# Patient Record
Sex: Female | Born: 1965 | Race: Black or African American | Hispanic: No | Marital: Single | Smoking: Former smoker
Health system: Southern US, Community
[De-identification: ages and names within clinical notes are randomized; demographics above are authoritative.]

## PROBLEM LIST (undated history)

## (undated) DIAGNOSIS — C719 Malignant neoplasm of brain, unspecified: Secondary | ICD-10-CM

## (undated) DIAGNOSIS — Z9221 Personal history of antineoplastic chemotherapy: Secondary | ICD-10-CM

## (undated) DIAGNOSIS — N390 Urinary tract infection, site not specified: Secondary | ICD-10-CM

## (undated) DIAGNOSIS — C50919 Malignant neoplasm of unspecified site of unspecified female breast: Principal | ICD-10-CM

## (undated) DIAGNOSIS — Z515 Encounter for palliative care: Secondary | ICD-10-CM

## (undated) DIAGNOSIS — C7951 Secondary malignant neoplasm of bone: Secondary | ICD-10-CM

## (undated) DIAGNOSIS — Z923 Personal history of irradiation: Secondary | ICD-10-CM

## (undated) DIAGNOSIS — N12 Tubulo-interstitial nephritis, not specified as acute or chronic: Secondary | ICD-10-CM

## (undated) DIAGNOSIS — K219 Gastro-esophageal reflux disease without esophagitis: Secondary | ICD-10-CM

## (undated) DIAGNOSIS — I10 Essential (primary) hypertension: Secondary | ICD-10-CM

## (undated) DIAGNOSIS — C787 Secondary malignant neoplasm of liver and intrahepatic bile duct: Principal | ICD-10-CM

## (undated) HISTORY — DX: Personal history of antineoplastic chemotherapy: Z92.21

## (undated) HISTORY — DX: Hypercalcemia: E83.52

## (undated) HISTORY — DX: Malignant neoplasm of unspecified site of unspecified female breast: C50.919

## (undated) HISTORY — DX: Malignant neoplasm of brain, unspecified: C71.9

## (undated) HISTORY — DX: Secondary malignant neoplasm of liver and intrahepatic bile duct: C78.7

## (undated) HISTORY — DX: Personal history of irradiation: Z92.3

## (undated) HISTORY — DX: Essential (primary) hypertension: I10

---

## 2001-05-29 ENCOUNTER — Emergency Department (HOSPITAL_COMMUNITY): Admission: EM | Admit: 2001-05-29 | Discharge: 2001-05-29 | Payer: Self-pay | Admitting: Emergency Medicine

## 2002-06-02 ENCOUNTER — Emergency Department (HOSPITAL_COMMUNITY): Admission: EM | Admit: 2002-06-02 | Discharge: 2002-06-02 | Payer: Self-pay | Admitting: Emergency Medicine

## 2002-06-02 ENCOUNTER — Encounter: Payer: Self-pay | Admitting: Emergency Medicine

## 2002-11-15 ENCOUNTER — Emergency Department (HOSPITAL_COMMUNITY): Admission: EM | Admit: 2002-11-15 | Discharge: 2002-11-16 | Payer: Self-pay | Admitting: Emergency Medicine

## 2003-08-09 ENCOUNTER — Emergency Department (HOSPITAL_COMMUNITY): Admission: EM | Admit: 2003-08-09 | Discharge: 2003-08-09 | Payer: Self-pay | Admitting: Family Medicine

## 2005-08-25 ENCOUNTER — Emergency Department (HOSPITAL_COMMUNITY): Admission: EM | Admit: 2005-08-25 | Discharge: 2005-08-25 | Payer: Self-pay | Admitting: Emergency Medicine

## 2007-11-05 ENCOUNTER — Inpatient Hospital Stay (HOSPITAL_COMMUNITY): Admission: AD | Admit: 2007-11-05 | Discharge: 2007-11-06 | Payer: Self-pay | Admitting: Obstetrics and Gynecology

## 2008-05-17 ENCOUNTER — Encounter (INDEPENDENT_AMBULATORY_CARE_PROVIDER_SITE_OTHER): Payer: Self-pay | Admitting: Obstetrics

## 2008-05-17 ENCOUNTER — Inpatient Hospital Stay (HOSPITAL_COMMUNITY): Admission: AD | Admit: 2008-05-17 | Discharge: 2008-05-20 | Payer: Self-pay | Admitting: Obstetrics

## 2008-06-01 HISTORY — PX: TUBAL LIGATION: SHX77

## 2008-08-28 ENCOUNTER — Emergency Department (HOSPITAL_COMMUNITY): Admission: EM | Admit: 2008-08-28 | Discharge: 2008-08-28 | Payer: Self-pay | Admitting: Family Medicine

## 2008-12-21 ENCOUNTER — Emergency Department (HOSPITAL_COMMUNITY): Admission: EM | Admit: 2008-12-21 | Discharge: 2008-12-21 | Payer: Self-pay | Admitting: Emergency Medicine

## 2008-12-24 ENCOUNTER — Emergency Department (HOSPITAL_COMMUNITY): Admission: EM | Admit: 2008-12-24 | Discharge: 2008-12-24 | Payer: Self-pay | Admitting: Emergency Medicine

## 2009-02-03 ENCOUNTER — Emergency Department (HOSPITAL_COMMUNITY): Admission: EM | Admit: 2009-02-03 | Discharge: 2009-02-03 | Payer: Self-pay | Admitting: Emergency Medicine

## 2009-02-27 ENCOUNTER — Emergency Department (HOSPITAL_COMMUNITY): Admission: EM | Admit: 2009-02-27 | Discharge: 2009-02-27 | Payer: Self-pay | Admitting: Family Medicine

## 2009-03-08 ENCOUNTER — Encounter: Admission: RE | Admit: 2009-03-08 | Discharge: 2009-03-08 | Payer: Self-pay | Admitting: Infectious Diseases

## 2009-03-14 ENCOUNTER — Encounter: Admission: RE | Admit: 2009-03-14 | Discharge: 2009-03-14 | Payer: Self-pay | Admitting: Infectious Diseases

## 2009-03-14 ENCOUNTER — Encounter (INDEPENDENT_AMBULATORY_CARE_PROVIDER_SITE_OTHER): Payer: Self-pay | Admitting: Diagnostic Radiology

## 2009-03-22 ENCOUNTER — Encounter: Admission: RE | Admit: 2009-03-22 | Discharge: 2009-03-22 | Payer: Self-pay | Admitting: Infectious Diseases

## 2009-03-28 ENCOUNTER — Ambulatory Visit: Payer: Self-pay | Admitting: Genetic Counselor

## 2009-04-29 ENCOUNTER — Encounter (INDEPENDENT_AMBULATORY_CARE_PROVIDER_SITE_OTHER): Payer: Self-pay | Admitting: General Surgery

## 2009-04-29 ENCOUNTER — Ambulatory Visit (HOSPITAL_COMMUNITY): Admission: RE | Admit: 2009-04-29 | Discharge: 2009-04-30 | Payer: Self-pay | Admitting: General Surgery

## 2009-05-13 ENCOUNTER — Ambulatory Visit: Payer: Self-pay | Admitting: Genetic Counselor

## 2009-06-01 HISTORY — PX: MASTECTOMY: SHX3

## 2009-06-01 HISTORY — PX: PORTACATH PLACEMENT: SHX2246

## 2009-06-01 HISTORY — PX: BREAST BIOPSY: SHX20

## 2009-06-12 ENCOUNTER — Ambulatory Visit: Payer: Self-pay | Admitting: Oncology

## 2009-06-12 LAB — COMPREHENSIVE METABOLIC PANEL
AST: 18 U/L (ref 0–37)
Albumin: 3.9 g/dL (ref 3.5–5.2)
Alkaline Phosphatase: 92 U/L (ref 39–117)
Chloride: 106 mEq/L (ref 96–112)
Glucose, Bld: 82 mg/dL (ref 70–99)
Potassium: 3.3 mEq/L — ABNORMAL LOW (ref 3.5–5.3)
Total Bilirubin: 0.7 mg/dL (ref 0.3–1.2)
Total Protein: 6.9 g/dL (ref 6.0–8.3)

## 2009-06-12 LAB — CBC WITH DIFFERENTIAL/PLATELET
BASO%: 0.4 % (ref 0.0–2.0)
Eosinophils Absolute: 0.1 10*3/uL (ref 0.0–0.5)
LYMPH%: 44.3 % (ref 14.0–49.7)
MCH: 27.6 pg (ref 25.1–34.0)
MCHC: 33.3 g/dL (ref 31.5–36.0)
MONO%: 7.4 % (ref 0.0–14.0)
RBC: 4.38 10*6/uL (ref 3.70–5.45)

## 2009-06-24 ENCOUNTER — Ambulatory Visit (HOSPITAL_COMMUNITY): Admission: RE | Admit: 2009-06-24 | Discharge: 2009-06-24 | Payer: Self-pay | Admitting: Oncology

## 2009-06-25 ENCOUNTER — Encounter: Payer: Self-pay | Admitting: Oncology

## 2009-06-25 ENCOUNTER — Ambulatory Visit: Admission: RE | Admit: 2009-06-25 | Discharge: 2009-06-25 | Payer: Self-pay | Admitting: Oncology

## 2009-07-17 ENCOUNTER — Encounter (INDEPENDENT_AMBULATORY_CARE_PROVIDER_SITE_OTHER): Payer: Self-pay | Admitting: General Surgery

## 2009-07-17 ENCOUNTER — Ambulatory Visit (HOSPITAL_COMMUNITY): Admission: RE | Admit: 2009-07-17 | Discharge: 2009-07-18 | Payer: Self-pay | Admitting: General Surgery

## 2009-07-26 ENCOUNTER — Ambulatory Visit: Payer: Self-pay | Admitting: Oncology

## 2009-10-15 ENCOUNTER — Ambulatory Visit: Payer: Self-pay | Admitting: Oncology

## 2009-11-29 ENCOUNTER — Ambulatory Visit: Payer: Self-pay | Admitting: Oncology

## 2009-11-29 LAB — CBC WITH DIFFERENTIAL/PLATELET
BASO%: 0.5 % (ref 0.0–2.0)
EOS%: 1.1 % (ref 0.0–7.0)
MCH: 27.7 pg (ref 25.1–34.0)
MCHC: 33.6 g/dL (ref 31.5–36.0)
MCV: 82.4 fL (ref 79.5–101.0)
NEUT#: 4.1 10*3/uL (ref 1.5–6.5)
RBC: 4.08 10*6/uL (ref 3.70–5.45)
RDW: 16 % — ABNORMAL HIGH (ref 11.2–14.5)

## 2009-11-29 LAB — CANCER ANTIGEN 27.29: CA 27.29: 9 U/mL (ref 0–39)

## 2009-11-29 LAB — COMPREHENSIVE METABOLIC PANEL
ALT: 17 U/L (ref 0–35)
Albumin: 4.2 g/dL (ref 3.5–5.2)
CO2: 19 mEq/L (ref 19–32)
Glucose, Bld: 103 mg/dL — ABNORMAL HIGH (ref 70–99)
Potassium: 3.5 mEq/L (ref 3.5–5.3)
Sodium: 138 mEq/L (ref 135–145)
Total Protein: 6.9 g/dL (ref 6.0–8.3)

## 2009-12-09 ENCOUNTER — Encounter: Admission: RE | Admit: 2009-12-09 | Discharge: 2010-02-13 | Payer: Self-pay | Admitting: Oncology

## 2009-12-10 LAB — CBC WITH DIFFERENTIAL/PLATELET
BASO%: 1.1 % (ref 0.0–2.0)
EOS%: 0.6 % (ref 0.0–7.0)
HCT: 36.2 % (ref 34.8–46.6)
LYMPH%: 21.1 % (ref 14.0–49.7)
MCH: 27.2 pg (ref 25.1–34.0)
MCHC: 32.5 g/dL (ref 31.5–36.0)
MONO#: 0.2 10*3/uL (ref 0.1–0.9)
NEUT%: 73.5 % (ref 38.4–76.8)
Platelets: 362 10*3/uL (ref 145–400)
RBC: 4.33 10*6/uL (ref 3.70–5.45)
WBC: 6.3 10*3/uL (ref 3.9–10.3)
lymph#: 1.3 10*3/uL (ref 0.9–3.3)

## 2009-12-10 LAB — COMPREHENSIVE METABOLIC PANEL
ALT: 8 U/L (ref 0–35)
AST: 14 U/L (ref 0–37)
CO2: 24 mEq/L (ref 19–32)
Creatinine, Ser: 0.79 mg/dL (ref 0.40–1.20)
Sodium: 141 mEq/L (ref 135–145)
Total Bilirubin: 0.4 mg/dL (ref 0.3–1.2)
Total Protein: 7.4 g/dL (ref 6.0–8.3)

## 2009-12-17 LAB — CBC WITH DIFFERENTIAL/PLATELET
BASO%: 0.1 % (ref 0.0–2.0)
Basophils Absolute: 0 10*3/uL (ref 0.0–0.1)
EOS%: 0.1 % (ref 0.0–7.0)
HCT: 32 % — ABNORMAL LOW (ref 34.8–46.6)
HGB: 10.9 g/dL — ABNORMAL LOW (ref 11.6–15.9)
LYMPH%: 7.7 % — ABNORMAL LOW (ref 14.0–49.7)
MCH: 28.2 pg (ref 25.1–34.0)
MCHC: 34.1 g/dL (ref 31.5–36.0)
MCV: 82.5 fL (ref 79.5–101.0)
MONO%: 1 % (ref 0.0–14.0)
NEUT%: 91.1 % — ABNORMAL HIGH (ref 38.4–76.8)
Platelets: 215 10*3/uL (ref 145–400)

## 2009-12-23 LAB — CBC WITH DIFFERENTIAL/PLATELET
BASO%: 0.4 % (ref 0.0–2.0)
Basophils Absolute: 0 10*3/uL (ref 0.0–0.1)
EOS%: 0.3 % (ref 0.0–7.0)
HGB: 11.5 g/dL — ABNORMAL LOW (ref 11.6–15.9)
MCH: 27.9 pg (ref 25.1–34.0)
MCHC: 33.7 g/dL (ref 31.5–36.0)
MCV: 82.7 fL (ref 79.5–101.0)
MONO%: 3.4 % (ref 0.0–14.0)
RBC: 4.14 10*6/uL (ref 3.70–5.45)
RDW: 16.1 % — ABNORMAL HIGH (ref 11.2–14.5)
lymph#: 1.4 10*3/uL (ref 0.9–3.3)

## 2009-12-30 ENCOUNTER — Ambulatory Visit: Payer: Self-pay | Admitting: Oncology

## 2010-01-01 LAB — CBC WITH DIFFERENTIAL/PLATELET
Basophils Absolute: 0 10*3/uL (ref 0.0–0.1)
EOS%: 0.3 % (ref 0.0–7.0)
MONO#: 0.3 10*3/uL (ref 0.1–0.9)
NEUT#: 1.9 10*3/uL (ref 1.5–6.5)
NEUT%: 56.9 % (ref 38.4–76.8)
RBC: 3.76 10*6/uL (ref 3.70–5.45)
RDW: 16.9 % — ABNORMAL HIGH (ref 11.2–14.5)
lymph#: 1.2 10*3/uL (ref 0.9–3.3)

## 2010-01-06 ENCOUNTER — Ambulatory Visit (HOSPITAL_COMMUNITY): Admission: RE | Admit: 2010-01-06 | Discharge: 2010-01-06 | Payer: Self-pay | Admitting: Oncology

## 2010-01-07 LAB — CBC WITH DIFFERENTIAL/PLATELET
Eosinophils Absolute: 0 10*3/uL (ref 0.0–0.5)
HCT: 32.3 % — ABNORMAL LOW (ref 34.8–46.6)
MCH: 27 pg (ref 25.1–34.0)
MCV: 82.2 fL (ref 79.5–101.0)
NEUT#: 13.7 10*3/uL — ABNORMAL HIGH (ref 1.5–6.5)
Platelets: 202 10*3/uL (ref 145–400)
WBC: 16.4 10*3/uL — ABNORMAL HIGH (ref 3.9–10.3)

## 2010-01-15 LAB — URINALYSIS, MICROSCOPIC - CHCC
Bilirubin (Urine): NEGATIVE
Glucose: NEGATIVE g/dL
Specific Gravity, Urine: 1.01 (ref 1.003–1.035)

## 2010-01-15 LAB — CBC WITH DIFFERENTIAL/PLATELET
BASO%: 0.4 % (ref 0.0–2.0)
EOS%: 0.4 % (ref 0.0–7.0)
HGB: 10.9 g/dL — ABNORMAL LOW (ref 11.6–15.9)
LYMPH%: 19.4 % (ref 14.0–49.7)
MCH: 28.1 pg (ref 25.1–34.0)
NEUT#: 5.2 10*3/uL (ref 1.5–6.5)
Platelets: 417 10*3/uL — ABNORMAL HIGH (ref 145–400)
RDW: 20.1 % — ABNORMAL HIGH (ref 11.2–14.5)
WBC: 7.4 10*3/uL (ref 3.9–10.3)
lymph#: 1.4 10*3/uL (ref 0.9–3.3)

## 2010-01-17 LAB — URINE CULTURE

## 2010-01-28 LAB — CBC WITH DIFFERENTIAL/PLATELET
Basophils Absolute: 0 10*3/uL (ref 0.0–0.1)
Eosinophils Absolute: 0.1 10*3/uL (ref 0.0–0.5)
HGB: 11.3 g/dL — ABNORMAL LOW (ref 11.6–15.9)
LYMPH%: 22.5 % (ref 14.0–49.7)
MCH: 27.5 pg (ref 25.1–34.0)
MCV: 83.5 fL (ref 79.5–101.0)
MONO#: 0.6 10*3/uL (ref 0.1–0.9)
NEUT#: 3.7 10*3/uL (ref 1.5–6.5)
NEUT%: 65.7 % (ref 38.4–76.8)
WBC: 5.6 10*3/uL (ref 3.9–10.3)

## 2010-02-04 ENCOUNTER — Ambulatory Visit: Payer: Self-pay | Admitting: Oncology

## 2010-02-11 LAB — COMPREHENSIVE METABOLIC PANEL
Alkaline Phosphatase: 74 U/L (ref 39–117)
Calcium: 8.8 mg/dL (ref 8.4–10.5)
Creatinine, Ser: 0.77 mg/dL (ref 0.40–1.20)
Potassium: 3.2 mEq/L — ABNORMAL LOW (ref 3.5–5.3)
Sodium: 142 mEq/L (ref 135–145)
Total Bilirubin: 0.3 mg/dL (ref 0.3–1.2)
Total Protein: 6.3 g/dL (ref 6.0–8.3)

## 2010-02-11 LAB — CBC WITH DIFFERENTIAL/PLATELET
EOS%: 0.9 % (ref 0.0–7.0)
Eosinophils Absolute: 0 10*3/uL (ref 0.0–0.5)
MONO%: 6.7 % (ref 0.0–14.0)
NEUT#: 3.1 10*3/uL (ref 1.5–6.5)

## 2010-02-18 LAB — CBC WITH DIFFERENTIAL/PLATELET
BASO%: 1.9 % (ref 0.0–2.0)
EOS%: 1.3 % (ref 0.0–7.0)
Eosinophils Absolute: 0 10*3/uL (ref 0.0–0.5)
HGB: 10.6 g/dL — ABNORMAL LOW (ref 11.6–15.9)
MCH: 28 pg (ref 25.1–34.0)
MCHC: 33 g/dL (ref 31.5–36.0)
MCV: 84.9 fL (ref 79.5–101.0)
MONO#: 0.2 10*3/uL (ref 0.1–0.9)
NEUT#: 2 10*3/uL (ref 1.5–6.5)
NEUT%: 62.3 % (ref 38.4–76.8)
RDW: 19.3 % — ABNORMAL HIGH (ref 11.2–14.5)
nRBC: 0 % (ref 0–0)

## 2010-02-25 LAB — CBC WITH DIFFERENTIAL/PLATELET
BASO%: 0.5 % (ref 0.0–2.0)
HCT: 33.4 % — ABNORMAL LOW (ref 34.8–46.6)
HGB: 11.2 g/dL — ABNORMAL LOW (ref 11.6–15.9)
LYMPH%: 13.8 % — ABNORMAL LOW (ref 14.0–49.7)
MCH: 28.4 pg (ref 25.1–34.0)
MCHC: 33.5 g/dL (ref 31.5–36.0)
NEUT#: 5.9 10*3/uL (ref 1.5–6.5)
Platelets: 222 10*3/uL (ref 145–400)
RBC: 3.95 10*6/uL (ref 3.70–5.45)
nRBC: 1 % — ABNORMAL HIGH (ref 0–0)

## 2010-03-07 ENCOUNTER — Ambulatory Visit: Payer: Self-pay | Admitting: Oncology

## 2010-03-18 LAB — CBC WITH DIFFERENTIAL/PLATELET
BASO%: 0.6 % (ref 0.0–2.0)
HGB: 11.5 g/dL — ABNORMAL LOW (ref 11.6–15.9)
MONO#: 0.4 10*3/uL (ref 0.1–0.9)
NEUT#: 2.8 10*3/uL (ref 1.5–6.5)
NEUT%: 58.2 % (ref 38.4–76.8)
Platelets: 343 10*3/uL (ref 145–400)
RBC: 4.07 10*6/uL (ref 3.70–5.45)
RDW: 19.1 % — ABNORMAL HIGH (ref 11.2–14.5)
lymph#: 1.5 10*3/uL (ref 0.9–3.3)

## 2010-03-25 LAB — CBC WITH DIFFERENTIAL/PLATELET
HGB: 11.1 g/dL — ABNORMAL LOW (ref 11.6–15.9)
LYMPH%: 26.1 % (ref 14.0–49.7)
MCH: 28.6 pg (ref 25.1–34.0)
MCHC: 33 g/dL (ref 31.5–36.0)
MCV: 86.6 fL (ref 79.5–101.0)
MONO#: 0.3 10*3/uL (ref 0.1–0.9)
MONO%: 6.7 % (ref 0.0–14.0)
Platelets: 269 10*3/uL (ref 145–400)
RDW: 18.2 % — ABNORMAL HIGH (ref 11.2–14.5)
nRBC: 0 % (ref 0–0)

## 2010-04-01 LAB — CBC WITH DIFFERENTIAL/PLATELET
EOS%: 1.8 % (ref 0.0–7.0)
Eosinophils Absolute: 0.1 10*3/uL (ref 0.0–0.5)
MCH: 28.8 pg (ref 25.1–34.0)
MCV: 86.7 fL (ref 79.5–101.0)
MONO%: 5.2 % (ref 0.0–14.0)
NEUT#: 2.2 10*3/uL (ref 1.5–6.5)
RBC: 3.92 10*6/uL (ref 3.70–5.45)
RDW: 18.2 % — ABNORMAL HIGH (ref 11.2–14.5)
lymph#: 1.4 10*3/uL (ref 0.9–3.3)
nRBC: 0 % (ref 0–0)

## 2010-04-08 ENCOUNTER — Ambulatory Visit: Payer: Self-pay | Admitting: Oncology

## 2010-04-08 LAB — CBC WITH DIFFERENTIAL/PLATELET
BASO%: 0.9 % (ref 0.0–2.0)
MCHC: 32.9 g/dL (ref 31.5–36.0)
MONO#: 0.2 10*3/uL (ref 0.1–0.9)
NEUT#: 2.1 10*3/uL (ref 1.5–6.5)
RBC: 4 10*6/uL (ref 3.70–5.45)
WBC: 3.4 10*3/uL — ABNORMAL LOW (ref 3.9–10.3)
lymph#: 1 10*3/uL (ref 0.9–3.3)
nRBC: 0 % (ref 0–0)

## 2010-04-15 LAB — CBC WITH DIFFERENTIAL/PLATELET
Basophils Absolute: 0 10*3/uL (ref 0.0–0.1)
EOS%: 0.2 % (ref 0.0–7.0)
Eosinophils Absolute: 0 10*3/uL (ref 0.0–0.5)
HGB: 11.9 g/dL (ref 11.6–15.9)
MONO#: 0.5 10*3/uL (ref 0.1–0.9)
NEUT#: 3.4 10*3/uL (ref 1.5–6.5)
RDW: 16.4 % — ABNORMAL HIGH (ref 11.2–14.5)
WBC: 5.1 10*3/uL (ref 3.9–10.3)
lymph#: 1.2 10*3/uL (ref 0.9–3.3)

## 2010-04-15 LAB — URINALYSIS, MICROSCOPIC - CHCC
Nitrite: NEGATIVE
Protein: 30 mg/dL
Specific Gravity, Urine: 1.01 (ref 1.003–1.035)
pH: 6.5 (ref 4.6–8.0)

## 2010-05-06 ENCOUNTER — Ambulatory Visit
Admission: RE | Admit: 2010-05-06 | Discharge: 2010-07-01 | Payer: Self-pay | Source: Home / Self Care | Attending: Radiation Oncology | Admitting: Radiation Oncology

## 2010-06-01 DIAGNOSIS — C7951 Secondary malignant neoplasm of bone: Secondary | ICD-10-CM

## 2010-06-01 HISTORY — DX: Secondary malignant neoplasm of bone: C79.51

## 2010-06-01 HISTORY — PX: PORT-A-CATH REMOVAL: SHX5289

## 2010-06-01 HISTORY — PX: BACK SURGERY: SHX140

## 2010-06-13 ENCOUNTER — Ambulatory Visit (HOSPITAL_BASED_OUTPATIENT_CLINIC_OR_DEPARTMENT_OTHER): Payer: Self-pay | Admitting: Oncology

## 2010-06-22 ENCOUNTER — Encounter: Payer: Self-pay | Admitting: Oncology

## 2010-06-22 ENCOUNTER — Encounter: Payer: Self-pay | Admitting: General Surgery

## 2010-06-23 ENCOUNTER — Encounter: Payer: Self-pay | Admitting: Infectious Diseases

## 2010-07-01 DIAGNOSIS — Z170 Estrogen receptor positive status [ER+]: Secondary | ICD-10-CM

## 2010-07-01 DIAGNOSIS — C50519 Malignant neoplasm of lower-outer quadrant of unspecified female breast: Secondary | ICD-10-CM

## 2010-07-03 ENCOUNTER — Ambulatory Visit: Payer: Self-pay | Attending: Radiation Oncology | Admitting: Radiation Oncology

## 2010-07-03 DIAGNOSIS — C50519 Malignant neoplasm of lower-outer quadrant of unspecified female breast: Secondary | ICD-10-CM | POA: Insufficient documentation

## 2010-07-03 DIAGNOSIS — Z510 Encounter for antineoplastic radiation therapy: Secondary | ICD-10-CM | POA: Insufficient documentation

## 2010-07-17 ENCOUNTER — Inpatient Hospital Stay (INDEPENDENT_AMBULATORY_CARE_PROVIDER_SITE_OTHER)
Admission: RE | Admit: 2010-07-17 | Discharge: 2010-07-17 | Disposition: A | Discharge status: Home / Self Care | Payer: Self-pay | Source: Ambulatory Visit | Attending: Family Medicine | Admitting: Family Medicine

## 2010-07-17 DIAGNOSIS — IMO0002 Reserved for concepts with insufficient information to code with codable children: Secondary | ICD-10-CM

## 2010-07-20 LAB — CULTURE, ROUTINE-ABSCESS: Gram Stain: NONE SEEN

## 2010-07-27 ENCOUNTER — Inpatient Hospital Stay (INDEPENDENT_AMBULATORY_CARE_PROVIDER_SITE_OTHER)
Admission: RE | Admit: 2010-07-27 | Discharge: 2010-07-27 | Disposition: A | Discharge status: Home / Self Care | Payer: Self-pay | Source: Ambulatory Visit | Attending: Emergency Medicine | Admitting: Emergency Medicine

## 2010-07-27 DIAGNOSIS — N390 Urinary tract infection, site not specified: Secondary | ICD-10-CM

## 2010-07-28 LAB — POCT URINALYSIS DIPSTICK
Ketones, ur: NEGATIVE mg/dL
Specific Gravity, Urine: 1.01 (ref 1.005–1.030)
pH: 5.5 (ref 5.0–8.0)

## 2010-08-21 LAB — URINE CULTURE
Colony Count: NO GROWTH
Special Requests: NEGATIVE

## 2010-08-21 LAB — CBC
MCV: 83.5 fL (ref 78.0–100.0)
RBC: 4.36 MIL/uL (ref 3.87–5.11)
WBC: 5.1 10*3/uL (ref 4.0–10.5)

## 2010-08-21 LAB — PREGNANCY, URINE: Preg Test, Ur: NEGATIVE

## 2010-08-21 LAB — URINALYSIS, ROUTINE W REFLEX MICROSCOPIC
Bilirubin Urine: NEGATIVE
Nitrite: NEGATIVE
Specific Gravity, Urine: 1.014 (ref 1.005–1.030)
Urobilinogen, UA: 0.2 mg/dL (ref 0.0–1.0)
pH: 6 (ref 5.0–8.0)

## 2010-08-24 ENCOUNTER — Emergency Department (HOSPITAL_COMMUNITY)
Admission: EM | Admit: 2010-08-24 | Discharge: 2010-08-24 | Disposition: A | Discharge status: Home / Self Care | Payer: Self-pay | Attending: Emergency Medicine | Admitting: Emergency Medicine

## 2010-08-24 DIAGNOSIS — I10 Essential (primary) hypertension: Secondary | ICD-10-CM | POA: Insufficient documentation

## 2010-08-24 DIAGNOSIS — K089 Disorder of teeth and supporting structures, unspecified: Secondary | ICD-10-CM | POA: Insufficient documentation

## 2010-08-24 DIAGNOSIS — Z853 Personal history of malignant neoplasm of breast: Secondary | ICD-10-CM | POA: Insufficient documentation

## 2010-08-24 DIAGNOSIS — K029 Dental caries, unspecified: Secondary | ICD-10-CM | POA: Insufficient documentation

## 2010-09-03 LAB — COMPREHENSIVE METABOLIC PANEL
ALT: 12 U/L (ref 0–35)
AST: 21 U/L (ref 0–37)
CO2: 29 mEq/L (ref 19–32)
Calcium: 8.8 mg/dL (ref 8.4–10.5)
GFR calc Af Amer: >60 mL/min (ref >60)
Potassium: 3.2 mEq/L — ABNORMAL LOW (ref 3.5–5.1)
Sodium: 139 mEq/L (ref 135–145)
Total Protein: 6.6 g/dL (ref 6.0–8.3)

## 2010-09-03 LAB — URINALYSIS, ROUTINE W REFLEX MICROSCOPIC
Glucose, UA: NEGATIVE mg/dL
Ketones, ur: NEGATIVE mg/dL
pH: 7 (ref 5.0–8.0)

## 2010-09-03 LAB — DIFFERENTIAL
Eosinophils Absolute: 0 10*3/uL (ref 0.0–0.7)
Eosinophils Relative: 1 % (ref 0–5)
Lymphs Abs: 1.9 10*3/uL (ref 0.7–4.0)
Monocytes Relative: 7 % (ref 3–12)

## 2010-09-03 LAB — CBC
MCHC: 34.3 g/dL (ref 30.0–36.0)
RBC: 4.07 MIL/uL (ref 3.87–5.11)
RDW: 15.7 % — ABNORMAL HIGH (ref 11.5–15.5)

## 2010-09-03 LAB — URINE MICROSCOPIC-ADD ON

## 2010-09-03 LAB — CANCER ANTIGEN 27.29: CA 27.29: 10 U/mL (ref 0–39)

## 2010-09-05 LAB — DIFFERENTIAL
Lymphocytes Relative: 28 % (ref 12–46)
Lymphs Abs: 2 10*3/uL (ref 0.7–4.0)
Neutro Abs: 4.5 10*3/uL (ref 1.7–7.7)
Neutrophils Relative %: 64 % (ref 43–77)

## 2010-09-05 LAB — COMPREHENSIVE METABOLIC PANEL
AST: 24 U/L (ref 0–37)
BUN: 11 mg/dL (ref 6–23)
CO2: 27 mEq/L (ref 19–32)
Calcium: 9.1 mg/dL (ref 8.4–10.5)
Creatinine, Ser: 0.67 mg/dL (ref 0.4–1.2)
GFR calc Af Amer: >60 mL/min (ref >60)
GFR calc non Af Amer: >60 mL/min (ref >60)
Glucose, Bld: 95 mg/dL (ref 70–99)

## 2010-09-05 LAB — URINALYSIS, ROUTINE W REFLEX MICROSCOPIC
Bilirubin Urine: NEGATIVE
Nitrite: NEGATIVE
Specific Gravity, Urine: 1.012 (ref 1.005–1.030)
Urobilinogen, UA: 0.2 mg/dL (ref 0.0–1.0)

## 2010-09-05 LAB — CBC
MCHC: 33.8 g/dL (ref 30.0–36.0)
MCV: 86.3 fL (ref 78.0–100.0)
RBC: 4.16 MIL/uL (ref 3.87–5.11)

## 2010-09-05 LAB — WET PREP, GENITAL
WBC, Wet Prep HPF POC: NONE SEEN
Yeast Wet Prep HPF POC: NONE SEEN

## 2010-09-05 LAB — POCT I-STAT, CHEM 8
BUN: 13 mg/dL (ref 6–23)
Chloride: 105 mEq/L (ref 96–112)
Creatinine, Ser: 0.6 mg/dL (ref 0.4–1.2)
Potassium: 3.6 mEq/L (ref 3.5–5.1)
Sodium: 139 mEq/L (ref 135–145)

## 2010-09-05 LAB — URINE MICROSCOPIC-ADD ON

## 2010-09-05 LAB — POCT URINALYSIS DIP (DEVICE)
Protein, ur: 30 mg/dL — AB
Specific Gravity, Urine: 1.015 (ref 1.005–1.030)
Urobilinogen, UA: 0.2 mg/dL (ref 0.0–1.0)
pH: 6 (ref 5.0–8.0)

## 2010-09-05 LAB — GC/CHLAMYDIA PROBE AMP, GENITAL
Chlamydia, DNA Probe: NEGATIVE
GC Probe Amp, Genital: NEGATIVE

## 2010-10-13 ENCOUNTER — Encounter (HOSPITAL_BASED_OUTPATIENT_CLINIC_OR_DEPARTMENT_OTHER): Payer: Self-pay | Admitting: Oncology

## 2010-10-13 ENCOUNTER — Other Ambulatory Visit: Payer: Self-pay | Admitting: Oncology

## 2010-10-13 DIAGNOSIS — Z170 Estrogen receptor positive status [ER+]: Secondary | ICD-10-CM

## 2010-10-13 DIAGNOSIS — C50519 Malignant neoplasm of lower-outer quadrant of unspecified female breast: Secondary | ICD-10-CM

## 2010-10-13 LAB — CBC WITH DIFFERENTIAL/PLATELET
BASO%: 0.5 % (ref 0.0–2.0)
Basophils Absolute: 0 10e3/uL (ref 0.0–0.1)
EOS%: 0.7 % (ref 0.0–7.0)
Eosinophils Absolute: 0 10e3/uL (ref 0.0–0.5)
HCT: 38.2 % (ref 34.8–46.6)
HGB: 12.9 g/dL (ref 11.6–15.9)
LYMPH%: 26.5 % (ref 14.0–49.7)
MCH: 30.4 pg (ref 25.1–34.0)
MCHC: 33.9 g/dL (ref 31.5–36.0)
MCV: 89.8 fL (ref 79.5–101.0)
MONO#: 0.3 10e3/uL (ref 0.1–0.9)
MONO%: 8.1 % (ref 0.0–14.0)
NEUT#: 2.1 10e3/uL (ref 1.5–6.5)
NEUT%: 64.2 % (ref 38.4–76.8)
Platelets: 241 10e3/uL (ref 145–400)
RBC: 4.25 10e6/uL (ref 3.70–5.45)
RDW: 15 % — ABNORMAL HIGH (ref 11.2–14.5)
WBC: 3.3 10e3/uL — ABNORMAL LOW (ref 3.9–10.3)
lymph#: 0.9 10e3/uL (ref 0.9–3.3)

## 2010-10-13 LAB — COMPREHENSIVE METABOLIC PANEL
ALT: <8 U/L (ref 0–35)
AST: 16 U/L (ref 0–37)
Alkaline Phosphatase: 100 U/L (ref 39–117)
BUN: 8 mg/dL (ref 6–23)
Calcium: 9.2 mg/dL (ref 8.4–10.5)
Chloride: 109 mEq/L (ref 96–112)
Creatinine, Ser: 0.86 mg/dL (ref 0.40–1.20)
Potassium: 3.7 mEq/L (ref 3.5–5.3)

## 2010-10-14 NOTE — Op Note (Signed)
NAMECHRISTNE, [name] NO.:  [id number]   MEDICAL RECORD NO.:  [id number]          PATIENT TYPE:  INP   LOCATION:  9373                          FACILITY:  [facility]   PHYSICIAN:  Kathreen Cosier, M.D.DATE OF BIRTH:  1966-01-17   DATE OF PROCEDURE:  05/17/2008  DATE OF DISCHARGE:                               OPERATIVE REPORT   PREOPERATIVE DIAGNOSES:  Intrauterine pregnancy 37 weeks with  preeclampsia, previous cesarean section, multiparity, desiring  sterilization.   POSTOPERATIVE DIAGNOSES:  Intrauterine pregnancy 37 weeks with  preeclampsia, previous cesarean section, multiparity, desiring  sterilization.   SURGEON:  Kathreen Cosier, MD   FIRST ASSISTANT:  Charles A. Clearance Coots, MD   PROCEDURE:  The patient was placed on the operating table in supine  position after the spinal administered.  Abdomen prepped and draped.  Bladder emptied with Foley catheter.  Transverse suprapubic incision  made through the old scar, carried down to rectus fascia.  Fascia  cleaned and incised to the length of incision.  Recti muscles retracted  laterally.  Transverse incision made in the visceral peritoneum above  the bladder.  Bladder mobilized inferiorly.  Transverse lower uterine  incision was made.  The patient delivered from the LOA position of a  female Apgar 9 and 9, weighing 4 pounds 12 ounces.  The fluid was clear.  The team was in attendance.  The placenta was posterior fundal and  removed manually and sent to pathology.  Uterine cavity cleaned with dry  laps.  The uterine incision was closed in two layers with continuous  suture of #1 chromic.  Hemostasis satisfactory.  Bladder flap reattached  with 2-0 chromic.  The right tube grasped in midportion with a Babcock  clamp.  Zero plain suture placed in the mesosalpinx below the portion of  tube within the clamp and this was then tied approximately 1 inch of  tube transected.  Procedure done in a similar fashion in the  other side.  The uterus was enlarged with multiple myomas.  Uterus was 24 weeks' size  postop.  She had fundal myomas and intramural myomas.  Lap and sponge  counts correct.  Abdomen closed in layers, peritoneum continuous suture  of 0 chromic, fascia continuous suture of 0 Dexon and the skin closed  with subcuticular stitch of 4-0 Monocryl.  Blood loss 800 mL.  The  patient tolerated the procedure well, taken to recovery room in good  condition.           ______________________________  Kathreen Cosier, M.D.     BAM/MEDQ  D:  05/17/2008  T:  05/18/2008  Job:  [id number]

## 2010-10-14 NOTE — H&P (Signed)
NAMEYIDES, SAIDI NO.:  [id number]   MEDICAL RECORD NO.:  [id number]          PATIENT TYPE:  INP   LOCATION:  9373                          FACILITY:  [facility]   PHYSICIAN:  Kathreen Cosier, M.D.DATE OF BIRTH:  09/03/1965   DATE OF ADMISSION:  05/17/2008  DATE OF DISCHARGE:                              HISTORY & PHYSICAL   The patient is a 45 year old gravida 5, para 1-0-3-1, EDC is June 03, 2008.  She had limited prenatal care.  She was first seen on January 17, 2008, then on May 10, 2008.  She was seen today with a blood  pressure 140/93 plus proteinuria, and then contracting mildly every 2  minutes.  She was sent to the hospital and [facility] labs were normal.  She  has had cough for 2 weeks and a chest x-ray was performed, which was  negative.  At the hospital, her blood pressures ranged between 160/101,  188/94, 166/91.  She was started on magnesium sulfate and she desired  repeat C-section and tubal ligation.   PHYSICAL EXAMINATION:  GENERAL:  A well-developed female in early labor.  HEENT:  Negative.  LUNGS:  Clear.  HEART:  Regular rhythm.  No murmurs, no gallops.  BREASTS:  No masses.  ABDOMEN:  Term size uterus, cervix closed.  EXTREMITIES:  Negative.           ______________________________  Kathreen Cosier, M.D.     BAM/MEDQ  D:  05/17/2008  T:  05/18/2008  Job:  [id number]

## 2010-10-16 ENCOUNTER — Ambulatory Visit: Payer: Self-pay | Admitting: Radiation Oncology

## 2010-10-17 NOTE — Discharge Summary (Signed)
NAMEMARLO, [name] NO.:  [id number]   MEDICAL RECORD NO.:  [id number]          PATIENT TYPE:  INP   LOCATION:  9113                          [facility]ILITY:  [facility]   PHYSICIAN:  Kathreen Cosier, M.D.DATE OF BIRTH:  07-17-1965   DATE OF ADMISSION:  05/17/2008  DATE OF DISCHARGE:  05/20/2008                               DISCHARGE SUMMARY   The patient is a 45 year old gravida 5, para 1-0-3-1, EDC June 03, 2008, had one prenatal visit.  She was admitted with a blood pressure  140/93 plus protein, contracting every 2 minutes.  PIH labs were  negative.  She had a persistent cough.  A chest x-ray was performed  which was negative.  She also had previous C-section and desired  multiparity.  She had a repeat low transverse cesarean section, had a  female, Apgar 9/9, weighing 4 pounds 12 ounces.  Uterus had multiple  myomas.  A tubal ligation was also performed at that time.  Postop day  #1, diastolics were greater than 100.  She was started on labetalol 200  p.o. b.i.d. every 8 hours.  She also had an ECG which was normal.  By  day 3, her blood pressures were normal.  She was discharged home on  Tylox for pain and labetalol every 8 hours, to see me in 1 week.   DISCHARGE DIAGNOSES:  Status post repeat low transverse cesarean section  and tubal ligation at term, limited prenatal care.           ______________________________  Kathreen Cosier, M.D.     BAM/MEDQ  D:  06/13/2008  T:  06/13/2008  Job:  [id number]

## 2010-12-08 ENCOUNTER — Other Ambulatory Visit: Payer: Self-pay | Admitting: Oncology

## 2010-12-08 ENCOUNTER — Encounter (HOSPITAL_BASED_OUTPATIENT_CLINIC_OR_DEPARTMENT_OTHER): Payer: Self-pay | Admitting: Oncology

## 2010-12-08 DIAGNOSIS — R110 Nausea: Secondary | ICD-10-CM

## 2010-12-08 DIAGNOSIS — Z9011 Acquired absence of right breast and nipple: Secondary | ICD-10-CM

## 2010-12-08 DIAGNOSIS — Z170 Estrogen receptor positive status [ER+]: Secondary | ICD-10-CM

## 2010-12-08 DIAGNOSIS — C50519 Malignant neoplasm of lower-outer quadrant of unspecified female breast: Secondary | ICD-10-CM

## 2010-12-08 DIAGNOSIS — M549 Dorsalgia, unspecified: Secondary | ICD-10-CM

## 2010-12-08 DIAGNOSIS — Z853 Personal history of malignant neoplasm of breast: Secondary | ICD-10-CM

## 2010-12-08 LAB — CBC WITH DIFFERENTIAL/PLATELET
BASO%: 0.2 % (ref 0.0–2.0)
LYMPH%: 24.5 % (ref 14.0–49.7)
MCHC: 34 g/dL (ref 31.5–36.0)
MCV: 86.9 fL (ref 79.5–101.0)
MONO#: 0.3 10*3/uL (ref 0.1–0.9)
MONO%: 6.6 % (ref 0.0–14.0)
Platelets: 252 10*3/uL (ref 145–400)
RBC: 4.29 10*6/uL (ref 3.70–5.45)
WBC: 5 10*3/uL (ref 3.9–10.3)
nRBC: 0 % (ref 0–0)

## 2010-12-30 ENCOUNTER — Ambulatory Visit (INDEPENDENT_AMBULATORY_CARE_PROVIDER_SITE_OTHER): Payer: Self-pay | Admitting: General Surgery

## 2010-12-31 ENCOUNTER — Encounter (INDEPENDENT_AMBULATORY_CARE_PROVIDER_SITE_OTHER): Payer: Self-pay | Admitting: General Surgery

## 2011-01-08 ENCOUNTER — Ambulatory Visit (INDEPENDENT_AMBULATORY_CARE_PROVIDER_SITE_OTHER): Payer: Medicaid Other | Admitting: General Surgery

## 2011-01-08 ENCOUNTER — Encounter (INDEPENDENT_AMBULATORY_CARE_PROVIDER_SITE_OTHER): Payer: Self-pay | Admitting: General Surgery

## 2011-01-08 VITALS — Ht 62.0 in | Wt 117.0 lb

## 2011-01-08 DIAGNOSIS — Z853 Personal history of malignant neoplasm of breast: Secondary | ICD-10-CM

## 2011-01-08 NOTE — Progress Notes (Signed)
Subjective:     Patient ID: [name], female   DOB: 1966-03-11, 45 y.o.   MRN: [id number]  HPI This is a 45 year old African American female, status post right mastectomy for breast cancer, who has been lost to followup with me for 18 months. She she is back to see me because she wants her Port-A-Cath removed.  Significant history is that she underwent a right mastectomy and SLN on April 29, 2009 for a pathologic stage TIII, N1, receptor positive, Ki-67 36%, HER-2-negative cancer. She saw Dr. Darnelle Mullins and Dr. Michell Mullins. She subsequently underwent insertion of a Port-A-Cath on Feb. 16 2011 and completion right axillary lymph node dissection at that time.  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 She has been on tamoxifen but that was discontinued recently because of nausea and mild anorexia.  She is overdue for mammograms and and [name] ordered that but that has not been scheduled according to the patient. She states she is willing to have the mammograms.  Past Medical History  Diagnosis Date  . Hypertension   . Breast cancer    No current outpatient prescriptions on file.   Allergies no known allergies  No family history on file. History  Substance Use Topics  . Smoking status: Current Everyday Smoker  . Smokeless tobacco: Not on file  . Alcohol Use:      Review of Systems 10 system review of systems is performed and is negative except as described above.    Objective:   Physical Exam Thin African American female in no distress. A family member is with her throughout the encounter. Neck no adenopathy no mass. I can feel the Port-A-Cath catheter coming up over the right clavicle into the internal jugular vein. No adenopathy in the neck.  Lungs clear to auscultation her chest wall  tenderness.port is palpable.  Heart regular rate and rhythm no murmur.  Right mastectomy scar well healed. It is soft without nodules ulceration or signs of recurrence. There is no axillary adenopathy the left breast is small and atrophic. Ultimately mass or axillary adenopathy.    Assessment:     1. Invasive carcinoma right breast, pathologic stage TIII, N1, receptor positive, HER-2-negative, Ki-67 36%.  2. Status post right modified radical mastectomy and Port-A-Cath insertion. No evidence of recurrence    . 3.   overdue for mammograms    4. request Port-A-Cath removal.    Plan:     We'll schedule for removal of Port-A-Cath. She requested this be done under local anesthesia.  We will schedule for her left breast mammogram since she is overdue for that.  I discussed the indications and details of Port-A-Cath removal. Risks and complications are discussed, including but not limited to bleeding, infection, nerve death chronic pain or numbness, and other unforeseen problems. She understands all of these issues well. At this time all of her questions are answered. She is improving with this plan.

## 2011-01-08 NOTE — Patient Instructions (Signed)
We will schedule you for removal of her Port-A-Cath under local anesthesia. Since you have not had mammograms in over one year, we will also schedule you for a left breast mammogram. Please call Dr. Darnelle Catalan to arrange for your next office visit with him.

## 2011-01-23 ENCOUNTER — Ambulatory Visit (HOSPITAL_BASED_OUTPATIENT_CLINIC_OR_DEPARTMENT_OTHER)
Admission: RE | Admit: 2011-01-23 | Discharge: 2011-01-23 | Disposition: A | Discharge status: Home / Self Care | Payer: Self-pay | Source: Ambulatory Visit | Attending: General Surgery | Admitting: General Surgery

## 2011-01-23 DIAGNOSIS — Z452 Encounter for adjustment and management of vascular access device: Secondary | ICD-10-CM | POA: Insufficient documentation

## 2011-01-23 DIAGNOSIS — Z79899 Other long term (current) drug therapy: Secondary | ICD-10-CM | POA: Insufficient documentation

## 2011-01-23 DIAGNOSIS — Z9010 Acquired absence of unspecified breast and nipple: Secondary | ICD-10-CM | POA: Insufficient documentation

## 2011-01-23 DIAGNOSIS — Z4651 Encounter for fitting and adjustment of gastric lap band: Secondary | ICD-10-CM

## 2011-01-23 DIAGNOSIS — C50919 Malignant neoplasm of unspecified site of unspecified female breast: Secondary | ICD-10-CM | POA: Insufficient documentation

## 2011-01-26 NOTE — Op Note (Signed)
  NAMEBRYNLEIGH, SEQUEIRA NO.:  [id number]  MEDICAL RECORD NO.:  [id number]  LOCATION:                                 FACILITY:  PHYSICIAN:  Angelia Mould. Derrell Lolling, M.D.DATE OF BIRTH:  Aug 06, 1965  DATE OF PROCEDURE:  01/23/2011 DATE OF DISCHARGE:                              OPERATIVE REPORT   PREOPERATIVE DIAGNOSIS:  Right breast cancer.  POSTOPERATIVE DIAGNOSIS:  Right breast cancer.  OPERATION PERFORMED:  Removal of Port-A-Cath.  SURGEON:  Angelia Mould. Derrell Lolling, MD  OPERATIVE INDICATIONS:  This is a 45 year old African American female who underwent right mastectomy for breast cancer on April 29, 2009. She had pathologic stage T3, N1, receptor positive, HER2 negative cancer.  She had Port-A-Cath insertion on July 17, 2009, and complete right axillary lymph node dissection at the same time.  She has had adjuvant chemotherapy.  She was referred back to me by Dr. Darnelle Catalan for removal of the Port-A-Cath.  OPERATIVE TECHNIQUE:  The patient was brought to room 8 at [facility].  The surgery was done under local anesthesia.  The right chest and right neck were prepped and draped in a sterile fashion. Surgical time-out was held identifying correct patient, correct procedure, and correct site.  Xylocaine 1% with epinephrine was used as a local infiltration anesthetic.  Transverse incision was made in the right infraclavicular area, through the previous scar, overlying the port.  Dissection was carried down through subcutaneous tissue.  We entered the capsule surrounding the port and dissected the capsule away. We then lifted the port up and identified all three of the Prolene sutures, which were cut and removed.  We dissected the port and the catheter away from the capsule, and then the port and the catheter could be easily removed intact.  There was no bleeding.  There was no sign of infection.  The subcutaneous tissue was closed with interrupted  3-0 Vicryl sutures, and the skin closed with subcuticular sutures of 4-0 Monocryl and Dermabond.  The patient tolerated the procedure well and was taken to the recovery area in stable condition.  Estimated blood loss was about 5 mL or less.  Complications none.  Sponge, needle, and instrument counts were correct.     Angelia Mould. Derrell Lolling, M.D.    HMI/MEDQ  D:  01/23/2011  T:  01/23/2011  Job:  [id number]  cc:   Lowella Dell, M.D.  Electronically Signed by Claud Kelp M.D. on 01/26/2011 07:00:17 PM

## 2011-02-05 ENCOUNTER — Inpatient Hospital Stay (HOSPITAL_COMMUNITY)
Admission: RE | Admit: 2011-02-05 | Discharge: 2011-02-05 | Disposition: A | Discharge status: Home / Self Care | Payer: Self-pay | Source: Ambulatory Visit | Attending: Family Medicine | Admitting: Family Medicine

## 2011-02-09 ENCOUNTER — Emergency Department (HOSPITAL_COMMUNITY)
Admission: EM | Admit: 2011-02-09 | Discharge: 2011-02-09 | Disposition: A | Discharge status: Home / Self Care | Payer: Medicaid Other | Attending: Emergency Medicine | Admitting: Emergency Medicine

## 2011-02-09 ENCOUNTER — Inpatient Hospital Stay (INDEPENDENT_AMBULATORY_CARE_PROVIDER_SITE_OTHER)
Admission: RE | Admit: 2011-02-09 | Discharge: 2011-02-09 | Disposition: A | Discharge status: Home / Self Care | Payer: Self-pay | Source: Ambulatory Visit | Attending: Family Medicine | Admitting: Family Medicine

## 2011-02-09 DIAGNOSIS — N739 Female pelvic inflammatory disease, unspecified: Secondary | ICD-10-CM | POA: Insufficient documentation

## 2011-02-09 DIAGNOSIS — R109 Unspecified abdominal pain: Secondary | ICD-10-CM

## 2011-02-09 DIAGNOSIS — N760 Acute vaginitis: Secondary | ICD-10-CM | POA: Insufficient documentation

## 2011-02-09 DIAGNOSIS — A499 Bacterial infection, unspecified: Secondary | ICD-10-CM | POA: Insufficient documentation

## 2011-02-09 DIAGNOSIS — R110 Nausea: Secondary | ICD-10-CM | POA: Insufficient documentation

## 2011-02-09 DIAGNOSIS — N949 Unspecified condition associated with female genital organs and menstrual cycle: Secondary | ICD-10-CM | POA: Insufficient documentation

## 2011-02-09 DIAGNOSIS — Z853 Personal history of malignant neoplasm of breast: Secondary | ICD-10-CM | POA: Insufficient documentation

## 2011-02-09 DIAGNOSIS — B9689 Other specified bacterial agents as the cause of diseases classified elsewhere: Secondary | ICD-10-CM | POA: Insufficient documentation

## 2011-02-09 LAB — WET PREP, GENITAL: Trich, Wet Prep: NONE SEEN

## 2011-02-09 LAB — POCT URINALYSIS DIP (DEVICE)
Protein, ur: 30 mg/dL — AB
Urobilinogen, UA: 0.2 mg/dL (ref 0.0–1.0)

## 2011-02-09 LAB — POCT PREGNANCY, URINE: Preg Test, Ur: NEGATIVE

## 2011-02-10 LAB — URINE CULTURE
Colony Count: 60000
Culture  Setup Time: 201209101132

## 2011-02-10 LAB — GC/CHLAMYDIA PROBE AMP, GENITAL: Chlamydia, DNA Probe: NEGATIVE

## 2011-02-20 ENCOUNTER — Encounter (INDEPENDENT_AMBULATORY_CARE_PROVIDER_SITE_OTHER): Payer: Self-pay | Admitting: General Surgery

## 2011-02-20 ENCOUNTER — Emergency Department (HOSPITAL_COMMUNITY)
Admission: EM | Admit: 2011-02-20 | Discharge: 2011-02-20 | Disposition: A | Discharge status: Home / Self Care | Payer: Medicaid Other | Attending: Emergency Medicine | Admitting: Emergency Medicine

## 2011-02-20 ENCOUNTER — Inpatient Hospital Stay (INDEPENDENT_AMBULATORY_CARE_PROVIDER_SITE_OTHER)
Admission: RE | Admit: 2011-02-20 | Discharge: 2011-02-20 | Disposition: A | Discharge status: Home / Self Care | Payer: Self-pay | Source: Ambulatory Visit | Attending: Family Medicine | Admitting: Family Medicine

## 2011-02-20 DIAGNOSIS — B9689 Other specified bacterial agents as the cause of diseases classified elsewhere: Secondary | ICD-10-CM | POA: Insufficient documentation

## 2011-02-20 DIAGNOSIS — R1011 Right upper quadrant pain: Secondary | ICD-10-CM | POA: Insufficient documentation

## 2011-02-20 DIAGNOSIS — Z853 Personal history of malignant neoplasm of breast: Secondary | ICD-10-CM | POA: Insufficient documentation

## 2011-02-20 DIAGNOSIS — N760 Acute vaginitis: Secondary | ICD-10-CM | POA: Insufficient documentation

## 2011-02-20 DIAGNOSIS — R1110 Vomiting, unspecified: Secondary | ICD-10-CM | POA: Insufficient documentation

## 2011-02-20 DIAGNOSIS — A499 Bacterial infection, unspecified: Secondary | ICD-10-CM | POA: Insufficient documentation

## 2011-02-20 DIAGNOSIS — R10811 Right upper quadrant abdominal tenderness: Secondary | ICD-10-CM

## 2011-02-20 DIAGNOSIS — R10819 Abdominal tenderness, unspecified site: Secondary | ICD-10-CM | POA: Insufficient documentation

## 2011-02-20 LAB — URINALYSIS, ROUTINE W REFLEX MICROSCOPIC
Bilirubin Urine: NEGATIVE
Leukocytes, UA: NEGATIVE
Nitrite: NEGATIVE
Specific Gravity, Urine: 1.012 (ref 1.005–1.030)
Urobilinogen, UA: 1 mg/dL (ref 0.0–1.0)

## 2011-02-20 LAB — WET PREP, GENITAL

## 2011-02-20 LAB — CBC
MCH: 29.5 pg (ref 26.0–34.0)
MCHC: 35.7 g/dL (ref 30.0–36.0)
Platelets: 278 10*3/uL (ref 150–400)

## 2011-02-20 LAB — URINE MICROSCOPIC-ADD ON

## 2011-02-20 LAB — DIFFERENTIAL
Basophils Absolute: 0 10*3/uL (ref 0.0–0.1)
Basophils Relative: 0 % (ref 0–1)
Eosinophils Absolute: 0 10*3/uL (ref 0.0–0.7)
Monocytes Absolute: 0.3 10*3/uL (ref 0.1–1.0)
Monocytes Relative: 5 % (ref 3–12)
Neutrophils Relative %: 77 % (ref 43–77)

## 2011-02-20 LAB — COMPREHENSIVE METABOLIC PANEL
ALT: 13 U/L (ref 0–35)
AST: 22 U/L (ref 0–37)
Albumin: 4 g/dL (ref 3.5–5.2)
Calcium: 9.8 mg/dL (ref 8.4–10.5)
GFR calc Af Amer: >60 mL/min (ref >60)
Potassium: 3.6 mEq/L (ref 3.5–5.1)
Sodium: 138 mEq/L (ref 135–145)
Total Protein: 7.5 g/dL (ref 6.0–8.3)

## 2011-02-21 LAB — GC/CHLAMYDIA PROBE AMP, GENITAL: Chlamydia, DNA Probe: NEGATIVE

## 2011-02-23 ENCOUNTER — Encounter (INDEPENDENT_AMBULATORY_CARE_PROVIDER_SITE_OTHER): Payer: Medicaid Other | Admitting: General Surgery

## 2011-02-24 ENCOUNTER — Emergency Department (HOSPITAL_COMMUNITY)
Admission: EM | Admit: 2011-02-24 | Discharge: 2011-02-24 | Disposition: A | Discharge status: Home / Self Care | Payer: Medicaid Other | Attending: Emergency Medicine | Admitting: Emergency Medicine

## 2011-02-24 ENCOUNTER — Emergency Department (HOSPITAL_COMMUNITY): Payer: Medicaid Other

## 2011-02-24 ENCOUNTER — Encounter (HOSPITAL_COMMUNITY): Payer: Self-pay | Admitting: Radiology

## 2011-02-24 DIAGNOSIS — C801 Malignant (primary) neoplasm, unspecified: Secondary | ICD-10-CM | POA: Insufficient documentation

## 2011-02-24 DIAGNOSIS — R112 Nausea with vomiting, unspecified: Secondary | ICD-10-CM | POA: Insufficient documentation

## 2011-02-24 DIAGNOSIS — N200 Calculus of kidney: Secondary | ICD-10-CM | POA: Insufficient documentation

## 2011-02-24 DIAGNOSIS — R1011 Right upper quadrant pain: Secondary | ICD-10-CM | POA: Insufficient documentation

## 2011-02-24 LAB — URINALYSIS, ROUTINE W REFLEX MICROSCOPIC
Bilirubin Urine: NEGATIVE
Ketones, ur: 15 mg/dL — AB
Nitrite: NEGATIVE
Urobilinogen, UA: 1 mg/dL (ref 0.0–1.0)

## 2011-02-24 LAB — DIFFERENTIAL
Eosinophils Relative: 0 % (ref 0–5)
Lymphocytes Relative: 18 % (ref 12–46)
Lymphs Abs: 1.1 10*3/uL (ref 0.7–4.0)

## 2011-02-24 LAB — LIPASE, BLOOD: Lipase: 13 U/L (ref 11–59)

## 2011-02-24 LAB — COMPREHENSIVE METABOLIC PANEL
BUN: 10 mg/dL (ref 6–23)
CO2: 25 mEq/L (ref 19–32)
Chloride: 102 mEq/L (ref 96–112)
Creatinine, Ser: 0.9 mg/dL (ref 0.50–1.10)
GFR calc Af Amer: >60 mL/min (ref >60)
GFR calc non Af Amer: >60 mL/min (ref >60)
Total Bilirubin: 0.5 mg/dL (ref 0.3–1.2)

## 2011-02-24 LAB — CBC
HCT: 37 % (ref 36.0–46.0)
MCV: 83.3 fL (ref 78.0–100.0)
RBC: 4.44 MIL/uL (ref 3.87–5.11)
RDW: 13.6 % (ref 11.5–15.5)
WBC: 6.2 10*3/uL (ref 4.0–10.5)

## 2011-02-24 LAB — POCT PREGNANCY, URINE: Preg Test, Ur: NEGATIVE

## 2011-02-24 LAB — URINE MICROSCOPIC-ADD ON

## 2011-02-25 ENCOUNTER — Other Ambulatory Visit: Payer: Self-pay | Admitting: Oncology

## 2011-02-25 ENCOUNTER — Encounter (HOSPITAL_BASED_OUTPATIENT_CLINIC_OR_DEPARTMENT_OTHER): Payer: Self-pay | Admitting: Oncology

## 2011-02-25 DIAGNOSIS — C50519 Malignant neoplasm of lower-outer quadrant of unspecified female breast: Secondary | ICD-10-CM

## 2011-02-25 DIAGNOSIS — R935 Abnormal findings on diagnostic imaging of other abdominal regions, including retroperitoneum: Secondary | ICD-10-CM

## 2011-02-25 DIAGNOSIS — Z170 Estrogen receptor positive status [ER+]: Secondary | ICD-10-CM

## 2011-02-25 DIAGNOSIS — M545 Low back pain: Secondary | ICD-10-CM

## 2011-02-25 LAB — COMPREHENSIVE METABOLIC PANEL
Albumin: 3.9 g/dL (ref 3.5–5.2)
Alkaline Phosphatase: 84 U/L (ref 39–117)
BUN: 12 mg/dL (ref 6–23)
CO2: 23 mEq/L (ref 19–32)
Calcium: 8.8 mg/dL (ref 8.4–10.5)
Glucose, Bld: 120 mg/dL — ABNORMAL HIGH (ref 70–99)
Potassium: 3.8 mEq/L (ref 3.5–5.3)

## 2011-02-25 LAB — CBC WITH DIFFERENTIAL/PLATELET
Basophils Absolute: 0 10*3/uL (ref 0.0–0.1)
Eosinophils Absolute: 0 10*3/uL (ref 0.0–0.5)
HGB: 12 g/dL (ref 11.6–15.9)
MCV: 87.8 fL (ref 79.5–101.0)
MONO#: 0.2 10*3/uL (ref 0.1–0.9)
MONO%: 7.7 % (ref 0.0–14.0)
NEUT#: 1.8 10*3/uL (ref 1.5–6.5)
Platelets: 264 10*3/uL (ref 145–400)
RDW: 14.3 % (ref 11.2–14.5)

## 2011-02-25 LAB — CANCER ANTIGEN 27.29: CA 27.29: 25 U/mL (ref 0–39)

## 2011-02-26 ENCOUNTER — Other Ambulatory Visit: Payer: Self-pay | Admitting: Oncology

## 2011-02-26 DIAGNOSIS — K769 Liver disease, unspecified: Secondary | ICD-10-CM

## 2011-02-26 LAB — URINE CULTURE

## 2011-02-26 LAB — URINALYSIS, ROUTINE W REFLEX MICROSCOPIC
Glucose, UA: 100 — AB
Leukocytes, UA: NEGATIVE
Nitrite: NEGATIVE
Protein, ur: NEGATIVE
Urobilinogen, UA: 0.2

## 2011-02-26 LAB — CBC
Hemoglobin: 11.3 — ABNORMAL LOW
RBC: 3.95
WBC: 7.8

## 2011-02-26 LAB — POCT PREGNANCY, URINE: Operator id: 208801

## 2011-02-26 LAB — URINE MICROSCOPIC-ADD ON

## 2011-02-26 LAB — ABO/RH: ABO/RH(D): A POS

## 2011-02-26 LAB — WET PREP, GENITAL

## 2011-03-03 ENCOUNTER — Inpatient Hospital Stay (HOSPITAL_COMMUNITY): Admission: RE | Admit: 2011-03-03 | Payer: Self-pay | Source: Ambulatory Visit

## 2011-03-03 ENCOUNTER — Emergency Department (HOSPITAL_COMMUNITY)
Admission: EM | Admit: 2011-03-03 | Discharge: 2011-03-03 | Disposition: A | Discharge status: Home / Self Care | Payer: Medicaid Other | Attending: Emergency Medicine | Admitting: Emergency Medicine

## 2011-03-03 ENCOUNTER — Ambulatory Visit (HOSPITAL_COMMUNITY): Payer: Self-pay

## 2011-03-03 DIAGNOSIS — R10819 Abdominal tenderness, unspecified site: Secondary | ICD-10-CM | POA: Insufficient documentation

## 2011-03-03 DIAGNOSIS — C801 Malignant (primary) neoplasm, unspecified: Secondary | ICD-10-CM | POA: Insufficient documentation

## 2011-03-03 DIAGNOSIS — C50919 Malignant neoplasm of unspecified site of unspecified female breast: Secondary | ICD-10-CM | POA: Insufficient documentation

## 2011-03-03 DIAGNOSIS — M545 Low back pain, unspecified: Secondary | ICD-10-CM | POA: Insufficient documentation

## 2011-03-03 LAB — URINALYSIS, ROUTINE W REFLEX MICROSCOPIC
Glucose, UA: NEGATIVE mg/dL
Leukocytes, UA: NEGATIVE
Protein, ur: 100 mg/dL — AB
pH: 6.5 (ref 5.0–8.0)

## 2011-03-03 LAB — URINE MICROSCOPIC-ADD ON

## 2011-03-05 ENCOUNTER — Other Ambulatory Visit (HOSPITAL_COMMUNITY): Payer: Self-pay

## 2011-03-06 LAB — URINALYSIS, ROUTINE W REFLEX MICROSCOPIC
Bilirubin Urine: NEGATIVE
Glucose, UA: NEGATIVE mg/dL
Specific Gravity, Urine: 1.01 (ref 1.005–1.030)
pH: 6 (ref 5.0–8.0)

## 2011-03-06 LAB — COMPREHENSIVE METABOLIC PANEL
AST: 19 U/L (ref 0–37)
Albumin: 2.1 g/dL — ABNORMAL LOW (ref 3.5–5.2)
Calcium: 8.9 mg/dL (ref 8.4–10.5)
Chloride: 103 mEq/L (ref 96–112)
Creatinine, Ser: 1.02 mg/dL (ref 0.4–1.2)
GFR calc Af Amer: >60 mL/min (ref >60)
Total Bilirubin: 0.3 mg/dL (ref 0.3–1.2)
Total Protein: 6.1 g/dL (ref 6.0–8.3)

## 2011-03-06 LAB — URINE MICROSCOPIC-ADD ON

## 2011-03-06 LAB — CBC
HCT: 26.6 % — ABNORMAL LOW (ref 36.0–46.0)
Hemoglobin: 9.1 g/dL — ABNORMAL LOW (ref 12.0–15.0)
MCV: 87.4 fL (ref 78.0–100.0)
Platelets: 368 10*3/uL (ref 150–400)
RBC: 3.01 MIL/uL — ABNORMAL LOW (ref 3.87–5.11)
WBC: 7.2 10*3/uL (ref 4.0–10.5)
WBC: 8.9 10*3/uL (ref 4.0–10.5)

## 2011-03-06 LAB — RPR: RPR Ser Ql: NONREACTIVE

## 2011-03-09 ENCOUNTER — Encounter (INDEPENDENT_AMBULATORY_CARE_PROVIDER_SITE_OTHER): Payer: Self-pay | Admitting: General Surgery

## 2011-03-10 ENCOUNTER — Other Ambulatory Visit: Payer: Self-pay | Admitting: Oncology

## 2011-03-10 ENCOUNTER — Ambulatory Visit (HOSPITAL_COMMUNITY)
Admission: RE | Admit: 2011-03-10 | Discharge: 2011-03-10 | Disposition: A | Discharge status: Home / Self Care | Payer: Medicaid Other | Source: Ambulatory Visit | Attending: Oncology | Admitting: Oncology

## 2011-03-10 ENCOUNTER — Ambulatory Visit (HOSPITAL_COMMUNITY): Payer: Medicaid Other

## 2011-03-10 ENCOUNTER — Other Ambulatory Visit (HOSPITAL_COMMUNITY): Payer: Self-pay

## 2011-03-10 ENCOUNTER — Other Ambulatory Visit: Payer: Self-pay | Admitting: Interventional Radiology

## 2011-03-10 DIAGNOSIS — C50919 Malignant neoplasm of unspecified site of unspecified female breast: Secondary | ICD-10-CM | POA: Insufficient documentation

## 2011-03-10 DIAGNOSIS — C787 Secondary malignant neoplasm of liver and intrahepatic bile duct: Secondary | ICD-10-CM | POA: Insufficient documentation

## 2011-03-10 DIAGNOSIS — K769 Liver disease, unspecified: Secondary | ICD-10-CM

## 2011-03-10 LAB — CBC
Hemoglobin: 12.6 g/dL (ref 12.0–15.0)
Platelets: 299 10*3/uL (ref 150–400)
RBC: 4.39 MIL/uL (ref 3.87–5.11)

## 2011-03-10 LAB — PROTIME-INR
INR: 1.07 (ref 0.00–1.49)
Prothrombin Time: 14.1 seconds (ref 11.6–15.2)

## 2011-03-14 ENCOUNTER — Emergency Department (HOSPITAL_COMMUNITY)
Admission: EM | Admit: 2011-03-14 | Discharge: 2011-03-14 | Disposition: A | Discharge status: Home / Self Care | Payer: Medicaid Other | Attending: Emergency Medicine | Admitting: Emergency Medicine

## 2011-03-14 ENCOUNTER — Emergency Department (HOSPITAL_COMMUNITY): Payer: Medicaid Other

## 2011-03-14 DIAGNOSIS — M549 Dorsalgia, unspecified: Secondary | ICD-10-CM | POA: Insufficient documentation

## 2011-03-14 DIAGNOSIS — C50919 Malignant neoplasm of unspecified site of unspecified female breast: Secondary | ICD-10-CM | POA: Insufficient documentation

## 2011-03-14 DIAGNOSIS — Z87442 Personal history of urinary calculi: Secondary | ICD-10-CM | POA: Insufficient documentation

## 2011-03-14 DIAGNOSIS — K5900 Constipation, unspecified: Secondary | ICD-10-CM | POA: Insufficient documentation

## 2011-03-14 LAB — DIFFERENTIAL
Basophils Absolute: 0 10*3/uL (ref 0.0–0.1)
Lymphocytes Relative: 21 % (ref 12–46)
Monocytes Absolute: 0.3 10*3/uL (ref 0.1–1.0)
Neutro Abs: 3.5 10*3/uL (ref 1.7–7.7)
Neutrophils Relative %: 71 % (ref 43–77)

## 2011-03-14 LAB — COMPREHENSIVE METABOLIC PANEL
ALT: 10 U/L (ref 0–35)
Alkaline Phosphatase: 118 U/L — ABNORMAL HIGH (ref 39–117)
BUN: 8 mg/dL (ref 6–23)
CO2: 24 mEq/L (ref 19–32)
Chloride: 100 mEq/L (ref 96–112)
GFR calc Af Amer: >90 mL/min (ref >90)
GFR calc non Af Amer: >90 mL/min (ref >90)
Glucose, Bld: 89 mg/dL (ref 70–99)
Potassium: 3.7 mEq/L (ref 3.5–5.1)
Total Bilirubin: 0.2 mg/dL — ABNORMAL LOW (ref 0.3–1.2)

## 2011-03-14 LAB — URINALYSIS, ROUTINE W REFLEX MICROSCOPIC
Bilirubin Urine: NEGATIVE
Ketones, ur: NEGATIVE mg/dL
Nitrite: NEGATIVE
Urobilinogen, UA: 0.2 mg/dL (ref 0.0–1.0)
pH: 6.5 (ref 5.0–8.0)

## 2011-03-14 LAB — CBC
HCT: 37.4 % (ref 36.0–46.0)
Hemoglobin: 13.2 g/dL (ref 12.0–15.0)
MCHC: 35.3 g/dL (ref 30.0–36.0)
RBC: 4.45 MIL/uL (ref 3.87–5.11)

## 2011-03-14 LAB — URINE MICROSCOPIC-ADD ON

## 2011-03-16 ENCOUNTER — Other Ambulatory Visit: Payer: Self-pay | Admitting: Oncology

## 2011-03-16 ENCOUNTER — Encounter (HOSPITAL_BASED_OUTPATIENT_CLINIC_OR_DEPARTMENT_OTHER): Payer: Medicaid Other | Admitting: Oncology

## 2011-03-16 DIAGNOSIS — Z170 Estrogen receptor positive status [ER+]: Secondary | ICD-10-CM

## 2011-03-16 DIAGNOSIS — R110 Nausea: Secondary | ICD-10-CM

## 2011-03-16 DIAGNOSIS — M549 Dorsalgia, unspecified: Secondary | ICD-10-CM

## 2011-03-16 DIAGNOSIS — Z5111 Encounter for antineoplastic chemotherapy: Secondary | ICD-10-CM

## 2011-03-16 DIAGNOSIS — C50519 Malignant neoplasm of lower-outer quadrant of unspecified female breast: Secondary | ICD-10-CM

## 2011-03-16 LAB — URINALYSIS, MICROSCOPIC - CHCC
Glucose: NEGATIVE g/dL
Leukocyte Esterase: NEGATIVE
Nitrite: NEGATIVE
Protein: 30 mg/dL
Specific Gravity, Urine: 1.02 (ref 1.003–1.035)

## 2011-03-16 LAB — COMPREHENSIVE METABOLIC PANEL
ALT: 11 U/L (ref 0–35)
AST: 19 U/L (ref 0–37)
CO2: 27 mEq/L (ref 19–32)
Calcium: 9.7 mg/dL (ref 8.4–10.5)
Chloride: 102 mEq/L (ref 96–112)
Sodium: 138 mEq/L (ref 135–145)
Total Bilirubin: 0.2 mg/dL — ABNORMAL LOW (ref 0.3–1.2)
Total Protein: 6.7 g/dL (ref 6.0–8.3)

## 2011-03-16 LAB — CBC WITH DIFFERENTIAL/PLATELET
BASO%: 0.3 % (ref 0.0–2.0)
EOS%: 0.3 % (ref 0.0–7.0)
MCH: 29.8 pg (ref 25.1–34.0)
MCHC: 34 g/dL (ref 31.5–36.0)
MONO#: 0.2 10*3/uL (ref 0.1–0.9)
RBC: 4.01 10*6/uL (ref 3.70–5.45)
WBC: 4.2 10*3/uL (ref 3.9–10.3)
lymph#: 1.2 10*3/uL (ref 0.9–3.3)

## 2011-03-20 ENCOUNTER — Emergency Department (HOSPITAL_COMMUNITY)
Admission: EM | Admit: 2011-03-20 | Discharge: 2011-03-21 | Disposition: A | Discharge status: Other Acute Inpatient Hospital | Payer: Medicaid Other | Source: Home / Self Care | Attending: Emergency Medicine | Admitting: Emergency Medicine

## 2011-03-20 ENCOUNTER — Emergency Department (HOSPITAL_COMMUNITY): Payer: Medicaid Other

## 2011-03-20 DIAGNOSIS — Z79899 Other long term (current) drug therapy: Secondary | ICD-10-CM

## 2011-03-20 DIAGNOSIS — G894 Chronic pain syndrome: Secondary | ICD-10-CM | POA: Diagnosis present

## 2011-03-20 DIAGNOSIS — Z853 Personal history of malignant neoplasm of breast: Secondary | ICD-10-CM

## 2011-03-20 DIAGNOSIS — C7951 Secondary malignant neoplasm of bone: Principal | ICD-10-CM | POA: Diagnosis present

## 2011-03-20 DIAGNOSIS — Z87442 Personal history of urinary calculi: Secondary | ICD-10-CM

## 2011-03-20 DIAGNOSIS — C7952 Secondary malignant neoplasm of bone marrow: Principal | ICD-10-CM | POA: Diagnosis present

## 2011-03-20 DIAGNOSIS — D259 Leiomyoma of uterus, unspecified: Secondary | ICD-10-CM | POA: Diagnosis present

## 2011-03-20 DIAGNOSIS — K5900 Constipation, unspecified: Secondary | ICD-10-CM | POA: Diagnosis present

## 2011-03-20 DIAGNOSIS — G992 Myelopathy in diseases classified elsewhere: Secondary | ICD-10-CM | POA: Diagnosis present

## 2011-03-20 DIAGNOSIS — N390 Urinary tract infection, site not specified: Secondary | ICD-10-CM | POA: Diagnosis present

## 2011-03-20 DIAGNOSIS — R5381 Other malaise: Secondary | ICD-10-CM | POA: Diagnosis present

## 2011-03-20 DIAGNOSIS — Z9010 Acquired absence of unspecified breast and nipple: Secondary | ICD-10-CM

## 2011-03-20 DIAGNOSIS — C787 Secondary malignant neoplasm of liver and intrahepatic bile duct: Secondary | ICD-10-CM | POA: Diagnosis present

## 2011-03-20 DIAGNOSIS — Z9221 Personal history of antineoplastic chemotherapy: Secondary | ICD-10-CM

## 2011-03-21 ENCOUNTER — Other Ambulatory Visit: Payer: Self-pay | Admitting: Neurosurgery

## 2011-03-21 ENCOUNTER — Inpatient Hospital Stay (HOSPITAL_COMMUNITY): Payer: Medicaid Other

## 2011-03-21 ENCOUNTER — Encounter: Payer: Self-pay | Admitting: Physician Assistant

## 2011-03-21 ENCOUNTER — Other Ambulatory Visit: Payer: Self-pay | Admitting: Physician Assistant

## 2011-03-21 ENCOUNTER — Emergency Department (HOSPITAL_COMMUNITY): Payer: Medicaid Other

## 2011-03-21 ENCOUNTER — Inpatient Hospital Stay (HOSPITAL_COMMUNITY)
Admission: AD | Admit: 2011-03-21 | Discharge: 2011-03-26 | DRG: 490 | Disposition: A | Discharge status: Home Health | Payer: Medicaid Other | Source: Other Acute Inpatient Hospital | Attending: Internal Medicine | Admitting: Internal Medicine

## 2011-03-21 ENCOUNTER — Emergency Department (HOSPITAL_COMMUNITY): Payer: Self-pay

## 2011-03-21 DIAGNOSIS — C50919 Malignant neoplasm of unspecified site of unspecified female breast: Secondary | ICD-10-CM | POA: Insufficient documentation

## 2011-03-21 DIAGNOSIS — C787 Secondary malignant neoplasm of liver and intrahepatic bile duct: Secondary | ICD-10-CM

## 2011-03-21 HISTORY — DX: Secondary malignant neoplasm of liver and intrahepatic bile duct: C78.7

## 2011-03-21 HISTORY — DX: Malignant neoplasm of unspecified site of unspecified female breast: C50.919

## 2011-03-21 LAB — COMPREHENSIVE METABOLIC PANEL
Albumin: 3.5 g/dL (ref 3.5–5.2)
Alkaline Phosphatase: 126 U/L — ABNORMAL HIGH (ref 39–117)
BUN: 9 mg/dL (ref 6–23)
CO2: 32 mEq/L (ref 19–32)
Chloride: 99 mEq/L (ref 96–112)
Creatinine, Ser: 0.63 mg/dL (ref 0.50–1.10)
GFR calc non Af Amer: >90 mL/min (ref >90)
Glucose, Bld: 99 mg/dL (ref 70–99)
Potassium: 3.5 mEq/L (ref 3.5–5.1)
Total Bilirubin: 0.3 mg/dL (ref 0.3–1.2)

## 2011-03-21 LAB — CROSSMATCH
ABO/RH(D): A POS
Antibody Screen: NEGATIVE
Unit division: 0
Unit division: 0

## 2011-03-21 LAB — CBC
HCT: 35.1 % — ABNORMAL LOW (ref 36.0–46.0)
Hemoglobin: 11.8 g/dL — ABNORMAL LOW (ref 12.0–15.0)
MCH: 28.6 pg (ref 26.0–34.0)
MCHC: 33.6 g/dL (ref 30.0–36.0)
MCV: 85.2 fL (ref 78.0–100.0)
RBC: 4.12 MIL/uL (ref 3.87–5.11)

## 2011-03-21 LAB — AMMONIA: Ammonia: 33 umol/L (ref 11–60)

## 2011-03-21 LAB — DIFFERENTIAL
Basophils Relative: 0 % (ref 0–1)
Lymphs Abs: 1.1 10*3/uL (ref 0.7–4.0)
Monocytes Absolute: 0.4 10*3/uL (ref 0.1–1.0)
Monocytes Relative: 7 % (ref 3–12)
Neutro Abs: 4.8 10*3/uL (ref 1.7–7.7)
Neutrophils Relative %: 76 % (ref 43–77)

## 2011-03-21 LAB — POCT I-STAT 4, (NA,K, GLUC, HGB,HCT)
Glucose, Bld: 157 mg/dL — ABNORMAL HIGH (ref 70–99)
HCT: 28 % — ABNORMAL LOW (ref 36.0–46.0)
Hemoglobin: 9.5 g/dL — ABNORMAL LOW (ref 12.0–15.0)
Potassium: 4.3 mEq/L (ref 3.5–5.1)
Sodium: 137 mEq/L (ref 135–145)
Sodium: 137 mEq/L (ref 135–145)

## 2011-03-21 LAB — MRSA PCR SCREENING: MRSA by PCR: NEGATIVE

## 2011-03-21 LAB — ABO/RH: ABO/RH(D): A POS

## 2011-03-21 MED ORDER — IOHEXOL 300 MG/ML  SOLN
100.0000 mL | Freq: Once | INTRAMUSCULAR | Status: AC | PRN
  Administered 2011-03-21: 100 mL via INTRAVENOUS

## 2011-03-21 MED ORDER — GADOBENATE DIMEGLUMINE 529 MG/ML IV SOLN
10.0000 mL | Freq: Once | INTRAVENOUS | Status: AC | PRN
  Administered 2011-03-21: 10 mL via INTRAVENOUS

## 2011-03-21 NOTE — H&P (Signed)
NAME:  [name], [name] NO.:  [id number]  MEDICAL RECORD NO.:  [id number]  LOCATION:  WLED                         FACILITY:  [facility]  PHYSICIAN:  Della Goo, M.D. DATE OF BIRTH:  06/21/1965  DATE OF ADMISSION:  03/21/2011 DATE OF DISCHARGE:                             HISTORY & PHYSICAL   ONCOLOGIST:  Lowella Dell, M.D.  CONSULTANTS: 1. Maryln Gottron, M.D., Radiation Oncology. 2. Neurosurgery, Hewitt Shorts, M.D. 3. Pierce Crane, M.D., [facility] on-call oncologist.  CHIEF COMPLAINT:  Abdominal pain.  HISTORY OF PRESENT ILLNESS:  This is a 45 year old female with a history of breast cancer which was diagnosed in November 2010, who is status post right modified radical mastectomy with axillary lymph node dissection on February 2011, followed by adjuvant chemotherapy, who presents to the emergency department with complaints of lower abdominal discomfort, constipation, and a feeling of having to evacuate her bowels.  The patient reports unable to have bowel movements even with her  medication.  The patient is on chronic pain medication with methadone which was started recently, and has been prescribed MiraLax and stool  softeners.  Her last regular BM was 1 week ago per her report.  The  patient does report having nausea.  The patient was evaluated in the emergency department, and underwent a CT scan of the abdomen and pelvis to evaluate for possible bowel obstruction, after an acute abdominal series had been performed.  The CT scan of the abdomen and pelvis revealed metastatic disease to the liver with large lesions in the right and left hepatic lobes along with an enhancing soft tissue mass, which was eroding into the right side and posterior elements of the vertebral bodies of T10 and T11, completely surrounding the spinal cord with compression of the spinal cord at the level of T10 and T11.  This had been characterized in a previous  study; however, has worsened in comparison.  The previous study had been performed on February 24, 2011.  The patient had been found on February 24, 2011, to have the lesion that is mentioned above and to have metastatic findings.  Options were discussed and the patient with her oncologist, Dr. Darnelle Catalan started on chemotherapeutic regimen which began 1 week ago, to help reduce the tumors.  As the time followed the documentation, Dr. Darnelle Catalan did not feel that the patient was hospice ready.  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    In the emergency department, the patient was referred to the [facility]ist service for medical admission, and the patient was seen  and evaluated, and administered IV Decadron therapy with a loading dose of 10 mg IV, followed by 4 mg IV q.6 hours to continue.  The oncologist on-call, Dr. Donnie Coffin was contacted and the case was discussed, and Dr.  Donnie Coffin was consulted for further evaluation and planning for surgical  versus combined medical options for this patient.  Radiation Oncologist, Dr. Dayton Scrape was also consulted to see this patient for evaluation of  radiation therapy options as well and Neurosurgery on-call, Dr.  Newell Coral was consulted as well to evaluate for surgical options in regard to spinal cord decompression surgery, if possible.  All 3 of these consultants have been consulted to see the patient.    The Past medical history is as mentioned above.  Please also note that the patient also had the original treatment regimen with Radiation Oncology in the past; however, she did not continue and complete her radiation treatment regimen, and the radiation treatment regimens were discontinued at that time.  The patient also has had poor compliance with medical treatments and did not tolerate oral tamoxifen  therapy and  had poor follow up  or compliance with her chemotherapy as well.  PAST MEDICAL HISTORY:  Also significant for nephrolithiasis, uterine fibroids, and history of preeclampsia associated with her pregnancies.  PAST SURGICAL HISTORY:  History of the modified radical mastectomy with axillary node dissection of the right breast, Port-A-Cath placement and removal, C-section x2, and bilateral tubal ligation.  MEDICATIONS:  At this time include methadone 5 mg 1 p.o. t.i.d., Zofran p.r.n. nausea, MiraLax, and stool softeners.  ALLERGIES:  No known drug allergies.  SOCIAL HISTORY:  The patient is married, with 2 children.  She is a nonsmoker, nondrinker.  No history of illicit drug usage.  FAMILY HISTORY:  Positive for a brother who had colon cancer at age 8.  REVIEW OF SYSTEMS:  Pertinent as mentioned above in the HPI.  PHYSICAL EXAMINATION FINDINGS:  GENERAL:  This is a 45 year old thin but well-developed [place] American female, who is in no visible discomfort or acute distress currently.  She is tearful, however. VITAL SIGNS:  Temperature 98.2, blood pressure 168/88, heart rate 82, respirations 18, O2 sats 97%.HEENT:  Normocephalic, atraumatic.  Pupils equally round, reactive to light.  Extraocular movements are intact.  Funduscopic benign.  There is no scleral icterus.  Nares are patent bilaterally.  Oropharynx is clear. NECK:  Supple.  Full range of motion.  No thyromegaly, adenopathy, jugular venous distention. CHEST:  Chest wall nontender.  The patient has an evidence of a surgical right mastectomy. CARDIOVASCULAR:  Regular rate and rhythm.  No murmurs, gallops, rubs appreciated. LUNGS:  Clear to auscultation bilaterally.  No rales, rhonchi, or wheezes. ABDOMEN:  Positive bowel sounds.  Soft, nontender, nondistended. EXTREMITIES:  Without cyanosis, clubbing, or edema. NEUROLOGIC EXAMINATION:  There are no focal deficits on this examination at this time, and her gait  is steady.  She is able to walk without difficulty.  She denies any pain at this time in her back.  There is no deficit of sensation or motor function at this time.  Please also note, the patient denies having any loss of control of her bowels or bladder.  LABORATORY STUDIES:  White blood cell count 6.3, hemoglobin 11.8, hematocrit 35.1, platelets 302, neutrophils 76%, and lymphocytes 17%. Protime 13.4, INR 1.0.  Sodium 138, potassium 3.5, chloride 99, carbon dioxide 32, BUN 9, creatinine 0.63, glucose 99, albumin 3.5, AST 24, ALT 13, ammonia level 33.  Acute abdominal series reveals a calcified uterine fibroid and suspected small left renal calculi.  CT of the abdomen and pelvis with contrast as mentioned above in the HPI.  ASSESSMENT:  A 45 year old female being admitted with: 1. 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 2. Metastatic lesions to the liver. 3. Metastatic breast cancer stage IV. 4. Chronic pain syndrome. 5. Nephrolithiasis. 6. Elevated blood pressure with no previous history of hypertension. 7. Uterine fibroids.  PLAN:  The patient will be [facility] to the [facility]. At this particular time, a stat MRI has been ordered to evaluate the T10 and T11 lesion further for possible consideration for surgical intervention.  This was recommended by Dr. Newell Coral, the consultant who will be seeing this patient.  Based on these results, he will consult as well with Oncology and Radiation Oncology for consideration of surgical options.  If this patient can proceed to have spinal cord decompression surgery, she will be transferred to [facility].  The patient  will continue on the IV Decadron therapy as mentioned above, and pain  control therapy has also been ordered as well.  She is n.p.o. at this time to undergo her studies and in the event that she will need to have the surgical intervention.  IV  fluids have been ordered for maintenance therapy at this time.  Antiemetic therapy has also been ordered as needed.  The patient's electrolytes will be monitored as well and corrected as needed.  Currently, the patient is a full code.  The Prognosis and Options has been explained to the patient and her  family members, her husband, her sister, and her daughter who were at  the bedside.  Of note,Dr. Maryln Gottron, the Radiation Oncology is  currently seeing the patient.      Della Goo, M.D.     HJ/MEDQ  D:  03/21/2011  T:  03/21/2011  Job:  [id number]  Electronically Signed by Della Goo M.D. on 03/21/2011 07:56:09 PM

## 2011-03-22 ENCOUNTER — Other Ambulatory Visit: Payer: Self-pay | Admitting: Physician Assistant

## 2011-03-22 DIAGNOSIS — C50919 Malignant neoplasm of unspecified site of unspecified female breast: Secondary | ICD-10-CM

## 2011-03-22 LAB — CBC
HCT: 28.9 % — ABNORMAL LOW (ref 36.0–46.0)
Hemoglobin: 9.7 g/dL — ABNORMAL LOW (ref 12.0–15.0)
MCH: 28.5 pg (ref 26.0–34.0)
MCHC: 33.6 g/dL (ref 30.0–36.0)
MCV: 85 fL (ref 78.0–100.0)
Platelets: 263 10*3/uL (ref 150–400)
RBC: 3.4 MIL/uL — ABNORMAL LOW (ref 3.87–5.11)
RDW: 13.3 % (ref 11.5–15.5)
WBC: 12 10*3/uL — ABNORMAL HIGH (ref 4.0–10.5)

## 2011-03-22 LAB — PREPARE FRESH FROZEN PLASMA: Unit division: 0

## 2011-03-22 LAB — BASIC METABOLIC PANEL
BUN: 10 mg/dL (ref 6–23)
CO2: 29 mEq/L (ref 19–32)
Calcium: 10.5 mg/dL (ref 8.4–10.5)
Chloride: 101 mEq/L (ref 96–112)
Creatinine, Ser: 0.69 mg/dL (ref 0.50–1.10)
GFR calc Af Amer: >90 mL/min (ref >90)
GFR calc non Af Amer: >90 mL/min (ref >90)
Glucose, Bld: 133 mg/dL — ABNORMAL HIGH (ref 70–99)
Potassium: 4.8 mEq/L (ref 3.5–5.1)
Sodium: 138 mEq/L (ref 135–145)

## 2011-03-23 DIAGNOSIS — C50919 Malignant neoplasm of unspecified site of unspecified female breast: Secondary | ICD-10-CM

## 2011-03-23 LAB — CBC
HCT: 28.1 % — ABNORMAL LOW (ref 36.0–46.0)
Hemoglobin: 9.4 g/dL — ABNORMAL LOW (ref 12.0–15.0)
MCH: 28.2 pg (ref 26.0–34.0)
RBC: 3.33 MIL/uL — ABNORMAL LOW (ref 3.87–5.11)

## 2011-03-23 LAB — BASIC METABOLIC PANEL
BUN: 15 mg/dL (ref 6–23)
CO2: 29 mEq/L (ref 19–32)
Glucose, Bld: 131 mg/dL — ABNORMAL HIGH (ref 70–99)
Potassium: 4.3 mEq/L (ref 3.5–5.1)
Sodium: 139 mEq/L (ref 135–145)

## 2011-03-24 NOTE — Consult Note (Signed)
NAMEKORAH, HUFSTEDLER NO.:  [id number]  MEDICAL RECORD NO.:  [id number]  LOCATION:  3105                         FACILITY:  [facility]  PHYSICIAN:  Hewitt Shorts, M.D.DATE OF BIRTH:  1966-05-31  DATE OF CONSULTATION:  03/21/2011 DATE OF DISCHARGE:                                CONSULTATION   HISTORY OF PRESENT ILLNESS:  The patient is a 45 year old right handed black female with history of metastatic breast cancer for whom neurosurgical consultation was requested regarding metastasis at T9, T10, and T11 vertebrae.  Her history is notable for having undergone a right mastectomy with lymph node dissection in November 2010 by Dr. Claud Kelp.  She apparently had negative lymph nodes and positive hormone receptors.  She apparently was started on tamoxifen, but she reports that she did not tolerate it due to nausea and weight loss.  More recently, she has had low back pain.  For the past 2 months, she has been treated with oral morphine.  She did have a CT scan of the abdomen and pelvis last month that revealed metastasis to the liver and also to the T10 and 11 vertebrae.  She presented to the [facility] earlier today because of constipation.  Another CT scan of the abdomen and pelvis was performed. This revealed progression of her tumors.  The tumor in the liver measures approximately 7 x 10 cm.  The metastasis in the lower thoracic spine appears to have enlarged.  In the interval, it appeared to be causing spinal cord compression.  Further workup was done today with MRI scans of the thoracic and lumbar spine.  It shows in fact tumor involvement of the T9, T10, and T11 vertebrae, most involving the posterior elements, but extending into the right neural foramen at T9-10 and T10-11 with significant spinal cord compression and possibly some altered signal within the spinal cord.  The patient's radiation oncologist is Dr. Ruthann Cancer.   The patient's radiation oncologist is Dr. [name].  The [facility] hospitalist, Dr. Lovell Sheehan who admitted her earlier today consulted Dr. Pierce Crane who was on-call for Dr. Darnelle Catalan and Dr. Chipper Herb who is on-call for Radiation Oncology.  Dr. Dayton Scrape did see the patient.  He recommended surgical decompression with postoperative radiation therapy.  The patient was therefore transferred to [facility] on the [facility] for further care and she is seen now in neurosurgical consultation at their request.  Symptomatically, the patient denies any numbness, paresthesias, or weakness.  She denies any fall or bladder incontinence, but she does describe constipation.  PAST MEDICAL HISTORY:  Notable for history of metastatic breast cancer, but she denies any history of hypertension, myocardial infarction, diabetes, peptic ulcer disease, or lung disease.  Previous surgery includes right mastectomy and lymph node dissection, as well as Port-A-Cath access surgeries as well as 2 cesarean sections.  She denies allergies to medications. Her only medication at home was oral morphine.  FAMILY HISTORY:  Mother is in her 46s with hypertension.  Father is in his 70s in good health.  SOCIAL HISTORY:  The patient is unmarried, she stopped smoking about a week and a half ago, but had smoked  since age 89, that is for the past 31 years.  She does not drink alcoholic beverages.  She has 2 children, daughter age 402 and a son age 40.  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  REVIEW OF SYSTEMS:  Notable for those described in the history of present illness, past medical history, but is otherwise unremarkable.  PHYSICAL EXAMINATION:  GENERAL:  The patient is a thin black female, in no distress. VITAL SIGNS:  Temperature is 98.2, pulse 63, blood pressure 160/78. LUNGS:  Clear to auscultation.  She has symmetric respiratory excursion. HEART:  Regular rate and rhythm.  Normal S1  and S2.  There is no murmur. ABDOMEN:  Soft.  There is mild tenderness in the right upper quadrant. There is enlargement of the liver, which is palpable below the costal margin and bowel sounds are present. NEUROLOGIC:  Motor exam 5/5 strength to the lower extremities including the iliopsoas, quadriceps, dorsiflexors, and plantar flexors.  Sensation is intact to pinprick through the distal lower extremities to the legs and feet bilaterally.  Reflexes are absent at biceps, brachialis, triceps, quadriceps, gastrocnemius, symmetrical.  Toes are downgoing bilaterally.  She is able to stand and walk.  IMPRESSION:  Metastatic breast cancer with metastasis to the thoracic spine involving the T9, T10, and T11 vertebrae with spinal cord compression as well as metastasis to the liver.  RECOMMENDATIONS:  Discussed my assessment and impression with the patient as well as with her daughter, sister, and brother-in-law.  We discussed the overall extent of her disease, the recommendation for thoracic laminectomy for resection of tumor and decompression of spinal cord followed by postoperative radiation therapy and we discussed the risks including risks of infection, bleeding, possible need for transfusion, risk of nerve dysfunction, pain, weakness, numbness, paresthesia, risk of spinal cord dysfunction with paralysis of her lower extremities, and bowel and bladder dysfunction, anesthetic risk, myocardial infarction, stroke, pneumonia, and death.  I did explain to them the importance of postoperative radiation therapy, and the much greater likelihood of recurrence of tumor without postoperative radiation therapy.  Understanding all of this, she wished to proceed with surgery.  Both her questions as well as the family's questions were answered for them.     Hewitt Shorts, M.D.     RWN/MEDQ  D:  03/21/2011  T:  03/21/2011  Job:  [id number]  Electronically Signed by Shirlean Kelly M.D. on  03/24/2011 10:09:26 AM

## 2011-03-24 NOTE — Op Note (Signed)
NAMEMELLIE, BUCCELLATO NO.:  [id number]  MEDICAL RECORD NO.:  [id number]  LOCATION:                                 FACILITY:  PHYSICIAN:  Hewitt Shorts, M.D.DATE OF BIRTH:  18-May-1966  DATE OF PROCEDURE:  03/21/2011 DATE OF DISCHARGE:                              OPERATIVE REPORT   PREOPERATIVE DIAGNOSIS:  Metastatic breast cancer to T9, T10, and T11 vertebrae with spinal cord compression.  POSTOPERATIVE DIAGNOSIS:  Metastatic breast cancer to T9, T10, and T11 vertebrae with spinal cord compression.  PROCEDURE:  T9-11 thoracic laminectomy and resection of paravertebral, vertebral, and epidural tumor with decompression of the spinal cord.  SURGEON:  Hewitt Shorts, MD  ASSISTANT:  Kathaleen Maser. Pool, MD  ANESTHESIA:  General endotracheal.  INDICATION:  The patient is a 45 year old woman who has a history of metastatic breast cancer with known metastasis to the thoracic spine and to the liver.  She presented to the emergency room complaining of constipation and rectal discomfort.  She was studied with CT that revealed that the tumors have enlarged, and MRI scan of the thoracic and lumbar spine showed tumor involvement of the T9, T10, and T11 vertebrae with extension of tumor into the dorsal soft tissues and with significant dorsal epidural tumor with spinal cord compression, both involvements extending into the vertebral bodies as well at each level. The decision was made to proceed with decompression of the spinal cord via thoracic laminectomy.  PROCEDURE:  The patient was brought to the operating room and placed under general endotracheal anesthesia.  The patient was turned to prone position.  The thoracolumbar region was prepped with Betadine soap and solution and draped in a sterile fashion.  An x-ray was taken and the T9- 11 region identified and the midline incision was infiltrated with local anesthetic with epinephrine.  Midline incision was  made and carried down through subcutaneous tissue.  Bipolar cautery and electrocautery were used to maintain hemostasis.  Dissection was carried down to the dorsolumbar fascia, which was incised bilaterally in the paraspinal muscles, which were dissected from the spinous process and lamina. There was tumor involvement in the dorsal soft tissues, and there was bleeding as we divided through those tumor infiltrates exposing the lamina of T9, 10, and 11.  Another x-ray was taken to confirm the localization and then with loupe magnification, we proceeded with decompression removing the spinous process of T9, 10, and 11 that were all infiltrated with tumor.  We also removed the lamina of T9, 10, and 11 partially with a curette, partially with Kerrison punch, and partially with high-speed drill.  Specimens of tumor infiltrated. Dorsal soft tissues, posterior elements were sent to pathology for specimen.  We then carefully removed the ligamentum flavum as well and immediately visualized the epidural tumor.  This was carefully lifted off of the dura and removed in a piecemeal fashion and specimens of the epidural tumor were sent as a separate specimen for pathology.  We removed the dorsal epidural tumor up to the superior extent as well down as its inferior extent.  The tumor did extend laterally into the neural foramen.  There was some bleeding from the  tumor extending into the neural foramen to the right-sided T9 and 10.  This was controlled with Gelfoam with thrombin and in the end good decompression of the thecal sac and spinal canal was achieved.  Hemostasis was established with the use of Gelfoam with thrombin and Surgifoam and then once decompression was completed, hemostasis was established and we proceeded with closure. A medium Hemovac drain was placed in the laminectomy defect and brought out through a separate stab incision and connected to a closed collection system.  The  paracervical musculature was approximated with interrupted undyed 0 Vicryl sutures, deep fascia closed with interrupted undyed 0 Vicryl sutures, subcutaneous and subcuticular closed with interrupted and inverted 2-0 undyed Vicryl sutures, skin was approximated with Dermabond.  The drain was sutured in place with 3-0 nylon suture and the wound was dressed with sterile gauze and hyperflexed.  Procedure was tolerated well.  Estimated blood loss was 600 mL.  Sponge and needle count correct.  We did monitor the patient's hemoglobin and hematocrit through the procedure and it remained relatively stable.  Therefore, no blood transfusion was done.  However, we did transfuse 2 units of fresh frozen plasma due to the fact that the patient had a mildly elevated PTT and we had moderate bleeding during the tumor resection.  Following surgery, the patient was turned back to supine position, reversed from the anesthetic, extubated and transferred to the Neurosurgical Intensive Care Unit for further care where she was noted to be awakening, following commands, and moving all 4 extremities to command.     Hewitt Shorts, M.D.     RWN/MEDQ  D:  03/21/2011  T:  03/21/2011  Job:  [id number]  Electronically Signed by Shirlean Kelly M.D. on 03/24/2011 10:09:36 AM

## 2011-03-25 LAB — DIFFERENTIAL
Lymphocytes Relative: 9 % — ABNORMAL LOW (ref 12–46)
Monocytes Absolute: 0.7 10*3/uL (ref 0.1–1.0)
Monocytes Relative: 7 % (ref 3–12)
Neutro Abs: 8.7 10*3/uL — ABNORMAL HIGH (ref 1.7–7.7)

## 2011-03-25 LAB — URINALYSIS, ROUTINE W REFLEX MICROSCOPIC
Bilirubin Urine: NEGATIVE
Glucose, UA: NEGATIVE mg/dL
Hgb urine dipstick: NEGATIVE
Ketones, ur: NEGATIVE mg/dL
Protein, ur: NEGATIVE mg/dL

## 2011-03-25 LAB — CBC
HCT: 25.5 % — ABNORMAL LOW (ref 36.0–46.0)
Hemoglobin: 8.8 g/dL — ABNORMAL LOW (ref 12.0–15.0)
MCH: 28.6 pg (ref 26.0–34.0)
MCHC: 34.5 g/dL (ref 30.0–36.0)
MCV: 82.8 fL (ref 78.0–100.0)

## 2011-03-25 LAB — URINE MICROSCOPIC-ADD ON

## 2011-03-26 LAB — BASIC METABOLIC PANEL
BUN: 11 mg/dL (ref 6–23)
CO2: 28 mEq/L (ref 19–32)
Calcium: 9.1 mg/dL (ref 8.4–10.5)
Chloride: 101 mEq/L (ref 96–112)
Creatinine, Ser: 0.69 mg/dL (ref 0.50–1.10)

## 2011-03-26 LAB — CBC
HCT: 24.6 % — ABNORMAL LOW (ref 36.0–46.0)
MCH: 28.1 pg (ref 26.0–34.0)
MCV: 83.4 fL (ref 78.0–100.0)
Platelets: 348 10*3/uL (ref 150–400)
RDW: 13.4 % (ref 11.5–15.5)
WBC: 7.2 10*3/uL (ref 4.0–10.5)

## 2011-03-26 LAB — URINE CULTURE: Colony Count: 65000

## 2011-03-27 NOTE — Discharge Summary (Signed)
[name], [name] NO.:  [id number]  MEDICAL RECORD NO.:  [id number]  LOCATION:  3016                         FACILITY:  [facility]  PHYSICIAN:  Isidor Holts, M.D.  DATE OF BIRTH:  1965-10-05  DATE OF ADMISSION:  03/21/2011 DATE OF DISCHARGE:                              DISCHARGE SUMMARY   PRIMARY HEMATOLOGIST/ONCOLOGIST:  Lowella Dell, MD  DISCHARGE DIAGNOSES: 1. History of invasive ductal carcinoma right breast, pathologic stage     T3, N1, MI status post mastectomy/lymph node dissection and     chemotherapy in February 2011. 2. Metastases to T9, T10, T11, with cord compression, status post T9-     T11 thoracic laminectomy and resection of paravertebral and     epidural tumor, with decompression of spinal cord March 21, 2011. 3. Constipation. 4. History of urolithiasis. 5. Urinary tract infection.  DISCHARGE MEDICATIONS: 1. Ciprofloxacin 500 mg p.o. b.i.d. for 5 days from March 27, 2011. 2. Cyclobenzaprine 10 mg p.o. t.i.d. p.r.n. for muscle spasms. 3. Oxycodone/APAP 5/325 (1 tablet p.o. p.r.n. q.4 hourly for pain), a     total of 120 pills have been dispensed. 4. Simethicone 80 mg p.o. p.r.n. q.i.d. for bloating and flatulence. 5. Ibuprofen 400 mg p.o. p.r.n. q.8 hourly for breakthrough pain. 6. Methadone 5 mg p.o. t.i.d. 7. MiraLax 17 g p.o. p.r.n. daily for constipation. 8. Zofran 4 mg p.o. p.r.n. b.i.d. for nausea.  PROCEDURES: 1. Abdominal/chest x-ray, March 21, 2011.  This showed calcified     uterine fibroids, suspected small left renal calculi. 2. Abdominal/pelvic CT scan March 21, 2011.  This showed interval     increase in size of multiple large hepatic masses demonstrating     heterogeneous enhancement compatible with metastatic disease.     Also, enhancing soft tissue mass noted eroding into the right side     and posterior elements of the vertebral bodies of T10 and T11.     This is better characterized due to contrast.   Enhancing mass seen     completely surrounding the spinal cord and compression of the     spinal cord at the level of T10 to T11.  Trace nonspecific fluid     noted about the splenic flexure of the colon.  No definite evidence     for omental metastases.  Enlarged fibroid uterus noted.  Mild left     renal scarring and nonobstructing small left renal stone seen. 3. MRI T-spine March 21, 2011.  This showed thoracic metastatic     disease with epidural extension and foraminal extension causing     cord compression in T9-T11.  Question small metastatic foci T4, T5,     and L1 cervical spondylitic changes in the lower cervical spine. 4. MRI of the lumbar spine March 21, 2011.  This showed bony     metastatic involvement suspected involving portions of L1, L2, and     L3, and possibly upper sacrum.  No epidural extension of tumor in     the lumbar region.  No loss of vertebral body height. 5. X-ray of thoracic spine on March 21, 2011.  This showed T10     marked intraoperatively.  CONSULTATIONS: 1. Hewitt Shorts, MD neuro surgeon. 2. Lowella Dell, MD oncologist.  ADMISSION HISTORY:  As in H and P notes of March 21, 2011, dictated by Dr. Della Mullins.  However in brief, this is a 45 year old female, with known history of invasive ductal carcinoma of the right breast pathologic stage T3 N1 MRI diagnosed November, 2011, status post mastectomy with lymph node dissection and chemotherapy in February 2011, under the care of Dr. Ruthann Mullins, presenting with lower abdominal discomfort, constipation, rest of dissatisfaction.  The patient reportedly had been evaluated on February 24, 2011, found to have progressive malignancy with widespread metastatic disease, and was started on a chemotherapy regimen approximately 1 week ago.  She was admitted for further evaluation, investigation, and management.  CLINICAL COURSE: 1. Advanced metastatic disease.  As described above,  the patient has a     known history of right breast carcinoma, previously     treated with mastectomy and lymph node dissection.  For findings on     imaging studies during this hospitalization, refer to procedure     list above.  However, these demonstrated interval increase in size     of multiple large hepatic masses, compatible with metastatic     disease and also soft tissue mass eroding into the right side and     posterior elements of vertebral body of T10 and T11.  There were     also known bony metastatic involvement suspected involving portions     of L1, L2, and L3, and possibly upper sacrum.  The patient was seen     by Dr. Ruthann Mullins, on consultation during this     hospitalization.  He has already scheduled an appointment to see     the patient on March 30, 2011.  She will keep this appointment     following discharge, but as described above, chemotherapy had     already been commenced 1 week prior to this presentation.  Options     will be reconsidered at the time of the patient's next evaluation.  2. Metastatic disease to T9, T10, and T11/cord compression.  Imaging     studies demonstrated metastatic disease involving the T9, T10, and     T11 vertebral spine with cord compression.  Neurosurgical     consultation was kindly provided by Dr. Shirlean Kelly, who     performed decompressive surgery on March 21, 2011, in an     uncomplicated procedure. As of March 26, 2011, the patient was on     postoperative day number 5, was ambulating and appeared to be     recovering nicely from her surgical procedure.  3. Constipation.  The patient preadmission, was complaining of     abdominal discomfort and constipation.  This is possibly due to the     patient's malignant disease, as well as opiate medication.  She was     managed with laxatives during the course of this hospitalization     and stool softeners, with satisfactory clinical response.  On     March 26, 2011, she complained of flatulence and simethicone was     added to her medications.  4. History of urolithiasis.  This was again confirmed on imaging     studies, which showed nonobstructive small left renal stone.  The     patient had no symptoms referable to this.  5. Urinary tract infection.  The patient did have a positive urinary  sediment, consistent with urinary tract infection.  She was     commenced on a 7-day course of ciprofloxacin, to be completed on     March 31, 2011.  DISPOSITION:  The patient was on March 26, 2011, considered clinically stable for discharge.  She was ambulating and managed to move her bowels, was evaluated by PT/OT and recommended home health PT/OT.  The patient was therefore discharged accordingly.  ACTIVITY:  As tolerated.  Recommended to increase activity slowly; otherwise, per PT/OT.  DIET:  No restrictions.  FOLLOW UP INSTRUCTIONS:  The patient will follow up with Dr. Shirlean Kelly, neurosurgeon, date to be determined.  She is instructed to call for an appointment, telephone #[phone].  She will follow up with Dr. Ruthann Mullins, an appointment has already been scheduled for March 30, 2011, at 9 a.m., telephone [phone].  In addition, the patient has been scheduled an appointment with Dr. Michell Heinrich, radiation oncologist, telephone [phone], scheduled for April 08, 2011, at 1 p.m.  An appointment has been scheduled with Dr. Andrey Campanile at [place], telephone [phone] for May 20, 2011, at 2:30 p.m.  SPECIAL INSTRUCTIONS:  Home health and PT/OT has been arranged.  In addition, [facility] eligibility appointment is scheduled for May 15, 2011, at 2:15 p.m.  All this has been communicated to the patient. She is verbalized understanding.     Isidor Holts, M.D.     CO/MEDQ  D:  03/26/2011  T:  03/26/2011  Job:  [id number]  cc:   Dr. Virgina Evener, M.D. Hewitt Shorts, M.D. [name], M.D.  Electronically Signed by Isidor Holts M.D. on 03/27/2011 06:50:23 PM

## 2011-03-30 ENCOUNTER — Other Ambulatory Visit: Payer: Self-pay | Admitting: Oncology

## 2011-03-30 ENCOUNTER — Encounter (HOSPITAL_BASED_OUTPATIENT_CLINIC_OR_DEPARTMENT_OTHER): Payer: Self-pay | Admitting: Oncology

## 2011-03-30 DIAGNOSIS — M549 Dorsalgia, unspecified: Secondary | ICD-10-CM

## 2011-03-30 DIAGNOSIS — C50519 Malignant neoplasm of lower-outer quadrant of unspecified female breast: Secondary | ICD-10-CM

## 2011-03-30 DIAGNOSIS — C7952 Secondary malignant neoplasm of bone marrow: Secondary | ICD-10-CM

## 2011-03-30 DIAGNOSIS — R110 Nausea: Secondary | ICD-10-CM

## 2011-03-30 DIAGNOSIS — Z5111 Encounter for antineoplastic chemotherapy: Secondary | ICD-10-CM

## 2011-03-30 DIAGNOSIS — Z170 Estrogen receptor positive status [ER+]: Secondary | ICD-10-CM

## 2011-03-30 DIAGNOSIS — C787 Secondary malignant neoplasm of liver and intrahepatic bile duct: Secondary | ICD-10-CM

## 2011-03-30 LAB — COMPREHENSIVE METABOLIC PANEL
ALT: 39 U/L — ABNORMAL HIGH (ref 0–35)
AST: 28 U/L (ref 0–37)
CO2: 25 mEq/L (ref 19–32)
Calcium: 9.3 mg/dL (ref 8.4–10.5)
Chloride: 100 mEq/L (ref 96–112)
Creatinine, Ser: 0.84 mg/dL (ref 0.50–1.10)
Potassium: 3.9 mEq/L (ref 3.5–5.3)
Sodium: 135 mEq/L (ref 135–145)
Total Protein: 6.3 g/dL (ref 6.0–8.3)

## 2011-03-30 LAB — URINALYSIS, MICROSCOPIC - CHCC
Bilirubin (Urine): NEGATIVE
Glucose: NEGATIVE g/dL
Ketones: NEGATIVE mg/dL
Specific Gravity, Urine: 1.02 (ref 1.003–1.035)
pH: 6 (ref 4.6–8.0)

## 2011-03-30 LAB — CBC WITH DIFFERENTIAL/PLATELET
Basophils Absolute: 0 10*3/uL (ref 0.0–0.1)
Eosinophils Absolute: 0 10*3/uL (ref 0.0–0.5)
HGB: 8.6 g/dL — ABNORMAL LOW (ref 11.6–15.9)
MCV: 85.8 fL (ref 79.5–101.0)
MONO#: 0.3 10*3/uL (ref 0.1–0.9)
NEUT#: 5.1 10*3/uL (ref 1.5–6.5)
RBC: 2.95 10*6/uL — ABNORMAL LOW (ref 3.70–5.45)
RDW: 13.9 % (ref 11.2–14.5)
WBC: 6.8 10*3/uL (ref 3.9–10.3)
lymph#: 1.3 10*3/uL (ref 0.9–3.3)

## 2011-04-07 ENCOUNTER — Encounter: Payer: Self-pay | Admitting: *Deleted

## 2011-04-07 DIAGNOSIS — C7951 Secondary malignant neoplasm of bone: Secondary | ICD-10-CM | POA: Insufficient documentation

## 2011-04-08 ENCOUNTER — Ambulatory Visit
Admit: 2011-04-08 | Discharge: 2011-04-08 | Disposition: A | Discharge status: Home / Self Care | Payer: Medicaid Other | Attending: Radiation Oncology | Admitting: Radiation Oncology

## 2011-04-08 ENCOUNTER — Ambulatory Visit: Payer: Self-pay

## 2011-04-08 ENCOUNTER — Encounter: Payer: Self-pay | Admitting: Radiation Oncology

## 2011-04-08 DIAGNOSIS — C7951 Secondary malignant neoplasm of bone: Secondary | ICD-10-CM

## 2011-04-08 DIAGNOSIS — Z510 Encounter for antineoplastic radiation therapy: Secondary | ICD-10-CM | POA: Insufficient documentation

## 2011-04-08 DIAGNOSIS — C7952 Secondary malignant neoplasm of bone marrow: Secondary | ICD-10-CM | POA: Insufficient documentation

## 2011-04-08 DIAGNOSIS — C50919 Malignant neoplasm of unspecified site of unspecified female breast: Secondary | ICD-10-CM | POA: Insufficient documentation

## 2011-04-08 DIAGNOSIS — C787 Secondary malignant neoplasm of liver and intrahepatic bile duct: Secondary | ICD-10-CM | POA: Insufficient documentation

## 2011-04-08 DIAGNOSIS — M549 Dorsalgia, unspecified: Secondary | ICD-10-CM | POA: Insufficient documentation

## 2011-04-08 DIAGNOSIS — K5900 Constipation, unspecified: Secondary | ICD-10-CM | POA: Insufficient documentation

## 2011-04-08 NOTE — Progress Notes (Signed)
Please see the Nurse Progress Note in the MD Initial Consult Encounter for this patient. 

## 2011-04-08 NOTE — Progress Notes (Signed)
CSW met with pt and pt's dtr in office today regarding her disability application. CSW referred pt to [facility]'s disability assistance program and arranged appt with Gwenevere Abbot for 04/13/11 after [facility] appts. Pt also completed [employer] form to receive assistance this Christmas. Kathrin Penner, LCSWA

## 2011-04-09 NOTE — Progress Notes (Signed)
CC:   Hewitt Shorts, M.D. Lowella Dell, M.D. Angelia Mould. Derrell Lolling, M.D.  DIAGNOSIS:  Metastatic breast cancer.  PREVIOUS INTERVENTIONS: 1. Radiation to her right chest wall and regional lymph nodes to a     total dose of 32.4 Gy completed 09/16/2010. 2. Resection of an epidural tumor and T10 vertebral body on 03/21/2011     revealing metastatic breast cancer.  INTERVAL HISTORY:  Lalania reports for followup today.  She was seen by my partner, Dr. Dayton Scrape, when she was an inpatient and presented with spinal cord compression.  She reports that she was really just having tremendous back pain.  She denies any problems with movement or urinary difficulty.  She was having some constipation but she thought that was secondary to the pain medications.  She unfortunately developed recently metastatic disease and was seen by Dr. Darnelle Catalan.  She had started on Faslodex.  A CT scan of the chest on 03/21/2011 showed increase in size and multiple liver metastases along with a soft tissue mass eroding the right side posterior elements of the vertebral bodies at T10 and T11. An enhancing mass was noted to surround the entire spinal cord with compression at the level of T10 and T11.  Dr. Newell Coral was consulted and resected this mass.  The pathology was consistent with metastatic breast cancer.  Isabel has recovered well and presents today with her daughter and grandson.  She reports that her pain is well controlled.  She is taking Flexeril as well as 10 mg of OxyContin b.i.d. and Percocet 10/325 as she needs it.  She reports her constipation has resolved and she is not having any difficulties with urination or lower extremity weakness. on.  PHYSICAL EXAMINATION:  General:  She is a pleasant female in no distress sitting comfortably on the exam room table.  HEENT:  Pupils are equal are equal.  She is alert and oriented x3.  Vital Signs:  Weight 121 pounds.  Height 5 feet 2 inches.  Blood  pressure 113/67, pulse 84, temperature 99.2.  Lymphatic:  She has no palpable cervical or supraclavicular adenopathy.  Neurologic:  Her strength is 5/5 in her bilateral upper and lower extremities.  She has intact sensation to light touch throughout her bilateral upper and lower extremities as well.  She has a normal gait.  Her incision is well-healed on her back.  IMPRESSION:  45 year old female with metastatic breast cancer and cord compression status post surgical decompression.  RECOMMENDATIONS:  Tanazia has healed up well from her surgery.  She has significantly decreased her pain.  We discussed that despite the surgical treatment there were still cells left behind and we would treat those with adjuvant radiation.  I have scheduled her for simulation later today.  We discussed the process of simulation and the use of posterior oblique fields to avoid her previous chest radiation.  We discussed 10 treatments as an outpatient to begin next week.  She has followup with Dr. Newell Coral scheduled and will continue her Fosamax under the care of Dr. Darnelle Catalan.    ______________________________ [name], M.D. SW/MEDQ  D:  04/09/2011  T:  04/09/2011  Job:  [id number]

## 2011-04-09 NOTE — Progress Notes (Signed)
[facility] Radiation Oncology Simulation and Treatment Planning Note   Name: [name] MRN: [id number]  Date: 04/09/2011  DOB: 13-Apr-1966  Status:outpatient    DIAGNOSIS: Metatstatic breast cancer to spine.    SIDE: right   CONSENT VERIFIED:yes   SET UP: Patient is setup supine   IMMOBILIZATION: Following immobilization was used wing board   NARRATIVE:The patient was brought to the CT Simulation planning suite.  Identity was confirmed.  All relevant records and images related to the planned course of therapy were reviewed.  Then, the patient was positioned in a stable reproducible clinical set-up for radiation therapy.  CT images were obtained.  Skin markings were placed.  The CT images were loaded into the planning software where the target and avoidance structures were contoured.  The radiation prescription was entered and confirmed.   TREATMENT PLANNING NOTE:  Treatment planning then occurred. MLCs, isodose plan and basic dose calculation was requested.  I have requested : This treatment constitutes a Special Treatment Procedure for the following reason: [ Retreatment in a previously radiated area requiring careful monitoring of increased risk of toxicity due to overlap of previous treatment.

## 2011-04-10 ENCOUNTER — Encounter: Payer: Self-pay | Admitting: *Deleted

## 2011-04-10 NOTE — Progress Notes (Signed)
Encounter addended by: Tessa Lerner, RN on: 04/10/2011  4:56 PM<BR>     Documentation filed: Charges VN

## 2011-04-10 NOTE — Progress Notes (Signed)
Clinical Social Work was referred by distress screening protocol. Patient scored a 10 on the Psychosocial Distress Thermometer which indicates severe distress. Clinical Social Worker met with pt in office prior to radiation oncology visit to assess for distress and other psychosocial needs. CSW will send Distress thermometer to medical records to be scanned in.  Patient's knowledge about cancer and its treatment including level of understanding, reactions, goals for care, and expectations: Pt expressed understanding regarding current treatment plan.  Pt has completed radiation treatment in the past. Pt did not express emotional distress at visit.  Characteristics of the patient's support system: Pt was accompanied by her daughter and grandson to CSW visit today. Pt identifies her daughter as her largest support at this time.  Pt and dtr completed giving tree application to help receive assistance over the holidays.  Patient and family psychosocial functioning including strengths, limitations, and coping skills: CSW and pt briefly discussed pt's current treatment plan and outcomes. Pt relies heavily on a positive attitude and her faith. Pt states, "I am going to be okay".  Identifications of barriers to care: Historically, one of pt's largest barriers is transportation. Pt typically receives transportation to [facility] by bus or by family and friends. Pt received 31 day bus pass during last radiation treatment period.  Availability of community resources: CSW has referred pt to [facility] for disability assistance. Due to pt's current diagnosis, pt able to apply for SSDI.   Clinical Social Worker follow up needed: Yes  If yes, follow up plan: CSW attempted to contact pt to follow up regarding her distress thermometer and to encourage pt to reapply for Medicaid. CSW awaiting call from pt.

## 2011-04-10 NOTE — Progress Notes (Signed)
Encounter addended by: Chabeli Barsamian Marie Cleotha Tsang, RN on: 04/10/2011  4:56 PM<BR>     Documentation filed: Charges VN

## 2011-04-13 ENCOUNTER — Encounter: Payer: Self-pay | Admitting: Physician Assistant

## 2011-04-13 ENCOUNTER — Other Ambulatory Visit (HOSPITAL_BASED_OUTPATIENT_CLINIC_OR_DEPARTMENT_OTHER): Payer: Medicaid Other

## 2011-04-13 ENCOUNTER — Ambulatory Visit (HOSPITAL_BASED_OUTPATIENT_CLINIC_OR_DEPARTMENT_OTHER): Payer: Medicaid Other

## 2011-04-13 ENCOUNTER — Encounter: Payer: Self-pay | Admitting: *Deleted

## 2011-04-13 ENCOUNTER — Other Ambulatory Visit: Payer: Self-pay | Admitting: Oncology

## 2011-04-13 ENCOUNTER — Ambulatory Visit (HOSPITAL_BASED_OUTPATIENT_CLINIC_OR_DEPARTMENT_OTHER): Payer: Medicaid Other | Admitting: Physician Assistant

## 2011-04-13 DIAGNOSIS — C7952 Secondary malignant neoplasm of bone marrow: Secondary | ICD-10-CM

## 2011-04-13 DIAGNOSIS — Z5111 Encounter for antineoplastic chemotherapy: Secondary | ICD-10-CM

## 2011-04-13 DIAGNOSIS — C7951 Secondary malignant neoplasm of bone: Secondary | ICD-10-CM

## 2011-04-13 DIAGNOSIS — C50919 Malignant neoplasm of unspecified site of unspecified female breast: Secondary | ICD-10-CM

## 2011-04-13 DIAGNOSIS — N898 Other specified noninflammatory disorders of vagina: Secondary | ICD-10-CM

## 2011-04-13 DIAGNOSIS — C50519 Malignant neoplasm of lower-outer quadrant of unspecified female breast: Secondary | ICD-10-CM

## 2011-04-13 DIAGNOSIS — C787 Secondary malignant neoplasm of liver and intrahepatic bile duct: Secondary | ICD-10-CM

## 2011-04-13 LAB — COMPREHENSIVE METABOLIC PANEL
AST: 22 U/L (ref 0–37)
Albumin: 3.5 g/dL (ref 3.5–5.2)
BUN: 9 mg/dL (ref 6–23)
CO2: 27 mEq/L (ref 19–32)
Calcium: 9.7 mg/dL (ref 8.4–10.5)
Chloride: 102 mEq/L (ref 96–112)
Creatinine, Ser: 0.75 mg/dL (ref 0.50–1.10)
Glucose, Bld: 71 mg/dL (ref 70–99)
Potassium: 3.6 mEq/L (ref 3.5–5.3)

## 2011-04-13 LAB — CBC WITH DIFFERENTIAL/PLATELET
Basophils Absolute: 0 10*3/uL (ref 0.0–0.1)
EOS%: 1.3 % (ref 0.0–7.0)
Eosinophils Absolute: 0 10*3/uL (ref 0.0–0.5)
HCT: 29.3 % — ABNORMAL LOW (ref 34.8–46.6)
HGB: 9.9 g/dL — ABNORMAL LOW (ref 11.6–15.9)
MCH: 28.1 pg (ref 25.1–34.0)
MONO#: 0.3 10*3/uL (ref 0.1–0.9)
NEUT#: 1.9 10*3/uL (ref 1.5–6.5)
NEUT%: 56.5 % (ref 38.4–76.8)
RDW: 14.4 % (ref 11.2–14.5)
lymph#: 1.1 10*3/uL (ref 0.9–3.3)

## 2011-04-13 MED ORDER — ZOLEDRONIC ACID 4 MG/100ML IV SOLN
4.0000 mg | Freq: Once | INTRAVENOUS | Status: AC
  Administered 2011-04-13: 4 mg via INTRAVENOUS
  Filled 2011-04-13: qty 100

## 2011-04-13 MED ORDER — SODIUM CHLORIDE 0.9 % IV SOLN
Freq: Once | INTRAVENOUS | Status: AC
  Administered 2011-04-13: 13:00:00 via INTRAVENOUS

## 2011-04-13 MED ORDER — FULVESTRANT 250 MG/5ML IM SOLN
500.0000 mg | Freq: Once | INTRAMUSCULAR | Status: AC
  Administered 2011-04-13: 500 mg via INTRAMUSCULAR
  Filled 2011-04-13: qty 10

## 2011-04-13 MED ORDER — OXYCODONE-ACETAMINOPHEN 5-325 MG PO TABS: ORAL_TABLET | ORAL | Status: DC

## 2011-04-13 NOTE — Progress Notes (Signed)
Hematology and Oncology Follow Up Visit  [name] [id number] December 10, 1965 45 y.o. 04/13/2011 3:56 PM   Interim History:   The patient returns today accompanied by her daughter and 2 of her grandchildren, for followup of her metastatic breast carcinoma. She is receiving Faslodex injections monthly, due for her injections today. She receives Zoladex every 3 months, last given one month ago on October 15. Also due to initiate zoledronic acid, to be given on a monthly basis, first dose is scheduled for today.  The patient continues to have quite a bit of back pain. She is status post laminectomy and resection of paravertebral, vertebral, and epidural tumor was spinal cord decompression in October. She tolerated the procedure well, and while it has helped her pain, she continues to have chronic pain for which she is utilizing Percocet. She rates her pain as 6 on a scale of 1-10. She requests a refill on her oxycodone and APAP today.  This is still the patient's primary complaint today. Otherwise, she is having no hot flashes. She does have some vaginal discharge which she tells me is malodorous and yellow. [facility] and would like a referral there for a pelvic exam, for possible vaginal infection. She denies any fevers chills or night sweats. She has had no jaw pain, no recent dental procedures. In fact a detailed review of systems is otherwise noncontributory as noted below.   Review of Systems: Constitutional:  no weight loss, fever, night sweats Eyes: negative  BJY:[id number] Cardiovascular: no chest pain or dyspnea on exertion Respiratory: no cough, shortness of breath, or wheezing Neurological: negative Dermatological: negative Gastrointestinal: no abdominal pain, change in bowel habits, or black or bloody stools Genito-Urinary: no dysuria, trouble voiding, or hematuria Hematological and Lymphatic: negative Breast: negative Musculoskeletal: positive  for - pain in back Remaining ROS negative.  Medications: I have reviewed the patient's current medications.  Allergies: No Known Allergies   Physical Exam:  Blood pressure 109/75, pulse 93, temperature 98.2 F (36.8 C), temperature source Oral, height 5\' 2"  (1.575 m), weight 120 lb 1.6 oz (54.477 kg), last menstrual period 03/02/2011. HEENT:  Sclerae anicteric, conjunctivae pale.  Oropharynx clear.  No mucositis or candidiasis.  Nodes:  No cervical, supraclavicular, or axillary lymphadenopathy palpated.  Breast Exam:Deferred.  Lungs:  Clear to auscultation bilaterally.  No crackles, rhonchi, or wheezes.  Heart:  Regular rate and rhythm.  Abdomen:  Soft, nontender.  Positive bowel sounds.  No frank organomegaly or masses palpated.  Musculoskeletal:  No focal spinal tenderness to gentle palpation.  Extremities:  Benign.  No peripheral edema or cyanosis.  Skin:  Benign.  Neuro:  Nonfocal.   Lab Results: Lab Results  Component Value Date   WBC 7.2 03/26/2011   HGB 9.9* 04/13/2011   HCT 29.3* 04/13/2011   MCV 83.5 04/13/2011   PLT 446* 04/13/2011   NEUTROABS 8.7* 03/25/2011     Chemistry      Component Value Date/Time   NA 138 04/13/2011 1030   K 3.6 04/13/2011 1030   CL 102 04/13/2011 1030   CO2 27 04/13/2011 1030   BUN 9 04/13/2011 1030   CREATININE 0.75 04/13/2011 1030      Component Value Date/Time   CALCIUM 9.7 04/13/2011 1030   ALKPHOS 185* 04/13/2011 1030   AST 22 04/13/2011 1030   ALT 16 04/13/2011 1030   BILITOT 0.2* 04/13/2011 1030          Impression and Plan:  45 year old [place] woman who is status post right mastectomy and axillary lymph node dissection in November of 2010 for a T3 N1 MIC, grade 3, 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 Status post 4 cycles of dose dense doxorubicin and cyclophosphamide, followed by 4 weekly doses of paclitaxel given adjuvantly, been discontinued secondary to peripheral  neuropathy. Status post radiation to the right chest wall, truncated due to problems with the patient missing appointments. Tamoxifen was started in June of 2012, but discontinued due to nausea.  Now with pathologically documented metastatic disease to liver and bone, being treated with Faslodex since 03/16/2011, Zoladex every 3 months having also been started on 03/16/2011, and monthly zoledronic acid to be started today. Status post thoracic laminectomy, T9-T11, for impending cord compression on 03/21/2011. Also due to initiate radiation therapy.  The patient will proceed to treatment today as scheduled for her first dose of monthly zoledronic acid as well as her next monthly injection of Faslodex. She is not due for her next Zoladex until January. I will note that her last serum creatinine was normal on 03/30/2011 at 0.84. The patient is scheduled to initiate radiation therapy later this week. I will see her in 4 weeks for a brief followup prior to her next dose of zoledronic acid and next injection of Faslodex. I did refill her pain medication today as well. Patient voices understanding and agreement and will call with any problems.  Spent more than half the time coordinating care.    Annalisia Ingber, PA-C 11/12/20123:56 PM

## 2011-04-13 NOTE — Progress Notes (Signed)
Encounter addended by: Breanda Greenlaw Marie Haris Baack, RN on: 04/13/2011 10:17 AM<BR>     Documentation filed: Charges VN

## 2011-04-13 NOTE — Patient Instructions (Signed)
[facility]   EDUCATIONAL MATERIALS GIVEN AND REVIEWED: zometa      I have been informed and understand all of the instructions given to me and have received a copy. I have been instructed to call the clinic [phone]  or my family physician as soon as possible for continued medical care, if indicated. I do not have any more questions at this time but understand that I may call the Cancer Center at [phone] during office hours should I have questions or need assistance in obtaining follow-up care.      _________________________________________      _______________     __________ Signature of Patient or Authorized Representative        Date                            Time      _________________________________________ Nurse's Signature

## 2011-04-14 ENCOUNTER — Other Ambulatory Visit: Payer: Self-pay | Admitting: Oncology

## 2011-04-14 ENCOUNTER — Encounter: Payer: Self-pay | Admitting: Certified Registered Nurse Anesthetist

## 2011-04-14 ENCOUNTER — Encounter: Payer: Self-pay | Admitting: Radiation Oncology

## 2011-04-14 ENCOUNTER — Ambulatory Visit
Admission: RE | Admit: 2011-04-14 | Discharge: 2011-04-14 | Disposition: A | Discharge status: Home / Self Care | Payer: Medicaid Other | Source: Ambulatory Visit | Attending: Radiation Oncology | Admitting: Radiation Oncology

## 2011-04-14 DIAGNOSIS — C787 Secondary malignant neoplasm of liver and intrahepatic bile duct: Secondary | ICD-10-CM

## 2011-04-14 DIAGNOSIS — C7951 Secondary malignant neoplasm of bone: Secondary | ICD-10-CM

## 2011-04-14 NOTE — Progress Notes (Signed)
[facility] Radiation Oncology Simulation Verification Note   Name: [name] MRN: [id number]   Date: 04/14/2011  DOB: Jun 22, 1965  Status:outpatient   DIAGNOSIS:  1. Bone metastases     POSITION: Patient was placed in the supine position on the treatment machine.  Isocenter and MLCS were reviewed and treatment was approved.  NARRATIVE: Patient tolerated simulation well.

## 2011-04-15 ENCOUNTER — Other Ambulatory Visit: Payer: Self-pay | Admitting: Oncology

## 2011-04-15 ENCOUNTER — Ambulatory Visit
Admission: RE | Admit: 2011-04-15 | Discharge: 2011-04-15 | Disposition: A | Discharge status: Home / Self Care | Payer: Medicaid Other | Source: Ambulatory Visit | Attending: Radiation Oncology | Admitting: Radiation Oncology

## 2011-04-16 ENCOUNTER — Ambulatory Visit
Admission: RE | Admit: 2011-04-16 | Discharge: 2011-04-16 | Disposition: A | Discharge status: Home / Self Care | Payer: Medicaid Other | Source: Ambulatory Visit | Attending: Radiation Oncology | Admitting: Radiation Oncology

## 2011-04-17 ENCOUNTER — Ambulatory Visit
Admission: RE | Admit: 2011-04-17 | Discharge: 2011-04-17 | Disposition: A | Discharge status: Home / Self Care | Payer: Medicaid Other | Source: Ambulatory Visit | Attending: Radiation Oncology | Admitting: Radiation Oncology

## 2011-04-17 ENCOUNTER — Telehealth: Payer: Self-pay | Admitting: *Deleted

## 2011-04-17 DIAGNOSIS — C50919 Malignant neoplasm of unspecified site of unspecified female breast: Secondary | ICD-10-CM

## 2011-04-17 MED ORDER — OXYCODONE-ACETAMINOPHEN 5-325 MG PO TABS: 2.0000 | ORAL_TABLET | Freq: Four times a day (QID) | ORAL | Status: DC | PRN

## 2011-04-17 NOTE — Telephone Encounter (Signed)
Pt came in for radiation treatment and requested refill on pain medication. Per discussion [name] states

## 2011-04-18 ENCOUNTER — Ambulatory Visit: Payer: Medicaid Other

## 2011-04-20 ENCOUNTER — Ambulatory Visit
Admission: RE | Admit: 2011-04-20 | Discharge: 2011-04-20 | Disposition: A | Discharge status: Home / Self Care | Payer: Medicaid Other | Source: Ambulatory Visit | Attending: Radiation Oncology | Admitting: Radiation Oncology

## 2011-04-21 ENCOUNTER — Telehealth: Payer: Self-pay | Admitting: *Deleted

## 2011-04-21 ENCOUNTER — Ambulatory Visit
Admission: RE | Admit: 2011-04-21 | Discharge: 2011-04-21 | Disposition: A | Discharge status: Home / Self Care | Payer: Medicaid Other | Source: Ambulatory Visit | Attending: Radiation Oncology | Admitting: Radiation Oncology

## 2011-04-21 VITALS — Wt 115.5 lb

## 2011-04-21 DIAGNOSIS — C7951 Secondary malignant neoplasm of bone: Secondary | ICD-10-CM

## 2011-04-21 MED ORDER — MEGESTROL ACETATE 625 MG/5ML PO SUSP: 625.0000 mg | Freq: Every day | ORAL | Status: AC

## 2011-04-21 MED ORDER — OMEPRAZOLE 20 MG PO CPDR: 20.0000 mg | DELAYED_RELEASE_CAPSULE | Freq: Every day | ORAL | Status: DC

## 2011-04-21 MED ORDER — PROMETHAZINE HCL 25 MG PO TABS: ORAL_TABLET | ORAL | Status: DC

## 2011-04-21 NOTE — Telephone Encounter (Signed)
Per visit today pt requested per Dr Darnelle Catalan medication for nausea.  Per MD phenergan called in to [facility] pharmacy.

## 2011-04-21 NOTE — Progress Notes (Signed)
Pt here no c/o pain but loss appetite, 5 lb loss, took ortho vitals, sitting=98.8, 129/89,p=80,rr=20 Standing=138/88,p=85,rr=20, just nibbling on foods, encouraged supplements for high calories, no dificulty swallowing, encouraged 5-6 smaller meals also instead of 3 big meals 2:48 PM

## 2011-04-21 NOTE — Progress Notes (Signed)
Weekly Management Note Current Dose:15 Gy  Projected Dose: 30 Gy   Narrative:  The patient presents for routine under treatment assessment.  CBCT/MVCT images/Port film x-rays were reviewed.  The chart was checked. No appetite, heartburn relieved by tums. Nausea. No pain.   Physical Findings: Weight: 115 lb 8 oz (52.39 kg). Unchanged. No skin changes  Impression:  The patient is tolerating radiation.  Plan:  Continue treatment as planned. Script written for phenergan, megace and prilosec. Asked her to start with prilosec and go from there.

## 2011-04-22 ENCOUNTER — Telehealth: Payer: Self-pay | Admitting: Oncology

## 2011-04-22 ENCOUNTER — Ambulatory Visit
Admission: RE | Admit: 2011-04-22 | Discharge: 2011-04-22 | Disposition: A | Discharge status: Home / Self Care | Payer: Medicaid Other | Source: Ambulatory Visit | Attending: Radiation Oncology | Admitting: Radiation Oncology

## 2011-04-27 ENCOUNTER — Ambulatory Visit
Admission: RE | Admit: 2011-04-27 | Discharge: 2011-04-27 | Disposition: A | Discharge status: Home / Self Care | Payer: Medicaid Other | Source: Ambulatory Visit | Attending: Radiation Oncology | Admitting: Radiation Oncology

## 2011-04-27 ENCOUNTER — Telehealth: Payer: Self-pay

## 2011-04-27 NOTE — Telephone Encounter (Signed)
attempted to notify pt. twice but phone number incorrect.no other contact # available. Will inform to try prilosec  And ant-nausea med  On weekly md visit 04/28/11.

## 2011-04-28 ENCOUNTER — Telehealth: Payer: Self-pay | Admitting: *Deleted

## 2011-04-28 ENCOUNTER — Encounter: Payer: Self-pay | Admitting: Radiation Oncology

## 2011-04-28 ENCOUNTER — Other Ambulatory Visit: Payer: Self-pay | Admitting: Radiation Oncology

## 2011-04-28 ENCOUNTER — Ambulatory Visit: Payer: Medicaid Other

## 2011-04-28 ENCOUNTER — Ambulatory Visit
Admission: RE | Admit: 2011-04-28 | Discharge: 2011-04-28 | Disposition: A | Discharge status: Home / Self Care | Payer: Medicaid Other | Source: Ambulatory Visit | Attending: Radiation Oncology | Admitting: Radiation Oncology

## 2011-04-28 VITALS — BP 158/101 | HR 103 | Temp 97.0°F | Wt 109.9 lb

## 2011-04-28 DIAGNOSIS — C7951 Secondary malignant neoplasm of bone: Secondary | ICD-10-CM

## 2011-04-28 MED ORDER — SUCRALFATE 1 G PO TABS: ORAL_TABLET | ORAL | Status: DC

## 2011-04-28 MED ORDER — HYDROCODONE-ACETAMINOPHEN 7.5-500 MG/15ML PO SOLN: 15.0000 mL | Freq: Four times a day (QID) | ORAL | Status: AC | PRN

## 2011-04-28 MED ORDER — MAGIC MOUTHWASH W/LIDOCAINE: 5.0000 mL | Freq: Three times a day (TID) | ORAL | Status: DC | PRN

## 2011-04-28 NOTE — Progress Notes (Addendum)
Pt in prior to treatment with c/o pain in throat and upper esophageal region with difficulty swallowing her pain medication.  Oxycodone stuck in throat last pm and only after emesis was she able to expectorate the pill.  Presently grades pain as a 7-8 on a scale.  Burping frequently.  Not eating well.  Ate minimal last pm - Salad.  Unable to eat meat.  Has lost 6 lbs since 04/21/11.   VSS elevated.    Seen by Dr Michell Heinrich.  Pt will be given a script Lortab Elixir, Magic Mouthwash and Carafate today.  Will receive tx. Today.

## 2011-04-28 NOTE — Progress Notes (Signed)
Weekly Management Note Current Dose:21 Gy  Projected Dose:30 Gy   Narrative:  The patient presents for routine under treatment assessment.  CBCT/MVCT images/Port film x-rays were reviewed.  The chart was checked. More nausea, dysphagia, odynophagia. Just picked up Prilosec today. Not taking oxycodone because it comes right back up when she swallows.  Physical Findings:  Lying on exam room table.   Vitals:  Filed Vitals:   04/28/11 0950  BP: 158/101  Pulse: 103  Temp:    Weight:  Wt Readings from Last 3 Encounters:  04/28/11 109 lb 14.4 oz (49.85 kg)  04/21/11 115 lb 8 oz (52.39 kg)  04/13/11 120 lb 1.6 oz (54.477 kg)   Lab Results  Component Value Date   WBC 3.4* 04/13/2011   HGB 9.9* 04/13/2011   HCT 29.3* 04/13/2011   MCV 83.5 04/13/2011   PLT 446* 04/13/2011   Lab Results  Component Value Date   CREATININE 0.75 04/13/2011   BUN 9 04/13/2011   NA 138 04/13/2011   K 3.6 04/13/2011   CL 102 04/13/2011   CO2 27 04/13/2011     Impression:  The patient is tolerating radiation with dysphagia.  Plan:  Continue treatment as planned. Wrote scripts for carafate, mmw with lidocaine and lortab. Encouraged her to pick up today and increase po fluids.

## 2011-04-28 NOTE — Telephone Encounter (Signed)
xxx

## 2011-04-28 NOTE — Progress Notes (Signed)
Encounter addended by: Delynn Flavin, RN on: 04/28/2011 12:12 PM<BR>     Documentation filed: Notes Section, Visit Diagnoses

## 2011-04-29 ENCOUNTER — Ambulatory Visit: Payer: Medicaid Other

## 2011-04-29 ENCOUNTER — Telehealth: Payer: Self-pay | Admitting: Radiation Oncology

## 2011-04-29 NOTE — Telephone Encounter (Signed)
Made a note °

## 2011-04-30 ENCOUNTER — Ambulatory Visit
Admission: RE | Admit: 2011-04-30 | Discharge: 2011-04-30 | Disposition: A | Discharge status: Home / Self Care | Payer: Medicaid Other | Source: Ambulatory Visit | Attending: Radiation Oncology | Admitting: Radiation Oncology

## 2011-04-30 NOTE — Progress Notes (Signed)
Encounter addended by: Delynn Flavin, RN on: 04/30/2011 10:28 AM<BR>     Documentation filed: Charges VN

## 2011-04-30 NOTE — Progress Notes (Signed)
Encounter addended by: Delynn Flavin, RN on: 04/30/2011 10:26 AM<BR>     Documentation filed: Charges VN

## 2011-05-01 ENCOUNTER — Ambulatory Visit
Admission: RE | Admit: 2011-05-01 | Discharge: 2011-05-01 | Disposition: A | Discharge status: Home / Self Care | Payer: Medicaid Other | Source: Ambulatory Visit | Attending: Radiation Oncology | Admitting: Radiation Oncology

## 2011-05-10 NOTE — Progress Notes (Signed)
Complex simulation and treatment planning took place on 04/08/2011

## 2011-05-11 ENCOUNTER — Telehealth: Payer: Self-pay | Admitting: *Deleted

## 2011-05-11 ENCOUNTER — Other Ambulatory Visit (HOSPITAL_BASED_OUTPATIENT_CLINIC_OR_DEPARTMENT_OTHER): Payer: Medicaid Other | Admitting: Lab

## 2011-05-11 ENCOUNTER — Ambulatory Visit (HOSPITAL_BASED_OUTPATIENT_CLINIC_OR_DEPARTMENT_OTHER): Payer: Medicaid Other | Admitting: Physician Assistant

## 2011-05-11 ENCOUNTER — Other Ambulatory Visit: Payer: Self-pay | Admitting: Oncology

## 2011-05-11 ENCOUNTER — Encounter: Payer: Self-pay | Admitting: *Deleted

## 2011-05-11 ENCOUNTER — Ambulatory Visit: Payer: Medicaid Other

## 2011-05-11 ENCOUNTER — Encounter: Payer: Self-pay | Admitting: Physician Assistant

## 2011-05-11 ENCOUNTER — Ambulatory Visit: Payer: Self-pay

## 2011-05-11 DIAGNOSIS — C50519 Malignant neoplasm of lower-outer quadrant of unspecified female breast: Secondary | ICD-10-CM

## 2011-05-11 DIAGNOSIS — C787 Secondary malignant neoplasm of liver and intrahepatic bile duct: Secondary | ICD-10-CM

## 2011-05-11 DIAGNOSIS — C7951 Secondary malignant neoplasm of bone: Secondary | ICD-10-CM

## 2011-05-11 DIAGNOSIS — C7952 Secondary malignant neoplasm of bone marrow: Secondary | ICD-10-CM

## 2011-05-11 DIAGNOSIS — C50919 Malignant neoplasm of unspecified site of unspecified female breast: Secondary | ICD-10-CM

## 2011-05-11 DIAGNOSIS — I10 Essential (primary) hypertension: Secondary | ICD-10-CM

## 2011-05-11 LAB — CBC WITH DIFFERENTIAL/PLATELET
Basophils Absolute: 0 10*3/uL (ref 0.0–0.1)
Eosinophils Absolute: 0 10*3/uL (ref 0.0–0.5)
HCT: 34.4 % — ABNORMAL LOW (ref 34.8–46.6)
HGB: 11.6 g/dL (ref 11.6–15.9)
LYMPH%: 22.6 % (ref 14.0–49.7)
MCV: 79.6 fL (ref 79.5–101.0)
MONO#: 0.3 10*3/uL (ref 0.1–0.9)
MONO%: 9.2 % (ref 0.0–14.0)
NEUT#: 2 10*3/uL (ref 1.5–6.5)
Platelets: 296 10*3/uL (ref 145–400)
WBC: 3 10*3/uL — ABNORMAL LOW (ref 3.9–10.3)

## 2011-05-11 LAB — COMPREHENSIVE METABOLIC PANEL
Albumin: 3.6 g/dL (ref 3.5–5.2)
BUN: 12 mg/dL (ref 6–23)
CO2: 29 mEq/L (ref 19–32)
Calcium: 9.9 mg/dL (ref 8.4–10.5)
Glucose, Bld: 70 mg/dL (ref 70–99)
Potassium: 3.7 mEq/L (ref 3.5–5.3)
Sodium: 142 mEq/L (ref 135–145)
Total Protein: 7.4 g/dL (ref 6.0–8.3)

## 2011-05-11 LAB — CANCER ANTIGEN 27.29: CA 27.29: 36 U/mL (ref 0–39)

## 2011-05-11 MED ORDER — FULVESTRANT 250 MG/5ML IM SOLN
500.0000 mg | Freq: Once | INTRAMUSCULAR | Status: AC
  Administered 2011-05-11: 500 mg via INTRAMUSCULAR
  Filled 2011-05-11: qty 10

## 2011-05-11 MED ORDER — AMLODIPINE BESYLATE 5 MG PO TABS: 5.0000 mg | ORAL_TABLET | Freq: Every day | ORAL | Status: DC

## 2011-05-11 NOTE — Telephone Encounter (Signed)
placed patient on the walk-in list for labs printed out calendar and gave to the patient

## 2011-05-11 NOTE — Progress Notes (Signed)
Clinical Social Worker met with pt in office today. Pt requesting further assistance with transportation. CSW registered pt for [facility] transportation and gave pt number to set up future appointments.  Kathrin Penner, MSW, LCSWA Clinical Social Worker [facility] [phone]       [email or web address]

## 2011-05-11 NOTE — Progress Notes (Signed)
Hematology and Oncology Follow Up Visit  JAMEY DEMCHAK [id number] Mar 01, 1966 45 y.o. 05/11/2011    HPI: [name] is a [place] woman who is referred in January 2011 at the age of 70 for treatment of right breast carcinoma.  The patient delivered a child on 05/17/2009, after which she felt fullness in the right breast. She eventually brought this to her doctors attention and was scheduled for mammography and ultrasound on 03/08/2009. There were no prior exams for comparison. Mammography showed the breast to be dense, with a 5 cm obscured mass in the outer midportion of the right breast. Mass was easily palpable, and there was also a mass in the outer right periareolar region. They were suspicious microcalcifications in the outer midportion of the right breast, as well as within the mass itself. Ultrasound showed an extensive heterogeneous area, corresponding to the palpable abnormality with multiple solid and cystic foci. Overall, the mass measured in excess of 5 cm, in addition to cysts in the breast.  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 The tumors were ER +100%, PR +80%.  Bilateral breast MRI was obtained 03/22/2009. There was a large area of abnormal enhancement in the lower outer quadrant of the right breast measuring up to 9.7 cm. No abnormal appearing lymph nodes and no other masses in either breast.  Patient underwent definitive right mastectomy and sentinel lymph node sampling on 04/29/2009 under the care of Dr. Derrell Lolling.  Final pathology [id number]) confirmed high-grade invasive ductal carcinoma with some mucinous features, 9.5 cm with a micrometastatic deposit in one of 8 sampled lymph nodes. Repeat prognostic panel showed tumor to be ER +93% and PR +51%. HER-2/neu was negative with a ratio of 0.71, with proliferation marker of 36%.  Patient was evaluated by Dr. Darnelle Catalan in January 2011 but failed to  keep appointments until she was seen here again in July 2011. In July, she began adjuvant chemotherapy. She received 4 cycles of dose dense doxorubicin and cyclophosphamide followed by 4 weekly doses of paclitaxel which was discontinued in November 2011 secondary to peripheral neuropathy. The patient then received radiation therapy to the right chest wall which was truncated due to problems with missing appointments.  Patient was started on tamoxifen briefly in June of 2012 which was discontinued due to nausea. The nausea continued, and an abdominal CT in October 2012 confirmed a growth in liver lesions. These were biopsy on 03/10/2011 and showed metastatic adenocarcinoma, still ER positive at 100%, PR +5%. Patient was started on Faslodex on 03/16/2011, and began Zoladex the same date. Faslodex is given monthly, Zoladex on a every 3 month basis. Zoledronic acid was then started in November 2012.  The patient is also status post T9-T11 laminectomy and resection of paravertebral, vertebral, and epidural tumor was spinal cord decompression. Pathology from that procedure ([id number]) confirmed metastatic carcinoma. Patient is status post radiation, continuing on Faslodex, Zoladex, and Zometa.  Interim History:   Patient returns today for followup of her metastatic breast carcinoma, due for her next monthly dose of Faslodex (last given 04/13/2011), and her next monthly dose of zoledronic acid (also given last on 04/13/2011). She also receives Zoladex every 3 months, last given 2 months ago on 03/16/2011.  The patient's biggest complaint today is increased acid reflux. This worsened with radiation therapy, and Dr. Michell Heinrich started the patient on Carafate. She has been using the Carafate, although not regularly. She has a hard time swallowing the omeprazole, and  is taking no other acid reducers at this time. The acid reflux is bothersome, whether the patient eats or not, but is definitely worse following a meal.  She denies any actual nausea and has had no emesis. No change in bowel habits. No hemoptysis.  Patient has had a tooth in the left upper jaw that broke. This is causing her some mild discomfort, and she is looking into whether or not her Medicaid covers dentistry. She's had no additional jaw pain, and no recent dental procedures.  The good news is that the patient's pain has decreased significantly, and other than some tightness in the back, she denies any current pain. She has not taken pain medication at this time.  I will note that over her last several appointments here, the patient's blood pressure has been rising, 16 1/100 today. She denies any chest pain, pressure, or shortness of breath.  A detailed review of systems is otherwise noncontributory as noted below.  Review of Systems: Constitutional:  Decreased appetite with fatigue, no fevers or chills Eyes: negative WJX:BJYNWG upper left molar Cardiovascular: no chest pain or dyspnea on exertion Respiratory: no cough, shortness of breath, or wheezing Neurological: negative Dermatological: negative Gastrointestinal: positive for - appetite loss and heartburn negative for - blood in stools, change in bowel habits or nausea/vomiting Genito-Urinary: no dysuria, trouble voiding, or hematuria Hematological and Lymphatic: negative Breast: negative Musculoskeletal: negative Remaining ROS negative.  Medications: I have reviewed the patient's current medications. Current Outpatient Prescriptions  Medication Sig Dispense Refill  . sucralfate (CARAFATE) 1 G tablet Dissolve 1 tablet in 8 oz water and take po qid.  60 tablet  1  . Alum & Mag Hydroxide-Simeth (MAGIC MOUTHWASH W/LIDOCAINE) SOLN Take 5 mLs by mouth 3 (three) times daily as needed (take 15 minutes prior to meals.).  480 mL  0  . amLODipine (NORVASC) 5 MG tablet Take 1 tablet (5 mg total) by mouth daily.  30 tablet  1  . cyclobenzaprine (FLEXERIL) 10 MG tablet Take 10 mg by mouth  3 (three) times daily as needed.        . megestrol (MEGACE ES) 625 MG/5ML suspension Take 5 mLs (625 mg total) by mouth daily.  150 mL  0  . omeprazole (PRILOSEC) 20 MG capsule Take 1 capsule (20 mg total) by mouth daily.  30 capsule  1  . oxyCODONE-acetaminophen (PERCOCET) 5-325 MG per tablet Take 2 tablets by mouth every 6 (six) hours as needed for pain (max of 8 tabs a day ). 5/500mg  tabs, 1-2 tabs po q 6 hr prn pain.  Maximum of 8 tabs in 24 hrs.  120 tablet  0  . promethazine (PHENERGAN) 25 MG tablet 1/2 - 1 tab po q 6hrs prn nausea  30 tablet  1  . simethicone (MYLICON) 125 MG chewable tablet Chew 125 mg by mouth every 6 (six) hours as needed.         Current Facility-Administered Medications  Medication Dose Route Frequency Provider Last Rate Last Dose  . fulvestrant (FASLODEX) injection 500 mg  500 mg Intramuscular Once Zollie Scale, PA   500 mg at 05/11/11 1143    Allergies: No Known Allergies  Physical Exam: Filed Vitals:   05/11/11 1006  BP: 161/100  Pulse: 81  Temp: 98.2 F (36.8 C)   HEENT:  Sclerae anicteric, conjunctivae pink.  Oropharynx clear.  No mucositis or candidiasis.   Nodes:  No cervical, supraclavicular, or axillary lymphadenopathy palpated.  Breast Exam:  Deferred.  Lungs:  Clear to auscultation bilaterally.  No crackles, rhonchi, or wheezes.   Heart:  Regular rate and rhythm.   Abdomen:  Soft, mild tenderness to palpation in the upper quadrants.  Positive bowel sounds.  No organomegaly or masses palpated.   Musculoskeletal:  No focal spinal tenderness to gentle palpation.  Extremities:  Benign.  No peripheral edema or cyanosis.   Skin:  Benign.   Neuro:  Nonfocal.   Lab Results: Lab Results  Component Value Date   WBC 3.0* 05/11/2011   HGB 11.6 05/11/2011   HCT 34.4* 05/11/2011   MCV 79.6 05/11/2011   PLT 296 05/11/2011   NEUTROABS 2.0 05/11/2011     Chemistry      Component Value Date/Time   NA 138 04/13/2011 1030   K 3.6 04/13/2011 1030    CL 102 04/13/2011 1030   CO2 27 04/13/2011 1030   BUN 9 04/13/2011 1030   CREATININE 0.75 04/13/2011 1030      Component Value Date/Time   CALCIUM 9.7 04/13/2011 1030   ALKPHOS 185* 04/13/2011 1030   AST 22 04/13/2011 1030   ALT 16 04/13/2011 1030   BILITOT 0.2* 04/13/2011 1030        Radiological Studies:  No results found.   Assessment:  45 year old [place] woman who is   1. Status post right mastectomy and axillary lymph node dissection in November of 2010 for a T3 N1 MIC, grade 3, 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 Status post 4 cycles of dose dense doxorubicin and cyclophosphamide, followed by 4 weekly doses of paclitaxel given adjuvantly, been discontinued secondary to peripheral neuropathy.   2. Status post radiation to the right chest wall, truncated due to problems with the patient missing appointments.  3. Tamoxifen was started in June of 2012, but discontinued due to nausea.   4.  Now with pathologically documented metastatic disease to liver and bone, being treated with Faslodex since 03/16/2011, Zoladex every 3 months having also been started on 03/16/2011, and monthly zoledronic acid to be started today. Status post thoracic laminectomy, T9-T11, for impending cord compression on 03/21/2011.   5. Status post additional radiation to spine.     Plan:  The patient will receive her next injection of Faslodex today as planned. She is now due for Zoladex again until next month. We will however, hold her zoledronic acid today due to the recently broken tooth in the left upper jaw. The patient will obtain a dental appointment, and has been instructed to contact our office if she is not able to do so within the next couple of weeks so that we do not have to hold the zoledronic acid any longer than necessary.  With regards to the reflux, the patient will continue on the Carafate 4 times daily, using this consistently.  She will also start back on the omeprazole consistently, 20 mg by mouth twice a day. Per her report, she has plenty of this medication on hand at home.  Finally, I am starting a low dose of amlodipine 5 mg daily for increasing blood pressure, and we'll continue to follow this very closely. The patient is scheduled to return in 4 weeks, and we'll see Dr. Darnelle Catalan for brief followup at that time as well.  This plan was reviewed with the patient, who voices understanding and agreement.  She knows to call with any changes or problems.    Bayard More, PA-C 05/11/2011

## 2011-05-13 ENCOUNTER — Telehealth: Payer: Self-pay | Admitting: Oncology

## 2011-05-13 LAB — CELL SEARCH FOR BREAST CANCER

## 2011-05-13 NOTE — Progress Notes (Signed)
CC:   [name], M.D.  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  TREATMENT DATES:  04/15/2011 to 05/01/2011.  DOSE:  30 Gy at 3 Gy per fraction x10 fractions.  TREATMENT TOLERANCE:  [name] did fairly well.  It was difficult to tell, with her history, whether she was having dysphagia or odynophagia. Toward the end of her treatment, I think we finally got her Carafate, Magic Mouthwash and Percocet.  FOLLOW UP:  I plan on seeing her back in one month's time.  She also has regular scheduled follow up with Dr. Darnelle Catalan.    ______________________________ [name], M.D. SW/MEDQ  D:  05/11/2011  T:  05/11/2011  Job:  [id number]

## 2011-05-13 NOTE — Telephone Encounter (Signed)
Patient received one prescription from [facility] on 05/11/11 $3.00,her remaning balance [facility] $164.25 and ALIGHT $524.97

## 2011-05-29 ENCOUNTER — Other Ambulatory Visit: Payer: Self-pay | Admitting: *Deleted

## 2011-05-29 DIAGNOSIS — C50919 Malignant neoplasm of unspecified site of unspecified female breast: Secondary | ICD-10-CM

## 2011-05-29 MED ORDER — OXYCODONE-ACETAMINOPHEN 5-325 MG PO TABS: 2.0000 | ORAL_TABLET | Freq: Four times a day (QID) | ORAL | Status: DC | PRN

## 2011-05-30 ENCOUNTER — Telehealth: Payer: Self-pay | Admitting: Oncology

## 2011-05-30 NOTE — Telephone Encounter (Signed)
Added appts for feb/march/april ([facility]ai[facility]). Pt to be given new schedule @ 1/7 f/u.

## 2011-06-04 ENCOUNTER — Encounter: Payer: Self-pay | Admitting: Oncology

## 2011-06-04 NOTE — Progress Notes (Signed)
Patient received one prescription from [facility] on 06/03/11 $3.00,her remaning balance [facility] $161.25and ALIGHT 524.97.

## 2011-06-08 ENCOUNTER — Ambulatory Visit (HOSPITAL_BASED_OUTPATIENT_CLINIC_OR_DEPARTMENT_OTHER): Payer: Medicaid Other | Admitting: Oncology

## 2011-06-08 ENCOUNTER — Ambulatory Visit: Payer: Medicaid Other

## 2011-06-08 ENCOUNTER — Other Ambulatory Visit (HOSPITAL_BASED_OUTPATIENT_CLINIC_OR_DEPARTMENT_OTHER): Payer: Medicaid Other

## 2011-06-08 VITALS — BP 152/90 | HR 79 | Temp 98.3°F | Ht 62.0 in | Wt 115.0 lb

## 2011-06-08 DIAGNOSIS — C50519 Malignant neoplasm of lower-outer quadrant of unspecified female breast: Secondary | ICD-10-CM

## 2011-06-08 DIAGNOSIS — C50919 Malignant neoplasm of unspecified site of unspecified female breast: Secondary | ICD-10-CM

## 2011-06-08 DIAGNOSIS — C7952 Secondary malignant neoplasm of bone marrow: Secondary | ICD-10-CM

## 2011-06-08 DIAGNOSIS — Z5111 Encounter for antineoplastic chemotherapy: Secondary | ICD-10-CM

## 2011-06-08 DIAGNOSIS — C787 Secondary malignant neoplasm of liver and intrahepatic bile duct: Secondary | ICD-10-CM

## 2011-06-08 DIAGNOSIS — Z170 Estrogen receptor positive status [ER+]: Secondary | ICD-10-CM

## 2011-06-08 DIAGNOSIS — C7951 Secondary malignant neoplasm of bone: Secondary | ICD-10-CM

## 2011-06-08 LAB — COMPREHENSIVE METABOLIC PANEL
Alkaline Phosphatase: 112 U/L (ref 39–117)
BUN: 10 mg/dL (ref 6–23)
Glucose, Bld: 104 mg/dL — ABNORMAL HIGH (ref 70–99)
Total Bilirubin: 0.2 mg/dL — ABNORMAL LOW (ref 0.3–1.2)

## 2011-06-08 LAB — CBC WITH DIFFERENTIAL/PLATELET
BASO%: 0.1 % (ref 0.0–2.0)
Basophils Absolute: 0 10*3/uL (ref 0.0–0.1)
EOS%: 0.4 % (ref 0.0–7.0)
HGB: 11 g/dL — ABNORMAL LOW (ref 11.6–15.9)
MCH: 26.2 pg (ref 25.1–34.0)
MCHC: 33.9 g/dL (ref 31.5–36.0)
MCV: 77.4 fL — ABNORMAL LOW (ref 79.5–101.0)
MONO%: 8.1 % (ref 0.0–14.0)
RBC: 4.19 10*6/uL (ref 3.70–5.45)
RDW: 16.6 % — ABNORMAL HIGH (ref 11.2–14.5)

## 2011-06-08 MED ORDER — FULVESTRANT 250 MG/5ML IM SOLN
500.0000 mg | Freq: Once | INTRAMUSCULAR | Status: AC
Start: 2011-06-08 — End: 2011-06-08
  Administered 2011-06-08: 500 mg via INTRAMUSCULAR
  Filled 2011-06-08: qty 10

## 2011-06-08 MED ORDER — GOSERELIN ACETATE 10.8 MG ~~LOC~~ IMPL
10.8000 mg | DRUG_IMPLANT | Freq: Once | SUBCUTANEOUS | Status: AC
Start: 2011-06-08 — End: 2011-06-08
  Administered 2011-06-08: 10.8 mg via SUBCUTANEOUS
  Filled 2011-06-08: qty 10.8

## 2011-06-08 NOTE — Progress Notes (Signed)
Hematology and Oncology Follow Up Visit  [name] [id number] January 23, 1966 46 y.o. 06/08/2011    HPI: [name] is a [place] woman who is referred in January 2011 at the age of 15 for treatment of right breast carcinoma.  The patient delivered a child on 05/17/2009, after which she felt fullness in the right breast. She eventually brought this to her doctors attention and was scheduled for mammography and ultrasound on 03/08/2009. There were no prior exams for comparison. Mammography showed the breast to be dense, with a 5 cm obscured mass in the outer midportion of the right breast. Mass was easily palpable, and there was also a mass in the outer right periareolar region. They were suspicious microcalcifications in the outer midportion of the right breast, as well as within the mass itself. Ultrasound showed an extensive heterogeneous area, corresponding to the palpable abnormality with multiple solid and cystic foci. Overall, the mass measured in excess of 5 cm, in addition to cysts in the breast.  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 The tumors were ER +100%, PR +80%.  Bilateral breast MRI was obtained 03/22/2009. There was a large area of abnormal enhancement in the lower outer quadrant of the right breast measuring up to 9.7 cm. No abnormal appearing lymph nodes and no other masses in either breast.  Patient underwent definitive right mastectomy and sentinel lymph node sampling on 04/29/2009 under the care of Dr. Derrell Lolling.  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 Repeat prognostic panel showed tumor to be ER +93% and PR +51%. HER-2/neu was negative with a ratio of 0.71, with proliferation marker of 36%.  Patient was evaluated by Dr. Darnelle Catalan in January 2011 but failed to keep  appointments until she was seen here again in July 2011. In July, she began adjuvant chemotherapy. She received 4 cycles of dose dense doxorubicin and cyclophosphamide followed by 4 weekly doses of paclitaxel which was discontinued in November 2011 secondary to peripheral neuropathy. The patient then received radiation therapy to the right chest wall which was truncated due to problems with missing appointments.  Patient was started on tamoxifen briefly in June of 2012 which was discontinued due to nausea. The nausea continued, and an abdominal CT in October 2012 confirmed a growth in liver lesions. These were biopsy on 03/10/2011 and showed metastatic adenocarcinoma, still ER positive at 100%, PR +5%. Patient was started on Faslodex on 03/16/2011, and began Zoladex the same date. Faslodex is given monthly, Zoladex on a every 3 month basis. Zoledronic acid was then started in November 2012.  The patient is also status post T9-T11 laminectomy and resection of paravertebral, vertebral, and epidural tumor was spinal cord decompression. Pathology from that procedure ([id number]) confirmed metastatic carcinoma. Patient is status post radiation, continuing on Faslodex, Zoladex, and Zometa.  Interim History:    The patient is doing remarkably well with her treatments. She tells me the holidays were quite, she mostly stayed home with family. Her pain "comes and goes" but mostly it is a lot better than it was her her report. She has a little bit of numbness in her fingertips in both hands, not at all and toe tips. She is not having any swelling however. She feels a little tightness in the right chest wall area where she had her surgery and radiation she hasn't had a  period since she started the goserelin. She is having some hot flashes which she describes as mild. She wonders when the discoloration where she received radiation to the spine will resolve, and she understands this can take quite some time. Otherwise a  detailed review of systems was noncontributory as noted below  Review of Systems: Constitutional:  Mild fatigue, no fevers or chills Eyes: negative ZOX:WRUEAV upper left molar--will see dentist later this month Cardiovascular: no chest pain or dyspnea on exertion Respiratory: no cough, shortness of breath, or wheezing Neurological: negative Dermatological: Skin changes secondary to radiation, resulting Gastrointestinal: Heartburn is much improved negative for diarrhea, constipation, melena, or bright red blood per rectum. Genito-Urinary: no dysuria, trouble voiding, or hematuria Hematological and Lymphatic: negative Breast: negative Musculoskeletal: Back pain is stable Remaining ROS negative.   PAST MEDICAL HISTORY:  Generally unremarkable.  She has had two C-sections. History of tobacco abuse. She has poor dentition.  There are no chronic problems and she has had no prior surgeries except as noted.    FAMILY HISTORY:  The patient's father is alive at age 77.  The patient's mother is alive at age 46.  She has four sisters.  She had one brother who died from colon cancer at the age of 66.  There is no breast or ovarian cancer history in the family to her knowledge.  GYN HISTORY:  She is GX P2.  First pregnancy to term at age 18.  Last menstrual period Nov 2011.  She never took oral contraceptives.  SOCIAL HISTORY:  The patient is not employed.  She lives by herself with her baby whose name is Engineer, technical sales.  Her first child, [name], is 67 and is studying business in this area.  The patient has one grandchild.  She attends the [employer].    Medications: I have reviewed the patient's current medications. Current Outpatient Prescriptions  Medication Sig Dispense Refill  . Alum & Mag Hydroxide-Simeth (MAGIC MOUTHWASH W/LIDOCAINE) SOLN Take 5 mLs by mouth 3 (three) times daily as needed (take 15 minutes prior to meals.).  480 mL  0  . amLODipine (NORVASC) 5 MG tablet Take 1 tablet (5  mg total) by mouth daily.  30 tablet  1  . cyclobenzaprine (FLEXERIL) 10 MG tablet Take 10 mg by mouth 3 (three) times daily as needed.        [name] omeprazole (PRILOSEC) 20 MG capsule Take 1 capsule (20 mg total) by mouth daily.  30 capsule  1  . oxyCODONE-acetaminophen (PERCOCET) 5-325 MG per tablet Take 2 tablets by mouth every 6 (six) hours as needed for pain (max of 8 tabs a day ). 5/500mg  tabs, 1-2 tabs po q 6 hr prn pain.  Maximum of 8 tabs in 24 hrs.  120 tablet  0  . promethazine (PHENERGAN) 25 MG tablet 1/2 - 1 tab po q 6hrs prn nausea  30 tablet  1  . simethicone (MYLICON) 125 MG chewable tablet Chew 125 mg by mouth every 6 (six) hours as needed.        . sucralfate (CARAFATE) 1 G tablet Dissolve 1 tablet in 8 oz water and take po qid.  60 tablet  1    Allergies: No Known Allergies  Physical Exam: Filed Vitals:   06/08/11 1030  BP: 152/90  Pulse: 79  Temp: 98.3 F (36.8 C)   HEENT:  Sclerae anicteric, conjunctivae pink.  Oropharynx clear.  No mucositis or candidiasis.   Nodes:  No cervical, supraclavicular, or axillary  lymphadenopathy palpated.  Breast Exam:  Right breast status post mastectomy, no evidence of local recurrence; left breast no suspicious findings.   Lungs:  Clear to auscultation bilaterally.  No crackles, rhonchi, or wheezes.   Heart:  Regular rate and rhythm.   Abdomen:  Soft, nontender.[name]  Positive bowel sounds.  No organomegaly or masses palpated.   Musculoskeletal:  No focal spinal tenderness, no peripheral edema. She has good range of motion of with abduction of the right arm, although the skin is a little tight.  Extremities:  Benign.  No peripheral edema or cyanosis.   Skin:  Radiation changes, resolved  Neuro:  Nonfocal.   Lab Results: Lab Results  Component Value Date   WBC 2.5* 06/08/2011   HGB 11.0* 06/08/2011   HCT 32.4* 06/08/2011   MCV 77.4* 06/08/2011   PLT 288 06/08/2011   NEUTROABS 1.6 06/08/2011     Chemistry      Component Value Date/Time   NA  142 05/11/2011 0936   K 3.7 05/11/2011 0936   CL 105 05/11/2011 0936   CO2 29 05/11/2011 0936   BUN 12 05/11/2011 0936   CREATININE 0.84 05/11/2011 0936      Component Value Date/Time   CALCIUM 9.9 05/11/2011 0936   ALKPHOS 136* 05/11/2011 0936   AST 24 05/11/2011 0936   ALT 11 05/11/2011 0936   BILITOT 0.3 05/11/2011 0936        Radiological Studies:  No new results found. Most recent scans OCT 2011. Next Left mammogram due OCT at the breast center   Assessment:  46 year old [place] woman    1. Status post right mastectomy and axillary lymph node dissection November 2010 for a T3 N1(mic) Stage III invasive ductal carcinoma, grade 3, estrogen and progesterone receptor positive, HER-2/neu negative, with MIB-1 of 36%.   2.Status post adjuvant chemotherapy July to November 2011, consisting of 4 cycles of dose dense doxorubicin and cyclophosphamide, followed by 4 of 12 planned weekly doses of paclitaxel given adjuvantly, discontinued secondary to peripheral neuropathy.   3. Status post radiation to the right chest wall, truncated due to problems with the patient missing appointments.  4. Tamoxifen started June of 2012, but discontinued due to nausea.   5. metastatic disease to liver and bone pathologically documented October of 2012, estrogen receptor 100% positive, progesterone receptor 7% positive, with no HER-2 amplification;  treatment in the metastatic setting has consisted of:  6.Faslodex since 03/16/2011, Zoladex every 3 months also started on 03/16/2011, and monthly zoledronic acid started November 2011  7. 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 We're going to continue the monthly Faslodex and 4 Amada Kingfisher goes several line indefinitely, and will resume the monthly zoledronic acid possibly in November if she has been cleared by her dentist. She will see [place] on a monthly basis. She'll see me specifically in April and before that visit she will have restaging CT scans.    Akeia Perot C, PA-C 06/08/2011

## 2011-06-09 ENCOUNTER — Encounter: Payer: Self-pay | Admitting: Oncology

## 2011-06-09 NOTE — Progress Notes (Signed)
Patient received three prescription from [facility] on 06/05/11 $9.00,her remaning balance [facility] $152.25and [facility] $524.97

## 2011-06-10 ENCOUNTER — Telehealth: Payer: Self-pay | Admitting: Oncology

## 2011-06-10 NOTE — Telephone Encounter (Signed)
[facility] number is not in service will mail her feb-april2013 as well as hr ct scan appt for 08/25/2011 to her home

## 2011-06-10 NOTE — Telephone Encounter (Signed)
called [facility] s/w Britta Mccreedy and she stated that they are not accepting anything for this month

## 2011-06-30 ENCOUNTER — Encounter (HOSPITAL_COMMUNITY): Payer: Self-pay

## 2011-06-30 ENCOUNTER — Emergency Department (INDEPENDENT_AMBULATORY_CARE_PROVIDER_SITE_OTHER)
Admission: EM | Admit: 2011-06-30 | Discharge: 2011-06-30 | Disposition: A | Discharge status: Home / Self Care | Payer: Medicaid Other | Source: Home / Self Care | Attending: Emergency Medicine | Admitting: Emergency Medicine

## 2011-06-30 DIAGNOSIS — N390 Urinary tract infection, site not specified: Secondary | ICD-10-CM

## 2011-06-30 HISTORY — DX: Gastro-esophageal reflux disease without esophagitis: K21.9

## 2011-06-30 LAB — POCT URINALYSIS DIP (DEVICE)
Glucose, UA: NEGATIVE mg/dL
Nitrite: NEGATIVE
Protein, ur: 100 mg/dL — AB
Urobilinogen, UA: 1 mg/dL (ref 0.0–1.0)
pH: 6.5 (ref 5.0–8.0)

## 2011-06-30 MED ORDER — CEPHALEXIN 500 MG PO CAPS: 500.0000 mg | ORAL_CAPSULE | Freq: Three times a day (TID) | ORAL | Status: AC

## 2011-06-30 MED ORDER — PHENAZOPYRIDINE HCL 200 MG PO TABS: 200.0000 mg | ORAL_TABLET | Freq: Three times a day (TID) | ORAL | Status: AC | PRN

## 2011-06-30 NOTE — ED Provider Notes (Signed)
Chief Complaint  Patient presents with  . Dysuria    History of Present Illness:  The patient has had a one-week history of burning with urination, frequency, urgency, blood in the urine, and lower back pain. She denies fever, chills, nausea, vomiting, or lower abdominal pain. She has had no GYN complaints. Her last menstrual period was in September. She is being treated with chemotherapy for breast cancer. Her last UTI was about 2 months ago.  Review of Systems:  Other than noted above, the patient denies any of the following symptoms: General:  No fevers, chills, sweats, aches, or fatigue. GI:  No abdominal pain, back pain, nausea, vomiting, diarrhea, or constipation. GU:  No dysuria, frequency, urgency, hematuria, incontinence. GYN:  No itching, discharge, pelvic pain, or abnormal menses.  PMFSH:  Past medical history, family history, social history, meds, and allergies were reviewed.  Physical Exam:   Vital signs:  BP 149/92  Pulse 76  Temp(Src) 97.5 F (36.4 C) (Oral)  Resp 18  SpO2 99%  LMP 03/02/2011 Gen:  Alert, oriented, in no distress. Lungs:  Clear to auscultation, no wheezes, rales or rhonchi. Heart:  Regular rhythm, no gallop or murmer. Abdomen:  Flat and soft.  There was slight suprapubic tenderness to palpation without guarding or rebound.  No hepato-splenomegaly or mass.  Bowel sounds were normally active. Back:  No CVA tenderness to percussion. Skin:  Clear, warm and dry.  Labs:   Results for orders placed during the hospital encounter of 06/30/11  POCT URINALYSIS DIP (DEVICE)      Component Value Range   Glucose, UA NEGATIVE  NEGATIVE (mg/dL)   Bilirubin Urine NEGATIVE  NEGATIVE    Ketones, ur NEGATIVE  NEGATIVE (mg/dL)   Specific Gravity, Urine 1.015  1.005 - 1.030    Hgb urine dipstick SMALL (*) NEGATIVE    pH 6.5  5.0 - 8.0    Protein, ur 100 (*) NEGATIVE (mg/dL)   Urobilinogen, UA 1.0  0.0 - 1.0 (mg/dL)   Nitrite NEGATIVE  NEGATIVE    Leukocytes, UA  TRACE (*) NEGATIVE      Medications given in [facility]:  None  Assessment:  Diagnoses that have been ruled out:  None  Diagnoses that are still under consideration:  None  Final diagnoses:  UTI (lower urinary tract infection)     Plan:   1.  The following meds were prescribed:   New Prescriptions   CEPHALEXIN (KEFLEX) 500 MG CAPSULE    Take 1 capsule (500 mg total) by mouth 3 (three) times daily.   PHENAZOPYRIDINE (PYRIDIUM) 200 MG TABLET    Take 1 tablet (200 mg total) by mouth 3 (three) times daily as needed for pain.   2.  The patient was instructed in symptomatic care and handouts were given. 3.  The patient was told to return if becoming worse in any way, if no better in 3 or 4 days, and given some red flag symptoms that would indicate earlier return.    Roque Lias, MD 06/30/11 [phone]

## 2011-06-30 NOTE — ED Notes (Signed)
C/o burning with urination, urinary frequency and states she saw blood on tissue when she wiped, lower back pain.  Sx for 1 week.

## 2011-07-02 LAB — URINE CULTURE: Culture  Setup Time: 201301291621

## 2011-07-02 NOTE — ED Notes (Signed)
Urine culture: >100,000 colonies E. Coli.  Pt. adequately treated with Keflex. [name] 07/02/2011

## 2011-07-03 ENCOUNTER — Other Ambulatory Visit: Payer: Self-pay | Admitting: *Deleted

## 2011-07-03 DIAGNOSIS — C50919 Malignant neoplasm of unspecified site of unspecified female breast: Secondary | ICD-10-CM

## 2011-07-03 MED ORDER — OMEPRAZOLE 20 MG PO CPDR: 20.0000 mg | DELAYED_RELEASE_CAPSULE | Freq: Every day | ORAL | Status: DC

## 2011-07-03 NOTE — Telephone Encounter (Signed)
Pt. Called requesting refill on her heartburn med.  Prilosec refilled.

## 2011-07-04 ENCOUNTER — Other Ambulatory Visit: Payer: Self-pay | Admitting: Oncology

## 2011-07-06 ENCOUNTER — Ambulatory Visit: Payer: Medicaid Other | Admitting: Oncology

## 2011-07-06 ENCOUNTER — Other Ambulatory Visit: Payer: Medicaid Other | Admitting: Lab

## 2011-07-06 ENCOUNTER — Ambulatory Visit: Payer: Medicaid Other

## 2011-07-06 ENCOUNTER — Telehealth: Payer: Self-pay | Admitting: *Deleted

## 2011-07-06 ENCOUNTER — Encounter: Payer: Self-pay | Admitting: Oncology

## 2011-07-06 NOTE — Progress Notes (Signed)
Patient received one prescription from [facility] on 07/03/11 $3.00,her remaning balance [facility] $140.25and ALIGHT $524.97.

## 2011-07-06 NOTE — Telephone Encounter (Signed)
patient phone number has been disconnected will send patient an schedule in the mail to inform the patient of the new date and time

## 2011-07-15 ENCOUNTER — Ambulatory Visit (HOSPITAL_BASED_OUTPATIENT_CLINIC_OR_DEPARTMENT_OTHER): Payer: Medicaid Other | Admitting: Physician Assistant

## 2011-07-15 ENCOUNTER — Encounter: Payer: Self-pay | Admitting: Physician Assistant

## 2011-07-15 ENCOUNTER — Ambulatory Visit: Payer: Medicaid Other

## 2011-07-15 ENCOUNTER — Ambulatory Visit: Payer: Medicaid Other | Admitting: Lab

## 2011-07-15 ENCOUNTER — Telehealth: Payer: Self-pay | Admitting: Oncology

## 2011-07-15 ENCOUNTER — Other Ambulatory Visit (HOSPITAL_BASED_OUTPATIENT_CLINIC_OR_DEPARTMENT_OTHER): Payer: Medicaid Other | Admitting: Lab

## 2011-07-15 VITALS — BP 146/85 | HR 76 | Temp 98.4°F | Ht 62.0 in | Wt 111.2 lb

## 2011-07-15 DIAGNOSIS — C787 Secondary malignant neoplasm of liver and intrahepatic bile duct: Secondary | ICD-10-CM

## 2011-07-15 DIAGNOSIS — Z170 Estrogen receptor positive status [ER+]: Secondary | ICD-10-CM

## 2011-07-15 DIAGNOSIS — Z5111 Encounter for antineoplastic chemotherapy: Secondary | ICD-10-CM

## 2011-07-15 DIAGNOSIS — C50919 Malignant neoplasm of unspecified site of unspecified female breast: Secondary | ICD-10-CM

## 2011-07-15 DIAGNOSIS — I10 Essential (primary) hypertension: Secondary | ICD-10-CM

## 2011-07-15 DIAGNOSIS — C50519 Malignant neoplasm of lower-outer quadrant of unspecified female breast: Secondary | ICD-10-CM

## 2011-07-15 DIAGNOSIS — K219 Gastro-esophageal reflux disease without esophagitis: Secondary | ICD-10-CM

## 2011-07-15 DIAGNOSIS — C7951 Secondary malignant neoplasm of bone: Secondary | ICD-10-CM

## 2011-07-15 LAB — CBC WITH DIFFERENTIAL/PLATELET
BASO%: 0.1 % (ref 0.0–2.0)
EOS%: 0.4 % (ref 0.0–7.0)
LYMPH%: 12.9 % — ABNORMAL LOW (ref 14.0–49.7)
MCH: 24.8 pg — ABNORMAL LOW (ref 25.1–34.0)
MCHC: 32.7 g/dL (ref 31.5–36.0)
MCV: 76.1 fL — ABNORMAL LOW (ref 79.5–101.0)
MONO%: 3.4 % (ref 0.0–14.0)
NEUT#: 6.5 10*3/uL (ref 1.5–6.5)
Platelets: 282 10*3/uL (ref 145–400)
RBC: 4.51 10*6/uL (ref 3.70–5.45)
RDW: 17.8 % — ABNORMAL HIGH (ref 11.2–14.5)
nRBC: 0 % (ref 0–0)

## 2011-07-15 LAB — COMPREHENSIVE METABOLIC PANEL
ALT: 30 U/L (ref 0–35)
Alkaline Phosphatase: 117 U/L (ref 39–117)
Creatinine, Ser: 0.88 mg/dL (ref 0.50–1.10)
Sodium: 140 mEq/L (ref 135–145)
Total Bilirubin: 0.5 mg/dL (ref 0.3–1.2)
Total Protein: 7 g/dL (ref 6.0–8.3)

## 2011-07-15 MED ORDER — FULVESTRANT 250 MG/5ML IM SOLN
500.0000 mg | Freq: Once | INTRAMUSCULAR | Status: AC
  Administered 2011-07-15: 500 mg via INTRAMUSCULAR
  Filled 2011-07-15: qty 10

## 2011-07-15 MED ORDER — CYCLOBENZAPRINE HCL 10 MG PO TABS: 10.0000 mg | ORAL_TABLET | Freq: Three times a day (TID) | ORAL | Status: DC | PRN

## 2011-07-15 MED ORDER — OXYCODONE-ACETAMINOPHEN 5-325 MG PO TABS: 2.0000 | ORAL_TABLET | Freq: Three times a day (TID) | ORAL | Status: DC | PRN

## 2011-07-15 MED ORDER — OMEPRAZOLE 20 MG PO CPDR: 20.0000 mg | DELAYED_RELEASE_CAPSULE | Freq: Every day | ORAL | Status: DC

## 2011-07-15 MED ORDER — AMLODIPINE BESYLATE 5 MG PO TABS: 5.0000 mg | ORAL_TABLET | Freq: Every day | ORAL | Status: DC

## 2011-07-15 NOTE — Progress Notes (Signed)
Hematology and Oncology Follow Up Visit  [name] [id number] Dec 24, 1965 46 y.o. 07/15/2011    HPI: [name] is a [place] woman who is referred in January 2011 at the age of 34 for treatment of right breast carcinoma.  The patient delivered a child on 05/17/2009, after which she felt fullness in the right breast. She eventually brought this to her doctors attention and was scheduled for mammography and ultrasound on 03/08/2009. There were no prior exams for comparison. Mammography showed the breast to be dense, with a 5 cm obscured mass in the outer midportion of the right breast. Mass was easily palpable, and there was also a mass in the outer right periareolar region. They were suspicious microcalcifications in the outer midportion of the right breast, as well as within the mass itself. Ultrasound showed an extensive heterogeneous area, corresponding to the palpable abnormality with multiple solid and cystic foci. Overall, the mass measured in excess of 5 cm, in addition to cysts in the breast.  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 The tumors were ER +100%, PR +80%.  Bilateral breast MRI was obtained 03/22/2009. There was a large area of abnormal enhancement in the lower outer quadrant of the right breast measuring up to 9.7 cm. No abnormal appearing lymph nodes and no other masses in either breast.  Patient underwent definitive right mastectomy and sentinel lymph node sampling on 04/29/2009 under the care of Dr. Derrell Lolling.  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 Repeat prognostic panel showed tumor to be ER +93% and PR +51%. HER-2/neu was negative with a ratio of 0.71, with proliferation marker of 36%.  Patient was evaluated by Dr. Darnelle Catalan in January 2011 but failed to  keep appointments until she was seen here again in July 2011. In July, she began adjuvant chemotherapy. She received 4 cycles of dose dense doxorubicin and cyclophosphamide followed by 4 weekly doses of paclitaxel which was discontinued in November 2011 secondary to peripheral neuropathy. The patient then received radiation therapy to the right chest wall which was truncated due to problems with missing appointments.  Patient was started on tamoxifen briefly in June of 2012 which was discontinued due to nausea. The nausea continued, and an abdominal CT in October 2012 confirmed a growth in liver lesions. These were biopsy on 03/10/2011 and showed metastatic adenocarcinoma, still ER positive at 100%, PR +5%. Patient was started on Faslodex on 03/16/2011, and began Zoladex the same date. Faslodex is given monthly, Zoladex on a every 3 month basis. Zoledronic acid was then started in November 2012.  The patient is also status post T9-T11 laminectomy and resection of paravertebral, vertebral, and epidural tumor was spinal cord decompression. Pathology from that procedure ([id number]) confirmed metastatic carcinoma. Patient is status post radiation, continuing on Faslodex monthly and Zoladex every 3 months. Zometa held since November 2012 due to pending dental issues and possible extraction.  Interim History:   [name] returns today accompanied by her friend, Willie< and her daughter, [name].  While Shelva tells me she is "doing okay" she admits that her back pain is a little worse. It is intermittent,sometimes worse than others.  In the office today she would rate this a 3 on a scale of 1-10, but tells me that it is often worse. She is out of pain medication and has been taking ibuprofen appropriately.  Her pain is very well-controlled with the use of Flexeril and/or oxycodone/APAP.  She needs both of those medications refilled today.   [name] tolerates the Faslodex well. She has occasional hot flashes. No  increased joint pain. She is due for her next Faslodex today. Her most recent Zoladex was given in January, not due again until April. She's had no vaginal bleeding.  We continue to hold zoledronic acid. [name] has not yet been able to have a dental evaluation. She has a broken molar on the left, and this continues to cause her discomfort. The tooth is itself is broken down to the gum line. She denies any jaw pain.   Otherwise a detailed review of systems was noncontributory as noted below  Review of Systems: Constitutional:  Mild fatigue, hot flashes,  no fevers or chills Eyes: negative AOZ:HYQMVH left molar Cardiovascular: no chest pain or dyspnea on exertion Respiratory: no cough, shortness of breath, or wheezing Neurological: negative Dermatological: Skin changes secondary to radiation Gastrointestinal: negative for diarrhea, nausea, constipation, melena, or bright red blood per rectum. Genito-Urinary: no dysuria, trouble voiding, or hematuria Hematological and Lymphatic: negative Breast: negative Musculoskeletal: Back pain  Remaining ROS negative.   PAST MEDICAL HISTORY:  Generally unremarkable.  She has had two C-sections. History of tobacco abuse. She has poor dentition.  There are no chronic problems and she has had no prior surgeries except as noted.    FAMILY HISTORY:  The patient's father is alive at age 35.  The patient's mother is alive at age 43.  She has four sisters.  She had one brother who died from colon cancer at the age of 75.  There is no breast or ovarian cancer history in the family to her knowledge.  GYN HISTORY:  She is GX P2.  First pregnancy to term at age 35.  Last menstrual period Nov 2011.  She never took oral contraceptives.  SOCIAL HISTORY:  The patient is not employed.  She lives by herself with her baby whose name is [name], [name].  Her first child, [name], is 56 and is studying business in this area.  The patient has one grandchild.  She attends the [employer].    Medications: I have reviewed the patient's current medications. Current Outpatient Prescriptions  Medication Sig Dispense Refill  . amLODipine (NORVASC) 5 MG tablet Take 1 tablet (5 mg total) by mouth daily.  30 tablet  1  . cyclobenzaprine (FLEXERIL) 10 MG tablet Take 1 tablet (10 mg total) by mouth 3 (three) times daily as needed.  30 tablet  0  . omeprazole (PRILOSEC) 20 MG capsule Take 1 capsule (20 mg total) by mouth daily.  30 capsule  1  . oxyCODONE-acetaminophen (PERCOCET) 5-325 MG per tablet Take 2 tablets by mouth every 8 (eight) hours as needed for pain (max of 8 tabs a day ). 5/500mg  tabs, 1-2 tabs po q 6 hr prn pain.  Maximum of 8 tabs in 24 hrs.  120 tablet  0  . simethicone (MYLICON) 125 MG chewable tablet Chew 125 mg by mouth every 6 (six) hours as needed.        . Alum & Mag Hydroxide-Simeth (MAGIC MOUTHWASH W/LIDOCAINE) SOLN Take 5 mLs by mouth 3 (three) times daily as needed (take 15 minutes prior to meals.).  480 mL  0  . promethazine (PHENERGAN) 25 MG tablet 1/2 - 1 tab po q 6hrs prn nausea  30 tablet  1  . sucralfate (CARAFATE) 1 G tablet Dissolve  1 tablet in 8 oz water and take po qid.  60 tablet  1   Current Facility-Administered Medications  Medication Dose Route Frequency Provider Last Rate Last Dose  . fulvestrant (FASLODEX) injection 500 mg  500 mg Intramuscular Once Lowella Dell, MD   500 mg at 07/15/11 1113    Allergies: No Known Allergies  Physical Exam: Filed Vitals:   07/15/11 1028  BP: 146/85  Pulse: 76  Temp: 98.4 F (36.9 C)   HEENT:  Sclerae anicteric, conjunctivae pink.  Oropharynx clear.  No mucositis or candidiasis. Broken molar on the left jaw.  Nodes:  No cervical, supraclavicular, or axillary lymphadenopathy palpated.  Breast Exam:  Right breast status post mastectomy, no evidence of local recurrence; left breast benign, no masses, skin changes, or nipple inversion.  Lungs:  Clear to auscultation bilaterally.  No  crackles, rhonchi, or wheezes.   Heart:  Regular rate and rhythm.   Abdomen:  Soft, nontender.[name]  Positive bowel sounds.  No organomegaly or masses palpated.   Musculoskeletal: Focal spinal tenderness in the thoracic spine with gentle palpation. Otherwise no tenderness noted. She has good range of motion of with abduction of the right arm, although the skin is a little tight.  Extremities:  Benign.  No peripheral edema or cyanosis.   Skin:  Benign Neuro:  Nonfocal.   Lab Results: Lab Results  Component Value Date   WBC 7.8 07/15/2011   HGB 11.2* 07/15/2011   HCT 34.3* 07/15/2011   MCV 76.1* 07/15/2011   PLT 282 07/15/2011   NEUTROABS 6.5 07/15/2011     Chemistry      Component Value Date/Time   NA 140 06/08/2011 1013   K 3.3* 06/08/2011 1013   CL 104 06/08/2011 1013   CO2 25 06/08/2011 1013   BUN 10 06/08/2011 1013   CREATININE 0.79 06/08/2011 1013      Component Value Date/Time   CALCIUM 9.6 06/08/2011 1013   ALKPHOS 112 06/08/2011 1013   AST 22 06/08/2011 1013   ALT 12 06/08/2011 1013   BILITOT 0.2* 06/08/2011 1013        Radiological Studies:  Most recent scans OCT 2011.  Restaging CT scans are scheduled for March 26.  Next Left mammogram due OCT at the breast center   Assessment:  46 year old [place] woman    1. Status post right mastectomy and axillary lymph node dissection November 2010 for a T3 N1(mic) Stage III invasive ductal carcinoma, grade 3, estrogen and progesterone receptor positive, HER-2/neu negative, with MIB-1 of 36%.   2.Status post adjuvant chemotherapy July to November 2011, consisting of 4 cycles of dose dense doxorubicin and cyclophosphamide, followed by 4 of 12 planned weekly doses of paclitaxel given adjuvantly, discontinued secondary to peripheral neuropathy.   3. Status post radiation to the right chest wall, truncated due to problems with the patient missing appointments.  4. Tamoxifen started June of 2012, but discontinued due to nausea.   5. metastatic  disease to liver and bone pathologically documented October of 2012, estrogen receptor 100% positive, progesterone receptor 7% positive, with no HER-2 amplification;  treatment in the metastatic setting has consisted of:  6.Faslodex since 03/16/2011, Zoladex every 3 months also started on 03/16/2011, and monthly zoledronic acid started November 2011.  Zoledronic acid currently being held due to pending dental extraction.  7. Status post thoracic laminectomy, T9-T11, for impending cord compression on 03/21/2011, followed by irradiation to the spine completed November 2011   Plan:  This case was reviewed  with Dr. Darnelle Catalan. We will again try to schedule Diara to see Dr. Kristin Bruins for evaluation of his broken left molar. We really not comfortable with proceeding with zoledronic acid until this is taken care of, as it will likely need extracting.  In the meanwhile, Kaleyah will receive her Faslodex as scheduled today. I will see her again in 4 weeks for Faslodex, and we will see her in 8 weeks for both Faslodex and Zoladex.  I am refilling Akyah is Norvasc, omeprazole, Flexeril, and oxycodone/APAP today. She will continue on her current medication regimen. Overall, she appears to be doing quite well.   The patient voices understanding and agreement with our plan. She knows to call with any changes or problems.    Devlin Brink, PA-C 07/15/2011

## 2011-07-15 NOTE — Telephone Encounter (Signed)
ve the pt her march,april 2013 appt calendars

## 2011-07-17 ENCOUNTER — Encounter: Payer: Self-pay | Admitting: Oncology

## 2011-07-17 NOTE — Progress Notes (Signed)
Patient received three prescription from [facility] on 07/15/11 $9.00,her remaning balance [facility] $131.25and ALIGHT $533.58.

## 2011-07-22 ENCOUNTER — Telehealth: Payer: Self-pay | Admitting: *Deleted

## 2011-07-22 LAB — ESTRADIOL, ULTRA SENS: Estradiol, Ultra Sensitive: 11 pg/mL

## 2011-07-22 NOTE — Telephone Encounter (Signed)
This RN was called by Tyler Aas with Dr Kristin Bruins stating they have been unable to reach pt for dental consult x 1 week.  This RN called pt at given number and received automated VM. Message left for [name] to return call to this RN or to call Doris at the dental clinic/phone number given.

## 2011-07-27 NOTE — Progress Notes (Signed)
Pt did not show--rescheduled

## 2011-08-03 ENCOUNTER — Ambulatory Visit: Payer: Medicaid Other

## 2011-08-03 ENCOUNTER — Other Ambulatory Visit: Payer: Medicaid Other | Admitting: Lab

## 2011-08-03 ENCOUNTER — Ambulatory Visit: Payer: Medicaid Other | Admitting: Physician Assistant

## 2011-08-12 ENCOUNTER — Other Ambulatory Visit (HOSPITAL_BASED_OUTPATIENT_CLINIC_OR_DEPARTMENT_OTHER): Payer: Medicaid Other | Admitting: Lab

## 2011-08-12 ENCOUNTER — Ambulatory Visit: Payer: Medicaid Other

## 2011-08-12 ENCOUNTER — Ambulatory Visit (HOSPITAL_BASED_OUTPATIENT_CLINIC_OR_DEPARTMENT_OTHER): Payer: Medicaid Other | Admitting: Physician Assistant

## 2011-08-12 ENCOUNTER — Telehealth: Payer: Self-pay | Admitting: Oncology

## 2011-08-12 ENCOUNTER — Encounter: Payer: Self-pay | Admitting: Physician Assistant

## 2011-08-12 VITALS — BP 124/81 | HR 103 | Temp 98.8°F | Ht 62.0 in | Wt 111.3 lb

## 2011-08-12 DIAGNOSIS — C50519 Malignant neoplasm of lower-outer quadrant of unspecified female breast: Secondary | ICD-10-CM

## 2011-08-12 DIAGNOSIS — Z170 Estrogen receptor positive status [ER+]: Secondary | ICD-10-CM

## 2011-08-12 DIAGNOSIS — C7951 Secondary malignant neoplasm of bone: Secondary | ICD-10-CM

## 2011-08-12 DIAGNOSIS — C50919 Malignant neoplasm of unspecified site of unspecified female breast: Secondary | ICD-10-CM

## 2011-08-12 DIAGNOSIS — C787 Secondary malignant neoplasm of liver and intrahepatic bile duct: Secondary | ICD-10-CM

## 2011-08-12 DIAGNOSIS — Z5111 Encounter for antineoplastic chemotherapy: Secondary | ICD-10-CM

## 2011-08-12 DIAGNOSIS — C7952 Secondary malignant neoplasm of bone marrow: Secondary | ICD-10-CM

## 2011-08-12 LAB — COMPREHENSIVE METABOLIC PANEL
Albumin: 3.6 g/dL (ref 3.5–5.2)
BUN: 11 mg/dL (ref 6–23)
CO2: 28 mEq/L (ref 19–32)
Calcium: 9.6 mg/dL (ref 8.4–10.5)
Glucose, Bld: 131 mg/dL — ABNORMAL HIGH (ref 70–99)
Potassium: 3.4 mEq/L — ABNORMAL LOW (ref 3.5–5.3)
Sodium: 140 mEq/L (ref 135–145)
Total Protein: 7.4 g/dL (ref 6.0–8.3)

## 2011-08-12 LAB — CBC WITH DIFFERENTIAL/PLATELET
Basophils Absolute: 0 10*3/uL (ref 0.0–0.1)
EOS%: 0.7 % (ref 0.0–7.0)
Eosinophils Absolute: 0 10*3/uL (ref 0.0–0.5)
HCT: 37.6 % (ref 34.8–46.6)
HGB: 12.5 g/dL (ref 11.6–15.9)
MCH: 25.4 pg (ref 25.1–34.0)
MCV: 76.3 fL — ABNORMAL LOW (ref 79.5–101.0)
NEUT#: 1.6 10*3/uL (ref 1.5–6.5)
NEUT%: 54 % (ref 38.4–76.8)
RDW: 18.6 % — ABNORMAL HIGH (ref 11.2–14.5)
lymph#: 1 10*3/uL (ref 0.9–3.3)

## 2011-08-12 LAB — CANCER ANTIGEN 27.29: CA 27.29: 24 U/mL (ref 0–39)

## 2011-08-12 MED ORDER — SODIUM CHLORIDE 0.9 % IV SOLN: Freq: Once | INTRAVENOUS | Status: DC

## 2011-08-12 MED ORDER — CYCLOBENZAPRINE HCL 10 MG PO TABS: 10.0000 mg | ORAL_TABLET | Freq: Three times a day (TID) | ORAL | Status: DC | PRN

## 2011-08-12 MED ORDER — SODIUM CHLORIDE 0.9 % IJ SOLN
3.0000 mL | Freq: Once | INTRAMUSCULAR | Status: DC | PRN
  Filled 2011-08-12: qty 10

## 2011-08-12 MED ORDER — OXYCODONE-ACETAMINOPHEN 5-325 MG PO TABS: 2.0000 | ORAL_TABLET | Freq: Four times a day (QID) | ORAL | Status: DC | PRN

## 2011-08-12 MED ORDER — FULVESTRANT 250 MG/5ML IM SOLN
500.0000 mg | Freq: Once | INTRAMUSCULAR | Status: AC
  Administered 2011-08-12: 500 mg via INTRAMUSCULAR
  Filled 2011-08-12: qty 10

## 2011-08-12 NOTE — Progress Notes (Signed)
Hematology and Oncology Follow Up Visit  [name] [id number] March 19, 1966 46 y.o. 08/12/2011    HPI: [name] is a [place] woman who is referred in January 2011 at the age of 70 for treatment of right breast carcinoma.  The patient delivered a child on 05/17/2009, after which she felt fullness in the right breast. She eventually brought this to her doctors attention and was scheduled for mammography and ultrasound on 03/08/2009. There were no prior exams for comparison. Mammography showed the breast to be dense, with a 5 cm obscured mass in the outer midportion of the right breast. Mass was easily palpable, and there was also a mass in the outer right periareolar region. They were suspicious microcalcifications in the outer midportion of the right breast, as well as within the mass itself. Ultrasound showed an extensive heterogeneous area, corresponding to the palpable abnormality with multiple solid and cystic foci. Overall, the mass measured in excess of 5 cm, in addition to cysts in the breast.  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 The tumors were ER +100%, PR +80%.  Bilateral breast MRI was obtained 03/22/2009. There was a large area of abnormal enhancement in the lower outer quadrant of the right breast measuring up to 9.7 cm. No abnormal appearing lymph nodes and no other masses in either breast.  Patient underwent definitive right mastectomy and sentinel lymph node sampling on 04/29/2009 under the care of Dr. Derrell Lolling.  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 Repeat prognostic panel showed tumor to be ER +93% and PR +51%. HER-2/neu was negative with a ratio of 0.71, with proliferation marker of 36%.  Patient was evaluated by Dr. Darnelle Catalan in January 2011 but failed to  keep appointments until she was seen here again in July 2011. In July, she began adjuvant chemotherapy. She received 4 cycles of dose dense doxorubicin and cyclophosphamide followed by 4 weekly doses of paclitaxel which was discontinued in November 2011 secondary to peripheral neuropathy. The patient then received radiation therapy to the right chest wall which was truncated due to problems with missing appointments.  Patient was started on tamoxifen briefly in June of 2012 which was discontinued due to nausea. The nausea continued, and an abdominal CT in October 2012 confirmed a growth in liver lesions. These were biopsy on 03/10/2011 and showed metastatic adenocarcinoma, still ER positive at 100%, PR +5%. Patient was started on Faslodex on 03/16/2011, and began Zoladex the same date. Faslodex is given monthly, Zoladex on a every 3 month basis. Zoledronic acid was then started in November 2012.  The patient is also status post T9-T11 laminectomy and resection of paravertebral, vertebral, and epidural tumor was spinal cord decompression. Pathology from that procedure ([id number]) confirmed metastatic carcinoma. Patient is status post radiation, continuing on Faslodex, Zoladex, and Zometa.  Interim History:    [name] returns today in followup for her metastatic breast carcinoma. She is due for her next fulvestrant injections today, last given 213. She also receives Zoladex every 3 months, last given in January. Her zoledronic acid is currently being held secondary to dental issues.  Apparently Dr. Luretha Murphy office had tried to call to schedule an appointment for [facility] but she does not have a phone right now, and they were unable to get in touch with her. She is planning to contact our office later this afternoon  to schedule an appointment as soon as possible. For evaluation of a bottom right molar which is broken, and we'll likely need to be extracted.  REVIEW OF SYSTEMS: Adalyne has had a runny nose  this week with a cough, productive of clear phlegm. She's had some mild sinus congestion. She's had no fevers, chills, or night sweats, and denies any pleurisy or shortness of breath.  She continues to have chronic pain in the back. She takes Flexeril occasionally and also takes oxycodone/APAP 5/325 mg effectively. She tells me her pain varies, sometimes as high as an 8 on a scale of 1-10, but again is well controlled with her current pain regimen. She requests refills on both the Percocet and the Flexeril today.  [name] tolerates the injections well. She does have hot flashes. She's had no vaginal dryness, no vaginal bleeding. No increased joint pain.  A detailed review of systems is otherwise unremarkable.   PAST MEDICAL HISTORY:  Generally unremarkable.  She has had two C-sections. History of tobacco abuse. She has poor dentition.  There are no chronic problems and she has had no prior surgeries except as noted.    FAMILY HISTORY:  The patient's father is alive at age 69.  The patient's mother is alive at age 31.  She has four sisters.  She had one brother who died from colon cancer at the age of 44.  There is no breast or ovarian cancer history in the family to her knowledge.  GYN HISTORY:  She is GX P2.  First pregnancy to term at age 67.  Last menstrual period Nov 2011.  She never took oral contraceptives.  SOCIAL HISTORY:  The patient is not employed.  She has a 82-year-old son and an odor daughter. Her first child, [name] lives in this area.  The patient has one grandchild.  She attends the [employer].    Medications: I have reviewed the patient's current medications. Current Outpatient Prescriptions  Medication Sig Dispense Refill  . amLODipine (NORVASC) 5 MG tablet Take 1 tablet (5 mg total) by mouth daily.  30 tablet  1  . cyclobenzaprine (FLEXERIL) 10 MG tablet Take 1 tablet (10 mg total) by mouth 3 (three) times daily as needed for muscle spasms.  30 tablet  0  .  omeprazole (PRILOSEC) 20 MG capsule Take 1 capsule (20 mg total) by mouth daily.  30 capsule  1  . oxyCODONE-acetaminophen (PERCOCET) 5-325 MG per tablet Take 2 tablets by mouth every 6 (six) hours as needed for pain (max of 8 tabs a day ). 5/500mg  tabs, 1-2 tabs po q 6 hr prn pain.  Maximum of 8 tabs in 24 hrs.  120 tablet  0  . promethazine (PHENERGAN) 25 MG tablet 1/2 - 1 tab po q 6hrs prn nausea  30 tablet  1  . simethicone (MYLICON) 125 MG chewable tablet Chew 125 mg by mouth every 6 (six) hours as needed.        . sucralfate (CARAFATE) 1 G tablet Dissolve 1 tablet in 8 oz water and take po qid.  60 tablet  1   Current Facility-Administered Medications  Medication Dose Route Frequency Provider Last Rate Last Dose  . 0.9 %  sodium chloride infusion   Intravenous Once Catalina Gravel, PA      . fulvestrant (FASLODEX) injection 500 mg  500 mg Intramuscular Once Lowella Dell, MD   500 mg at 08/12/11 1349  . sodium chloride 0.9 % injection 3 mL  3 mL Intravenous Once PRN Catalina Gravel, PA        Allergies: No Known Allergies  Physical Exam: Filed Vitals:   08/12/11 1254  BP: 124/81  Pulse: 103  Temp: 98.8 F (37.1 C)   HEENT:  Sclerae anicteric, conjunctivae pink.  Oropharynx clear.  No mucositis or candidiasis.  There is a broken molar in the right lower jaw, broken down into the gum line. Obvious signs of erythema surrounding the tooth. Nodes:  No cervical, supraclavicular, or axillary lymphadenopathy palpated.  Breast Exam:  Right breast status post mastectomy, no evidence of local recurrence; left breast no suspicious findings.   Lungs:  Clear to auscultation bilaterally.  No crackles, rhonchi, or wheezes.   Heart:  Regular rate and rhythm.   Abdomen:  Soft, nontender.  Positive bowel sounds.  No organomegaly or masses palpated.   Musculoskeletal:  No focal spinal tenderness, no peripheral edema.  Extremities:  Benign.  No peripheral edema or cyanosis.   Skin: Benign Neuro:   Nonfocal.   Lab Results: Lab Results  Component Value Date   WBC 2.9* 08/12/2011   HGB 12.5 08/12/2011   HCT 37.6 08/12/2011   MCV 76.3* 08/12/2011   PLT 285 08/12/2011   NEUTROABS 1.6 08/12/2011     Chemistry      Component Value Date/Time   NA 140 08/12/2011 1241   K 3.4* 08/12/2011 1241   CL 104 08/12/2011 1241   CO2 28 08/12/2011 1241   BUN 11 08/12/2011 1241   CREATININE 0.81 08/12/2011 1241      Component Value Date/Time   CALCIUM 9.6 08/12/2011 1241   ALKPHOS 130* 08/12/2011 1241   AST 25 08/12/2011 1241   ALT 17 08/12/2011 1241   BILITOT 0.2* 08/12/2011 1241        Radiological Studies:  No new results found.   Assessment:  46 year old [place] woman    1. Status post right mastectomy and axillary lymph node dissection November 2010 for a T3 N1(mic) Stage III invasive ductal carcinoma, grade 3, estrogen and progesterone receptor positive, HER-2/neu negative, with MIB-1 of 36%.   2.Status post adjuvant chemotherapy July to November 2011, consisting of 4 cycles of dose dense doxorubicin and cyclophosphamide, followed by 4 of 12 planned weekly doses of paclitaxel given adjuvantly, discontinued secondary to peripheral neuropathy.   3. Status post radiation to the right chest wall, truncated due to problems with the patient missing appointments.  4. Tamoxifen started June of 2012, but discontinued due to nausea.   5. metastatic disease to liver and bone pathologically documented October of 2012, estrogen receptor 100% positive, progesterone receptor 7% positive, with no HER-2 amplification;  treatment in the metastatic setting has consisted of:  6.Faslodex since 03/16/2011, Zoladex every 3 months also started on 03/16/2011, and monthly zoledronic acid started November 2011  7. Status post thoracic laminectomy, T9-T11, for impending cord compression on 03/21/2011, followed by irradiation to the spine completed November 2011   Plan:  Dakisha seems to be doing well, and will  receive her next fulvestrant injections today as scheduled. As noted above, she'll schedule her appointment with Dr. Kristin Bruins as soon as possible, so that we might proceed with zoledronic acid.  Patient will return in one month to see Dr. Darnelle Catalan, and at that time will be due for both Zoladex and fulvestrant. Prior to that appointment, she scheduled for restaging CT scans on March 26. She will call with any problems prior to that appointment.    Shelene Krage, PA-C  08/12/2011  

## 2011-08-12 NOTE — Telephone Encounter (Signed)
gve the pt her April 2013 appt calendar along with the ct scan appt with instructions. 

## 2011-08-14 ENCOUNTER — Encounter: Payer: Self-pay | Admitting: Oncology

## 2011-08-14 NOTE — Progress Notes (Signed)
Patient received three prescriptions from [facility] on 08/12/11 $9.00,her remaninig balance [facility] $122.25and ALIGHT $533.58.

## 2011-08-25 ENCOUNTER — Inpatient Hospital Stay (HOSPITAL_COMMUNITY)
Admission: RE | Admit: 2011-08-25 | Discharge: 2011-08-25 | Payer: Medicaid Other | Source: Ambulatory Visit | Attending: Oncology | Admitting: Oncology

## 2011-08-28 ENCOUNTER — Other Ambulatory Visit: Payer: Self-pay | Admitting: *Deleted

## 2011-08-28 NOTE — Progress Notes (Signed)
Fax received from radiology noting pt was a no show for ct of chest abd and pelvis scheduled for 3/26.  Request forwarded to scheduling to reschedule scans prior to next MD appt in April as well as to document communication with pt.

## 2011-08-31 ENCOUNTER — Telehealth: Payer: Self-pay | Admitting: *Deleted

## 2011-08-31 ENCOUNTER — Ambulatory Visit: Payer: Medicaid Other

## 2011-08-31 ENCOUNTER — Encounter: Payer: Self-pay | Admitting: Oncology

## 2011-08-31 ENCOUNTER — Other Ambulatory Visit: Payer: Medicaid Other | Admitting: Lab

## 2011-08-31 NOTE — Progress Notes (Signed)
Patient received one prescription from [facility] pharmacy on 08/27/11 $3.00,her remaninig balance [facility] $119.25 and ALIGHT $533.58.

## 2011-08-31 NOTE — Telephone Encounter (Signed)
rescheduled patient ct cap for 09-03-2011 arrival time is 10:15am

## 2011-09-01 ENCOUNTER — Ambulatory Visit: Payer: Medicaid Other | Admitting: Physician Assistant

## 2011-09-01 ENCOUNTER — Ambulatory Visit: Payer: Medicaid Other

## 2011-09-01 ENCOUNTER — Other Ambulatory Visit: Payer: Medicaid Other | Admitting: Lab

## 2011-09-03 ENCOUNTER — Other Ambulatory Visit (HOSPITAL_COMMUNITY): Payer: Medicaid Other

## 2011-09-09 ENCOUNTER — Ambulatory Visit (HOSPITAL_BASED_OUTPATIENT_CLINIC_OR_DEPARTMENT_OTHER): Payer: Medicaid Other | Admitting: Oncology

## 2011-09-09 ENCOUNTER — Other Ambulatory Visit: Payer: Self-pay | Admitting: *Deleted

## 2011-09-09 ENCOUNTER — Ambulatory Visit: Payer: Medicaid Other

## 2011-09-09 ENCOUNTER — Other Ambulatory Visit (HOSPITAL_BASED_OUTPATIENT_CLINIC_OR_DEPARTMENT_OTHER): Payer: Medicaid Other | Admitting: Lab

## 2011-09-09 ENCOUNTER — Other Ambulatory Visit (HOSPITAL_BASED_OUTPATIENT_CLINIC_OR_DEPARTMENT_OTHER): Payer: Medicaid Other | Admitting: *Deleted

## 2011-09-09 VITALS — BP 153/88 | HR 84 | Temp 98.2°F | Ht 62.0 in | Wt 112.0 lb

## 2011-09-09 DIAGNOSIS — Z5111 Encounter for antineoplastic chemotherapy: Secondary | ICD-10-CM

## 2011-09-09 DIAGNOSIS — C7951 Secondary malignant neoplasm of bone: Secondary | ICD-10-CM

## 2011-09-09 DIAGNOSIS — C50919 Malignant neoplasm of unspecified site of unspecified female breast: Secondary | ICD-10-CM

## 2011-09-09 DIAGNOSIS — C787 Secondary malignant neoplasm of liver and intrahepatic bile duct: Secondary | ICD-10-CM

## 2011-09-09 DIAGNOSIS — C50419 Malignant neoplasm of upper-outer quadrant of unspecified female breast: Secondary | ICD-10-CM

## 2011-09-09 DIAGNOSIS — M79609 Pain in unspecified limb: Secondary | ICD-10-CM

## 2011-09-09 LAB — CBC WITH DIFFERENTIAL/PLATELET
BASO%: 0.6 % (ref 0.0–2.0)
Eosinophils Absolute: 0 10*3/uL (ref 0.0–0.5)
LYMPH%: 21.6 % (ref 14.0–49.7)
MCHC: 33.9 g/dL (ref 31.5–36.0)
MONO#: 0.4 10*3/uL (ref 0.1–0.9)
NEUT#: 3.6 10*3/uL (ref 1.5–6.5)
RBC: 4.34 10*6/uL (ref 3.70–5.45)
RDW: 20.7 % — ABNORMAL HIGH (ref 11.2–14.5)
WBC: 5.1 10*3/uL (ref 3.9–10.3)
lymph#: 1.1 10*3/uL (ref 0.9–3.3)
nRBC: 0 % (ref 0–0)

## 2011-09-09 LAB — COMPREHENSIVE METABOLIC PANEL
ALT: 14 U/L (ref 0–35)
AST: 23 U/L (ref 0–37)
Calcium: 9.4 mg/dL (ref 8.4–10.5)
Chloride: 103 mEq/L (ref 96–112)
Creatinine, Ser: 0.82 mg/dL (ref 0.50–1.10)
Potassium: 3.2 mEq/L — ABNORMAL LOW (ref 3.5–5.3)
Sodium: 139 mEq/L (ref 135–145)
Total Protein: 7.1 g/dL (ref 6.0–8.3)

## 2011-09-09 MED ORDER — OXYCODONE-ACETAMINOPHEN 5-325 MG PO TABS: 2.0000 | ORAL_TABLET | Freq: Four times a day (QID) | ORAL | Status: DC | PRN

## 2011-09-09 MED ORDER — DESLORATADINE-PSEUDOEPHED ER 5-240 MG PO TB24: 1.0000 | ORAL_TABLET | Freq: Every day | ORAL | Status: DC

## 2011-09-09 MED ORDER — FULVESTRANT 250 MG/5ML IM SOLN
500.0000 mg | Freq: Once | INTRAMUSCULAR | Status: AC
  Administered 2011-09-09: 500 mg via INTRAMUSCULAR
  Filled 2011-09-09: qty 10

## 2011-09-09 MED ORDER — GOSERELIN ACETATE 10.8 MG ~~LOC~~ IMPL
10.8000 mg | DRUG_IMPLANT | Freq: Once | SUBCUTANEOUS | Status: AC
  Administered 2011-09-09: 10.8 mg via SUBCUTANEOUS
  Filled 2011-09-09: qty 10.8

## 2011-09-09 NOTE — Progress Notes (Signed)
ID: [name]   DOB: [birth year]  MR#: [id number]  CSN#:[id number]  HISTORY OF PRESENT ILLNESS: The patient delivered a child on 05/17/2009, after which she felt fullness in the right breast. She eventually brought this to her doctors attention and was scheduled for mammography and ultrasound on 03/08/2009. There were no prior exams for comparison. Mammography showed the breast to be dense, with a 5 cm obscured mass in the outer midportion of the right breast. Mass was easily palpable, and there was also a mass in the outer right periareolar region. They were suspicious microcalcifications in the outer midportion of the right breast, as well as within the mass itself. Ultrasound showed an extensive heterogeneous area, corresponding to the palpable abnormality with multiple solid and cystic foci. Overall, the mass measured in excess of 5 cm, in addition to cysts in the breast.  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 The tumors were ER +100%, PR +80%.  Bilateral breast MRI was obtained 03/22/2009. There was a large area of abnormal enhancement in the lower outer quadrant of the right breast measuring up to 9.7 cm. No abnormal appearing lymph nodes and no other masses in either breast.  Patient underwent definitive right mastectomy and sentinel lymph node sampling on 04/29/2009 under the care of Dr. Derrell Lolling. 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 Repeat prognostic panel showed tumor to be ER +93% and PR +51%. HER-2/neu was negative with a ratio of 0.71, with proliferation marker of 36%.  Patient was evaluated by Dr. Darnelle Catalan in January 2011 but failed to keep appointments until she was seen here again in July 2011. In July, she began adjuvant chemotherapy. She received 4 cycles of dose dense  doxorubicin and cyclophosphamide followed by 4 weekly doses of paclitaxel which was discontinued in November 2011 secondary to peripheral neuropathy. The patient then received radiation therapy to the right chest wall which was truncated due to problems with missing appointments.  Patient was started on tamoxifen briefly in June of 2012 which was discontinued due to nausea. The nausea continued, and an abdominal CT in October 2012 confirmed a growth in liver lesions. These were biopsied on 03/10/2011 and showed metastatic adenocarcinoma, still ER positive at 100%, PR +5%. Her subsequent treatments are as detailed below  INTERVAL HISTORY: Damaya returns today with her daughter and her grandson for followup of Lekeya's breast cancer. She was supposed to have had some restaging studies but missed this. Overall she feels the interval history is unremarkable.  REVIEW OF SYSTEMS: She complains of pain in both her hands. Sometimes the pain goes up the forearm. She treats this by rubbing ever putting a little bit of hot water on them. She has had no unusual headaches visual changes nausea vomiting cough phlegm production pleurisy shortness of breath or any change in bowel or bladder habits, with good control of both. She does not have any lower extremity weakness or any sensory level of symptoms. A detailed review of systems was otherwise noncontributory  PAST MEDICAL HISTORY: Past Medical History  Diagnosis Date  . Hypertension   . Breast cancer   . 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 03/21/2011  . GERD (gastroesophageal reflux disease)     PAST SURGICAL HISTORY: Past Surgical History  Procedure Date  . Cesarean section   . Breast surgery     mastectomy  .  Tubal ligation   . Back surgery     FAMILY HISTORY The patient's father is alive at age 9.  The patient's mother is alive at age 52.  She has four sisters.  She had one brother who died from colon cancer at the age of 75.  There is no  breast or ovarian cancer history in the family to her knowledge.  GYNECOLOGIC HISTORY: She is GX P2.  First pregnancy to term at age 46.  Last menstrual period began January 9th.  Her periods usually are regular, once a month, last four days, of which two are heavy days. She never took oral contraceptives.  SOCIAL HISTORY: The patient is not employed.  She lives by herself with her baby whose name is Engineer, technical sales.  Her first child, [name], is 35 and is studying business in this area.  The patient has one grandchild.  She attends the [employer].    ADVANCED DIRECTIVES:  HEALTH MAINTENANCE: History  Substance Use Topics  . Smoking status: Current Everyday Smoker -- 0.2 packs/day  . Smokeless tobacco: Never Used  . Alcohol Use: No     Colonoscopy:  PAP:  Bone density:  Lipid panel:  No Known Allergies  Current Outpatient Prescriptions  Medication Sig Dispense Refill  . amLODipine (NORVASC) 5 MG tablet Take 1 tablet (5 mg total) by mouth daily.  30 tablet  1  . cyclobenzaprine (FLEXERIL) 10 MG tablet Take 1 tablet (10 mg total) by mouth 3 (three) times daily as needed for muscle spasms.  30 tablet  0  . omeprazole (PRILOSEC) 20 MG capsule Take 1 capsule (20 mg total) by mouth daily.  30 capsule  1  . oxyCODONE-acetaminophen (PERCOCET) 5-325 MG per tablet Take 2 tablets by mouth every 6 (six) hours as needed for pain (max of 8 tabs a day ). 5/500mg  tabs, 1-2 tabs po q 6 hr prn pain.  Maximum of 8 tabs in 24 hrs.  120 tablet  0  . promethazine (PHENERGAN) 25 MG tablet 1/2 - 1 tab po q 6hrs prn nausea  30 tablet  1  . simethicone (MYLICON) 125 MG chewable tablet Chew 125 mg by mouth every 6 (six) hours as needed.        . sucralfate (CARAFATE) 1 G tablet Dissolve 1 tablet in 8 oz water and take po qid.  60 tablet  1  . DISCONTD: Alum & Mag Hydroxide-Simeth (MAGIC MOUTHWASH W/LIDOCAINE) SOLN Take 5 mLs by mouth 3 (three) times daily as needed (take 15 minutes prior to meals.).  480 mL   0    OBJECTIVE: Middle-aged [place] American woman who appears comfortable Filed Vitals:   09/09/11 1019  BP: 153/88  Pulse: 84  Temp: 98.2 F (36.8 C)     Body mass index is 20.49 kg/(m^2).    ECOG FS:1 Sclerae unicteric Oropharynx clear No peripheral adenopathy Lungs no rales or rhonchi Heart regular rate and rhythm Abd benign MSK no focal spinal tenderness, no peripheral edema; no obvious changes over the hands to suggest the reason for tenderness there, specifically no joint swelling or erythema Neuro: nonfocal Breasts: The right breast is status post mastectomy with no evidence of local recurrence. Left breast is unremarkable  LAB RESULTS: Lab Results  Component Value Date   WBC 5.1 09/09/2011   NEUTROABS 3.6 09/09/2011   HGB 11.9 09/09/2011   HCT 35.2 09/09/2011   MCV 81.1 09/09/2011   PLT 283 09/09/2011      Chemistry  Component Value Date/Time   NA 139 09/09/2011 0959   K 3.2* 09/09/2011 0959   CL 103 09/09/2011 0959   CO2 26 09/09/2011 0959   BUN 13 09/09/2011 0959   CREATININE 0.82 09/09/2011 0959      Component Value Date/Time   CALCIUM 9.4 09/09/2011 0959   ALKPHOS 127* 09/09/2011 0959   AST 23 09/09/2011 0959   ALT 14 09/09/2011 0959   BILITOT 0.2* 09/09/2011 0959       Lab Results  Component Value Date   LABCA2 24 08/12/2011    No components found with this basename: [id number]    No results found for this basename: INR:1;PROTIME:1 in the last 168 hours  Urinalysis    Component Value Date/Time   COLORURINE YELLOW 03/25/2011 0807   APPEARANCEUR CLOUDY* 03/25/2011 0807   LABSPEC 1.015 06/30/2011 1040   LABSPEC 1.020 03/30/2011 0858   PHURINE 6.5 06/30/2011 1040   GLUCOSEU NEGATIVE 06/30/2011 1040   HGBUR SMALL* 06/30/2011 1040   BILIRUBINUR NEGATIVE 06/30/2011 1040   KETONESUR NEGATIVE 06/30/2011 1040   PROTEINUR 100* 06/30/2011 1040   UROBILINOGEN 1.0 06/30/2011 1040   NITRITE NEGATIVE 06/30/2011 1040   LEUKOCYTESUR TRACE* 06/30/2011 1040     STUDIES: No results found.  ASSESSMENT:   46 year old [place] woman     1. Status post right mastectomy and axillary lymph node dissection November 2010 for a T3 N1(mic) Stage III invasive ductal carcinoma, grade 3, estrogen and progesterone receptor positive, HER-2/neu negative, with MIB-1 of 36%.    2.Status post adjuvant chemotherapy July to November 2011, consisting of 4 cycles of dose dense doxorubicin and cyclophosphamide, followed by 4 of 12 planned weekly doses of paclitaxel given adjuvantly, discontinued secondary to peripheral neuropathy.    3. Status post radiation to the right chest wall, truncated due to problems with the patient missing appointments.   4. Tamoxifen started June of 2012, but discontinued due to nausea.    5. metastatic disease to liver and bone pathologically documented October of 2012, estrogen receptor 100% positive, progesterone receptor 7% positive, with no HER-2 amplification;  treatment in the metastatic setting has consisted of:   6.Faslodex since 03/16/2011, Zoladex every 3 months also started on 03/16/2011, and monthly zoledronic acid started November 2011   7. Status post thoracic laminectomy, T9-T11, for impending cord compression on 03/21/2011, followed by irradiation to the spine completed November 2011    PLAN: I am going to go ahead and rescheduled her restaging studies, specifically a liver MRI; and I will see if Dr. Nelly Rout is willing to see her even though she has missed appointments with him as well. On the plus side, Rosey Bath is coming here monthly to get her shots and clinically they seem to be working. She will see [place] again in a month. She knows to call for any problems that may develop before that visit.   Shanavia Makela C    09/09/2011

## 2011-09-09 NOTE — Telephone Encounter (Signed)
Attempted to call the pt regarding her faslodex injection per month unable to get through. Phone number is disconnected. lmonvm of doris from dr [employer] office regarding the dental referral.

## 2011-09-10 ENCOUNTER — Encounter: Payer: Self-pay | Admitting: Oncology

## 2011-09-10 LAB — CANCER ANTIGEN 27.29: CA 27.29: 21 U/mL (ref 0–39)

## 2011-09-10 NOTE — Progress Notes (Signed)
Patient received two prescriptions from [facility] on 09/09/11 $6.00,her remaninig balance [facility] $113.25 and [facility] $533.58.

## 2011-09-18 ENCOUNTER — Telehealth: Payer: Self-pay | Admitting: Oncology

## 2011-09-18 NOTE — Telephone Encounter (Signed)
gve the pt her mri appt for may along with the number for her to call dr Luretha Murphy office for an appt. Pt's phone number has been disconnected

## 2011-09-21 ENCOUNTER — Telehealth (HOSPITAL_COMMUNITY): Payer: Self-pay | Admitting: Dental General Practice

## 2011-09-24 ENCOUNTER — Inpatient Hospital Stay (HOSPITAL_COMMUNITY): Admission: RE | Admit: 2011-09-24 | Payer: Medicaid Other | Source: Ambulatory Visit

## 2011-09-28 ENCOUNTER — Other Ambulatory Visit (HOSPITAL_COMMUNITY): Payer: Medicaid Other | Admitting: Dentistry

## 2011-10-01 ENCOUNTER — Encounter (HOSPITAL_COMMUNITY): Payer: Self-pay | Admitting: *Deleted

## 2011-10-01 ENCOUNTER — Inpatient Hospital Stay (HOSPITAL_COMMUNITY)
Admission: EM | Admit: 2011-10-01 | Discharge: 2011-10-03 | DRG: 690 | Disposition: A | Discharge status: Home / Self Care | Payer: Medicaid Other | Attending: Internal Medicine | Admitting: Internal Medicine

## 2011-10-01 ENCOUNTER — Inpatient Hospital Stay (HOSPITAL_COMMUNITY): Payer: Medicaid Other

## 2011-10-01 ENCOUNTER — Emergency Department (HOSPITAL_COMMUNITY): Payer: Medicaid Other

## 2011-10-01 DIAGNOSIS — C50919 Malignant neoplasm of unspecified site of unspecified female breast: Secondary | ICD-10-CM

## 2011-10-01 DIAGNOSIS — E876 Hypokalemia: Secondary | ICD-10-CM

## 2011-10-01 DIAGNOSIS — N12 Tubulo-interstitial nephritis, not specified as acute or chronic: Principal | ICD-10-CM

## 2011-10-01 DIAGNOSIS — C787 Secondary malignant neoplasm of liver and intrahepatic bile duct: Secondary | ICD-10-CM | POA: Diagnosis present

## 2011-10-01 DIAGNOSIS — R5381 Other malaise: Secondary | ICD-10-CM

## 2011-10-01 DIAGNOSIS — I10 Essential (primary) hypertension: Secondary | ICD-10-CM | POA: Diagnosis present

## 2011-10-01 DIAGNOSIS — N390 Urinary tract infection, site not specified: Secondary | ICD-10-CM

## 2011-10-01 HISTORY — DX: Urinary tract infection, site not specified: N39.0

## 2011-10-01 HISTORY — DX: Secondary malignant neoplasm of bone: C79.51

## 2011-10-01 HISTORY — DX: Tubulo-interstitial nephritis, not specified as acute or chronic: N12

## 2011-10-01 LAB — DIFFERENTIAL
Basophils Relative: 0 % (ref 0–1)
Eosinophils Absolute: 0 10*3/uL (ref 0.0–0.7)
Eosinophils Relative: 0 % (ref 0–5)
Lymphocytes Relative: 5 % — ABNORMAL LOW (ref 12–46)
Neutro Abs: 11.9 10*3/uL — ABNORMAL HIGH (ref 1.7–7.7)

## 2011-10-01 LAB — COMPREHENSIVE METABOLIC PANEL
ALT: 35 U/L (ref 0–35)
Alkaline Phosphatase: 142 U/L — ABNORMAL HIGH (ref 39–117)
BUN: 13 mg/dL (ref 6–23)
CO2: 22 mEq/L (ref 19–32)
Chloride: 100 mEq/L (ref 96–112)
GFR calc Af Amer: 81 mL/min — ABNORMAL LOW (ref >90)
GFR calc non Af Amer: 70 mL/min — ABNORMAL LOW (ref >90)
Glucose, Bld: 155 mg/dL — ABNORMAL HIGH (ref 70–99)
Potassium: 2.9 mEq/L — ABNORMAL LOW (ref 3.5–5.1)
Total Bilirubin: 0.4 mg/dL (ref 0.3–1.2)
Total Protein: 6.8 g/dL (ref 6.0–8.3)

## 2011-10-01 LAB — CBC
HCT: 36.4 % (ref 36.0–46.0)
Hemoglobin: 12.3 g/dL (ref 12.0–15.0)
MCHC: 33.8 g/dL (ref 30.0–36.0)
RBC: 4.5 MIL/uL (ref 3.87–5.11)

## 2011-10-01 LAB — URINALYSIS, ROUTINE W REFLEX MICROSCOPIC
Bilirubin Urine: NEGATIVE
Ketones, ur: NEGATIVE mg/dL
Nitrite: NEGATIVE
Protein, ur: 30 mg/dL — AB
pH: 6.5 (ref 5.0–8.0)

## 2011-10-01 LAB — URINE MICROSCOPIC-ADD ON

## 2011-10-01 LAB — MAGNESIUM: Magnesium: 1.9 mg/dL (ref 1.5–2.5)

## 2011-10-01 MED ORDER — ONDANSETRON HCL 4 MG/2ML IJ SOLN: 4.0000 mg | Freq: Four times a day (QID) | INTRAMUSCULAR | Status: DC | PRN

## 2011-10-01 MED ORDER — DEXTROSE 5 % IV SOLN
1.0000 g | Freq: Once | INTRAVENOUS | Status: AC
  Administered 2011-10-01: 1 g via INTRAVENOUS
  Filled 2011-10-01: qty 10

## 2011-10-01 MED ORDER — ACETAMINOPHEN 325 MG PO TABS
650.0000 mg | ORAL_TABLET | Freq: Four times a day (QID) | ORAL | Status: DC | PRN
  Administered 2011-10-02: 650 mg via ORAL
  Filled 2011-10-01: qty 2

## 2011-10-01 MED ORDER — ONDANSETRON HCL 4 MG PO TABS: 4.0000 mg | ORAL_TABLET | Freq: Four times a day (QID) | ORAL | Status: DC | PRN

## 2011-10-01 MED ORDER — ONDANSETRON HCL 4 MG/2ML IJ SOLN
4.0000 mg | Freq: Once | INTRAMUSCULAR | Status: AC
  Administered 2011-10-01: 4 mg via INTRAVENOUS
  Filled 2011-10-01: qty 2

## 2011-10-01 MED ORDER — POTASSIUM CHLORIDE IN NACL 20-0.9 MEQ/L-% IV SOLN
INTRAVENOUS | Status: DC
  Administered 2011-10-01 – 2011-10-03 (×4): via INTRAVENOUS
  Filled 2011-10-01 (×8): qty 1000

## 2011-10-01 MED ORDER — SUCRALFATE 1 G PO TABS
1.0000 g | ORAL_TABLET | Freq: Three times a day (TID) | ORAL | Status: DC
  Administered 2011-10-02 – 2011-10-03 (×5): 1 g via ORAL
  Filled 2011-10-01 (×11): qty 1

## 2011-10-01 MED ORDER — POTASSIUM CHLORIDE CRYS ER 20 MEQ PO TBCR
40.0000 meq | EXTENDED_RELEASE_TABLET | Freq: Once | ORAL | Status: AC
  Administered 2011-10-01: 40 meq via ORAL
  Filled 2011-10-01: qty 2

## 2011-10-01 MED ORDER — SODIUM CHLORIDE 0.9 % IV BOLUS (SEPSIS)
1000.0000 mL | Freq: Once | INTRAVENOUS | Status: AC
  Administered 2011-10-01: 1000 mL via INTRAVENOUS

## 2011-10-01 MED ORDER — OXYCODONE-ACETAMINOPHEN 5-325 MG PO TABS: 1.0000 | ORAL_TABLET | ORAL | Status: DC | PRN

## 2011-10-01 MED ORDER — HYDROMORPHONE HCL PF 1 MG/ML IJ SOLN
1.0000 mg | INTRAMUSCULAR | Status: DC | PRN
  Administered 2011-10-02 – 2011-10-03 (×6): 1 mg via INTRAVENOUS
  Filled 2011-10-01 (×7): qty 1

## 2011-10-01 MED ORDER — ACETAMINOPHEN 650 MG RE SUPP: 650.0000 mg | Freq: Four times a day (QID) | RECTAL | Status: DC | PRN

## 2011-10-01 MED ORDER — POTASSIUM CHLORIDE 10 MEQ/100ML IV SOLN
10.0000 meq | INTRAVENOUS | Status: AC
  Administered 2011-10-01: 10 meq via INTRAVENOUS
  Filled 2011-10-01: qty 100

## 2011-10-01 MED ORDER — PANTOPRAZOLE SODIUM 40 MG PO TBEC
40.0000 mg | DELAYED_RELEASE_TABLET | Freq: Every day | ORAL | Status: DC
  Administered 2011-10-02: 40 mg via ORAL
  Filled 2011-10-01: qty 1

## 2011-10-01 MED ORDER — POTASSIUM CHLORIDE 10 MEQ/100ML IV SOLN
10.0000 meq | Freq: Once | INTRAVENOUS | Status: AC
  Administered 2011-10-01: 10 meq via INTRAVENOUS
  Filled 2011-10-01: qty 100

## 2011-10-01 MED ORDER — SODIUM CHLORIDE 0.9 % IJ SOLN
3.0000 mL | Freq: Two times a day (BID) | INTRAMUSCULAR | Status: DC
  Administered 2011-10-02 (×2): 3 mL via INTRAVENOUS

## 2011-10-01 MED ORDER — PIPERACILLIN-TAZOBACTAM 3.375 G IVPB
3.3750 g | Freq: Three times a day (TID) | INTRAVENOUS | Status: DC
  Administered 2011-10-01 – 2011-10-03 (×5): 3.375 g via INTRAVENOUS
  Filled 2011-10-01 (×7): qty 50

## 2011-10-01 MED ORDER — AMLODIPINE BESYLATE 5 MG PO TABS
5.0000 mg | ORAL_TABLET | Freq: Every day | ORAL | Status: DC
  Administered 2011-10-02 – 2011-10-03 (×2): 5 mg via ORAL
  Filled 2011-10-01 (×2): qty 1

## 2011-10-01 MED ORDER — PROMETHAZINE HCL 25 MG PO TABS: 12.5000 mg | ORAL_TABLET | Freq: Four times a day (QID) | ORAL | Status: DC | PRN

## 2011-10-01 MED ORDER — CYCLOBENZAPRINE HCL 10 MG PO TABS: 10.0000 mg | ORAL_TABLET | Freq: Three times a day (TID) | ORAL | Status: DC | PRN

## 2011-10-01 MED ORDER — ACETAMINOPHEN 325 MG PO TABS
650.0000 mg | ORAL_TABLET | Freq: Once | ORAL | Status: AC
  Administered 2011-10-01: 650 mg via ORAL
  Filled 2011-10-01: qty 2

## 2011-10-01 NOTE — ED Notes (Signed)
Pt is here with 3 day hx of lower abdominal pain as well as pain and burning with urination.  Pt also describes malodorous urine.  No other GYN symptoms.  Pt has had UTI in the past and states that this feels just like it

## 2011-10-01 NOTE — H&P (Signed)
[name] is an 46 y.o. female.   Oncologist - Dr.Magrinath. Chief Complaint: Left flank pain. HPI: 46 year-old female with history of metastatic breast cancer stage III on chemotherapy, history of hypertension and history of impending cord compression status post thoracic laminectomy and radiation in October 2012 presented with complaints of worsening left flank pain over the last 3 days. In addition patient also had fever chills and dysuria and denies any vaginal discharge. In the ER patient was found to have leukocytosis with left flank tenderness and has been admitted for IV antibiotics. Patient does not have any vomiting though she does have nausea. Denies any diarrhea. Patient specifically denies any lower extremity numbness, weakness or any incontinence of urine or bowels. Denies any chest pain or shortness of breath.  Past Medical History  Diagnosis Date  . Hypertension   . Breast cancer   . 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 03/21/2011  . GERD (gastroesophageal reflux disease)     Past Surgical History  Procedure Date  . Cesarean section   . Breast surgery     mastectomy  . Tubal ligation   . Back surgery   . Laminectomy     History reviewed. No pertinent family history. Social History:  reports that she has been smoking.  She has never used smokeless tobacco. She reports that she does not drink alcohol or use illicit drugs.  Allergies: No Known Allergies   (Not in a hospital admission)  Results for orders placed during the hospital encounter of 10/01/11 (from the past 48 hour(s))  URINALYSIS, ROUTINE W REFLEX MICROSCOPIC     Status: Abnormal   Collection Time   10/01/11  3:47 PM      Component Value Range Comment   Color, Urine YELLOW  YELLOW     APPearance HAZY (*) CLEAR     Specific Gravity, Urine 1.007  1.005 - 1.030     pH 6.5  5.0 - 8.0     Glucose, UA NEGATIVE  NEGATIVE (mg/dL)    Hgb urine dipstick LARGE (*) NEGATIVE     Bilirubin Urine NEGATIVE   NEGATIVE     Ketones, ur NEGATIVE  NEGATIVE (mg/dL)    Protein, ur 30 (*) NEGATIVE (mg/dL)    Urobilinogen, UA 1.0  0.0 - 1.0 (mg/dL)    Nitrite NEGATIVE  NEGATIVE     Leukocytes, UA LARGE (*) NEGATIVE    URINE MICROSCOPIC-ADD ON     Status: Abnormal   Collection Time   10/01/11  3:47 PM      Component Value Range Comment   Squamous Epithelial / LPF FEW (*) RARE     WBC, UA 11-20  <3 (WBC/hpf)    RBC / HPF 7-10  <3 (RBC/hpf)    Bacteria, UA FEW (*) RARE    POCT PREGNANCY, URINE     Status: Normal   Collection Time   10/01/11  3:52 PM      Component Value Range Comment   Preg Test, Ur NEGATIVE  NEGATIVE    CBC     Status: Abnormal   Collection Time   10/01/11  4:38 PM      Component Value Range Comment   WBC 13.1 (*) 4.0 - 10.5 (K/uL)    RBC 4.50  3.87 - 5.11 (MIL/uL)    Hemoglobin 12.3  12.0 - 15.0 (g/dL)    HCT 81.1  91.4 - 78.2 (%)    MCV 80.9  78.0 - 100.0 (fL)    MCH 27.3  26.0 - 34.0 (pg)    MCHC 33.8  30.0 - 36.0 (g/dL)    RDW 16.1 (*) 09.6 - 15.5 (%)    Platelets 219  150 - 400 (K/uL)   DIFFERENTIAL     Status: Abnormal   Collection Time   10/01/11  4:38 PM      Component Value Range Comment   Neutrophils Relative 91 (*) 43 - 77 (%)    Lymphocytes Relative 5 (*) 12 - 46 (%)    Monocytes Relative 4  3 - 12 (%)    Eosinophils Relative 0  0 - 5 (%)    Basophils Relative 0  0 - 1 (%)    Neutro Abs 11.9 (*) 1.7 - 7.7 (K/uL)    Lymphs Abs 0.7  0.7 - 4.0 (K/uL)    Monocytes Absolute 0.5  0.1 - 1.0 (K/uL)    Eosinophils Absolute 0.0  0.0 - 0.7 (K/uL)    Basophils Absolute 0.0  0.0 - 0.1 (K/uL)    WBC Morphology INCREASED BANDS (>20% BANDS)     COMPREHENSIVE METABOLIC PANEL     Status: Abnormal   Collection Time   10/01/11  4:38 PM      Component Value Range Comment   Sodium 135  135 - 145 (mEq/L)    Potassium 2.9 (*) 3.5 - 5.1 (mEq/L)    Chloride 100  96 - 112 (mEq/L)    CO2 22  19 - 32 (mEq/L)    Glucose, Bld 155 (*) 70 - 99 (mg/dL)    BUN 13  6 - 23 (mg/dL)     Creatinine, Ser 0.45  0.50 - 1.10 (mg/dL)    Calcium 9.9  8.4 - 10.5 (mg/dL)    Total Protein 6.8  6.0 - 8.3 (g/dL)    Albumin 3.0 (*) 3.5 - 5.2 (g/dL)    AST 45 (*) 0 - 37 (U/L)    ALT 35  0 - 35 (U/L)    Alkaline Phosphatase 142 (*) 39 - 117 (U/L)    Total Bilirubin 0.4  0.3 - 1.2 (mg/dL)    GFR calc non Af Amer 70 (*) >90 (mL/min)    GFR calc Af Amer 81 (*) >90 (mL/min)    Dg Chest 2 View  10/01/2011  *RADIOLOGY REPORT*  Clinical Data: History breast cancer, now with shortness of breath, fever and cough  CHEST - 2 VIEW  Comparison: 04/29/2009; 05/17/2008; PET CT - 01/06/2010  Findings:  Unchanged cardiac silhouette and mediastinal contours.  Unchanged nodular opacity overlying the left lower lung favored to represent a nipple shadow. Minimal bilateral linear infrahilar heterogeneous opacities favored to represent atelectasis.  No discrete focal airspace opacities.  No pleural effusion or pneumothorax.  Grossly unchanged bones.  Right axillary surgical clips.  IMPRESSION: No acute cardiopulmonary disease.  Specifically, no evidence of pneumonia.  Original Report Authenticated By: Waynard Reeds, M.D.    Review of Systems  Constitutional: Positive for fever and chills.  HENT: Negative.   Eyes: Negative.   Respiratory: Negative.   Cardiovascular: Negative.   Genitourinary: Positive for dysuria and flank pain.  Skin: Negative.   Neurological: Negative.   Endo/Heme/Allergies: Negative.   Psychiatric/Behavioral: Negative.     Blood pressure 100/66, pulse 79, temperature 98.9 F (37.2 C), temperature source Oral, resp. rate 16, last menstrual period 03/02/2011, SpO2 98.00%. Physical Exam  Constitutional: She is oriented to person, place, and time. She appears well-developed and well-nourished. No distress.  HENT:  Head: Normocephalic and atraumatic.  Right Ear: External ear normal.  Left Ear: External ear normal.  Nose: Nose normal.  Mouth/Throat: Oropharynx is clear and moist. No  oropharyngeal exudate.  Eyes: Conjunctivae are normal. Pupils are equal, round, and reactive to light. Right eye exhibits no discharge. Left eye exhibits no discharge. No scleral icterus.  Neck: Normal range of motion. Neck supple.  Cardiovascular: Normal rate and regular rhythm.   Respiratory: Effort normal and breath sounds normal.  GI: Soft. Bowel sounds are normal. She exhibits no distension. There is tenderness (Mild tenderness in the left flank.). There is no rebound.  Musculoskeletal: Normal range of motion. She exhibits no edema and no tenderness.  Neurological: She is alert and oriented to person, place, and time.       Moves all extremities.  Skin: Skin is warm and dry. She is not diaphoretic.  Psychiatric: Her behavior is normal.     Assessment/Plan #1. Left flank tenderness with dysuria and fever and chills most likely from pyelonephritis - patient has been started on IV ceftriaxone and urine cultures have been obtained. Have ordered a CT abdomen and pelvis to make sure there is no renal obstruction. I will keep patient on Zosyn and treated as complicated UTI. Pain relief medications as needed. #2. History of impending cord compression at9-11 area status post laminectomy and radiation - patient at this time specifically denies any lower extremity weakness, numbness, incontinence of bowel or incontinence of urine. On exam patient does not have any hyperreflexia or spine tenderness. We'll closely observe. #3. History of metastatic breast cancer on chemotherapy next chemotherapy is due next week - per oncologist. #4. History of hypertension - not on any antihypertensives. Closely observe. #5. Hypokalemia - replace and recheck. Check magnesium levels.  CODE STATUS - full code.  Eduard Clos 10/01/2011, 8:44 PM

## 2011-10-01 NOTE — ED Provider Notes (Signed)
History  Scribed for Hilario Quarry, MD, the patient was seen in room STRE5/STRE5. This chart was scribed by Candelaria Stagers. The patient's care started at 4:25 PM    CSN: [id number]  Arrival date & time 10/01/11  1531   None     Chief Complaint  Patient presents with  . Urinary Tract Infection    HPI [name] is a 46 y.o. female who presents to the Emergency Department complaining of dysuria, increased frequency, and fever that started three days ago.  She is also experiencing back pain, nausea, and a nonproductive cough.  She denies vomiting or vaginal discharge.  She has a h/o UTIs with her last one two months ago.  She states that these sx are similar.  Pt has h/o breast cancer that was diagnosed in 2010.  She has had a masectomy of the right breast and is currently on chemo once a month with her next treatment scheduled for the 8th of May.  She has had tumors removed from her back and reports that she is now tumor free.   Past Medical History  Diagnosis Date  . Hypertension   . Breast cancer   . 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 03/21/2011  . GERD (gastroesophageal reflux disease)     Past Surgical History  Procedure Date  . Cesarean section   . Breast surgery     mastectomy  . Tubal ligation   . Back surgery     No family history on file.  History  Substance Use Topics  . Smoking status: Current Everyday Smoker -- 0.2 packs/day  . Smokeless tobacco: Never Used  . Alcohol Use: No    OB History    Grav Para Term Preterm Abortions TAB SAB Ect Mult Living                  Review of Systems  Constitutional: Positive for fever.  Respiratory: Positive for cough. Negative for shortness of breath.   Gastrointestinal: Positive for nausea. Negative for vomiting.  Genitourinary: Positive for dysuria and frequency. Negative for vaginal discharge.  All other systems reviewed and are negative.    Allergies  Review of patient's allergies indicates no known  allergies.  Home Medications   Current Outpatient Rx  Name Route Sig Dispense Refill  . AMLODIPINE BESYLATE 5 MG PO TABS Oral Take 5 mg by mouth daily.    . CYCLOBENZAPRINE HCL 10 MG PO TABS Oral Take 10 mg by mouth 3 (three) times daily as needed. For muscle spasm    . DESLORATADINE-PSEUDOEPHED ER 5-240 MG PO TB24 Oral Take 1 tablet by mouth daily.    [name] OMEPRAZOLE 20 MG PO CPDR Oral Take 20 mg by mouth daily.    . OXYCODONE-ACETAMINOPHEN 5-325 MG PO TABS Oral Take 1 tablet by mouth every 4 (four) hours as needed. For pain    . PROMETHAZINE HCL 25 MG PO TABS Oral Take 12.5-25 mg by mouth every 6 (six) hours as needed.    . SUCRALFATE 1 G PO TABS Oral Take 1 g by mouth 4 (four) times daily.      BP 126/81  Pulse 113  Temp(Src) 101.5 F (38.6 C) (Oral)  Resp 17  SpO2 100%  LMP 03/02/2011  Physical Exam  Nursing note and vitals reviewed. Constitutional: She appears well-developed and well-nourished.  HENT:  Head: Normocephalic and atraumatic.  Neck: Normal range of motion. Neck supple.  Cardiovascular: Regular rhythm.        tachycardic  Pulmonary/Chest: Effort normal and breath sounds normal.  Musculoskeletal: She exhibits tenderness. She exhibits no edema (left leg).       Patient complains of some tenderness with palpation of the medial upper aspect of the left calf  Skin: Skin is warm and dry.  Psychiatric: She has a normal mood and affect. Her behavior is normal.    ED Course  Procedures   DIAGNOSTIC STUDIES: Oxygen Saturation is 100% on room air, normal by my interpretation.    COORDINATION OF CARE: 4:45PM Ordered: sodium chloride 0.9% bolus 1,040mL; cefTRIAXone (ROCEPHIN); CBC; Differential: Comprehensive metabolic panel; blood culture; urine culture; DG Chest 2 View; acetaminophen tablet 650 mg; Pregnancy, urine POC; Urine microscopic-add on; POCT pregnancy, Urine (NOT at [facility]); Urinalysis, Routine w reflex microscopic      Labs Reviewed  URINALYSIS, ROUTINE W  REFLEX MICROSCOPIC - Abnormal; Notable for the following:    APPearance HAZY (*)    Hgb urine dipstick LARGE (*)    Protein, ur 30 (*)    Leukocytes, UA LARGE (*)    All other components within normal limits  URINE MICROSCOPIC-ADD ON - Abnormal; Notable for the following:    Squamous Epithelial / LPF FEW (*)    Bacteria, UA FEW (*)    All other components within normal limits  POCT PREGNANCY, URINE  CBC  DIFFERENTIAL  COMPREHENSIVE METABOLIC PANEL  CULTURE, BLOOD (ROUTINE X 2)  CULTURE, BLOOD (ROUTINE X 2)  URINE CULTURE   No results found.   No diagnosis found.  I personally performed the services described in this documentation, which was scribed in my presence. The recorded information has been reviewed and considered.   MDM  Patient's care discussed with Rhea Bleacher PAC. Patient will be transferred to CDU and placed on Tylenol arthritis protocol. She's had blood cultures drawn. She is given Rocephin here. Her urine is to be cultured. Given her history of metastatic cancer, we will have a low threshold for admitting the patient     Hilario Quarry, MD 10/01/11 [phone]

## 2011-10-01 NOTE — ED Provider Notes (Signed)
History     CSN: [id number]  Arrival date & time 10/01/11  1531   First MD Initiated Contact with Patient 10/01/11 1612      Chief Complaint  Patient presents with  . Urinary Tract Infection    (Consider location/radiation/quality/duration/timing/severity/associated sxs/prior treatment) HPI  Past Medical History  Diagnosis Date  . Hypertension   . Breast cancer   . 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 03/21/2011  . GERD (gastroesophageal reflux disease)     Past Surgical History  Procedure Date  . Cesarean section   . Breast surgery     mastectomy  . Tubal ligation   . Back surgery     No family history on file.  History  Substance Use Topics  . Smoking status: Current Everyday Smoker -- 0.2 packs/day  . Smokeless tobacco: Never Used  . Alcohol Use: No    OB History    Grav Para Term Preterm Abortions TAB SAB Ect Mult Living                  Review of Systems  Allergies  Review of patient's allergies indicates no known allergies.  Home Medications   Current Outpatient Rx  Name Route Sig Dispense Refill  . AMLODIPINE BESYLATE 5 MG PO TABS Oral Take 5 mg by mouth daily.    . CYCLOBENZAPRINE HCL 10 MG PO TABS Oral Take 10 mg by mouth 3 (three) times daily as needed. For muscle spasm    . DESLORATADINE-PSEUDOEPHED ER 5-240 MG PO TB24 Oral Take 1 tablet by mouth daily.    [name] OMEPRAZOLE 20 MG PO CPDR Oral Take 20 mg by mouth daily.    . OXYCODONE-ACETAMINOPHEN 5-325 MG PO TABS Oral Take 1 tablet by mouth every 4 (four) hours as needed. For pain    . PROMETHAZINE HCL 25 MG PO TABS Oral Take 12.5-25 mg by mouth every 6 (six) hours as needed.    . SUCRALFATE 1 G PO TABS Oral Take 1 g by mouth 4 (four) times daily.      BP 126/81  Pulse 113  Temp(Src) 101.5 F (38.6 C) (Oral)  Resp 17  SpO2 100%  LMP 03/02/2011  Physical Exam  ED Course  Procedures (including critical care time)  Labs Reviewed  URINALYSIS, ROUTINE W REFLEX MICROSCOPIC - Abnormal;  Notable for the following:    APPearance HAZY (*)    Hgb urine dipstick LARGE (*)    Protein, ur 30 (*)    Leukocytes, UA LARGE (*)    All other components within normal limits  URINE MICROSCOPIC-ADD ON - Abnormal; Notable for the following:    Squamous Epithelial / LPF FEW (*)    Bacteria, UA FEW (*)    All other components within normal limits  CBC - Abnormal; Notable for the following:    WBC 13.1 (*)    RDW 17.6 (*)    All other components within normal limits  DIFFERENTIAL - Abnormal; Notable for the following:    Neutrophils Relative 91 (*)    Lymphocytes Relative 5 (*)    Neutro Abs 11.9 (*)    All other components within normal limits  COMPREHENSIVE METABOLIC PANEL - Abnormal; Notable for the following:    Potassium 2.9 (*)    Glucose, Bld 155 (*)    Albumin 3.0 (*)    AST 45 (*)    Alkaline Phosphatase 142 (*)    GFR calc non Af Amer 70 (*)    GFR calc Af Denyse Dago  81 (*)    All other components within normal limits  POCT PREGNANCY, URINE  CULTURE, BLOOD (ROUTINE X 2)  CULTURE, BLOOD (ROUTINE X 2)  URINE CULTURE   Dg Chest 2 View  10/01/2011  *RADIOLOGY REPORT*  Clinical Data: History breast cancer, now with shortness of breath, fever and cough  CHEST - 2 VIEW  Comparison: 04/29/2009; 05/17/2008; PET CT - 01/06/2010  Findings:  Unchanged cardiac silhouette and mediastinal contours.  Unchanged nodular opacity overlying the left lower lung favored to represent a nipple shadow. Minimal bilateral linear infrahilar heterogeneous opacities favored to represent atelectasis.  No discrete focal airspace opacities.  No pleural effusion or pneumothorax.  Grossly unchanged bones.  Right axillary surgical clips.  IMPRESSION: No acute cardiopulmonary disease.  Specifically, no evidence of pneumonia.  Original Report Authenticated By: Waynard Reeds, M.D.     1. Pyelonephritis     5:21 PM Patient to CDU from stretcher triage. Handoff from Dr. Rosalia Hammers. Likely pyelo in setting of monthly  chemotherapy for breast CA. Will attempt to stabilize in CDU. Low threshold for admission if not improving. IV rocephin ordered. Blood counts pending.   Vital signs reviewed and are as follows: Filed Vitals:   10/01/11 1535  BP: 126/81  Pulse: 113  Temp: 101.5 F (38.6 C)  Resp: 17   Dysuria and L flank pain x 3 days. Currently nauseous. She is requesting food.   6:01 PM Exam:  Gen NAD; Heart RRR, nml S1,S2, no m/r/g; Lungs CTAB; Abd soft, NT, no rebound or guarding, L CVA tenderness; Ext 2+ pedal pulses bilaterally, no edema.  Potassium 2.9, will replete.   7:26 PM Admit due to immunocompromise, bandemia. [facility] Team 4.    MDM  Admit for pyelo.         Renne Crigler, [place] 10/01/11 1927

## 2011-10-01 NOTE — ED Notes (Addendum)
First meeting with patient. Patient states she was having burning on urination and odar like when she has a UTI x 3-4 days ago and started to have right side pain x 2 days ago.  Patient being actively treated for breast cancer with her last treatment last month and next treatment scheduled for May 8th.  Patient c/o of nausea.

## 2011-10-01 NOTE — Progress Notes (Signed)
ANTIBIOTIC CONSULT NOTE - INITIAL  Pharmacy Consult for Zosyn Indication: Pyelonephritis  No Known Allergies  Patient Measurements:  Weight: 50.8kg  Vital Signs: Temp: 98.9 F (37.2 C) (05/02 1850) Temp src: Oral (05/02 1850) BP: 100/66 mmHg (05/02 1850) Pulse Rate: 79  (05/02 1850) Intake/Output from previous day:   Intake/Output from this shift:    Labs:  Basename 10/01/11 1638  WBC 13.1*  HGB 12.3  PLT 219  LABCREA --  CREATININE 0.96   The CrCl is unknown because both a height and weight (above a minimum accepted value) are required for this calculation. No results found for this basename: VANCOTROUGH:2,VANCOPEAK:2,VANCORANDOM:2,GENTTROUGH:2,GENTPEAK:2,GENTRANDOM:2,TOBRATROUGH:2,TOBRAPEAK:2,TOBRARND:2,AMIKACINPEAK:2,AMIKACINTROU:2,AMIKACIN:2, in the last 72 hours   Microbiology: No results found for this or any previous visit (from the past 720 hour(s)).  Medical History: Past Medical History  Diagnosis Date  . Hypertension   . Breast cancer   . 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 03/21/2011  . GERD (gastroesophageal reflux disease)     Medications:  See electronic med rec  Assessment: 45yof to start Zosyn for suspected pyelonephritis. Patient received Ceftriaxone 1g in ED (~1730).  - CrCl ~58 ml/min  - Tmax 101.5, WBC 13.1  Goal of Therapy:  Clinical improvement  Plan:  1. Zosyn 3.375g IV q8h - infuse over 4hrs 2. Monitor renal function, cultures and adjust as indicated  Cleon Dew [phone] 10/01/2011,9:25 PM

## 2011-10-02 DIAGNOSIS — E876 Hypokalemia: Secondary | ICD-10-CM

## 2011-10-02 DIAGNOSIS — R5381 Other malaise: Secondary | ICD-10-CM

## 2011-10-02 DIAGNOSIS — C50919 Malignant neoplasm of unspecified site of unspecified female breast: Secondary | ICD-10-CM

## 2011-10-02 LAB — COMPREHENSIVE METABOLIC PANEL
ALT: 52 U/L — ABNORMAL HIGH (ref 0–35)
Alkaline Phosphatase: 141 U/L — ABNORMAL HIGH (ref 39–117)
BUN: 8 mg/dL (ref 6–23)
CO2: 21 mEq/L (ref 19–32)
GFR calc Af Amer: >90 mL/min (ref >90)
GFR calc non Af Amer: 83 mL/min — ABNORMAL LOW (ref >90)
Glucose, Bld: 163 mg/dL — ABNORMAL HIGH (ref 70–99)
Potassium: 4.4 mEq/L (ref 3.5–5.1)
Sodium: 137 mEq/L (ref 135–145)

## 2011-10-02 LAB — CBC
MCHC: 33.8 g/dL (ref 30.0–36.0)
Platelets: 191 10*3/uL (ref 150–400)
RDW: 18 % — ABNORMAL HIGH (ref 11.5–15.5)
WBC: 10.4 10*3/uL (ref 4.0–10.5)

## 2011-10-02 LAB — DIFFERENTIAL
Basophils Absolute: 0 10*3/uL (ref 0.0–0.1)
Eosinophils Absolute: 0.1 10*3/uL (ref 0.0–0.7)
Lymphocytes Relative: 7 % — ABNORMAL LOW (ref 12–46)
Monocytes Relative: 4 % (ref 3–12)
Neutrophils Relative %: 88 % — ABNORMAL HIGH (ref 43–77)
WBC Morphology: INCREASED

## 2011-10-02 LAB — URINE CULTURE

## 2011-10-02 NOTE — Progress Notes (Signed)
ANTIBIOTIC CONSULT NOTE - FOLLOW UP  Pharmacy Consult for Zosyn Indication: Pyelonephritis  No Known Allergies  Patient Measurements: Height: 5\' 2"  (157.5 cm) Weight: 117 lb 8.1 oz (53.3 kg) IBW/kg (Calculated) : 50.1   Vital Signs: Temp: 98 F (36.7 C) (05/03 0500) Temp src: Oral (05/03 0500) BP: 113/72 mmHg (05/03 0500) Pulse Rate: 100  (05/03 0500) Intake/Output from previous day: 05/02 0701 - 05/03 0700 In: 1764.2 [P.O.:960; I.V.:704.2; IV Piggyback:100] Out: -  Intake/Output from this shift:    Labs:  [facility] 10/02/11 0619 10/01/11 1638  WBC 10.4 13.1*  HGB 11.0* 12.3  PLT 191 219  LABCREA -- --  CREATININE 0.83 0.96   Estimated Creatinine Clearance: 67 ml/min (by C-G formula based on Cr of 0.83). No results found for this basename: VANCOTROUGH:2,VANCOPEAK:2,VANCORANDOM:2,GENTTROUGH:2,GENTPEAK:2,GENTRANDOM:2,TOBRATROUGH:2,TOBRAPEAK:2,TOBRARND:2,AMIKACINPEAK:2,AMIKACINTROU:2,AMIKACIN:2, in the last 72 hours   Microbiology: Recent Results (from the past 720 hour(s))  CULTURE, BLOOD (ROUTINE X 2)     Status: Normal (Preliminary result)   Collection Time   10/01/11  4:42 PM      Component Value Range Status Comment   Specimen Description BLOOD ARM LEFT   Final    Special Requests BOTTLES DRAWN AEROBIC ONLY 10CC   Final    Culture  Setup Time [id number]   Final    Culture     Final    Value:        BLOOD CULTURE RECEIVED NO GROWTH TO DATE CULTURE WILL BE HELD FOR 5 DAYS BEFORE ISSUING A FINAL NEGATIVE REPORT   Report Status PENDING   Incomplete     Anti-infectives     Start     Dose/Rate Route Frequency Ordered Stop   10/01/11 2200  piperacillin-tazobactam (ZOSYN) IVPB 3.375 g       3.375 g 12.5 mL/hr over 240 Minutes Intravenous 3 times per day 10/01/11 2130     10/01/11 1645   cefTRIAXone (ROCEPHIN) 1 g in dextrose 5 % 50 mL IVPB        1 g 100 mL/hr over 30 Minutes Intravenous  Once 10/01/11 1636 10/01/11 1744          Assessment: Pyelonephritis  - Continues on empiric therapy with Zosyn. Renal function is stable  Goal of Therapy:  Eradication of Infection  Plan:  Continue Zosyn 3.375gm IV q8h extended infusion As no dosage adjustments are anticipated Pharmacy will sign off.  Estella Husk, Pharm.D., BCPS Clinical Pharmacist  Pager [phone] 10/02/2011, 10:17 AM

## 2011-10-02 NOTE — Progress Notes (Signed)
Patient ID: [name], female   DOB: 02/13/1966, 46 y.o.   MRN: [id number]  PATIENT DETAILS Name: [name] Age: 46 y.o. Sex: female Date of Birth: 03-Aug-1965 Admit Date: 10/01/2011 PCP:No primary provider on file.  Interim History: Patient still with significant left flank tenderness.   Subjective: Complain of continuous left sided back pain.  Objective: Weight change:   Intake/Output Summary (Last 24 hours) at 10/02/11 1614 Last data filed at 10/02/11 1300  Gross per 24 hour  Intake 1986.17 ml  Output      0 ml  Net 1986.17 ml   Blood pressure 135/87, pulse 100, temperature 99 F (37.2 C), temperature source Oral, resp. rate 20, height 5\' 2"  (1.575 m), weight 53.3 kg (117 lb 8.1 oz), last menstrual period 03/02/2011, SpO2 100.00%. Filed Vitals:   10/01/11 2100 10/02/11 0500 10/02/11 1034 10/02/11 1325  BP: 137/88 113/72 142/88 135/87  Pulse: 98 100  100  Temp: 99.9 F (37.7 C) 98 F (36.7 C)  99 F (37.2 C)  TempSrc: Oral Oral  Oral  Resp: 18 18  20   Height: 5\' 2"  (1.575 m)     Weight: 53.3 kg (117 lb 8.1 oz)     SpO2: 99% 96%  100%    Physical Exam: General: No acute distress Lungs: Clear to auscultation bilaterally without wheezes or crackles Cardiovascular: Regular rate and rhythm without murmur gallop or rub normal S1 and S2 Abdomen: exquisitely tender on left, nondistended, soft, bowel sounds positive, no rebound, no ascites, no appreciable mass Extremities: No significant cyanosis, clubbing, or edema bilateral lower extremities  Results for Andrea, Mullins (MRN [id number]) as of 10/02/2011 07:34  Ref. Range 10/01/2011 15:47  Color, Urine Latest Range: YELLOW  YELLOW  APPearance Latest Range: CLEAR  HAZY (A)  Specific Gravity, Urine Latest Range: 1.005-1.030  1.007  pH Latest Range: 4.6-8.0  6.5  Glucose, UA Latest Range: NEGATIVE mg/dL NEGATIVE  Bilirubin Urine Latest Range: NEGATIVE  NEGATIVE  Ketones, ur Latest Range: NEGATIVE mg/dL NEGATIVE    Protein Latest Range: NEGATIVE mg/dL 30 (A)  Urobilinogen, UA Latest Range: 0.0-1.0 mg/dL 1.0  Nitrite Latest Range: NEGATIVE  NEGATIVE  Leukocytes, UA Latest Range: NEGATIVE  LARGE (A)  Hgb urine dipstick Latest Range: NEGATIVE  LARGE (A)  WBC, UA Latest Range: <3 WBC/hpf 11-20  RBC / HPF Latest Range: <3 RBC/hpf 7-10  Squamous Epithelial / LPF Latest Range: RARE  FEW (A)  Bacteria, UA Latest Range: RARE  FEW (A)     Basic Metabolic Panel:  Lab 10/02/11 8657 10/01/11 2154 10/01/11 1638  NA 137 -- 135  K 4.4 -- 2.9*  CL 108 -- 100  CO2 21 -- 22  GLUCOSE 163* -- 155*  BUN 8 -- 13  CREATININE 0.83 -- 0.96  CALCIUM 9.1 -- 9.9  MG -- 1.9 --  PHOS -- -- --   Liver Function Tests:  Lab 10/02/11 0619 10/01/11 1638  AST 55* 45*  ALT 52* 35  ALKPHOS 141* 142*  BILITOT 0.7 0.4  PROT 6.1 6.8  ALBUMIN 2.5* 3.0*   CBC:  Lab 10/02/11 0619 10/01/11 1638  WBC 10.4 13.1*  NEUTROABS 9.2* 11.9*  HGB 11.0* 12.3  HCT 32.5* 36.4  MCV 81.7 80.9  PLT 191 219    Micro Results: Recent Results (from the past 240 hour(s))  CULTURE, BLOOD (ROUTINE X 2)     Status: Normal (Preliminary result)   Collection Time   10/01/11  4:42 PM  Component Value Range Status Comment   Specimen Description BLOOD ARM LEFT   Final    Special Requests BOTTLES DRAWN AEROBIC ONLY 10CC   Final    Culture  Setup Time [id number]   Final    Culture     Final    Value:        BLOOD CULTURE RECEIVED NO GROWTH TO DATE CULTURE WILL BE HELD FOR 5 DAYS BEFORE ISSUING A FINAL NEGATIVE REPORT   Report Status PENDING   Incomplete     Studies/Results:  CT Abd/Pelvis IMPRESSION:  1. Lower pole left renal calculi but no definite obstructing ureteral calculi.  2. Both kidneys appear somewhat enlarged and edematous. Recommend correlation with renal function tests.  3. Stable to smaller metastatic hepatic lesions.  4. Wide decompressive thoracic laminectomy.   Scheduled Meds:    . amLODipine  5 mg Oral Daily   . cefTRIAXone (ROCEPHIN)  IV  1 g Intravenous Once  . ondansetron  4 mg Intravenous Once  . pantoprazole  40 mg Oral Q1200  . piperacillin-tazobactam (ZOSYN)  IV  3.375 g Intravenous Q8H  . potassium chloride  10 mEq Intravenous Q1 Hr x 2  . potassium chloride  10 mEq Intravenous Once  . potassium chloride  40 mEq Oral Once  . sodium chloride  1,000 mL Intravenous Once  . sodium chloride  3 mL Intravenous Q12H  . sucralfate  1 g Oral TID AC & HS   Continuous Infusions:    . 0.9 % NaCl with KCl 20 mEq / L 125 mL/hr at 10/02/11 0832   PRN Meds:.acetaminophen, acetaminophen, cyclobenzaprine, HYDROmorphone (DILAUDID) injection, ondansetron (ZOFRAN) IV, ondansetron, oxyCODONE-acetaminophen, promethazine  Anti-infectives:  Anti-infectives     Start     Dose/Rate Route Frequency Ordered Stop   10/01/11 2200   piperacillin-tazobactam (ZOSYN) IVPB 3.375 g        3.375 g 12.5 mL/hr over 240 Minutes Intravenous 3 times per day 10/01/11 2130     10/01/11 1645   cefTRIAXone (ROCEPHIN) 1 g in dextrose 5 % 50 mL IVPB        1 g 100 mL/hr over 30 Minutes Intravenous  Once 10/01/11 1636 10/01/11 1744          Assessment/Plan: Principal Problem:  *Pyelonephritis Active Problems:  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  Hypertension   1.  Pyelnonephritis - patient on zosyn.  Awaiting urine cultures.  2.  Hypertension - controlled on amlodipine.  3.  History of breast cancer.  4.  DVT Prophylaxis.  SCDs.    LOS: 1 day   Stephani Police 10/02/2011, 4:14 PM [phone]

## 2011-10-02 NOTE — Progress Notes (Signed)
Addendum  Patient seen and examined, chart and data base reviewed.  I agree with the above assessment and plan  For full details please see Mrs. Algis Downs PA. Note.  Clint Lipps Pager: [phone] 10/02/2011, 4:35 PM

## 2011-10-02 NOTE — Progress Notes (Signed)
Utilization review complete 

## 2011-10-03 DIAGNOSIS — C50919 Malignant neoplasm of unspecified site of unspecified female breast: Secondary | ICD-10-CM

## 2011-10-03 DIAGNOSIS — E876 Hypokalemia: Secondary | ICD-10-CM

## 2011-10-03 DIAGNOSIS — R5381 Other malaise: Secondary | ICD-10-CM

## 2011-10-03 LAB — CBC
HCT: 33.2 % — ABNORMAL LOW (ref 36.0–46.0)
Hemoglobin: 11.2 g/dL — ABNORMAL LOW (ref 12.0–15.0)
MCV: 80.6 fL (ref 78.0–100.0)
Platelets: 212 10*3/uL (ref 150–400)
RBC: 4.12 MIL/uL (ref 3.87–5.11)
WBC: 7 10*3/uL (ref 4.0–10.5)

## 2011-10-03 LAB — HEMOGLOBIN A1C: Hgb A1c MFr Bld: 6.3 % — ABNORMAL HIGH (ref <5.7)

## 2011-10-03 LAB — BASIC METABOLIC PANEL
CO2: 23 mEq/L (ref 19–32)
Chloride: 108 mEq/L (ref 96–112)
Creatinine, Ser: 0.74 mg/dL (ref 0.50–1.10)
Glucose, Bld: 148 mg/dL — ABNORMAL HIGH (ref 70–99)

## 2011-10-03 LAB — URINE CULTURE: Colony Count: NO GROWTH

## 2011-10-03 MED ORDER — CEFUROXIME AXETIL 500 MG PO TABS: 500.0000 mg | ORAL_TABLET | Freq: Two times a day (BID) | ORAL | Status: AC

## 2011-10-03 MED ORDER — OXYCODONE-ACETAMINOPHEN 5-325 MG PO TABS: 1.0000 | ORAL_TABLET | ORAL | Status: DC | PRN

## 2011-10-03 MED ORDER — POTASSIUM CHLORIDE CRYS ER 20 MEQ PO TBCR
60.0000 meq | EXTENDED_RELEASE_TABLET | Freq: Once | ORAL | Status: AC
  Administered 2011-10-03: 60 meq via ORAL
  Filled 2011-10-03: qty 3

## 2011-10-03 NOTE — Discharge Summary (Signed)
HOSPITAL DISCHARGE SUMMARY  [name]  MRN: [id number]  DOB:Dec 04, 1965  Date of Admission: 10/01/2011 Date of Discharge: 10/03/2011         LOS: 2 days   Attending Physician:  Clydia Llano A  Patient's PCP:  No primary provider on file.  Consults: None  Discharge Diagnoses: Present on Admission:  .Pyelonephritis .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 .Hypertension   Medication List  As of 10/03/2011 11:32 AM   TAKE these medications         amLODipine 5 MG tablet   Commonly known as: NORVASC   Take 5 mg by mouth daily.      cefUROXime 500 MG tablet   Commonly known as: CEFTIN   Take 1 tablet (500 mg total) by mouth 2 (two) times daily.      cyclobenzaprine 10 MG tablet   Commonly known as: FLEXERIL   Take 10 mg by mouth 3 (three) times daily as needed. For muscle spasm      desloratadine-pseudoephedrine 5-240 MG per 24 hr tablet   Commonly known as: CLARINEX-D 24-hour   Take 1 tablet by mouth daily.      omeprazole 20 MG capsule   Commonly known as: PRILOSEC   Take 20 mg by mouth daily.      oxyCODONE-acetaminophen 5-325 MG per tablet   Commonly known as: PERCOCET   Take 1 tablet by mouth every 4 (four) hours as needed. For pain      promethazine 25 MG tablet   Commonly known as: PHENERGAN   Take 12.5-25 mg by mouth every 6 (six) hours as needed.      sucralfate 1 G tablet   Commonly known as: CARAFATE   Take 1 g by mouth 4 (four) times daily.             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 In addition patient also had fever chills and dysuria and denies any vaginal discharge. In the ER patient was found to have leukocytosis with left flank tenderness and has been admitted for  IV antibiotics. Patient does not have any vomiting though she does have nausea. Denies any diarrhea. Patient specifically denies any lower extremity numbness, weakness or any incontinence of urine or bowels. Denies any chest pain or shortness of breath.  Hospital Course: Present on Admission:  .Pyelonephritis .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 .Hypertension  1. Left-sided pyelonephritis: Patient came in with worsening left flank pain over the 3 days prior to admission, in addition to experiencing fever, chills and dysuria. Patient's UA was strongly positive in the emergency department. Surprisingly the culture did not show growth. Likely patient started on antibiotics before the urine culture was obtained. CT scan of abdomen pelvis showed bilateral renal swelling. The patient was started on Zosyn in the emergency department, she is afebrile. Her nausea and vomiting resolved. She was tolerating food well and she wanted to go home. Ceftin was prescribed. To complete total 14 days of antibiotics. Please note that she asked to be discharged.  2. Metastatic breast cancer: Patient follows with Dr. Mervin Hack, patient has previous impending cord compression status post thoracic laminectomy and radiation in October of 2012.  3. Hypertension: She is on amlodipine 5 mg this was continued throughout the hospital stay blood pressure  stable.   Day of Discharge BP 122/80  Pulse 82  Temp(Src) 98.3 F (36.8 C) (Oral)  Resp 16  Ht 5\' 2"  (1.575 m)  Wt 53.3 kg (117 lb 8.1 oz)  BMI 21.49 kg/m2  SpO2 100%  LMP 03/02/2011 Physical Exam: GEN: No acute distress, cooperative with exam PSYCH: alert and oriented x4; does not appear anxious does not appear depressed; affect is normal  HEENT: Mucous membranes pink and anicteric;  Mouth: without oral thrush or lesions Eyes: PERRLA; EOM intact;  Neck: no cervical lymphadenopathy nor thyromegaly or carotid bruit; no JVD;  CHEST WALL: No tenderness, symmetrical to  breathing bilaterally CHEST: Normal respiration, clear to auscultation bilaterally  HEART: Regular rate and rhythm; no murmurs, rubs or gallops, S1 and S2 heard  BACK: No kyphosis or scoliosis; no CVA tenderness  ABDOMEN:  soft non-tender; no masses, no organomegaly, normal abdominal bowel sounds; no pannus; no intertriginous candida.  EXTREMITIES: No bone or joint deformity; no edema; no ulcerations.  PULSES: 2+ and symmetric, neurovascularity is intact SKIN: Normal hydration no rash or ulceration, no flushing or suspicious lesions  CNS: Cranial nerves 2-12 grossly intact no focal neurologic deficit, coordination is intact gait not tested    Results for orders placed during the hospital encounter of 10/01/11 (from the past 24 hour(s))  CBC     Status: Abnormal   Collection Time   10/03/11  6:00 AM      Component Value Range   WBC 7.0  4.0 - 10.5 (K/uL)   RBC 4.12  3.87 - 5.11 (MIL/uL)   Hemoglobin 11.2 (*) 12.0 - 15.0 (g/dL)   HCT 16.1 (*) 09.6 - 46.0 (%)   MCV 80.6  78.0 - 100.0 (fL)   MCH 27.2  26.0 - 34.0 (pg)   MCHC 33.7  30.0 - 36.0 (g/dL)   RDW 04.5 (*) 40.9 - 15.5 (%)   Platelets 212  150 - 400 (K/uL)  BASIC METABOLIC PANEL     Status: Abnormal   Collection Time   10/03/11  6:00 AM      Component Value Range   Sodium 141  135 - 145 (mEq/L)   Potassium 3.4 (*) 3.5 - 5.1 (mEq/L)   Chloride 108  96 - 112 (mEq/L)   CO2 23  19 - 32 (mEq/L)   Glucose, Bld 148 (*) 70 - 99 (mg/dL)   BUN 6  6 - 23 (mg/dL)   Creatinine, Ser 8.11  0.50 - 1.10 (mg/dL)   Calcium 9.4  8.4 - 91.4 (mg/dL)   GFR calc non Af Amer >90  >90 (mL/min)   GFR calc Af Amer >90  >90 (mL/min)    Disposition: Home   Follow-up Appts: Discharge Orders    Future Appointments: Provider: Department: Dept Phone: Center:   10/07/2011 12:45 PM Dava Najjar Idelle Jo [facility] [phone] None   10/07/2011 1:15 PM Amy Allegra Grana, PA [facility] [phone] None   11/04/2011 12:30 PM Mauri Brooklyn [facility]  [phone] None   11/04/2011 1:00 PM [facility] [phone] None   12/02/2011 12:15 PM Windell Hummingbird [facility] [phone] None   12/02/2011 12:45 PM [facility] [phone] None   12/30/2011 12:30 PM Mauri Brooklyn [facility] [phone] None   12/30/2011 1:00 PM [facility] [phone] None   01/27/2012 12:30 PM Sherrie Mustache [facility] [phone] None   01/27/2012 1:00 PM [facility] [phone] None  Follow-up Information    Follow up with Lowella Dell, MD on 10/07/2011.   Contact information:   [address] [place] [address] [phone]          I spent 40 minutes completing paperwork and coordinating discharge efforts.  SignedClydia Llano A 10/03/2011, 11:32 AM

## 2011-10-03 NOTE — Progress Notes (Signed)
Nsg Discharge Note  Admit Date:  10/01/2011 Discharge date: 10/03/2011   [name] to be D/C'd Home per MD order.  AVS completed.  Copy for chart, and copy for patient signed, and dated. Patient/caregiver able to verbalize understanding.  Discharge Medication:  [name], [name]  Home Medication Instructions ONG:[id number]   Printed on:10/03/11 1346  Medication Information                    amLODipine (NORVASC) 5 MG tablet Take 5 mg by mouth daily.           cyclobenzaprine (FLEXERIL) 10 MG tablet Take 10 mg by mouth 3 (three) times daily as needed. For muscle spasm           desloratadine-pseudoephedrine (CLARINEX-D 24-HOUR) 5-240 MG per 24 hr tablet Take 1 tablet by mouth daily.           omeprazole (PRILOSEC) 20 MG capsule Take 20 mg by mouth daily.           promethazine (PHENERGAN) 25 MG tablet Take 12.5-25 mg by mouth every 6 (six) hours as needed.           sucralfate (CARAFATE) 1 G tablet Take 1 g by mouth 4 (four) times daily.           cefUROXime (CEFTIN) 500 MG tablet Take 1 tablet (500 mg total) by mouth 2 (two) times daily.           oxyCODONE-acetaminophen (PERCOCET) 5-325 MG per tablet Take 1 tablet by mouth every 4 (four) hours as needed. For pain             Discharge Assessment: Filed Vitals:   10/03/11 0500  BP: 122/80  Pulse: 82  Temp: 98.3 F (36.8 C)  Resp: 16   Skin clean, dry and intact without evidence of skin break down, no evidence of skin tears noted. IV catheter discontinued intact. Site without signs and symptoms of complications - no redness or edema noted at insertion site, patient denies c/o pain - only slight tenderness at site.  Dressing with slight pressure applied.  D/c Instructions-Education: Discharge instructions given to patient/family with verbalized understanding. D/c education completed with patient/family including follow up instructions, medication list, d/c activities limitations if indicated, with other d/c  instructions as indicated by MD - patient able to verbalize understanding, all questions fully answered. Patient instructed to return to ED, call 911, or call MD for any changes in condition.  Patient escorted via WC, and D/C home via private auto.  Arpan Eskelson Consuella Lose, RN 10/03/2011 1:46 PM

## 2011-10-05 NOTE — Discharge Instructions (Signed)
I understand that if any problems occur once I am at home I am to contact my physician.  I understand and acknowledge receipt of the instructions indicated above.    _____________________________________________                                                       Physician's or R.N.'s Signature                Date/Time                        _____________________________________________                                                       Patient or Representative Signature         Date/Time        Activity Instructions   You must avoid lifting more than *** pounds until your physician instructs you differently. You should avoid {d/c avoid/resume:120111}. You may resume {d/c avoid/resume:120111}.   Personal Items   Please collect all clothing which belongs to you from your nurse. Please collect any valuables you stored during your stay from the front desk, and please remember all of your personal items, such as dentures, canes, and eyeglasses.   Scheduled Meds:   Continuous Infusions:   PRN Meds:    Patient Discharge   [name] / [id number] DOB: December 26, 1965   Admitted 10/01/2011 Discharged: 10/05/2011   Scheduled Meds:   Continuous Infusions:   PRN Meds:    Personal Items   Please collect all clothing which belongs to you from your nurse. Please collect any valuables you stored during your stay from the front desk, and please remember all of your personal items, such as dentures, canes, and eyeglasses.   Activity Instructions   You must avoid lifting more than *** pounds until your physician instructs you differently. You should avoid {d/c avoid/resume:120111}. You may resume {d/c avoid/resume:120111}.   I understand that if any problems occur once I am at home I am to contact my physician.  I understand and acknowledge receipt of the instructions indicated above.    _____________________________________________                                                         Physician's or R.N.'s Signature                Date/Time                        _____________________________________________                                                       Patient or Representative Signature         Date/Time

## 2011-10-05 NOTE — ED Provider Notes (Signed)
Please see my h and p. History/physical exam/procedure(s) were performed by non-physician practitioner and as supervising physician I was immediately available for consultation/collaboration. I have reviewed all notes and am in agreement with care and plan.   Hilario Quarry, MD 10/05/11 [phone]

## 2011-10-06 LAB — CULTURE, BLOOD (ROUTINE X 2): Culture  Setup Time: 201305030846

## 2011-10-06 NOTE — Discharge Summary (Signed)
   This is an addendum to the discharge summary dictated by me on 10/03/2011.  Patient was discharged on Ceftin for a total 14 days of antibiotics for left-sided pyelonephritis.  Blood culture came back positive for Escherichia coli after patient left the hospital.  Escherichia coli susceptible for Ceftin, duration of antibiotics will not be change.  I'll notify her oncologist as she goes for for her medical needs, and she has appointment tomorrow.  Clint Lipps Pager: [phone] 10/06/2011, 2:44 PM

## 2011-10-07 ENCOUNTER — Other Ambulatory Visit: Payer: Self-pay | Admitting: *Deleted

## 2011-10-07 ENCOUNTER — Other Ambulatory Visit (HOSPITAL_BASED_OUTPATIENT_CLINIC_OR_DEPARTMENT_OTHER): Payer: Medicaid Other | Admitting: Lab

## 2011-10-07 ENCOUNTER — Encounter: Payer: Self-pay | Admitting: Physician Assistant

## 2011-10-07 ENCOUNTER — Ambulatory Visit (HOSPITAL_BASED_OUTPATIENT_CLINIC_OR_DEPARTMENT_OTHER): Payer: Medicaid Other | Admitting: Physician Assistant

## 2011-10-07 VITALS — BP 126/78 | HR 94 | Temp 98.1°F | Ht 62.0 in | Wt 111.4 lb

## 2011-10-07 DIAGNOSIS — Z5189 Encounter for other specified aftercare: Secondary | ICD-10-CM

## 2011-10-07 DIAGNOSIS — C787 Secondary malignant neoplasm of liver and intrahepatic bile duct: Secondary | ICD-10-CM

## 2011-10-07 DIAGNOSIS — I10 Essential (primary) hypertension: Secondary | ICD-10-CM

## 2011-10-07 DIAGNOSIS — K219 Gastro-esophageal reflux disease without esophagitis: Secondary | ICD-10-CM

## 2011-10-07 DIAGNOSIS — C7951 Secondary malignant neoplasm of bone: Secondary | ICD-10-CM

## 2011-10-07 DIAGNOSIS — Z170 Estrogen receptor positive status [ER+]: Secondary | ICD-10-CM

## 2011-10-07 DIAGNOSIS — C801 Malignant (primary) neoplasm, unspecified: Secondary | ICD-10-CM

## 2011-10-07 DIAGNOSIS — C50519 Malignant neoplasm of lower-outer quadrant of unspecified female breast: Secondary | ICD-10-CM

## 2011-10-07 DIAGNOSIS — Z5111 Encounter for antineoplastic chemotherapy: Secondary | ICD-10-CM

## 2011-10-07 LAB — BASIC METABOLIC PANEL
BUN: 19 mg/dL (ref 6–23)
Chloride: 103 mEq/L (ref 96–112)
Potassium: 4.1 mEq/L (ref 3.5–5.3)

## 2011-10-07 LAB — CULTURE, BLOOD (ROUTINE X 2)

## 2011-10-07 MED ORDER — FULVESTRANT 250 MG/5ML IM SOLN
500.0000 mg | Freq: Once | INTRAMUSCULAR | Status: AC
  Administered 2011-10-07: 500 mg via INTRAMUSCULAR
  Filled 2011-10-07: qty 10

## 2011-10-07 MED ORDER — OMEPRAZOLE 20 MG PO CPDR: 20.0000 mg | DELAYED_RELEASE_CAPSULE | Freq: Every day | ORAL | Status: DC

## 2011-10-07 MED ORDER — OXYCODONE-ACETAMINOPHEN 5-325 MG PO TABS: 1.0000 | ORAL_TABLET | Freq: Four times a day (QID) | ORAL | Status: DC | PRN

## 2011-10-07 MED ORDER — AMLODIPINE BESYLATE 5 MG PO TABS: 5.0000 mg | ORAL_TABLET | Freq: Every day | ORAL | Status: DC

## 2011-10-07 NOTE — Progress Notes (Signed)
ID: [name]   DOB: 1966-02-01  MR#: [id number]  WUJ#:[id number]  HISTORY OF PRESENT ILLNESS: The patient delivered a child on 05/17/2009, after which she felt fullness in the right breast. She eventually brought this to her doctors attention and was scheduled for mammography and ultrasound on 03/08/2009. There were no prior exams for comparison. Mammography showed the breast to be dense, with a 5 cm obscured mass in the outer midportion of the right breast. Mass was easily palpable, and there was also a mass in the outer right periareolar region. They were suspicious microcalcifications in the outer midportion of the right breast, as well as within the mass itself. Ultrasound showed an extensive heterogeneous area, corresponding to the palpable abnormality with multiple solid and cystic foci. Overall, the mass measured in excess of 5 cm, in addition to cysts in the breast.  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 The tumors were ER +100%, PR +80%.  Bilateral breast MRI was obtained 03/22/2009. There was a large area of abnormal enhancement in the lower outer quadrant of the right breast measuring up to 9.7 cm. No abnormal appearing lymph nodes and no other masses in either breast.  Patient underwent definitive right mastectomy and sentinel lymph node sampling on 04/29/2009 under the care of Dr. Derrell Lolling. 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 Repeat prognostic panel showed tumor to be ER +93% and PR +51%. HER-2/neu was negative with a ratio of 0.71, with proliferation marker of 36%.  Patient was evaluated by Dr. Darnelle Catalan in January 2011 but failed to keep appointments until she was seen here again in July 2011. In July, she began adjuvant chemotherapy. She received 4 cycles of dose dense  doxorubicin and cyclophosphamide followed by 4 weekly doses of paclitaxel which was discontinued in November 2011 secondary to peripheral neuropathy. The patient then received radiation therapy to the right chest wall which was truncated due to problems with missing appointments.  Patient was started on tamoxifen briefly in June of 2012 which was discontinued due to nausea. The nausea continued, and an abdominal CT in October 2012 confirmed a growth in liver lesions. These were biopsied on 03/10/2011 and showed metastatic adenocarcinoma, still ER positive at 100%, PR +5%. Her subsequent treatments are as detailed below  INTERVAL HISTORY: [name] returns today with her daughter and her grandson for followup of [name]'s metastatic breast cancer. She continues to receive Faslodex-year-old monthly basis, due for next injection today. She receives Zoladex every 3 months, not due again until July. Zoledronic acid is on hold secondary to some dental issues.  Interval history is remarkable for her [name] having recently been hospitalized, discharged on 10/03/2011, for pyelonephritis on the left side. She continues on antibiotics, but is feeling much better. She still has a little back pain in the left. No additional dysuria and no hematuria. No fevers, chills, or night sweats.  [name] had CTs of the abdomen and pelvis, as well as a chest x-ray, during her recent hospitalization. These confirmed stable to slightly improved metastatic lesions of the liver. Chest x-ray was unremarkable.   Interval history is also remarkable for [name] having had 2 teeth extracted since her last visit here in April. She still has another dental visit scheduled in the next few weeks for some additional work.  REVIEW OF SYSTEMS: She continues to complain of pain,  primarily in the upper extremities and the back. The pain is well-controlled with oxycodone/APAP, 5/325 mg. (She averages 4 tablets daily. She was given a prescription for  20 tablets in the hospital on 10/03/2011 requests a refill today which is appropriate.)  [name] denies any fevers, chills, or night sweats. She's had no nausea and no change in bowel habits. No abnormal bleeding. Specifically, no vaginal bleeding. No increased hot flashes.  No chest pain or increased shortness of breath. No abnormal headaches or dizziness. No peripheral swelling.  A detailed review of systems is otherwise noncontributory.   PAST MEDICAL HISTORY: Past Medical History  Diagnosis Date  . Hypertension   . 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 03/21/2011  . GERD (gastroesophageal reflux disease)   . Breast cancer   . 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 Pyelonephritis 10/01/11  . Pneumonia     "once"  . Recurrent UTI (urinary tract infection) 10/01/11    PAST SURGICAL HISTORY: Past Surgical History  Procedure Date  . Cesarean section 1989; 2010  . Mastectomy 2011    right  . Back surgery 2012    "removed tumor,cancer, from my spine  . Port-a-cath removal 2012    right chest  . Portacath placement 2011    right  . Tubal ligation 2010  . Breast biopsy 2011    right    FAMILY HISTORY The patient's father is alive at age 25.  The patient's mother is alive at age 18.  She has four sisters.  She had one brother who died from colon cancer at the age of 41.  There is no breast or ovarian cancer history in the family to her knowledge.  GYNECOLOGIC HISTORY: She is GX P2.  First pregnancy to term at age 7.  Last menstrual period began January 9th.  Her periods usually are regular, once a month, last four days, of which two are heavy days. She never took oral contraceptives.  SOCIAL HISTORY: The patient is not employed.  She lives by herself with her baby whose name is [name].  Her first child, [name], is 49 and is studying business in this area.  The patient has one grandchild.  She attends the [employer].    ADVANCED DIRECTIVES:  HEALTH  MAINTENANCE: History  Substance Use Topics  . Smoking status: Current Everyday Smoker -- 0.2 packs/day for 31 years    Types: Cigarettes  . Smokeless tobacco: Never Used  . Alcohol Use: No     Colonoscopy:  PAP:  Bone density:  Lipid panel:  No Known Allergies  Current Outpatient Prescriptions  Medication Sig Dispense Refill  . amLODipine (NORVASC) 5 MG tablet Take 1 tablet (5 mg total) by mouth daily.  30 tablet  3  . cefUROXime (CEFTIN) 500 MG tablet Take 1 tablet (500 mg total) by mouth 2 (two) times daily.  24 tablet  0  . cyclobenzaprine (FLEXERIL) 10 MG tablet Take 10 mg by mouth 3 (three) times daily as needed. For muscle spasm      . desloratadine-pseudoephedrine (CLARINEX-D 24-HOUR) 5-240 MG per 24 hr tablet Take 1 tablet by mouth daily.      . fulvestrant (FASLODEX) 250 MG/5ML injection Inject 500 mg into the muscle every 30 (thirty) days. One injection each buttock over 1-2 minutes. Warm prior to use.       [name] omeprazole (PRILOSEC) 20 MG capsule Take 1 capsule (20 mg total) by mouth daily.  30 capsule  5  .  oxyCODONE-acetaminophen (PERCOCET) 5-325 MG per tablet Take 1 tablet by mouth every 6 (six) hours as needed. For pain  120 tablet  0  . promethazine (PHENERGAN) 25 MG tablet Take 12.5-25 mg by mouth every 6 (six) hours as needed.      . sucralfate (CARAFATE) 1 G tablet Take 1 g by mouth 4 (four) times daily.      . Zoledronic Acid (ZOMETA IV) Inject 4 mg into the vein. Last given in Nov 2012      . DISCONTD: Alum & Mag Hydroxide-Simeth (MAGIC MOUTHWASH W/LIDOCAINE) SOLN Take 5 mLs by mouth 3 (three) times daily as needed (take 15 minutes prior to meals.).  480 mL  0   Current Facility-Administered Medications  Medication Dose Route Frequency Provider Last Rate Last Dose  . fulvestrant (FASLODEX) injection 500 mg  500 mg Intramuscular Once Lowella Dell, MD   500 mg at 10/07/11 1406    OBJECTIVE: Middle-aged [place] American woman who appears comfortable Filed  Vitals:   10/07/11 1328  BP: 126/78  Pulse: 94  Temp: 98.1 F (36.7 C)     Body mass index is 20.38 kg/(m^2).    ECOG FS:1 Physical Exam: HEENT: Sclerae anicteric, conjunctivae pink.  Oropharynx clear.  Poor dentition.  No mucositis or candidiasis.   Nodes:  No cervical, supraclavicular, or axillary lymphadenopathy palpated.  Breast Exam:  Deferred Lungs:  Clear to auscultation bilaterally.  No crackles, rhonchi, or wheezes.   Heart:  Regular rate and rhythm.   Abdomen:  Soft, thin, nontender.  Positive bowel sounds.  No organomegaly or masses palpated.   Musculoskeletal:  No focal spinal tenderness to gentle palpation. CVA tenderness on left, nontender on right. Extremities:  Benign.  No peripheral edema or cyanosis.   Skin:  Benign.   Neuro:  Nonfocal. Alert and oriented x3.    LAB RESULTS: Lab Results  Component Value Date   WBC 7.0 10/03/2011   NEUTROABS 9.2* 10/02/2011   HGB 11.2* 10/03/2011   HCT 33.2* 10/03/2011   MCV 80.6 10/03/2011   PLT 212 10/03/2011      Chemistry      Component Value Date/Time   NA 141 10/03/2011 0600   K 3.4* 10/03/2011 0600   CL 108 10/03/2011 0600   CO2 23 10/03/2011 0600   BUN 6 10/03/2011 0600   CREATININE 0.74 10/03/2011 0600      Component Value Date/Time   CALCIUM 9.4 10/03/2011 0600   ALKPHOS 141* 10/02/2011 0619   AST 55* 10/02/2011 0619   ALT 52* 10/02/2011 0619   BILITOT 0.7 10/02/2011 0619       Lab Results  Component Value Date   LABCA2 21 09/09/2011    STUDIES: No results found.  ASSESSMENT:   46 year old [place] woman     1. Status post right mastectomy and axillary lymph node dissection November 2010 for a T3 N1(mic) Stage III invasive ductal carcinoma, grade 3, estrogen and progesterone receptor positive, HER-2/neu negative, with MIB-1 of 36%.    2.Status post adjuvant chemotherapy July to November 2011, consisting of 4 cycles of dose dense doxorubicin and cyclophosphamide, followed by 4 of 12 planned weekly doses of paclitaxel given  adjuvantly, discontinued secondary to peripheral neuropathy.    3. Status post radiation to the right chest wall, truncated due to problems with the patient missing appointments.   4. Tamoxifen started June of 2012, but discontinued due to nausea.    5. metastatic disease to liver and bone pathologically documented October of  2012, estrogen receptor 100% positive, progesterone receptor 7% positive, with no HER-2 amplification;  treatment in the metastatic setting has consisted of:   6.Faslodex since 03/16/2011, Zoladex every 3 months also started on 03/16/2011, and monthly zoledronic acid started November 2011   7. Status post thoracic laminectomy, T9-T11, for impending cord compression on 03/21/2011, followed by irradiation to the spine completed November 2011    PLAN:  Kenedee will receive her Faslodex injections today as scheduled.  She will continue to receive Zoladex every 3 months, due for her next injection in July. We will continue to hold her zoledronic acid for now in light of her recent extractions. We will reassess in a couple of months when she returns to see Dr. Darnelle Catalan in early July, and decide whether to resume zoledronic acid at that time.  Otherwise, I have refilled Gali is oxycodone/APAP, amlodipine, and omeprazole and she will continue on all of her current medications. I will see her again in early June when she returns for Faslodex. She voices her understanding and agreement with our plan, and will call with any changes or problems.   Briget Shaheed    10/07/2011

## 2011-10-08 ENCOUNTER — Telehealth: Payer: Self-pay | Admitting: *Deleted

## 2011-10-08 ENCOUNTER — Encounter: Payer: Self-pay | Admitting: Oncology

## 2011-10-08 NOTE — Telephone Encounter (Signed)
add on lab and ab and injection for 11-04-2011 lab md and 1 hour zometa on 12-02-2011 sent michelle email to set up treatment on 12-02-2011

## 2011-10-08 NOTE — Progress Notes (Signed)
Patient received three prescriptions from [facility] pharmacy on 10/07/11 $9.00,her remaninig balance [facility] $104.25 and [facility] $533.58.

## 2011-11-04 ENCOUNTER — Other Ambulatory Visit (HOSPITAL_BASED_OUTPATIENT_CLINIC_OR_DEPARTMENT_OTHER): Payer: Medicaid Other | Admitting: Lab

## 2011-11-04 ENCOUNTER — Encounter: Payer: Self-pay | Admitting: Physician Assistant

## 2011-11-04 ENCOUNTER — Ambulatory Visit (HOSPITAL_BASED_OUTPATIENT_CLINIC_OR_DEPARTMENT_OTHER): Payer: Medicaid Other | Admitting: Physician Assistant

## 2011-11-04 ENCOUNTER — Ambulatory Visit (HOSPITAL_BASED_OUTPATIENT_CLINIC_OR_DEPARTMENT_OTHER): Payer: Medicaid Other

## 2011-11-04 VITALS — BP 144/85 | HR 74 | Temp 97.8°F

## 2011-11-04 VITALS — BP 148/90 | HR 72 | Temp 98.5°F | Ht 62.0 in | Wt 109.4 lb

## 2011-11-04 DIAGNOSIS — C50919 Malignant neoplasm of unspecified site of unspecified female breast: Secondary | ICD-10-CM

## 2011-11-04 DIAGNOSIS — C50519 Malignant neoplasm of lower-outer quadrant of unspecified female breast: Secondary | ICD-10-CM

## 2011-11-04 DIAGNOSIS — C7951 Secondary malignant neoplasm of bone: Secondary | ICD-10-CM

## 2011-11-04 DIAGNOSIS — C787 Secondary malignant neoplasm of liver and intrahepatic bile duct: Secondary | ICD-10-CM

## 2011-11-04 DIAGNOSIS — G620 Drug-induced polyneuropathy: Secondary | ICD-10-CM

## 2011-11-04 DIAGNOSIS — R52 Pain, unspecified: Secondary | ICD-10-CM

## 2011-11-04 DIAGNOSIS — Z5111 Encounter for antineoplastic chemotherapy: Secondary | ICD-10-CM

## 2011-11-04 LAB — CBC WITH DIFFERENTIAL/PLATELET
BASO%: 0.7 % (ref 0.0–2.0)
LYMPH%: 30.5 % (ref 14.0–49.7)
MCHC: 33.3 g/dL (ref 31.5–36.0)
MONO#: 0.4 10*3/uL (ref 0.1–0.9)
RBC: 4.72 10*6/uL (ref 3.70–5.45)
WBC: 5 10*3/uL (ref 3.9–10.3)
lymph#: 1.5 10*3/uL (ref 0.9–3.3)
nRBC: 0 % (ref 0–0)

## 2011-11-04 LAB — COMPREHENSIVE METABOLIC PANEL
ALT: 13 U/L (ref 0–35)
AST: 21 U/L (ref 0–37)
Calcium: 10.1 mg/dL (ref 8.4–10.5)
Chloride: 104 mEq/L (ref 96–112)
Creatinine, Ser: 0.93 mg/dL (ref 0.50–1.10)
Potassium: 3.2 mEq/L — ABNORMAL LOW (ref 3.5–5.3)

## 2011-11-04 MED ORDER — OXYCODONE-ACETAMINOPHEN 5-325 MG PO TABS: 1.0000 | ORAL_TABLET | Freq: Four times a day (QID) | ORAL | Status: DC | PRN

## 2011-11-04 MED ORDER — FULVESTRANT 250 MG/5ML IM SOLN
500.0000 mg | Freq: Once | INTRAMUSCULAR | Status: AC
  Administered 2011-11-04: 500 mg via INTRAMUSCULAR
  Filled 2011-11-04: qty 10

## 2011-11-04 NOTE — Progress Notes (Signed)
ID: [name]   DOB: 11/17/1965  MR#: [id number]  NWG#:[id number]  HISTORY OF PRESENT ILLNESS: The patient delivered a child on 05/17/2009, after which she felt fullness in the right breast. She eventually brought this to her doctors attention and was scheduled for mammography and ultrasound on 03/08/2009. There were no prior exams for comparison. Mammography showed the breast to be dense, with a 5 cm obscured mass in the outer midportion of the right breast. Mass was easily palpable, and there was also a mass in the outer right periareolar region. They were suspicious microcalcifications in the outer midportion of the right breast, as well as within the mass itself. Ultrasound showed an extensive heterogeneous area, corresponding to the palpable abnormality with multiple solid and cystic foci. Overall, the mass measured in excess of 5 cm, in addition to cysts in the breast.  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 The tumors were ER +100%, PR +80%.  Bilateral breast MRI was obtained 03/22/2009. There was a large area of abnormal enhancement in the lower outer quadrant of the right breast measuring up to 9.7 cm. No abnormal appearing lymph nodes and no other masses in either breast.  Patient underwent definitive right mastectomy and sentinel lymph node sampling on 04/29/2009 under the care of Dr. Derrell Lolling. 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 Repeat prognostic panel showed tumor to be ER +93% and PR +51%. HER-2/neu was negative with a ratio of 0.71, with proliferation marker of 36%.  Patient was evaluated by Dr. Darnelle Catalan in January 2011 but failed to keep appointments until she was seen here again in July 2011. In July, she began adjuvant chemotherapy. She received 4 cycles of dose dense  doxorubicin and cyclophosphamide followed by 4 weekly doses of paclitaxel which was discontinued in November 2011 secondary to peripheral neuropathy. The patient then received radiation therapy to the right chest wall which was truncated due to problems with missing appointments.  Patient was started on tamoxifen briefly in June of 2012 which was discontinued due to nausea. The nausea continued, and an abdominal CT in October 2012 confirmed a growth in liver lesions. These were biopsied on 03/10/2011 and showed metastatic adenocarcinoma, still ER positive at 100%, PR +5%. Her subsequent treatments are as detailed below  INTERVAL HISTORY: [name] returns today with her daughter and her grandson for followup of [name]'s metastatic breast cancer. She continues to receive Faslodex on a monthly basis, due for next injection today. She receives Zoladex every 3 months, not due again until July. Zoledronic acid is on hold secondary to some dental issues and extractions.  [name] has one more dental appointment coming up this month.  [name] had some bilateral leg pain for approximately one week this past month. It has now resolved, and she denies any additional new or unusual pain elsewhere. Otherwise  [name] tells me "everything is okay" and interval history is unremarkable.  REVIEW OF SYSTEMS: She continues to complain of pain, primarily in the upper extremities and the back. The pain is well-controlled with oxycodone/APAP, 5/325 mg. (She averages 4 tablets daily and requests a refill today which is appropriate.)  [name] denies any fevers, chills, or night sweats. She's had no nausea.  She had several episodes of diarrhea for approximately 12 hours one day last week. She actually had one episode of the bowel incontinence during  that period of time. This resolved, and she's had no additional change in bowel habits sense. No blood or mucus in the stool. No abdominal cramping. No abnormal bleeding.  Specifically, no vaginal bleeding. No increased hot flashes.  No chest pain or increased shortness of breath. No abnormal headaches or dizziness. No peripheral swelling.  A detailed review of systems is otherwise noncontributory.   PAST MEDICAL HISTORY: Past Medical History  Diagnosis Date  . Hypertension   . 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 03/21/2011  . GERD (gastroesophageal reflux disease)   . Breast cancer   . 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 Pyelonephritis 10/01/11  . Pneumonia     "once"  . Recurrent UTI (urinary tract infection) 10/01/11    PAST SURGICAL HISTORY: Past Surgical History  Procedure Date  . Cesarean section 1989; 2010  . Mastectomy 2011    right  . Back surgery 2012    "removed tumor,cancer, from my spine  . Port-a-cath removal 2012    right chest  . Portacath placement 2011    right  . Tubal ligation 2010  . Breast biopsy 2011    right    FAMILY HISTORY The patient's father is alive at age 68.  The patient's mother is alive at age 79.  She has four sisters.  She had one brother who died from colon cancer at the age of 21.  There is no breast or ovarian cancer history in the family to her knowledge.  GYNECOLOGIC HISTORY: She is GX P2.  First pregnancy to term at age 39.  Last menstrual period began January 9th.  Her periods usually are regular, once a month, last four days, of which two are heavy days. She never took oral contraceptives.  SOCIAL HISTORY: The patient is not employed.  She lives by herself with her baby whose name is [name], [name].  Her first child, [name], is 57 and is studying business in this area.  The patient has one grandchild.  She attends the [employer].    ADVANCED DIRECTIVES:  HEALTH MAINTENANCE: History  Substance Use Topics  . Smoking status: Current Everyday Smoker -- 0.2 packs/day for 31 years    Types: Cigarettes  . Smokeless tobacco: Never Used  . Alcohol Use: No      Colonoscopy:  PAP:  Bone density:  Lipid panel:  No Known Allergies  Current Outpatient Prescriptions  Medication Sig Dispense Refill  . amLODipine (NORVASC) 5 MG tablet Take 1 tablet (5 mg total) by mouth daily.  30 tablet  3  . cyclobenzaprine (FLEXERIL) 10 MG tablet Take 10 mg by mouth 3 (three) times daily as needed. For muscle spasm      . desloratadine-pseudoephedrine (CLARINEX-D 24-HOUR) 5-240 MG per 24 hr tablet Take 1 tablet by mouth daily.      . fulvestrant (FASLODEX) 250 MG/5ML injection Inject 500 mg into the muscle every 30 (thirty) days. One injection each buttock over 1-2 minutes. Warm prior to use.       [name] omeprazole (PRILOSEC) 20 MG capsule Take 1 capsule (20 mg total) by mouth daily.  30 capsule  5  . oxyCODONE-acetaminophen (PERCOCET) 5-325 MG per tablet Take 1 tablet by mouth every 6 (six) hours as needed. For pain  120 tablet  0  . promethazine (PHENERGAN) 25 MG tablet Take 12.5-25 mg by mouth every 6 (six) hours as needed.      . sucralfate (CARAFATE) 1 G tablet Take 1 g  by mouth 4 (four) times daily.      . Zoledronic Acid (ZOMETA IV) Inject 4 mg into the vein. Last given in Nov 2012      . DISCONTD: Alum & Mag Hydroxide-Simeth (MAGIC MOUTHWASH W/LIDOCAINE) SOLN Take 5 mLs by mouth 3 (three) times daily as needed (take 15 minutes prior to meals.).  480 mL  0   No current facility-administered medications for this visit.   Facility-Administered Medications Ordered in Other Visits  Medication Dose Route Frequency Provider Last Rate Last Dose  . fulvestrant (FASLODEX) injection 500 mg  500 mg Intramuscular Once Lowella Dell, MD   500 mg at 11/04/11 1324    OBJECTIVE: Middle-aged [place] American woman who appears comfortable Filed Vitals:   11/04/11 1336  BP: 148/90  Pulse: 72  Temp: 98.5 F (36.9 C)     Body mass index is 20.01 kg/(m^2).    ECOG FS:1 Physical Exam: HEENT: Sclerae anicteric, conjunctivae pink.  Oropharynx clear.  Poor dentition.   No mucositis or candidiasis.   Nodes:  No cervical, supraclavicular, or axillary lymphadenopathy palpated.  Breast Exam:  Patient is status post right mastectomy with well-healed incision. Some hyperpigmentation is present, but no suspicious skin changes. No nodularity or evidence of local recurrence the chest wall. Left breast is benign no masses, skin changes, or nipple inversion. Lungs:  Clear to auscultation bilaterally.  No crackles, rhonchi, or wheezes.   Heart:  Regular rate and rhythm.   Abdomen:  Soft, thin, nontender.  Positive bowel sounds.  No organomegaly or masses palpated.   Musculoskeletal:  No focal spinal tenderness to gentle palpation. No CVA tenderness. Extremities:  Benign.  No peripheral edema or cyanosis.   Skin:  Benign.   Neuro:  Nonfocal. Alert and oriented x3.    LAB RESULTS: Lab Results  Component Value Date   WBC 5.0 11/04/2011   NEUTROABS 3.0 11/04/2011   HGB 13.5 11/04/2011   HCT 40.5 11/04/2011   MCV 85.8 11/04/2011   PLT 282 11/04/2011      Chemistry      Component Value Date/Time   NA 141 10/07/2011 1304   K 4.1 10/07/2011 1304   CL 103 10/07/2011 1304   CO2 30 10/07/2011 1304   BUN 19 10/07/2011 1304   CREATININE 0.78 10/07/2011 1304      Component Value Date/Time   CALCIUM 9.4 10/07/2011 1304   ALKPHOS 141* 10/02/2011 0619   AST 55* 10/02/2011 0619   ALT 52* 10/02/2011 0619   BILITOT 0.7 10/02/2011 0619       Lab Results  Component Value Date   LABCA2 21 09/09/2011    STUDIES: No results found.  ASSESSMENT:   46 year old [place] woman     1. Status post right mastectomy and axillary lymph node dissection November 2010 for a T3 N1(mic) Stage III invasive ductal carcinoma, grade 3, estrogen and progesterone receptor positive, HER-2/neu negative, with MIB-1 of 36%.    2.Status post adjuvant chemotherapy July to November 2011, consisting of 4 cycles of dose dense doxorubicin and cyclophosphamide, followed by 4 of 12 planned weekly doses of paclitaxel given  adjuvantly, discontinued secondary to peripheral neuropathy.    3. Status post radiation to the right chest wall, truncated due to problems with the patient missing appointments.   4. Tamoxifen started June of 2012, but discontinued due to nausea.    5. metastatic disease to liver and bone pathologically documented October of 2012, estrogen receptor 100% positive, progesterone receptor 7% positive, with  no HER-2 amplification;  treatment in the metastatic setting has consisted of:   6.Faslodex since 03/16/2011, Zoladex every 3 months also started on 03/16/2011, and monthly zoledronic acid started November 2011   7. Status post thoracic laminectomy, T9-T11, for impending cord compression on 03/21/2011, followed by irradiation to the spine completed November 2011    PLAN:  [name] will receive her Faslodex injections today as scheduled.  She will continue to receive Zoladex every 3 months, due for her next injection in July. We will continue to hold her zoledronic acid for now in light of her recent extractions. We will reassess when she returns to see Dr. Darnelle Catalan in early July, and decide whether to resume zoledronic acid at that time.  Otherwise, I have refilled Latasia is oxycodone/APAP and she will continue on all of her current medications.  She voices her understanding and agreement with our plan, and will call with any changes or problems.   Crescent Gotham    11/04/2011

## 2011-11-06 ENCOUNTER — Other Ambulatory Visit: Payer: Self-pay | Admitting: *Deleted

## 2011-11-06 DIAGNOSIS — E871 Hypo-osmolality and hyponatremia: Secondary | ICD-10-CM

## 2011-11-06 MED ORDER — POTASSIUM CHLORIDE CRYS ER 20 MEQ PO TBCR: 20.0000 meq | EXTENDED_RELEASE_TABLET | Freq: Every day | ORAL | Status: DC

## 2011-11-09 ENCOUNTER — Encounter: Payer: Self-pay | Admitting: Oncology

## 2011-11-09 NOTE — Progress Notes (Signed)
Patient received one prescription from [facility] on 11/04/11 $3.00,her remaninig balance [facility] $101.25 and [facility] $533.58.

## 2011-11-12 ENCOUNTER — Encounter: Payer: Self-pay | Admitting: *Deleted

## 2011-11-12 NOTE — Progress Notes (Signed)
HAVE ATTEMPTED TO NOTIFY PT, PER MD, TO TAKE K+ DAILY. PT # IS NOT IN SERVICE

## 2011-11-25 ENCOUNTER — Telehealth: Payer: Self-pay | Admitting: *Deleted

## 2011-11-25 NOTE — Telephone Encounter (Signed)
Message left by pt stating " I need a note from the doctor so I can attend rehab classes Monday thru Friday ".  Pt did not leave a return call number. Number per caller ID noted as

## 2011-12-02 ENCOUNTER — Ambulatory Visit (HOSPITAL_BASED_OUTPATIENT_CLINIC_OR_DEPARTMENT_OTHER): Payer: Medicaid Other | Admitting: Oncology

## 2011-12-02 ENCOUNTER — Ambulatory Visit: Payer: Medicaid Other

## 2011-12-02 ENCOUNTER — Other Ambulatory Visit: Payer: Medicaid Other | Admitting: Lab

## 2011-12-02 VITALS — BP 174/99 | HR 74 | Temp 98.3°F | Ht 62.0 in | Wt 111.4 lb

## 2011-12-02 DIAGNOSIS — C7951 Secondary malignant neoplasm of bone: Secondary | ICD-10-CM

## 2011-12-02 DIAGNOSIS — C50519 Malignant neoplasm of lower-outer quadrant of unspecified female breast: Secondary | ICD-10-CM

## 2011-12-02 DIAGNOSIS — C787 Secondary malignant neoplasm of liver and intrahepatic bile duct: Secondary | ICD-10-CM

## 2011-12-02 DIAGNOSIS — I10 Essential (primary) hypertension: Secondary | ICD-10-CM

## 2011-12-02 DIAGNOSIS — K219 Gastro-esophageal reflux disease without esophagitis: Secondary | ICD-10-CM

## 2011-12-02 DIAGNOSIS — Z170 Estrogen receptor positive status [ER+]: Secondary | ICD-10-CM

## 2011-12-02 MED ORDER — FULVESTRANT 250 MG/5ML IM SOLN
500.0000 mg | Freq: Once | INTRAMUSCULAR | Status: DC
  Filled 2011-12-02: qty 10

## 2011-12-02 MED ORDER — OMEPRAZOLE 20 MG PO CPDR: 20.0000 mg | DELAYED_RELEASE_CAPSULE | Freq: Every day | ORAL | Status: DC

## 2011-12-02 MED ORDER — OXYCODONE-ACETAMINOPHEN 5-325 MG PO TABS: 1.0000 | ORAL_TABLET | Freq: Four times a day (QID) | ORAL | Status: DC | PRN

## 2011-12-02 MED ORDER — GOSERELIN ACETATE 10.8 MG ~~LOC~~ IMPL
10.8000 mg | DRUG_IMPLANT | Freq: Once | SUBCUTANEOUS | Status: DC
  Filled 2011-12-02: qty 10.8

## 2011-12-02 MED ORDER — AMLODIPINE BESYLATE 5 MG PO TABS: 5.0000 mg | ORAL_TABLET | Freq: Every day | ORAL | Status: DC

## 2011-12-02 NOTE — Progress Notes (Signed)
ID: [name]   DOB: Apr 11, 1966  MR#: [id number]  CSN#:[id number]  HISTORY OF PRESENT ILLNESS: The patient delivered a child on 05/17/2009, after which she felt fullness in the right breast. She eventually brought this to her doctors attention and was scheduled for mammography and ultrasound on 03/08/2009. There were no prior exams for comparison. Mammography showed the breast to be dense, with a 5 cm obscured mass in the outer midportion of the right breast. Mass was easily palpable, and there was also a mass in the outer right periareolar region. They were suspicious microcalcifications in the outer midportion of the right breast, as well as within the mass itself. Ultrasound showed an extensive heterogeneous area, corresponding to the palpable abnormality with multiple solid and cystic foci. Overall, the mass measured in excess of 5 cm, in addition to cysts in the breast.  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 The tumors were ER +100%, PR +80%.  Bilateral breast MRI was obtained 03/22/2009. There was a large area of abnormal enhancement in the lower outer quadrant of the right breast measuring up to 9.7 cm. No abnormal appearing lymph nodes and no other masses in either breast.  Patient underwent definitive right mastectomy and sentinel lymph node sampling on 04/29/2009 under the care of Dr. Derrell Lolling. 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 Repeat prognostic panel showed tumor to be ER +93% and PR +51%. HER-2/neu was negative with a ratio of 0.71, with proliferation marker of 36%.  Patient was evaluated by Dr. Darnelle Catalan in January 2011 but failed to keep appointments until she was seen here again in July 2011. In July, she began adjuvant chemotherapy. She received 4 cycles of dose dense  doxorubicin and cyclophosphamide followed by 4 weekly doses of paclitaxel which was discontinued in November 2011 secondary to peripheral neuropathy. The patient then received radiation therapy to the right chest wall which was truncated due to problems with missing appointments.  Patient was started on tamoxifen briefly in June of 2012 which was discontinued due to nausea. The nausea continued, and an abdominal CT in October 2012 confirmed a growth in liver lesions. These were biopsied on 03/10/2011 and showed metastatic adenocarcinoma, still ER positive at 100%, PR +5%. Her subsequent treatments are as detailed below  INTERVAL HISTORY: Zacari returns today with her daughter, small son, and grandson. The interval history is unremarkable. She tells me she's run out of all her medications and wanted them refilled today, which we were glad to do. Most of the time she is at home by herself. She is not exercising regularly "but I'm active, out and about all the time".  REVIEW OF SYSTEMS: She complains of back pain, which is not new, not more intense or frequent than previously. Percocet helps with this, and she takes it about 4 times daily. She takes ibuprofen as well. She found tramadol was not helpful. She has some weakness in the right leg at times, and it tends to give way, but there have been no falls, and currently she tells me her leg is "fine". There have been no unusual headaches, visual changes, nausea, vomiting, cough, phlegm production, pleurisy, chest pain or pressure, or change in bowel or bladder habits. A detailed review of systems was otherwise noncontributory.  PAST MEDICAL HISTORY: Past Medical History  Diagnosis Date  . Hypertension   .  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 03/21/2011  . GERD (gastroesophageal reflux disease)   . Breast cancer   . 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 Pyelonephritis 10/01/11  . Pneumonia     "once"  . Recurrent UTI (urinary tract  infection) 10/01/11    PAST SURGICAL HISTORY: Past Surgical History  Procedure Date  . Cesarean section 1989; 2010  . Mastectomy 2011    right  . Back surgery 2012    "removed tumor,cancer, from my spine  . Port-a-cath removal 2012    right chest  . Portacath placement 2011    right  . Tubal ligation 2010  . Breast biopsy 2011    right    FAMILY HISTORY The patient's father is alive at age 61.  The patient's mother is alive at age 43.  She has four sisters.  She had one brother who died from colon cancer at the age of 44.  There is no breast or ovarian cancer history in the family to her knowledge.  GYNECOLOGIC HISTORY: She is GX P2.  First pregnancy to term at age 89.  Last menstrual period began January 9th.  Her periods usually are regular, once a month, last four days, of which two are heavy days. She never took oral contraceptives.  SOCIAL HISTORY: The patient is not employed.  She lives by herself with her small son whose name is Engineer, technical sales.  Her first child, [name], is studying business in this area.  The patient has one grandchild.  She attends the [employer].    ADVANCED DIRECTIVES:  HEALTH MAINTENANCE: History  Substance Use Topics  . Smoking status: Current Everyday Smoker -- 0.2 packs/day for 31 years    Types: Cigarettes  . Smokeless tobacco: Never Used  . Alcohol Use: No     Colonoscopy:  PAP:  Bone density:  Lipid panel:  No Known Allergies  Current Outpatient Prescriptions  Medication Sig Dispense Refill  . amLODipine (NORVASC) 5 MG tablet Take 1 tablet (5 mg total) by mouth daily.  30 tablet  3  . cyclobenzaprine (FLEXERIL) 10 MG tablet Take 10 mg by mouth 3 (three) times daily as needed. For muscle spasm      . desloratadine-pseudoephedrine (CLARINEX-D 24-HOUR) 5-240 MG per 24 hr tablet Take 1 tablet by mouth daily.      . fulvestrant (FASLODEX) 250 MG/5ML injection Inject 500 mg into the muscle every 30 (thirty) days. One injection each  buttock over 1-2 minutes. Warm prior to use.       [name] omeprazole (PRILOSEC) 20 MG capsule Take 1 capsule (20 mg total) by mouth daily.  30 capsule  5  . oxyCODONE-acetaminophen (PERCOCET) 5-325 MG per tablet Take 1 tablet by mouth every 6 (six) hours as needed. For pain  120 tablet  0  . potassium chloride SA (K-DUR,KLOR-CON) 20 MEQ tablet Take 1 tablet (20 mEq total) by mouth daily.  30 tablet  1  . promethazine (PHENERGAN) 25 MG tablet Take 12.5-25 mg by mouth every 6 (six) hours as needed.      . sucralfate (CARAFATE) 1 G tablet Take 1 g by mouth 4 (four) times daily.      . Zoledronic Acid (ZOMETA IV) Inject 4 mg into the vein. Last given in Nov 2012      . DISCONTD: Alum & Mag Hydroxide-Simeth (MAGIC MOUTHWASH W/LIDOCAINE) SOLN Take 5 mLs by mouth 3 (three) times daily as needed (take 15 minutes prior to meals.).  480 mL  0    OBJECTIVE: Middle-aged [place] American woman who appears nervous Filed Vitals:   12/02/11 1235  BP: 174/99  Pulse: 74  Temp: 98.3 F (36.8 C)     Body mass index is 20.38 kg/(m^2).    ECOG FS:1   Sclerae unicteric Oropharynx clear No cervical or supraclavicular adenopathy Lungs no rales or rhonchi Heart regular rate and rhythm Abd benign MSK no focal spinal tenderness, no peripheral edema Neuro: nonfocal Breasts: The right breast is status post mastectomy. There is no evidence of local recurrence. The right axilla shows no masses. The left breast is unremarkable.  LAB RESULTS: Lab Results  Component Value Date   WBC 5.0 11/04/2011   NEUTROABS 3.0 11/04/2011   HGB 13.5 11/04/2011   HCT 40.5 11/04/2011   MCV 85.8 11/04/2011   PLT 282 11/04/2011      Chemistry      Component Value Date/Time   NA 143 11/04/2011 1308   K 3.2* 11/04/2011 1308   CL 104 11/04/2011 1308   CO2 31 11/04/2011 1308   BUN 14 11/04/2011 1308   CREATININE 0.93 11/04/2011 1308      Component Value Date/Time   CALCIUM 10.1 11/04/2011 1308   ALKPHOS 143* 11/04/2011 1308   AST 21 11/04/2011 1308   ALT  13 11/04/2011 1308   BILITOT 0.3 11/04/2011 1308       Lab Results  Component Value Date   LABCA2 21 09/09/2011    STUDIES: No results found.  ASSESSMENT:   46 y.o.  [place] woman    1. 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    2.Status post adjuvant chemotherapy July to November 2011, consisting of 4 cycles of dose dense doxorubicin and cyclophosphamide, followed by 4 of 12 planned weekly doses of paclitaxel given adjuvantly, discontinued secondary to peripheral neuropathy.    3. Status post radiation to the right chest wall, truncated due to problems with the patient missing appointments.   4. Tamoxifen started June of 2012, but discontinued due to nausea.    5. metastatic disease to liver and bone pathologically documented October of 2012, estrogen receptor 100% positive, progesterone receptor 7% positive, with no HER-2 amplification;  treatment in the metastatic setting has consisted of:   6.Faslodex since 03/16/2011, Zoladex every 3 months also started on 03/16/2011, and monthly zoledronic acid started November 2011   7. Status post thoracic laminectomy, T9-T11, for impending cord compression on 03/21/2011, followed by irradiation to the spine completed November 2011    PLAN:  We are continuing the monthly Faslodex injections, and the every 3 months goserelin. Before she returns to see [place] late Sept/early Oct she will have a repeat Chest Ct scan to follow measurable disease in the spine and liver. Clinically she is doing well. Today I refilled her regular medications and also wrote her a script for 120 oxycodone/APAP. She knows to call for any problems that may develop before the next visit.  MAGRINAT,GUSTAV C    12/02/2011

## 2011-12-30 ENCOUNTER — Other Ambulatory Visit: Payer: Self-pay | Admitting: *Deleted

## 2011-12-30 ENCOUNTER — Other Ambulatory Visit (HOSPITAL_BASED_OUTPATIENT_CLINIC_OR_DEPARTMENT_OTHER): Payer: Medicaid Other | Admitting: Lab

## 2011-12-30 ENCOUNTER — Ambulatory Visit (HOSPITAL_BASED_OUTPATIENT_CLINIC_OR_DEPARTMENT_OTHER): Payer: Medicaid Other

## 2011-12-30 ENCOUNTER — Ambulatory Visit: Payer: Medicaid Other

## 2011-12-30 VITALS — BP 148/90 | HR 76 | Temp 98.4°F

## 2011-12-30 DIAGNOSIS — C50519 Malignant neoplasm of lower-outer quadrant of unspecified female breast: Secondary | ICD-10-CM

## 2011-12-30 DIAGNOSIS — C7951 Secondary malignant neoplasm of bone: Secondary | ICD-10-CM

## 2011-12-30 DIAGNOSIS — Z5111 Encounter for antineoplastic chemotherapy: Secondary | ICD-10-CM

## 2011-12-30 DIAGNOSIS — I10 Essential (primary) hypertension: Secondary | ICD-10-CM

## 2011-12-30 DIAGNOSIS — C787 Secondary malignant neoplasm of liver and intrahepatic bile duct: Secondary | ICD-10-CM

## 2011-12-30 DIAGNOSIS — C50919 Malignant neoplasm of unspecified site of unspecified female breast: Secondary | ICD-10-CM

## 2011-12-30 DIAGNOSIS — K219 Gastro-esophageal reflux disease without esophagitis: Secondary | ICD-10-CM

## 2011-12-30 LAB — CBC WITH DIFFERENTIAL/PLATELET
BASO%: 0.2 % (ref 0.0–2.0)
EOS%: 1.4 % (ref 0.0–7.0)
LYMPH%: 33.1 % (ref 14.0–49.7)
MCH: 29.2 pg (ref 25.1–34.0)
MCHC: 35.1 g/dL (ref 31.5–36.0)
MCV: 83.1 fL (ref 79.5–101.0)
MONO%: 6.6 % (ref 0.0–14.0)
Platelets: 248 10*3/uL (ref 145–400)
RBC: 4.21 10*6/uL (ref 3.70–5.45)
RDW: 14.4 % (ref 11.2–14.5)
nRBC: 0 % (ref 0–0)

## 2011-12-30 LAB — COMPREHENSIVE METABOLIC PANEL
ALT: 14 U/L (ref 0–35)
AST: 24 U/L (ref 0–37)
Alkaline Phosphatase: 152 U/L — ABNORMAL HIGH (ref 39–117)
CO2: 27 mEq/L (ref 19–32)
Creatinine, Ser: 1.09 mg/dL (ref 0.50–1.10)
Sodium: 139 mEq/L (ref 135–145)
Total Bilirubin: 0.4 mg/dL (ref 0.3–1.2)

## 2011-12-30 MED ORDER — FULVESTRANT 250 MG/5ML IM SOLN
500.0000 mg | Freq: Once | INTRAMUSCULAR | Status: AC
  Administered 2011-12-30: 500 mg via INTRAMUSCULAR
  Filled 2011-12-30: qty 10

## 2011-12-30 MED ORDER — OXYCODONE-ACETAMINOPHEN 5-325 MG PO TABS: 1.0000 | ORAL_TABLET | Freq: Four times a day (QID) | ORAL | Status: DC | PRN

## 2011-12-30 MED ORDER — CYCLOBENZAPRINE HCL 10 MG PO TABS: 10.0000 mg | ORAL_TABLET | Freq: Three times a day (TID) | ORAL | Status: DC | PRN

## 2011-12-30 MED ORDER — AMLODIPINE BESYLATE 5 MG PO TABS: 5.0000 mg | ORAL_TABLET | Freq: Every day | ORAL | Status: DC

## 2012-01-26 ENCOUNTER — Other Ambulatory Visit: Payer: Self-pay | Admitting: *Deleted

## 2012-01-27 ENCOUNTER — Ambulatory Visit (HOSPITAL_BASED_OUTPATIENT_CLINIC_OR_DEPARTMENT_OTHER): Payer: Medicaid Other

## 2012-01-27 ENCOUNTER — Other Ambulatory Visit: Payer: Self-pay | Admitting: *Deleted

## 2012-01-27 ENCOUNTER — Other Ambulatory Visit: Payer: Medicaid Other

## 2012-01-27 VITALS — BP 140/89 | HR 80 | Temp 98.1°F

## 2012-01-27 DIAGNOSIS — K219 Gastro-esophageal reflux disease without esophagitis: Secondary | ICD-10-CM

## 2012-01-27 DIAGNOSIS — Z5111 Encounter for antineoplastic chemotherapy: Secondary | ICD-10-CM

## 2012-01-27 DIAGNOSIS — C50919 Malignant neoplasm of unspecified site of unspecified female breast: Secondary | ICD-10-CM

## 2012-01-27 DIAGNOSIS — C787 Secondary malignant neoplasm of liver and intrahepatic bile duct: Secondary | ICD-10-CM

## 2012-01-27 DIAGNOSIS — C50519 Malignant neoplasm of lower-outer quadrant of unspecified female breast: Secondary | ICD-10-CM

## 2012-01-27 DIAGNOSIS — C7951 Secondary malignant neoplasm of bone: Secondary | ICD-10-CM

## 2012-01-27 DIAGNOSIS — I10 Essential (primary) hypertension: Secondary | ICD-10-CM

## 2012-01-27 MED ORDER — AMLODIPINE BESYLATE 5 MG PO TABS: 5.0000 mg | ORAL_TABLET | Freq: Every day | ORAL | Status: DC

## 2012-01-27 MED ORDER — OXYCODONE-ACETAMINOPHEN 5-325 MG PO TABS: 1.0000 | ORAL_TABLET | Freq: Four times a day (QID) | ORAL | Status: DC | PRN

## 2012-01-27 MED ORDER — FULVESTRANT 250 MG/5ML IM SOLN
500.0000 mg | Freq: Once | INTRAMUSCULAR | Status: AC
  Administered 2012-01-27: 500 mg via INTRAMUSCULAR
  Filled 2012-01-27: qty 10

## 2012-01-27 MED ORDER — CYCLOBENZAPRINE HCL 10 MG PO TABS: 10.0000 mg | ORAL_TABLET | Freq: Three times a day (TID) | ORAL | Status: DC | PRN

## 2012-02-02 ENCOUNTER — Emergency Department (HOSPITAL_COMMUNITY): Payer: Medicaid Other

## 2012-02-02 ENCOUNTER — Encounter (HOSPITAL_COMMUNITY): Payer: Self-pay | Admitting: Family Medicine

## 2012-02-02 ENCOUNTER — Emergency Department (HOSPITAL_COMMUNITY)
Admission: EM | Admit: 2012-02-02 | Discharge: 2012-02-02 | Disposition: A | Discharge status: Home / Self Care | Payer: Medicaid Other | Attending: Emergency Medicine | Admitting: Emergency Medicine

## 2012-02-02 DIAGNOSIS — M898X5 Other specified disorders of bone, thigh: Secondary | ICD-10-CM

## 2012-02-02 DIAGNOSIS — C7951 Secondary malignant neoplasm of bone: Secondary | ICD-10-CM | POA: Insufficient documentation

## 2012-02-02 DIAGNOSIS — C50919 Malignant neoplasm of unspecified site of unspecified female breast: Secondary | ICD-10-CM | POA: Insufficient documentation

## 2012-02-02 DIAGNOSIS — K219 Gastro-esophageal reflux disease without esophagitis: Secondary | ICD-10-CM | POA: Insufficient documentation

## 2012-02-02 DIAGNOSIS — M79609 Pain in unspecified limb: Secondary | ICD-10-CM | POA: Insufficient documentation

## 2012-02-02 DIAGNOSIS — I10 Essential (primary) hypertension: Secondary | ICD-10-CM | POA: Insufficient documentation

## 2012-02-02 DIAGNOSIS — F17200 Nicotine dependence, unspecified, uncomplicated: Secondary | ICD-10-CM | POA: Insufficient documentation

## 2012-02-02 DIAGNOSIS — M25559 Pain in unspecified hip: Secondary | ICD-10-CM | POA: Insufficient documentation

## 2012-02-02 DIAGNOSIS — C787 Secondary malignant neoplasm of liver and intrahepatic bile duct: Secondary | ICD-10-CM | POA: Insufficient documentation

## 2012-02-02 MED ORDER — IBUPROFEN 800 MG PO TABS
800.0000 mg | ORAL_TABLET | Freq: Once | ORAL | Status: AC
  Administered 2012-02-02: 800 mg via ORAL
  Filled 2012-02-02: qty 1

## 2012-02-02 MED ORDER — OXYCODONE-ACETAMINOPHEN 5-325 MG PO TABS: 2.0000 | ORAL_TABLET | ORAL | Status: DC | PRN

## 2012-02-02 MED ORDER — OXYCODONE-ACETAMINOPHEN 5-325 MG PO TABS
1.0000 | ORAL_TABLET | Freq: Once | ORAL | Status: AC
  Administered 2012-02-02: 1 via ORAL
  Filled 2012-02-02: qty 1

## 2012-02-02 NOTE — ED Notes (Signed)
Pt reports right leg pain x 1 1/2 weeks. Reports taking advil and it helps some but pain comes back.  Reports pain worse when walking or if it gets stiff when she is sitting too long.

## 2012-02-03 ENCOUNTER — Telehealth: Payer: Self-pay | Admitting: *Deleted

## 2012-02-03 NOTE — Telephone Encounter (Signed)
Message left by pt requesting a return call due to " I had to go the ER last night "- " this is an emergency " Return call number given as [phone].  This RN called pt and obtained VM- message left requesting pt to return call including if she felt situation warranted need to speak directly to RN and this RN not available she should tell the operator and request to speak to triage RN.  Note pt went to ER for leg pain -  xrays obtained and pt was informed results stating "areas of abnormality maybe cancer"  Of note areas of abnormality was present on prior films in May of 2013-  Pt has known met disease of which liver involvement is followed for measurement of response to therapy.  Will await return call to inquire of pt's " emergent " concern.

## 2012-02-03 NOTE — Telephone Encounter (Signed)
Alaynah left second message stating " I saw that I missed your call- I was sleeping " ( no mention of VM left by this RN).  This RN made 2nd attempt to reach pt without success and left message again on VM requesting return call and if need is urgent to request a triage nurse- not to go into RN's vm.

## 2012-02-04 ENCOUNTER — Other Ambulatory Visit: Payer: Self-pay | Admitting: *Deleted

## 2012-02-04 DIAGNOSIS — K219 Gastro-esophageal reflux disease without esophagitis: Secondary | ICD-10-CM

## 2012-02-04 DIAGNOSIS — I10 Essential (primary) hypertension: Secondary | ICD-10-CM

## 2012-02-04 DIAGNOSIS — C787 Secondary malignant neoplasm of liver and intrahepatic bile duct: Secondary | ICD-10-CM

## 2012-02-04 MED ORDER — OXYCODONE-ACETAMINOPHEN 5-325 MG PO TABS: 2.0000 | ORAL_TABLET | ORAL | Status: DC | PRN

## 2012-02-04 MED ORDER — OMEPRAZOLE 20 MG PO CPDR: 20.0000 mg | DELAYED_RELEASE_CAPSULE | Freq: Every day | ORAL | Status: DC

## 2012-02-06 NOTE — ED Provider Notes (Signed)
Medical screening examination/treatment/procedure(s) were performed by non-physician practitioner and as supervising physician I was immediately available for consultation/collaboration.  Jesika Men, MD 02/06/12 1743 

## 2012-02-06 NOTE — ED Provider Notes (Signed)
History     CSN: [id number]  Arrival date & time 02/02/12  1638   First MD Initiated Contact with Patient 02/02/12 1903      Chief Complaint  Patient presents with  . Leg Pain    (Consider location/radiation/quality/duration/timing/severity/associated sxs/prior treatment) Patient is a 46 y.o. female presenting with leg pain. The history is provided by the patient and a relative. No language interpreter was used.  Leg Pain  The incident occurred more than 1 week ago. The incident occurred at home. There was no injury mechanism. The pain is present in the right hip and right leg. The quality of the pain is described as throbbing. The pain is at a severity of 8/10. The pain has been constant since onset. Pertinent negatives include no numbness, no inability to bear weight, no loss of motion, no loss of sensation and no tingling. She reports no foreign bodies present. The symptoms are aggravated by bearing weight. She has tried NSAIDs for the symptoms. The treatment provided mild relief.  46 yo female with R hip/leg pain x > 1 week. pmh breast cancer with mets.  Prior CT with suspicious area to the head of femur.  Goes to Dr. Tami Lin and out of percocet at home for pain.  Taking advil with no relief.  Pain is worse when she is ambulating after sitting for a long time especially.  No fever, n/v.  pmh hypertension, gerd, breast ca with bone mets pyelonephritis, uti's.  Past Medical History  Diagnosis Date  . Hypertension   . 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 03/21/2011  . GERD (gastroesophageal reflux disease)   . Breast cancer   . 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 Pyelonephritis 10/01/11  . Pneumonia     "once"  . Recurrent UTI (urinary tract infection) 10/01/11    Past Surgical History  Procedure Date  . Cesarean section 1989; 2010  . Mastectomy 2011    right  . Back surgery 2012    "removed tumor,cancer, from my spine  . Port-a-cath removal 2012    right chest    . Portacath placement 2011    right  . Tubal ligation 2010  . Breast biopsy 2011    right    History reviewed. No pertinent family history.  History  Substance Use Topics  . Smoking status: Current Everyday Smoker -- 0.2 packs/day for 31 years    Types: Cigarettes  . Smokeless tobacco: Never Used  . Alcohol Use: No    OB History    Grav Para Term Preterm Abortions TAB SAB Ect Mult Living                  Review of Systems  Constitutional: Negative.   HENT: Negative.   Eyes: Negative.   Respiratory: Negative.   Cardiovascular: Negative.   Gastrointestinal: Negative.   Musculoskeletal:       R hip pain  Neurological: Negative.  Negative for tingling and numbness.  Psychiatric/Behavioral: Negative.   All other systems reviewed and are negative.    Allergies  Review of patient's allergies indicates no known allergies.  Home Medications   Current Outpatient Rx  Name Route Sig Dispense Refill  . AMLODIPINE BESYLATE 5 MG PO TABS Oral Take 5 mg by mouth daily.    . CYCLOBENZAPRINE HCL 10 MG PO TABS Oral Take 10 mg by mouth 3 (three) times daily as needed. For muscle spasms    . DESLORATADINE-PSEUDOEPHED ER 5-240 MG PO  TB24 Oral Take 1 tablet by mouth daily.    [name] OMEPRAZOLE 20 MG PO CPDR Oral Take 1 capsule (20 mg total) by mouth daily. 30 capsule 5  . OXYCODONE-ACETAMINOPHEN 5-325 MG PO TABS Oral Take 2 tablets by mouth every 4 (four) hours as needed for pain. 60 tablet 0    BP 149/66  Pulse 76  Temp 98.4 F (36.9 C) (Oral)  Resp 18  SpO2 99%  LMP 03/02/2011  Physical Exam  Nursing note and vitals reviewed. Constitutional: She is oriented to person, place, and time. She appears well-developed and well-nourished.  HENT:  Head: Normocephalic and atraumatic.  Eyes: Conjunctivae and EOM are normal. Pupils are equal, round, and reactive to light.  Neck: Normal range of motion. Neck supple.  Cardiovascular: Normal rate.   Pulmonary/Chest: Effort normal.   Abdominal: Soft.  Musculoskeletal: Normal range of motion. She exhibits tenderness. She exhibits no edema.       R hip pain with ambulation  Neurological: She is alert and oriented to person, place, and time. She has normal reflexes.  Skin: Skin is warm and dry.  Psychiatric: She has a normal mood and affect.    ED Course  Procedures (including critical care time)  Labs Reviewed - No data to display No results found.   1. Hip pain   2. Pain in femur       MDM  R hip pain with pmh breast ca /mets to bone.  Suspicious R hip films to head of femur reviewed by myself. Percocet for pain with relief.  Will followup with Dr. Arlice Colt tomorrow.Rx for percocet for pain.  Return if fever, n/v.          Remi Haggard, NP 02/06/12 [phone]

## 2012-02-15 ENCOUNTER — Encounter (HOSPITAL_COMMUNITY): Payer: Self-pay | Admitting: Emergency Medicine

## 2012-02-15 ENCOUNTER — Emergency Department (HOSPITAL_COMMUNITY)
Admission: EM | Admit: 2012-02-15 | Discharge: 2012-02-15 | Disposition: A | Discharge status: Home / Self Care | Payer: Medicaid Other | Attending: Emergency Medicine | Admitting: Emergency Medicine

## 2012-02-15 ENCOUNTER — Emergency Department (HOSPITAL_COMMUNITY): Payer: Medicaid Other

## 2012-02-15 ENCOUNTER — Telehealth: Payer: Self-pay | Admitting: *Deleted

## 2012-02-15 DIAGNOSIS — Z79899 Other long term (current) drug therapy: Secondary | ICD-10-CM | POA: Insufficient documentation

## 2012-02-15 DIAGNOSIS — R51 Headache: Secondary | ICD-10-CM

## 2012-02-15 DIAGNOSIS — Z853 Personal history of malignant neoplasm of breast: Secondary | ICD-10-CM | POA: Insufficient documentation

## 2012-02-15 DIAGNOSIS — R079 Chest pain, unspecified: Secondary | ICD-10-CM | POA: Insufficient documentation

## 2012-02-15 DIAGNOSIS — N390 Urinary tract infection, site not specified: Secondary | ICD-10-CM | POA: Insufficient documentation

## 2012-02-15 DIAGNOSIS — I10 Essential (primary) hypertension: Secondary | ICD-10-CM | POA: Insufficient documentation

## 2012-02-15 DIAGNOSIS — H532 Diplopia: Secondary | ICD-10-CM

## 2012-02-15 LAB — URINALYSIS, ROUTINE W REFLEX MICROSCOPIC
Glucose, UA: NEGATIVE mg/dL
Hgb urine dipstick: NEGATIVE
Ketones, ur: NEGATIVE mg/dL
pH: 7 (ref 5.0–8.0)

## 2012-02-15 LAB — POCT I-STAT, CHEM 8
Calcium, Ion: 1.27 mmol/L — ABNORMAL HIGH (ref 1.12–1.23)
HCT: 42 % (ref 36.0–46.0)
Hemoglobin: 14.3 g/dL (ref 12.0–15.0)
TCO2: 25 mmol/L (ref 0–100)

## 2012-02-15 LAB — URINE MICROSCOPIC-ADD ON

## 2012-02-15 LAB — CBC WITH DIFFERENTIAL/PLATELET
Basophils Absolute: 0 10*3/uL (ref 0.0–0.1)
Basophils Relative: 0 % (ref 0–1)
Eosinophils Absolute: 0 10*3/uL (ref 0.0–0.7)
Eosinophils Relative: 1 % (ref 0–5)
HCT: 39.3 % (ref 36.0–46.0)
MCHC: 33.8 g/dL (ref 30.0–36.0)
MCV: 84.7 fL (ref 78.0–100.0)
Monocytes Absolute: 0.3 10*3/uL (ref 0.1–1.0)
RDW: 14.1 % (ref 11.5–15.5)

## 2012-02-15 MED ORDER — SODIUM CHLORIDE 0.9 % IV BOLUS (SEPSIS)
1000.0000 mL | Freq: Once | INTRAVENOUS | Status: AC
  Administered 2012-02-15: 1000 mL via INTRAVENOUS

## 2012-02-15 MED ORDER — ONDANSETRON HCL 4 MG/2ML IJ SOLN
4.0000 mg | Freq: Once | INTRAMUSCULAR | Status: AC
  Administered 2012-02-15: 4 mg via INTRAVENOUS
  Filled 2012-02-15: qty 2

## 2012-02-15 MED ORDER — SODIUM CHLORIDE 0.9 % IV SOLN
INTRAVENOUS | Status: DC
  Administered 2012-02-15: 16:00:00 via INTRAVENOUS

## 2012-02-15 MED ORDER — IOHEXOL 300 MG/ML  SOLN
100.0000 mL | Freq: Once | INTRAMUSCULAR | Status: AC | PRN
  Administered 2012-02-15: 100 mL via INTRAVENOUS

## 2012-02-15 MED ORDER — OXYCODONE-ACETAMINOPHEN 5-325 MG PO TABS: 2.0000 | ORAL_TABLET | ORAL | Status: DC | PRN

## 2012-02-15 MED ORDER — CEPHALEXIN 500 MG PO CAPS: 500.0000 mg | ORAL_CAPSULE | Freq: Four times a day (QID) | ORAL | Status: DC

## 2012-02-15 MED ORDER — MORPHINE SULFATE 4 MG/ML IJ SOLN
4.0000 mg | Freq: Once | INTRAMUSCULAR | Status: AC
  Administered 2012-02-15: 4 mg via INTRAVENOUS
  Filled 2012-02-15: qty 1

## 2012-02-15 MED ORDER — OXYCODONE-ACETAMINOPHEN 5-325 MG PO TABS: 1.0000 | ORAL_TABLET | ORAL | Status: DC | PRN

## 2012-02-15 NOTE — Telephone Encounter (Signed)
Call into triage today from patient.  Patient complaining she has had bad headaches for several weeks and now with blurred/double vision. States she is currently in the ER waiting to be seen, but wants to know if she needs to stay in ER, or "just come over there to your office". Informed patient that we have no means of Radiology diagnostic testing here, and she should wait to be evaluated by ER physician for possible CT/MRI of head. Informed patient that the doctors there will be in touch with [place] if needed, and that I would notify Dr Darnelle Catalan and his staff of her call.

## 2012-02-15 NOTE — ED Provider Notes (Signed)
History     CSN: [id number]  Arrival date & time 02/15/12  1016   First MD Initiated Contact with Patient 02/15/12 1224      Chief Complaint  Patient presents with  . Migraine    (Consider location/radiation/quality/duration/timing/severity/associated sxs/prior treatment) HPI Comments: [name] is a 46 y.o. Female who has had persistent headache for 10 days, with intermittent double vision, "in my left eye". She also has dysuria, and urinary frequency. She denies nausea, or vomiting. She is using her usual medications including Percocet, with some relief. When taking the Percocet. She is currently out of her Percocet, which she takes for chronic pain secondary to her metastatic breast cancer. She's not had a fever, neck pain, back pain, weakness, or dizziness.   Patient is a 46 y.o. female presenting with migraines. The history is provided by the patient.  Migraine    Past Medical History  Diagnosis Date  . Hypertension   . 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 03/21/2011  . GERD (gastroesophageal reflux disease)   . Breast cancer   . 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 Pyelonephritis 10/01/11  . Pneumonia     "once"  . Recurrent UTI (urinary tract infection) 10/01/11    Past Surgical History  Procedure Date  . Cesarean section 1989; 2010  . Mastectomy 2011    right  . Back surgery 2012    "removed tumor,cancer, from my spine  . Port-a-cath removal 2012    right chest  . Portacath placement 2011    right  . Tubal ligation 2010  . Breast biopsy 2011    right    History reviewed. No pertinent family history.  History  Substance Use Topics  . Smoking status: Current Every Day Smoker -- 0.2 packs/day for 31 years    Types: Cigarettes  . Smokeless tobacco: Never Used  . Alcohol Use: No    OB History    Grav Para Term Preterm Abortions TAB SAB Ect Mult Living                  Review of Systems  All other systems reviewed and are  negative.    Allergies  Review of patient's allergies indicates no known allergies.  Home Medications   Current Outpatient Rx  Name Route Sig Dispense Refill  . AMLODIPINE BESYLATE 5 MG PO TABS Oral Take 5 mg by mouth daily.    . CYCLOBENZAPRINE HCL 10 MG PO TABS Oral Take 10 mg by mouth 3 (three) times daily as needed. For muscle spasms    . DESLORATADINE-PSEUDOEPHED ER 5-240 MG PO TB24 Oral Take 1 tablet by mouth daily.    . ADULT MULTIVITAMIN W/MINERALS CH Oral Take 1 tablet by mouth daily.    [name] OMEPRAZOLE 20 MG PO CPDR Oral Take 20 mg by mouth daily.    . CEPHALEXIN 500 MG PO CAPS Oral Take 1 capsule (500 mg total) by mouth 4 (four) times daily. 28 capsule 0  . OXYCODONE-ACETAMINOPHEN 5-325 MG PO TABS Oral Take 1 tablet by mouth every 4 (four) hours as needed. Pain    . OXYCODONE-ACETAMINOPHEN 5-325 MG PO TABS Oral Take 2 tablets by mouth every 4 (four) hours as needed for pain. 15 tablet 0    BP 130/75  Pulse 78  Temp 98.5 F (36.9 C) (Oral)  Resp 18  Ht 5\' 2"  (1.575 m)  Wt 117 lb (53.071 kg)  BMI 21.40 kg/m2  SpO2 100%  LMP 03/02/2011  Physical Exam  Nursing note and vitals reviewed. Constitutional: She is oriented to person, place, and time. She appears well-developed and well-nourished.  HENT:  Head: Normocephalic and atraumatic.  Eyes: Conjunctivae normal and EOM are normal. Pupils are equal, round, and reactive to light.  Neck: Normal range of motion and phonation normal. Neck supple.  Cardiovascular: Normal rate, regular rhythm and intact distal pulses.   Pulmonary/Chest: Effort normal and breath sounds normal. She exhibits no tenderness.  Abdominal: Soft. She exhibits no distension. There is no tenderness. There is no guarding.  Musculoskeletal: Normal range of motion.  Neurological: She is alert and oriented to person, place, and time. She has normal strength. She exhibits normal muscle tone.  Skin: Skin is warm and dry.  Psychiatric: She has a normal mood  and affect. Her behavior is normal. Judgment and thought content normal.    ED Course  Procedures (including critical care time)  Emergency department treatment: IV fluids, IV, morphine, and Zofran   Reevaluation: 16:00- she is calm, and comfortable. She has no further complaints. She continues to start, that her diplopia is not persistent.      Labs Reviewed  URINALYSIS, ROUTINE W REFLEX MICROSCOPIC - Abnormal; Notable for the following:    Leukocytes, UA TRACE (*)     All other components within normal limits  POCT I-STAT, CHEM 8 - Abnormal; Notable for the following:    Calcium, Ion 1.27 (*)     All other components within normal limits  URINE MICROSCOPIC-ADD ON - Abnormal; Notable for the following:    Squamous Epithelial / LPF FEW (*)     Bacteria, UA FEW (*)     All other components within normal limits  CBC WITH DIFFERENTIAL  URINE CULTURE   Dg Chest 2 View  02/15/2012  *RADIOLOGY REPORT*  Clinical Data: Chest pain headache  CHEST - 2 VIEW  Comparison: 10/01/2011  Findings: Right mastectomy changes.  Pulmonary hyperinflation is present compatible with chronic lung disease.  Negative for infiltrate or effusion.  No mass lesion or heart failure.  Nipple shadow on the left again noted. Sclerotic lesions in the thoracic spine compatible with metastatic disease.  IMPRESSION: No acute abnormality.   Original Report Authenticated By: Camelia Phenes, M.D.    Ct Head W Wo Contrast  02/15/2012  *RADIOLOGY REPORT*  Clinical Data: Headaches.  History of breast cancer.  CT HEAD WITHOUT AND WITH CONTRAST  Technique:  Contiguous axial images were obtained from the base of the skull through the vertex without and with intravenous contrast.  Contrast: OMNIPAQUE IOHEXOL 300 MG/ML  SOLN  Comparison: None.  Findings: The ventricles are normal.  No extra-axial fluid collections are seen.  The brainstem and cerebellum are unremarkable.  No acute intracranial findings such as infarction or  hemorrhage.  No mass lesions.  The bony calvarium is intact.  The visualized paranasal sinuses and mastoid air cells are clear.  IMPRESSION: No CT findings for acute intracranial abnormality or metastatic disease.   Original Report Authenticated By: P. Loralie Champagne, M.D.      1. Headache   2. UTI (lower urinary tract infection)   3. Diplopia       MDM  Nonspecific headache, with intermittent hypokalemia. No evidence for intracranial space occupying lesion. Doubt meningitis, metabolic instability, or occult infection.   Plan: Home Medications- Percocet; Home Treatments- rest; Recommended follow up- PCP 1 week     Flint Melter, MD 02/15/12 1704

## 2012-02-15 NOTE — ED Notes (Signed)
Patient states she has been having headaches for a week and a half.  Patient denies fevers, N/V.  Patient states she started seeing double in her left eye (intermittently).

## 2012-02-16 ENCOUNTER — Other Ambulatory Visit: Payer: Self-pay | Admitting: *Deleted

## 2012-02-17 LAB — URINE CULTURE

## 2012-02-18 ENCOUNTER — Other Ambulatory Visit: Payer: Self-pay | Admitting: *Deleted

## 2012-02-18 ENCOUNTER — Ambulatory Visit (HOSPITAL_COMMUNITY): Payer: Medicaid Other

## 2012-02-18 DIAGNOSIS — C787 Secondary malignant neoplasm of liver and intrahepatic bile duct: Secondary | ICD-10-CM

## 2012-02-18 MED ORDER — SUMATRIPTAN SUCCINATE 50 MG PO TABS: 50.0000 mg | ORAL_TABLET | ORAL | Status: DC | PRN

## 2012-02-18 MED ORDER — SUMATRIPTAN SUCCINATE 50 MG PO TABS: 50.0000 mg | ORAL_TABLET | ORAL | Status: DC

## 2012-02-18 NOTE — Telephone Encounter (Signed)
Pt called to this RN stating ongoing headache not relieved with use of percocet. She also states " I can't see out of my left eye ". Note pt very tearful during the discussion stating " I just need to know what to do "  This RN discussed with pt above symptoms relating to either a migraine or her known met ca. Plan is to obtain MRI ( noted negative CT on Monday ) for evaluation but also migraine med will be called in now to pharmacy.  Mri scheduled for 8pm tonight ( next available due to machines down at [facility] ).  Discussed all the above with Erryn including reinterating with her that our goal is to take care of her.  Ellarose verbalized understanding.  Attempted to put pt on the schedule for appt tomorrow but system would not process due to need for additional referrals per [place] access. This RN left message on Darlena's VM per above.

## 2012-02-19 ENCOUNTER — Ambulatory Visit: Payer: Medicaid Other | Admitting: Physician Assistant

## 2012-02-19 ENCOUNTER — Other Ambulatory Visit: Payer: Medicaid Other | Admitting: Lab

## 2012-02-19 ENCOUNTER — Telehealth: Payer: Self-pay | Admitting: Medical Oncology

## 2012-02-19 NOTE — Progress Notes (Signed)
FTKA today.  See phone note from desk nurse dated 02/19/2012.  Zollie Scale, PA-C 02/19/2012

## 2012-02-19 NOTE — Telephone Encounter (Signed)
Patient LMOVM stating that she missed her appointment for her MRI on 9/19 and wanted to reschedule.  Spoke with patient regarding appointment and also inquired as to her missed appointment with Zollie Scale on 9/20.  "Nobody told me I had this appointment."  Informed patient that I would speak with radiology to reschedule appointment.  Asked patient if headaches better, patient states they are.  Patient asked that we call her at [phone] as her phone does not get good reception, advised patient that I would make note of it.  MRI rescheduled to Saturday 9/21 @ 2p @ [facility] radiology.  Patient to arrive 15 minutes prior to appointment time.  Attempted to contact patient at above number given, this is a neighbors phone and informed that patient was down the street.  Neighbor requested to take message for patient, notified neighbor of above appointment and instructions, patient to call if another appointment needed.  No questions at this time.

## 2012-02-20 ENCOUNTER — Ambulatory Visit (HOSPITAL_COMMUNITY)
Admission: RE | Admit: 2012-02-20 | Discharge: 2012-02-20 | Disposition: A | Discharge status: Home / Self Care | Payer: Medicaid Other | Source: Ambulatory Visit | Attending: Oncology | Admitting: Oncology

## 2012-02-20 DIAGNOSIS — H532 Diplopia: Secondary | ICD-10-CM | POA: Insufficient documentation

## 2012-02-20 DIAGNOSIS — R030 Elevated blood-pressure reading, without diagnosis of hypertension: Secondary | ICD-10-CM | POA: Insufficient documentation

## 2012-02-20 DIAGNOSIS — M899 Disorder of bone, unspecified: Secondary | ICD-10-CM | POA: Insufficient documentation

## 2012-02-20 DIAGNOSIS — M949 Disorder of cartilage, unspecified: Secondary | ICD-10-CM | POA: Insufficient documentation

## 2012-02-20 DIAGNOSIS — C787 Secondary malignant neoplasm of liver and intrahepatic bile duct: Secondary | ICD-10-CM | POA: Insufficient documentation

## 2012-02-20 DIAGNOSIS — R51 Headache: Secondary | ICD-10-CM | POA: Insufficient documentation

## 2012-02-20 DIAGNOSIS — C50919 Malignant neoplasm of unspecified site of unspecified female breast: Secondary | ICD-10-CM | POA: Insufficient documentation

## 2012-02-20 MED ORDER — GADOBENATE DIMEGLUMINE 529 MG/ML IV SOLN
10.0000 mL | Freq: Once | INTRAVENOUS | Status: AC | PRN
  Administered 2012-02-20: 10 mL via INTRAVENOUS

## 2012-02-21 ENCOUNTER — Other Ambulatory Visit: Payer: Self-pay | Admitting: Hematology & Oncology

## 2012-02-21 ENCOUNTER — Telehealth: Payer: Self-pay | Admitting: Hematology & Oncology

## 2012-02-21 NOTE — Telephone Encounter (Signed)
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  I tried several times to get hold of her, but no luck.  I told her that she will need XRT to the brain and that Dr. Darrall Dears PA will set this up for her.  I told her to call us if she has any problems, but that we would call her on Monday to get the radiation going.  Andrea E.

## 2012-02-22 ENCOUNTER — Telehealth: Payer: Self-pay | Admitting: *Deleted

## 2012-02-22 MED ORDER — DEXAMETHASONE 4 MG PO TABS: 4.0000 mg | ORAL_TABLET | Freq: Three times a day (TID) | ORAL | Status: DC

## 2012-02-22 NOTE — Telephone Encounter (Signed)
This RN spoke with pt per result of MRI of brain- discussed results and needed therapy including starting steroids today. Emer requested prescription to be called to [facility] pharmacy.  Also reviewed with pt appt for tomorrow and Wednesday with Dr Michell Heinrich for initiation of appropriate therapy.  Pt verbalized understanding of the above- including repeating appt date and time as well as need to start the steroids ASAP and stop use of the imitrex.  AB/PA aware of the above - this RN will forward information to Dr Michell Heinrich as well.

## 2012-02-23 ENCOUNTER — Telehealth: Payer: Self-pay | Admitting: *Deleted

## 2012-02-23 ENCOUNTER — Encounter: Payer: Self-pay | Admitting: Physician Assistant

## 2012-02-23 ENCOUNTER — Other Ambulatory Visit (HOSPITAL_BASED_OUTPATIENT_CLINIC_OR_DEPARTMENT_OTHER): Payer: Medicaid Other | Admitting: Lab

## 2012-02-23 ENCOUNTER — Encounter: Payer: Self-pay | Admitting: Radiation Oncology

## 2012-02-23 ENCOUNTER — Ambulatory Visit (HOSPITAL_BASED_OUTPATIENT_CLINIC_OR_DEPARTMENT_OTHER): Payer: Medicaid Other | Admitting: Physician Assistant

## 2012-02-23 VITALS — BP 161/96 | HR 97 | Temp 99.0°F | Resp 20 | Ht 62.0 in | Wt 116.6 lb

## 2012-02-23 DIAGNOSIS — C787 Secondary malignant neoplasm of liver and intrahepatic bile duct: Secondary | ICD-10-CM

## 2012-02-23 DIAGNOSIS — C7952 Secondary malignant neoplasm of bone marrow: Secondary | ICD-10-CM

## 2012-02-23 DIAGNOSIS — C50919 Malignant neoplasm of unspecified site of unspecified female breast: Secondary | ICD-10-CM

## 2012-02-23 DIAGNOSIS — R52 Pain, unspecified: Secondary | ICD-10-CM

## 2012-02-23 DIAGNOSIS — C7951 Secondary malignant neoplasm of bone: Secondary | ICD-10-CM

## 2012-02-23 DIAGNOSIS — C801 Malignant (primary) neoplasm, unspecified: Secondary | ICD-10-CM

## 2012-02-23 DIAGNOSIS — Z5111 Encounter for antineoplastic chemotherapy: Secondary | ICD-10-CM

## 2012-02-23 DIAGNOSIS — C50519 Malignant neoplasm of lower-outer quadrant of unspecified female breast: Secondary | ICD-10-CM

## 2012-02-23 LAB — BASIC METABOLIC PANEL (CC13)
CO2: 23 mEq/L (ref 22–29)
Calcium: 10.3 mg/dL (ref 8.4–10.4)
Chloride: 103 mEq/L (ref 98–107)
Potassium: 3.8 mEq/L (ref 3.5–5.1)
Sodium: 139 mEq/L (ref 136–145)

## 2012-02-23 MED ORDER — PROMETHAZINE HCL 25 MG PO TABS: 12.5000 mg | ORAL_TABLET | Freq: Four times a day (QID) | ORAL | Status: DC | PRN

## 2012-02-23 MED ORDER — GOSERELIN ACETATE 10.8 MG ~~LOC~~ IMPL
10.8000 mg | DRUG_IMPLANT | Freq: Once | SUBCUTANEOUS | Status: AC
  Administered 2012-02-23: 10.8 mg via SUBCUTANEOUS
  Filled 2012-02-23: qty 10.8

## 2012-02-23 MED ORDER — FULVESTRANT 250 MG/5ML IM SOLN
500.0000 mg | Freq: Once | INTRAMUSCULAR | Status: AC
  Administered 2012-02-23: 500 mg via INTRAMUSCULAR
  Filled 2012-02-23: qty 10

## 2012-02-23 MED ORDER — OXYCODONE-ACETAMINOPHEN 7.5-325 MG PO TABS: ORAL_TABLET | ORAL | Status: DC

## 2012-02-23 NOTE — Progress Notes (Signed)
46 year old female. Unemployed. Lives with small son, [name]. Has daughter, [name] and one grandson.   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 ER/PR positive and HER 2 negative. Received 4 cycles of dose dense doxorubicin and cyclophosphamide followed by 4 weekly doses of paclitaxel discontinued November 2011 due to peripheral neuropathy. Patient started tamoxifen briefly in June of 2012 which was discontinued due to nausea. October 2012 CT confirmed liver lesions. [place] contacted Dr. Michell Heinrich 02/03/2012 reference right femur pain. 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   NKDA No indication of a pacemaker 08/06/2010-09/16/2010 radiated right supraclavicular fossa, right chest wall and right drain site. Also, Dr. Darrall Dears noted indicates T spine radiation completed November 2011.

## 2012-02-23 NOTE — Progress Notes (Signed)
ID: [name]   DOB: July 04, 1965  MR#: [id number]  CSN#:[id number]  HISTORY OF PRESENT ILLNESS: The patient delivered a child on 05/17/2009, after which she felt fullness in the right breast. She eventually brought this to her doctors attention and was scheduled for mammography and ultrasound on 03/08/2009. There were no prior exams for comparison. Mammography showed the breast to be dense, with a 5 cm obscured mass in the outer midportion of the right breast. Mass was easily palpable, and there was also a mass in the outer right periareolar region. They were suspicious microcalcifications in the outer midportion of the right breast, as well as within the mass itself. Ultrasound showed an extensive heterogeneous area, corresponding to the palpable abnormality with multiple solid and cystic foci. Overall, the mass measured in excess of 5 cm, in addition to cysts in the breast.  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 The tumors were ER +100%, PR +80%.  Bilateral breast MRI was obtained 03/22/2009. There was a large area of abnormal enhancement in the lower outer quadrant of the right breast measuring up to 9.7 cm. No abnormal appearing lymph nodes and no other masses in either breast.  Patient underwent definitive right mastectomy and sentinel lymph node sampling on 04/29/2009 under the care of Dr. Derrell Lolling. 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 Repeat prognostic panel showed tumor to be ER +93% and PR +51%. HER-2/neu was negative with a ratio of 0.71, with proliferation marker of 36%.  Patient was evaluated by Dr. Darnelle Catalan in January 2011 but failed to keep appointments until she was seen here again in July 2011. In July, she began adjuvant chemotherapy. She received 4 cycles of dose dense  doxorubicin and cyclophosphamide followed by 4 weekly doses of paclitaxel which was discontinued in November 2011 secondary to peripheral neuropathy. The patient then received radiation therapy to the right chest wall which was truncated due to problems with missing appointments.  Patient was started on tamoxifen briefly in June of 2012 which was discontinued due to nausea. The nausea continued, and an abdominal CT in October 2012 confirmed a growth in liver lesions. These were biopsied on 03/10/2011 and showed metastatic adenocarcinoma, still ER positive at 100%, PR +5%. Her subsequent treatments are as detailed below  INTERVAL HISTORY: [name] returns today with her daughter and grandson for followup of her metastatic breast carcinoma. She's currently receiving Faslodex and Zoladex, due for both injections today.  Interval history is remarkable for the patient having developed severe headaches, blurred vision, and diplopia, with intermittent nausea over the past week. A brain MRI on 02/20/2012 showed a destructive lesion of the clivus with extension into the cavernous sinus region and slight compression on the undersurface of the optic chiasm/proximal optic nerves by the superior displacement pituitary gland. These results were relayed to Dr. Myna Hidalgo, our physician on call over the weekend, and he left a voicemail message for the patient. We then spoke with the patient on Monday morning, and also instructed her to begin on Decadron, 4 mg 3 times daily, beginning 02/22/2012.   The headaches continue although they have improved slightly since beginning the dexamethasone. She continues to have blurred vision and double vision and has noticed that she is "cross eyed". She continues to have occasional nausea, but no emesis. Phenergan is helpful.   REVIEW OF SYSTEMS: [name]  continues to complain of back pain, hip pain, and pain in the right leg. In fact she was already scheduled to meet with Dr. Michell Heinrich  this week to discuss radiation to the femur. Although she gets some relief from oxycodone/APAP, the pain seems to be worsening.  She does not feel that her current pain medication is working as well as it previously was.  [name] denies any fevers or chills. She's noticed no skin changes or abnormal bleeding. She tends to be slightly on the constipated side, but manages to have bowel movements at least every other day. She's had no new cough or increased shortness of breath. No chest pain or palpitations.  A detailed review of systems is otherwise stable and noncontributory.  PAST MEDICAL HISTORY: Past Medical History  Diagnosis Date  . Hypertension   . GERD (gastroesophageal reflux disease)   . 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 Pyelonephritis 10/01/11  . Pneumonia     "once"  . Recurrent UTI (urinary tract infection) 10/01/11  . S/P chemotherapy, time since 4-12 weeks   . S/P radiation > 12 weeks   . 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 Brain cancer     spread from breast ca    PAST SURGICAL HISTORY: Past Surgical History  Procedure Date  . Cesarean section 1989; 2010  . Mastectomy 2011    right  . Back surgery 2012    "removed tumor,cancer, from my spine  . Port-a-cath removal 2012    right chest  . Portacath placement 2011    right  . Tubal ligation 2010  . Breast biopsy 2011    right    FAMILY HISTORY The patient's father is alive at age 20.  The patient's mother is alive at age 62.  She has four sisters.  She had one brother who died from colon cancer at the age of 3.  There is no breast or ovarian cancer history in the family to her knowledge.  GYNECOLOGIC HISTORY: She is GX P2.  First pregnancy to term at age 59.  Last menstrual period began January 9th.  Her periods usually are regular, once a month, last four days, of which two are heavy days. She never took oral contraceptives.  SOCIAL HISTORY: The patient is not  employed.  She lives by herself with her small son whose name is Engineer, technical sales.  Her first child, [name], is studying business in this area.  The patient has one grandchild.  She attends the [employer].    ADVANCED DIRECTIVES:  HEALTH MAINTENANCE: History  Substance Use Topics  . Smoking status: Current Every Day Smoker -- 0.2 packs/day for 31 years    Types: Cigarettes  . Smokeless tobacco: Never Used  . Alcohol Use: No     Colonoscopy:  PAP:  Bone density:  Lipid panel:  No Known Allergies  Current Outpatient Prescriptions  Medication Sig Dispense Refill  . amLODipine (NORVASC) 5 MG tablet Take 5 mg by mouth daily.      . cephALEXin (KEFLEX) 500 MG capsule Take 1 capsule (500 mg total) by mouth 4 (four) times daily.  28 capsule  0  . cyclobenzaprine (FLEXERIL) 10 MG tablet Take 10 mg by mouth 3 (three) times daily as needed. For muscle spasms      . dexamethasone (DECADRON) 4 MG tablet Take 1 tablet (4 mg total) by mouth 3 (three) times daily with meals.  90  tablet  1  . Multiple Vitamin (MULTIVITAMIN WITH MINERALS) TABS Take 1 tablet by mouth daily.      [name] oxyCODONE-acetaminophen (PERCOCET) 7.5-325 MG per tablet 1-2 tabs PO q 6 hrs PRN pain  60 tablet  0  . promethazine (PHENERGAN) 25 MG tablet Take 0.5-1 tablets (12.5-25 mg total) by mouth every 6 (six) hours as needed.  30 tablet  1  . desloratadine-pseudoephedrine (CLARINEX-D 24-HOUR) 5-240 MG per 24 hr tablet Take 1 tablet by mouth daily.      [name] omeprazole (PRILOSEC) 20 MG capsule Take 20 mg by mouth daily.      . SUMAtriptan (IMITREX) 50 MG tablet Take 1 tablet (50 mg total) by mouth as directed.  10 tablet  0  . DISCONTD: Alum & Mag Hydroxide-Simeth (MAGIC MOUTHWASH W/LIDOCAINE) SOLN Take 5 mLs by mouth 3 (three) times daily as needed (take 15 minutes prior to meals.).  480 mL  0   Current Facility-Administered Medications  Medication Dose Route Frequency Provider Last Rate Last Dose  . fulvestrant (FASLODEX)  injection 500 mg  500 mg Intramuscular Once Lowella Dell, MD      . goserelin (ZOLADEX) injection 10.8 mg  10.8 mg Subcutaneous Once Lowella Dell, MD        OBJECTIVE: Middle-aged [place] American woman who appears tired and anxious Filed Vitals:   02/23/12 1411  BP: 161/96  Pulse: 97  Temp: 99 F (37.2 C)  Resp: 20     Body mass index is 21.33 kg/(m^2).    ECOG FS:2   Sclerae unicteric. Strabismus affecting the left eye.  Oropharynx clear No cervical or supraclavicular adenopathy Lungs no rales or rhonchi Heart regular rate and rhythm Abd soft, nontender; positive bowel sounds MSK no focal spinal tenderness, no peripheral edema Neuro: nonfocal, lack of abduction of left eye Breasts: Deferred   LAB RESULTS: Lab Results  Component Value Date   WBC 5.9 02/15/2012   NEUTROABS 3.8 02/15/2012   HGB 14.3 02/15/2012   HCT 42.0 02/15/2012   MCV 84.7 02/15/2012   PLT 351 02/15/2012      Chemistry      Component Value Date/Time   NA 142 02/15/2012 1432   K 3.8 02/15/2012 1432   CL 106 02/15/2012 1432   CO2 27 12/30/2011 1304   BUN 12 02/15/2012 1432   CREATININE 0.90 02/15/2012 1432      Component Value Date/Time   CALCIUM 9.6 12/30/2011 1304   ALKPHOS 152* 12/30/2011 1304   AST 24 12/30/2011 1304   ALT 14 12/30/2011 1304   BILITOT 0.4 12/30/2011 1304       Lab Results  Component Value Date   LABCA2 21 09/09/2011    STUDIES: Dg Chest 2 View  02/15/2012  *RADIOLOGY REPORT*  Clinical Data: Chest pain headache  CHEST - 2 VIEW  Comparison: 10/01/2011  Findings: Right mastectomy changes.  Pulmonary hyperinflation is present compatible with chronic lung disease.  Negative for infiltrate or effusion.  No mass lesion or heart failure.  Nipple shadow on the left again noted. Sclerotic lesions in the thoracic spine compatible with metastatic disease.  IMPRESSION: No acute abnormality.   Original Report Authenticated By: Camelia Phenes, M.D.    Dg Scapula Left  02/02/2012   *RADIOLOGY REPORT*  Clinical Data: History of breast cancer, now with left posterior shoulder pain, no known injury  LEFT SCAPULA - 2+ VIEWS  Comparison: PET CT - 01/06/2010  Findings: No definite fracture or dislocation.  No definite  aggressive osseous lesions.  Limited visualization of the adjacent thorax is normal.  Regional soft tissues are normal.  IMPRESSION: No explanation for patient's right posterior shoulder pain.   Original Report Authenticated By: Waynard Reeds, M.D.    Dg Hip Complete Right  02/02/2012  *RADIOLOGY REPORT*  Clinical Data: Mid thigh pain.  No known injury.  History of breast cancer per EMR.  RIGHT HIP - COMPLETE 2+ VIEW  Comparison: Pelvic CT 10/01/2011.  Findings: There is ill-defined lucency within the intertrochanteric region which is similar to the prior CT examination. There is questionable minimal cortical irregularity medially near the lesser trochanter.  No displaced fracture is identified.  There are no blastic lesions.  There is no evidence of dislocation.  The hip joint spaces are preserved.  IMPRESSION: Ill-defined lucency within the right femoral neck appears similar to prior CT, and no definite pathologic fracture is identified. However, given the patient's history, metastatic disease cannot be completely excluded.  If the patient has persistent hip pain or inability to bear weight, this would be best further assessed with non emergent MRI.   Original Report Authenticated By: Gerrianne Scale, M.D.    Dg Femur Right  02/02/2012  *RADIOLOGY REPORT*  Clinical Data: Mid right thigh pain.  No known injury.  History of breast cancer according to EMR.  RIGHT FEMUR - 2 VIEW  Comparison: Abdominal pelvic CT 10/01/2011.  Findings: There is ill-defined lucency within the intertrochanteric region which appears similar to the prior CT.  No displaced fracture or blastic lesion is seen within the femur.  The CT did show blastic metastases throughout the spine.  IMPRESSION: No  evidence of acute fracture or dislocation.  Ill-defined lucency within the intertrochanteric region could reflect underlying metastasis or incidental benign lesion.   Original Report Authenticated By: Gerrianne Scale, M.D.    Ct Head W Wo Contrast  02/15/2012  *RADIOLOGY REPORT*  Clinical Data: Headaches.  History of breast cancer.  CT HEAD WITHOUT AND WITH CONTRAST  Technique:  Contiguous axial images were obtained from the base of the skull through the vertex without and with intravenous contrast.  Contrast: OMNIPAQUE IOHEXOL 300 MG/ML  SOLN  Comparison: None.  Findings: The ventricles are normal.  No extra-axial fluid collections are seen.  The brainstem and cerebellum are unremarkable.  No acute intracranial findings such as infarction or hemorrhage.  No mass lesions.  The bony calvarium is intact.  The visualized paranasal sinuses and mastoid air cells are clear.  IMPRESSION: No CT findings for acute intracranial abnormality or metastatic disease.   Original Report Authenticated By: P. Loralie Champagne, M.D.    Mr [name] ZO Contrast  02/20/2012  *RADIOLOGY REPORT*  Clinical Data: Breast cancer.  High blood pressure.  Severe headaches increased double vision.  MRI HEAD WITHOUT AND WITH CONTRAST  Technique:  Multiplanar, multiecho pulse sequences of the brain and surrounding structures were obtained according to standard protocol without and with intravenous contrast  Contrast: 10mL MULTIHANCE GADOBENATE DIMEGLUMINE 529 MG/ML IV SOLN  Comparison: None.  Findings: Destructive lesion of the clivus with extension towards / into the cavernous sinus region with pituitary gland displaced superiorly with buckling of the infundibulum consistent with metastatic disease. Slight impression upon the undersurface of the optic chiasm/proximal optic nerves by the superior displaced pituitary gland.  Metastatic involvement of the cervical spine suspected although incompletely assessed.  No brain parenchymal  enhancing lesion.  No acute infarct.  No intracranial hemorrhage.  Major intracranial  vascular structures are patent.  Very mild nonspecific white matter type changes.  No hydrocephalus.  Minimal mucosal thickening ethmoid sinus air cells and mastoid air cells.  IMPRESSION:  Destructive lesion of the clivus with extension towards / into the cavernous sinus region with pituitary gland displaced superiorly with buckling of the infundibulum consistent with metastatic disease. Slight impression upon the undersurface of the optic chiasm/proximal optic nerves by the superior displaced pituitary gland.  Metastatic involvement of the cervical spine suspected although incompletely assessed.  No brain parenchymal enhancing lesion.  Critical Value/emergent results were called by telephone at the time of interpretation on 02/20/2012 at 6:45 p.m. to Dr. Myna Hidalgo, who verbally acknowledged these results.   Original Report Authenticated By: Fuller Canada, M.D.     ASSESSMENT:   46 y.o.  [place] woman    1. 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    2.Status post adjuvant chemotherapy July to November 2011, consisting of 4 cycles of dose dense doxorubicin and cyclophosphamide, followed by 4 of 12 planned weekly doses of paclitaxel given adjuvantly, discontinued secondary to peripheral neuropathy.    3. Status post radiation to the right chest wall, truncated due to problems with the patient missing appointments.   4. Tamoxifen started June of 2012, but discontinued due to nausea.    5. metastatic disease to liver and bone pathologically documented October of 2012, estrogen receptor 100% positive, progesterone receptor 7% positive, with no HER-2 amplification;  treatment in the metastatic setting has consisted  of:   6.Faslodex since 03/16/2011, Zoladex every 3 months also started on 03/16/2011, and monthly zoledronic acid started November 2011, recently held due to pending dental procedures and possible extractions.             7. Status post thoracic laminectomy, T9-T11, for impending cord compression on 03/21/2011, followed by irradiation to the spine completed November 2011    PLAN:  Gioia will receive both Faslodex and Zoladex injections today. She has not been receiving zoledronic acid due to pending dental procedures, and she has one more tooth that needs extracting.  Accordingly, we will hold zoledronic acid at this time, but will review this plan with Dr. Darnelle Catalan.  In the meanwhile, she scheduled to see Dr. Michell Heinrich tomorrow to discuss radiation therapy. She will continue on the oral dexamethasone at 4 mg 3 times daily.   I prescribed a slightly higher dose of pain medication since Roma is no longer taking Imitrex for the headaches. We increased her oxycodone/APAP from 5/325 mg tablets to 7.5/325 mg tablets. I also refilled her Phenergan for mild nausea. She is scheduled for repeat labs here in one week, and we'll see Dr. Darnelle Catalan in 2 weeks. All this was reviewed with Sarh and her daughter today, both of whom voice understanding and agreement with this plan.   Jamya Starry    02/23/2012

## 2012-02-23 NOTE — Telephone Encounter (Signed)
CANCEL lab and injection appt on 9/25; lab 03/01/12; GM 03/08/12  Patient aware of the October appointments

## 2012-02-24 ENCOUNTER — Ambulatory Visit: Payer: Medicaid Other | Admitting: Radiation Oncology

## 2012-02-24 ENCOUNTER — Ambulatory Visit: Payer: Medicaid Other

## 2012-02-24 ENCOUNTER — Telehealth: Payer: Self-pay | Admitting: Radiation Oncology

## 2012-02-24 ENCOUNTER — Other Ambulatory Visit: Payer: Medicaid Other | Admitting: Lab

## 2012-02-24 DIAGNOSIS — Z510 Encounter for antineoplastic radiation therapy: Secondary | ICD-10-CM | POA: Insufficient documentation

## 2012-02-24 DIAGNOSIS — C50919 Malignant neoplasm of unspecified site of unspecified female breast: Secondary | ICD-10-CM | POA: Insufficient documentation

## 2012-02-24 DIAGNOSIS — C7951 Secondary malignant neoplasm of bone: Secondary | ICD-10-CM | POA: Insufficient documentation

## 2012-02-24 NOTE — Telephone Encounter (Signed)
Patient yet to show for 1330 appointment with Dr. Michell Heinrich. Left message for patient encouraging her to contact our staff and reschedule.

## 2012-02-25 ENCOUNTER — Other Ambulatory Visit: Payer: Self-pay | Admitting: *Deleted

## 2012-02-25 DIAGNOSIS — C50919 Malignant neoplasm of unspecified site of unspecified female breast: Secondary | ICD-10-CM

## 2012-02-25 DIAGNOSIS — C7951 Secondary malignant neoplasm of bone: Secondary | ICD-10-CM

## 2012-02-25 MED ORDER — OXYCODONE-ACETAMINOPHEN 7.5-325 MG PO TABS: ORAL_TABLET | ORAL | Status: DC

## 2012-02-29 ENCOUNTER — Ambulatory Visit: Payer: Medicaid Other | Admitting: Physician Assistant

## 2012-03-01 ENCOUNTER — Ambulatory Visit: Payer: Medicaid Other | Admitting: Radiation Oncology

## 2012-03-01 ENCOUNTER — Ambulatory Visit: Payer: Medicaid Other | Admitting: Oncology

## 2012-03-01 ENCOUNTER — Ambulatory Visit: Admission: RE | Admit: 2012-03-01 | Payer: Medicaid Other | Source: Ambulatory Visit

## 2012-03-01 ENCOUNTER — Other Ambulatory Visit: Payer: Medicaid Other | Admitting: Lab

## 2012-03-01 ENCOUNTER — Telehealth: Payer: Self-pay | Admitting: Radiation Oncology

## 2012-03-01 NOTE — Telephone Encounter (Signed)
Patient no shown for consult with Dr. Michell Heinrich today. Phoned patient at number listed in demographics. Patient confused appointment time. Patient has requested to reschedule. Informed Dr. Michell Heinrich of this findings. Will contact [facility] to have the patient rescheduled.

## 2012-03-08 ENCOUNTER — Ambulatory Visit (HOSPITAL_BASED_OUTPATIENT_CLINIC_OR_DEPARTMENT_OTHER): Payer: Medicaid Other | Admitting: Oncology

## 2012-03-08 VITALS — BP 168/91 | HR 85 | Temp 99.0°F | Resp 20 | Ht 62.0 in | Wt 127.0 lb

## 2012-03-08 DIAGNOSIS — C50519 Malignant neoplasm of lower-outer quadrant of unspecified female breast: Secondary | ICD-10-CM

## 2012-03-08 DIAGNOSIS — C7951 Secondary malignant neoplasm of bone: Secondary | ICD-10-CM

## 2012-03-08 DIAGNOSIS — C787 Secondary malignant neoplasm of liver and intrahepatic bile duct: Secondary | ICD-10-CM

## 2012-03-08 DIAGNOSIS — G9389 Other specified disorders of brain: Secondary | ICD-10-CM

## 2012-03-08 MED ORDER — SUMATRIPTAN SUCCINATE 50 MG PO TABS: 50.0000 mg | ORAL_TABLET | ORAL | Status: DC

## 2012-03-08 MED ORDER — OXYCODONE-ACETAMINOPHEN 7.5-325 MG PO TABS: 1.0000 | ORAL_TABLET | Freq: Two times a day (BID) | ORAL | Status: DC | PRN

## 2012-03-08 NOTE — Progress Notes (Signed)
ID: [name]   DOB: [birth year]  MR#: [id number]  CSN#:[id number]  HISTORY OF PRESENT ILLNESS: The patient delivered a child on 05/17/2009, after which she felt fullness in the right breast. She eventually brought this to her doctors attention and was scheduled for mammography and ultrasound on 03/08/2009. There were no prior exams for comparison. Mammography showed the breast to be dense, with a 5 cm obscured mass in the outer midportion of the right breast. Mass was easily palpable, and there was also a mass in the outer right periareolar region. They were suspicious microcalcifications in the outer midportion of the right breast, as well as within the mass itself. Ultrasound showed an extensive heterogeneous area, corresponding to the palpable abnormality with multiple solid and cystic foci. Overall, the mass measured in excess of 5 cm, in addition to cysts in the breast.  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 The tumors were ER +100%, PR +80%.  Bilateral breast MRI was obtained 03/22/2009. There was a large area of abnormal enhancement in the lower outer quadrant of the right breast measuring up to 9.7 cm. No abnormal appearing lymph nodes and no other masses in either breast.  Patient underwent definitive right mastectomy and sentinel lymph node sampling on 04/29/2009 under the care of Dr. Derrell Lolling. 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 Repeat prognostic panel showed tumor to be ER +93% and PR +51%. HER-2/neu was negative with a ratio of 0.71, with proliferation marker of 36%.  Patient was evaluated by Dr. Darnelle Catalan in January 2011 but failed to keep appointments until she was seen here again in July 2011. In July, she began adjuvant chemotherapy. She received 4 cycles of dose dense  doxorubicin and cyclophosphamide followed by 4 weekly doses of paclitaxel which was discontinued in November 2011 secondary to peripheral neuropathy. The patient then received radiation therapy to the right chest wall which was truncated due to problems with missing appointments.  Patient was started on tamoxifen briefly in June of 2012 which was discontinued due to nausea. The nausea continued, and an abdominal CT in October 2012 confirmed a growth in liver lesions. These were biopsied on 03/10/2011 and showed metastatic adenocarcinoma, still ER positive at 100%, PR +5%. Her subsequent treatments are as detailed below  INTERVAL HISTORY: [name] returns today with her daughter and grandson for followup of her metastatic breast carcinoma. She was started on dexamethasone at the last visit, and is going to be seeing Dr. Michell Heinrich 03/10/2012 to discuss radiation to her clivus lesion  REVIEW OF SYSTEMS: She is having significant headaches. Of course she has a history of migraines. For some reason she stopped taking Imitrex. In addition she has pain in her right leg. This is accompanied by cramping of. She has double vision in the left eye. The right eye is okay, although "it getting jumpy". She has alternating diarrhea and constipation. She is just getting over a urinary tract infection. She is mostly not smoking. Hot flashes remain a concern. She has nausea and vomiting sometimes but is gaining quite a bit of weight because of the steroids. A detailed review of systems was otherwise noncontributory.  PAST MEDICAL HISTORY: Past Medical History  Diagnosis Date  . Hypertension   . GERD (gastroesophageal reflux disease)   . 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  Pyelonephritis 10/01/11  . Pneumonia     "once"  . Recurrent UTI (urinary tract infection) 10/01/11  . S/P chemotherapy, time since 4-12 weeks   . S/P radiation > 12 weeks   . 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 03/21/2011    bone and  brain  . Brain cancer     spread from breast ca    PAST SURGICAL HISTORY: Past Surgical History  Procedure Date  . Cesarean section 1989; 2010  . Mastectomy 2011    right  . Back surgery 2012    "removed tumor,cancer, from my spine  . Port-a-cath removal 2012    right chest  . Portacath placement 2011    right  . Tubal ligation 2010  . Breast biopsy 2011    right    FAMILY HISTORY The patient's father is alive at age 37.  The patient's mother is alive at age 32.  She has four sisters.  She had one brother who died from colon cancer at the age of 63.  There is no breast or ovarian cancer history in the family to her knowledge.  GYNECOLOGIC HISTORY: She is GX P2.  First pregnancy to term at age 75.    SOCIAL HISTORY: The patient is not employed.  She lives by herself with her small son whose name is Engineer, technical sales.  Her first child, [name], is studying business in this area.  The patient has one grandchild.  She attends the [employer].    ADVANCED DIRECTIVES: not in place  HEALTH MAINTENANCE: History  Substance Use Topics  . Smoking status: Current Every Day Smoker -- 0.2 packs/day for 31 years    Types: Cigarettes  . Smokeless tobacco: Never Used  . Alcohol Use: No     Colonoscopy:  PAP:  Bone density:  Lipid panel:  No Known Allergies  Current Outpatient Prescriptions  Medication Sig Dispense Refill  . amLODipine (NORVASC) 5 MG tablet Take 5 mg by mouth daily.      . cephALEXin (KEFLEX) 500 MG capsule Take 1 capsule (500 mg total) by mouth 4 (four) times daily.  28 capsule  0  . cyclobenzaprine (FLEXERIL) 10 MG tablet Take 10 mg by mouth 3 (three) times daily as needed. For muscle spasms      . desloratadine-pseudoephedrine (CLARINEX-D 24-HOUR) 5-240 MG per 24 hr tablet Take 1 tablet by mouth daily.      [name] dexamethasone (DECADRON) 4 MG tablet Take 1 tablet (4 mg total) by mouth 3 (three) times daily with meals.  90 tablet  1  . Multiple Vitamin (MULTIVITAMIN  WITH MINERALS) TABS Take 1 tablet by mouth daily.      [name] omeprazole (PRILOSEC) 20 MG capsule Take 20 mg by mouth daily.      [name] oxyCODONE-acetaminophen (PERCOCET) 7.5-325 MG per tablet 1-2 tabs PO q 6 hrs PRN pain  60 tablet  0  . promethazine (PHENERGAN) 25 MG tablet Take 0.5-1 tablets (12.5-25 mg total) by mouth every 6 (six) hours as needed.  30 tablet  1  . SUMAtriptan (IMITREX) 50 MG tablet Take 1 tablet (50 mg total) by mouth as directed.  10 tablet  0  . DISCONTD: Alum & Mag Hydroxide-Simeth (MAGIC MOUTHWASH W/LIDOCAINE) SOLN Take 5 mLs by mouth 3 (three) times daily as needed (take 15 minutes prior to meals.).  480 mL  0    OBJECTIVE: Middle-aged African American woman  Filed Vitals:   03/08/12 1010  BP: 168/91  Pulse: 85  Temp: 99  F (37.2 C)  Resp: 20     Body mass index is 23.23 kg/(m^2).    ECOG FS:2   Sclerae unicteric. Strabismus left eye.  Oropharynx clear No cervical or supraclavicular adenopathy Lungs no rales or rhonchi Heart regular rate and rhythm Abd soft, moderately obese, nontender; positive bowel sounds MSK no focal spinal tenderness, no peripheral edema Neuro: nonfocal, with no defined motor or sensory deficits; lack of abduction of left eye Breasts: Deferred   LAB RESULTS: Lab Results  Component Value Date   WBC 5.9 02/15/2012   NEUTROABS 3.8 02/15/2012   HGB 14.3 02/15/2012   HCT 42.0 02/15/2012   MCV 84.7 02/15/2012   PLT 351 02/15/2012      Chemistry      Component Value Date/Time   NA 139 02/23/2012 1339   NA 142 02/15/2012 1432   K 3.8 02/23/2012 1339   K 3.8 02/15/2012 1432   CL 103 02/23/2012 1339   CL 106 02/15/2012 1432   CO2 23 02/23/2012 1339   CO2 27 12/30/2011 1304   BUN 19.0 02/23/2012 1339   BUN 12 02/15/2012 1432   CREATININE 1.1 02/23/2012 1339   CREATININE 0.90 02/15/2012 1432      Component Value Date/Time   CALCIUM 10.3 02/23/2012 1339   CALCIUM 9.6 12/30/2011 1304   ALKPHOS 152* 12/30/2011 1304   AST 24 12/30/2011 1304   ALT 14  12/30/2011 1304   BILITOT 0.4 12/30/2011 1304       Lab Results  Component Value Date   LABCA2 21 09/09/2011    STUDIES: Dg Chest 2 View  02/15/2012  *RADIOLOGY REPORT*  Clinical Data: Chest pain headache  CHEST - 2 VIEW  Comparison: 10/01/2011  Findings: Right mastectomy changes.  Pulmonary hyperinflation is present compatible with chronic lung disease.  Negative for infiltrate or effusion.  No mass lesion or heart failure.  Nipple shadow on the left again noted. Sclerotic lesions in the thoracic spine compatible with metastatic disease.  IMPRESSION: No acute abnormality.   Original Report Authenticated By: Camelia Phenes, M.D.    Dg Scapula Left  02/02/2012  *RADIOLOGY REPORT*  Clinical Data: History of breast cancer, now with left posterior shoulder pain, no known injury  LEFT SCAPULA - 2+ VIEWS  Comparison: PET CT - 01/06/2010  Findings: No definite fracture or dislocation.  No definite aggressive osseous lesions.  Limited visualization of the adjacent thorax is normal.  Regional soft tissues are normal.  IMPRESSION: No explanation for patient's right posterior shoulder pain.   Original Report Authenticated By: Waynard Reeds, M.D.    Dg Hip Complete Right  02/02/2012  *RADIOLOGY REPORT*  Clinical Data: Mid thigh pain.  No known injury.  History of breast cancer per EMR.  RIGHT HIP - COMPLETE 2+ VIEW  Comparison: Pelvic CT 10/01/2011.  Findings: There is ill-defined lucency within the intertrochanteric region which is similar to the prior CT examination. There is questionable minimal cortical irregularity medially near the lesser trochanter.  No displaced fracture is identified.  There are no blastic lesions.  There is no evidence of dislocation.  The hip joint spaces are preserved.  IMPRESSION: Ill-defined lucency within the right femoral neck appears similar to prior CT, and no definite pathologic fracture is identified. However, given the patient's history, metastatic disease cannot be  completely excluded.  If the patient has persistent hip pain or inability to bear weight, this would be best further assessed with non emergent MRI.   Original Report Authenticated  By: Gerrianne Scale, M.D.    Dg Femur Right  02/02/2012  *RADIOLOGY REPORT*  Clinical Data: Mid right thigh pain.  No known injury.  History of breast cancer according to EMR.  RIGHT FEMUR - 2 VIEW  Comparison: Abdominal pelvic CT 10/01/2011.  Findings: There is ill-defined lucency within the intertrochanteric region which appears similar to the prior CT.  No displaced fracture or blastic lesion is seen within the femur.  The CT did show blastic metastases throughout the spine.  IMPRESSION: No evidence of acute fracture or dislocation.  Ill-defined lucency within the intertrochanteric region could reflect underlying metastasis or incidental benign lesion.   Original Report Authenticated By: Gerrianne Scale, M.D.    Ct Head W Wo Contrast  02/15/2012  *RADIOLOGY REPORT*  Clinical Data: Headaches.  History of breast cancer.  CT HEAD WITHOUT AND WITH CONTRAST  Technique:  Contiguous axial images were obtained from the base of the skull through the vertex without and with intravenous contrast.  Contrast: OMNIPAQUE IOHEXOL 300 MG/ML  SOLN  Comparison: None.  Findings: The ventricles are normal.  No extra-axial fluid collections are seen.  The brainstem and cerebellum are unremarkable.  No acute intracranial findings such as infarction or hemorrhage.  No mass lesions.  The bony calvarium is intact.  The visualized paranasal sinuses and mastoid air cells are clear.  IMPRESSION: No CT findings for acute intracranial abnormality or metastatic disease.   Original Report Authenticated By: P. Loralie Champagne, M.D.    Mr [name] NW Contrast  02/20/2012  *RADIOLOGY REPORT*  Clinical Data: Breast cancer.  High blood pressure.  Severe headaches increased double vision.  MRI HEAD WITHOUT AND WITH CONTRAST  Technique:  Multiplanar,  multiecho pulse sequences of the brain and surrounding structures were obtained according to standard protocol without and with intravenous contrast  Contrast: 10mL MULTIHANCE GADOBENATE DIMEGLUMINE 529 MG/ML IV SOLN  Comparison: None.  Findings: Destructive lesion of the clivus with extension towards / into the cavernous sinus region with pituitary gland displaced superiorly with buckling of the infundibulum consistent with metastatic disease. Slight impression upon the undersurface of the optic chiasm/proximal optic nerves by the superior displaced pituitary gland.  Metastatic involvement of the cervical spine suspected although incompletely assessed.  No brain parenchymal enhancing lesion.  No acute infarct.  No intracranial hemorrhage.  Major intracranial vascular structures are patent.  Very mild nonspecific white matter type changes.  No hydrocephalus.  Minimal mucosal thickening ethmoid sinus air cells and mastoid air cells.  IMPRESSION:  Destructive lesion of the clivus with extension towards / into the cavernous sinus region with pituitary gland displaced superiorly with buckling of the infundibulum consistent with metastatic disease. Slight impression upon the undersurface of the optic chiasm/proximal optic nerves by the superior displaced pituitary gland.  Metastatic involvement of the cervical spine suspected although incompletely assessed.  No brain parenchymal enhancing lesion.  Critical Value/emergent results were called by telephone at the time of interpretation on 02/20/2012 at 6:45 p.m. to Dr. Myna Hidalgo, who verbally acknowledged these results.   Original Report Authenticated By: Fuller Canada, M.D.     ASSESSMENT:   46 y.o.   woman    1. 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    2.Status post  adjuvant chemotherapy July to November 2011, consisting of 4 cycles of dose  dense doxorubicin and cyclophosphamide, followed by 4 of 12 planned weekly doses of paclitaxel given adjuvantly, discontinued secondary to peripheral neuropathy.    3. Status post radiation to the right chest wall, truncated due to problems with the patient missing appointments.   4. Tamoxifen started June of 2012, but discontinued due to nausea.    5. metastatic disease to liver and bone pathologically documented October of 2012, estrogen receptor 100% positive, progesterone receptor 7% positive, with no HER-2 amplification;  treatment in the metastatic setting has consisted of:   6.Faslodex since 03/16/2011, Zoladex every 3 months also started on 03/16/2011, and monthly zoledronic acid started November 2011, recently held due to pending dental procedures and possible extractions.             7. Status post thoracic laminectomy, T9-T11, for impending cord compression on 03/21/2011, followed by irradiation to the spine completed November 2011  8. Brain MRI 02/20/2012 showed a destructive clivus lesion with extension into the car no sinus, compressing the optic chiasm resulting in left diet diplopia    PLAN:  We have many treatments available for Ms. Richter from a systemic point of view, but compliance has been an issue in the past, and a grade advantage of Faslodex and Zoladex is that compliance is on a medically documented. Right now she needs the central nervous system lesion addressed and she will be seeing Dr. Michell Heinrich later this week. Hopefully her Decadron can be tapered quickly as she is having significant weight gain from that medication.   Today I refilled her Imitrex and wrote her for an additional 60 Percocet to take twice a day as needed for pain. She is going to see [place] again on October 22, and shortly before that visit she will be restaged with a CT scan of the chest abdomen and pelvis. If we document  progression in the liver we will consider switching to exemestane/ everolimus   Manika Hast C    03/08/2012

## 2012-03-09 ENCOUNTER — Telehealth: Payer: Self-pay | Admitting: Oncology

## 2012-03-09 NOTE — Telephone Encounter (Signed)
S/w the pt and she is aware to stop by the scheduling desk to pick up her appt calendar along with the appt and instructions for the ct scan

## 2012-03-10 ENCOUNTER — Ambulatory Visit
Admission: RE | Admit: 2012-03-10 | Discharge: 2012-03-10 | Disposition: A | Discharge status: Home / Self Care | Payer: Medicaid Other | Source: Ambulatory Visit | Attending: Radiation Oncology | Admitting: Radiation Oncology

## 2012-03-10 VITALS — BP 164/78 | HR 106 | Temp 98.1°F | Resp 18 | Wt 134.0 lb

## 2012-03-10 DIAGNOSIS — C787 Secondary malignant neoplasm of liver and intrahepatic bile duct: Secondary | ICD-10-CM

## 2012-03-10 DIAGNOSIS — C50919 Malignant neoplasm of unspecified site of unspecified female breast: Secondary | ICD-10-CM

## 2012-03-10 DIAGNOSIS — C7951 Secondary malignant neoplasm of bone: Secondary | ICD-10-CM

## 2012-03-10 NOTE — Progress Notes (Signed)
Department of Radiation Oncology  Phone:  [phone] Fax:        [phone]   Name: BARRY CULVERHOUSE   DOB: 13-Jan-1966  MRN: [id number]    Date: 03/10/2012  Follow Up Visit Note  Diagnosis: Metastatic breast cancer  Interval History: Ona presents today for routine followup.  [name] is about a year and a half out from radiation to her right chest wall and regional lymph nodes to a total dose of 32.4 gray. She is also one year out from radiation to her thoracic spine. She was seen by Dr. Daneil Dolin not in late September with complaints of diplopia. She was also complaining of right hip pain. Imaging including a CT of the head on 02/05/2012 was negative. An MRI of the brain with contrast on September 21 showed a destructive lesion at the clivus with extension into the cavernous sinus. The optic chiasm was felt to be compressed. Metastatic involvement of the cervical spine with suspected. X-ray of the right hip showed a lucency in the femur consistent with metastatic disease. She was placed on Decadron 4 mg 3 times a day by Dr. Daneil Dolin not and has been on that for the past 2 weeks. She said the combination along with Percocet twice a day controls her headaches in her hip pain. She says she feels as though her hip pain is better from her right hip to her left hip. She denies any pain in her back. She denies any problems with bowel or bladder continence. She said she did have some diarrhea on Monday but has not had a bowel movement since. She complains that she constantly feels like eating. She is scheduled for followup scans with Dr. Darnelle Catalan in late October.  She had missed several appointments with me and we were finally able to coordinate an appointment today with a simulation to follow. She denies any difficulties with memory. She said her vision is fine as long as she holds her hand over her left eye. She denies any focal numbness or weakness.   Allergies: No Known Allergies  Medications:    Current Outpatient Prescriptions  Medication Sig Dispense Refill  . dexamethasone (DECADRON) 4 MG tablet Take 1 tablet (4 mg total) by mouth 3 (three) times daily with meals.  90 tablet  1  . oxyCODONE-acetaminophen (PERCOCET) 7.5-325 MG per tablet Take 1 tablet by mouth 2 (two) times daily as needed for pain. 1-2 tabs PO q 6 hrs PRN pain  60 tablet  0  . amLODipine (NORVASC) 5 MG tablet Take 5 mg by mouth daily.      . cephALEXin (KEFLEX) 500 MG capsule Take 1 capsule (500 mg total) by mouth 4 (four) times daily.  28 capsule  0  . cyclobenzaprine (FLEXERIL) 10 MG tablet Take 10 mg by mouth 3 (three) times daily as needed. For muscle spasms      . desloratadine-pseudoephedrine (CLARINEX-D 24-HOUR) 5-240 MG per 24 hr tablet Take 1 tablet by mouth daily.      . Multiple Vitamin (MULTIVITAMIN WITH MINERALS) TABS Take 1 tablet by mouth daily.      [name] omeprazole (PRILOSEC) 20 MG capsule Take 20 mg by mouth daily.      . promethazine (PHENERGAN) 25 MG tablet Take 0.5-1 tablets (12.5-25 mg total) by mouth every 6 (six) hours as needed.  30 tablet  1  . SUMAtriptan (IMITREX) 50 MG tablet Take 1 tablet (50 mg total) by mouth as directed.  10 tablet  0  .  DISCONTD: Alum & Mag Hydroxide-Simeth (MAGIC MOUTHWASH W/LIDOCAINE) SOLN Take 5 mLs by mouth 3 (three) times daily as needed (take 15 minutes prior to meals.).  480 mL  0    Physical Exam:   weight is 134 lb (60.782 kg). Her oral temperature is 98.1 F (36.7 C). Her blood pressure is 164/78 and her pulse is 106. Her respiration is 18.  She has gained a significant amount of weight next is actually the heaviest I've seen her. She has a left lateral rectus palsy. All other structural or muscles are intact. She is alert and oriented x3. Cranial nurse 3 through 12 are otherwise tested and intact. She is 5 out of 5 strength in her bilateral upper and lower extremities. She ambulates normally.  IMPRESSION: Joselle is a 46 y.o. female with metastatic breast  cancer and poor compliance with new right hip pain and diplopia  PLAN:  I discussed palliative treatment of the clivus and cavernous sinus with Dorothea. We discussed about her need for compliance. We discussed the possible side effects of treatment including damage to critical normal structures including the ear and the brain. We discussed that she could lose her hair on the side of her head and this can be temporary or permanent. We discussed treatment of her femur may result in some diarrhea. I am hopeful we can treat these in 10 fractions. I emphasized her again the need to show up for daily treatment. We discussed the making a mask for mobilization for her brain treatment. We will have to be careful in that we may ultimately have come back and treat brain metastases so we'll take that into consideration with this treatment. She has signed informed consent and agree to proceed forward.      [name], MD

## 2012-03-10 NOTE — Progress Notes (Signed)
See progress note under physician encounter. 

## 2012-03-10 NOTE — Progress Notes (Signed)
Patient presented to the clinic today accompanied by her family for a reconsult with Dr. Michell Heinrich. Patient requested her family wait in the waiting room. Patient is alert and oriented to person, place, and time. No distress noted. Steady gait noted. Pleasant affect noted. Patient denies pain at this time. Patient reports that "as long as she take her steroids and pain med she doesn't have any headaches but, she misses a dose her headache is a 7" on a scale of 0-10. Patient reports that she is taking Decadron 4 mg tid. Reported all findings to Dr. Michell Heinrich.

## 2012-03-10 NOTE — Progress Notes (Signed)
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  Identity was confirmed.  All relevant records and images related to the planned course of therapy were reviewed.  Then, the patient was positioned in a stable reproducible clinical set-up for radiation therapy.  CT images were obtained.  An isocenter was placed. Skin markings were placed.  The CT images were loaded into the planning software where the target and avoidance structures were contoured.  The radiation prescription was entered and confirmed. The patient was discharged in stable condition and tolerated simulation well.    TREATMENT PLANNING NOTE:  Treatment planning then occurred. I have requested : MLC's, isodose plan, basic dose calculation  I have requested 3 dimensional simulation with DVH of orbits, gtv and brain.

## 2012-03-13 ENCOUNTER — Encounter (HOSPITAL_COMMUNITY): Payer: Self-pay | Admitting: Emergency Medicine

## 2012-03-13 ENCOUNTER — Emergency Department (HOSPITAL_COMMUNITY)
Admission: EM | Admit: 2012-03-13 | Discharge: 2012-03-13 | Disposition: A | Discharge status: Home / Self Care | Payer: Medicaid Other | Attending: Emergency Medicine | Admitting: Emergency Medicine

## 2012-03-13 DIAGNOSIS — I10 Essential (primary) hypertension: Secondary | ICD-10-CM | POA: Insufficient documentation

## 2012-03-13 DIAGNOSIS — Z79899 Other long term (current) drug therapy: Secondary | ICD-10-CM | POA: Insufficient documentation

## 2012-03-13 DIAGNOSIS — C50919 Malignant neoplasm of unspecified site of unspecified female breast: Secondary | ICD-10-CM | POA: Insufficient documentation

## 2012-03-13 DIAGNOSIS — C7951 Secondary malignant neoplasm of bone: Secondary | ICD-10-CM

## 2012-03-13 DIAGNOSIS — C7931 Secondary malignant neoplasm of brain: Secondary | ICD-10-CM | POA: Insufficient documentation

## 2012-03-13 DIAGNOSIS — M25559 Pain in unspecified hip: Secondary | ICD-10-CM | POA: Insufficient documentation

## 2012-03-13 DIAGNOSIS — C7949 Secondary malignant neoplasm of other parts of nervous system: Secondary | ICD-10-CM | POA: Insufficient documentation

## 2012-03-13 LAB — BASIC METABOLIC PANEL
BUN: 16 mg/dL (ref 6–23)
CO2: 25 mEq/L (ref 19–32)
Chloride: 101 mEq/L (ref 96–112)
Creatinine, Ser: 0.91 mg/dL (ref 0.50–1.10)
GFR calc Af Amer: 86 mL/min — ABNORMAL LOW (ref >90)

## 2012-03-13 LAB — CBC WITH DIFFERENTIAL/PLATELET
Basophils Relative: 0 % (ref 0–1)
HCT: 34.2 % — ABNORMAL LOW (ref 36.0–46.0)
Hemoglobin: 11.5 g/dL — ABNORMAL LOW (ref 12.0–15.0)
MCHC: 33.6 g/dL (ref 30.0–36.0)
MCV: 87.7 fL (ref 78.0–100.0)
Monocytes Absolute: 0.4 10*3/uL (ref 0.1–1.0)
Monocytes Relative: 4 % (ref 3–12)
Neutro Abs: 10.1 10*3/uL — ABNORMAL HIGH (ref 1.7–7.7)

## 2012-03-13 MED ORDER — ONDANSETRON HCL 4 MG/2ML IJ SOLN
4.0000 mg | Freq: Once | INTRAMUSCULAR | Status: AC
  Administered 2012-03-13: 4 mg via INTRAVENOUS
  Filled 2012-03-13: qty 2

## 2012-03-13 MED ORDER — FENTANYL CITRATE 0.05 MG/ML IJ SOLN
50.0000 ug | Freq: Once | INTRAMUSCULAR | Status: DC
  Filled 2012-03-13: qty 2

## 2012-03-13 MED ORDER — OXYCODONE-ACETAMINOPHEN 5-325 MG PO TABS: 1.0000 | ORAL_TABLET | Freq: Four times a day (QID) | ORAL | Status: DC | PRN

## 2012-03-13 MED ORDER — HYDROMORPHONE HCL PF 1 MG/ML IJ SOLN
1.0000 mg | Freq: Once | INTRAMUSCULAR | Status: AC
  Administered 2012-03-13: 1 mg via INTRAVENOUS
  Filled 2012-03-13: qty 1

## 2012-03-13 MED ORDER — SODIUM CHLORIDE 0.9 % IV SOLN
Freq: Once | INTRAVENOUS | Status: AC
  Administered 2012-03-13: 12:00:00 via INTRAVENOUS

## 2012-03-13 NOTE — ED Notes (Signed)
Pt given fluids per md. Pt alert and oriented x4. Respirations even and unlabored, bilateral symmetrical rise and fall of chest. Skin warm and dry. In no acute distress. Denies needs.

## 2012-03-13 NOTE — ED Notes (Addendum)
Pt presenting to ed with c/o right leg pain pt states history of bone cancer pt states pain is 10/10. Pt states she does not start treatment for bone cancer until next week. Pt also c/o constipation. Pt states small bowel movement x 2 days ago.

## 2012-03-13 NOTE — ED Notes (Signed)
Pt alert and oriented x4. Respirations even and unlabored, bilateral symmetrical rise and fall of chest. Skin warm and dry. In no acute distress. Denies needs.   

## 2012-03-13 NOTE — ED Notes (Signed)
md at bedside  Pt alert and oriented x4. Respirations even and unlabored, bilateral symmetrical rise and fall of chest. Skin warm and dry. In no acute distress. Denies needs.   

## 2012-03-13 NOTE — ED Provider Notes (Signed)
History     CSN: [id number]  Arrival date & time 03/13/12  1141   First MD Initiated Contact with Patient 03/13/12 1153      Chief Complaint  Patient presents with  . Leg Pain  . Cancer    (Consider location/radiation/quality/duration/timing/severity/associated sxs/prior treatment) HPI Pt with a hx of breast cancer and bony mets presenting with pain in right hip.  She states she has been having pain in this hip for some time, worse over the past 2-3 days.  She has had no trauma or injury recently.  Is set up to start radiation to that area of her hip and has been tattooed in preparation for this.  No fever/vomiting.  No swelling of leg.  Pain is worse with movement and palpation.  There are no other associated systemic symptoms, there are no other alleviating or modifying factors.   Past Medical History  Diagnosis Date  . Hypertension   . GERD (gastroesophageal reflux disease)   . 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 Pyelonephritis 10/01/11  . Pneumonia     "once"  . Recurrent UTI (urinary tract infection) 10/01/11  . S/P chemotherapy, time since 4-12 weeks   . S/P radiation > 12 weeks   . 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 Brain cancer     spread from breast ca    Past Surgical History  Procedure Date  . Cesarean section 1989; 2010  . Mastectomy 2011    right  . Back surgery 2012    "removed tumor,cancer, from my spine  . Port-a-cath removal 2012    right chest  . Portacath placement 2011    right  . Tubal ligation 2010  . Breast biopsy 2011    right    Family History  Problem Relation Age of Onset  . Cancer Brother 41    died of colon cancer at age of 67    History  Substance Use Topics  . Smoking status: Current Every Day Smoker -- 0.2 packs/day for 31 years    Types: Cigarettes  . Smokeless tobacco: Never Used  . Alcohol Use: No    OB History    Grav Para Term Preterm Abortions TAB SAB Ect Mult Living                    Review of Systems ROS reviewed and all otherwise negative except for mentioned in HPI  Allergies  Review of patient's allergies indicates no known allergies.  Home Medications   Current Outpatient Rx  Name Route Sig Dispense Refill  . AMLODIPINE BESYLATE 5 MG PO TABS Oral Take 5 mg by mouth daily.    . CYCLOBENZAPRINE HCL 10 MG PO TABS Oral Take 10 mg by mouth 3 (three) times daily as needed. For muscle spasms    . DESLORATADINE-PSEUDOEPHED ER 5-240 MG PO TB24 Oral Take 1 tablet by mouth daily.    [name] DEXAMETHASONE 4 MG PO TABS Oral Take 4 mg by mouth 3 (three) times daily with meals.    . ADULT MULTIVITAMIN W/MINERALS CH Oral Take 1 tablet by mouth daily.    [name] OMEPRAZOLE 20 MG PO CPDR Oral Take 20 mg by mouth daily.    [name] PROMETHAZINE HCL 25 MG PO TABS Oral Take 12.5-25 mg by mouth every 6 (six) hours as needed. Nausea    . SUMATRIPTAN SUCCINATE 50 MG PO TABS Oral Take 50 mg by  mouth as directed.    . CEPHALEXIN 500 MG PO CAPS Oral Take 500 mg by mouth 4 (four) times daily.    . OXYCODONE-ACETAMINOPHEN 5-325 MG PO TABS Oral Take 1-2 tablets by mouth every 6 (six) hours as needed for pain. 15 tablet 0    BP 150/80  Pulse 93  Temp 97.9 F (36.6 C) (Oral)  Resp 16  SpO2 99%  LMP 03/02/2011 Vitals reviewed Physical Exam Physical Examination: General appearance - alert, well appearing, and in no distress Mental status - alert, oriented to person, place, and time Eyes - no conjunctival injection, no scleral icterus Mouth - mucous membranes moist, pharynx normal without lesions Chest - clear to auscultation, no wheezes, rales or rhonchi, symmetric air entry Heart - normal rate, regular rhythm, normal S1, S2, no murmurs, rubs, clicks or gallops Abdomen - soft, nontender, nondistended, no masses or organomegaly Extremities - peripheral pulses normal, no pedal edema, no clubbing or cyanosis Skin - normal coloration and turgor, no rashes  ED Course  Procedures  (including critical care time)  Labs Reviewed  CBC WITH DIFFERENTIAL - Abnormal; Notable for the following:    WBC 11.6 (*)     Hemoglobin 11.5 (*)     HCT 34.2 (*)     RDW 17.2 (*)     Neutrophils Relative 87 (*)     Neutro Abs 10.1 (*)     Lymphocytes Relative 9 (*)     All other components within normal limits  BASIC METABOLIC PANEL - Abnormal; Notable for the following:    Glucose, Bld 167 (*)     GFR calc non Af Amer 74 (*)     GFR calc Af Amer 86 (*)     All other components within normal limits  LAB REPORT - SCANNED   No results found.   1. Bone metastases   2. Hip pain       MDM  Pt presents with c/o pain in right hip from known bony metastasis from primary breast cancer.  Pt feels improved after pain meds in ED.  She is already scheduled for followup to start radiation next week.          Ethelda Chick, MD 03/14/12 1259

## 2012-03-15 ENCOUNTER — Ambulatory Visit
Admission: RE | Admit: 2012-03-15 | Discharge: 2012-03-15 | Disposition: A | Discharge status: Home / Self Care | Payer: Medicaid Other | Source: Ambulatory Visit | Attending: Radiation Oncology | Admitting: Radiation Oncology

## 2012-03-15 ENCOUNTER — Encounter: Payer: Self-pay | Admitting: Radiation Oncology

## 2012-03-15 ENCOUNTER — Ambulatory Visit (HOSPITAL_COMMUNITY)
Admission: RE | Admit: 2012-03-15 | Discharge: 2012-03-15 | Disposition: A | Discharge status: Home / Self Care | Payer: Medicaid Other | Source: Ambulatory Visit | Attending: Oncology | Admitting: Oncology

## 2012-03-15 VITALS — BP 121/77 | HR 92 | Resp 18 | Wt 135.1 lb

## 2012-03-15 DIAGNOSIS — C50919 Malignant neoplasm of unspecified site of unspecified female breast: Secondary | ICD-10-CM

## 2012-03-15 NOTE — Progress Notes (Signed)
Patient presents to the clinic today for PUT with Dr. Michell Heinrich. Patient alert and oriented to person, place, and time. No distress noted. Steady gait noted. Pleasant affect noted. Patient reports constant aching right hip pain 8 on a scale of 0-10 which increases with ambulation. Patient reports taking percocet for pain. Patient reports her "appetite is through the roof with these steroids" as she eats a honey bun. Patient reports taking decadron 4 mg bid. Patient denies difficulty sleeping. Reported all findings to Dr. Michell Heinrich.

## 2012-03-15 NOTE — Progress Notes (Signed)
Weekly Management Note Current Dose:  3 Gy  Projected Dose: 30 Gy   Narrative:  The patient presents for routine under treatment assessment.  CBCT/MVCT images/Port film x-rays were reviewed.  The chart was checked. Vision the same on decadron bid. Appetite continues. Hip is stiff after she sits for a period of time. Pain is not worse. Controlled with percocet.   Physical Findings: Weight: 135 lb 1.6 oz (61.281 kg). Walks with limp. Alert and oriented x 3.   Impression:  The patient is tolerating radiation.  Plan:  Continue treatment as planned. If hip continues to worsen consider films/ortho referral. Has had difficulties with compliance in past.

## 2012-03-16 ENCOUNTER — Ambulatory Visit
Admission: RE | Admit: 2012-03-16 | Discharge: 2012-03-16 | Disposition: A | Discharge status: Home / Self Care | Payer: Medicaid Other | Source: Ambulatory Visit | Attending: Radiation Oncology | Admitting: Radiation Oncology

## 2012-03-16 ENCOUNTER — Encounter: Payer: Self-pay | Admitting: *Deleted

## 2012-03-16 NOTE — Progress Notes (Signed)
[facility]  Clinical Social Work  Clinical Social Work received call from patient on 03/14/12 stating she did not have transportation to appointment today and was unable to ride bus due to her current physical condition.  CSW provided taxi voucher to and from the appointment and patient plans to use [facility] transportation for remainder of appointments.  CSW notified Merla Riches, financial advocate, to deduct costs from her [facility] fund.  Kathrin Penner, MSW, LCSW Clinical Social Worker [facility] [phone]

## 2012-03-17 ENCOUNTER — Ambulatory Visit (HOSPITAL_COMMUNITY): Payer: Medicaid Other

## 2012-03-18 ENCOUNTER — Telehealth: Payer: Self-pay | Admitting: *Deleted

## 2012-03-18 ENCOUNTER — Emergency Department (HOSPITAL_COMMUNITY)
Admission: EM | Admit: 2012-03-18 | Discharge: 2012-03-18 | Disposition: A | Discharge status: Home / Self Care | Payer: Medicaid Other | Attending: Emergency Medicine | Admitting: Emergency Medicine

## 2012-03-18 ENCOUNTER — Emergency Department (HOSPITAL_COMMUNITY): Payer: Medicaid Other

## 2012-03-18 ENCOUNTER — Ambulatory Visit: Payer: Medicaid Other

## 2012-03-18 ENCOUNTER — Encounter (HOSPITAL_COMMUNITY): Payer: Self-pay | Admitting: *Deleted

## 2012-03-18 ENCOUNTER — Other Ambulatory Visit: Payer: Self-pay | Admitting: Oncology

## 2012-03-18 DIAGNOSIS — C7951 Secondary malignant neoplasm of bone: Secondary | ICD-10-CM | POA: Insufficient documentation

## 2012-03-18 DIAGNOSIS — F17200 Nicotine dependence, unspecified, uncomplicated: Secondary | ICD-10-CM | POA: Insufficient documentation

## 2012-03-18 DIAGNOSIS — K219 Gastro-esophageal reflux disease without esophagitis: Secondary | ICD-10-CM | POA: Insufficient documentation

## 2012-03-18 DIAGNOSIS — C7952 Secondary malignant neoplasm of bone marrow: Secondary | ICD-10-CM | POA: Insufficient documentation

## 2012-03-18 DIAGNOSIS — C7931 Secondary malignant neoplasm of brain: Secondary | ICD-10-CM | POA: Insufficient documentation

## 2012-03-18 DIAGNOSIS — I10 Essential (primary) hypertension: Secondary | ICD-10-CM | POA: Insufficient documentation

## 2012-03-18 DIAGNOSIS — C50319 Malignant neoplasm of lower-inner quadrant of unspecified female breast: Secondary | ICD-10-CM | POA: Insufficient documentation

## 2012-03-18 DIAGNOSIS — M25559 Pain in unspecified hip: Secondary | ICD-10-CM | POA: Insufficient documentation

## 2012-03-18 MED ORDER — ONDANSETRON 4 MG PO TBDP
4.0000 mg | ORAL_TABLET | Freq: Once | ORAL | Status: AC
  Administered 2012-03-18: 4 mg via ORAL
  Filled 2012-03-18: qty 1

## 2012-03-18 MED ORDER — HYDROMORPHONE HCL 2 MG PO TABS
2.0000 mg | ORAL_TABLET | Freq: Once | ORAL | Status: AC
  Administered 2012-03-18: 2 mg via ORAL
  Filled 2012-03-18: qty 1

## 2012-03-18 MED ORDER — HYDROMORPHONE HCL 2 MG PO TABS: 2.0000 mg | ORAL_TABLET | ORAL | Status: DC | PRN

## 2012-03-18 MED ORDER — OXYCODONE-ACETAMINOPHEN 5-325 MG PO TABS
2.0000 | ORAL_TABLET | Freq: Once | ORAL | Status: AC
  Administered 2012-03-18: 2 via ORAL
  Filled 2012-03-18: qty 2

## 2012-03-18 MED ORDER — PROMETHAZINE HCL 25 MG PO TABS
25.0000 mg | ORAL_TABLET | Freq: Once | ORAL | Status: AC
  Administered 2012-03-18: 25 mg via ORAL
  Filled 2012-03-18: qty 1

## 2012-03-18 NOTE — Progress Notes (Signed)
Home care choice offered   HOME HEALTH AGENCIES SERVING [place]   Agencies that are Medicare-Certified and are affiliated with The [facility] Home Health Agency  Telephone Number Address  [facility]   The [facility] has ownership interest in this company; however, you are under no obligation to use this agency. [phone] or  [phone] [address] [place], [place] [address] [email or web address]   Agencies that are Medicare-Certified and are not affiliated with [facility] Agency Telephone Number Address  [facility] [phone] Fax [phone] [address] [place], [place]  [address] [email or web address]  [facility] [phone] or [phone] Fax [phone] [address] [place], [place] [address] [email or web address]  [facility] [phone] Fax [phone] [address] [place], [place] [address] [email or web address]  [facility] [phone] Fax [phone] [address] [place], [place]  [address] [email or web address]  [facility] [phone] Fax [phone] [address] [place], [place] [address] [email or web address]  [facility] [phone] [address] [place], [place] [address] [email or web address]  [facility] [phone]  [address] [place], [place] [address] [email or web address]  [facility] [phone] or [phone] Fax [phone] [address] [name], [place]  [address] [email or web address]  [facility] [phone] Fax [phone] [address] [place], [place]   [address]  [facility]  [phone] Fax [phone] [address] [place], [place] [address] [email or web address]

## 2012-03-18 NOTE — Progress Notes (Signed)
Cm spoke with Betsy at [facility] [phone] prompt 2) about referral for RW. Betsy provided fax number as [phone] [name] reports RW not delivered to hospital, choices are pt can pick up from [facility] office by 8 pm or delivery in mail to be received on Monday.

## 2012-03-18 NOTE — ED Notes (Signed)
Kim CM contacted per Dondra Spry EDNP's request. Pt needing walker to use at home.

## 2012-03-18 NOTE — Progress Notes (Signed)
Late entry from 03/15/2012 1145. Oriented patient to staff and flow of clinic. Provided patient with RADIATION THERAPY AND YOU handbook then, reviewed pertinent information. Educated patient on potential side effects and management such as fatigue, skin changes, etc. Provided patient with appointment calendar and this writers business card. Encouraged patient to call with needs.

## 2012-03-18 NOTE — Progress Notes (Signed)
Cm  Checked with other dme companies ([facility], [facility] and [facility]) but none unable to deliver RW to hospital

## 2012-03-18 NOTE — ED Provider Notes (Signed)
History     CSN: [id number]  Arrival date & time 03/18/12  1320   First MD Initiated Contact with Patient 03/18/12 1334      Chief Complaint  Patient presents with  . Leg Pain    (Consider location/radiation/quality/duration/timing/severity/associated sxs/prior treatment) HPI Comments: Patient with metastatic breast cancer to liver and bone 1 week ago.  Started receiving radiation therapy to brain, and right hip states that for the last 3, day she's had progressive weakness and difficulty walking, particularly on the right leg.  Today.  She states she is having trouble moving her right leg forward.  She took her last Percocet tablets.  Last night, and has had no pain control today.  She denies any incontinence of urine or bowel, decreased sensation in her right leg.  She has not contacted her primary care doctor about her increased pain in the fact that she is out of her pain control. She denies any recent trauma, falls.  Patient is a 46 y.o. female presenting with leg pain. The history is provided by the patient.  Leg Pain  The incident occurred more than 2 days ago. The incident occurred at home. There was no injury mechanism. The pain is present in the right hip. Pertinent negatives include no numbness.    Past Medical History  Diagnosis Date  . Hypertension   . GERD (gastroesophageal reflux disease)   . 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 Pyelonephritis 10/01/11  . Recurrent UTI (urinary tract infection) 10/01/11  . S/P chemotherapy, time since 4-12 weeks   . S/P radiation > 12 weeks   . 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 Brain cancer     spread from breast ca    Past Surgical History  Procedure Date  . Cesarean section 1989; 2010  . Mastectomy 2011    right  . Back surgery 2012    "removed tumor,cancer, from my spine  . Port-a-cath removal 2012    right chest  . Portacath placement 2011    right  . Tubal ligation 2010   . Breast biopsy 2011    right    Family History  Problem Relation Age of Onset  . Cancer Brother 45    died of colon cancer at age of 19    History  Substance Use Topics  . Smoking status: Current Every Day Smoker -- 0.2 packs/day for 31 years    Types: Cigarettes  . Smokeless tobacco: Never Used  . Alcohol Use: No    OB History    Grav Para Term Preterm Abortions TAB SAB Ect Mult Living                  Review of Systems  Constitutional: Negative for fever, chills, activity change and appetite change.  Respiratory: Negative for shortness of breath.   Genitourinary: Negative for dysuria, urgency and frequency.  Musculoskeletal: Positive for gait problem. Negative for back pain and joint swelling.  Neurological: Positive for weakness. Negative for numbness.    Allergies  Review of patient's allergies indicates no known allergies.  Home Medications   Current Outpatient Rx  Name Route Sig Dispense Refill  . AMLODIPINE BESYLATE 5 MG PO TABS Oral Take 5 mg by mouth daily.    . CEPHALEXIN 500 MG PO CAPS Oral Take 500 mg by mouth 4 (four) times daily.    . CYCLOBENZAPRINE HCL 10 MG PO TABS Oral Take  10 mg by mouth 3 (three) times daily as needed. For muscle spasms    . DEXAMETHASONE 4 MG PO TABS Oral Take 4 mg by mouth 2 (two) times daily with a meal.     . ADULT MULTIVITAMIN W/MINERALS CH Oral Take 1 tablet by mouth daily.    [name] OMEPRAZOLE 20 MG PO CPDR Oral Take 20 mg by mouth daily.    [name] PROMETHAZINE HCL 25 MG PO TABS Oral Take 12.5-25 mg by mouth every 6 (six) hours as needed. Nausea    . HYDROMORPHONE HCL 2 MG PO TABS Oral Take 1 tablet (2 mg total) by mouth every 4 (four) hours as needed for pain. 30 tablet 0    BP 109/70  Pulse 78  Temp 98.1 F (36.7 C) (Oral)  Resp 18  SpO2 99%  LMP 03/02/2011  Physical Exam  Constitutional: She appears well-developed and well-nourished.  Eyes: Pupils are equal, round, and reactive to light.  Neck: Normal range of motion.   Cardiovascular: Normal rate.   Pulmonary/Chest: Effort normal.  Abdominal: Soft. She exhibits no distension.  Genitourinary:       Denise incontinence of bowel or bladder   Musculoskeletal: She exhibits no edema and no tenderness.       Pateint is able to flex at hip, knee, and  ankle, wiggly toes no decrease in sensation, no change in color or temperature of R leg,  Neurological: She is alert.  Skin: Skin is warm.    ED Course  Procedures (including critical care time)  Labs Reviewed - No data to display Dg Lumbar Spine Complete  03/18/2012  *RADIOLOGY REPORT*  Clinical Data: Leg pain  LUMBAR SPINE - COMPLETE 4+ VIEW  Comparison: 10/01/2011 CT  Findings: Multilevel vertebral body and sacrum sclerotic foci are in keeping with the known history of metastatic disease. Vertebral body height and alignment maintained.  Overlying soft tissues unremarkable.  Calcified fibroid noted.  Mild lower lumbar degenerative change.  IMPRESSION: Multilevel osseous metastases.  No acute osseous finding.   Original Report Authenticated By: Waneta Martins, M.D.    Dg Hip Complete Right  03/18/2012  *RADIOLOGY REPORT*  Clinical Data: Right hip pain,  RIGHT HIP - COMPLETE 2+ VIEW  Comparison: 02/02/2012  Findings: The intertrochanteric femur demonstrates a permeative appearance with adjacent areas of sclerosis.  Given the known metastatic osseous disease elsewhere, metastatic disease is of concern. No displaced fracture is evident.  No dislocation. Overlying soft tissues unremarkable. There is a calcified fibroid.  IMPRESSION: Right intertrochanteric lesion may represent a metastasis. No displaced fracture identified. MRI again recommended.   Original Report Authenticated By: Waneta Martins, M.D.      1. Metastatic cancer to bone   2. Hip pain       MDM  Will xray Pateint will need home walker to assist in ambulation  After patient received by mouth Tylenol at 2, milligrams she's feeling much  better.  She is waiting for her sister to pick up the walker, and she will be discharged home.  Followup with Dr. Arlice Colt on Monday for continuation of her radiation treatments Sisters arrived with a walker.  Patient will follow through with plan for increased pain control, and continue radiation therapy on Monday       Arman Filter, NP 03/18/12 1813  Arman Filter, NP 03/18/12 2013  Arman Filter, NP 03/18/12 2014

## 2012-03-18 NOTE — Addendum Note (Signed)
Encounter addended by: Agnes Lawrence, RN on: 03/18/2012  6:39 PM<BR>     Documentation filed: Inpatient Patient Education, Inpatient Document Flowsheet, Notes Section

## 2012-03-18 NOTE — Progress Notes (Signed)
[facility] ED CM consulted to assist patient with Rolling walker (rw) for d/c CM spoke with pt who states she is medicaid pt who has not obtained a rw at all previously Pt choice in home health [facility] 5'2" wt 134 pounds

## 2012-03-18 NOTE — Telephone Encounter (Signed)
Patient called stating she was to have a 530pm rad tx today but right now is in the ED "i can't hardly walk and in pain",  Wanted to ask if she wasn't admitted could she come for treatment, transferred call to [place] #1,no answer, informed patient I would walk back there and let the therapist know her status and notify Dr.Wentworth and Percell Boston, patient stated she would call back later 3:25 PM

## 2012-03-18 NOTE — ED Notes (Addendum)
Pt comes into the ED with c/o pain states that she was diagnosed with cancer October 2010. Started as breast cancer and it metastasized to bone cancer. Pt is seen by Dr. Arlice Colt. Comes in today stating that she was unable to walk since yesterday and increased weakness.

## 2012-03-18 NOTE — Progress Notes (Signed)
Text to sister ([facility] address, number, contact person) completed to sister, [name] Pt previously seen by [facility] per medicaid card.  The facility closed 01/29/12  Cm provided pt with a list of other available medicaid providers and self pay providers CM encouraged pt to have DSS case worker re assign her a new pcp to place on medicaid card or in DSS system to allow her to be seen by another pcp

## 2012-03-18 NOTE — Progress Notes (Signed)
CM spoke with Tamela Oddi to confirm [facility] not [facility] on saturdays.  Pt requested cm call and speak with sister asia at [phone] to inquire if she will go to [facility] [address] to pick up rolling walker Cm will fax the address, contact number and contact person name to [place] via pt cell phone (that only allows texting) CM received fax confirmation at 1818 that [facility] received face sheet, NP progress notes Order for Rolling walker, Dx and ht/wt for pt

## 2012-03-19 NOTE — ED Provider Notes (Signed)
Medical screening examination/treatment/procedure(s) were performed by non-physician practitioner and as supervising physician I was immediately available for consultation/collaboration.  Dorthey Depace, MD 03/19/12 0712 

## 2012-03-21 ENCOUNTER — Other Ambulatory Visit: Payer: Self-pay | Admitting: *Deleted

## 2012-03-21 ENCOUNTER — Ambulatory Visit
Admission: RE | Admit: 2012-03-21 | Discharge: 2012-03-21 | Disposition: A | Discharge status: Home / Self Care | Payer: Medicaid Other | Source: Ambulatory Visit | Attending: Radiation Oncology | Admitting: Radiation Oncology

## 2012-03-21 MED ORDER — OXYCODONE-ACETAMINOPHEN 10-325 MG PO TABS: 1.0000 | ORAL_TABLET | Freq: Four times a day (QID) | ORAL | Status: DC | PRN

## 2012-03-22 ENCOUNTER — Telehealth: Payer: Self-pay | Admitting: *Deleted

## 2012-03-22 ENCOUNTER — Other Ambulatory Visit (HOSPITAL_BASED_OUTPATIENT_CLINIC_OR_DEPARTMENT_OTHER): Payer: Medicaid Other

## 2012-03-22 ENCOUNTER — Ambulatory Visit
Admission: RE | Admit: 2012-03-22 | Discharge: 2012-03-22 | Disposition: A | Discharge status: Home / Self Care | Payer: Medicaid Other | Source: Ambulatory Visit | Attending: Radiation Oncology | Admitting: Radiation Oncology

## 2012-03-22 ENCOUNTER — Other Ambulatory Visit: Payer: Self-pay | Admitting: *Deleted

## 2012-03-22 ENCOUNTER — Ambulatory Visit (HOSPITAL_BASED_OUTPATIENT_CLINIC_OR_DEPARTMENT_OTHER): Payer: Medicaid Other | Admitting: Physician Assistant

## 2012-03-22 ENCOUNTER — Encounter: Payer: Self-pay | Admitting: Physician Assistant

## 2012-03-22 VITALS — BP 164/82 | HR 97 | Temp 98.4°F | Resp 20 | Ht 62.0 in | Wt 138.9 lb

## 2012-03-22 DIAGNOSIS — C787 Secondary malignant neoplasm of liver and intrahepatic bile duct: Secondary | ICD-10-CM

## 2012-03-22 DIAGNOSIS — C7951 Secondary malignant neoplasm of bone: Secondary | ICD-10-CM

## 2012-03-22 DIAGNOSIS — C50919 Malignant neoplasm of unspecified site of unspecified female breast: Secondary | ICD-10-CM

## 2012-03-22 DIAGNOSIS — C7931 Secondary malignant neoplasm of brain: Secondary | ICD-10-CM

## 2012-03-22 DIAGNOSIS — C7949 Secondary malignant neoplasm of other parts of nervous system: Secondary | ICD-10-CM

## 2012-03-22 DIAGNOSIS — K219 Gastro-esophageal reflux disease without esophagitis: Secondary | ICD-10-CM

## 2012-03-22 DIAGNOSIS — C7952 Secondary malignant neoplasm of bone marrow: Secondary | ICD-10-CM

## 2012-03-22 LAB — CBC WITH DIFFERENTIAL/PLATELET
BASO%: 0.2 % (ref 0.0–2.0)
EOS%: 0.1 % (ref 0.0–7.0)
HCT: 35.4 % (ref 34.8–46.6)
LYMPH%: 11.2 % — ABNORMAL LOW (ref 14.0–49.7)
MCH: 30.3 pg (ref 25.1–34.0)
MCHC: 33.8 g/dL (ref 31.5–36.0)
MONO%: 4.9 % (ref 0.0–14.0)
NEUT%: 83.6 % — ABNORMAL HIGH (ref 38.4–76.8)
Platelets: 234 10*3/uL (ref 145–400)

## 2012-03-22 LAB — COMPREHENSIVE METABOLIC PANEL (CC13)
ALT: 65 U/L — ABNORMAL HIGH (ref 0–55)
AST: 29 U/L (ref 5–34)
Creatinine: 0.9 mg/dL (ref 0.6–1.1)
Total Bilirubin: 0.4 mg/dL (ref 0.20–1.20)

## 2012-03-22 NOTE — Progress Notes (Signed)
ID: [name]   DOB: [birth year]  MR#: [id number]  CSN#:[id number]  HISTORY OF PRESENT ILLNESS: The patient delivered a child on 05/17/2009, after which she felt fullness in the right breast. She eventually brought this to her doctors attention and was scheduled for mammography and ultrasound on 03/08/2009. There were no prior exams for comparison. Mammography showed the breast to be dense, with a 5 cm obscured mass in the outer midportion of the right breast. Mass was easily palpable, and there was also a mass in the outer right periareolar region. They were suspicious microcalcifications in the outer midportion of the right breast, as well as within the mass itself. Ultrasound showed an extensive heterogeneous area, corresponding to the palpable abnormality with multiple solid and cystic foci. Overall, the mass measured in excess of 5 cm, in addition to cysts in the breast.  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 The tumors were ER +100%, PR +80%.  Bilateral breast MRI was obtained 03/22/2009. There was a large area of abnormal enhancement in the lower outer quadrant of the right breast measuring up to 9.7 cm. No abnormal appearing lymph nodes and no other masses in either breast.  Patient underwent definitive right mastectomy and sentinel lymph node sampling on 04/29/2009 under the care of [name]. 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 Repeat prognostic panel showed tumor to be ER +93% and PR +51%. HER-2/neu was negative with a ratio of 0.71, with proliferation marker of 36%.  Patient was evaluated by [name] in January 2011 but failed to keep appointments until she was seen here again in July 2011. In July, she began adjuvant chemotherapy. She received 4 cycles of dose dense  doxorubicin and cyclophosphamide followed by 4 weekly doses of paclitaxel which was discontinued in November 2011 secondary to peripheral neuropathy. The patient then received radiation therapy to the right chest wall which was truncated due to problems with missing appointments.  Patient was started on tamoxifen briefly in June of 2012 which was discontinued due to nausea. The nausea continued, and an abdominal CT in October 2012 confirmed a growth in liver lesions. These were biopsied on 03/10/2011 and showed metastatic adenocarcinoma, still ER positive at 100%, PR +5%. Her subsequent treatments are as detailed below  INTERVAL HISTORY: [name] returns today  for followup of her metastatic breast carcinoma. She continues to see [name] and is receiving palliative radiation, scheduled to be completed next week on October 30. She continues on dexamethasone, currently 4 mg by mouth twice a day per her report. Her vision has improved slightly, although she begins to notice some mild diplopia "when it is time to take the dexamethasone". She is using a walker to assist with ambulation, and continues to have significant pain in the right hip and right leg. She continues on oxycodone/APAP with some relief.   REVIEW OF SYSTEMS: [name] has had no fevers or chills. She has occasional nausea but no emesis. She continues to gain weight due to the steroids. She's having regular bowel movements. She does tend alternating somewhat between mild constipation and mild diarrhea which is not new. She tends to have some reflux for which she takes omeprazole. She has had no increased cough or increased shortness of breath. No chest pain.  Detailed review of systems is otherwise stable and noncontributory.   PAST  MEDICAL HISTORY: Past Medical History  Diagnosis Date  . Hypertension   . GERD (gastroesophageal reflux disease)   . 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 Pyelonephritis 10/01/11  .  Recurrent UTI (urinary tract infection) 10/01/11  . S/P chemotherapy, time since 4-12 weeks   . S/P radiation > 12 weeks   . 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 Brain cancer     spread from breast ca    PAST SURGICAL HISTORY: Past Surgical History  Procedure Date  . Cesarean section 1989; 2010  . Mastectomy 2011    right  . Back surgery 2012    "removed tumor,cancer, from my spine  . Port-a-cath removal 2012    right chest  . Portacath placement 2011    right  . Tubal ligation 2010  . Breast biopsy 2011    right    FAMILY HISTORY The patient's father is alive at age 77.  The patient's mother is alive at age 65.  She has four sisters.  She had one brother who died from colon cancer at the age of 36.  There is no breast or ovarian cancer history in the family to her knowledge.  GYNECOLOGIC HISTORY: She is GX P2.  First pregnancy to term at age 34.    SOCIAL HISTORY: The patient is not employed.  She lives by herself with her small son whose name is [name], [name].  Her first child, [name], is studying business in this area.  The patient has one grandchild.  She attends the [employer].    ADVANCED DIRECTIVES: not in place  HEALTH MAINTENANCE: History  Substance Use Topics  . Smoking status: Current Every Day Smoker -- 0.2 packs/day for 31 years    Types: Cigarettes  . Smokeless tobacco: Never Used  . Alcohol Use: No     Colonoscopy:  PAP:  Bone density:  Lipid panel:  No Known Allergies  Current Outpatient Prescriptions  Medication Sig Dispense Refill  . amLODipine (NORVASC) 5 MG tablet Take 5 mg by mouth daily.      . cephALEXin (KEFLEX) 500 MG capsule Take 500 mg by mouth 4 (four) times daily.      . cyclobenzaprine (FLEXERIL) 10 MG tablet Take 10 mg by mouth 3 (three) times daily as needed. For muscle spasms      . dexamethasone (DECADRON) 4 MG tablet Take 4 mg by mouth 2 (two) times daily with a meal.       . HYDROmorphone  (DILAUDID) 2 MG tablet Take 1 tablet (2 mg total) by mouth every 4 (four) hours as needed for pain.  30 tablet  0  . Multiple Vitamin (MULTIVITAMIN WITH MINERALS) TABS Take 1 tablet by mouth daily.      [name] omeprazole (PRILOSEC) 20 MG capsule Take 20 mg by mouth daily.      [name] oxyCODONE-acetaminophen (PERCOCET) 10-325 MG per tablet Take 1-2 tablets by mouth every 6 (six) hours as needed.  120 tablet  0  . promethazine (PHENERGAN) 25 MG tablet Take 12.5-25 mg by mouth every 6 (six) hours as needed. Nausea      . DISCONTD: Alum & Mag Hydroxide-Simeth (MAGIC MOUTHWASH W/LIDOCAINE) SOLN Take 5 mLs by mouth 3 (three) times daily as needed (take 15 minutes prior to meals.).  480 mL  0    OBJECTIVE: Middle-aged [place] American woman who appears uncomfortable and slightly anxious Filed Vitals:   03/22/12 1540  BP: 164/82  Pulse: 97  Temp: 98.4 F (36.9 C)  Resp: 20     Body mass index is 25.41 kg/(m^2).    ECOG FS:2   Filed Weights   03/22/12 1540  Weight: 138 lb 14.4 oz (63.005 kg)   Remainder of physical exam was deferred today.   LAB RESULTS: Lab Results  Component Value Date   WBC 7.6 03/22/2012   NEUTROABS 6.4 03/22/2012   HGB 12.0 03/22/2012   HCT 35.4 03/22/2012   MCV 89.7 03/22/2012   PLT 234 03/22/2012      Chemistry      Component Value Date/Time   NA 139 03/22/2012 1456   NA 139 03/13/2012 1215   K 3.7 03/22/2012 1456   K 3.9 03/13/2012 1215   CL 103 03/22/2012 1456   CL 101 03/13/2012 1215   CO2 25 03/22/2012 1456   CO2 25 03/13/2012 1215   BUN 25.0 03/22/2012 1456   BUN 16 03/13/2012 1215   CREATININE 0.9 03/22/2012 1456   CREATININE 0.91 03/13/2012 1215      Component Value Date/Time   CALCIUM 9.2 03/22/2012 1456   CALCIUM 9.2 03/13/2012 1215   ALKPHOS 176* 03/22/2012 1456   ALKPHOS 152* 12/30/2011 1304   AST 29 03/22/2012 1456   AST 24 12/30/2011 1304   ALT 65* 03/22/2012 1456   ALT 14 12/30/2011 1304   BILITOT 0.40 03/22/2012 1456   BILITOT 0.4  12/30/2011 1304       Lab Results  Component Value Date   LABCA2 21 09/09/2011    STUDIES: Dg Chest 2 View  02/15/2012  *RADIOLOGY REPORT*  Clinical Data: Chest pain headache  CHEST - 2 VIEW  Comparison: 10/01/2011  Findings: Right mastectomy changes.  Pulmonary hyperinflation is present compatible with chronic lung disease.  Negative for infiltrate or effusion.  No mass lesion or heart failure.  Nipple shadow on the left again noted. Sclerotic lesions in the thoracic spine compatible with metastatic disease.  IMPRESSION: No acute abnormality.   Original Report Authenticated By: Camelia Phenes, M.D.    Dg Scapula Left  02/02/2012  *RADIOLOGY REPORT*  Clinical Data: History of breast cancer, now with left posterior shoulder pain, no known injury  LEFT SCAPULA - 2+ VIEWS  Comparison: PET CT - 01/06/2010  Findings: No definite fracture or dislocation.  No definite aggressive osseous lesions.  Limited visualization of the adjacent thorax is normal.  Regional soft tissues are normal.  IMPRESSION: No explanation for patient's right posterior shoulder pain.   Original Report Authenticated By: Waynard Reeds, M.D.    Dg Hip Complete Right  02/02/2012  *RADIOLOGY REPORT*  Clinical Data: Mid thigh pain.  No known injury.  History of breast cancer per EMR.  RIGHT HIP - COMPLETE 2+ VIEW  Comparison: Pelvic CT 10/01/2011.  Findings: There is ill-defined lucency within the intertrochanteric region which is similar to the prior CT examination. There is questionable minimal cortical irregularity medially near the lesser trochanter.  No displaced fracture is identified.  There are no blastic lesions.  There is no evidence of dislocation.  The hip joint spaces are preserved.  IMPRESSION: Ill-defined lucency within the right femoral neck appears similar to prior CT, and no definite pathologic fracture is identified. However, given the patient's history, metastatic disease cannot be completely excluded.  If the patient  has persistent hip pain or inability to bear weight, this would be best further assessed with non emergent MRI.   Original Report Authenticated By:  Gerrianne Scale, M.D.    Dg Femur Right  02/02/2012  *RADIOLOGY REPORT*  Clinical Data: Mid right thigh pain.  No known injury.  History of breast cancer according to EMR.  RIGHT FEMUR - 2 VIEW  Comparison: Abdominal pelvic CT 10/01/2011.  Findings: There is ill-defined lucency within the intertrochanteric region which appears similar to the prior CT.  No displaced fracture or blastic lesion is seen within the femur.  The CT did show blastic metastases throughout the spine.  IMPRESSION: No evidence of acute fracture or dislocation.  Ill-defined lucency within the intertrochanteric region could reflect underlying metastasis or incidental benign lesion.   Original Report Authenticated By: Gerrianne Scale, M.D.    Ct Head W Wo Contrast  02/15/2012  *RADIOLOGY REPORT*  Clinical Data: Headaches.  History of breast cancer.  CT HEAD WITHOUT AND WITH CONTRAST  Technique:  Contiguous axial images were obtained from the base of the skull through the vertex without and with intravenous contrast.  Contrast: OMNIPAQUE IOHEXOL 300 MG/ML  SOLN  Comparison: None.  Findings: The ventricles are normal.  No extra-axial fluid collections are seen.  The brainstem and cerebellum are unremarkable.  No acute intracranial findings such as infarction or hemorrhage.  No mass lesions.  The bony calvarium is intact.  The visualized paranasal sinuses and mastoid air cells are clear.  IMPRESSION: No CT findings for acute intracranial abnormality or metastatic disease.   Original Report Authenticated By: P. Loralie Champagne, M.D.    Mr [name] ZO Contrast  02/20/2012  *RADIOLOGY REPORT*  Clinical Data: Breast cancer.  High blood pressure.  Severe headaches increased double vision.  MRI HEAD WITHOUT AND WITH CONTRAST  Technique:  Multiplanar, multiecho pulse sequences of the brain and  surrounding structures were obtained according to standard protocol without and with intravenous contrast  Contrast: 10mL MULTIHANCE GADOBENATE DIMEGLUMINE 529 MG/ML IV SOLN  Comparison: None.  Findings: Destructive lesion of the clivus with extension towards / into the cavernous sinus region with pituitary gland displaced superiorly with buckling of the infundibulum consistent with metastatic disease. Slight impression upon the undersurface of the optic chiasm/proximal optic nerves by the superior displaced pituitary gland.  Metastatic involvement of the cervical spine suspected although incompletely assessed.  No brain parenchymal enhancing lesion.  No acute infarct.  No intracranial hemorrhage.  Major intracranial vascular structures are patent.  Very mild nonspecific white matter type changes.  No hydrocephalus.  Minimal mucosal thickening ethmoid sinus air cells and mastoid air cells.  IMPRESSION:  Destructive lesion of the clivus with extension towards / into the cavernous sinus region with pituitary gland displaced superiorly with buckling of the infundibulum consistent with metastatic disease. Slight impression upon the undersurface of the optic chiasm/proximal optic nerves by the superior displaced pituitary gland.  Metastatic involvement of the cervical spine suspected although incompletely assessed.  No brain parenchymal enhancing lesion.  Critical Value/emergent results were called by telephone at the time of interpretation on 02/20/2012 at 6:45 p.m. to Dr. Myna Hidalgo, who verbally acknowledged these results.   Original Report Authenticated By: Fuller Canada, M.D.     ASSESSMENT:   46 y.o.  [name] woman    1. 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    2.Status post adjuvant chemotherapy July to November  2011, consisting of 4 cycles of dose dense  doxorubicin and cyclophosphamide, followed by 4 of 12 planned weekly doses of paclitaxel given adjuvantly, discontinued secondary to peripheral neuropathy.    3. Status post radiation to the right chest wall, truncated due to problems with the patient missing appointments.   4. Tamoxifen started June of 2012, but discontinued due to nausea.    5. metastatic disease to liver and bone pathologically documented October of 2012, estrogen receptor 100% positive, progesterone receptor 7% positive, with no HER-2 amplification;  treatment in the metastatic setting has consisted of:   6.Faslodex since 03/16/2011, Zoladex every 3 months also started on 03/16/2011, and monthly zoledronic acid started November 2011, recently held due to pending dental procedures and possible extractions.             7. Status post thoracic laminectomy, T9-T11, for impending cord compression on 03/21/2011, followed by irradiation to the spine completed November 2011  8. Brain MRI 02/20/2012 showed a destructive clivus lesion with extension into the car no sinus, compressing the optic chiasm resulting in left diet diplopia  9. currently receiving palliative radiation to the clivus as well as the right hip     PLAN:  Our entire 20 minute appointment today was spent reviewing [name]'s side effects, reviewing her medications, discussing our plan of care, and coordinating care.    [name] will continue with radiation therapy to the clivus and the right hip through [name], and will complete therapy as scheduled next week on October 30. Since she was unable to keep her previous CT appointment, we will reschedule that and hope to obtain CTs of the chest, abdomen, and pelvis later this week.   She'll see [place] again next week to review those results and discuss her treatment plan. Per [name] plan, if we document progression in the liver we will consider switching to  exemestane/ everolimus.  [name] voices understanding and agreement with our plan and will call with any changes or problems.   [name]    03/22/2012

## 2012-03-22 NOTE — Telephone Encounter (Signed)
Gave patient appointment for ct cap on 03-24-2012 arrival time 2:15pm before the patient goes to her radiation treatment  Per the orders placed patient on md calendar for 03-29-2012 even though the orders state 03-30-2012 the md called and said the orders should have said 03-29-2012 patient should be having labs at 12;00pm but no open slots to put the patient on the lab schedule had to place the patient on for 11:45am for the lab appointment on 03-29-2012

## 2012-03-23 ENCOUNTER — Other Ambulatory Visit: Payer: Medicaid Other | Admitting: Lab

## 2012-03-23 ENCOUNTER — Telehealth: Payer: Self-pay | Admitting: *Deleted

## 2012-03-23 ENCOUNTER — Ambulatory Visit: Payer: Medicaid Other

## 2012-03-23 ENCOUNTER — Ambulatory Visit
Admission: RE | Admit: 2012-03-23 | Discharge: 2012-03-23 | Disposition: A | Discharge status: Home / Self Care | Payer: Medicaid Other | Source: Ambulatory Visit | Attending: Radiation Oncology | Admitting: Radiation Oncology

## 2012-03-23 NOTE — Telephone Encounter (Signed)
Therapist reported that [name] did not show up for treatment and that no one is answering her home phone.  No ED visit evident in Epic nor are there any notes to review Called and got voicemail and requested she or her family call.  [name] is receiving Radiation to the clivus and her right hip.  Will continue to try to reach her and will notify Dr. Michell Heinrich.

## 2012-03-24 ENCOUNTER — Ambulatory Visit (HOSPITAL_COMMUNITY)
Admission: RE | Admit: 2012-03-24 | Discharge: 2012-03-24 | Disposition: A | Discharge status: Home / Self Care | Payer: Medicaid Other | Source: Ambulatory Visit | Attending: Physician Assistant | Admitting: Physician Assistant

## 2012-03-24 ENCOUNTER — Ambulatory Visit
Admission: RE | Admit: 2012-03-24 | Discharge: 2012-03-24 | Disposition: A | Discharge status: Home / Self Care | Payer: Medicaid Other | Source: Ambulatory Visit | Attending: Radiation Oncology | Admitting: Radiation Oncology

## 2012-03-24 ENCOUNTER — Ambulatory Visit: Payer: Medicaid Other

## 2012-03-24 ENCOUNTER — Encounter: Payer: Self-pay | Admitting: Radiation Oncology

## 2012-03-24 VITALS — BP 134/77 | HR 108 | Temp 98.6°F | Resp 20 | Wt 142.0 lb

## 2012-03-24 DIAGNOSIS — C7951 Secondary malignant neoplasm of bone: Secondary | ICD-10-CM

## 2012-03-24 DIAGNOSIS — R600 Localized edema: Secondary | ICD-10-CM

## 2012-03-24 DIAGNOSIS — C50919 Malignant neoplasm of unspecified site of unspecified female breast: Secondary | ICD-10-CM

## 2012-03-24 NOTE — Progress Notes (Signed)
After finishing, noticed patient right leg swelling and ankle, patient states startedswelling past few days, and feels that's why her hip is so stiff today, thinks she is getting a cold 3:38 PM

## 2012-03-24 NOTE — Progress Notes (Signed)
Patient here via w/c after rad tx 5/10 right hip/clovus, alert,oriented x3, stillon decadron 4mg  po bid, dilaudid 2mg  po prn for pain alternating with percocet po,  prn, requesting refill on dilaudid, no nausea, has had some blurry vision today, blows nose creamy greenish yellow, sniffling, no head aches, eating and drinking plenty water, right hip still stiff  3:36 PM

## 2012-03-24 NOTE — Progress Notes (Signed)
Weekly Management Note Current Dose:  15 Gy  Projected Dose: 30 Gy   Narrative:  The patient presents for routine under treatment assessment.  CBCT/MVCT images/Port film x-rays were reviewed.  The chart was checked.Doing well. Pain is somewhat better. Swelling in bilateral LE at the end of the day right > left.  Leg continues to be stiff.  Diplopia better. Would like to try to decrease steroid dose.   Physical Findings: Weight: 142 lb (64.411 kg). Unchanged. Decrease lateral rectus muscle on right eye. 2+ NP edema on right leg. minmal edema on left.   Impression:  The patient is tolerating radiation.  Plan:  Continue treatment as planned. Right LE doppler tomorrow before treatment (patient's ride is here and she can't stay for one today). Decrease to 1/2 tab of decadron at night and full tab in am.

## 2012-03-25 ENCOUNTER — Telehealth: Payer: Self-pay | Admitting: *Deleted

## 2012-03-25 ENCOUNTER — Ambulatory Visit (HOSPITAL_COMMUNITY): Payer: Medicaid Other | Attending: Radiation Oncology

## 2012-03-25 ENCOUNTER — Ambulatory Visit: Payer: Medicaid Other

## 2012-03-25 ENCOUNTER — Ambulatory Visit
Admission: RE | Admit: 2012-03-25 | Discharge: 2012-03-25 | Disposition: A | Discharge status: Home / Self Care | Payer: Medicaid Other | Source: Ambulatory Visit | Attending: Radiation Oncology | Admitting: Radiation Oncology

## 2012-03-25 NOTE — Telephone Encounter (Signed)
Called patient to inform of test, spoke with patient and she is aware of this test. 

## 2012-03-28 ENCOUNTER — Ambulatory Visit: Payer: Medicaid Other

## 2012-03-28 ENCOUNTER — Ambulatory Visit (HOSPITAL_COMMUNITY)
Admission: RE | Admit: 2012-03-28 | Discharge: 2012-03-28 | Disposition: A | Discharge status: Home / Self Care | Payer: Medicaid Other | Source: Ambulatory Visit | Attending: Radiation Oncology | Admitting: Radiation Oncology

## 2012-03-28 ENCOUNTER — Ambulatory Visit
Admission: RE | Admit: 2012-03-28 | Discharge: 2012-03-28 | Disposition: A | Discharge status: Home / Self Care | Payer: Medicaid Other | Source: Ambulatory Visit | Attending: Radiation Oncology | Admitting: Radiation Oncology

## 2012-03-28 DIAGNOSIS — M7989 Other specified soft tissue disorders: Secondary | ICD-10-CM | POA: Insufficient documentation

## 2012-03-28 DIAGNOSIS — R609 Edema, unspecified: Secondary | ICD-10-CM

## 2012-03-28 DIAGNOSIS — M79609 Pain in unspecified limb: Secondary | ICD-10-CM

## 2012-03-28 NOTE — Progress Notes (Signed)
*  PRELIMINARY RESULTS* Vascular Ultrasound Right lower extremity venous duplex has been completed.  Preliminary findings: Right:  No evidence of DVT, superficial thrombosis, or Baker's cyst.  Called report to Dr. Basilio Cairo who will relay results to Dr. Michell Heinrich.  Farrel Demark, RDMS, RVT 03/28/2012, 5:08 PM

## 2012-03-29 ENCOUNTER — Ambulatory Visit: Payer: Medicaid Other

## 2012-03-29 ENCOUNTER — Ambulatory Visit: Payer: Medicaid Other | Admitting: Oncology

## 2012-03-29 ENCOUNTER — Encounter: Payer: Self-pay | Admitting: Radiation Oncology

## 2012-03-29 ENCOUNTER — Other Ambulatory Visit (HOSPITAL_BASED_OUTPATIENT_CLINIC_OR_DEPARTMENT_OTHER): Payer: Medicaid Other | Admitting: Lab

## 2012-03-29 ENCOUNTER — Other Ambulatory Visit: Payer: Self-pay | Admitting: *Deleted

## 2012-03-29 ENCOUNTER — Encounter: Payer: Self-pay | Admitting: *Deleted

## 2012-03-29 ENCOUNTER — Telehealth: Payer: Self-pay | Admitting: Oncology

## 2012-03-29 ENCOUNTER — Ambulatory Visit
Admission: RE | Admit: 2012-03-29 | Discharge: 2012-03-29 | Disposition: A | Discharge status: Home / Self Care | Payer: Medicaid Other | Source: Ambulatory Visit | Attending: Radiation Oncology | Admitting: Radiation Oncology

## 2012-03-29 VITALS — BP 126/63 | HR 91 | Temp 98.3°F | Resp 20 | Wt 143.0 lb

## 2012-03-29 DIAGNOSIS — C50919 Malignant neoplasm of unspecified site of unspecified female breast: Secondary | ICD-10-CM

## 2012-03-29 DIAGNOSIS — C787 Secondary malignant neoplasm of liver and intrahepatic bile duct: Secondary | ICD-10-CM

## 2012-03-29 DIAGNOSIS — C7951 Secondary malignant neoplasm of bone: Secondary | ICD-10-CM

## 2012-03-29 DIAGNOSIS — C801 Malignant (primary) neoplasm, unspecified: Secondary | ICD-10-CM

## 2012-03-29 LAB — CBC WITH DIFFERENTIAL/PLATELET
BASO%: 0.2 % (ref 0.0–2.0)
Basophils Absolute: 0 10*3/uL (ref 0.0–0.1)
HCT: 32.7 % — ABNORMAL LOW (ref 34.8–46.6)
LYMPH%: 11.3 % — ABNORMAL LOW (ref 14.0–49.7)
MCH: 30.4 pg (ref 25.1–34.0)
MCHC: 34.1 g/dL (ref 31.5–36.0)
MONO#: 0.3 10*3/uL (ref 0.1–0.9)
NEUT%: 84.2 % — ABNORMAL HIGH (ref 38.4–76.8)
Platelets: 225 10*3/uL (ref 145–400)

## 2012-03-29 LAB — COMPREHENSIVE METABOLIC PANEL (CC13)
ALT: 46 U/L (ref 0–55)
BUN: 14 mg/dL (ref 7.0–26.0)
CO2: 28 mEq/L (ref 22–29)
Creatinine: 0.7 mg/dL (ref 0.6–1.1)
Total Bilirubin: 0.34 mg/dL (ref 0.20–1.20)

## 2012-03-29 MED ORDER — OMEPRAZOLE 20 MG PO CPDR: 20.0000 mg | DELAYED_RELEASE_CAPSULE | Freq: Every day | ORAL | Status: DC

## 2012-03-29 MED ORDER — PROMETHAZINE HCL 25 MG PO TABS: 12.5000 mg | ORAL_TABLET | Freq: Four times a day (QID) | ORAL | Status: DC | PRN

## 2012-03-29 MED ORDER — AMLODIPINE BESYLATE 5 MG PO TABS: 5.0000 mg | ORAL_TABLET | Freq: Every day | ORAL | Status: DC

## 2012-03-29 MED ORDER — DEXAMETHASONE 4 MG PO TABS: ORAL_TABLET | ORAL | Status: DC

## 2012-03-29 MED ORDER — CYCLOBENZAPRINE HCL 10 MG PO TABS: 10.0000 mg | ORAL_TABLET | Freq: Three times a day (TID) | ORAL | Status: DC | PRN

## 2012-03-29 MED ORDER — OXYCODONE-ACETAMINOPHEN 10-325 MG PO TABS: 1.0000 | ORAL_TABLET | Freq: Four times a day (QID) | ORAL | Status: DC | PRN

## 2012-03-29 NOTE — Telephone Encounter (Signed)
S/w val and she stated that the pt will be coming in tomorrow to get her contrast and the scan appts.

## 2012-03-29 NOTE — Progress Notes (Signed)
Weekly Management Note Current Dose: 24  Gy  Projected Dose:30  Gy   Narrative:  The patient presents for routine under treatment assessment.  CBCT/MVCT images/Port film x-rays were reviewed.  The chart was checked. Pain in hip better after large BM.  States it is getting worse. Taking 1 full decadron in am and 1/2 in pm. Vision stable but seems to worsen before next dose is due. Sometimes she only takes 1/2 tablet per day. Afraid she missed scans. Has appt with magrinat following this. Also has labs.   Physical Findings: Weight: 143 lb (64.864 kg). Extraocular muscles on left seem to be better. More lateral gaze in left eye. In a wheelchair.   Impression:  The patient is tolerating radiation.  Plan:  Continue treatment as planned. Discussed possible MRI to eval hip for fracture but again compliance is an issue. If he is getting MRI for liver disease, perhaps we can add on a pelvis. F/u in 1 month. Taper decadron to 1/2 tab in am and 1/2 in pm. Call if worsening visual symptoms. Schedule eye doctor appt in 1 month to maximize visual improvement from RT.

## 2012-03-29 NOTE — Progress Notes (Addendum)
Pt denies pain today, states she's "feeling a lot better". She had 2 large bm's yesterday. Denies loss of appetite. Pt's doppler negative per her report. Pt states she needs refills on all her meds. She was "over her sink with her bag of meds trying to open her BP med. She dropped her meds into dish water in sink." Pt has appt for lab and w/Dr Magrinat after seeing Dr Michell Heinrich today,. She states she will ask Dr Darnelle Catalan for refills.

## 2012-03-29 NOTE — Progress Notes (Signed)
This RN spoke with pt in lobby per her visit to radiation oncology due to no show for appt with Dr Darnelle Catalan earlier in day. [name] states " I thought appointment was tomorrow. " Pt was in route to radiation dept for daily therapy. Informed patient to stop back by to speak with this RN to review needed appointments.  Situation reviewed with MD.  Pt was to have restaging scans post visit last week with AB/PA and see MD for treatment plan post completing radiation. Pt is also due to have faslodex injection.  Per MD plan is to proceed with faslodex. Reschedule restaging scans to include an MRI of pelvis to evaluate for fracture per concern by Dr Michell Heinrich. Schedule follow up with MD in approximately 2 weeks.  This RN spoke with pt per her return post radiation treatment. Discussed above plan with [name] able to reverbalize plan. Per faslodex injection she would prefer to obtain tomorrow post radiation.  [name] did state she needed refills on medications due to " I dropped them in water ".  Pt does not have a history of medication abuse and this will be monitored for surveillance and compliance. Refills given per this episode.  Appointment for injection made for tomorrow post radiation. This RN also informed scheduling to reschedule missed restaging scans.

## 2012-03-30 ENCOUNTER — Other Ambulatory Visit: Payer: Medicaid Other | Admitting: Lab

## 2012-03-30 ENCOUNTER — Ambulatory Visit: Payer: Medicaid Other

## 2012-03-30 ENCOUNTER — Ambulatory Visit: Payer: Medicaid Other | Admitting: Oncology

## 2012-03-30 ENCOUNTER — Telehealth: Payer: Self-pay | Admitting: Radiation Oncology

## 2012-03-30 NOTE — Telephone Encounter (Signed)
Patient did not show for treatment appointment today. Called number listed in demographics. No answer. No option to leave message. Routed message to Dr. Michell Heinrich.

## 2012-03-31 ENCOUNTER — Ambulatory Visit: Payer: Medicaid Other

## 2012-03-31 ENCOUNTER — Telehealth: Payer: Self-pay | Admitting: Radiation Oncology

## 2012-03-31 NOTE — Telephone Encounter (Signed)
Phoned to check patient status since she missed treatment last night. Patient reports she got "confused about her schedule with all the time changes." Patient reports that she is "ok and will be here for treatment tonight." Reported all findings to Dr. Michell Heinrich.

## 2012-04-01 ENCOUNTER — Ambulatory Visit: Payer: Medicaid Other

## 2012-04-04 ENCOUNTER — Ambulatory Visit: Payer: Medicaid Other

## 2012-04-04 ENCOUNTER — Ambulatory Visit (HOSPITAL_COMMUNITY): Admission: RE | Admit: 2012-04-04 | Payer: Medicaid Other | Source: Ambulatory Visit

## 2012-04-04 ENCOUNTER — Ambulatory Visit (HOSPITAL_COMMUNITY): Payer: Medicaid Other

## 2012-04-05 ENCOUNTER — Ambulatory Visit: Payer: Medicaid Other

## 2012-04-05 ENCOUNTER — Telehealth: Payer: Self-pay | Admitting: Radiation Oncology

## 2012-04-05 NOTE — Telephone Encounter (Signed)
Informed by radiation therapist patient did not show again today for treatment. Patient is not listed as inpatient in the [facility] network. Left message requesting patient phone this writer to inform her of her status.

## 2012-04-06 ENCOUNTER — Ambulatory Visit
Admission: RE | Admit: 2012-04-06 | Discharge: 2012-04-06 | Disposition: A | Discharge status: Home / Self Care | Payer: Medicaid Other | Source: Ambulatory Visit | Attending: Radiation Oncology | Admitting: Radiation Oncology

## 2012-04-06 ENCOUNTER — Ambulatory Visit: Payer: Medicaid Other

## 2012-04-06 ENCOUNTER — Encounter: Payer: Self-pay | Admitting: Radiation Oncology

## 2012-04-06 VITALS — BP 127/79 | HR 96 | Resp 18 | Wt 143.0 lb

## 2012-04-06 DIAGNOSIS — C50919 Malignant neoplasm of unspecified site of unspecified female breast: Secondary | ICD-10-CM

## 2012-04-06 DIAGNOSIS — C7951 Secondary malignant neoplasm of bone: Secondary | ICD-10-CM

## 2012-04-06 MED ORDER — OXYCODONE-ACETAMINOPHEN 10-325 MG PO TABS: 1.0000 | ORAL_TABLET | Freq: Four times a day (QID) | ORAL | Status: DC | PRN

## 2012-04-06 NOTE — Progress Notes (Signed)
[facility]    Radiation Oncology [address] [place]     Maryln Gottron, M.D. [place], [place] [address]               Billie Lade, M.D., Ph.D. Phone: [phone]      Molli Hazard A. Kathrynn Running, M.D. Fax: [phone]      Radene Gunning, M.D., Ph.D.         [name], M.D.         Grayland Jack, M.D Weekly Treatment Management Note  Name: [name]     MRN: [id number]        CSN: [id number] Date: 04/06/2012      DOB: 1966-01-15  CC: No primary provider on file.         [place]    Status: Outpatient  Diagnosis: The primary encounter diagnosis was Malignant neoplasm of breast (female), unspecified site. 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  Current Dose: 2700 cGy  Current Fraction: 9  Planned Dose: 3000 cGy  Narrative: [name] was seen today for weekly treatment management. The chart was checked and were reviewed. She is tolerating her treatments well at this time. she denies any headaches. She denies any more problems with double vision. She continues to have significant pain in the right pelvis region. Radiation has not helped this issue as of yet.  I did refill the patient's Percocet prescription today.  Review of patient's allergies indicates no known allergies. Current Outpatient Prescriptions  Medication Sig Dispense Refill  . amLODipine (NORVASC) 5 MG tablet Take 1 tablet (5 mg total) by mouth daily.  30 tablet  6  . cyclobenzaprine (FLEXERIL) 10 MG tablet Take 1 tablet (10 mg total) by mouth 3 (three) times daily as needed. For muscle spasms  30 tablet  1  . dexamethasone (DECADRON) 4 MG tablet As directed by md  30 tablet  0  . HYDROmorphone (DILAUDID) 2 MG tablet Take 1 tablet (2 mg total) by mouth every 4 (four) hours as needed for pain.  30 tablet  0  . Multiple Vitamin (MULTIVITAMIN WITH MINERALS) TABS Take 1 tablet by mouth daily.      [name] omeprazole (PRILOSEC) 20 MG  capsule Take 1 capsule (20 mg total) by mouth daily.  30 capsule  6  . oxyCODONE-acetaminophen (PERCOCET) 10-325 MG per tablet Take 1-2 tablets by mouth every 6 (six) hours as needed.  120 tablet  0  . promethazine (PHENERGAN) 25 MG tablet Take 0.5-1 tablets (12.5-25 mg total) by mouth every 6 (six) hours as needed. Nausea  30 tablet  1  . [DISCONTINUED] oxyCODONE-acetaminophen (PERCOCET) 10-325 MG per tablet Take 1-2 tablets by mouth every 6 (six) hours as needed.  120 tablet  0  . oxyCODONE-acetaminophen (PERCOCET/ROXICET) 5-325 MG per tablet Take 1 tablet by mouth every 4 (four) hours as needed. Pain      . [DISCONTINUED] Alum & Mag Hydroxide-Simeth (MAGIC MOUTHWASH W/LIDOCAINE) SOLN Take 5 mLs by mouth 3 (three) times daily as needed (take 15 minutes prior to meals.).  480 mL  0   Labs:  Lab Results  Component Value Date   WBC 6.9 03/29/2012   HGB 11.2* 03/29/2012   HCT 32.7* 03/29/2012   MCV 89.1 03/29/2012   PLT 225 03/29/2012   Lab Results  Component Value Date   CREATININE 0.7 03/29/2012   BUN 14.0 03/29/2012   NA 137 03/29/2012  K 3.9 03/29/2012   CL 102 03/29/2012   CO2 28 03/29/2012   Lab Results  Component Value Date   ALT 46 03/29/2012   AST 24 03/29/2012   BILITOT 0.34 03/29/2012    Physical Examination:  weight is 143 lb (64.864 kg). Her blood pressure is 127/79 and her pulse is 96. Her respiration is 18.    Wt Readings from Last 3 Encounters:  04/06/12 143 lb (64.864 kg)  03/29/12 143 lb (64.864 kg)  03/24/12 142 lb (64.411 kg)     Lungs - Normal respiratory effort, chest expands symmetrically. Lungs are clear to auscultation, no crackles or wheezes.  Heart has regular rhythm and rate  Abdomen is soft and non tender with normal bowel sounds The extraocular eye movements are intact,  the oral cavity is free of secondary infection. The patient is in a wheelchair today for transportation issues.  Assessment:  Patient tolerating treatments well  Plan:  Continue treatment per original radiation prescription

## 2012-04-06 NOTE — Progress Notes (Signed)
[name] presents to the clinic today for PUT with Dr. Roselind Messier. [name] did not present for treatment on Monday or Tuesday. [name] alert and oriented to person, place, and time. No distress noted. [name] being pushed in wheelchair but, able to walk short distances with assistance of cane. Pleasant affect noted. [name] reports constant right groin pain 8 on a scale of 0-10 for which she take percocet 10/325. [name] requesting refill on percocet. [name] continues to take decadron 2 mg bid. [name] denies numbness or tingling in right but, edema noted. [name] reports a huge appetite. [name] denies nausea, vomiting or dizziness. [name] reports she had a headache this morning that resolved after she ate. Reported all findings to Dr. Roselind Messier.

## 2012-04-07 ENCOUNTER — Encounter: Payer: Self-pay | Admitting: Radiation Oncology

## 2012-04-07 ENCOUNTER — Ambulatory Visit
Admission: RE | Admit: 2012-04-07 | Discharge: 2012-04-07 | Disposition: A | Discharge status: Home / Self Care | Payer: Medicaid Other | Source: Ambulatory Visit | Attending: Radiation Oncology | Admitting: Radiation Oncology

## 2012-04-07 NOTE — Progress Notes (Signed)
  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   She had improvement in her visual symptoms and was able to wean down on her steroids.  She unfortunately continued to have hip pain.  She struggled with compliance throughout her treatment.   Plan: The patient has completed radiation treatment. The patient will return to radiation oncology clinic for routine followup in one month. I advised them to call or return sooner if they have any questions or concerns related to their recovery or treatment.  ------------------------------------------------  [name], MD

## 2012-04-08 ENCOUNTER — Other Ambulatory Visit: Payer: Self-pay | Admitting: *Deleted

## 2012-04-08 ENCOUNTER — Telehealth: Payer: Self-pay | Admitting: Oncology

## 2012-04-08 NOTE — Telephone Encounter (Signed)
S/w the pt and she is aware of her appts on 04/15/2012@11 :30am

## 2012-04-10 ENCOUNTER — Encounter (HOSPITAL_COMMUNITY): Payer: Self-pay | Admitting: Emergency Medicine

## 2012-04-10 ENCOUNTER — Emergency Department (HOSPITAL_COMMUNITY)
Admission: EM | Admit: 2012-04-10 | Discharge: 2012-04-10 | Disposition: A | Discharge status: Home / Self Care | Payer: Medicaid Other | Attending: Emergency Medicine | Admitting: Emergency Medicine

## 2012-04-10 DIAGNOSIS — Z87448 Personal history of other diseases of urinary system: Secondary | ICD-10-CM | POA: Insufficient documentation

## 2012-04-10 DIAGNOSIS — C7951 Secondary malignant neoplasm of bone: Secondary | ICD-10-CM

## 2012-04-10 DIAGNOSIS — Z79899 Other long term (current) drug therapy: Secondary | ICD-10-CM | POA: Insufficient documentation

## 2012-04-10 DIAGNOSIS — Z87440 Personal history of urinary (tract) infections: Secondary | ICD-10-CM | POA: Insufficient documentation

## 2012-04-10 DIAGNOSIS — D492 Neoplasm of unspecified behavior of bone, soft tissue, and skin: Secondary | ICD-10-CM | POA: Insufficient documentation

## 2012-04-10 DIAGNOSIS — I10 Essential (primary) hypertension: Secondary | ICD-10-CM | POA: Insufficient documentation

## 2012-04-10 DIAGNOSIS — F17200 Nicotine dependence, unspecified, uncomplicated: Secondary | ICD-10-CM | POA: Insufficient documentation

## 2012-04-10 DIAGNOSIS — K219 Gastro-esophageal reflux disease without esophagitis: Secondary | ICD-10-CM | POA: Insufficient documentation

## 2012-04-10 DIAGNOSIS — C787 Secondary malignant neoplasm of liver and intrahepatic bile duct: Secondary | ICD-10-CM | POA: Insufficient documentation

## 2012-04-10 DIAGNOSIS — C50919 Malignant neoplasm of unspecified site of unspecified female breast: Secondary | ICD-10-CM

## 2012-04-10 MED ORDER — OXYCODONE-ACETAMINOPHEN 10-325 MG PO TABS: 1.0000 | ORAL_TABLET | Freq: Four times a day (QID) | ORAL | Status: DC | PRN

## 2012-04-10 MED ORDER — MORPHINE SULFATE 2 MG/ML IJ SOLN
2.0000 mg | Freq: Once | INTRAMUSCULAR | Status: DC
  Filled 2012-04-10: qty 1

## 2012-04-10 MED ORDER — MORPHINE SULFATE 2 MG/ML IJ SOLN
2.0000 mg | Freq: Once | INTRAMUSCULAR | Status: AC
  Administered 2012-04-10: 2 mg via INTRAMUSCULAR

## 2012-04-10 NOTE — ED Provider Notes (Signed)
History     CSN: [id number]  Arrival date & time 04/10/12  1416   First MD Initiated Contact with Patient 04/10/12 1531      Chief Complaint  Patient presents with  . Leg Pain    (Consider location/radiation/quality/duration/timing/severity/associated sxs/prior treatment) HPI Pt with metastatic breast cancer to brain and bone complains of R hip pain that has been progressively worsening for approximately 2 weeks. She describes the pain as dull, constant, and limiting ambulation. She recently finished radiation therapy for a bony metastasis to R femoral head. She states she has been taking percocet for pain but ran out of her prescription two days prior to presentation. She denies any new or asymmetric leg swelling. Denies chest pain or SOB. States she had identical complaints approximately 2 weeks ago, for which she was evaluated for DVT and workup was negative. Pt also complains of swelling underneath her jaw which has been present for 2 days. She states swelling is actually improving since onset. Denies any fevers chills. Denies nausea, vomiting, diarrhea. Denies difficulty swallowing or breathing. Denies any other complaints.   Past Medical History  Diagnosis Date  . Hypertension   . GERD (gastroesophageal reflux disease)   . 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 Pyelonephritis 10/01/11  . Recurrent UTI (urinary tract infection) 10/01/11  . S/P chemotherapy, time since 4-12 weeks   . S/P radiation > 12 weeks   . 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 Brain cancer     spread from breast ca    Past Surgical History  Procedure Date  . Cesarean section 1989; 2010  . Mastectomy 2011    right  . Back surgery 2012    "removed tumor,cancer, from my spine  . Port-a-cath removal 2012    right chest  . Portacath placement 2011    right  . Tubal ligation 2010  . Breast biopsy 2011    right    Family History  Problem Relation Age of  Onset  . Cancer Brother 21    died of colon cancer at age of 20    History  Substance Use Topics  . Smoking status: Current Every Day Smoker -- 0.2 packs/day for 31 years    Types: Cigarettes  . Smokeless tobacco: Never Used  . Alcohol Use: No    OB History    Grav Para Term Preterm Abortions TAB SAB Ect Mult Living                  Review of Systems  All other systems reviewed and are negative.    Allergies  Review of patient's allergies indicates no known allergies.  Home Medications   Current Outpatient Rx  Name  Route  Sig  Dispense  Refill  . AMLODIPINE BESYLATE 5 MG PO TABS   Oral   Take 1 tablet (5 mg total) by mouth daily.   30 tablet   6   . CYCLOBENZAPRINE HCL 10 MG PO TABS   Oral   Take 1 tablet (10 mg total) by mouth 3 (three) times daily as needed. For muscle spasms   30 tablet   1   . ADULT MULTIVITAMIN W/MINERALS CH   Oral   Take 1 tablet by mouth daily.         [name] OMEPRAZOLE 20 MG PO CPDR   Oral   Take 1 capsule (20 mg total) by mouth daily.  30 capsule   6   . PROMETHAZINE HCL 25 MG PO TABS   Oral   Take 0.5-1 tablets (12.5-25 mg total) by mouth every 6 (six) hours as needed. Nausea   30 tablet   1   . OXYCODONE-ACETAMINOPHEN 10-325 MG PO TABS   Oral   Take 1-2 tablets by mouth every 6 (six) hours as needed.   60 tablet   0     BP 144/67  Pulse 103  Temp 98.6 F (37 C) (Oral)  Resp 22  SpO2 100%  LMP 03/02/2011  Physical Exam  Constitutional: She appears well-developed and well-nourished. No distress.  HENT:  Head: Normocephalic and atraumatic.       Caries present in R mandibular molars. No oropharyngeal edema. No other abnormalities.   Eyes: Pupils are equal, round, and reactive to light.  Neck: Normal range of motion.       Mild R submandibular edema. No fluctuance,  erythema, or increased warmth. Minimal TTP of this area.   Cardiovascular: Normal rate and regular rhythm.   No murmur heard. Pulmonary/Chest:  Effort normal. She has no wheezes. She has no rales.  Abdominal: Soft. She exhibits no distension. There is no tenderness.  Musculoskeletal:       Mild erythema in R inguinal area consistent with area of radiation exposure. TTP in this region. 5/5 strength and full ROM of bilateral lower extremities. 1+ pitting edema in BLE.      ED Course  Procedures (including critical care time)  Labs Reviewed - No data to display No results found.   1.  Bone metastases            MDM  Patient in worsening pain after running out of percocet prescription. Given morphine IM in ED. Patient d/c'd with 2wk supply of percocet. Instructed to fu with oncologist for her complaints.         Elfredia Nevins, MD 04/10/12 [phone]

## 2012-04-10 NOTE — ED Provider Notes (Signed)
I saw and evaluated the patient, reviewed the resident's note and I agree with the findings and plan.   .Face to face Exam:  General:  Awake HEENT:  Atraumatic Resp:  Normal effort Abd:  Nondistended Neuro:No focal weakness Lymph: No adenopathy   Nelia Shi, MD 04/10/12 1755

## 2012-04-10 NOTE — ED Notes (Signed)
Pt c/o right leg pain.  Pt reports h/o bone cancer and states she just finished radiation to her right leg several days ago.  Pt also reports swelling to right side of neck just below jaw.

## 2012-04-15 ENCOUNTER — Ambulatory Visit (HOSPITAL_BASED_OUTPATIENT_CLINIC_OR_DEPARTMENT_OTHER): Payer: Medicaid Other | Admitting: Oncology

## 2012-04-15 ENCOUNTER — Telehealth: Payer: Self-pay | Admitting: Oncology

## 2012-04-15 ENCOUNTER — Other Ambulatory Visit (HOSPITAL_BASED_OUTPATIENT_CLINIC_OR_DEPARTMENT_OTHER): Payer: Medicaid Other | Admitting: Lab

## 2012-04-15 VITALS — BP 118/70 | HR 102 | Temp 98.2°F | Resp 20 | Ht 62.0 in | Wt 138.5 lb

## 2012-04-15 DIAGNOSIS — C50919 Malignant neoplasm of unspecified site of unspecified female breast: Secondary | ICD-10-CM

## 2012-04-15 DIAGNOSIS — C7951 Secondary malignant neoplasm of bone: Secondary | ICD-10-CM

## 2012-04-15 DIAGNOSIS — C801 Malignant (primary) neoplasm, unspecified: Secondary | ICD-10-CM

## 2012-04-15 DIAGNOSIS — C7952 Secondary malignant neoplasm of bone marrow: Secondary | ICD-10-CM

## 2012-04-15 DIAGNOSIS — C7931 Secondary malignant neoplasm of brain: Secondary | ICD-10-CM

## 2012-04-15 DIAGNOSIS — C50519 Malignant neoplasm of lower-outer quadrant of unspecified female breast: Secondary | ICD-10-CM

## 2012-04-15 DIAGNOSIS — C787 Secondary malignant neoplasm of liver and intrahepatic bile duct: Secondary | ICD-10-CM

## 2012-04-15 LAB — BASIC METABOLIC PANEL (CC13)
BUN: 21 mg/dL (ref 7.0–26.0)
CO2: 28 mEq/L (ref 22–29)
Chloride: 105 mEq/L (ref 98–107)
Creatinine: 0.9 mg/dL (ref 0.6–1.1)
Glucose: 134 mg/dl — ABNORMAL HIGH (ref 70–99)

## 2012-04-15 MED ORDER — OXYCODONE-ACETAMINOPHEN 10-325 MG PO TABS: 1.0000 | ORAL_TABLET | Freq: Four times a day (QID) | ORAL | Status: DC | PRN

## 2012-04-15 NOTE — Progress Notes (Signed)
ID: [name]   DOB: Mar 07, 1966  MR#: [id number]  ION#:[id number]  HISTORY OF PRESENT ILLNESS: The patient delivered a child on 05/17/2009, after which she felt fullness in the right breast. She eventually brought this to her doctors attention and was scheduled for mammography and ultrasound on 03/08/2009. There were no prior exams for comparison. Mammography showed the breast to be dense, with a 5 cm obscured mass in the outer midportion of the right breast. Mass was easily palpable, and there was also a mass in the outer right periareolar region. They were suspicious microcalcifications in the outer midportion of the right breast, as well as within the mass itself. Ultrasound showed an extensive heterogeneous area, corresponding to the palpable abnormality with multiple solid and cystic foci. Overall, the mass measured in excess of 5 cm, in addition to cysts in the breast.  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 The tumors were ER +100%, PR +80%.  Bilateral breast MRI was obtained 03/22/2009. There was a large area of abnormal enhancement in the lower outer quadrant of the right breast measuring up to 9.7 cm. No abnormal appearing lymph nodes and no other masses in either breast.  Patient underwent definitive right mastectomy and sentinel lymph node sampling on 04/29/2009 under the care of Dr. Derrell Lolling. 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 Repeat prognostic panel showed tumor to be ER +93% and PR +51%. HER-2/neu was negative with a ratio of 0.71, with proliferation marker of 36%.  Patient was evaluated by Dr. Darnelle Catalan in January 2011 but failed to keep appointments until she was seen here again in July 2011. In July, she began adjuvant chemotherapy. She received 4 cycles of dose dense  doxorubicin and cyclophosphamide followed by 4 weekly doses of paclitaxel which was discontinued in November 2011 secondary to peripheral neuropathy. The patient then received radiation therapy to the right chest wall which was truncated due to problems with missing appointments.  Patient was started on tamoxifen briefly in June of 2012 which was discontinued due to nausea. The nausea continued, and an abdominal CT in October 2012 confirmed a growth in liver lesions. These were biopsied on 03/10/2011 and showed metastatic adenocarcinoma, still ER positive at 100%, PR +5%. Her subsequent treatments are as detailed below  INTERVAL HISTORY: [name] returns today  for followup of her metastatic breast carcinoma. She continues to see Dr. Michell Heinrich and is receiving palliative radiation, scheduled to be completed next week on October 30. She continues on dexamethasone, currently 4 mg by mouth twice a day per her report. Her vision has improved slightly, although she begins to notice some mild diplopia "when it is time to take the dexamethasone". She is using a walker to assist with ambulation, and continues to have significant pain in the right hip and right leg. She continues on oxycodone/APAP with some relief.   REVIEW OF SYSTEMS: [name] has had no fevers or chills. She has occasional nausea but no emesis. She continues to gain weight due to the steroids. She's having regular bowel movements. She does tend alternating somewhat between mild constipation and mild diarrhea which is not new. She tends to have some reflux for which she takes omeprazole. She has had no increased cough or increased shortness of breath. No chest pain.  Detailed review of systems is otherwise stable and noncontributory.   PAST  MEDICAL HISTORY: Past Medical History  Diagnosis Date  . Hypertension   . GERD (gastroesophageal reflux disease)   . 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 Pyelonephritis 10/01/11  .  Recurrent UTI (urinary tract infection) 10/01/11  . S/P chemotherapy, time since 4-12 weeks   . S/P radiation > 12 weeks   . 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 Brain cancer     spread from breast ca    PAST SURGICAL HISTORY: Past Surgical History  Procedure Date  . Cesarean section 1989; 2010  . Mastectomy 2011    right  . Back surgery 2012    "removed tumor,cancer, from my spine  . Port-a-cath removal 2012    right chest  . Portacath placement 2011    right  . Tubal ligation 2010  . Breast biopsy 2011    right    FAMILY HISTORY The patient's father is alive at age 73.  The patient's mother is alive at age 47  She has four sisters.  She had one brother who died from colon cancer at the age of 48.  There is no breast or ovarian cancer history in the family to her knowledge.  GYNECOLOGIC HISTORY: She is GX P2.  First pregnancy to term at age 46    SOCIAL HISTORY: The patient is not employed.  She lives by herself with her small son whose name is Engineer, technical sales.  Her first child, [name], is studying business in this area.  The patient has one grandchild.  She attends the [employer].    ADVANCED DIRECTIVES: not in place  HEALTH MAINTENANCE: History  Substance Use Topics  . Smoking status: Current Every Day Smoker -- 0.2 packs/day for 31 years    Types: Cigarettes  . Smokeless tobacco: Never Used  . Alcohol Use: No     Colonoscopy:  PAP:  Bone density:  Lipid panel:  No Known Allergies  Current Outpatient Prescriptions  Medication Sig Dispense Refill  . amLODipine (NORVASC) 5 MG tablet Take 1 tablet (5 mg total) by mouth daily.  30 tablet  6  . cyclobenzaprine (FLEXERIL) 10 MG tablet Take 1 tablet (10 mg total) by mouth 3 (three) times daily as needed. For muscle spasms  30 tablet  1  . Multiple Vitamin (MULTIVITAMIN WITH MINERALS) TABS Take 1 tablet by mouth daily.      [name] omeprazole (PRILOSEC) 20 MG capsule Take 1 capsule (20  mg total) by mouth daily.  30 capsule  6  . oxyCODONE-acetaminophen (PERCOCET) 10-325 MG per tablet Take 1-2 tablets by mouth every 6 (six) hours as needed.  60 tablet  0  . promethazine (PHENERGAN) 25 MG tablet Take 0.5-1 tablets (12.5-25 mg total) by mouth every 6 (six) hours as needed. Nausea  30 tablet  1  . [DISCONTINUED] Alum & Mag Hydroxide-Simeth (MAGIC MOUTHWASH W/LIDOCAINE) SOLN Take 5 mLs by mouth 3 (three) times daily as needed (take 15 minutes prior to meals.).  480 mL  0    OBJECTIVE: Middle-aged [place] American woman who appears uncomfortable and slightly anxious Filed Vitals:   04/15/12 1227  BP: 118/70  Pulse: 102  Temp: 98.2 F (36.8 C)  Resp: 20     Body mass index is 25.33 kg/(m^2).    ECOG FS:2   Filed Weights   04/15/12 1227  Weight: 138 lb 8 oz (62.823 kg)   Remainder of physical exam was deferred today.  LAB RESULTS: Lab Results  Component Value Date   WBC 6.9 03/29/2012   NEUTROABS 5.8 03/29/2012   HGB 11.2* 03/29/2012   HCT 32.7* 03/29/2012   MCV 89.1 03/29/2012   PLT 225 03/29/2012      Chemistry      Component Value Date/Time   NA 137 03/29/2012 1521   NA 139 03/13/2012 1215   K 3.9 03/29/2012 1521   K 3.9 03/13/2012 1215   CL 102 03/29/2012 1521   CL 101 03/13/2012 1215   CO2 28 03/29/2012 1521   CO2 25 03/13/2012 1215   BUN 14.0 03/29/2012 1521   BUN 16 03/13/2012 1215   CREATININE 0.7 03/29/2012 1521   CREATININE 0.91 03/13/2012 1215      Component Value Date/Time   CALCIUM 10.3 03/29/2012 1521   CALCIUM 9.2 03/13/2012 1215   ALKPHOS 173* 03/29/2012 1521   ALKPHOS 152* 12/30/2011 1304   AST 24 03/29/2012 1521   AST 24 12/30/2011 1304   ALT 46 03/29/2012 1521   ALT 14 12/30/2011 1304   BILITOT 0.34 03/29/2012 1521   BILITOT 0.4 12/30/2011 1304       Lab Results  Component Value Date   LABCA2 21 09/09/2011    STUDIES: Dg Chest 2 View  02/15/2012  *RADIOLOGY REPORT*  Clinical Data: Chest pain headache  CHEST - 2 VIEW   Comparison: 10/01/2011  Findings: Right mastectomy changes.  Pulmonary hyperinflation is present compatible with chronic lung disease.  Negative for infiltrate or effusion.  No mass lesion or heart failure.  Nipple shadow on the left again noted. Sclerotic lesions in the thoracic spine compatible with metastatic disease.  IMPRESSION: No acute abnormality.   Original Report Authenticated By: Camelia Phenes, M.D.    Dg Scapula Left  02/02/2012  *RADIOLOGY REPORT*  Clinical Data: History of breast cancer, now with left posterior shoulder pain, no known injury  LEFT SCAPULA - 2+ VIEWS  Comparison: PET CT - 01/06/2010  Findings: No definite fracture or dislocation.  No definite aggressive osseous lesions.  Limited visualization of the adjacent thorax is normal.  Regional soft tissues are normal.  IMPRESSION: No explanation for patient's right posterior shoulder pain.   Original Report Authenticated By: Waynard Reeds, M.D.    Dg Hip Complete Right  02/02/2012  *RADIOLOGY REPORT*  Clinical Data: Mid thigh pain.  No known injury.  History of breast cancer per EMR.  RIGHT HIP - COMPLETE 2+ VIEW  Comparison: Pelvic CT 10/01/2011.  Findings: There is ill-defined lucency within the intertrochanteric region which is similar to the prior CT examination. There is questionable minimal cortical irregularity medially near the lesser trochanter.  No displaced fracture is identified.  There are no blastic lesions.  There is no evidence of dislocation.  The hip joint spaces are preserved.  IMPRESSION: Ill-defined lucency within the right femoral neck appears similar to prior CT, and no definite pathologic fracture is identified. However, given the patient's history, metastatic disease cannot be completely excluded.  If the patient has persistent hip pain or inability to bear weight, this would be best further assessed with non emergent MRI.   Original Report Authenticated By: Gerrianne Scale, M.D.    Dg Femur Right  02/02/2012   *RADIOLOGY REPORT*  Clinical Data: Mid right thigh pain.  No known injury.  History of breast cancer according to EMR.  RIGHT FEMUR - 2 VIEW  Comparison: Abdominal pelvic CT 10/01/2011.  Findings: There is ill-defined lucency within the intertrochanteric region  which appears similar to the prior CT.  No displaced fracture or blastic lesion is seen within the femur.  The CT did show blastic metastases throughout the spine.  IMPRESSION: No evidence of acute fracture or dislocation.  Ill-defined lucency within the intertrochanteric region could reflect underlying metastasis or incidental benign lesion.   Original Report Authenticated By: Gerrianne Scale, M.D.    Ct Head W Wo Contrast  02/15/2012  *RADIOLOGY REPORT*  Clinical Data: Headaches.  History of breast cancer.  CT HEAD WITHOUT AND WITH CONTRAST  Technique:  Contiguous axial images were obtained from the base of the skull through the vertex without and with intravenous contrast.  Contrast: OMNIPAQUE IOHEXOL 300 MG/ML  SOLN  Comparison: None.  Findings: The ventricles are normal.  No extra-axial fluid collections are seen.  The brainstem and cerebellum are unremarkable.  No acute intracranial findings such as infarction or hemorrhage.  No mass lesions.  The bony calvarium is intact.  The visualized paranasal sinuses and mastoid air cells are clear.  IMPRESSION: No CT findings for acute intracranial abnormality or metastatic disease.   Original Report Authenticated By: P. Loralie Champagne, M.D.    Mr [name] ZO Contrast  02/20/2012  *RADIOLOGY REPORT*  Clinical Data: Breast cancer.  High blood pressure.  Severe headaches increased double vision.  MRI HEAD WITHOUT AND WITH CONTRAST  Technique:  Multiplanar, multiecho pulse sequences of the brain and surrounding structures were obtained according to standard protocol without and with intravenous contrast  Contrast: 10mL MULTIHANCE GADOBENATE DIMEGLUMINE 529 MG/ML IV SOLN  Comparison: None.  Findings:  Destructive lesion of the clivus with extension towards / into the cavernous sinus region with pituitary gland displaced superiorly with buckling of the infundibulum consistent with metastatic disease. Slight impression upon the undersurface of the optic chiasm/proximal optic nerves by the superior displaced pituitary gland.  Metastatic involvement of the cervical spine suspected although incompletely assessed.  No brain parenchymal enhancing lesion.  No acute infarct.  No intracranial hemorrhage.  Major intracranial vascular structures are patent.  Very mild nonspecific white matter type changes.  No hydrocephalus.  Minimal mucosal thickening ethmoid sinus air cells and mastoid air cells.  IMPRESSION:  Destructive lesion of the clivus with extension towards / into the cavernous sinus region with pituitary gland displaced superiorly with buckling of the infundibulum consistent with metastatic disease. Slight impression upon the undersurface of the optic chiasm/proximal optic nerves by the superior displaced pituitary gland.  Metastatic involvement of the cervical spine suspected although incompletely assessed.  No brain parenchymal enhancing lesion.  Critical Value/emergent results were called by telephone at the time of interpretation on 02/20/2012 at 6:45 p.m. to Dr. Myna Hidalgo, who verbally acknowledged these results.   Original Report Authenticated By: Fuller Canada, M.D.     ASSESSMENT:   46 y.o.  [place] woman    1. 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    2.Status post adjuvant chemotherapy July to November 2011, consisting of 4 cycles of dose dense doxorubicin and cyclophosphamide, followed by 4 of 12 planned weekly doses of paclitaxel given adjuvantly, discontinued secondary to peripheral neuropathy.    3.  Status post radiation to the right chest wall, truncated due to problems with the patient missing appointments.   4. Tamoxifen started June of 2012, but discontinued due to nausea.  5. metastatic disease to liver and bone pathologically documented October of 2012, estrogen receptor 100% positive, progesterone receptor 7% positive, with no HER-2 amplification;  treatment in the metastatic setting has consisted of:   6.Faslodex since 03/16/2011, Zoladex every 3 months also started on 03/16/2011, and monthly zoledronic acid started November 2011, recently held due to pending dental procedures and possible extractions.             7. Status post thoracic laminectomy, T9-T11, for impending cord compression on 03/21/2011, followed by irradiation to the spine completed November 2011  8. Brain MRI 02/20/2012 showed a destructive clivus lesion with extension into the car no sinus, compressing the optic chiasm resulting in left diet diplopia  9. currently receiving palliative radiation to the clivus as well as the right hip     PLAN:  Our entire 20 minute appointment today was spent reviewing Yaeko's side effects, reviewing her medications, discussing our plan of care, and coordinating care.    Nolan will continue with radiation therapy to the clivus and the right hip through Dr. Michell Heinrich, and will complete therapy as scheduled next week on October 30. Since she was unable to keep her previous CT appointment, we will reschedule that and hope to obtain CTs of the chest, abdomen, and pelvis later this week.   She'll see [place] again next week to review those results and discuss her treatment plan. Per Dr. Darnelle Catalan plan, if we document progression in the liver we will consider switching to exemestane/ everolimus.  Westlynn voices understanding and agreement with our plan and will call with any changes or problems.   MAGRINAT,GUSTAV C    04/15/2012

## 2012-04-15 NOTE — Telephone Encounter (Signed)
gve the pt her nov,dec 2013 appt calendar along with the instrructions for the ct scan appt.

## 2012-04-15 NOTE — Progress Notes (Signed)
ID: [name]   DOB: May 25, 1966  MR#: [id number]  WUJ#:[id number]  HISTORY OF PRESENT ILLNESS: The patient delivered a child on 05/17/2009, after which she felt fullness in the right breast. She eventually brought this to her doctors attention and was scheduled for mammography and ultrasound on 03/08/2009. There were no prior exams for comparison. Mammography showed the breast to be dense, with a 5 cm obscured mass in the outer midportion of the right breast. Mass was easily palpable, and there was also a mass in the outer right periareolar region. They were suspicious microcalcifications in the outer midportion of the right breast, as well as within the mass itself. Ultrasound showed an extensive heterogeneous area, corresponding to the palpable abnormality with multiple solid and cystic foci. Overall, the mass measured in excess of 5 cm, in addition to cysts in the breast.  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 The tumors were ER +100%, PR +80%.  Bilateral breast MRI was obtained 03/22/2009. There was a large area of abnormal enhancement in the lower outer quadrant of the right breast measuring up to 9.7 cm. No abnormal appearing lymph nodes and no other masses in either breast.  Patient underwent definitive right mastectomy and sentinel lymph node sampling on 04/29/2009 under the care of Dr. Derrell Lolling. 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 Repeat prognostic panel showed tumor to be ER +93% and PR +51%. HER-2/neu was negative with a ratio of 0.71, with proliferation marker of 36%.  Patient was evaluated by Dr. Darnelle Catalan in January 2011 but failed to keep appointments until she was seen here again in July 2011. In July, she began adjuvant chemotherapy. She received 4 cycles of dose dense  doxorubicin and cyclophosphamide followed by 4 weekly doses of paclitaxel which was discontinued in November 2011 secondary to peripheral neuropathy. The patient then received radiation therapy to the right chest wall which was truncated due to problems with missing appointments.  Patient was started on tamoxifen briefly in June of 2012 which was discontinued due to nausea. The nausea continued, and an abdominal CT in October 2012 confirmed a growth in liver lesions. These were biopsied on 03/10/2011 and showed metastatic adenocarcinoma, still ER positive at 100%, PR +5%. Her subsequent treatments are as detailed below  INTERVAL HISTORY: [name] returns today  for followup of her metastatic breast carcinoma. She completed her radiation treatments without significant problem, but has not had much pain relief. She takes Percocet 2 tablets twice daily. She was scheduled for restaging CT scans but "couldn't make it" because of transportation problems.  REVIEW OF SYSTEMS: She has cramps and pain in the right leg particularly, which does not improve with walking. In fact she can only walk short distances. Sometimes her left eye vision is worse sometimes better. She has her right jaw problem but hasn't been able to get to the dentist yet. Her hot flashes are better. She is not constipated from the Percocet. She denies unusual headaches, nausea, vomiting, cough, phlegm production, pleurisy, or problems with bladder habits. A detailed review of systems was otherwise stable.  PAST MEDICAL HISTORY: Past Medical History  Diagnosis Date  . Hypertension   . GERD (gastroesophageal reflux disease)   . 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 Pyelonephritis 10/01/11  . Recurrent UTI (urinary tract infection) 10/01/11  .  S/P chemotherapy, time since 4-12 weeks   . S/P radiation > 12 weeks   . 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 Brain cancer     spread from breast ca     PAST SURGICAL HISTORY: Past Surgical History  Procedure Date  . Cesarean section 1989; 2010  . Mastectomy 2011    right  . Back surgery 2012    "removed tumor,cancer, from my spine  . Port-a-cath removal 2012    right chest  . Portacath placement 2011    right  . Tubal ligation 2010  . Breast biopsy 2011    right    FAMILY HISTORY The patient's father is alive at age 31.  The patient's mother is alive at age 11.  She has four sisters.  She had one brother who died from colon cancer at the age of 45.  There is no breast or ovarian cancer history in the family to her knowledge.  GYNECOLOGIC HISTORY: She is GX P2.  First pregnancy to term at age 24.    SOCIAL HISTORY: The patient is not employed.  She lives by herself with her small son whose name is Engineer, technical sales.  Her first child, [name], is studying business in this area.  The patient has one grandchild.  She attends the [employer].    ADVANCED DIRECTIVES: not in place  HEALTH MAINTENANCE: History  Substance Use Topics  . Smoking status: Current Every Day Smoker -- 0.2 packs/day for 31 years    Types: Cigarettes  . Smokeless tobacco: Never Used  . Alcohol Use: No     Colonoscopy:  PAP:  Bone density:  Lipid panel:  No Known Allergies  Current Outpatient Prescriptions  Medication Sig Dispense Refill  . amLODipine (NORVASC) 5 MG tablet Take 1 tablet (5 mg total) by mouth daily.  30 tablet  6  . cyclobenzaprine (FLEXERIL) 10 MG tablet Take 1 tablet (10 mg total) by mouth 3 (three) times daily as needed. For muscle spasms  30 tablet  1  . Multiple Vitamin (MULTIVITAMIN WITH MINERALS) TABS Take 1 tablet by mouth daily.      [name] omeprazole (PRILOSEC) 20 MG capsule Take 1 capsule (20 mg total) by mouth daily.  30 capsule  6  . oxyCODONE-acetaminophen (PERCOCET) 10-325 MG per tablet Take 1-2 tablets by mouth every 6 (six) hours as needed.  60 tablet  0  . promethazine (PHENERGAN) 25 MG tablet Take 0.5-1 tablets  (12.5-25 mg total) by mouth every 6 (six) hours as needed. Nausea  30 tablet  1  . [DISCONTINUED] Alum & Mag Hydroxide-Simeth (MAGIC MOUTHWASH W/LIDOCAINE) SOLN Take 5 mLs by mouth 3 (three) times daily as needed (take 15 minutes prior to meals.).  480 mL  0    OBJECTIVE: Middle-aged African American woman  Filed Vitals:   04/15/12 1227  BP: 118/70  Pulse: 102  Temp: 98.2 F (36.8 C)  Resp: 20     Body mass index is 25.33 kg/(m^2).    ECOG FS:2   Filed Weights   04/15/12 1227  Weight: 138 lb 8 oz (62.823 kg)   Sclerae unicteric Oropharynx shows resolving thrush No cervical or supraclavicular adenopathy Lungs no rales or rhonchi Heart regular rate and rhythm Abd soft, NT, +BS MSK significant back pain with movement, but difficulty getting on the exam table, leaning back, and sitting up from a recumbent position. No peripheral edema Neuro: nonfocal; dorsiflexion 5 over 5 bilaterally Breasts:  Deferred   LAB RESULTS: Lab Results  Component Value Date   WBC 6.9 03/29/2012   NEUTROABS 5.8 03/29/2012   HGB 11.2* 03/29/2012   HCT 32.7* 03/29/2012   MCV 89.1 03/29/2012   PLT 225 03/29/2012      Chemistry      Component Value Date/Time   NA 137 03/29/2012 1521   NA 139 03/13/2012 1215   K 3.9 03/29/2012 1521   K 3.9 03/13/2012 1215   CL 102 03/29/2012 1521   CL 101 03/13/2012 1215   CO2 28 03/29/2012 1521   CO2 25 03/13/2012 1215   BUN 14.0 03/29/2012 1521   BUN 16 03/13/2012 1215   CREATININE 0.7 03/29/2012 1521   CREATININE 0.91 03/13/2012 1215      Component Value Date/Time   CALCIUM 10.3 03/29/2012 1521   CALCIUM 9.2 03/13/2012 1215   ALKPHOS 173* 03/29/2012 1521   ALKPHOS 152* 12/30/2011 1304   AST 24 03/29/2012 1521   AST 24 12/30/2011 1304   ALT 46 03/29/2012 1521   ALT 14 12/30/2011 1304   BILITOT 0.34 03/29/2012 1521   BILITOT 0.4 12/30/2011 1304       Lab Results  Component Value Date   LABCA2 21 09/09/2011    STUDIES: Dg Chest 2  View  02/15/2012  *RADIOLOGY REPORT*  Clinical Data: Chest pain headache  CHEST - 2 VIEW  Comparison: 10/01/2011  Findings: Right mastectomy changes.  Pulmonary hyperinflation is present compatible with chronic lung disease.  Negative for infiltrate or effusion.  No mass lesion or heart failure.  Nipple shadow on the left again noted. Sclerotic lesions in the thoracic spine compatible with metastatic disease.  IMPRESSION: No acute abnormality.   Original Report Authenticated By: Camelia Phenes, M.D.    Dg Scapula Left  02/02/2012  *RADIOLOGY REPORT*  Clinical Data: History of breast cancer, now with left posterior shoulder pain, no known injury  LEFT SCAPULA - 2+ VIEWS  Comparison: PET CT - 01/06/2010  Findings: No definite fracture or dislocation.  No definite aggressive osseous lesions.  Limited visualization of the adjacent thorax is normal.  Regional soft tissues are normal.  IMPRESSION: No explanation for patient's right posterior shoulder pain.   Original Report Authenticated By: Waynard Reeds, M.D.    Dg Hip Complete Right  02/02/2012  *RADIOLOGY REPORT*  Clinical Data: Mid thigh pain.  No known injury.  History of breast cancer per EMR.  RIGHT HIP - COMPLETE 2+ VIEW  Comparison: Pelvic CT 10/01/2011.  Findings: There is ill-defined lucency within the intertrochanteric region which is similar to the prior CT examination. There is questionable minimal cortical irregularity medially near the lesser trochanter.  No displaced fracture is identified.  There are no blastic lesions.  There is no evidence of dislocation.  The hip joint spaces are preserved.  IMPRESSION: Ill-defined lucency within the right femoral neck appears similar to prior CT, and no definite pathologic fracture is identified. However, given the patient's history, metastatic disease cannot be completely excluded.  If the patient has persistent hip pain or inability to bear weight, this would be best further assessed with non emergent MRI.    Original Report Authenticated By: Gerrianne Scale, M.D.    Dg Femur Right  02/02/2012  *RADIOLOGY REPORT*  Clinical Data: Mid right thigh pain.  No known injury.  History of breast cancer according to EMR.  RIGHT FEMUR - 2 VIEW  Comparison: Abdominal pelvic CT 10/01/2011.  Findings: There is ill-defined lucency within  the intertrochanteric region which appears similar to the prior CT.  No displaced fracture or blastic lesion is seen within the femur.  The CT did show blastic metastases throughout the spine.  IMPRESSION: No evidence of acute fracture or dislocation.  Ill-defined lucency within the intertrochanteric region could reflect underlying metastasis or incidental benign lesion.   Original Report Authenticated By: Gerrianne Scale, M.D.    Ct Head W Wo Contrast  02/15/2012  *RADIOLOGY REPORT*  Clinical Data: Headaches.  History of breast cancer.  CT HEAD WITHOUT AND WITH CONTRAST  Technique:  Contiguous axial images were obtained from the base of the skull through the vertex without and with intravenous contrast.  Contrast: OMNIPAQUE IOHEXOL 300 MG/ML  SOLN  Comparison: None.  Findings: The ventricles are normal.  No extra-axial fluid collections are seen.  The brainstem and cerebellum are unremarkable.  No acute intracranial findings such as infarction or hemorrhage.  No mass lesions.  The bony calvarium is intact.  The visualized paranasal sinuses and mastoid air cells are clear.  IMPRESSION: No CT findings for acute intracranial abnormality or metastatic disease.   Original Report Authenticated By: P. Loralie Champagne, M.D.    Mr [name] RU Contrast  02/20/2012  *RADIOLOGY REPORT*  Clinical Data: Breast cancer.  High blood pressure.  Severe headaches increased double vision.  MRI HEAD WITHOUT AND WITH CONTRAST  Technique:  Multiplanar, multiecho pulse sequences of the brain and surrounding structures were obtained according to standard protocol without and with intravenous contrast   Contrast: 10mL MULTIHANCE GADOBENATE DIMEGLUMINE 529 MG/ML IV SOLN  Comparison: None.  Findings: Destructive lesion of the clivus with extension towards / into the cavernous sinus region with pituitary gland displaced superiorly with buckling of the infundibulum consistent with metastatic disease. Slight impression upon the undersurface of the optic chiasm/proximal optic nerves by the superior displaced pituitary gland.  Metastatic involvement of the cervical spine suspected although incompletely assessed.  No brain parenchymal enhancing lesion.  No acute infarct.  No intracranial hemorrhage.  Major intracranial vascular structures are patent.  Very mild nonspecific white matter type changes.  No hydrocephalus.  Minimal mucosal thickening ethmoid sinus air cells and mastoid air cells.  IMPRESSION:  Destructive lesion of the clivus with extension towards / into the cavernous sinus region with pituitary gland displaced superiorly with buckling of the infundibulum consistent with metastatic disease. Slight impression upon the undersurface of the optic chiasm/proximal optic nerves by the superior displaced pituitary gland.  Metastatic involvement of the cervical spine suspected although incompletely assessed.  No brain parenchymal enhancing lesion.  Critical Value/emergent results were called by telephone at the time of interpretation on 02/20/2012 at 6:45 p.m. to Dr. Myna Hidalgo, who verbally acknowledged these results.   Original Report Authenticated By: Fuller Canada, M.D.     ASSESSMENT:   46 y.o.  [place] woman    1. 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    2.Status post adjuvant chemotherapy July to November 2011, consisting of 4 cycles of dose dense doxorubicin and cyclophosphamide, followed by 4 of 12 planned  weekly doses of paclitaxel given adjuvantly, discontinued secondary to peripheral neuropathy.    3. Status post radiation to the right chest wall, truncated due to problems with the patient missing appointments.   4. Tamoxifen started June of 2012, but discontinued due to  nausea.    5. metastatic disease to liver and bone pathologically documented October of 2012, estrogen receptor 100% positive, progesterone receptor 7% positive, with no HER-2 amplification;  treatment in the metastatic setting has consisted of:   6.Faslodex monthly and Zoladex every 3 months  started on 03/16/2011, most recent dose September 2013   7./ monthly zoledronic acid started November 2011, most recent dose 03/16/2012  8. Status post thoracic laminectomy, T9-T11, for impending cord compression on 03/21/2011, followed by irradiation to the spine completed November 2011  9. Brain MRI 02/20/2012 showed a destructive clivus lesion with extension into the cavernous sinus, compressing the optic chiasm resulting in left diet diplopia  10. s/p palliative radiation to the clivus as well as the right femur completed 04/07/2012     PLAN:  We continue to have problems with compliance, at least partly due to transportation issues. We have made our social workers available to help with this problem, but she prefers to "make my own phone calls". We have rescheduled her CT scan of the chest abdomen and pelvis. We do need to know what is going on in her liver to decide whether we need to switch to everolimus/ exemestane or continue current treatment. I have again urged her to go see her dentist as she is aware of the possible concerns regarding osteonecrosis of the jaw with bisphosphonates.. We called in a prescription for Diflucan for her to take the next 5 days. Finally I refilled her Percocet today, giving her a 2 week supply. As soon as we have her scan results, we can decide on any treatment changes. Otherwise she will see [place]  again early December.   MAGRINAT,GUSTAV C    04/15/2012

## 2012-04-17 MED ORDER — FLUCONAZOLE 100 MG PO TABS: 100.0000 mg | ORAL_TABLET | Freq: Every day | ORAL | Status: DC

## 2012-04-20 ENCOUNTER — Other Ambulatory Visit: Payer: Self-pay | Admitting: Oncology

## 2012-04-20 ENCOUNTER — Ambulatory Visit (HOSPITAL_COMMUNITY)
Admission: RE | Admit: 2012-04-20 | Discharge: 2012-04-20 | Disposition: A | Discharge status: Home / Self Care | Payer: Medicaid Other | Source: Ambulatory Visit | Attending: Oncology | Admitting: Oncology

## 2012-04-20 DIAGNOSIS — C787 Secondary malignant neoplasm of liver and intrahepatic bile duct: Secondary | ICD-10-CM | POA: Insufficient documentation

## 2012-04-20 DIAGNOSIS — C7952 Secondary malignant neoplasm of bone marrow: Secondary | ICD-10-CM | POA: Insufficient documentation

## 2012-04-20 DIAGNOSIS — D259 Leiomyoma of uterus, unspecified: Secondary | ICD-10-CM | POA: Insufficient documentation

## 2012-04-20 DIAGNOSIS — C7951 Secondary malignant neoplasm of bone: Secondary | ICD-10-CM | POA: Insufficient documentation

## 2012-04-20 DIAGNOSIS — C50919 Malignant neoplasm of unspecified site of unspecified female breast: Secondary | ICD-10-CM | POA: Insufficient documentation

## 2012-04-20 MED ORDER — IOHEXOL 300 MG/ML  SOLN
100.0000 mL | Freq: Once | INTRAMUSCULAR | Status: AC | PRN
  Administered 2012-04-20: 100 mL via INTRAVENOUS

## 2012-04-20 NOTE — Progress Notes (Signed)
Radiology called me the results of [name]'s scan which among other things shows a pathologic nondisplaced fracture of the right femoral neck. I have tried to contact [name] but not succeeded so far. Once we get in touch with her I will let her know the results and we will set her up for orthopedic evaluation. We will also advise her of course to not weight bear on the right.

## 2012-04-21 ENCOUNTER — Telehealth: Payer: Self-pay | Admitting: *Deleted

## 2012-04-21 NOTE — Telephone Encounter (Signed)
This RN called to speak with pt per noted area on CT showing pending fracture in right hip- need to refer to orthopedist. Left message on home phone as well as called her emergency contact ( dtr- [name] ) and message left for a return call.  Note MD has not made a referral as of yet due to unable to contact pt -   Presently until she see ortho [name] needs to limit weight bearing due to high risk for fracture.  [name] returned call promptly- discussed above with pt stating best number to reach her is [phone] or call her dtr [name].  This RN called to [facility]

## 2012-04-22 ENCOUNTER — Telehealth: Payer: Self-pay | Admitting: *Deleted

## 2012-04-22 NOTE — Telephone Encounter (Signed)
Received call from Dr. Franne Forts.  They can see this patient at 1:15 today or Wed. 11/27 at 8am.   Pt. States she can not come today due to transportation.  She will go on Wednesday and arrange to have transportation to Dr. Charlann Boxer and she also has an appt. To see [place] that day in the afternoon.   Also discussed with our SW/ Lauren and she will check and make sure patient has made transportation arrangements.

## 2012-04-26 ENCOUNTER — Other Ambulatory Visit: Payer: Self-pay | Admitting: *Deleted

## 2012-04-26 DIAGNOSIS — C7951 Secondary malignant neoplasm of bone: Secondary | ICD-10-CM

## 2012-04-26 DIAGNOSIS — C50919 Malignant neoplasm of unspecified site of unspecified female breast: Secondary | ICD-10-CM

## 2012-04-26 MED ORDER — OXYCODONE-ACETAMINOPHEN 10-325 MG PO TABS: 1.0000 | ORAL_TABLET | Freq: Four times a day (QID) | ORAL | Status: DC | PRN

## 2012-04-26 MED ORDER — AMLODIPINE BESYLATE 5 MG PO TABS: 5.0000 mg | ORAL_TABLET | Freq: Every day | ORAL | Status: DC

## 2012-04-26 MED ORDER — OMEPRAZOLE 20 MG PO CPDR: 20.0000 mg | DELAYED_RELEASE_CAPSULE | Freq: Every day | ORAL | Status: DC

## 2012-04-26 MED ORDER — CYCLOBENZAPRINE HCL 10 MG PO TABS: 10.0000 mg | ORAL_TABLET | Freq: Three times a day (TID) | ORAL | Status: DC | PRN

## 2012-04-27 ENCOUNTER — Other Ambulatory Visit: Payer: Medicaid Other | Admitting: Lab

## 2012-04-27 ENCOUNTER — Other Ambulatory Visit: Payer: Self-pay | Admitting: Emergency Medicine

## 2012-04-27 ENCOUNTER — Ambulatory Visit: Payer: Medicaid Other

## 2012-05-02 ENCOUNTER — Encounter (HOSPITAL_COMMUNITY): Payer: Self-pay | Admitting: Pharmacy Technician

## 2012-05-02 NOTE — Progress Notes (Signed)
Need orders placed in EPIC for surgery on 05/04/12.  Thank You.

## 2012-05-03 ENCOUNTER — Encounter (HOSPITAL_COMMUNITY)
Admission: RE | Admit: 2012-05-03 | Discharge: 2012-05-03 | Disposition: A | Discharge status: Home / Self Care | Payer: Medicaid Other | Source: Ambulatory Visit | Attending: Orthopedic Surgery | Admitting: Orthopedic Surgery

## 2012-05-03 ENCOUNTER — Telehealth: Payer: Self-pay | Admitting: *Deleted

## 2012-05-03 ENCOUNTER — Encounter (HOSPITAL_COMMUNITY): Payer: Self-pay

## 2012-05-03 ENCOUNTER — Ambulatory Visit (HOSPITAL_BASED_OUTPATIENT_CLINIC_OR_DEPARTMENT_OTHER): Payer: Medicaid Other | Admitting: Oncology

## 2012-05-03 ENCOUNTER — Ambulatory Visit: Payer: Medicaid Other | Admitting: Physician Assistant

## 2012-05-03 VITALS — BP 145/80 | HR 97 | Temp 98.2°F | Resp 20 | Ht 62.0 in | Wt 146.1 lb

## 2012-05-03 DIAGNOSIS — C7951 Secondary malignant neoplasm of bone: Secondary | ICD-10-CM

## 2012-05-03 DIAGNOSIS — C50519 Malignant neoplasm of lower-outer quadrant of unspecified female breast: Secondary | ICD-10-CM

## 2012-05-03 DIAGNOSIS — C787 Secondary malignant neoplasm of liver and intrahepatic bile duct: Secondary | ICD-10-CM

## 2012-05-03 DIAGNOSIS — C50919 Malignant neoplasm of unspecified site of unspecified female breast: Secondary | ICD-10-CM

## 2012-05-03 DIAGNOSIS — C7931 Secondary malignant neoplasm of brain: Secondary | ICD-10-CM

## 2012-05-03 DIAGNOSIS — C7952 Secondary malignant neoplasm of bone marrow: Secondary | ICD-10-CM

## 2012-05-03 LAB — COMPREHENSIVE METABOLIC PANEL
ALT: 31 U/L (ref 0–35)
AST: 37 U/L (ref 0–37)
Alkaline Phosphatase: 259 U/L — ABNORMAL HIGH (ref 39–117)
CO2: 27 mEq/L (ref 19–32)
Calcium: 9.1 mg/dL (ref 8.4–10.5)
GFR calc Af Amer: 89 mL/min — ABNORMAL LOW (ref >90)
Glucose, Bld: 87 mg/dL (ref 70–99)
Potassium: 3.6 mEq/L (ref 3.5–5.1)
Sodium: 137 mEq/L (ref 135–145)
Total Protein: 7 g/dL (ref 6.0–8.3)

## 2012-05-03 LAB — CBC
HCT: 29.1 % — ABNORMAL LOW (ref 36.0–46.0)
MCH: 29.9 pg (ref 26.0–34.0)
MCV: 87.9 fL (ref 78.0–100.0)
RDW: 17.3 % — ABNORMAL HIGH (ref 11.5–15.5)
WBC: 4 10*3/uL (ref 4.0–10.5)

## 2012-05-03 LAB — PROTIME-INR
INR: 1.06 (ref 0.00–1.49)
Prothrombin Time: 13.7 seconds (ref 11.6–15.2)

## 2012-05-03 LAB — TYPE AND SCREEN: Antibody Screen: NEGATIVE

## 2012-05-03 LAB — ABO/RH: ABO/RH(D): A POS

## 2012-05-03 MED ORDER — OXYCODONE-ACETAMINOPHEN 10-325 MG PO TABS: 1.0000 | ORAL_TABLET | Freq: Four times a day (QID) | ORAL | Status: DC | PRN

## 2012-05-03 MED ORDER — EXEMESTANE 25 MG PO TABS: 25.0000 mg | ORAL_TABLET | Freq: Every day | ORAL | Status: DC

## 2012-05-03 MED ORDER — CEFAZOLIN SODIUM-DEXTROSE 2-3 GM-% IV SOLR
2.0000 g | INTRAVENOUS | Status: AC
  Administered 2012-05-04: 2 g via INTRAVENOUS

## 2012-05-03 MED ORDER — EVEROLIMUS 10 MG PO TABS: 10.0000 mg | ORAL_TABLET | Freq: Every day | ORAL | Status: DC

## 2012-05-03 NOTE — H&P (Signed)
TOTAL HIP ADMISSION H&P  Patient is admitted for right hip hemiarthroplasty.  Subjective:  Chief Complaint: right hip fracture / pain  HPI: [name], 46 y.o. female, has a history of pain and functional disability in the right hip(s) due to metastatic disease and patient has failed non-surgical conservative treatments for greater than 12 weeks to include NSAID's and/or analgesics, use of assistive devices, weight reduction as appropriate and activity modification.  Onset of symptoms was abrupt starting 3 months years ago with rapidlly worsening course since that time.The patient noted no past surgery on the right hip(s).  Patient currently rates pain in the right hip at 10 out of 10 with activity. Patient has worsening of pain with activity and weight bearing, trendelenberg gait, pain that interfers with activities of daily living and pain with passive range of motion. Patient has evidence of femoral neck fracture with possible extension in the intertrochanteric region by imaging studies. This condition presents safety issues increasing the risk of falls. This patient has had proximal femur fracture.  There is no current active infection.  Risks, benefits and expectations were discussed with the patient. Patient understand the risks, benefits and expectations and wishes to proceed with surgery.   D/C Plans:  Home with HHPT  Post-op Meds:  No Rx given   Tranexamic Acid:  Not to be given - CA  Decadron:   Not to be given   Patient Active Problem List   Diagnosis Date Noted  . Pyelonephritis 10/01/2011  . GERD (gastroesophageal reflux disease) 07/15/2011  . Hypertension 07/15/2011  . Malignant neoplasm of breast (female), unspecified site 07/15/2011  . Bone metastases 04/07/2011  . 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 03/21/2011   Past Medical History  Diagnosis Date  . Hypertension   . GERD (gastroesophageal reflux disease)   . 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  Pyelonephritis 10/01/11  . Recurrent UTI (urinary tract infection) 10/01/11  . S/P chemotherapy, time since 4-12 weeks   . S/P radiation > 12 weeks   . 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 Brain cancer     spread from breast ca    Past Surgical History  Procedure Date  . Cesarean section 1989; 2010  . Mastectomy 2011    right  . Back surgery 2012    "removed tumor,cancer, from my spine  . Port-a-cath removal 2012    right chest  . Portacath placement 2011    right  . Tubal ligation 2010  . Breast biopsy 2011    right    No prescriptions prior to admission   No Known Allergies  History  Substance Use Topics  . Smoking status: Former Smoker -- 0.2 packs/day for 31 years    Types: Cigarettes    Quit date: 04/26/2012  . Smokeless tobacco: Never Used  . Alcohol Use: No    Family History  Problem Relation Age of Onset  . Cancer Brother 23    died of colon cancer at age of 21     Review of Systems  Constitutional: Positive for malaise/fatigue.  HENT: Negative.   Eyes: Positive for double vision.  Respiratory: Negative.   Cardiovascular: Negative.   Gastrointestinal: Positive for constipation.  Genitourinary: Negative.   Musculoskeletal: Positive for joint pain.  Skin: Negative.   Neurological: Negative.   Endo/Heme/Allergies: Negative.   Psychiatric/Behavioral: Negative.     Objective:  Physical Exam  Constitutional: She is oriented  to person, place, and time. She appears well-developed and well-nourished.  HENT:  Head: Normocephalic and atraumatic.  Mouth/Throat: Oropharynx is clear and moist.  Eyes: Pupils are equal, round, and reactive to light.  Neck: Neck supple. No JVD present. No tracheal deviation present. No thyromegaly present.  Cardiovascular: Normal rate, regular rhythm, normal heart sounds and intact distal pulses.   Respiratory: Effort normal and breath sounds normal. No stridor. She has no wheezes.  GI: She exhibits  distension. There is no tenderness. There is no guarding.  Musculoskeletal:       Right hip: She exhibits decreased range of motion, decreased strength, tenderness and bony tenderness. She exhibits no laceration.  Lymphadenopathy:    She has no cervical adenopathy.  Neurological: She is alert and oriented to person, place, and time.  Skin: Skin is warm and dry.  Psychiatric: She has a normal mood and affect.    Vital signs in last 24 hours: Temp:  [98.2 F (36.8 C)-98.5 F (36.9 C)] 98.2 F (36.8 C) (12/03 1140) Pulse Rate:  [92-97] 97  (12/03 1140) Resp:  [16-20] 20  (12/03 1140) BP: (133-145)/(80-82) 145/80 mmHg (12/03 1140) SpO2:  [100 %] 100 % (12/03 1100) Weight:  [59.875 kg (132 lb)-66.271 kg (146 lb 1.6 oz)] 66.271 kg (146 lb 1.6 oz) (12/03 1140)  Labs:  Estimated Body mass index is 25.33 kg/(m^2) as calculated from the following:   Height as of 04/15/12: 5\' 2" (1.575 m).   Weight as of 04/15/12: 138 lb 8 oz(62.823 kg).   Imaging Review Plain radiographs demonstrate a fracture of the femoral neck that extends to the intertrochanteric area of the right femur. The bone quality appears to be fair for age and reported activity level.  Assessment/Plan:  End stage arthritis, right hip(s)  The patient history, physical examination, clinical judgement of the provider and imaging studies are consistent with a fracture of the right hip(s) and total hip arthroplasty is deemed medically necessary. The treatment options including medical management, injection therapy, arthroscopy and arthroplasty were discussed at length. The risks and benefits of total hip arthroplasty were presented and reviewed. The risks due to aseptic loosening, infection, stiffness, dislocation/subluxation,  thromboembolic complications and other imponderables were discussed.  The patient acknowledged the explanation, agreed to proceed with the plan and consent was signed. Patient is being admitted for inpatient  treatment for surgery, pain control, PT, OT, prophylactic antibiotics, VTE prophylaxis, progressive ambulation and ADL's and discharge planning.The patient is planning to be discharged home with home health services.    Anastasio Auerbach Mica Ramdass   PAC  05/03/2012, 9:38 PM

## 2012-05-03 NOTE — Telephone Encounter (Signed)
Gave patient appointment for 05-13-2012 starting at 1:45pm

## 2012-05-03 NOTE — Pre-Procedure Instructions (Signed)
CBC, CMET, PT, PTT, T/S AND EKG WERE DONE TODAY - PREOP - AT [facility] - AS PER ANESTHESIOLOGIST GUIDELINES--THERE WERE NO PREOP ORDERS IN EPIC FROM DR. OLIN YET. PT HAS CT CHEST REPORT IN EPIC FROM 04/20/12. PT'S CBC AND CMET REPORTS WERE FAXED TO DR. Nilsa Nutting OFFICE TODAY--PT'S HGB 9.9, AKL PHOS 259.  NOTE WITH THE FAX TO REMIND DR. OLIN THAT THERE ARE NO PREOP ORDERS IN EPIC.

## 2012-05-03 NOTE — Telephone Encounter (Signed)
per orders from 05-03-2012 discontinuing the labs and injection

## 2012-05-03 NOTE — Patient Instructions (Signed)
YOUR SURGERY IS SCHEDULED AT [facility]  ON:   TOMORROW  WED  12/4  AT 3:30 PM  REPORT TO [facility] AT:  1:00 PM      PHONE # FOR SHORT STAY IS [phone]  DO NOT EAT ANYTHING AFTER MIDNIGHT THE NIGHT BEFORE YOUR SURGERY.  NO FOOD, NO CHEWING GUM, NO MINTS, NO CANDIES, NO CHEWING TOBACCO. YOU MAY HAVE CLEAR LIQUIDS TO DRINK FROM MIDNIGHT TONIGHT--UNTIL 9:30 AM DAY OF SURGERY--LIKE WATER, SODA OR APPLE JUICE.  NOTHING TO DRINK AFTER 9:30 AM DAY OF SURGERY.  PLEASE TAKE THE FOLLOWING MEDICATIONS THE AM OF YOUR SURGERY WITH A FEW SIPS OF WATER:   OMEPRAZOLE ( PRILOSEC )    OXYCODONE / ACETAMINOPHEN   AMOLODIPINE   DECADRON   IF YOU USE INHALERS--USE YOUR INHALERS THE AM OF YOUR SURGERY AND BRING INHALERS TO THE HOSPITAL -TAKE TO SURGERY.    IF YOU ARE DIABETIC:  DO NOT TAKE ANY DIABETIC MEDICATIONS THE AM OF YOUR SURGERY.  IF YOU TAKE INSULIN IN THE EVENINGS--PLEASE ONLY TAKE 1/2 NORMAL EVENING DOSE THE NIGHT BEFORE YOUR SURGERY.  NO INSULIN THE AM OF YOUR SURGERY.  IF YOU HAVE SLEEP APNEA AND USE CPAP OR BIPAP--PLEASE BRING THE MASK AND THE TUBING.  DO NOT BRING YOUR MACHINE.  DO NOT BRING VALUABLES, MONEY, CREDIT CARDS.  DO NOT WEAR JEWELRY, MAKE-UP, NAIL POLISH AND NO METAL PINS OR CLIPS IN YOUR HAIR. CONTACT LENS, DENTURES / PARTIALS, GLASSES SHOULD NOT BE WORN TO SURGERY AND IN MOST CASES-HEARING AIDS WILL NEED TO BE REMOVED.  BRING YOUR GLASSES CASE, ANY EQUIPMENT NEEDED FOR YOUR CONTACT LENS. FOR PATIENTS ADMITTED TO THE HOSPITAL--CHECK OUT TIME THE DAY OF DISCHARGE IS 11:00 AM.  ALL INPATIENT ROOMS ARE PRIVATE - WITH BATHROOM, TELEPHONE, TELEVISION AND WIFI INTERNET.  IF YOU ARE BEING DISCHARGED THE SAME DAY OF YOUR SURGERY--YOU CAN NOT DRIVE YOURSELF HOME--AND SHOULD NOT GO HOME ALONE BY TAXI OR BUS.  NO DRIVING OR OPERATING MACHINERY FOR 24 HOURS FOLLOWING ANESTHESIA / PAIN MEDICATIONS.  PLEASE MAKE ARRANGEMENTS FOR SOMEONE TO BE WITH YOU AT HOME THE FIRST  24 HOURS AFTER SURGERY. RESPONSIBLE DRIVER'S NAME___________________________                                               PHONE #   _______________________                                  PLEASE READ OVER ANY  FACT SHEETS THAT YOU WERE GIVEN: MRSA INFORMATION, BLOOD TRANSFUSION INFORMATION, INCENTIVE SPIROMETER INFORMATION.

## 2012-05-03 NOTE — Progress Notes (Signed)
ID: [name]   DOB: 04-02-1966  MR#: [id number]  FAO#:[id number]  HISTORY OF PRESENT ILLNESS: The patient delivered a child on 05/17/2009, after which she felt fullness in the right breast. She eventually brought this to her doctors attention and was scheduled for mammography and ultrasound on 03/08/2009. There were no prior exams for comparison. Mammography showed the breast to be dense, with a 5 cm obscured mass in the outer midportion of the right breast. Mass was easily palpable, and there was also a mass in the outer right periareolar region. They were suspicious microcalcifications in the outer midportion of the right breast, as well as within the mass itself. Ultrasound showed an extensive heterogeneous area, corresponding to the palpable abnormality with multiple solid and cystic foci. Overall, the mass measured in excess of 5 cm, in addition to cysts in the breast.  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 The tumors were ER +100%, PR +80%.  Bilateral breast MRI was obtained 03/22/2009. There was a large area of abnormal enhancement in the lower outer quadrant of the right breast measuring up to 9.7 cm. No abnormal appearing lymph nodes and no other masses in either breast.  Patient underwent definitive right mastectomy and sentinel lymph node sampling on 04/29/2009 under the care of Dr. Derrell Lolling. 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 Repeat prognostic panel showed tumor to be ER +93% and PR +51%. HER-2/neu was negative with a ratio of 0.71, with proliferation marker of 36%.  Patient was evaluated by Dr. Darnelle Catalan in January 2011 but failed to keep appointments until she was seen here again in July 2011. In July, she began adjuvant chemotherapy. She received 4 cycles of dose dense  doxorubicin and cyclophosphamide followed by 4 weekly doses of paclitaxel which was discontinued in November 2011 secondary to peripheral neuropathy. The patient then received radiation therapy to the right chest wall which was truncated due to problems with missing appointments.  Patient was started on tamoxifen briefly in June of 2012 which was discontinued due to nausea. The nausea continued, and an abdominal CT in October 2012 confirmed a growth in liver lesions. These were biopsied on 03/10/2011 and showed metastatic adenocarcinoma, still ER positive at 100%, PR +5%. Her subsequent treatments are as detailed below  INTERVAL HISTORY: [name] returns today  for followup of her metastatic breast carcinoma. Since her last visit here she had scans showing among other things a right femoral neck nondisplaced fracture. She has been evaluated by Lajoyce Corners and she is scheduled for surgery tomorrow.  REVIEW OF SYSTEMS: The pain is around is with movements and weightbearing and of she is taking Percocet 2 tablets twice daily and ibuprofen in one or 2 tablets twice daily with moderate control. She's not constipated from the medication. She enjoyed Thanksgiving's, which she described as "beautiful", with her sister from [place] visiting. She has not yet made an appointment to see her dentist, which is keeping her from doing her zoledronic acid. She tolerated the radiation to the brain it well, and currently has no headaches, visual changes, nausea, or vomiting. His been no cough, phlegm production or pleurisy. A detailed review of systems was otherwise negative   PAST MEDICAL HISTORY: Past Medical History  Diagnosis Date  . Hypertension   . GERD (gastroesophageal reflux disease)   . Bone metastases 2012    "  spread from my breast"  . Pyelonephritis 10/01/11  . Recurrent UTI (urinary tract infection) 10/01/11  . S/P chemotherapy, time since 4-12 weeks   . S/P radiation > 12 weeks   . Breast cancer metastasized  to liver 03/21/2011    bone and brain  . Brain cancer     spread from breast ca    PAST SURGICAL HISTORY: Past Surgical History  Procedure Date  . Cesarean section 1989; 2010  . Mastectomy 2011    right  . Back surgery 2012    "removed tumor,cancer, from my spine  . Port-a-cath removal 2012    right chest  . Portacath placement 2011    right  . Tubal ligation 2010  . Breast biopsy 2011    right    FAMILY HISTORY The patient's father is alive at age 47  The patient's mother is alive at age 47  She has four sisters.  She had one brother who died from colon cancer at the age of 92.  There is no breast or ovarian cancer history in the family to her knowledge.  GYNECOLOGIC HISTORY: She is GX P2.  First pregnancy to term at age 46.    SOCIAL HISTORY: The patient is not employed.  She lives by herself with her small son whose name is [name] (he is currently staying with his dad).  Her first child, [name], is studying business in this area.  The patient has one grandchild.  She attends the [employer].    ADVANCED DIRECTIVES: not in place  HEALTH MAINTENANCE: History  Substance Use Topics  . Smoking status: Former Smoker -- 0.2 packs/day for 31 years    Types: Cigarettes    Quit date: 04/26/2012  . Smokeless tobacco: Never Used  . Alcohol Use: No     Colonoscopy:  PAP:  Bone density:  Lipid panel:  No Known Allergies  Current Outpatient Prescriptions  Medication Sig Dispense Refill  . amLODipine (NORVASC) 5 MG tablet Take 5 mg by mouth daily before breakfast.      . cyclobenzaprine (FLEXERIL) 10 MG tablet Take 1 tablet (10 mg total) by mouth 3 (three) times daily as needed. For muscle spasms  30 tablet  1  . dexamethasone (DECADRON) 4 MG tablet Take 2 mg by mouth daily before breakfast.      . ibuprofen (ADVIL,MOTRIN) 600 MG tablet Take 600 mg by mouth every 6 (six) hours as needed. Pain      . Multiple Vitamin (MULTIVITAMIN WITH MINERALS) TABS Take 1  tablet by mouth daily.      [name] omeprazole (PRILOSEC) 20 MG capsule Take 1 capsule (20 mg total) by mouth daily.  30 capsule  6  . oxyCODONE-acetaminophen (PERCOCET) 10-325 MG per tablet Take 1-2 tablets by mouth every 6 (six) hours as needed.  60 tablet  0  . promethazine (PHENERGAN) 25 MG tablet Take 0.5-1 tablets (12.5-25 mg total) by mouth every 6 (six) hours as needed. Nausea  30 tablet  1  . [DISCONTINUED] Alum & Mag Hydroxide-Simeth (MAGIC MOUTHWASH W/LIDOCAINE) SOLN Take 5 mLs by mouth 3 (three) times daily as needed (take 15 minutes prior to meals.).  480 mL  0    OBJECTIVE: Middle-aged [place] American woman with significant pain on weightbearing Filed Vitals:   05/03/12 1140  BP: 145/80  Pulse: 97  Temp: 98.2 F (36.8 C)  Resp: 20     Body mass index is 26.72 kg/(m^2).    ECOG FS:2   Filed  Weights   05/03/12 1140  Weight: 146 lb 1.6 oz (66.271 kg)   Sclerae unicteric Oropharynx clear No cervical or supraclavicular adenopathy Lungs no rales or rhonchi Heart regular rate and rhythm Abd benign MSK no focal spinal tenderness; exam limited by pain Neuro: nonfocal Breasts: Deferred   LAB RESULTS: Lab Results  Component Value Date   WBC 6.9 03/29/2012   NEUTROABS 5.8 03/29/2012   HGB 11.2* 03/29/2012   HCT 32.7* 03/29/2012   MCV 89.1 03/29/2012   PLT 225 03/29/2012      Chemistry      Component Value Date/Time   NA 140 04/15/2012 1150   NA 139 03/13/2012 1215   K 3.8 04/15/2012 1150   K 3.9 03/13/2012 1215   CL 105 04/15/2012 1150   CL 101 03/13/2012 1215   CO2 28 04/15/2012 1150   CO2 25 03/13/2012 1215   BUN 21.0 04/15/2012 1150   BUN 16 03/13/2012 1215   CREATININE 0.9 04/15/2012 1150   CREATININE 0.91 03/13/2012 1215      Component Value Date/Time   CALCIUM 9.4 04/15/2012 1150   CALCIUM 9.2 03/13/2012 1215   ALKPHOS 173* 03/29/2012 1521   ALKPHOS 152* 12/30/2011 1304   AST 24 03/29/2012 1521   AST 24 12/30/2011 1304   ALT 46 03/29/2012 1521   ALT  14 12/30/2011 1304   BILITOT 0.34 03/29/2012 1521   BILITOT 0.4 12/30/2011 1304       Lab Results  Component Value Date   LABCA2 21 09/09/2011    STUDIES: Ct Chest W Contrast  04/20/2012  *RADIOLOGY REPORT*  Clinical Data:  Breast cancer with liver and bone metastasis.  CT CHEST, ABDOMEN AND PELVIS WITH CONTRAST  Technique:  Multidetector CT imaging of the chest, abdomen and pelvis was performed following the standard protocol during bolus administration of intravenous contrast.  Contrast: OMNIPAQUE IOHEXOL 300 MG/ML  SOLN  Comparison:  10/01/2011.  CT CHEST  Findings:  Examination of the chest wall demonstrates a right mastectomy defect.  A right lymph node dissection is noted without findings for chest wall tumor or axillary adenopathy.  No enlarged supraclavicular lymph nodes.  The left breast is unremarkable.  Diffuse osseous metastatic disease is noted.  No pathologic fracture identified. Wide decompressive laminectomy is again noted from T9-T11.  The heart is normal in size.  No pericardial effusion.  No mediastinal or hilar lymphadenopathy.  Small scattered lymph nodes are stable.  The esophagus is grossly normal.  The aorta is normal in caliber.  No dissection.  Examination of the lung parenchyma demonstrates mild interstitial thickening but no findings for pulmonary metastatic disease or acute pulmonary abnormality.  IMPRESSION:  1.  Diffuse osseous metastatic disease without pathologic fracture or canal stenosis. 2.  Wide decompressive laminectomies from T9-T11. 3.  No findings for recurrent chest wall disease, axillary adenopathy or pulmonary metastasis.  CT ABDOMEN AND PELVIS  Findings:  Progressive hepatic metastatic disease is demonstrated with diffuse tumor involvement of both hepatic lobes the spleen is normal.  The pancreas is normal.  There are left renal calculi but no obstructing ureteral calculi.  No renal lesions.  The stomach is not well distended with contrast but no gross  abnormalities are seen.  The small bowel and colon grossly normal.  A fibroid uterus is again demonstrated with a calcified fibroid in the fundus.  The ovaries are normal.  The bladder is normal.  No pelvic mass, adenopathy or free pelvic fluid collections.  No inguinal mass  or hernia.  The aorta is normal in caliber.  The major branch vessels are patent.  No mesenteric or retroperitoneal mass or adenopathy.  Diffuse osseous metastatic disease is again demonstrated. Mild progression overall.  There appears to be an enlarging metastatic lesion in the right hip and a pathologic basicervical neck fracture is suspected.  IMPRESSION:  1.  Marked progression of hepatic metastatic disease. 2.  Progressive metastatic lesion in the right femoral neck and intertrochanteric region with a pathologic nondisplaced fracture through the base of the neck.   Original Report Authenticated By: Rudie Meyer, M.D.    Ct Abdomen Pelvis W Contrast  04/20/2012  *RADIOLOGY REPORT*  Clinical Data:  Breast cancer with liver and bone metastasis.  CT CHEST, ABDOMEN AND PELVIS WITH CONTRAST  Technique:  Multidetector CT imaging of the chest, abdomen and pelvis was performed following the standard protocol during bolus administration of intravenous contrast.  Contrast: OMNIPAQUE IOHEXOL 300 MG/ML  SOLN  Comparison:  10/01/2011.  CT CHEST  Findings:  Examination of the chest wall demonstrates a right mastectomy defect.  A right lymph node dissection is noted without findings for chest wall tumor or axillary adenopathy.  No enlarged supraclavicular lymph nodes.  The left breast is unremarkable.  Diffuse osseous metastatic disease is noted.  No pathologic fracture identified. Wide decompressive laminectomy is again noted from T9-T11.  The heart is normal in size.  No pericardial effusion.  No mediastinal or hilar lymphadenopathy.  Small scattered lymph nodes are stable.  The esophagus is grossly normal.  The aorta is normal in caliber.  No  dissection.  Examination of the lung parenchyma demonstrates mild interstitial thickening but no findings for pulmonary metastatic disease or acute pulmonary abnormality.  IMPRESSION:  1.  Diffuse osseous metastatic disease without pathologic fracture or canal stenosis. 2.  Wide decompressive laminectomies from T9-T11. 3.  No findings for recurrent chest wall disease, axillary adenopathy or pulmonary metastasis.  CT ABDOMEN AND PELVIS  Findings:  Progressive hepatic metastatic disease is demonstrated with diffuse tumor involvement of both hepatic lobes the spleen is normal.  The pancreas is normal.  There are left renal calculi but no obstructing ureteral calculi.  No renal lesions.  The stomach is not well distended with contrast but no gross abnormalities are seen.  The small bowel and colon grossly normal.  A fibroid uterus is again demonstrated with a calcified fibroid in the fundus.  The ovaries are normal.  The bladder is normal.  No pelvic mass, adenopathy or free pelvic fluid collections.  No inguinal mass or hernia.  The aorta is normal in caliber.  The major branch vessels are patent.  No mesenteric or retroperitoneal mass or adenopathy.  Diffuse osseous metastatic disease is again demonstrated. Mild progression overall.  There appears to be an enlarging metastatic lesion in the right hip and a pathologic basicervical neck fracture is suspected.  IMPRESSION:  1.  Marked progression of hepatic metastatic disease. 2.  Progressive metastatic lesion in the right femoral neck and intertrochanteric region with a pathologic nondisplaced fracture through the base of the neck.   Original Report Authenticated By: Rudie Meyer, M.D.     ASSESSMENT:   46 y.o.  [place] woman    1. 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     2.Status post adjuvant chemotherapy July to November  2011, consisting of 4 cycles of dose dense doxorubicin and cyclophosphamide, followed by 4 of 12 planned weekly doses of paclitaxel given adjuvantly, discontinued secondary to peripheral neuropathy.    3. Status post radiation to the right chest wall, truncated due to problems with the patient missing appointments.   4. Tamoxifen started June of 2012, but discontinued due to nausea.    5. metastatic disease to liver and bone pathologically documented October of 2012, estrogen receptor 100% positive, progesterone receptor 7% positive, with no HER-2 amplification;  treatment in the metastatic setting has consisted of:   6.Faslodex started 03/16/2011, discontinued November 2013 with progression. Zoladex every 3 months also started on 03/16/2011, and monthly zoledronic acid started November 2011, the Zometa I currently held due to pending dental procedures and possible extractions.             7. Status post thoracic laminectomy, T9-T11, for impending cord compression on 03/21/2011, followed by irradiation to the spine completed November 2011  8. 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 s/p palliative radiation to the Right hip clivus to 30 Gy completed 04/07/2012  10: Nondisplaced right femoral neck fracture, scheduled for surgery 05/04/2012     PLAN:  I went over her situation with her in detail today. She understands she has significant disease in the liver, and we need to change her medications. The choice lies between further hormonal manipulations or chemotherapy. She would much prefer the former, so I am starting her on exemestane and everolimus. We did discuss the possible toxicities, side effects, and complications of these medications, particularly issues relating to mucositis and pneumonitis from the everolimus.  She is not actually going to start that medication until December 16, but she can start the exemestane now. This should not interfere with her surgery in any way. I am asking her to return to see [place] on December 13, the same day she is ready scheduled to see Dr. Michell Heinrich, and we will review her medications again at that time, as well as the results of her surgery. We should do restaging scans approximately 2 to 2-1/2 months after she starts the everolimus. Lankford Gutzmer C    05/03/2012

## 2012-05-04 ENCOUNTER — Encounter (HOSPITAL_COMMUNITY): Payer: Self-pay | Admitting: *Deleted

## 2012-05-04 ENCOUNTER — Encounter (HOSPITAL_COMMUNITY)
Admission: RE | Disposition: A | Discharge status: Home Health | Payer: Self-pay | Source: Ambulatory Visit | Attending: Orthopedic Surgery

## 2012-05-04 ENCOUNTER — Inpatient Hospital Stay (HOSPITAL_COMMUNITY): Payer: Medicaid Other | Admitting: Anesthesiology

## 2012-05-04 ENCOUNTER — Inpatient Hospital Stay (HOSPITAL_COMMUNITY)
Admission: RE | Admit: 2012-05-04 | Discharge: 2012-05-06 | DRG: 470 | Disposition: A | Discharge status: Home Health | Payer: Medicaid Other | Source: Ambulatory Visit | Attending: Orthopedic Surgery | Admitting: Orthopedic Surgery

## 2012-05-04 ENCOUNTER — Inpatient Hospital Stay (HOSPITAL_COMMUNITY): Payer: Medicaid Other

## 2012-05-04 ENCOUNTER — Encounter (HOSPITAL_COMMUNITY): Payer: Self-pay | Admitting: Anesthesiology

## 2012-05-04 DIAGNOSIS — K219 Gastro-esophageal reflux disease without esophagitis: Secondary | ICD-10-CM | POA: Diagnosis present

## 2012-05-04 DIAGNOSIS — F17200 Nicotine dependence, unspecified, uncomplicated: Secondary | ICD-10-CM | POA: Diagnosis present

## 2012-05-04 DIAGNOSIS — M84453A Pathological fracture, unspecified femur, initial encounter for fracture: Principal | ICD-10-CM | POA: Diagnosis present

## 2012-05-04 DIAGNOSIS — C50919 Malignant neoplasm of unspecified site of unspecified female breast: Secondary | ICD-10-CM

## 2012-05-04 DIAGNOSIS — E663 Overweight: Secondary | ICD-10-CM | POA: Diagnosis present

## 2012-05-04 DIAGNOSIS — C7952 Secondary malignant neoplasm of bone marrow: Secondary | ICD-10-CM | POA: Diagnosis present

## 2012-05-04 DIAGNOSIS — C7951 Secondary malignant neoplasm of bone: Secondary | ICD-10-CM | POA: Diagnosis present

## 2012-05-04 DIAGNOSIS — E876 Hypokalemia: Secondary | ICD-10-CM | POA: Diagnosis not present

## 2012-05-04 DIAGNOSIS — C787 Secondary malignant neoplasm of liver and intrahepatic bile duct: Secondary | ICD-10-CM | POA: Diagnosis present

## 2012-05-04 DIAGNOSIS — D62 Acute posthemorrhagic anemia: Secondary | ICD-10-CM | POA: Diagnosis not present

## 2012-05-04 DIAGNOSIS — Z96649 Presence of unspecified artificial hip joint: Secondary | ICD-10-CM

## 2012-05-04 HISTORY — PX: HIP ARTHROPLASTY: SHX981

## 2012-05-04 LAB — URINALYSIS, ROUTINE W REFLEX MICROSCOPIC
Glucose, UA: NEGATIVE mg/dL
Ketones, ur: NEGATIVE mg/dL
Leukocytes, UA: NEGATIVE
Nitrite: NEGATIVE
Protein, ur: 30 mg/dL — AB
pH: 6.5 (ref 5.0–8.0)

## 2012-05-04 LAB — URINE MICROSCOPIC-ADD ON

## 2012-05-04 SURGERY — HEMIARTHROPLASTY, HIP, DIRECT ANTERIOR APPROACH, FOR FRACTURE
Anesthesia: General | Site: Hip | Laterality: Right | Wound class: Clean

## 2012-05-04 MED ORDER — FLEET ENEMA 7-19 GM/118ML RE ENEM: 1.0000 | ENEMA | Freq: Once | RECTAL | Status: AC | PRN

## 2012-05-04 MED ORDER — ONDANSETRON HCL 4 MG/2ML IJ SOLN
INTRAMUSCULAR | Status: DC | PRN
  Administered 2012-05-04: 4 mg via INTRAVENOUS

## 2012-05-04 MED ORDER — METOCLOPRAMIDE HCL 10 MG PO TABS: 5.0000 mg | ORAL_TABLET | Freq: Three times a day (TID) | ORAL | Status: DC | PRN

## 2012-05-04 MED ORDER — LACTATED RINGERS IV SOLN: INTRAVENOUS | Status: DC

## 2012-05-04 MED ORDER — PANTOPRAZOLE SODIUM 40 MG PO TBEC
40.0000 mg | DELAYED_RELEASE_TABLET | Freq: Every day | ORAL | Status: DC
  Administered 2012-05-04 – 2012-05-06 (×3): 40 mg via ORAL
  Filled 2012-05-04 (×3): qty 1

## 2012-05-04 MED ORDER — METHOCARBAMOL 100 MG/ML IJ SOLN
500.0000 mg | Freq: Four times a day (QID) | INTRAMUSCULAR | Status: DC | PRN
  Administered 2012-05-04: 500 mg via INTRAVENOUS
  Filled 2012-05-04: qty 5

## 2012-05-04 MED ORDER — ROCURONIUM BROMIDE 100 MG/10ML IV SOLN
INTRAVENOUS | Status: DC | PRN
  Administered 2012-05-04: 30 mg via INTRAVENOUS

## 2012-05-04 MED ORDER — HYDROMORPHONE HCL PF 1 MG/ML IJ SOLN: 0.2500 mg | INTRAMUSCULAR | Status: DC | PRN

## 2012-05-04 MED ORDER — DOCUSATE SODIUM 100 MG PO CAPS
100.0000 mg | ORAL_CAPSULE | Freq: Two times a day (BID) | ORAL | Status: DC
  Administered 2012-05-04 – 2012-05-06 (×4): 100 mg via ORAL

## 2012-05-04 MED ORDER — HYDROMORPHONE HCL PF 1 MG/ML IJ SOLN
INTRAMUSCULAR | Status: DC | PRN
  Administered 2012-05-04: 0.5 mg via INTRAVENOUS
  Administered 2012-05-04 (×2): 1 mg via INTRAVENOUS
  Administered 2012-05-04: 0.5 mg via INTRAVENOUS

## 2012-05-04 MED ORDER — GLYCOPYRROLATE 0.2 MG/ML IJ SOLN
INTRAMUSCULAR | Status: DC | PRN
  Administered 2012-05-04: .6 mg via INTRAVENOUS

## 2012-05-04 MED ORDER — HYDROMORPHONE HCL PF 1 MG/ML IJ SOLN
0.5000 mg | INTRAMUSCULAR | Status: DC | PRN
  Administered 2012-05-05 (×2): 1 mg via INTRAVENOUS
  Filled 2012-05-04 (×2): qty 1

## 2012-05-04 MED ORDER — DIPHENHYDRAMINE HCL 25 MG PO CAPS
25.0000 mg | ORAL_CAPSULE | Freq: Four times a day (QID) | ORAL | Status: DC | PRN
  Administered 2012-05-05: 25 mg via ORAL
  Filled 2012-05-04: qty 1

## 2012-05-04 MED ORDER — AMLODIPINE BESYLATE 5 MG PO TABS
5.0000 mg | ORAL_TABLET | Freq: Every day | ORAL | Status: DC
  Administered 2012-05-05 – 2012-05-06 (×2): 5 mg via ORAL
  Filled 2012-05-04 (×2): qty 1

## 2012-05-04 MED ORDER — MUPIROCIN 2 % EX OINT
TOPICAL_OINTMENT | CUTANEOUS | Status: AC
  Filled 2012-05-04: qty 22

## 2012-05-04 MED ORDER — LIDOCAINE HCL (CARDIAC) 20 MG/ML IV SOLN
INTRAVENOUS | Status: DC | PRN
  Administered 2012-05-04: 50 mg via INTRAVENOUS

## 2012-05-04 MED ORDER — LACTATED RINGERS IV SOLN
INTRAVENOUS | Status: DC
  Administered 2012-05-04: 17:00:00 via INTRAVENOUS
  Administered 2012-05-04: 1000 mL via INTRAVENOUS

## 2012-05-04 MED ORDER — OXYCODONE HCL 5 MG PO TABS
5.0000 mg | ORAL_TABLET | ORAL | Status: DC
  Administered 2012-05-04: 10 mg via ORAL
  Administered 2012-05-05 (×2): 15 mg via ORAL
  Administered 2012-05-05: 10 mg via ORAL
  Filled 2012-05-04: qty 3
  Filled 2012-05-04 (×2): qty 2
  Filled 2012-05-04: qty 3

## 2012-05-04 MED ORDER — SUCCINYLCHOLINE CHLORIDE 20 MG/ML IJ SOLN
INTRAMUSCULAR | Status: DC | PRN
  Administered 2012-05-04: 80 mg via INTRAVENOUS

## 2012-05-04 MED ORDER — CEFAZOLIN SODIUM-DEXTROSE 2-3 GM-% IV SOLR
INTRAVENOUS | Status: AC
  Filled 2012-05-04: qty 50

## 2012-05-04 MED ORDER — ONDANSETRON HCL 4 MG/2ML IJ SOLN: 4.0000 mg | Freq: Four times a day (QID) | INTRAMUSCULAR | Status: DC | PRN

## 2012-05-04 MED ORDER — POLYETHYLENE GLYCOL 3350 17 G PO PACK
17.0000 g | PACK | Freq: Two times a day (BID) | ORAL | Status: DC
  Administered 2012-05-05 – 2012-05-06 (×3): 17 g via ORAL

## 2012-05-04 MED ORDER — SODIUM CHLORIDE 0.9 % IV SOLN
100.0000 mL/h | INTRAVENOUS | Status: DC
  Administered 2012-05-04: 100 mL/h via INTRAVENOUS
  Administered 2012-05-05: 20 mL/h via INTRAVENOUS
  Filled 2012-05-04 (×7): qty 1000

## 2012-05-04 MED ORDER — ZOLPIDEM TARTRATE 5 MG PO TABS: 5.0000 mg | ORAL_TABLET | Freq: Every evening | ORAL | Status: DC | PRN

## 2012-05-04 MED ORDER — METHOCARBAMOL 500 MG PO TABS
500.0000 mg | ORAL_TABLET | Freq: Four times a day (QID) | ORAL | Status: DC | PRN
  Administered 2012-05-05 – 2012-05-06 (×2): 500 mg via ORAL
  Filled 2012-05-04 (×2): qty 1

## 2012-05-04 MED ORDER — ONDANSETRON HCL 4 MG PO TABS: 4.0000 mg | ORAL_TABLET | Freq: Four times a day (QID) | ORAL | Status: DC | PRN

## 2012-05-04 MED ORDER — CEFAZOLIN SODIUM-DEXTROSE 2-3 GM-% IV SOLR
2.0000 g | Freq: Four times a day (QID) | INTRAVENOUS | Status: AC
  Administered 2012-05-04 – 2012-05-05 (×2): 2 g via INTRAVENOUS
  Filled 2012-05-04 (×2): qty 50

## 2012-05-04 MED ORDER — BISACODYL 10 MG RE SUPP: 10.0000 mg | Freq: Every day | RECTAL | Status: DC | PRN

## 2012-05-04 MED ORDER — EVEROLIMUS 10 MG PO TABS
10.0000 mg | ORAL_TABLET | Freq: Every day | ORAL | Status: DC
  Filled 2012-05-04: qty 1

## 2012-05-04 MED ORDER — ENOXAPARIN SODIUM 40 MG/0.4ML ~~LOC~~ SOLN
40.0000 mg | SUBCUTANEOUS | Status: DC
  Administered 2012-05-05 – 2012-05-06 (×2): 40 mg via SUBCUTANEOUS
  Filled 2012-05-04 (×4): qty 0.4

## 2012-05-04 MED ORDER — CELECOXIB 200 MG PO CAPS
200.0000 mg | ORAL_CAPSULE | Freq: Two times a day (BID) | ORAL | Status: DC
  Administered 2012-05-04 – 2012-05-06 (×4): 200 mg via ORAL
  Filled 2012-05-04 (×6): qty 1

## 2012-05-04 MED ORDER — MUPIROCIN 2 % EX OINT
TOPICAL_OINTMENT | Freq: Two times a day (BID) | CUTANEOUS | Status: DC
  Administered 2012-05-04: 1 via NASAL

## 2012-05-04 MED ORDER — PROMETHAZINE HCL 25 MG PO TABS: 12.5000 mg | ORAL_TABLET | Freq: Four times a day (QID) | ORAL | Status: DC | PRN

## 2012-05-04 MED ORDER — NEOSTIGMINE METHYLSULFATE 1 MG/ML IJ SOLN
INTRAMUSCULAR | Status: DC | PRN
  Administered 2012-05-04: 3 mg via INTRAVENOUS

## 2012-05-04 MED ORDER — METOCLOPRAMIDE HCL 5 MG/ML IJ SOLN: 5.0000 mg | Freq: Three times a day (TID) | INTRAMUSCULAR | Status: DC | PRN

## 2012-05-04 MED ORDER — MIDAZOLAM HCL 5 MG/5ML IJ SOLN
INTRAMUSCULAR | Status: DC | PRN
  Administered 2012-05-04: 2 mg via INTRAVENOUS

## 2012-05-04 MED ORDER — FERROUS SULFATE 325 (65 FE) MG PO TABS
325.0000 mg | ORAL_TABLET | Freq: Three times a day (TID) | ORAL | Status: DC
  Administered 2012-05-05 – 2012-05-06 (×4): 325 mg via ORAL
  Filled 2012-05-04 (×7): qty 1

## 2012-05-04 MED ORDER — PHENOL 1.4 % MT LIQD
1.0000 | OROMUCOSAL | Status: DC | PRN
  Filled 2012-05-04: qty 177

## 2012-05-04 MED ORDER — PROPOFOL 10 MG/ML IV BOLUS
INTRAVENOUS | Status: DC | PRN
  Administered 2012-05-04: 120 mg via INTRAVENOUS

## 2012-05-04 MED ORDER — DEXAMETHASONE 2 MG PO TABS
2.0000 mg | ORAL_TABLET | Freq: Every day | ORAL | Status: DC
  Administered 2012-05-05 – 2012-05-06 (×2): 2 mg via ORAL
  Filled 2012-05-04 (×4): qty 1

## 2012-05-04 MED ORDER — MENTHOL 3 MG MT LOZG
1.0000 | LOZENGE | OROMUCOSAL | Status: DC | PRN
  Filled 2012-05-04: qty 9

## 2012-05-04 MED ORDER — EXEMESTANE 25 MG PO TABS
25.0000 mg | ORAL_TABLET | Freq: Every day | ORAL | Status: DC
  Administered 2012-05-05 – 2012-05-06 (×2): 25 mg via ORAL
  Filled 2012-05-04 (×3): qty 1

## 2012-05-04 MED ORDER — PROMETHAZINE HCL 25 MG/ML IJ SOLN: 6.2500 mg | INTRAMUSCULAR | Status: DC | PRN

## 2012-05-04 MED ORDER — 0.9 % SODIUM CHLORIDE (POUR BTL) OPTIME
TOPICAL | Status: DC | PRN
  Administered 2012-05-04: 1000 mL

## 2012-05-04 MED ORDER — FENTANYL CITRATE 0.05 MG/ML IJ SOLN
INTRAMUSCULAR | Status: DC | PRN
  Administered 2012-05-04: 50 ug via INTRAVENOUS
  Administered 2012-05-04: 100 ug via INTRAVENOUS
  Administered 2012-05-04 (×2): 50 ug via INTRAVENOUS

## 2012-05-04 MED ORDER — ALUM & MAG HYDROXIDE-SIMETH 200-200-20 MG/5ML PO SUSP
30.0000 mL | ORAL | Status: DC | PRN
  Administered 2012-05-06: 30 mL via ORAL
  Filled 2012-05-04: qty 30

## 2012-05-04 SURGICAL SUPPLY — 51 items
BAG ZIPLOCK 12X15 (MISCELLANEOUS) ×2 IMPLANT
BLADE SAW SGTL 18X1.27X75 (BLADE) ×2 IMPLANT
BNDG COHESIVE 6X5 TAN STRL LF (GAUZE/BANDAGES/DRESSINGS) ×2 IMPLANT
CLOTH BEACON ORANGE TIMEOUT ST (SAFETY) ×2 IMPLANT
CONT SPEC 4OZ CLIKSEAL STRL BL (MISCELLANEOUS) ×2 IMPLANT
DERMABOND ADVANCED (GAUZE/BANDAGES/DRESSINGS) ×1
DERMABOND ADVANCED .7 DNX12 (GAUZE/BANDAGES/DRESSINGS) ×1 IMPLANT
DRAPE INCISE IOBAN 85X60 (DRAPES) ×2 IMPLANT
DRAPE ORTHO SPLIT 77X108 STRL (DRAPES) ×2
DRAPE POUCH INSTRU U-SHP 10X18 (DRAPES) ×2 IMPLANT
DRAPE SURG 17X11 SM STRL (DRAPES) ×2 IMPLANT
DRAPE SURG ORHT 6 SPLT 77X108 (DRAPES) ×2 IMPLANT
DRAPE U-SHAPE 47X51 STRL (DRAPES) ×2 IMPLANT
DRSG AQUACEL AG ADV 3.5X10 (GAUZE/BANDAGES/DRESSINGS) ×2 IMPLANT
DRSG MEPILEX BORDER 4X4 (GAUZE/BANDAGES/DRESSINGS) ×2 IMPLANT
DRSG MEPILEX BORDER 4X8 (GAUZE/BANDAGES/DRESSINGS) ×2 IMPLANT
DURAPREP 26ML APPLICATOR (WOUND CARE) ×2 IMPLANT
ELECT BLADE TIP CTD 4 INCH (ELECTRODE) ×2 IMPLANT
ELECT REM PT RETURN 9FT ADLT (ELECTROSURGICAL) ×2
ELECTRODE REM PT RTRN 9FT ADLT (ELECTROSURGICAL) ×1 IMPLANT
EVACUATOR 1/8 PVC DRAIN (DRAIN) ×2 IMPLANT
FACESHIELD LNG OPTICON STERILE (SAFETY) ×8 IMPLANT
GLOVE BIOGEL PI IND STRL 7.5 (GLOVE) ×1 IMPLANT
GLOVE BIOGEL PI IND STRL 8 (GLOVE) ×1 IMPLANT
GLOVE BIOGEL PI INDICATOR 7.5 (GLOVE) ×1
GLOVE BIOGEL PI INDICATOR 8 (GLOVE) ×1
GLOVE ECLIPSE 8.0 STRL XLNG CF (GLOVE) IMPLANT
GLOVE ORTHO TXT STRL SZ7.5 (GLOVE) ×8 IMPLANT
GLOVE SURG ORTHO 8.0 STRL STRW (GLOVE) ×2 IMPLANT
GLOVE SURG SS PI 7.5 STRL IVOR (GLOVE) ×10 IMPLANT
GOWN BRE IMP PREV XXLGXLNG (GOWN DISPOSABLE) ×4 IMPLANT
GOWN STRL NON-REIN LRG LVL3 (GOWN DISPOSABLE) ×2 IMPLANT
GOWN STRL REIN XL XLG (GOWN DISPOSABLE) ×4 IMPLANT
HANDPIECE INTERPULSE COAX TIP (DISPOSABLE)
IMMOBILIZER KNEE 20 (SOFTGOODS)
IMMOBILIZER KNEE 20 THIGH 36 (SOFTGOODS) IMPLANT
KIT BASIN OR (CUSTOM PROCEDURE TRAY) ×2 IMPLANT
MANIFOLD NEPTUNE II (INSTRUMENTS) ×2 IMPLANT
PACK TOTAL JOINT (CUSTOM PROCEDURE TRAY) ×2 IMPLANT
POSITIONER SURGICAL ARM (MISCELLANEOUS) ×2 IMPLANT
SET HNDPC FAN SPRY TIP SCT (DISPOSABLE) IMPLANT
STRIP CLOSURE SKIN 1/2X4 (GAUZE/BANDAGES/DRESSINGS) ×4 IMPLANT
SUT ETHIBOND NAB CT1 #1 30IN (SUTURE) ×2 IMPLANT
SUT MNCRL AB 4-0 PS2 18 (SUTURE) ×2 IMPLANT
SUT VIC AB 1 CT1 36 (SUTURE) ×4 IMPLANT
SUT VIC AB 2-0 CT1 27 (SUTURE) ×2
SUT VIC AB 2-0 CT1 TAPERPNT 27 (SUTURE) ×2 IMPLANT
SYR 30ML LL (SYRINGE) ×2 IMPLANT
TOWEL OR 17X26 10 PK STRL BLUE (TOWEL DISPOSABLE) ×4 IMPLANT
TRAY FOLEY CATH 14FRSI W/METER (CATHETERS) ×2 IMPLANT
YANKAUER SUCT BULB TIP 10FT TU (MISCELLANEOUS) ×2 IMPLANT

## 2012-05-04 NOTE — Interval H&P Note (Signed)
History and Physical Interval Note:  05/04/2012 3:50 PM  [name]  has presented today for surgery, with the diagnosis of right femoral neck fracture breast cancer  The various methods of treatment have been discussed with the patient and family. After consideration of risks, benefits and other options for treatment, the patient has consented to  Procedure(s) (LRB) with comments: ARTHROPLASTY BIPOLAR HIP (Right) - RIGHT HIP HEMI ARTHROPLASTY  as a surgical intervention .  The patient's history has been reviewed, patient examined, no change in status, stable for surgery.  I have reviewed the patient's chart and labs.  Questions were answered to the patient's satisfaction.     Shelda Pal

## 2012-05-04 NOTE — Anesthesia Preprocedure Evaluation (Signed)
Anesthesia Evaluation  Patient identified by MRN, date of birth, ID band Patient awake    Reviewed: Allergy & Precautions, H&P , NPO status , Patient's Chart, lab work & pertinent test results  Airway Mallampati: II TM Distance: >3 FB Neck ROM: Full    Dental  (+) Poor Dentition and Dental Advisory Given,    Pulmonary Current Smoker,  breath sounds clear to auscultation  Pulmonary exam normal       Cardiovascular hypertension, Pt. on medications and Pt. on home beta blockers Rhythm:Regular Rate:Normal     Neuro/Psych negative neurological ROS  negative psych ROS   GI/Hepatic Neg liver ROS, GERD-  ,Liver mets   Endo/Other  negative endocrine ROS  Renal/GU Renal disease  negative genitourinary   Musculoskeletal   Abdominal   Peds  Hematology negative hematology ROS (+)   Anesthesia Other Findings   Reproductive/Obstetrics negative OB ROS                           Anesthesia Physical Anesthesia Plan  ASA: III  Anesthesia Plan: General   Post-op Pain Management:    Induction: Intravenous  Airway Management Planned: Oral ETT  Additional Equipment:   Intra-op Plan:   Post-operative Plan: Extubation in OR  Informed Consent: I have reviewed the patients History and Physical, chart, labs and discussed the procedure including the risks, benefits and alternatives for the proposed anesthesia with the patient or authorized representative who has indicated his/her understanding and acceptance.   Dental advisory given  Plan Discussed with: CRNA  Anesthesia Plan Comments:         Anesthesia Quick Evaluation

## 2012-05-04 NOTE — Anesthesia Postprocedure Evaluation (Signed)
Anesthesia Post Note  Patient: [name]  Procedure(s) Performed: Procedure(s) (LRB): ARTHROPLASTY BIPOLAR HIP (Right)  Anesthesia type: General  Patient location: PACU  Post pain: Pain level controlled  Post assessment: Post-op Vital signs reviewed  Last Vitals:  Filed Vitals:   05/04/12 1900  BP:   Pulse:   Temp: 36.8 C  Resp:     Post vital signs: Reviewed  Level of consciousness: sedated  Complications: No apparent anesthesia complications

## 2012-05-04 NOTE — Brief Op Note (Signed)
05/04/2012  5:50 PM  PATIENT:  [name]  46 y.o. female  PRE-OPERATIVE DIAGNOSIS:  Right pathologic femoral neck fracture history of breast cancer  POST-OPERATIVE DIAGNOSIS:  Right pathologic femoral neck fracture history of breast cancer  PROCEDURE:  Procedure(s) (LRB) with comments: ARTHROPLASTY BIPOLAR HIP (Right) - RIGHT HIP HEMI ARTHROPLASTY   SURGEON:  Surgeon(s) and Role:    * Shelda Pal, MD - Primary  PHYSICIAN ASSISTANT: Lanney Gins, PA-C  ANESTHESIA:   general  EBL:  Total I/O In: 1000 [I.V.:1000] Out: 400 [Urine:200; Blood:200]  BLOOD ADMINISTERED:none  DRAINS: None  LOCAL MEDICATIONS USED:  NONE  SPECIMEN:  Source of Specimen:  right femoral head  DISPOSITION OF SPECIMEN:  PATHOLOGY  COUNTS:  YES  TOURNIQUET:  * No tourniquets in log *  DICTATION: .Other Dictation: Dictation Number [id number]  PLAN OF CARE: Admit to inpatient   PATIENT DISPOSITION:  PACU - hemodynamically stable.   Delay start of Pharmacological VTE agent (>24hrs) due to surgical blood loss or risk of bleeding: no

## 2012-05-04 NOTE — Transfer of Care (Signed)
Immediate Anesthesia Transfer of Care Note  Patient: [name]  Procedure(s) Performed: Procedure(s) (LRB): ARTHROPLASTY BIPOLAR HIP (Right)  Patient Location: PACU  Anesthesia Type: General  Level of Consciousness: sedated, patient cooperative and responds to stimulaton  Airway & Oxygen Therapy: Patient Spontanous Breathing and Patient connected to face mask oxgen  Post-op Assessment: Report given to PACU RN and Post -op Vital signs reviewed and stable  Post vital signs: Reviewed and stable  Complications: No apparent anesthesia complications

## 2012-05-04 NOTE — Progress Notes (Signed)
Unable to put TED hose on Rt leg due to pain. Sent to OR with pt

## 2012-05-05 LAB — CBC
Hemoglobin: 8 g/dL — ABNORMAL LOW (ref 12.0–15.0)
MCH: 29 pg (ref 26.0–34.0)
MCV: 88 fL (ref 78.0–100.0)
Platelets: 233 10*3/uL (ref 150–400)
RBC: 2.76 MIL/uL — ABNORMAL LOW (ref 3.87–5.11)
WBC: 3.5 10*3/uL — ABNORMAL LOW (ref 4.0–10.5)

## 2012-05-05 LAB — BASIC METABOLIC PANEL
CO2: 25 mEq/L (ref 19–32)
Calcium: 8.6 mg/dL (ref 8.4–10.5)
Chloride: 104 mEq/L (ref 96–112)
Glucose, Bld: 141 mg/dL — ABNORMAL HIGH (ref 70–99)
Sodium: 138 mEq/L (ref 135–145)

## 2012-05-05 MED ORDER — OXYCODONE-ACETAMINOPHEN 5-325 MG PO TABS
1.0000 | ORAL_TABLET | ORAL | Status: DC | PRN
  Administered 2012-05-05 – 2012-05-06 (×6): 2 via ORAL
  Filled 2012-05-05 (×6): qty 2

## 2012-05-05 NOTE — Evaluation (Signed)
Occupational Therapy Evaluation Patient Details Name: [name] MRN: [id number] DOB: [birth year] Today's Date: 05/05/2012 Time: 4098-1191 OT Time Calculation (min): 42 min  OT Assessment / Plan / Recommendation Clinical Impression  Pt is s/p R hemiarthroplasty and displays  increased pain, decreased awareness of hip precautions and decreased independence with ADL. She will benefit from skilled OT to maximize self care independence for d/c home with family.    OT Assessment  Patient needs continued OT Services    Follow Up Recommendations  Home health OT;Supervision/Assistance - 24 hour    Barriers to Discharge      Equipment Recommendations  3 in 1 bedside comode;None recommended by PT    Recommendations for Other Services    Frequency  Min 2X/week    Precautions / Restrictions Precautions Precautions: Posterior Hip Precaution Comments: Educated pt on precautions and handout posted in room Restrictions RLE Weight Bearing: Weight bearing as tolerated        ADL  Eating/Feeding: Simulated;Independent Where Assessed - Eating/Feeding: Chair Grooming: Wash/dry face;Performed;Set up Where Assessed - Grooming: Supported sitting Upper Body Bathing: Performed;Chest;Right arm;Left arm;Abdomen;Minimal assistance;Other (comment) (pt wanting to stand to wash as EOC uncomfortable) Where Assessed - Upper Body Bathing: Unsupported standing Lower Body Bathing: Performed;Moderate assistance;Other (comment) (without AE) Where Assessed - Lower Body Bathing: Supported sit to stand Upper Body Dressing: Simulated;Set up Where Assessed - Upper Body Dressing: Unsupported sitting Lower Body Dressing: Simulated;Moderate assistance Where Assessed - Lower Body Dressing: Supported sit to stand Toilet Transfer: Mining engineer Method: Sit to stand Toileting - Clothing Manipulation and Hygiene: Simulated;Minimal assistance Where Assessed - Medical sales representative and Hygiene: Standing Tub/Shower Transfer Method: Not assessed Equipment Used: Rolling walker ADL Comments: Reviewed hip precautions and started AE education but pt needs to practice with AE. He will be staying with son who he states has a very small bathroom. She thinks she may have to use 3in1 in bedroom. Likely a Mary Sella will not fit in bathroom either. Pt needs reminders about hip precautions with functional mobility especially for not exceeding 90 degrees at hip.     OT Diagnosis: Generalized weakness  OT Problem List: Decreased strength;Decreased knowledge of use of DME or AE;Decreased knowledge of precautions;Pain OT Treatment Interventions: Self-care/ADL training;Therapeutic activities;Patient/family education;DME and/or AE instruction   OT Goals Acute Rehab OT Goals OT Goal Formulation: With patient Time For Goal Achievement: 05/12/12 Potential to Achieve Goals: Good ADL Goals Pt Will Perform Grooming: with supervision;Standing at sink ADL Goal: Grooming - Progress: Goal set today Pt Will Perform Lower Body Bathing: with supervision;Sit to stand from chair;Sit to stand from bed;with adaptive equipment ADL Goal: Lower Body Bathing - Progress: Goal set today Pt Will Perform Lower Body Dressing: with supervision;Sit to stand from chair;Sit to stand from bed;with adaptive equipment ADL Goal: Lower Body Dressing - Progress: Goal set today Pt Will Transfer to Toilet: with supervision;Ambulation;with DME;3-in-1 ADL Goal: Toilet Transfer - Progress: Goal set today Pt Will Perform Toileting - Clothing Manipulation: with supervision;Standing ADL Goal: Toileting - Clothing Manipulation - Progress: Goal set today  Visit Information  Last OT Received On: 05/05/12 Assistance Needed: +1    Subjective Data  Subjective: I need to wash up Patient Stated Goal: wanting to get washed up and apply lotion to legs   Prior Functioning     Home Living Lives With: Son Available  Help at Discharge: Friend(s) Type of Home: House Home Access: Stairs to enter [employer] of Steps: 3 Entrance Stairs-Rails: Right  Home Layout: One level Bathroom Shower/Tub: Engineer, manufacturing systems: Standard Home Adaptive Equipment: Walker - rolling;Straight cane Additional Comments: Pt reports her house has a flight of stairs so she will being going to her son's father's home upon d/c. Prior Function Level of Independence: Independent with assistive device(s) Comments: Pt reports using both cane and RW prior to surgery with only R forefoot WBing due to pain Communication Communication: No difficulties         Vision/Perception     Cognition  Overall Cognitive Status: Appears within functional limits for tasks assessed/performed Arousal/Alertness: Awake/alert Orientation Level: Appears intact for tasks assessed Behavior During Session: [facility] for tasks performed    Extremity/Trunk Assessment Right Upper Extremity Assessment RUE ROM/Strength/Tone: [facility] for tasks assessed Left Upper Extremity Assessment LUE ROM/Strength/Tone: WFL for tasks assessed Right Lower Extremity Assessment RLE ROM/Strength/Tone: Deficits RLE ROM/Strength/Tone Deficits: decreased movement against gravity, maintained precautions Left Lower Extremity Assessment LLE ROM/Strength/Tone: WFL for tasks assessed     Mobility Bed Mobility Bed Mobility: Supine to Sit Supine to Sit: 4: Min assist Details for Bed Mobility Assistance: assist to support R LE under heel per pt request Transfers Transfers: Sit to Stand;Stand to Sit Sit to Stand: 4: Min assist;With upper extremity assist;From chair/3-in-1 Stand to Sit: 4: Min assist;With upper extremity assist;To chair/3-in-1 Details for Transfer Assistance: min verbal cues for THPs and hand placement     Shoulder Instructions     Exercise     Balance Balance Balance Assessed: Yes Dynamic Standing Balance Dynamic Standing - Level of  Assistance: 4: Min assist   End of Session OT - End of Session Activity Tolerance: Patient tolerated treatment well Patient left: in chair;with call bell/phone within reach  GO     Lennox Laity [phone] 05/05/2012, 1:00 PM

## 2012-05-05 NOTE — Op Note (Signed)
NAMELEIA, COLETTI NO.:  [id number]  MEDICAL RECORD NO.:  [id number]  LOCATION:  1605                         FACILITY:  [facility]  PHYSICIAN:  Madlyn Frankel. Charlann Boxer, M.D.  DATE OF BIRTH:  07-Dec-1965  DATE OF PROCEDURE:  05/04/2012 DATE OF DISCHARGE:                              OPERATIVE REPORT   PREOPERATIVE DIAGNOSIS:  Pathologic right femoral neck fracture.  POSTOPERATIVE DIAGNOSIS:  Pathologic right femoral neck fracture associated with a history of breast cancer.  PROCEDURE:  Right hip hemiarthroplasty utilizing a DePuy AML prosthesis, 13.5 small body stature with a 45 unipolar ball, and a -3 adapter.  SURGEON:  Madlyn Frankel. Charlann Boxer, MD  ASSISTANT:  Lanney Gins, PA-C.  Note that Mr. Carmon Sails was present for the case for perioperative management of the operative extremity, assistance with reduction and maintenance of the extremity during the procedure, as well as primary wound closure, and general facilitation of the case.  ANESTHESIA:  General.  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  DRAINS:  None.  COMPLICATIONS:  None apparent.  BLOOD LOSS:  About 1200 mL.  INDICATION FOR PROCEDURE:  Ms. Heckert is a 46 year old female, unfortunately diagnosed with metastatic breast cancer recently with multiple metastatic lesions.  She had presented to the office for evaluation of increasing progressive right hip pain with radiographs that indicated concern for femoral neck fracture extending to the lesser trochanter segment.  It was nondisplaced and she was seen evaluated in office.  The necessity of the procedure was discussed and reviewed for pain control and functional mobility.  At this point, with a pathologic fracture, hemiarthroplasty was the chosen option as opposed to trying to fix this to further debulk this metastatic lesion as well as manage the hip fracture.  Consent  was obtained after reviewing risks of infection, DVT, persistent pain, or the need for future surgery.  PROCEDURE IN DETAIL:  The patient was brought to the operative theater. Once adequate anesthesia, preoperative antibiotics, Ancef administered, she was positioned to the left lateral decubitus position with the right side up.  The right lower extremity was then prepped and draped in sterile fashion.  She was noted to have radiation type changes to her skin with very taut skin on exam.  A time-out was performed identifying the patient, the planned procedure, and extremity.  A lateral based incision was made over the trochanter extending slightly posterior.  Again, the soft tissues were significantly affected by previous treatment in this area.  The iliotibial band and gluteal fascia were then opened up posteriorly.  The short external rotators were taken down separate from the posterior capsule and an L capsulotomy was made.  At this point, I tried to dislocate the hip.  However, the fracture was unstable enough that with internal rotation the fracture was completed.  I thus used a corkscrew- type device and removed the femoral head from the acetabulum.  We measured this on the back table to be 45 mm in diameter.  At this point, retractors were placed and the hip was flexed forward. After further debridement of the proximal hip, I opened up the  proximal femur with a drill, hand reamed once and then irrigated.  Please note that a separate time-out was performed identifying the right hip as pathology to be sent to evaluate for adenocarcinoma to the bone.  I began reaming the femur with straight reamers for the AML prosthesis first with a 10 mm all the way up to a 13-mm reamer where we had good bony purchase distally.  I then broached beginning with a 10 small body broach up to a 13.5 small body broach.  Once I had it to a depth that appeared to mimic the height of the tip of the  trochanter.  I did a trial reduction with a 45 ball and initially a 0 adapter.  The hip was eventually reduced.  She noted a very tight soft tissues, particularly to the lateral side of the hip probably as a result of some of her previous treatment, but I did feel her leg lengths were very close to being equal as compared to the down leg.  Given this, the trial components were removed.  After irrigating the canal and further final debridements as necessary, the final 13.5 small body statured AML prosthesis was opened.  It was then impacted using the drill bit to guide the anteversion of the stem as we impacted into position.  Once it was impacted to the level where it was not going to go any further, I did choose to retrial with a -3 ball as opposed to a 0 ball. This did not really affect the leg lengths on assessment nor did it affect the tension laterally, none the less this was chosen as the final ball.  Once this final adapter was impacted into 45 unipolar ball was then impacted on the clean and dry trunnion and the hip was reduced. The patient's iliotibial band was noted to be very taut.  After reapproximated posterior capsule, no Hemovac drain was placed to prevent further spread of any disease.  I did release the iliotibial band anteriorly to try to take some of the tension off this structure.  I then reapproximated the remaining posterior aspect of the iliotibial band and gluteal fascia.  The remaining of the wound was closed with 2-0 Vicryl and running 4-0 Monocryl.  The hip was cleaned, dried, dressed, sterilely using a Steri- Strips and Aquacel dressing.  The patient was then brought to the recovery room in stable condition tolerating the procedure well.  She will be weightbearing as tolerated.  We will keep her in the hospital for 1-2 nights to check the assessment of our progress and then we will get her home with home physical therapy.  We will see her back in the  office in 2-3 weeks for routine followup. We will then allow her to be treated from a chemotherapy or hormonic treatment as necessary from her oncologist.     Madlyn Frankel. Charlann Boxer, M.D.     MDO/MEDQ  D:  05/04/2012  T:  05/05/2012  Job:  [id number]  cc:   Lowella Dell, M.D. Fax: [phone]

## 2012-05-05 NOTE — Evaluation (Signed)
Physical Therapy Evaluation Patient Details Name: [name] MRN: [id number] DOB: Oct 12, 1965 Today's Date: 05/05/2012 Time: 4098-1191 PT Time Calculation (min): 29 min  PT Assessment / Plan / Recommendation Clinical Impression  Pt s/p R hip hemiarthroplasty due to pathologic femoral neck fx associated with possible metastatic breast cancer.  Pt would benefit from acute PT services in order to improve independence with transfers, ambulation and stairs to prepare for d/c home with son's father.    PT Assessment  Patient needs continued PT services    Follow Up Recommendations  Home health PT    Does the patient have the potential to tolerate intense rehabilitation      Barriers to Discharge        Equipment Recommendations  None recommended by PT    Recommendations for Other Services     Frequency 7X/week    Precautions / Restrictions Precautions Precautions: Posterior Hip Precaution Comments: Educated pt on precautions and handout posted in room Restrictions Weight Bearing Restrictions: No RLE Weight Bearing: Weight bearing as tolerated   Pertinent Vitals/Pain 3/10 R hip pain with ambulation, premedicated, pt c/o itchiness all over (RN aware and gave meds), repositioned in recliner      Mobility  Bed Mobility Bed Mobility: Supine to Sit Supine to Sit: 4: Min assist Details for Bed Mobility Assistance: assist to support R LE under heel per pt request Transfers Transfers: Stand to Sit;Sit to Stand Sit to Stand: 4: Min guard;From bed;With upper extremity assist Stand to Sit: 4: Min guard;To chair/3-in-1;With upper extremity assist Details for Transfer Assistance: verbal cues for safe technique within precautions Ambulation/Gait Ambulation/Gait Assistance: 4: Min guard Ambulation Distance (Feet): 80 Feet Assistive device: Rolling walker Ambulation/Gait Assistance Details: verbal cues for sequence, RW distance, flat R foot, turning toward L side, pt stopped for  short rest break after turning around and had difficulty advancing R LE again, increased time Gait Pattern: Step-to pattern Gait velocity: decreased    Shoulder Instructions     Exercises     PT Diagnosis: Difficulty walking;Acute pain  PT Problem List: Decreased strength;Decreased activity tolerance;Decreased mobility;Decreased knowledge of precautions;Pain;Decreased knowledge of use of DME PT Treatment Interventions: DME instruction;Gait training;Stair training;Functional mobility training;Patient/family education;Therapeutic activities;Therapeutic exercise   PT Goals Acute Rehab PT Goals PT Goal Formulation: With patient Time For Goal Achievement: 05/12/12 Potential to Achieve Goals: Good Pt will go Supine/Side to Sit: with modified independence PT Goal: Supine/Side to Sit - Progress: Goal set today Pt will go Sit to Supine/Side: with modified independence PT Goal: Sit to Supine/Side - Progress: Goal set today Pt will go Sit to Stand: with modified independence PT Goal: Sit to Stand - Progress: Goal set today Pt will go Stand to Sit: with modified independence PT Goal: Stand to Sit - Progress: Goal set today Pt will Ambulate: 51 - 150 feet;with modified independence;with least restrictive assistive device PT Goal: Ambulate - Progress: Goal set today Pt will Go Up / Down Stairs: 3-5 stairs;with supervision;with least restrictive assistive device PT Goal: Up/Down Stairs - Progress: Goal set today Pt will Perform Home Exercise Program: with supervision, verbal cues required/provided PT Goal: Perform Home Exercise Program - Progress: Goal set today  Visit Information  Last PT Received On: 05/05/12 Assistance Needed: +1    Subjective Data  Subjective: I'm not sure what to expect.  It hurt a lot before. (mobility)   Prior Functioning  Home Living Lives With: Son Available Help at Discharge: Friend(s) Type of Home: House Home Access: Stairs  to enter Entrance Stairs-Number of  Steps: 3 Entrance Stairs-Rails: Right Home Layout: One level Home Adaptive Equipment: Walker - rolling;Straight cane Additional Comments: Pt reports her house has a flight of stairs so she will being going to her son's father's home upon d/c. Prior Function Level of Independence: Independent with assistive device(s) Comments: Pt reports using both cane and RW prior to surgery with only R forefoot WBing due to pain Communication Communication: No difficulties    Cognition  Overall Cognitive Status: Appears within functional limits for tasks assessed/performed Arousal/Alertness: Awake/alert Orientation Level: Appears intact for tasks assessed Behavior During Session: [facility] for tasks performed    Extremity/Trunk Assessment Right Upper Extremity Assessment RUE ROM/Strength/Tone: [facility] for tasks assessed Left Upper Extremity Assessment LUE ROM/Strength/Tone: WFL for tasks assessed Right Lower Extremity Assessment RLE ROM/Strength/Tone: Deficits RLE ROM/Strength/Tone Deficits: decreased movement against gravity, maintained precautions Left Lower Extremity Assessment LLE ROM/Strength/Tone: WFL for tasks assessed   Balance    End of Session PT - End of Session Activity Tolerance: Patient tolerated treatment well Patient left: in chair;with call bell/phone within reach  GP     [facility] E 05/05/2012, 11:42 AM Pager: [phone]

## 2012-05-05 NOTE — Progress Notes (Signed)
Utilization review completed.  

## 2012-05-05 NOTE — Progress Notes (Signed)
Physical Therapy Treatment Patient Details Name: SEVILLE BRICK MRN: [id number] DOB: 1966/04/18 Today's Date: 05/05/2012 Time: 4098-1191 PT Time Calculation (min): 26 min  PT Assessment / Plan / Recommendation Comments on Treatment Session  POD # 1 pm session.  Assisted pt OOB to sink as pt wanted to brush teeth.  Static standing x 5 min to perform task.  Amb pt in halwway with increased time.  Positioned in recliner.  Pt instructed on THP and WB status.  Pt plans to return home.    Follow Up Recommendations  Home health PT     Does the patient have the potential to tolerate intense rehabilitation     Barriers to Discharge        Equipment Recommendations  3 in 1 bedside comode;Other (comment) (already has a RW)    Recommendations for Other Services    Frequency 7X/week   Plan Discharge plan remains appropriate    Precautions / Restrictions Precautions Precautions: Posterior Hip Precaution Comments: Pt recalled 2/3 THP so re educated and demonstrated Restrictions Weight Bearing Restrictions: No RLE Weight Bearing: Weight bearing as tolerated   Pertinent Vitals/Pain C/o 4/5 out of 10 during gait    Mobility  Bed Mobility Bed Mobility: Supine to Sit Details for Bed Mobility Assistance: Min Guard for R LE off bed and increased time Transfers Transfers: Sit to Stand;Stand to Sit Sit to Stand: 4: Min guard;4: Min assist;From bed Stand to Sit: To chair/3-in-1;4: Min guard;4: Min assist Details for Transfer Assistance: 25% VC's on proper tech and to avoid hip flex > 90' Ambulation/Gait Ambulation/Gait Assistance: 4: Min guard Ambulation Distance (Feet): 75 Feet Assistive device: Rolling walker Ambulation/Gait Assistance Details: 25% VC's on safety with turns and to avoid hip internal rotation Gait Pattern: Step-to pattern Gait velocity: decreased     PT Goals    Visit Information  Last PT Received On: 05/05/12 Assistance Needed: +1    Subjective Data  Subjective:  I would like to brush my teeth Patient Stated Goal: home   Cognition       Balance     End of Session PT - End of Session Equipment Utilized During Treatment: Gait belt Activity Tolerance: Patient tolerated treatment well;Patient limited by fatigue Patient left: in chair;with call bell/phone within reach   Felecia Shelling  PTA [facility]  Acute  Rehab Pager     [phone]

## 2012-05-05 NOTE — Progress Notes (Signed)
   Subjective: 1 Day Post-Op Procedure(s) (LRB): ARTHROPLASTY BIPOLAR HIP (Right)   Patient reports pain as moderate, she states that the current oxycodone is not controlling her pain. She would like to change to the Percocet which has helped her pain previously. No events throughout the night.   Objective:   VITALS:   Filed Vitals:   05/05/12  BP: 145/77  Pulse: 102  Temp: 99.8 F (37.7 C)   Resp: 17    Neurovascular intact Dorsiflexion/Plantar flexion intact Incision: dressing C/D/I No cellulitis present Compartment soft  LABS  Basename 05/05/12 0442 05/03/12 1100  HGB 8.0* 9.9*  HCT 24.3* 29.1*  WBC 3.5* 4.0  PLT 233 288     Basename 05/05/12 0442 05/03/12 1100  NA 138 137  K 3.4* 3.6  BUN 7 11  CREATININE 0.64 0.88  GLUCOSE 141* 87     Assessment/Plan: 1 Day Post-Op Procedure(s) (LRB): ARTHROPLASTY BIPOLAR HIP (Right) Foley cath d/c'ed Up with therapy Will change to Percocet from the Oxycodone alone Discharge home eventually, when ready  Expected ABLA  Treated with iron and will observe  Overweight (BMI 25-29.9)  Estimated Body mass index is 25.61 kg/(m^2) as calculated from the following:   Height as of this encounter: 5\' 2" (1.575 m).   Weight as of this encounter: 140 lb(63.504 kg). Patient also counseled that weight may inhibit the healing process Patient counseled that losing weight will help with future health issues  Hypokalemia Treated with oral potassium and will observe     Anastasio Auerbach. Humna Moorehouse   PAC  05/05/2012, 10:03 AM

## 2012-05-06 ENCOUNTER — Encounter (HOSPITAL_COMMUNITY): Payer: Self-pay | Admitting: Orthopedic Surgery

## 2012-05-06 LAB — BASIC METABOLIC PANEL
BUN: 10 mg/dL (ref 6–23)
CO2: 27 mEq/L (ref 19–32)
Calcium: 8.9 mg/dL (ref 8.4–10.5)
Chloride: 104 mEq/L (ref 96–112)
Creatinine, Ser: 0.68 mg/dL (ref 0.50–1.10)
Glucose, Bld: 138 mg/dL — ABNORMAL HIGH (ref 70–99)

## 2012-05-06 LAB — CBC
HCT: 24.7 % — ABNORMAL LOW (ref 36.0–46.0)
Hemoglobin: 8.3 g/dL — ABNORMAL LOW (ref 12.0–15.0)
MCH: 29.6 pg (ref 26.0–34.0)
MCV: 88.2 fL (ref 78.0–100.0)
RBC: 2.8 MIL/uL — ABNORMAL LOW (ref 3.87–5.11)
WBC: 5.2 10*3/uL (ref 4.0–10.5)

## 2012-05-06 LAB — URINE CULTURE: Colony Count: NO GROWTH

## 2012-05-06 MED ORDER — FERROUS SULFATE 325 (65 FE) MG PO TABS: 325.0000 mg | ORAL_TABLET | Freq: Three times a day (TID) | ORAL | Status: DC

## 2012-05-06 MED ORDER — ENOXAPARIN SODIUM 40 MG/0.4ML ~~LOC~~ SOLN: 40.0000 mg | SUBCUTANEOUS | Status: DC

## 2012-05-06 MED ORDER — OXYCODONE-ACETAMINOPHEN 5-325 MG PO TABS: 1.0000 | ORAL_TABLET | ORAL | Status: DC | PRN

## 2012-05-06 MED ORDER — POLYETHYLENE GLYCOL 3350 17 G PO PACK: 17.0000 g | PACK | Freq: Two times a day (BID) | ORAL | Status: DC

## 2012-05-06 MED ORDER — DSS 100 MG PO CAPS: 100.0000 mg | ORAL_CAPSULE | Freq: Two times a day (BID) | ORAL | Status: DC

## 2012-05-06 MED ORDER — DIPHENHYDRAMINE HCL 25 MG PO CAPS: 25.0000 mg | ORAL_CAPSULE | Freq: Four times a day (QID) | ORAL | Status: DC | PRN

## 2012-05-06 MED ORDER — METHOCARBAMOL 500 MG PO TABS: 500.0000 mg | ORAL_TABLET | Freq: Four times a day (QID) | ORAL | Status: DC | PRN

## 2012-05-06 NOTE — Care Management Note (Signed)
  Page 2 of 2   05/06/2012     11:41:29 AM   CARE MANAGEMENT NOTE 05/06/2012  Patient:  [name], [name]   Account Number:  [id number]  Date Initiated:  05/06/2012  Documentation initiated by:  Colleen Can  Subjective/Objective Assessment:   dx pathologic femoral neck fracture; rt hip hemiarthroplasty     Action/Plan:   CM spoke with patient. Plans are for her to go to fiance home in [place], [place] where he will be caregiver. She is requesting [facility] who provided her care previously in [facility].   Anticipated DC Date:  05/06/2012   Anticipated DC Plan:  HOME W HOME HEALTH SERVICES  In-house referral  NA      DC Planning Services  CM consult      [facility] Choice  HOME HEALTH  DURABLE MEDICAL EQUIPMENT   Choice offered to / List presented to:  C-1 Patient   DME arranged  3-N-1  Levan Hurst      DME agency  [facility]     [facility] arranged  HH-2 PT      Status of service:  In process, will continue to follow Medicare Important Message given?   (If response is "NO", the following Medicare IM given date fields will be blank) Date Medicare IM given:   Date Additional Medicare IM given:    Discharge Disposition:    Per UR Regulation:    If discussed at Long Length of Stay Meetings, dates discussed:    Comments:  05/06/2012 Colleen Can BSN RN CCM [phone] [facility] rep is currently checking to see if they have availability in [facility]. 11:37 Advised by [facility] that they can provide services with start date of tomorrow 05/07/2012 Contact information for caregiver: [place] [phone] [place] [phone] [facility] notified of adrress.

## 2012-05-06 NOTE — Progress Notes (Signed)
Occupational Therapy Treatment Patient Details Name: [name] MRN: [id number] DOB: May 26, 1966 Today's Date: 05/06/2012 Time: 7829-5621 OT Time Calculation (min): 51 min  OT Assessment / Plan / Recommendation Comments on Treatment Session Pt progressing:  needs supervision and cues for thps during activity. Educated the person she will stay with    Follow Up Recommendations  Home health OT;Supervision/Assistance - 24 hour    Barriers to Discharge       Equipment Recommendations  3 in 1 bedside comode;Other (comment)    Recommendations for Other Services    Frequency Min 2X/week   Plan      Precautions / Restrictions Precautions Precautions: Posterior Hip Precaution Comments: recalled 2/3 thps but needs cues with function Restrictions RLE Weight Bearing: Weight bearing as tolerated   Pertinent Vitals/Pain No c/o pain. Premedicated    ADL  Grooming: Performed;Supervision/safety;Wash/dry hands Where Assessed - Grooming: Supported standing Lower Body Bathing: Simulated;Min guard (with ae) Where Assessed - Lower Body Bathing: Supported sit to stand Lower Body Dressing: Performed;Moderate assistance (with ae:  difficulty with sock aid and tennis shoes) Where Assessed - Lower Body Dressing: Supported sit to stand Toilet Transfer: Research scientist (life sciences) Method: Sit to Barista: Raised toilet seat with arms (or 3-in-1 over toilet) Toileting - Clothing Manipulation and Hygiene: Performed;Supervision/safety Where Assessed - Engineer, mining and Hygiene: Sit to stand from 3-in-1 or toilet Equipment Used: Rolling walker Transfers/Ambulation Related to ADLs: supervision ambulating to bathroom ADL Comments: mod cues for thps (internal rotation and especially 90 degrees during ADL.  Son's father came in and also reviewed them with him    OT Diagnosis:    OT Problem List:   OT Treatment Interventions:     OT  Goals Acute Rehab OT Goals Time For Goal Achievement: 05/12/12 ADL Goals Pt Will Perform Grooming: with supervision;Standing at sink ADL Goal: Grooming - Progress: Met Pt Will Perform Lower Body Bathing: with supervision;Sit to stand from chair;Sit to stand from bed;with adaptive equipment ADL Goal: Lower Body Bathing - Progress: Progressing toward goals Pt Will Perform Lower Body Dressing: with supervision;Sit to stand from chair;Sit to stand from bed;with adaptive equipment ADL Goal: Lower Body Dressing - Progress: Progressing toward goals Pt Will Transfer to Toilet: with supervision;Ambulation;with DME;3-in-1 ADL Goal: Toilet Transfer - Progress: Met Pt Will Perform Toileting - Clothing Manipulation: with supervision;Standing ADL Goal: Toileting - Clothing Manipulation - Progress: Met  Visit Information  Last OT Received On: 05/06/12 Assistance Needed: +1    Subjective Data      Prior Functioning       Cognition  Overall Cognitive Status: Appears within functional limits for tasks assessed/performed Arousal/Alertness: Awake/alert Orientation Level: Appears intact for tasks assessed Behavior During Session: [facility] for tasks performed Cognition - Other Comments: needs reinforcement with thps    Mobility  Shoulder Instructions Transfers Sit to Stand: 5: Supervision Details for Transfer Assistance: min cues to extend leg and arm position       Exercises      Balance     End of Session  Left in chair with call bell; family/visitor in room  GO     [name] 05/06/2012, 11:29 AM Marica Otter, OTR/L [phone] 05/06/2012

## 2012-05-06 NOTE — Progress Notes (Signed)
Physical Therapy Treatment Patient Details Name: [name] MRN: [id number] DOB: [birth year] Today's Date: 05/06/2012 Time: 8295-6213 PT Time Calculation (min): 24 min  PT Assessment / Plan / Recommendation Comments on Treatment Session  Pt ambulated in hallway and practiced step.  Caregiver also present for tx and observed.  Pt performed exercises.  Pt had no questions/concerns about d/c.     Follow Up Recommendations  Home health PT     Does the patient have the potential to tolerate intense rehabilitation     Barriers to Discharge        Equipment Recommendations  3 in 1 bedside comode    Recommendations for Other Services    Frequency     Plan Discharge plan remains appropriate;Frequency remains appropriate    Precautions / Restrictions Precautions Precautions: Posterior Hip Precaution Comments: pt able to verbalize precautions Restrictions RLE Weight Bearing: Weight bearing as tolerated   Pertinent Vitals/Pain 5/10 R hip, RN notified, repositioned    Mobility  Transfers Transfers: Sit to Stand;Stand to Sit Sit to Stand: 5: Supervision;With armrests;From chair/3-in-1 Stand to Sit: 5: Supervision;With armrests;To chair/3-in-1 Details for Transfer Assistance: verbal cues for hand placement Ambulation/Gait Ambulation/Gait Assistance: 5: Supervision Ambulation Distance (Feet): 80 Feet Assistive device: Rolling walker Ambulation/Gait Assistance Details: verbal cue for turning toward L LE Gait Pattern: Step-through pattern;Antalgic Gait velocity: decreased Stairs: Yes Stairs Assistance: 4: Min guard Stairs Assistance Details (indicate cue type and reason): verbal cue for sequence, pt had her own way of performing since she states she has 1 step which was performed once and then pt states she has step up and then porch and demonstrated her own technique, pt was advised to have person hold her RW and have definitely have 2nd person if performing stairs with her  technique.  Stair Management Technique: Step to pattern;Forwards;With walker Number of Stairs: 1     Exercises Total Joint Exercises Ankle Circles/Pumps: AROM;Both;10 reps Quad Sets: AROM;Both;10 reps Gluteal Sets: AROM;Both;10 reps Heel Slides: AAROM;10 reps;Right Hip ABduction/ADduction: AAROM;10 reps;Right Straight Leg Raises: AAROM;10 reps;Right Long Arc Quad: AROM;10 reps;Right   PT Diagnosis:    PT Problem List:   PT Treatment Interventions:     PT Goals Acute Rehab PT Goals PT Goal: Sit to Stand - Progress: Progressing toward goal PT Goal: Stand to Sit - Progress: Progressing toward goal PT Goal: Ambulate - Progress: Progressing toward goal PT Goal: Up/Down Stairs - Progress: Progressing toward goal PT Goal: Perform Home Exercise Program - Progress: Progressing toward goal  Visit Information  Last PT Received On: 05/06/12 Assistance Needed: +1    Subjective Data  Subjective: I'm feeling better today.   Cognition  Overall Cognitive Status: Appears within functional limits for tasks assessed/performed Arousal/Alertness: Awake/alert Orientation Level: Appears intact for tasks assessed Behavior During Session: [facility] for tasks performed Cognition - Other Comments: needs reinforcement with thps    Balance     End of Session PT - End of Session Activity Tolerance: Patient tolerated treatment well Patient left: in chair;with call bell/phone within reach;with family/visitor present   GP     [name],[name] 05/06/2012, 12:41 PM Pager: [phone]

## 2012-05-06 NOTE — Progress Notes (Signed)
   Subjective: 2 Days Post-Op Procedure(s) (LRB): ARTHROPLASTY BIPOLAR HIP (Right)   Patient reports pain as mild, pain is under better control with the percocet. She states that she feels much better than prior to surgery. No events throughout the night. Ready to be discharged home.  Objective:   VITALS:   Filed Vitals:   05/06/12 0434  BP: 105/64  Pulse: 91  Temp: 98 F (36.7 C)  Resp: 16    Neurovascular intact Dorsiflexion/Plantar flexion intact Incision: dressing C/D/I No cellulitis present Compartment soft  LABS  Basename 05/06/12 0415 05/05/12 0442 05/03/12 1100  HGB 8.3* 8.0* 9.9*  HCT 24.7* 24.3* 29.1*  WBC 5.2 3.5* 4.0  PLT 228 233 288     Basename 05/06/12 0415 05/05/12 0442 05/03/12 1100  NA 138 138 137  K 3.4* 3.4* 3.6  BUN 10 7 11   CREATININE 0.68 0.64 0.88  GLUCOSE 138* 141* 87     Assessment/Plan: 2 Days Post-Op Procedure(s) (LRB): ARTHROPLASTY BIPOLAR HIP (Right) Up with therapy Discharge home with home health Follow up in 2 weeks at [facility]. Follow up with OLIN,Enis Riecke D in 2 weeks.  Contact information:  [facility] [address], [address] [place] [address] [phone]     Expected ABLA  Treated with iron and will observe  Overweight (BMI 25-29.9)  Estimated Body mass index is 25.61 kg/(m^2) as calculated from the following:  Height as of this encounter: 5\' 2" (1.575 m).  Weight as of this encounter: 140 lb(63.504 kg).  Patient also counseled that weight may inhibit the healing process  Patient counseled that losing weight will help with future health issues   Hypokalemia  Will observe    Anastasio Auerbach. Manus Weedman   PAC  05/06/2012, 8:22 AM

## 2012-05-11 NOTE — Discharge Summary (Signed)
Physician Discharge Summary  Patient ID: BRALYN ESPINO MRN: [id number] DOB/AGE: [birth year] 47 y.o.  Admit date: 05/04/2012 Discharge date: 05/06/2012   Procedures:  Procedure(s) (LRB): ARTHROPLASTY BIPOLAR HIP (Right)  Attending Physician:  Dr. Durene Romans   Admission Diagnoses:   Right hip pathologic fracture / pain  Discharge Diagnoses:  Principal Problem:  *S/P right hemi HA Hypertension   GERD   Bone metastases - from breast CA  Pyelonephritis   Recurrent UTI   S/P chemotherapy  S/P radiation  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, bone and brain   Brain cancer    HPI:   [name], 46 y.o. female, has a history of pain and functional disability in the right hip(s) due to metastatic disease and patient has failed non-surgical conservative treatments for greater than 12 weeks to include NSAID's and/or analgesics, use of assistive devices, weight reduction as appropriate and activity modification. Onset of symptoms was abrupt starting 3 months years ago with rapidlly worsening course since that time.The patient noted no past surgery on the right hip(s). Patient currently rates pain in the right hip at 10 out of 10 with activity. Patient has worsening of pain with activity and weight bearing, trendelenberg gait, pain that interfers with activities of daily living and pain with passive range of motion. Patient has evidence of femoral neck fracture with possible extension in the intertrochanteric region by imaging studies. This condition presents safety issues increasing the risk of falls. This patient has had proximal femur fracture. There is no current active infection. Risks, benefits and expectations were discussed with the patient. Patient understand the risks, benefits and expectations and wishes to proceed with surgery.  PCP: No primary provider on file.   Discharged Condition: good  Hospital Course:  Patient underwent the above stated procedure on 05/04/2012. Patient  tolerated the procedure well and brought to the recovery room in good condition and subsequently to the floor.  POD #1 BP: 145/77 ; Pulse: 102 ; Temp: 99.8 F (37.7 C) ; Resp: 17  Pt's foley was removed, as well as the hemovac drain removed. IV was changed to a saline lock. Patient reports pain as moderate, she states that the current oxycodone is not controlling her pain. She would like to change to the Percocet which has helped her pain previously. No events throughout the night. Neurovascular intact, dorsiflexion/plantar flexion intact, incision: dressing C/D/I, no cellulitis present and compartment soft.   LABS  Basename  05/05/12 0442   HGB  8.0  HCT  24.3   POD #2  BP: 105/64 ; Pulse: 91 ; Temp: 98 F (36.7 C) ; Resp: 16  Patient reports pain as mild, pain is under better control with the percocet. She states that she feels much better than prior to surgery. No events throughout the night. Ready to be discharged home. Neurovascular intact, dorsiflexion/plantar flexion intact, incision: dressing C/D/I, no cellulitis present and compartment soft.   LABS  Basename  05/06/12 0415   HGB  8.3  HCT  24.7    Discharge Exam: General appearance: alert, cooperative and no distress Extremities: Homans sign is negative, no sign of DVT, no edema, redness or tenderness in the calves or thighs and no ulcers, gangrene or trophic changes  Disposition:  Home or Self Care with follow up in 2 weeks   Follow-up Information    Follow up with Shelda Pal, MD. Schedule an appointment as soon as possible for a visit in 2 weeks.   Contact information:  [address] [place] [address] [phone]          Discharge Orders    Future Appointments: Provider: Department: Dept Phone: Center:   05/13/2012 1:45 PM Radene Gunning [facility] MEDICAL ONCOLOGY [phone] None   05/13/2012 2:15 PM Amy Allegra Grana, PA [facility] MEDICAL ONCOLOGY  [phone] None   05/13/2012 3:00 PM [name], MD [facility] RADIATION ONCOLOGY [phone] None     Future Orders Please Complete By Expires   Diet - low sodium heart healthy      Call MD / Call 911      Comments:   If you experience chest pain or shortness of breath, CALL 911 and be transported to the hospital emergency room.  If you develope a fever above 101 F, pus (white drainage) or increased drainage or redness at the wound, or calf pain, call your surgeon's office.   Discharge instructions      Comments:   Maintain surgical dressing for 10-14 days, then replace with gauze and tape. Keep the area dry and clean until follow up. Follow up in 2 weeks at [facility]. Call with any questions or concerns.   Constipation Prevention      Comments:   Drink plenty of fluids.  Prune juice may be helpful.  You may use a stool softener, such as Colace (over the counter) 100 mg twice a day.  Use MiraLax (over the counter) for constipation as needed.   Increase activity slowly as tolerated      Change dressing      Comments:   Maintain surgical dressing for 10-14 days, then replace with 4x4 guaze and tape. Keep the area dry and clean.   TED hose      Comments:   Use stockings (TED hose) for 2 weeks on both leg(s).  You may remove them at night for sleeping.   Weight bearing as tolerated         Discharge Medication List as of 05/06/2012  9:06 AM    START taking these medications   Details  diphenhydrAMINE (BENADRYL) 25 mg capsule Take 1 capsule (25 mg total) by mouth every 6 (six) hours as needed for itching, allergies or sleep., Starting 05/06/2012, Until Discontinued, No Print    docusate sodium 100 MG CAPS Take 100 mg by mouth 2 (two) times daily., Starting 05/06/2012, Until Discontinued, No Print    enoxaparin (LOVENOX) 40 MG/0.4ML injection Inject 0.4 mLs (40 mg total) into the skin daily., Starting 05/06/2012, Until Discontinued, Print    ferrous  sulfate 325 (65 FE) MG tablet Take 1 tablet (325 mg total) by mouth 3 (three) times daily after meals., Starting 05/06/2012, Until Discontinued, No Print    methocarbamol (ROBAXIN) 500 MG tablet Take 1 tablet (500 mg total) by mouth every 6 (six) hours as needed (muscle spasms)., Starting 05/06/2012, Until Discontinued, Print    oxyCODONE-acetaminophen (PERCOCET/ROXICET) 5-325 MG per tablet Take 1-2 tablets by mouth every 4 (four) hours as needed for pain., Starting 05/06/2012, Until Discontinued, Print    polyethylene glycol (MIRALAX / GLYCOLAX) packet Take 17 g by mouth 2 (two) times daily., Starting 05/06/2012, Until Discontinued, No Print      CONTINUE these medications which have NOT CHANGED   Details  amLODipine (NORVASC) 5 MG tablet Take 5 mg by mouth daily before breakfast., Until Discontinued, Historical Med    Multiple Vitamin (MULTIVITAMIN WITH MINERALS) TABS Take 1 tablet by mouth daily.,  Until Discontinued, Historical Med    omeprazole (PRILOSEC) 20 MG capsule Take 1 capsule (20 mg total) by mouth daily., Starting 04/26/2012, Until Discontinued, Normal    promethazine (PHENERGAN) 25 MG tablet Take 0.5-1 tablets (12.5-25 mg total) by mouth every 6 (six) hours as needed. Nausea, Starting 03/29/2012, Until Discontinued, Normal    dexamethasone (DECADRON) 4 MG tablet Take 2 mg by mouth daily before breakfast., Until Discontinued, Historical Med    everolimus (AFINITOR) 10 MG tablet Take 1 tablet (10 mg total) by mouth daily., Starting 05/03/2012, Until Discontinued, Print    exemestane (AROMASIN) 25 MG tablet Take 1 tablet (25 mg total) by mouth daily after breakfast., Starting 05/03/2012, Until Discontinued, Print      STOP taking these medications     cyclobenzaprine (FLEXERIL) 10 MG tablet Comments:  Reason for Stopping:       ibuprofen (ADVIL,MOTRIN) 600 MG tablet Comments:  Reason for Stopping:       oxyCODONE-acetaminophen (PERCOCET) 10-325 MG per tablet Comments:  Reason  for Stopping:       oxyCODONE-acetaminophen (PERCOCET) 10-325 MG per tablet Comments:  Reason for Stopping:           Signed: Anastasio Auerbach. Johnita Palleschi   PAC  05/11/2012, 10:03 AM

## 2012-05-13 ENCOUNTER — Encounter: Payer: Self-pay | Admitting: Physician Assistant

## 2012-05-13 ENCOUNTER — Telehealth: Payer: Self-pay | Admitting: *Deleted

## 2012-05-13 ENCOUNTER — Encounter (HOSPITAL_COMMUNITY)
Admission: RE | Admit: 2012-05-13 | Discharge: 2012-05-13 | Disposition: A | Discharge status: Home / Self Care | Payer: Medicaid Other | Source: Ambulatory Visit | Attending: Oncology | Admitting: Oncology

## 2012-05-13 ENCOUNTER — Ambulatory Visit: Payer: Medicaid Other | Attending: Radiation Oncology | Admitting: Radiation Oncology

## 2012-05-13 ENCOUNTER — Other Ambulatory Visit: Payer: Medicaid Other | Admitting: Lab

## 2012-05-13 ENCOUNTER — Ambulatory Visit (HOSPITAL_BASED_OUTPATIENT_CLINIC_OR_DEPARTMENT_OTHER): Payer: Medicaid Other | Admitting: Physician Assistant

## 2012-05-13 ENCOUNTER — Other Ambulatory Visit (HOSPITAL_BASED_OUTPATIENT_CLINIC_OR_DEPARTMENT_OTHER): Payer: Medicaid Other | Admitting: Lab

## 2012-05-13 VITALS — BP 152/84 | HR 116 | Temp 98.6°F | Resp 20 | Ht 62.0 in | Wt 146.6 lb

## 2012-05-13 DIAGNOSIS — C7951 Secondary malignant neoplasm of bone: Secondary | ICD-10-CM

## 2012-05-13 DIAGNOSIS — C787 Secondary malignant neoplasm of liver and intrahepatic bile duct: Secondary | ICD-10-CM

## 2012-05-13 DIAGNOSIS — C7952 Secondary malignant neoplasm of bone marrow: Secondary | ICD-10-CM

## 2012-05-13 DIAGNOSIS — C50919 Malignant neoplasm of unspecified site of unspecified female breast: Secondary | ICD-10-CM

## 2012-05-13 DIAGNOSIS — C7931 Secondary malignant neoplasm of brain: Secondary | ICD-10-CM

## 2012-05-13 DIAGNOSIS — C50519 Malignant neoplasm of lower-outer quadrant of unspecified female breast: Secondary | ICD-10-CM

## 2012-05-13 DIAGNOSIS — D649 Anemia, unspecified: Secondary | ICD-10-CM

## 2012-05-13 DIAGNOSIS — Z5111 Encounter for antineoplastic chemotherapy: Secondary | ICD-10-CM

## 2012-05-13 LAB — COMPREHENSIVE METABOLIC PANEL (CC13)
CO2: 27 mEq/L (ref 22–29)
Calcium: 9.3 mg/dL (ref 8.4–10.4)
Creatinine: 0.7 mg/dL (ref 0.6–1.1)
Glucose: 98 mg/dl (ref 70–99)
Total Bilirubin: 0.67 mg/dL (ref 0.20–1.20)

## 2012-05-13 LAB — CBC WITH DIFFERENTIAL/PLATELET
Basophils Absolute: 0 10*3/uL (ref 0.0–0.1)
Eosinophils Absolute: 0.1 10*3/uL (ref 0.0–0.5)
HCT: 23.8 % — ABNORMAL LOW (ref 34.8–46.6)
HGB: 7.7 g/dL — ABNORMAL LOW (ref 11.6–15.9)
LYMPH%: 18.5 % (ref 14.0–49.7)
MCV: 85.9 fL (ref 79.5–101.0)
MONO%: 8.4 % (ref 0.0–14.0)
NEUT#: 3.7 10*3/uL (ref 1.5–6.5)
NEUT%: 71.7 % (ref 38.4–76.8)
Platelets: 416 10*3/uL — ABNORMAL HIGH (ref 145–400)

## 2012-05-13 MED ORDER — SODIUM CHLORIDE 0.9 % IV SOLN: Freq: Once | INTRAVENOUS | Status: DC

## 2012-05-13 MED ORDER — HEPARIN SOD (PORK) LOCK FLUSH 100 UNIT/ML IV SOLN
500.0000 [IU] | Freq: Once | INTRAVENOUS | Status: DC | PRN
  Filled 2012-05-13: qty 5

## 2012-05-13 MED ORDER — ALTEPLASE 2 MG IJ SOLR
2.0000 mg | Freq: Once | INTRAMUSCULAR | Status: DC | PRN
  Filled 2012-05-13: qty 2

## 2012-05-13 MED ORDER — OXYCODONE HCL 10 MG PO TABS: 10.0000 mg | ORAL_TABLET | ORAL | Status: DC | PRN

## 2012-05-13 MED ORDER — SODIUM CHLORIDE 0.9 % IJ SOLN
3.0000 mL | Freq: Once | INTRAMUSCULAR | Status: DC | PRN
  Filled 2012-05-13: qty 10

## 2012-05-13 MED ORDER — ZOLEDRONIC ACID 4 MG/5ML IV CONC: 4.0000 mg | Freq: Once | INTRAVENOUS | Status: DC

## 2012-05-13 MED ORDER — HEPARIN SOD (PORK) LOCK FLUSH 100 UNIT/ML IV SOLN
250.0000 [IU] | Freq: Once | INTRAVENOUS | Status: DC | PRN
  Filled 2012-05-13: qty 5

## 2012-05-13 MED ORDER — GOSERELIN ACETATE 10.8 MG ~~LOC~~ IMPL
10.8000 mg | DRUG_IMPLANT | Freq: Once | SUBCUTANEOUS | Status: AC
  Administered 2012-05-13: 10.8 mg via SUBCUTANEOUS
  Filled 2012-05-13: qty 10.8

## 2012-05-13 MED ORDER — FULVESTRANT 250 MG/5ML IM SOLN: 500.0000 mg | Freq: Once | INTRAMUSCULAR | Status: DC

## 2012-05-13 MED ORDER — SODIUM CHLORIDE 0.9 % IJ SOLN
10.0000 mL | INTRAMUSCULAR | Status: DC | PRN
  Filled 2012-05-13: qty 10

## 2012-05-13 NOTE — Progress Notes (Signed)
ID: [name]   DOB: 16-Apr-1966  MR#: [id number]  CSN#:[id number]  HISTORY OF PRESENT ILLNESS: The patient delivered a child on 05/17/2009, after which she felt fullness in the right breast. She eventually brought this to her doctors attention and was scheduled for mammography and ultrasound on 03/08/2009. There were no prior exams for comparison. Mammography showed the breast to be dense, with a 5 cm obscured mass in the outer midportion of the right breast. Mass was easily palpable, and there was also a mass in the outer right periareolar region. They were suspicious microcalcifications in the outer midportion of the right breast, as well as within the mass itself. Ultrasound showed an extensive heterogeneous area, corresponding to the palpable abnormality with multiple solid and cystic foci. Overall, the mass measured in excess of 5 cm, in addition to cysts in the breast.  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 The tumors were ER +100%, PR +80%.  Bilateral breast MRI was obtained 03/22/2009. There was a large area of abnormal enhancement in the lower outer quadrant of the right breast measuring up to 9.7 cm. No abnormal appearing lymph nodes and no other masses in either breast.  Patient underwent definitive right mastectomy and sentinel lymph node sampling on 04/29/2009 under the care of Dr. Derrell Lolling. 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 Repeat prognostic panel showed tumor to be ER +93% and PR +51%. HER-2/neu was negative with a ratio of 0.71, with proliferation marker of 36%.  Patient was evaluated by Dr. Darnelle Catalan in January 2011 but failed to keep appointments until she was seen here again in July 2011. In July, she began adjuvant chemotherapy. She received 4 cycles of dose dense  doxorubicin and cyclophosphamide followed by 4 weekly doses of paclitaxel which was discontinued in November 2011 secondary to peripheral neuropathy. The patient then received radiation therapy to the right chest wall which was truncated due to problems with missing appointments.  Patient was started on tamoxifen briefly in June of 2012 which was discontinued due to nausea. The nausea continued, and an abdominal CT in October 2012 confirmed a growth in liver lesions. These were biopsied on 03/10/2011 and showed metastatic adenocarcinoma, still ER positive at 100%, PR +5%. Her subsequent treatments are as detailed below  INTERVAL HISTORY: Kaeley returns today  for followup of her metastatic breast carcinoma. Interval history is remarkable for Syeda having undergone a right hip arthroplasty under the care of Dr. Charlann Boxer on 05/04/2012. She tolerated the procedure well with no complications. She is recovering well, and although she still has some pain in the right hip and right leg, the pain has improved, and she is able to ambulate a little easier with her walker.   Catori was very tearful and anxious on presentation today. She is concerned about her living situation. She's currently in a 2 story apartment which causes increased pain and makes her activities of daily living extremely difficult. Of course she is a very high risk for falls as well. She is hoping to get moved to a one story apartment soon. She has not been able to see her daughter recently since she is [place] "[place] town" and does not have access to a car. This in itself is increasing Salia's depression. We discussed antidepressants but she tells me she "just needs her daughter".  REVIEW OF SYSTEMS:  Marlean is still weak and is very tired today. She has shortness of breath with exertion. She denies any cough since had no chest pain. She's had no fevers or chills. She's had no significant hot flashes. She has occasional nausea but no emesis.  She's having regular bowel movements with the use of glycerin suppositories. She's had no signs of abnormal bleeding. She denies any rashes or skin changes. The surgical dressing is still intact in the right hip. She denies any recent headaches, dizziness, or change in vision. She continues to have some swelling in the right lower extremity which is a chronic issue. This decreases with elevation. She has not yet made an appointment to see her dentist, which is keeping her from doing her zoledronic acid.   A detailed review of systems was otherwise stable and noncontributory.   PAST MEDICAL HISTORY: Past Medical History  Diagnosis Date  . Hypertension   . GERD (gastroesophageal reflux disease)   . 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 Pyelonephritis 10/01/11  . Recurrent UTI (urinary tract infection) 10/01/11  . S/P chemotherapy, time since 4-12 weeks   . S/P radiation > 12 weeks   . 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 Brain cancer     spread from breast ca    PAST SURGICAL HISTORY: Past Surgical History  Procedure Date  . Cesarean section 1989; 2010  . Mastectomy 2011    right  . Back surgery 2012    "removed tumor,cancer, from my spine  . Port-a-cath removal 2012    right chest  . Portacath placement 2011    right  . Tubal ligation 2010  . Breast biopsy 2011    right  . Hip arthroplasty 05/04/2012    Procedure: ARTHROPLASTY BIPOLAR HIP;  Surgeon: Shelda Pal, MD;  Location: [facility];  Service: Orthopedics;  Laterality: Right;  RIGHT HIP HEMI ARTHROPLASTY     FAMILY HISTORY The patient's father is alive at age 77.  The patient's mother is alive at age 95.  She has four sisters.  She had one brother who died from colon cancer at the age of 58.  There is no breast or ovarian cancer history in the family to her knowledge.  GYNECOLOGIC HISTORY: She is GX P2.  First pregnancy to term at age 81.    SOCIAL HISTORY: The patient is not  employed.  She lives by herself with her small son whose name is [name] (he is currently staying with his dad in [place]).  Her first child, [name], is studying business in this area.  The patient has one grandchild.  She attends the [employer].    ADVANCED DIRECTIVES: not in place  HEALTH MAINTENANCE: History  Substance Use Topics  . Smoking status: Former Smoker -- 0.2 packs/day for 31 years    Types: Cigarettes    Quit date: 04/26/2012  . Smokeless tobacco: Never Used  . Alcohol Use: No     Colonoscopy:  PAP:  Bone density:  Lipid panel:  No Known Allergies  Current Outpatient Prescriptions  Medication Sig Dispense Refill  . amLODipine (NORVASC) 5 MG tablet Take 5 mg by mouth daily before breakfast.      . dexamethasone (DECADRON) 4 MG tablet Take 2 mg by mouth daily before breakfast.      . diphenhydrAMINE (BENADRYL) 25 mg capsule Take 1 capsule (25 mg total) by mouth every 6 (six) hours as  needed for itching, allergies or sleep.  30 capsule    . docusate sodium 100 MG CAPS Take 100 mg by mouth 2 (two) times daily.  10 capsule    . enoxaparin (LOVENOX) 40 MG/0.4ML injection Inject 0.4 mLs (40 mg total) into the skin daily.  12 Syringe  0  . exemestane (AROMASIN) 25 MG tablet Take 1 tablet (25 mg total) by mouth daily after breakfast.  30 tablet  12  . ferrous sulfate 325 (65 FE) MG tablet Take 1 tablet (325 mg total) by mouth 3 (three) times daily after meals.      . methocarbamol (ROBAXIN) 500 MG tablet Take 1 tablet (500 mg total) by mouth every 6 (six) hours as needed (muscle spasms).  50 tablet  0  . Multiple Vitamin (MULTIVITAMIN WITH MINERALS) TABS Take 1 tablet by mouth daily.      . polyethylene glycol (MIRALAX / GLYCOLAX) packet Take 17 g by mouth 2 (two) times daily.  14 each    . promethazine (PHENERGAN) 25 MG tablet Take 0.5-1 tablets (12.5-25 mg total) by mouth every 6 (six) hours as needed. Nausea  30 tablet  1  . everolimus (AFINITOR) 10 MG tablet Take 1  tablet (10 mg total) by mouth daily.  30 tablet  6  . omeprazole (PRILOSEC) 20 MG capsule Take 1 capsule (20 mg total) by mouth daily.  30 capsule  6  . oxyCODONE 10 MG TABS Take 1 tablet (10 mg total) by mouth every 4 (four) hours as needed for pain.  120 tablet  0  . [DISCONTINUED] Alum & Mag Hydroxide-Simeth (MAGIC MOUTHWASH W/LIDOCAINE) SOLN Take 5 mLs by mouth 3 (three) times daily as needed (take 15 minutes prior to meals.).  480 mL  0   Current Facility-Administered Medications  Medication Dose Route Frequency Provider Last Rate Last Dose  . alteplase (CATHFLO ACTIVASE) injection 2 mg  2 mg Intracatheter Once PRN Catalina Gravel, PA      . heparin lock flush 100 unit/mL  500 Units Intracatheter Once PRN Catalina Gravel, PA      . heparin lock flush 100 unit/mL  250 Units Intracatheter Once PRN Catalina Gravel, PA      . sodium chloride 0.9 % injection 10 mL  10 mL Intracatheter PRN Jacion Dismore Allegra Grana, PA      . sodium chloride 0.9 % injection 3 mL  3 mL Intravenous Once PRN Catalina Gravel, PA        OBJECTIVE: Middle-aged [place] American woman with significant pain on weightbearing, appearing tired and anxious Filed Vitals:   05/13/12 1446  BP: 152/84  Pulse: 116  Temp: 98.6 F (37 C)  Resp: 20     Body mass index is 26.81 kg/(m^2).    ECOG FS:2   Filed Weights   05/13/12 1446  Weight: 146 lb 9.6 oz (66.497 kg)   Sclerae unicteric Oropharynx clear No cervical or supraclavicular adenopathy Lungs no rales or rhonchi Heart regular rate and rhythm Abdomen nontender, positive bowel sounds MSK  exam limited by pain, limited but improved range of motion in the left lower extremity secondary to pain Neuro: nonfocal, alert and oriented x3 Breasts: Deferred   LAB RESULTS: Lab Results  Component Value Date   WBC 5.1 05/13/2012   NEUTROABS 3.7 05/13/2012   HGB 7.7* 05/13/2012   HCT 23.8* 05/13/2012   MCV 85.9 05/13/2012   PLT 416* 05/13/2012      Chemistry  Component Value Date/Time    NA 138 05/13/2012 1351   NA 138 05/06/2012 0415   K 3.9 05/13/2012 1351   K 3.4* 05/06/2012 0415   CL 100 05/13/2012 1351   CL 104 05/06/2012 0415   CO2 27 05/13/2012 1351   CO2 27 05/06/2012 0415   BUN 9.0 05/13/2012 1351   BUN 10 05/06/2012 0415   CREATININE 0.7 05/13/2012 1351   CREATININE 0.68 05/06/2012 0415      Component Value Date/Time   CALCIUM 9.3 05/13/2012 1351   CALCIUM 8.9 05/06/2012 0415   ALKPHOS 364* 05/13/2012 1351   ALKPHOS 259* 05/03/2012 1100   AST 39* 05/13/2012 1351   AST 37 05/03/2012 1100   ALT 25 05/13/2012 1351   ALT 31 05/03/2012 1100   BILITOT 0.67 05/13/2012 1351   BILITOT 0.2* 05/03/2012 1100       Lab Results  Component Value Date   LABCA2 21 09/09/2011    STUDIES:  05/04/2012 *RADIOLOGY REPORT*  Clinical Data: Status post right hip arthroplasty.  PORTABLE PELVIS  Comparison: None.  Findings: Right hip bipolar hemiarthroplasty shows normal  alignment. No postoperative complications are evident.  IMPRESSION:  Normal alignment of right hip arthroplasty.  Original Report Authenticated By: Irish Lack, M.D.     Ct Chest W Contrast  04/20/2012  *RADIOLOGY REPORT*  Clinical Data:  Breast cancer with liver and bone metastasis.  CT CHEST, ABDOMEN AND PELVIS WITH CONTRAST  Technique:  Multidetector CT imaging of the chest, abdomen and pelvis was performed following the standard protocol during bolus administration of intravenous contrast.  Contrast: OMNIPAQUE IOHEXOL 300 MG/ML  SOLN  Comparison:  10/01/2011.  CT CHEST  Findings:  Examination of the chest wall demonstrates a right mastectomy defect.  A right lymph node dissection is noted without findings for chest wall tumor or axillary adenopathy.  No enlarged supraclavicular lymph nodes.  The left breast is unremarkable.  Diffuse osseous metastatic disease is noted.  No pathologic fracture identified. Wide decompressive laminectomy is again noted from T9-T11.  The heart is normal in size.  No  pericardial effusion.  No mediastinal or hilar lymphadenopathy.  Small scattered lymph nodes are stable.  The esophagus is grossly normal.  The aorta is normal in caliber.  No dissection.  Examination of the lung parenchyma demonstrates mild interstitial thickening but no findings for pulmonary metastatic disease or acute pulmonary abnormality.  IMPRESSION:  1.  Diffuse osseous metastatic disease without pathologic fracture or canal stenosis. 2.  Wide decompressive laminectomies from T9-T11. 3.  No findings for recurrent chest wall disease, axillary adenopathy or pulmonary metastasis.  CT ABDOMEN AND PELVIS  Findings:  Progressive hepatic metastatic disease is demonstrated with diffuse tumor involvement of both hepatic lobes the spleen is normal.  The pancreas is normal.  There are left renal calculi but no obstructing ureteral calculi.  No renal lesions.  The stomach is not well distended with contrast but no gross abnormalities are seen.  The small bowel and colon grossly normal.  A fibroid uterus is again demonstrated with a calcified fibroid in the fundus.  The ovaries are normal.  The bladder is normal.  No pelvic mass, adenopathy or free pelvic fluid collections.  No inguinal mass or hernia.  The aorta is normal in caliber.  The major branch vessels are patent.  No mesenteric or retroperitoneal mass or adenopathy.  Diffuse osseous metastatic disease is again demonstrated. Mild progression overall.  There appears to be an enlarging metastatic lesion in the  right hip and a pathologic basicervical neck fracture is suspected.  IMPRESSION:  1.  Marked progression of hepatic metastatic disease. 2.  Progressive metastatic lesion in the right femoral neck and intertrochanteric region with a pathologic nondisplaced fracture through the base of the neck.   Original Report Authenticated By: Rudie Meyer, M.D.    Ct Abdomen Pelvis W Contrast  04/20/2012  *RADIOLOGY REPORT*  Clinical Data:  Breast cancer with liver and  bone metastasis.  CT CHEST, ABDOMEN AND PELVIS WITH CONTRAST  Technique:  Multidetector CT imaging of the chest, abdomen and pelvis was performed following the standard protocol during bolus administration of intravenous contrast.  Contrast: OMNIPAQUE IOHEXOL 300 MG/ML  SOLN  Comparison:  10/01/2011.  CT CHEST  Findings:  Examination of the chest wall demonstrates a right mastectomy defect.  A right lymph node dissection is noted without findings for chest wall tumor or axillary adenopathy.  No enlarged supraclavicular lymph nodes.  The left breast is unremarkable.  Diffuse osseous metastatic disease is noted.  No pathologic fracture identified. Wide decompressive laminectomy is again noted from T9-T11.  The heart is normal in size.  No pericardial effusion.  No mediastinal or hilar lymphadenopathy.  Small scattered lymph nodes are stable.  The esophagus is grossly normal.  The aorta is normal in caliber.  No dissection.  Examination of the lung parenchyma demonstrates mild interstitial thickening but no findings for pulmonary metastatic disease or acute pulmonary abnormality.  IMPRESSION:  1.  Diffuse osseous metastatic disease without pathologic fracture or canal stenosis. 2.  Wide decompressive laminectomies from T9-T11. 3.  No findings for recurrent chest wall disease, axillary adenopathy or pulmonary metastasis.  CT ABDOMEN AND PELVIS  Findings:  Progressive hepatic metastatic disease is demonstrated with diffuse tumor involvement of both hepatic lobes the spleen is normal.  The pancreas is normal.  There are left renal calculi but no obstructing ureteral calculi.  No renal lesions.  The stomach is not well distended with contrast but no gross abnormalities are seen.  The small bowel and colon grossly normal.  A fibroid uterus is again demonstrated with a calcified fibroid in the fundus.  The ovaries are normal.  The bladder is normal.  No pelvic mass, adenopathy or free pelvic fluid collections.  No  inguinal mass or hernia.  The aorta is normal in caliber.  The major branch vessels are patent.  No mesenteric or retroperitoneal mass or adenopathy.  Diffuse osseous metastatic disease is again demonstrated. Mild progression overall.  There appears to be an enlarging metastatic lesion in the right hip and a pathologic basicervical neck fracture is suspected.  IMPRESSION:  1.  Marked progression of hepatic metastatic disease. 2.  Progressive metastatic lesion in the right femoral neck and intertrochanteric region with a pathologic nondisplaced fracture through the base of the neck.   Original Report Authenticated By: Rudie Meyer, M.D.     ASSESSMENT:   46 y.o.  [place] woman    1. 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    2.Status post adjuvant chemotherapy July to November 2011, consisting of 4 cycles of dose dense doxorubicin and cyclophosphamide, followed by 4 of 12 planned weekly doses of paclitaxel given adjuvantly, discontinued secondary to peripheral neuropathy.    3. Status post radiation to the right chest wall, truncated due to problems with the  patient missing appointments.   4. Tamoxifen started June of 2012, but discontinued due to nausea.    5. metastatic disease to liver and bone pathologically documented October of 2012, estrogen receptor 100% positive, progesterone receptor 7% positive, with no HER-2 amplification;  treatment in the metastatic setting has consisted of:   6.Faslodex started 03/16/2011, discontinued November 2013 with progression. Zoladex every 3 months also started on 03/16/2011, and monthly zoledronic acid started November 2011, the Zometa I currently held due to pending dental procedures and possible extractions.             7. Status post thoracic laminectomy,  T9-T11, for impending cord compression on 03/21/2011, followed by irradiation to the spine completed November 2011  8. 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 s/p palliative radiation to the Right hip clivus to 30 Gy completed 04/07/2012  10.  Progression noted by scans in November 2013, at which time Faslodex was discontinued. Patient was started on exemestane in early December, the plan being to add everolimus in mid December. Continues to receive Zoladex injections every 3 months.  11. Nondisplaced right femoral neck fracture, status post right hip arthroplasty on 05/04/2012 under the care of Dr. Charlann Boxer.       PLAN:  Windy is recovering well from her right hip arthroplasty, but nonetheless, continues to have pain as well as multiple complaints and concerns. Over half of our 1 hour appointment today was spent counseling the patient regarding these issues and discussing possible solutions/treatment.  She has been using oxycodone/APAP for pain, and needs a refill on her pain medication. We are going to try to drop the Tylenol from the equation due to the liver mets, and see if oxycodone 10 mg, 1 every 4 hours, will control Texanna's pain adequately. She was given a prescription accordingly today.  She will continue on the exemestane which she seems to be tolerating well. We hope that her everolimus will arrive from the pharmacy by early next week, and she will start that medication as soon she gets it.  She's also due for her next Zoladex injection on December 17, and we will go ahead and give that to her today.  Lynise will return tomorrow for transfusion of 2 units packed red blood cells for symptomatic anemia and a hemoglobin of 7.7 today. We will plan on seeing her back on December 23 for repeat labs and followup visit, also to assess tolerance of the everolimus at that point.    Candy is currently residing in a 2 story apartment, and I have written a letter to her landlord today requesting a move to a one story apartment due to her compromised ability to ambulate and her high risk of falls. We are also requesting a home health consult for physical therapy, occupational therapy, surgical wound management, emotional support/social work consult, and medication management.   All this was reviewed in detail with Tsion today, and she was given all of this information in writing. She voiced her understanding of the above, and will call with any changes or problems.  Hudsyn Champine    05/13/2012

## 2012-05-13 NOTE — Telephone Encounter (Signed)
Per desk RN voicemail I have scheduled appt for tomorrow. JMW

## 2012-05-13 NOTE — Progress Notes (Signed)
This RN refaxed prescription for afinitor per protocol.  Also faxed order to [facility] for home health services for PT/OT due to recent hip replacement, nurse for wound care for surgical site in compromised patient and for social worker known dynamic psycho social life issues.

## 2012-05-13 NOTE — Telephone Encounter (Signed)
Placed  Patient on the lab schedule to get type and cross   Gave patient appointment for 05-23-2012 [name] patient appointment for 06-09-2012  berry

## 2012-05-14 ENCOUNTER — Telehealth: Payer: Self-pay | Admitting: Medical Oncology

## 2012-05-14 ENCOUNTER — Ambulatory Visit (HOSPITAL_BASED_OUTPATIENT_CLINIC_OR_DEPARTMENT_OTHER): Payer: Medicaid Other

## 2012-05-14 VITALS — BP 109/74 | HR 96 | Temp 98.9°F | Resp 18

## 2012-05-14 DIAGNOSIS — D649 Anemia, unspecified: Secondary | ICD-10-CM

## 2012-05-14 LAB — CANCER ANTIGEN 27.29: CA 27.29: 62 U/mL — ABNORMAL HIGH (ref 0–39)

## 2012-05-14 MED ORDER — DIPHENHYDRAMINE HCL 25 MG PO CAPS
25.0000 mg | ORAL_CAPSULE | Freq: Once | ORAL | Status: AC
  Administered 2012-05-14: 25 mg via ORAL

## 2012-05-14 MED ORDER — ACETAMINOPHEN 325 MG PO TABS
650.0000 mg | ORAL_TABLET | Freq: Once | ORAL | Status: AC
  Administered 2012-05-14: 650 mg via ORAL

## 2012-05-14 MED ORDER — SODIUM CHLORIDE 0.9 % IV SOLN
250.0000 mL | Freq: Once | INTRAVENOUS | Status: AC
  Administered 2012-05-14: 250 mL via INTRAVENOUS

## 2012-05-14 NOTE — Telephone Encounter (Signed)
Pt on her way

## 2012-05-14 NOTE — Patient Instructions (Addendum)
Blood Transfusion   A blood transfusion replaces your blood or some of its parts. Blood is replaced when you have lost blood because of surgery, an accident, or for severe blood conditions like anemia.  You can donate blood to be used on yourself if you have a planned surgery. If you lose blood during that surgery, your own blood can be given back to you.  Any blood given to you is checked to make sure it matches your blood type. Your temperature, blood pressure, and heart rate (vital signs) will be checked often.   GET HELP RIGHT AWAY IF:    You feel sick to your stomach (nauseous) or throw up (vomit).   You have watery poop (diarrhea).   You have shortness of breath or trouble breathing.   You have blood in your pee (urine) or have dark colored pee.   You have chest pain or tightness.   Your eyes or skin turn yellow (jaundice).   You have a temperature by mouth above 102 F (38.9 C), not controlled by medicine.   You start to shake and have chills.   You develop a a red rash (hives) or feel itchy.   You develop lightheadedness or feel confused.   You develop back, joint, or muscle pain.   You do not feel hungry (lost appetite).   You feel tired, restless, or nervous.   You develop belly (abdominal) cramps.  Document Released: 08/14/2008 Document Revised: 08/10/2011 Document Reviewed: 08/14/2008  [employer] Patient Information 2013 [employer].

## 2012-05-15 LAB — TYPE AND SCREEN
ABO/RH(D): A POS
Unit division: 0

## 2012-05-16 ENCOUNTER — Other Ambulatory Visit: Payer: Self-pay | Admitting: *Deleted

## 2012-05-16 MED ORDER — OXYCODONE-ACETAMINOPHEN 5-325 MG PO TABS: 1.0000 | ORAL_TABLET | Freq: Three times a day (TID) | ORAL | Status: DC | PRN

## 2012-05-16 NOTE — Telephone Encounter (Signed)
Pt called to this RN to state unable to get oxycodone refilled at [facility]-  She is also concerned about not having percocet for pain which is what works best for her- Renessa states oxycodone was used in the hospital " and it really didn't work as good as the Microbiologist ".  This RN called [facility] and discussed above situation with Aneta Mins stating situation occuring with increase in cost of generics not being reimbused properly by insurance ( including medicaid ) therefore pharmacies are limiting dispensing of medications so as no to lose money. He has reviewed Rahma's prescription and noted it will be reimbursed and will fill medication.  Called and discussed above with Amillia as well as her concern with need for percocet. This RN validated pt's concerns as well as discussed need for least amount of tylenol due to liver concerns.  Plan per discussion is pt will use no more then 4 oxycodone a day with the 10mg  oxycodone.  Prescription taken to inj nurse for pick up.

## 2012-05-23 ENCOUNTER — Other Ambulatory Visit (HOSPITAL_BASED_OUTPATIENT_CLINIC_OR_DEPARTMENT_OTHER): Payer: Medicaid Other | Admitting: Lab

## 2012-05-23 ENCOUNTER — Inpatient Hospital Stay (HOSPITAL_COMMUNITY)
Admission: AD | Admit: 2012-05-23 | Discharge: 2012-05-25 | DRG: 948 | Disposition: A | Discharge status: Home Health | Payer: Medicaid Other | Source: Ambulatory Visit | Attending: Oncology | Admitting: Oncology

## 2012-05-23 ENCOUNTER — Telehealth: Payer: Self-pay | Admitting: *Deleted

## 2012-05-23 ENCOUNTER — Ambulatory Visit: Payer: Medicaid Other | Admitting: Oncology

## 2012-05-23 ENCOUNTER — Encounter (HOSPITAL_COMMUNITY): Payer: Self-pay

## 2012-05-23 VITALS — BP 140/90 | HR 110 | Temp 98.9°F | Resp 20 | Ht 62.0 in | Wt 138.1 lb

## 2012-05-23 DIAGNOSIS — C787 Secondary malignant neoplasm of liver and intrahepatic bile duct: Secondary | ICD-10-CM | POA: Diagnosis present

## 2012-05-23 DIAGNOSIS — D649 Anemia, unspecified: Secondary | ICD-10-CM

## 2012-05-23 DIAGNOSIS — C7951 Secondary malignant neoplasm of bone: Secondary | ICD-10-CM

## 2012-05-23 DIAGNOSIS — Z7901 Long term (current) use of anticoagulants: Secondary | ICD-10-CM

## 2012-05-23 DIAGNOSIS — Z923 Personal history of irradiation: Secondary | ICD-10-CM

## 2012-05-23 DIAGNOSIS — C50919 Malignant neoplasm of unspecified site of unspecified female breast: Secondary | ICD-10-CM

## 2012-05-23 DIAGNOSIS — K219 Gastro-esophageal reflux disease without esophagitis: Secondary | ICD-10-CM | POA: Diagnosis present

## 2012-05-23 DIAGNOSIS — E876 Hypokalemia: Secondary | ICD-10-CM | POA: Diagnosis not present

## 2012-05-23 DIAGNOSIS — H532 Diplopia: Secondary | ICD-10-CM | POA: Diagnosis present

## 2012-05-23 DIAGNOSIS — Z96649 Presence of unspecified artificial hip joint: Secondary | ICD-10-CM

## 2012-05-23 DIAGNOSIS — N12 Tubulo-interstitial nephritis, not specified as acute or chronic: Secondary | ICD-10-CM

## 2012-05-23 DIAGNOSIS — R52 Pain, unspecified: Secondary | ICD-10-CM

## 2012-05-23 DIAGNOSIS — C7931 Secondary malignant neoplasm of brain: Secondary | ICD-10-CM | POA: Diagnosis present

## 2012-05-23 DIAGNOSIS — Z853 Personal history of malignant neoplasm of breast: Secondary | ICD-10-CM

## 2012-05-23 DIAGNOSIS — C7952 Secondary malignant neoplasm of bone marrow: Secondary | ICD-10-CM

## 2012-05-23 DIAGNOSIS — Z79818 Long term (current) use of other agents affecting estrogen receptors and estrogen levels: Secondary | ICD-10-CM

## 2012-05-23 DIAGNOSIS — Z87891 Personal history of nicotine dependence: Secondary | ICD-10-CM

## 2012-05-23 DIAGNOSIS — G893 Neoplasm related pain (acute) (chronic): Principal | ICD-10-CM | POA: Diagnosis present

## 2012-05-23 DIAGNOSIS — Z79811 Long term (current) use of aromatase inhibitors: Secondary | ICD-10-CM

## 2012-05-23 DIAGNOSIS — Z9010 Acquired absence of unspecified breast and nipple: Secondary | ICD-10-CM

## 2012-05-23 DIAGNOSIS — I10 Essential (primary) hypertension: Secondary | ICD-10-CM

## 2012-05-23 DIAGNOSIS — S72009D Fracture of unspecified part of neck of unspecified femur, subsequent encounter for closed fracture with routine healing: Secondary | ICD-10-CM

## 2012-05-23 DIAGNOSIS — Z9221 Personal history of antineoplastic chemotherapy: Secondary | ICD-10-CM

## 2012-05-23 DIAGNOSIS — Z170 Estrogen receptor positive status [ER+]: Secondary | ICD-10-CM

## 2012-05-23 LAB — CBC
Hemoglobin: 10.5 g/dL — ABNORMAL LOW (ref 12.0–15.0)
MCH: 28.4 pg (ref 26.0–34.0)
MCHC: 32.8 g/dL (ref 30.0–36.0)
MCV: 86.5 fL (ref 78.0–100.0)

## 2012-05-23 LAB — URINALYSIS, ROUTINE W REFLEX MICROSCOPIC
Bilirubin Urine: NEGATIVE
Hgb urine dipstick: NEGATIVE
Ketones, ur: NEGATIVE mg/dL
Nitrite: NEGATIVE
Protein, ur: NEGATIVE mg/dL
Specific Gravity, Urine: 1.008 (ref 1.005–1.030)
Urobilinogen, UA: 2 mg/dL — ABNORMAL HIGH (ref 0.0–1.0)

## 2012-05-23 LAB — CBC WITH DIFFERENTIAL/PLATELET
Basophils Absolute: 0 10*3/uL (ref 0.0–0.1)
Eosinophils Absolute: 0 10*3/uL (ref 0.0–0.5)
HCT: 33.4 % — ABNORMAL LOW (ref 34.8–46.6)
LYMPH%: 18.4 % (ref 14.0–49.7)
MONO#: 0.4 10*3/uL (ref 0.1–0.9)
NEUT#: 4.4 10*3/uL (ref 1.5–6.5)
NEUT%: 74.4 % (ref 38.4–76.8)
Platelets: 452 10*3/uL — ABNORMAL HIGH (ref 145–400)
WBC: 5.8 10*3/uL (ref 3.9–10.3)

## 2012-05-23 LAB — COMPREHENSIVE METABOLIC PANEL (CC13)
CO2: 26 mEq/L (ref 22–29)
Creatinine: 0.7 mg/dL (ref 0.6–1.1)
Glucose: 108 mg/dl — ABNORMAL HIGH (ref 70–99)
Total Bilirubin: 0.48 mg/dL (ref 0.20–1.20)
Total Protein: 7.6 g/dL (ref 6.4–8.3)

## 2012-05-23 LAB — CREATININE, SERUM: Creatinine, Ser: 0.63 mg/dL (ref 0.50–1.10)

## 2012-05-23 MED ORDER — DOCUSATE SODIUM 100 MG PO CAPS
100.0000 mg | ORAL_CAPSULE | Freq: Two times a day (BID) | ORAL | Status: DC
  Administered 2012-05-23: 100 mg via ORAL
  Filled 2012-05-23 (×3): qty 1

## 2012-05-23 MED ORDER — EVEROLIMUS 10 MG PO TABS: 10.0000 mg | ORAL_TABLET | Freq: Every day | ORAL | Status: DC

## 2012-05-23 MED ORDER — ADULT MULTIVITAMIN W/MINERALS CH
1.0000 | ORAL_TABLET | Freq: Every day | ORAL | Status: DC
  Administered 2012-05-23 – 2012-05-25 (×3): 1 via ORAL
  Filled 2012-05-23 (×3): qty 1

## 2012-05-23 MED ORDER — SODIUM CHLORIDE 0.9 % IV SOLN
60.0000 mg | Freq: Once | INTRAVENOUS | Status: AC
  Administered 2012-05-23: 60 mg via INTRAVENOUS
  Filled 2012-05-23: qty 20

## 2012-05-23 MED ORDER — EXEMESTANE 25 MG PO TABS
25.0000 mg | ORAL_TABLET | Freq: Every day | ORAL | Status: DC
  Administered 2012-05-23 – 2012-05-25 (×3): 25 mg via ORAL
  Filled 2012-05-23 (×5): qty 1

## 2012-05-23 MED ORDER — ENOXAPARIN SODIUM 40 MG/0.4ML ~~LOC~~ SOLN: 40.0000 mg | SUBCUTANEOUS | Status: DC

## 2012-05-23 MED ORDER — ENOXAPARIN SODIUM 40 MG/0.4ML ~~LOC~~ SOLN
40.0000 mg | Freq: Every day | SUBCUTANEOUS | Status: DC
  Administered 2012-05-23 – 2012-05-25 (×3): 40 mg via SUBCUTANEOUS
  Filled 2012-05-23 (×4): qty 0.4

## 2012-05-23 MED ORDER — OXYCODONE-ACETAMINOPHEN 5-325 MG PO TABS
2.0000 | ORAL_TABLET | Freq: Four times a day (QID) | ORAL | Status: DC | PRN
  Administered 2012-05-23 – 2012-05-25 (×7): 2 via ORAL
  Filled 2012-05-23 (×7): qty 2

## 2012-05-23 MED ORDER — ZOLPIDEM TARTRATE 5 MG PO TABS: 5.0000 mg | ORAL_TABLET | Freq: Every evening | ORAL | Status: DC | PRN

## 2012-05-23 MED ORDER — AMLODIPINE BESYLATE 5 MG PO TABS
5.0000 mg | ORAL_TABLET | Freq: Every day | ORAL | Status: DC
  Administered 2012-05-23 – 2012-05-25 (×3): 5 mg via ORAL
  Filled 2012-05-23 (×3): qty 1

## 2012-05-23 MED ORDER — SODIUM CHLORIDE 0.9 % IV SOLN
INTRAVENOUS | Status: DC
  Administered 2012-05-23: 17:00:00 via INTRAVENOUS

## 2012-05-23 MED ORDER — EVEROLIMUS 10 MG PO TABS
5.0000 mg | ORAL_TABLET | Freq: Every day | ORAL | Status: DC
  Administered 2012-05-24 – 2012-05-25 (×2): 5 mg via ORAL

## 2012-05-23 MED ORDER — PROMETHAZINE HCL 25 MG PO TABS
25.0000 mg | ORAL_TABLET | Freq: Four times a day (QID) | ORAL | Status: DC | PRN
  Administered 2012-05-25: 25 mg via ORAL
  Filled 2012-05-23: qty 1

## 2012-05-23 MED ORDER — POLYETHYLENE GLYCOL 3350 17 G PO PACK
17.0000 g | PACK | Freq: Two times a day (BID) | ORAL | Status: DC
  Administered 2012-05-23 – 2012-05-24 (×2): 17 g via ORAL
  Filled 2012-05-23 (×5): qty 1

## 2012-05-23 MED ORDER — METHADONE HCL 5 MG PO TABS
5.0000 mg | ORAL_TABLET | Freq: Three times a day (TID) | ORAL | Status: DC
  Administered 2012-05-23 (×2): 5 mg via ORAL
  Filled 2012-05-23 (×2): qty 1

## 2012-05-23 MED ORDER — POLYETHYLENE GLYCOL 3350 17 G PO PACK
17.0000 g | PACK | Freq: Every day | ORAL | Status: DC | PRN
  Filled 2012-05-23: qty 1

## 2012-05-23 MED ORDER — PANTOPRAZOLE SODIUM 40 MG PO TBEC
40.0000 mg | DELAYED_RELEASE_TABLET | Freq: Every day | ORAL | Status: DC
  Administered 2012-05-23 – 2012-05-25 (×3): 40 mg via ORAL
  Filled 2012-05-23 (×3): qty 1

## 2012-05-23 MED ORDER — DIPHENHYDRAMINE HCL 50 MG/ML IJ SOLN
50.0000 mg | INTRAMUSCULAR | Status: DC | PRN
  Administered 2012-05-23: 50 mg via INTRAVENOUS
  Filled 2012-05-23: qty 1

## 2012-05-23 NOTE — Telephone Encounter (Signed)
This RN received call from pt stating she is unable to get transportation for visit today. Pt is tearful and verbally emotional stating " and I really need to come in ". Claudean states she has attempted to contact [facility] but has been unsuccessful.  This RN contacted Lauren and discussed above. Obtained clearance to provide transportation via [employer].  This RN called and discussed the above with pt and verified location. Pt states she is not at her residence but " at my sisters " address obtained as [address].  Taxi voucher obtianed , called [employer] and voucher faxed.  Patient aware of the above.

## 2012-05-23 NOTE — Progress Notes (Signed)
ID: [name]   DOB: August 02, 1965  MR#: [id number]  WUJ#:[id number]  HISTORY OF PRESENT ILLNESS: The patient delivered a child on 05/17/2009, after which she felt fullness in the right breast. She eventually brought this to her doctors attention and was scheduled for mammography and ultrasound on 03/08/2009. There were no prior exams for comparison. Mammography showed the breast to be dense, with a 5 cm obscured mass in the outer midportion of the right breast. Mass was easily palpable, and there was also a mass in the outer right periareolar region. They were suspicious microcalcifications in the outer midportion of the right breast, as well as within the mass itself. Ultrasound showed an extensive heterogeneous area, corresponding to the palpable abnormality with multiple solid and cystic foci. Overall, the mass measured in excess of 5 cm, in addition to cysts in the breast.  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 The tumors were ER +100%, PR +80%.  Bilateral breast MRI was obtained 03/22/2009. There was a large area of abnormal enhancement in the lower outer quadrant of the right breast measuring up to 9.7 cm. No abnormal appearing lymph nodes and no other masses in either breast.  Patient underwent definitive right mastectomy and sentinel lymph node sampling on 04/29/2009 under the care of Dr. Derrell Lolling. 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 Repeat prognostic panel showed tumor to be ER +93% and PR +51%. HER-2/neu was negative with a ratio of 0.71, with proliferation marker of 36%.  Patient was evaluated by Dr. Darnelle Catalan in January 2011 but failed to keep appointments until she was seen here again in July 2011. In July, she began adjuvant chemotherapy. She received 4 cycles of dose dense  doxorubicin and cyclophosphamide followed by 4 weekly doses of paclitaxel which was discontinued in November 2011 secondary to peripheral neuropathy. The patient then received radiation therapy to the right chest wall which was truncated due to problems with missing appointments.  Patient was started on tamoxifen briefly in June of 2012 which was discontinued due to nausea. The nausea continued, and an abdominal CT in October 2012 confirmed a growth in liver lesions. These were biopsied on 03/10/2011 and showed metastatic adenocarcinoma, still ER positive at 100%, PR +5%. Her subsequent treatments are as detailed below  INTERVAL HISTORY: [name] returns today  for followup of her metastatic breast carcinoma. Interval history is remarkable for [name] having undergone a right hip arthroplasty under the care of Dr. Charlann Boxer on 05/04/2012. She tolerated the procedure well with no complications. She is recovering well, and although she still has some pain in the right hip and right leg, the pain has improved, and she is able to ambulate a little easier with her walker.   [name] was very tearful and anxious on presentation today. She is concerned about her living situation. She's currently in a 2 story apartment which causes increased pain and makes her activities of daily living extremely difficult. Of course she is a very high risk for falls as well. She is hoping to get moved to a one story apartment soon. She has not been able to see her daughter recently since she is [place] "[place] town" and does not have access to a car. This in itself is increasing [name]'s depression. We discussed antidepressants but she tells me she "just needs her daughter".  REVIEW OF SYSTEMS:  [name] is still weak and is very tired today. She has shortness of breath with exertion. She denies any cough since had no chest pain. She's had no fevers or chills. She's had no significant hot flashes. She has occasional nausea but no emesis.  She's having regular bowel movements with the use of glycerin suppositories. She's had no signs of abnormal bleeding. She denies any rashes or skin changes. The surgical dressing is still intact in the right hip. She denies any recent headaches, dizziness, or change in vision. She continues to have some swelling in the right lower extremity which is a chronic issue. This decreases with elevation. She has not yet made an appointment to see her dentist, which is keeping her from doing her zoledronic acid.   A detailed review of systems was otherwise stable and noncontributory.   PAST MEDICAL HISTORY: Past Medical History  Diagnosis Date  . Hypertension   . GERD (gastroesophageal reflux disease)   . 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 Pyelonephritis 10/01/11  . Recurrent UTI (urinary tract infection) 10/01/11  . S/P chemotherapy, time since 4-12 weeks   . S/P radiation > 12 weeks   . 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 Brain cancer     spread from breast ca    PAST SURGICAL HISTORY: Past Surgical History  Procedure Date  . Cesarean section 1989; 2010  . Mastectomy 2011    right  . Back surgery 2012    "removed tumor,cancer, from my spine  . Port-a-cath removal 2012    right chest  . Portacath placement 2011    right  . Tubal ligation 2010  . Breast biopsy 2011    right  . Hip arthroplasty 05/04/2012    Procedure: ARTHROPLASTY BIPOLAR HIP;  Surgeon: Shelda Pal, MD;  Location: [facility];  Service: Orthopedics;  Laterality: Right;  RIGHT HIP HEMI ARTHROPLASTY     FAMILY HISTORY The patient's father is alive at age 68.  The patient's mother is alive at age 72.  She has four sisters.  She had one brother who died from colon cancer at the age of 65.  There is no breast or ovarian cancer history in the family to her knowledge.  GYNECOLOGIC HISTORY: She is GX P2.  First pregnancy to term at age 32.    SOCIAL HISTORY: The patient is not  employed.  She lives by herself with her small son whose name is [name] (he is currently staying with his dad in [place]).  Her first child, [name], is studying business in this area.  The patient has one grandchild.  She attends the [employer].    ADVANCED DIRECTIVES: not in place  HEALTH MAINTENANCE: History  Substance Use Topics  . Smoking status: Former Smoker -- 0.2 packs/day for 31 years    Types: Cigarettes    Quit date: 04/26/2012  . Smokeless tobacco: Never Used  . Alcohol Use: No     Colonoscopy:  PAP:  Bone density:  Lipid panel:  No Known Allergies  Current Outpatient Prescriptions  Medication Sig Dispense Refill  . amLODipine (NORVASC) 5 MG tablet Take 5 mg by mouth daily before breakfast.      . dexamethasone (DECADRON) 4 MG tablet Take 2 mg by mouth daily before breakfast.      . diphenhydrAMINE (BENADRYL) 25 mg capsule Take 1 capsule (25 mg total) by mouth every 6 (six) hours as  needed for itching, allergies or sleep.  30 capsule    . docusate sodium 100 MG CAPS Take 100 mg by mouth 2 (two) times daily.  10 capsule    . enoxaparin (LOVENOX) 40 MG/0.4ML injection Inject 0.4 mLs (40 mg total) into the skin daily.  12 Syringe  0  . everolimus (AFINITOR) 10 MG tablet Take 1 tablet (10 mg total) by mouth daily.  30 tablet  6  . exemestane (AROMASIN) 25 MG tablet Take 1 tablet (25 mg total) by mouth daily after breakfast.  30 tablet  12  . ferrous sulfate 325 (65 FE) MG tablet Take 1 tablet (325 mg total) by mouth 3 (three) times daily after meals.      . methocarbamol (ROBAXIN) 500 MG tablet Take 1 tablet (500 mg total) by mouth every 6 (six) hours as needed (muscle spasms).  50 tablet  0  . Multiple Vitamin (MULTIVITAMIN WITH MINERALS) TABS Take 1 tablet by mouth daily.      [name] omeprazole (PRILOSEC) 20 MG capsule Take 1 capsule (20 mg total) by mouth daily.  30 capsule  6  . oxyCODONE 10 MG TABS Take 1 tablet (10 mg total) by mouth every 4 (four) hours as needed  for pain.  120 tablet  0  . oxyCODONE-acetaminophen (PERCOCET/ROXICET) 5-325 MG per tablet Take 1 tablet by mouth every 8 (eight) hours as needed for pain. May take with oxycodone 10mg  . No more then 4 percocet a day - if more needed in a day call MD.  60 tablet  0  . polyethylene glycol (MIRALAX / GLYCOLAX) packet Take 17 g by mouth 2 (two) times daily.  14 each    . promethazine (PHENERGAN) 25 MG tablet Take 0.5-1 tablets (12.5-25 mg total) by mouth every 6 (six) hours as needed. Nausea  30 tablet  1  . [DISCONTINUED] Alum & Mag Hydroxide-Simeth (MAGIC MOUTHWASH W/LIDOCAINE) SOLN Take 5 mLs by mouth 3 (three) times daily as needed (take 15 minutes prior to meals.).  480 mL  0    OBJECTIVE: Middle-aged [place] American woman with significant pain on weightbearing, appearing tired and anxious There were no vitals filed for this visit.   There is no height or weight on file to calculate BMI.    ECOG FS:2   There were no vitals filed for this visit. Sclerae unicteric Oropharynx clear No cervical or supraclavicular adenopathy Lungs no rales or rhonchi Heart regular rate and rhythm Abdomen nontender, positive bowel sounds MSK  exam limited by pain, limited but improved range of motion in the left lower extremity secondary to pain Neuro: nonfocal, alert and oriented x3 Breasts: Deferred   LAB RESULTS: Lab Results  Component Value Date   WBC 5.8 05/23/2012   NEUTROABS 4.4 05/23/2012   HGB 11.6 05/23/2012   HCT 33.4* 05/23/2012   MCV 87.1 05/23/2012   PLT 452* 05/23/2012      Chemistry      Component Value Date/Time   NA 138 05/13/2012 1351   NA 138 05/06/2012 0415   K 3.9 05/13/2012 1351   K 3.4* 05/06/2012 0415   CL 100 05/13/2012 1351   CL 104 05/06/2012 0415   CO2 27 05/13/2012 1351   CO2 27 05/06/2012 0415   BUN 9.0 05/13/2012 1351   BUN 10 05/06/2012 0415   CREATININE 0.7 05/13/2012 1351   CREATININE 0.68 05/06/2012 0415      Component Value Date/Time   CALCIUM 9.3  05/13/2012 1351   CALCIUM 8.9  05/06/2012 0415   ALKPHOS 364* 05/13/2012 1351   ALKPHOS 259* 05/03/2012 1100   AST 39* 05/13/2012 1351   AST 37 05/03/2012 1100   ALT 25 05/13/2012 1351   ALT 31 05/03/2012 1100   BILITOT 0.67 05/13/2012 1351   BILITOT 0.2* 05/03/2012 1100       Lab Results  Component Value Date   LABCA2 62* 05/13/2012    STUDIES:  05/04/2012 *RADIOLOGY REPORT*  Clinical Data: Status post right hip arthroplasty.  PORTABLE PELVIS  Comparison: None.  Findings: Right hip bipolar hemiarthroplasty shows normal  alignment. No postoperative complications are evident.  IMPRESSION:  Normal alignment of right hip arthroplasty.  Original Report Authenticated By: Irish Lack, M.D.     Ct Chest W Contrast  04/20/2012  *RADIOLOGY REPORT*  Clinical Data:  Breast cancer with liver and bone metastasis.  CT CHEST, ABDOMEN AND PELVIS WITH CONTRAST  Technique:  Multidetector CT imaging of the chest, abdomen and pelvis was performed following the standard protocol during bolus administration of intravenous contrast.  Contrast: OMNIPAQUE IOHEXOL 300 MG/ML  SOLN  Comparison:  10/01/2011.  CT CHEST  Findings:  Examination of the chest wall demonstrates a right mastectomy defect.  A right lymph node dissection is noted without findings for chest wall tumor or axillary adenopathy.  No enlarged supraclavicular lymph nodes.  The left breast is unremarkable.  Diffuse osseous metastatic disease is noted.  No pathologic fracture identified. Wide decompressive laminectomy is again noted from T9-T11.  The heart is normal in size.  No pericardial effusion.  No mediastinal or hilar lymphadenopathy.  Small scattered lymph nodes are stable.  The esophagus is grossly normal.  The aorta is normal in caliber.  No dissection.  Examination of the lung parenchyma demonstrates mild interstitial thickening but no findings for pulmonary metastatic disease or acute pulmonary abnormality.  IMPRESSION:  1.   Diffuse osseous metastatic disease without pathologic fracture or canal stenosis. 2.  Wide decompressive laminectomies from T9-T11. 3.  No findings for recurrent chest wall disease, axillary adenopathy or pulmonary metastasis.  CT ABDOMEN AND PELVIS  Findings:  Progressive hepatic metastatic disease is demonstrated with diffuse tumor involvement of both hepatic lobes the spleen is normal.  The pancreas is normal.  There are left renal calculi but no obstructing ureteral calculi.  No renal lesions.  The stomach is not well distended with contrast but no gross abnormalities are seen.  The small bowel and colon grossly normal.  A fibroid uterus is again demonstrated with a calcified fibroid in the fundus.  The ovaries are normal.  The bladder is normal.  No pelvic mass, adenopathy or free pelvic fluid collections.  No inguinal mass or hernia.  The aorta is normal in caliber.  The major branch vessels are patent.  No mesenteric or retroperitoneal mass or adenopathy.  Diffuse osseous metastatic disease is again demonstrated. Mild progression overall.  There appears to be an enlarging metastatic lesion in the right hip and a pathologic basicervical neck fracture is suspected.  IMPRESSION:  1.  Marked progression of hepatic metastatic disease. 2.  Progressive metastatic lesion in the right femoral neck and intertrochanteric region with a pathologic nondisplaced fracture through the base of the neck.   Original Report Authenticated By: Rudie Meyer, M.D.    Ct Abdomen Pelvis W Contrast  04/20/2012  *RADIOLOGY REPORT*  Clinical Data:  Breast cancer with liver and bone metastasis.  CT CHEST, ABDOMEN AND PELVIS WITH CONTRAST  Technique:  Multidetector CT imaging of the  chest, abdomen and pelvis was performed following the standard protocol during bolus administration of intravenous contrast.  Contrast: OMNIPAQUE IOHEXOL 300 MG/ML  SOLN  Comparison:  10/01/2011.  CT CHEST  Findings:  Examination of the chest wall  demonstrates a right mastectomy defect.  A right lymph node dissection is noted without findings for chest wall tumor or axillary adenopathy.  No enlarged supraclavicular lymph nodes.  The left breast is unremarkable.  Diffuse osseous metastatic disease is noted.  No pathologic fracture identified. Wide decompressive laminectomy is again noted from T9-T11.  The heart is normal in size.  No pericardial effusion.  No mediastinal or hilar lymphadenopathy.  Small scattered lymph nodes are stable.  The esophagus is grossly normal.  The aorta is normal in caliber.  No dissection.  Examination of the lung parenchyma demonstrates mild interstitial thickening but no findings for pulmonary metastatic disease or acute pulmonary abnormality.  IMPRESSION:  1.  Diffuse osseous metastatic disease without pathologic fracture or canal stenosis. 2.  Wide decompressive laminectomies from T9-T11. 3.  No findings for recurrent chest wall disease, axillary adenopathy or pulmonary metastasis.  CT ABDOMEN AND PELVIS  Findings:  Progressive hepatic metastatic disease is demonstrated with diffuse tumor involvement of both hepatic lobes the spleen is normal.  The pancreas is normal.  There are left renal calculi but no obstructing ureteral calculi.  No renal lesions.  The stomach is not well distended with contrast but no gross abnormalities are seen.  The small bowel and colon grossly normal.  A fibroid uterus is again demonstrated with a calcified fibroid in the fundus.  The ovaries are normal.  The bladder is normal.  No pelvic mass, adenopathy or free pelvic fluid collections.  No inguinal mass or hernia.  The aorta is normal in caliber.  The major branch vessels are patent.  No mesenteric or retroperitoneal mass or adenopathy.  Diffuse osseous metastatic disease is again demonstrated. Mild progression overall.  There appears to be an enlarging metastatic lesion in the right hip and a pathologic basicervical neck fracture is suspected.   IMPRESSION:  1.  Marked progression of hepatic metastatic disease. 2.  Progressive metastatic lesion in the right femoral neck and intertrochanteric region with a pathologic nondisplaced fracture through the base of the neck.   Original Report Authenticated By: Rudie Meyer, M.D.     ASSESSMENT:   46 y.o.  [place] woman    1. 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    2.Status post adjuvant chemotherapy July to November 2011, consisting of 4 cycles of dose dense doxorubicin and cyclophosphamide, followed by 4 of 12 planned weekly doses of paclitaxel given adjuvantly, discontinued secondary to peripheral neuropathy.    3. Status post radiation to the right chest wall, truncated due to problems with the patient missing appointments.   4. Tamoxifen started June of 2012, but discontinued due to nausea.    5. metastatic disease to liver and bone pathologically documented October of 2012, estrogen receptor 100% positive, progesterone receptor 7% positive, with no HER-2 amplification;  treatment in the metastatic setting has consisted of:   6.Faslodex started 03/16/2011, discontinued November 2013 with progression. Zoladex every 3 months also started on 03/16/2011, and monthly zoledronic acid started November 2011, the Zometa I currently held due to pending dental procedures and possible extractions.  7. Status post thoracic laminectomy, T9-T11, for impending cord compression on 03/21/2011, followed by irradiation to the spine completed November 2011  8. 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 s/p palliative radiation to the Right hip clivus to 30 Gy completed 04/07/2012  10.   Progression noted by scans in November 2013, at which time Faslodex was discontinued. Patient was started on exemestane in early December, the plan being to add everolimus in mid December. Continues to receive Zoladex injections every 3 months.  11. Nondisplaced right femoral neck fracture, status post right hip arthroplasty on 05/04/2012 under the care of Dr.Matt [facility].       PLAN:  [name] is recovering well from her right hip arthroplasty, but nonetheless, continues to have pain as well as multiple complaints and concerns. Over half of our 1 hour appointment today was spent counseling the patient regarding these issues and discussing possible solutions/treatment.  She has been using oxycodone/APAP for pain, and needs a refill on her pain medication. We are going to try to drop the Tylenol from the equation due to the liver mets, and see if oxycodone 10 mg, 1 every 4 hours, will control [name]'s pain adequately. She was given a prescription accordingly today.  She will continue on the exemestane which she seems to be tolerating well. We hope that her everolimus will arrive from the pharmacy by early next week, and she will start that medication as soon she gets it.  She's also due for her next Zoladex injection on December 17, and we will go ahead and give that to her today.  [name] will return tomorrow for transfusion of 2 units packed red blood cells for symptomatic anemia and a hemoglobin of 7.7 today. We will plan on seeing her back on December 23 for repeat labs and followup visit, also to assess tolerance of the everolimus at that point.   [name] is currently residing in a 2 story apartment, and I have written a letter to her landlord today requesting a move to a one story apartment due to her compromised ability to ambulate and her high risk of falls. We are also requesting a home health consult for physical therapy, occupational therapy, surgical wound management, emotional support/social  work consult, and medication management.   All this was reviewed in detail with Akiah today, and she was given all of this information in writing. She voiced her understanding of the above, and will call with any changes or problems.  MAGRINAT,GUSTAV C    05/23/2012

## 2012-05-23 NOTE — H&P (Signed)
ID: [name]   DOB: 02-25-1966  MR#: [id number]  HYQ#:[id number]  HISTORY OF PRESENT ILLNESS: The patient delivered a child on 05/17/2009, after which she felt fullness in the right breast. She eventually brought this to her doctors attention and was scheduled for mammography and ultrasound on 03/08/2009. There were no prior exams for comparison. Mammography showed the breast to be dense, with a 5 cm obscured mass in the outer midportion of the right breast. Mass was easily palpable, and there was also a mass in the outer right periareolar region. They were suspicious microcalcifications in the outer midportion of the right breast, as well as within the mass itself. Ultrasound showed an extensive heterogeneous area, corresponding to the palpable abnormality with multiple solid and cystic foci. Overall, the mass measured in excess of 5 cm, in addition to cysts in the breast.  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 The tumors were ER +100%, PR +80%.  Bilateral breast MRI was obtained 03/22/2009. There was a large area of abnormal enhancement in the lower outer quadrant of the right breast measuring up to 9.7 cm. No abnormal appearing lymph nodes and no other masses in either breast.  Patient underwent definitive right mastectomy and sentinel lymph node sampling on 04/29/2009 under the care of Dr. Derrell Lolling. 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 Repeat prognostic panel showed tumor to be ER +93% and PR +51%. HER-2/neu was negative with a ratio of 0.71, with proliferation marker of 36%.  Patient was evaluated by Dr. Darnelle Catalan in January 2011 but failed to keep appointments until she was seen here again in July 2011. In July, she began adjuvant chemotherapy. She received 4 cycles of dose dense  doxorubicin and cyclophosphamide followed by 4 weekly doses of paclitaxel which was discontinued in November 2011 secondary to peripheral neuropathy. The patient then received radiation therapy to the right chest wall which was truncated due to problems with missing appointments.  Patient was started on tamoxifen briefly in June of 2012 which was discontinued due to nausea. The nausea continued, and an abdominal CT in October 2012 confirmed a growth in liver lesions. These were biopsied on 03/10/2011 and showed metastatic adenocarcinoma, still ER positive at 100%, PR +5%. Her subsequent treatments are as detailed below  INTERVAL HISTORY: [name] was seen in clinic today for routine followup. She was crying uncontrollably because of pain. She has not been able to get her pain medication filled, because she was told "it isn't time". She does have home health and they have been coming, although she has not yet started her rehabilitation. We gave the patient 2 Percocet with slight improvement in her pain she is being admitted for pain control.  REVIEW OF SYSTEMS: Aside from the pain, and the obvious distress this is causing her, the patient denies unusual headaches, visual changes, nausea, or vomiting. She denies constipation from her narcotics. There've been no cough or phlegm production. Her walking is a bit better but she still limping significantly. She uses a cane. There has been no change in bladder habits. Has been no rash fever or bleeding. A detailed review of systems was otherwise stable.   PAST MEDICAL HISTORY: Past Medical History  Diagnosis Date  . Hypertension   . GERD (gastroesophageal reflux disease)   . 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  Pyelonephritis 10/01/11  . Recurrent UTI (urinary tract infection) 10/01/11  . S/P chemotherapy, time since 4-12 weeks   . S/P radiation > 12 weeks   . 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 Brain cancer      spread from breast ca    PAST SURGICAL HISTORY: Past Surgical History  Procedure Date  . Cesarean section 1989; 2010  . Mastectomy 2011    right  . Back surgery 2012    "removed tumor,cancer, from my spine  . Port-a-cath removal 2012    right chest  . Portacath placement 2011    right  . Tubal ligation 2010  . Breast biopsy 2011    right  . Hip arthroplasty 05/04/2012    Procedure: ARTHROPLASTY BIPOLAR HIP;  Surgeon: Shelda Pal, MD;  Location: [facility];  Service: Orthopedics;  Laterality: Right;  RIGHT HIP HEMI ARTHROPLASTY     FAMILY HISTORY The patient's father is alive at age 47.  The patient's mother is alive at age 80.  She has four sisters.  She had one brother who died from colon cancer at the age of 41.  There is no breast or ovarian cancer history in the family to her knowledge.  GYNECOLOGIC HISTORY: She is GX P2.  First pregnancy to term at age 39.    SOCIAL HISTORY: The patient is not employed.  She lives by herself. Her small son whose name is [name] is currently staying with his dad in [place].  Her first child, [name], is studying business in this area.  The patient has one grandchild.  She attends the [employer].    ADVANCED DIRECTIVES: not in place  HEALTH MAINTENANCE: History  Substance Use Topics  . Smoking status: Former Smoker -- 0.2 packs/day for 31 years    Types: Cigarettes    Quit date: 04/26/2012  . Smokeless tobacco: Never Used  . Alcohol Use: No     Colonoscopy:  PAP:  Bone density:  Lipid panel:  No Known Allergies  Current Outpatient Prescriptions  Medication Sig Dispense Refill  . amLODipine (NORVASC) 5 MG tablet Take 5 mg by mouth daily before breakfast.      . dexamethasone (DECADRON) 4 MG tablet Take 2 mg by mouth daily before breakfast.      . diphenhydrAMINE (BENADRYL) 25 mg capsule Take 1 capsule (25 mg total) by mouth every 6 (six) hours as needed for itching, allergies or sleep.  30 capsule    . docusate  sodium 100 MG CAPS Take 100 mg by mouth 2 (two) times daily.  10 capsule    . enoxaparin (LOVENOX) 40 MG/0.4ML injection Inject 0.4 mLs (40 mg total) into the skin daily.  12 Syringe  0  . everolimus (AFINITOR) 10 MG tablet Take 1 tablet (10 mg total) by mouth daily.  30 tablet  6  . exemestane (AROMASIN) 25 MG tablet Take 1 tablet (25 mg total) by mouth daily after breakfast.  30 tablet  12  . ferrous sulfate 325 (65 FE) MG tablet Take 1 tablet (325 mg total) by mouth 3 (three) times daily after meals.      . methocarbamol (ROBAXIN) 500 MG tablet Take 1 tablet (500 mg total) by mouth every 6 (six) hours as needed (muscle spasms).  50 tablet  0  . Multiple Vitamin (MULTIVITAMIN WITH MINERALS) TABS Take 1 tablet by mouth daily.      [name] omeprazole (PRILOSEC) 20 MG capsule Take  1 capsule (20 mg total) by mouth daily.  30 capsule  6  . oxyCODONE 10 MG TABS Take 1 tablet (10 mg total) by mouth every 4 (four) hours as needed for pain.  120 tablet  0  . oxyCODONE-acetaminophen (PERCOCET/ROXICET) 5-325 MG per tablet Take 1 tablet by mouth every 8 (eight) hours as needed for pain. May take with oxycodone 10mg  . No more then 4 percocet a day - if more needed in a day call MD.  60 tablet  0  . polyethylene glycol (MIRALAX / GLYCOLAX) packet Take 17 g by mouth 2 (two) times daily.  14 each    . promethazine (PHENERGAN) 25 MG tablet Take 0.5-1 tablets (12.5-25 mg total) by mouth every 6 (six) hours as needed. Nausea  30 tablet  1  . [DISCONTINUED] Alum & Mag Hydroxide-Simeth (MAGIC MOUTHWASH W/LIDOCAINE) SOLN Take 5 mLs by mouth 3 (three) times daily as needed (take 15 minutes prior to meals.).  480 mL  0    OBJECTIVE: Middle-aged [place] American woman with uncontrolled pain, examined supine   There were no vitals filed for this visit. (pending)  Sclerae unicteric Oropharynx clear, slightly dry No cervical or supraclavicular adenopathy Lungs no rales or rhonchi Heart regular rate and rhythm Abdomen  nontender, positive bowel sounds MSK exam limited by pain Neuro: nonfocal, alert and oriented x3 Breasts: Deferred   LAB RESULTS: Lab Results  Component Value Date   WBC 5.8 05/23/2012   NEUTROABS 4.4 05/23/2012   HGB 11.6 05/23/2012   HCT 33.4* 05/23/2012   MCV 87.1 05/23/2012   PLT 452* 05/23/2012      Chemistry      Component Value Date/Time   NA 138 05/13/2012 1351   NA 138 05/06/2012 0415   K 3.9 05/13/2012 1351   K 3.4* 05/06/2012 0415   CL 100 05/13/2012 1351   CL 104 05/06/2012 0415   CO2 27 05/13/2012 1351   CO2 27 05/06/2012 0415   BUN 9.0 05/13/2012 1351   BUN 10 05/06/2012 0415   CREATININE 0.7 05/13/2012 1351   CREATININE 0.68 05/06/2012 0415      Component Value Date/Time   CALCIUM 9.3 05/13/2012 1351   CALCIUM 8.9 05/06/2012 0415   ALKPHOS 364* 05/13/2012 1351   ALKPHOS 259* 05/03/2012 1100   AST 39* 05/13/2012 1351   AST 37 05/03/2012 1100   ALT 25 05/13/2012 1351   ALT 31 05/03/2012 1100   BILITOT 0.67 05/13/2012 1351   BILITOT 0.2* 05/03/2012 1100       Lab Results  Component Value Date   LABCA2 62* 05/13/2012    STUDIES:  05/04/2012 *RADIOLOGY REPORT*  Clinical Data: Status post right hip arthroplasty.  PORTABLE PELVIS  Comparison: None.  Findings: Right hip bipolar hemiarthroplasty shows normal  alignment. No postoperative complications are evident.  IMPRESSION:  Normal alignment of right hip arthroplasty.  Original Report Authenticated By: Irish Lack, M.D.     Ct Chest W Contrast  04/20/2012  *RADIOLOGY REPORT*  Clinical Data:  Breast cancer with liver and bone metastasis.  CT CHEST, ABDOMEN AND PELVIS WITH CONTRAST  Technique:  Multidetector CT imaging of the chest, abdomen and pelvis was performed following the standard protocol during bolus administration of intravenous contrast.  Contrast: OMNIPAQUE IOHEXOL 300 MG/ML  SOLN  Comparison:  10/01/2011.  CT CHEST  Findings:  Examination of the chest wall demonstrates a right  mastectomy defect.  A right lymph node dissection is noted without findings for chest wall tumor  or axillary adenopathy.  No enlarged supraclavicular lymph nodes.  The left breast is unremarkable.  Diffuse osseous metastatic disease is noted.  No pathologic fracture identified. Wide decompressive laminectomy is again noted from T9-T11.  The heart is normal in size.  No pericardial effusion.  No mediastinal or hilar lymphadenopathy.  Small scattered lymph nodes are stable.  The esophagus is grossly normal.  The aorta is normal in caliber.  No dissection.  Examination of the lung parenchyma demonstrates mild interstitial thickening but no findings for pulmonary metastatic disease or acute pulmonary abnormality.  IMPRESSION:  1.  Diffuse osseous metastatic disease without pathologic fracture or canal stenosis. 2.  Wide decompressive laminectomies from T9-T11. 3.  No findings for recurrent chest wall disease, axillary adenopathy or pulmonary metastasis.  CT ABDOMEN AND PELVIS  Findings:  Progressive hepatic metastatic disease is demonstrated with diffuse tumor involvement of both hepatic lobes the spleen is normal.  The pancreas is normal.  There are left renal calculi but no obstructing ureteral calculi.  No renal lesions.  The stomach is not well distended with contrast but no gross abnormalities are seen.  The small bowel and colon grossly normal.  A fibroid uterus is again demonstrated with a calcified fibroid in the fundus.  The ovaries are normal.  The bladder is normal.  No pelvic mass, adenopathy or free pelvic fluid collections.  No inguinal mass or hernia.  The aorta is normal in caliber.  The major branch vessels are patent.  No mesenteric or retroperitoneal mass or adenopathy.  Diffuse osseous metastatic disease is again demonstrated. Mild progression overall.  There appears to be an enlarging metastatic lesion in the right hip and a pathologic basicervical neck fracture is suspected.  IMPRESSION:  1.   Marked progression of hepatic metastatic disease. 2.  Progressive metastatic lesion in the right femoral neck and intertrochanteric region with a pathologic nondisplaced fracture through the base of the neck.   Original Report Authenticated By: Rudie Meyer, M.D.    Ct Abdomen Pelvis W Contrast  04/20/2012  *RADIOLOGY REPORT*  Clinical Data:  Breast cancer with liver and bone metastasis.  CT CHEST, ABDOMEN AND PELVIS WITH CONTRAST  Technique:  Multidetector CT imaging of the chest, abdomen and pelvis was performed following the standard protocol during bolus administration of intravenous contrast.  Contrast: OMNIPAQUE IOHEXOL 300 MG/ML  SOLN  Comparison:  10/01/2011.  CT CHEST  Findings:  Examination of the chest wall demonstrates a right mastectomy defect.  A right lymph node dissection is noted without findings for chest wall tumor or axillary adenopathy.  No enlarged supraclavicular lymph nodes.  The left breast is unremarkable.  Diffuse osseous metastatic disease is noted.  No pathologic fracture identified. Wide decompressive laminectomy is again noted from T9-T11.  The heart is normal in size.  No pericardial effusion.  No mediastinal or hilar lymphadenopathy.  Small scattered lymph nodes are stable.  The esophagus is grossly normal.  The aorta is normal in caliber.  No dissection.  Examination of the lung parenchyma demonstrates mild interstitial thickening but no findings for pulmonary metastatic disease or acute pulmonary abnormality.  IMPRESSION:  1.  Diffuse osseous metastatic disease without pathologic fracture or canal stenosis. 2.  Wide decompressive laminectomies from T9-T11. 3.  No findings for recurrent chest wall disease, axillary adenopathy or pulmonary metastasis.  CT ABDOMEN AND PELVIS  Findings:  Progressive hepatic metastatic disease is demonstrated with diffuse tumor involvement of both hepatic lobes the spleen is normal.  The pancreas  is normal.  There are left renal calculi but no  obstructing ureteral calculi.  No renal lesions.  The stomach is not well distended with contrast but no gross abnormalities are seen.  The small bowel and colon grossly normal.  A fibroid uterus is again demonstrated with a calcified fibroid in the fundus.  The ovaries are normal.  The bladder is normal.  No pelvic mass, adenopathy or free pelvic fluid collections.  No inguinal mass or hernia.  The aorta is normal in caliber.  The major branch vessels are patent.  No mesenteric or retroperitoneal mass or adenopathy.  Diffuse osseous metastatic disease is again demonstrated. Mild progression overall.  There appears to be an enlarging metastatic lesion in the right hip and a pathologic basicervical neck fracture is suspected.  IMPRESSION:  1.  Marked progression of hepatic metastatic disease. 2.  Progressive metastatic lesion in the right femoral neck and intertrochanteric region with a pathologic nondisplaced fracture through the base of the neck.   Original Report Authenticated By: Rudie Meyer, M.D.     ASSESSMENT:   46 y.o.  [place] woman    1. 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    2.Status post adjuvant chemotherapy July to November 2011, consisting of 4 cycles of dose dense doxorubicin and cyclophosphamide, followed by 4 of 12 planned weekly doses of paclitaxel given adjuvantly, discontinued secondary to peripheral neuropathy.    3. Status post radiation to the right chest wall, truncated due to problems with the patient missing appointments.   4. Tamoxifen started June of 2012, but discontinued due to nausea.    5. metastatic disease to liver and bone pathologically documented October of 2012, estrogen receptor 100% positive, progesterone receptor 7% positive, with no HER-2 amplification;  treatment  in the metastatic setting has consisted of:   6.Faslodex started 03/16/2011, discontinued November 2013 with progression. Zoladex every 3 months also started on 03/16/2011, and monthly zoledronic acid started November 2011, the Zometa I currently held due to pending dental procedures and possible extractions.             7. Status post thoracic laminectomy, T9-T11, for impending cord compression on 03/21/2011, followed by irradiation to the spine completed November 2011  8. 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 s/p palliative radiation to the Right hip clivus to 30 Gy completed 04/07/2012  10.  Progression noted by scans in November 2013, at which time Faslodex was discontinued. Patient was started on exemestane in early December, the plan being to add everolimus in mid December. Continues to receive Zoladex injections every 3 months.  11. Nondisplaced right femoral neck fracture, status post right hip arthroplasty on 05/04/2012 under the care of Dr.Matt [facility].       PLAN:  Giulianna started the exemestane and everolimus  Mid December. She is tolerating it well, although she feels perhaps her palms are a little bit rougher. She has not had mucositis or pneumonitis that she is aware of. She is hurting note only in her leg but in other areas as well, and of course she has significant metastatic disease to bone. I am going to start her on methadone for pain control, and this was discussed with her in a preliminary fashion. I  do not believe we could titrate this easily at home, and we are admitting her for pain control and for methadone titration. I will obtain a repeat bone scan to obtain a new baseline as far as her bony disease is concerned. She may benefit from pamidronate and we will take the opportunity of this admission to give her that medication as well. At this point the  patient remains full code

## 2012-05-23 NOTE — Progress Notes (Signed)
Pt needs IV, staff nursing and IV team have been unsuccessful. Called anesthesiology. Will continue to monitor.

## 2012-05-24 LAB — CBC
HCT: 32.2 % — ABNORMAL LOW (ref 36.0–46.0)
MCV: 87.5 fL (ref 78.0–100.0)
Platelets: 376 10*3/uL (ref 150–400)
RBC: 3.68 MIL/uL — ABNORMAL LOW (ref 3.87–5.11)
WBC: 3.6 10*3/uL — ABNORMAL LOW (ref 4.0–10.5)

## 2012-05-24 LAB — COMPREHENSIVE METABOLIC PANEL
Albumin: 2.5 g/dL — ABNORMAL LOW (ref 3.5–5.2)
BUN: 8 mg/dL (ref 6–23)
Creatinine, Ser: 0.6 mg/dL (ref 0.50–1.10)
GFR calc Af Amer: >90 mL/min (ref >90)
Glucose, Bld: 100 mg/dL — ABNORMAL HIGH (ref 70–99)
Total Protein: 6.7 g/dL (ref 6.0–8.3)

## 2012-05-24 LAB — URINE CULTURE: Colony Count: 40000

## 2012-05-24 MED ORDER — SORBITOL 70 % SOLN
30.0000 mL | Freq: Once | Status: AC
  Administered 2012-05-24: 30 mL via ORAL
  Filled 2012-05-24: qty 30

## 2012-05-24 MED ORDER — METHADONE HCL 10 MG PO TABS
10.0000 mg | ORAL_TABLET | Freq: Three times a day (TID) | ORAL | Status: DC
  Administered 2012-05-24 – 2012-05-25 (×4): 10 mg via ORAL
  Filled 2012-05-24 (×3): qty 1

## 2012-05-24 MED ORDER — POTASSIUM CHLORIDE CRYS ER 20 MEQ PO TBCR
20.0000 meq | EXTENDED_RELEASE_TABLET | Freq: Every day | ORAL | Status: DC
  Administered 2012-05-24 – 2012-05-25 (×2): 20 meq via ORAL
  Filled 2012-05-24 (×2): qty 1

## 2012-05-24 MED ORDER — DOCUSATE SODIUM 100 MG PO CAPS
200.0000 mg | ORAL_CAPSULE | Freq: Two times a day (BID) | ORAL | Status: DC
  Administered 2012-05-24 – 2012-05-25 (×3): 200 mg via ORAL
  Filled 2012-05-24 (×4): qty 2

## 2012-05-24 NOTE — Progress Notes (Signed)
[name]   DOB:12-Aug-1965   YQ#:[id number]   XBM#:[id number]  Subjective: "I'm better." Does not feel methadone is doing much; also does not feel oxycodone does much; the only thing that works well she feels is percocet. No BM in 2 days; 47 y/o son sitting on bed. Patient was tearful when discharge discussed--thinks it would be best for her to stay in hosp through holidays   Objective: middle aged [place] American woman examined sitting up in recliner Filed Vitals:   05/24/12 0500  BP: 126/69  Pulse: 91  Temp: 98.2 F (36.8 C)  Resp: 19    Body mass index is 25.24 kg/(m^2).  Intake/Output Summary (Last 24 hours) at 05/24/12 0827 Last data filed at 05/24/12 0600  Gross per 24 hour  Intake    960 ml  Output    100 ml  Net    860 ml     Sclerae unicteric  Oropharynx clear  No peripheral adenopathy  Lungs clear -- no rales or rhonchi  Heart regular rate and rhythm  Abdomen benign  MSK no focal spinal tenderness, no peripheral edema; pain in R LE limits exam  Neuro nonfocal  Breast exam: deferred  CBG (last 3)  No results found for this basename: GLUCAP:3 in the last 72 hours   Labs:  Lab Results  Component Value Date   WBC 5.4 05/23/2012   HGB 10.5* 05/23/2012   HCT 32.0* 05/23/2012   MCV 86.5 05/23/2012   PLT 455* 05/23/2012   NEUTROABS 4.4 05/23/2012    @LASTCHEMISTRY @  Urine Studies No results found for this basename: UACOL:2,UAPR:2,USPG:2,UPH:2,UTP:2,UGL:2,UKET:2,UBIL:2,UHGB:2,UNIT:2,UROB:2,ULEU:2,UEPI:2,UWBC:2,URBC:2,UBAC:2,CAST:2,CRYS:2,UCOM:2,BILUA:2 in the last 72 hours  Basic Metabolic Panel:  Lab 05/24/12 0272 05/23/12 1656 05/23/12 1407  NA 138 -- 139  K 3.1* -- 3.5  CL 104 -- 101  CO2 24 -- 26  GLUCOSE 100* -- 108*  BUN 8 -- 6.0*  CREATININE 0.60 0.63 0.7  CALCIUM 8.7 -- 9.4  MG -- -- --  PHOS -- -- --   GFR Estimated Creatinine Clearance: 75.6 ml/min (by C-G formula based on Cr of 0.6). Liver Function Tests:  Lab 05/24/12 0343  05/23/12 1407  AST 42* 40*  ALT 13 15  ALKPHOS 296* 333*  BILITOT 0.3 0.48  PROT 6.7 7.6  ALBUMIN 2.5* 2.8*   No results found for this basename: LIPASE:5,AMYLASE:5 in the last 168 hours No results found for this basename: AMMONIA:5 in the last 168 hours Coagulation profile No results found for this basename: INR:5,PROTIME:5 in the last 168 hours  CBC:  Lab 05/23/12 1656 05/23/12 1407  WBC 5.4 5.8  NEUTROABS -- 4.4  HGB 10.5* 11.6  HCT 32.0* 33.4*  MCV 86.5 87.1  PLT 455* 452*   Cardiac Enzymes: No results found for this basename: CKTOTAL:5,CKMB:5,CKMBINDEX:5,TROPONINI:5 in the last 168 hours BNP: No components found with this basename: POCBNP:5 CBG: No results found for this basename: GLUCAP:5 in the last 168 hours D-Dimer No results found for this basename: DDIMER:2 in the last 72 hours Hgb A1c No results found for this basename: HGBA1C:2 in the last 72 hours Lipid Profile No results found for this basename: CHOL:2,HDL:2,LDLCALC:2,TRIG:2,CHOLHDL:2,LDLDIRECT:2 in the last 72 hours Thyroid function studies No results found for this basename: TSH,T4TOTAL,FREET3,T3FREE,THYROIDAB in the last 72 hours Anemia work up No results found for this basename: VITAMINB12:2,FOLATE:2,FERRITIN:2,TIBC:2,IRON:2,RETICCTPCT:2 in the last 72 hours Microbiology No results found for this or any previous visit (from the past 240 hour(s)).    Studies:  No results found.  Assessment: 46 y.o.  [place] woman admitted 05/23/2012 with uncontrolled pain and poor social situation 1. 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  2.Status post adjuvant chemotherapy July to November 2011, consisting of 4 cycles of dose dense doxorubicin and cyclophosphamide, followed by 4 of 12 planned weekly doses of paclitaxel given  adjuvantly, discontinued secondary to peripheral neuropathy symptoms.  3. Status post radiation to the right chest wall, truncated due to problems with the patient missing appointments.  4. Tamoxifen started June of 2012, but discontinued due to nausea.  5. metastatic disease to liver and bone pathologically documented October of 2012, estrogen receptor 100% positive, progesterone receptor 7% positive, with no HER-2 amplification; treatment in the metastatic setting has consisted of:  6.Faslodex started 03/16/2011, discontinued November 2013 with progression.  7.Zoladex every 3 months also started on 03/16/2011, most recent dose 05/13/2012  8.zoledronic acid started November 2011, temporarily held due to pending dental evaluation; received pamidronate 05/23/2012  9. Status post thoracic laminectomy, T9-T11, for impending cord compression on 03/21/2011, followed by irradiation to the spine completed November 2011  10. 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 s/p palliative radiation to the Right hip and clivus to 30 Gy completed 04/07/2012  12. Progression noted by scans in November 2013, at which time Faslodex was discontinued. Patient was started on exemestane in early December, with everolimus added in mid December 2013  13. Nondisplaced right femoral neck fracture, status post right hip arthroplasty on 05/04/2012 under the care of Dr.Matt [facility].   Plan: repeat bone scan is pending; it will give [place] a new baseline as far as her bony pain is concerned. I am upping her methadone to 10 mg TID and likely that will be her discharge dose. She prefers percocet for breakthrough pain and that is written. Will write for PT here and she is already hooked up with PT at home, to be resumed at discharge. [employer] discharge date is 05/27/2012. She has a follow up with [place] Jan 9   [name]  C 05/24/2012

## 2012-05-24 NOTE — Evaluation (Signed)
Physical Therapy Evaluation Patient Details Name: [name] MRN: [id number] DOB: 12-Jan-1966 Today's Date: 05/24/2012 Time: 8295-6213 PT Time Calculation (min): 18 min  PT Assessment / Plan / Recommendation Clinical Impression  Pt presents with hip and back pain with recent D/C earlier in month from bipolar hip arthroplasty from pathologic fracture.  She has history of breast cancer with mets to bone/spine.  Tolerated OOB and ambulation in hallway with SPC at min/guard assist for safety.  Pt will benefit from skilled PT in acute venue to address deficits.  PT recommends HHPT for follow up at D/C.     PT Assessment  Patient needs continued PT services    Follow Up Recommendations  Home health PT    Does the patient have the potential to tolerate intense rehabilitation      Barriers to Discharge None      Equipment Recommendations  None recommended by PT    Recommendations for Other Services     Frequency Min 3X/week    Precautions / Restrictions Precautions Precautions: Posterior Hip Precaution Comments: Pt with bipolar hip earlier this month.  Assume THP  Restrictions Weight Bearing Restrictions: No RLE Weight Bearing: Weight bearing as tolerated   Pertinent Vitals/Pain 5/10      Mobility  Bed Mobility Bed Mobility: Supine to Sit Supine to Sit: 6: Modified independent (Device/Increase time) Transfers Transfers: Sit to Stand;Stand to Sit Sit to Stand: 6: Modified independent (Device/Increase time) Stand to Sit: 6: Modified independent (Device/Increase time) Ambulation/Gait Ambulation/Gait Assistance: 4: Min guard Ambulation Distance (Feet): 200 Feet Assistive device: Straight cane Ambulation/Gait Assistance Details: Min cues for technique with cane.  Lowered cane one notch to better fit pt.   Gait Pattern: Step-through pattern;Antalgic Stairs: No Wheelchair Mobility Wheelchair Mobility: No    Shoulder Instructions     Exercises Other Exercises Other  Exercises: B hip to hip x 10 reps, hip flex x 10 reps, LAQ x 10 reps   PT Diagnosis: Difficulty walking;Generalized weakness;Acute pain  PT Problem List: Decreased strength;Decreased range of motion;Decreased activity tolerance;Decreased balance;Decreased mobility;Decreased knowledge of use of DME;Pain PT Treatment Interventions: DME instruction;Gait training;Stair training;Functional mobility training;Therapeutic activities;Therapeutic exercise;Balance training;Patient/family education   PT Goals Acute Rehab PT Goals PT Goal Formulation: With patient Time For Goal Achievement: 06/07/12 Potential to Achieve Goals: Good Pt will go Supine/Side to Sit: Independently PT Goal: Supine/Side to Sit - Progress: Goal set today Pt will go Sit to Supine/Side: Independently PT Goal: Sit to Supine/Side - Progress: Goal set today Pt will go Sit to Stand: Independently PT Goal: Sit to Stand - Progress: Goal set today Pt will go Stand to Sit: Independently PT Goal: Stand to Sit - Progress: Goal set today Pt will Ambulate: >150 feet;with modified independence;with least restrictive assistive device PT Goal: Ambulate - Progress: Goal set today Pt will Go Up / Down Stairs: 3-5 stairs;with modified independence;with least restrictive assistive device PT Goal: Up/Down Stairs - Progress: Goal set today Pt will Perform Home Exercise Program: Independently PT Goal: Perform Home Exercise Program - Progress: Goal set today  Visit Information  Last PT Received On: 05/24/12 Assistance Needed: +1    Subjective Data  Subjective: I've figured everything out on my own.    Prior Functioning  Home Living Lives With: Son Available Help at Discharge: Friend(s) Type of Home: House Home Access: Stairs to enter [employer] of Steps: 3 Entrance Stairs-Rails: Right Home Layout: One level Bathroom Shower/Tub: Engineer, manufacturing systems: Standard Home Adaptive Equipment: Environmental consultant - rolling;Straight  cane Additional Comments: Pt reports her house has a flight of stairs so she will being going to her son's father's home upon d/c. Prior Function Level of Independence: Independent with assistive device(s) Communication Communication: No difficulties    Cognition  Overall Cognitive Status: Appears within functional limits for tasks assessed/performed Arousal/Alertness: Awake/alert Orientation Level: Appears intact for tasks assessed Behavior During Session: [employer] for tasks performed    Extremity/Trunk Assessment Right Lower Extremity Assessment RLE ROM/Strength/Tone: Deficits RLE ROM/Strength/Tone Deficits: hip flex 2+/5, quads 3/5, ankle motions WFL.  Left Lower Extremity Assessment LLE ROM/Strength/Tone: WFL for tasks assessed   Balance    End of Session PT - End of Session Activity Tolerance: Patient tolerated treatment well Patient left: in bed;with call bell/phone within reach;with family/visitor present Nurse Communication: Mobility status  GP     Page, Meribeth Mattes 05/24/2012, 1:30 PM

## 2012-05-24 NOTE — Progress Notes (Signed)
Nutrition Brief Note  Patient identified on the Malnutrition Screening Tool (MST) Report  Body mass index is 25.24 kg/(m^2). Pt meets criteria for overweight based on current BMI.   Current diet order is regular, patient is consuming approximately 50-100% of meals at this time. Labs and medications reviewed. Pt with metastatic breast CA s/p chemotherapy and radiation. Pt reports eating 3 meals/day PTA with stable weight. Pt reports poor intake over the weekend but states her appetite has picked up today. Pt denies any nausea. Pt eating breakfast.    No nutrition interventions warranted at this time. If nutrition issues arise, please consult RD.   Levon Hedger MS, RD, LDN [phone] Pager [phone] After Hours Pager

## 2012-05-25 ENCOUNTER — Ambulatory Visit: Payer: Medicaid Other

## 2012-05-25 DIAGNOSIS — F329 Major depressive disorder, single episode, unspecified: Secondary | ICD-10-CM

## 2012-05-25 MED ORDER — POTASSIUM CHLORIDE CRYS ER 20 MEQ PO TBCR: 20.0000 meq | EXTENDED_RELEASE_TABLET | Freq: Every day | ORAL | Status: DC

## 2012-05-25 MED ORDER — OXYCODONE-ACETAMINOPHEN 5-325 MG PO TABS: 1.0000 | ORAL_TABLET | Freq: Four times a day (QID) | ORAL | Status: DC | PRN

## 2012-05-25 MED ORDER — METHADONE HCL 10 MG PO TABS: 10.0000 mg | ORAL_TABLET | Freq: Three times a day (TID) | ORAL | Status: DC

## 2012-05-25 NOTE — Progress Notes (Signed)
Subjective: The patient is seen and examined today. She is feeling much better today and her pain is well controlled with the current pain regimen. She denied having any significant fever or chills. She denied having any nausea or vomiting. She is ready for discharge today and would like to spend the rest of the day with her son and her significant other outside the hospital.  Objective: Vital signs in last 24 hours: Temp:  [97.8 F (36.6 C)-98.3 F (36.8 C)] 98.3 F (36.8 C) (12/25 0503) Pulse Rate:  [81-97] 81  (12/25 0503) Resp:  [18] 18  (12/25 0503) BP: (110-126)/(61-70) 126/70 mmHg (12/25 0503) SpO2:  [98 %-100 %] 100 % (12/25 0503)  Intake/Output from previous day: 12/24 0701 - 12/25 0700 In: 1700 [P.O.:1200; I.V.:500] Out: -  Intake/Output this shift:    General appearance: alert, cooperative and no distress Resp: clear to auscultation bilaterally Cardio: regular rate and rhythm, S1, S2 normal, no murmur, click, rub or gallop GI: No mass tenderness to palpation in the epigastric area most likely secondary to her metastatic liver disease Extremities: extremities normal, atraumatic, no cyanosis or edema  Lab Results:   [facility] 05/24/12 1515 05/23/12 1656  WBC 3.6* 5.4  HGB 10.5* 10.5*  HCT 32.2* 32.0*  PLT 376 455*   BMET  Basename 05/24/12 0343 05/23/12 1656 05/23/12 1407  NA 138 -- 139  K 3.1* -- 3.5  CL 104 -- 101  CO2 24 -- 26  GLUCOSE 100* -- 108*  BUN 8 -- 6.0*  CREATININE 0.60 0.63 --  CALCIUM 8.7 -- 9.4    Studies/Results: No results found.  Medications: I have reviewed the patient's current medications.  Assessment/Plan: 46 y.o. [place] woman admitted 05/23/2012 with uncontrolled pain and poor social situation  1. 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   2.Status post adjuvant chemotherapy July to November 2011, consisting of 4 cycles of dose dense doxorubicin and cyclophosphamide, followed by 4 of 12 planned weekly doses of paclitaxel given adjuvantly, discontinued secondary to peripheral neuropathy symptoms.  3. Status post radiation to the right chest wall, truncated due to problems with the patient missing appointments.  4. Tamoxifen started June of 2012, but discontinued due to nausea.  5. metastatic disease to liver and bone pathologically documented October of 2012, estrogen receptor 100% positive, progesterone receptor 7% positive, with no HER-2 amplification; treatment in the metastatic setting has consisted of:  6.Faslodex started 03/16/2011, discontinued November 2013 with progression.  7.Zoladex every 3 months also started on 03/16/2011, most recent dose 05/13/2012  8.zoledronic acid started November 2011, temporarily held due to pending dental evaluation; received pamidronate 05/23/2012  9. Status post thoracic laminectomy, T9-T11, for impending cord compression on 03/21/2011, followed by irradiation to the spine completed November 2011  10. 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 s/p palliative radiation to the Right hip and clivus to 30 Gy completed 04/07/2012  12. Progression noted by scans in November 2013, at which time Faslodex was discontinued. Patient was started on exemestane in early December, with everolimus added in mid December 2013  13. Nondisplaced right femoral neck fracture, status post right hip arthroplasty on 05/04/2012 under the care of Dr.Matt [facility]. 14. Hypokalemia: I was started the patient on a door  20 mEq by mouth daily for the next 7 days.  Plan: The patient is feeling very well today and ready for discharge. Her pain medication will be in the form off methadone to 10 mg TID and Percocet 5/325 mg  by mouth every 6 hours as needed for breakthrough pain.  She would resume PT at home. She has a follow up with Dr. Darnelle Catalan at on Jun 09, 2012. Discharge summary will be dictated by Dr. Darnelle Catalan after his return.    LOS: 2 days    Nonnie Pickney K. 05/25/2012

## 2012-05-25 NOTE — Progress Notes (Signed)
Pt to be discharged home today. Pt is in NAD, IV is out, all paperwork has been gone over and there are no questions/concerns, assessment is unchanged from this morning. Home medication has been picked up from pharmacy and returned to pt. Pt is being brought downstairs via wheelchair by family and staff.

## 2012-05-26 ENCOUNTER — Ambulatory Visit: Payer: Medicaid Other

## 2012-05-26 ENCOUNTER — Encounter: Payer: Self-pay | Admitting: *Deleted

## 2012-05-26 ENCOUNTER — Other Ambulatory Visit: Payer: Medicaid Other | Admitting: Lab

## 2012-05-26 NOTE — Progress Notes (Signed)
RECEIVED A FAX FROM [facility] CONCERNING A PRIOR AUTHORIZATION FOR METHADONE. THIS REQUEST WAS GIVEN TO ELIZABETH SUTTON IN MANAGED CARE.

## 2012-05-30 ENCOUNTER — Ambulatory Visit: Payer: Medicaid Other | Admitting: Oncology

## 2012-05-30 ENCOUNTER — Encounter: Payer: Self-pay | Admitting: Oncology

## 2012-05-30 NOTE — Progress Notes (Signed)
FAXED REQUEST FOR METHADONE 10 MG #90 TO [facility] [phone]

## 2012-05-31 ENCOUNTER — Other Ambulatory Visit: Payer: Self-pay | Admitting: *Deleted

## 2012-05-31 ENCOUNTER — Emergency Department (HOSPITAL_COMMUNITY)
Admission: EM | Admit: 2012-05-31 | Discharge: 2012-05-31 | Disposition: A | Discharge status: Home / Self Care | Payer: Medicaid Other | Attending: Emergency Medicine | Admitting: Emergency Medicine

## 2012-05-31 ENCOUNTER — Emergency Department (HOSPITAL_COMMUNITY): Payer: Medicaid Other

## 2012-05-31 ENCOUNTER — Encounter (HOSPITAL_COMMUNITY): Payer: Self-pay | Admitting: *Deleted

## 2012-05-31 DIAGNOSIS — I10 Essential (primary) hypertension: Secondary | ICD-10-CM | POA: Insufficient documentation

## 2012-05-31 DIAGNOSIS — Z923 Personal history of irradiation: Secondary | ICD-10-CM | POA: Insufficient documentation

## 2012-05-31 DIAGNOSIS — Z87891 Personal history of nicotine dependence: Secondary | ICD-10-CM | POA: Insufficient documentation

## 2012-05-31 DIAGNOSIS — C7952 Secondary malignant neoplasm of bone marrow: Secondary | ICD-10-CM | POA: Insufficient documentation

## 2012-05-31 DIAGNOSIS — M79609 Pain in unspecified limb: Secondary | ICD-10-CM | POA: Insufficient documentation

## 2012-05-31 DIAGNOSIS — Z85841 Personal history of malignant neoplasm of brain: Secondary | ICD-10-CM | POA: Insufficient documentation

## 2012-05-31 DIAGNOSIS — Z79899 Other long term (current) drug therapy: Secondary | ICD-10-CM | POA: Insufficient documentation

## 2012-05-31 DIAGNOSIS — C7951 Secondary malignant neoplasm of bone: Secondary | ICD-10-CM | POA: Insufficient documentation

## 2012-05-31 DIAGNOSIS — R52 Pain, unspecified: Secondary | ICD-10-CM

## 2012-05-31 DIAGNOSIS — K219 Gastro-esophageal reflux disease without esophagitis: Secondary | ICD-10-CM | POA: Insufficient documentation

## 2012-05-31 DIAGNOSIS — Z853 Personal history of malignant neoplasm of breast: Secondary | ICD-10-CM | POA: Insufficient documentation

## 2012-05-31 DIAGNOSIS — C419 Malignant neoplasm of bone and articular cartilage, unspecified: Secondary | ICD-10-CM

## 2012-05-31 DIAGNOSIS — Z87440 Personal history of urinary (tract) infections: Secondary | ICD-10-CM | POA: Insufficient documentation

## 2012-05-31 DIAGNOSIS — Z8505 Personal history of malignant neoplasm of liver: Secondary | ICD-10-CM | POA: Insufficient documentation

## 2012-05-31 MED ORDER — HYDROMORPHONE HCL PF 1 MG/ML IJ SOLN
1.0000 mg | Freq: Once | INTRAMUSCULAR | Status: AC
  Administered 2012-05-31: 1 mg via INTRAVENOUS
  Filled 2012-05-31: qty 1

## 2012-05-31 MED ORDER — FENTANYL CITRATE 0.05 MG/ML IJ SOLN
100.0000 ug | Freq: Once | INTRAMUSCULAR | Status: AC
  Administered 2012-05-31: 100 ug via INTRAVENOUS
  Filled 2012-05-31: qty 2

## 2012-05-31 MED ORDER — ONDANSETRON HCL 4 MG/2ML IJ SOLN
4.0000 mg | Freq: Once | INTRAMUSCULAR | Status: AC
  Administered 2012-05-31: 4 mg via INTRAVENOUS
  Filled 2012-05-31: qty 2

## 2012-05-31 MED ORDER — FENTANYL CITRATE 0.05 MG/ML IJ SOLN
50.0000 ug | Freq: Once | INTRAMUSCULAR | Status: AC
  Administered 2012-05-31: 50 ug via INTRAVENOUS
  Filled 2012-05-31: qty 2

## 2012-05-31 MED ORDER — OXYCODONE-ACETAMINOPHEN 10-325 MG PO TABS: ORAL_TABLET | ORAL | Status: DC

## 2012-05-31 MED ORDER — SODIUM CHLORIDE 0.9 % IV BOLUS (SEPSIS)
1000.0000 mL | Freq: Once | INTRAVENOUS | Status: AC
  Administered 2012-05-31: 1000 mL via INTRAVENOUS

## 2012-05-31 MED ORDER — OXYCODONE-ACETAMINOPHEN 5-325 MG PO TABS: 2.0000 | ORAL_TABLET | Freq: Four times a day (QID) | ORAL | Status: DC | PRN

## 2012-05-31 MED ORDER — SODIUM CHLORIDE 0.9 % IV BOLUS (SEPSIS): 1000.0000 mL | Freq: Once | INTRAVENOUS | Status: DC

## 2012-05-31 NOTE — Progress Notes (Signed)
[facility] ED CM consulted by ED SW fro EDP, Radford Pax about pt concern with cost of methadone.  CM spoke with Kathrin Penner, Cancer center SW about possible resources for pt.  Reports pt is a long time cancer pt with not many resources available Reports pt is a pt of Dr Darnelle Catalan not Shirline Frees.  Referred CM to Cancer center RN and financial counselors ( [address] [place], [place], [place]) Cm able to leave a voice message for Brandt Loosen at extension [phone] Cm requested a return call about pt resources Reviewed pt concern and need for assist with Methadone.  CM updated Dr Radford Pax, EDP on pending call from cancer center financial counselor and upcoming call to [facility] outpatient pharmacy for cost of methadone.

## 2012-05-31 NOTE — ED Notes (Signed)
Pt reports hx of bone cancer, met to brain. Pt reports right hip replacement 3 weeks ago. Last night pt reports she coughed and she felt like something stuck her in her back and her right leg started hurting. Pain has increased 10/10 from right knee to middle of right side of back.

## 2012-05-31 NOTE — Progress Notes (Signed)
[facility] ED CM consulted with [facility] outpatient pharmacy staff [phone]), Andrey Campanile to confirm that Methadone would cost the pt $3 but needs prior authorization (a call or form completed by prescriber. Concludes that this is probably the issue with pt receiving or obtaining this medication.  CM updated EDP and pt.

## 2012-05-31 NOTE — Clinical Social Work Note (Signed)
CSW received consult from EDP re: pt medications.  CSW consulted RNCM for assistance.  RNCM now following.  Vickii Penna, LCSWA [phone]  Clinical Social Work

## 2012-05-31 NOTE — ED Provider Notes (Signed)
History     CSN: [id number]  Arrival date & time 05/31/12  4098   First MD Initiated Contact with Patient 05/31/12 [phone]      Chief Complaint  Patient presents with  . Leg Pain  . bone cancer pt      HPI Pt reports hx of bone cancer, met to brain. Pt reports right hip replacement 3 weeks ago. Last night pt reports she coughed and she felt like something stuck her in her back and her right leg started hurting. Pain has increased 10/10 from right knee to middle of right side of back  Past Medical History  Diagnosis Date  . Hypertension   . GERD (gastroesophageal reflux disease)   . 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 Pyelonephritis 10/01/11  . Recurrent UTI (urinary tract infection) 10/01/11  . S/P chemotherapy, time since 4-12 weeks   . S/P radiation > 12 weeks   . 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 Brain cancer     spread from breast ca    Past Surgical History  Procedure Date  . Cesarean section 1989; 2010  . Mastectomy 2011    right  . Back surgery 2012    "removed tumor,cancer, from my spine  . Port-a-cath removal 2012    right chest  . Portacath placement 2011    right  . Tubal ligation 2010  . Breast biopsy 2011    right  . Hip arthroplasty 05/04/2012    Procedure: ARTHROPLASTY BIPOLAR HIP;  Surgeon: Shelda Pal, MD;  Location: [facility]S;  Service: Orthopedics;  Laterality: Right;  RIGHT HIP HEMI ARTHROPLASTY     Family History  Problem Relation Age of Onset  . Cancer Brother 75    died of colon cancer at age of 69  . High blood pressure Mother     History  Substance Use Topics  . Smoking status: Former Smoker -- 0.2 packs/day for 31 years    Types: Cigarettes    Quit date: 04/26/2012  . Smokeless tobacco: Never Used  . Alcohol Use: No    OB History    Grav Para Term Preterm Abortions TAB SAB Ect Mult Living                  Review of Systems All other systems reviewed and are  negative Allergies  Review of patient's allergies indicates no known allergies.  Home Medications   Current Outpatient Rx  Name  Route  Sig  Dispense  Refill  . AMLODIPINE BESYLATE 5 MG PO TABS   Oral   Take 5 mg by mouth daily before breakfast.         . DIPHENHYDRAMINE HCL 25 MG PO CAPS   Oral   Take 1 capsule (25 mg total) by mouth every 6 (six) hours as needed for itching, allergies or sleep.   30 capsule      . DSS 100 MG PO CAPS   Oral   Take 100 mg by mouth 2 (two) times daily.   10 capsule      . EVEROLIMUS 5 MG PO TABS   Oral   Take 5 mg by mouth daily.         [name] EXEMESTANE 25 MG PO TABS   Oral   Take 1 tablet (25 mg total) by mouth daily after breakfast.   30 tablet   12   . FERROUS SULFATE 325 (  65 FE) MG PO TABS   Oral   Take 1 tablet (325 mg total) by mouth 3 (three) times daily after meals.         . METHOCARBAMOL 500 MG PO TABS   Oral   Take 1 tablet (500 mg total) by mouth every 6 (six) hours as needed (muscle spasms).   50 tablet   0   . ADULT MULTIVITAMIN W/MINERALS CH   Oral   Take 1 tablet by mouth daily.         . OXYCODONE-ACETAMINOPHEN 5-325 MG PO TABS   Oral   Take 1 tablet by mouth every 6 (six) hours as needed for pain. May take with oxycodone 10mg  . No more then 4 percocet a day - if more needed in a day call MD.   60 tablet   0   . POLYETHYLENE GLYCOL 3350 PO PACK   Oral   Take 17 g by mouth 2 (two) times daily.   14 each      . PROMETHAZINE HCL 25 MG PO TABS   Oral   Take 0.5-1 tablets (12.5-25 mg total) by mouth every 6 (six) hours as needed. Nausea   30 tablet   1   . ENOXAPARIN SODIUM 40 MG/0.4ML [place] SOLN   Subcutaneous   Inject 0.4 mLs (40 mg total) into the skin daily.   12 Syringe   0   . METHADONE HCL 10 MG PO TABS   Oral   Take 1 tablet (10 mg total) by mouth 3 (three) times daily.   90 tablet   0   . POTASSIUM CHLORIDE CRYS ER 20 MEQ PO TBCR   Oral   Take 1 tablet (20 mEq total) by mouth  daily.   7 tablet   0     BP 148/84  Pulse 108  Temp 98.8 F (37.1 C) (Oral)  Resp 25  SpO2 98%  LMP 03/02/2011  Physical Exam  Nursing note and vitals reviewed. Constitutional: She is oriented to person, place, and time. She appears well-developed and well-nourished. She appears distressed (Patient is uncomfortable from pain).  HENT:  Head: Normocephalic and atraumatic.  Eyes: Pupils are equal, round, and reactive to light.  Neck: Normal range of motion.  Cardiovascular: Normal rate and intact distal pulses.   Pulmonary/Chest: No respiratory distress.  Abdominal: Normal appearance. She exhibits no distension.  Musculoskeletal:       Back:  Neurological: She is alert and oriented to person, place, and time. No cranial nerve deficit.  Skin: Skin is warm and dry. No rash noted.  Psychiatric: She has a normal mood and affect. Her behavior is normal.    ED Course  Procedures (including critical care time)  Medications  fentaNYL (SUBLIMAZE) injection 100 mcg (100 mcg Intravenous Given 05/31/12 0935)  HYDROmorphone (DILAUDID) injection 1 mg (1 mg Intravenous Given 05/31/12 0932)  ondansetron (ZOFRAN) injection 4 mg (4 mg Intravenous Given 05/31/12 0928)  sodium chloride 0.9 % bolus 1,000 mL (1000 mL Intravenous New Bag/Given 05/31/12 0927)    Labs Reviewed - No data to display No results found.   1. Pain   2. Metastatic bone cancer       MDM         Nelia Shi, MD 05/31/12 1256

## 2012-05-31 NOTE — Progress Notes (Signed)
CM requested pt call the pharmacy she took her methadone rx to.  She stated she knew Dr Darnelle Catalan was on vacation until 05/31/12, [facility] not [facility] with out pre authorization and then took Rx to [facility] (where she has previously had an account from funded by [facility] in which she did not have to pay for her medications) and is aware generally her [facility].  CM updated EDP and will give pt time to check with her pharmacy to see if medication prior authorization has been completed so she can pick up her medication

## 2012-05-31 NOTE — Telephone Encounter (Signed)
This RN was contacted by the ER stating pt has presented to them for pain control- Alekhya informs them she is unable to get methadone filled due to need for pre authorization and is out of per percocet.  This RN informed ER RN for pt to come to this office.  Discussed above with AB/PA and percocet will be given to use until MD returns to office as well as to allow managed care to follow up on prior authorization.

## 2012-05-31 NOTE — Progress Notes (Signed)
[facility] ED CM called and spoke with at  [facility]. [address], [place], [place] [address] [place] [phone]. Confirms authorization has not going through at this time CM left a voice message for Dr Shirline Frees RN to call [facility] for prior authorization Spoke with Vikki Ports, RN for Dr Darnelle Catalan who request pt see her at cancer center prior to leaving [facility] campus on 05/31/12 to assist with refill of medication.  Vikki Ports state she will follow up in managed care about prior authorization completion. Pt updated about response from Vikki Ports, Archivist.  Pt states she will go to see Vikki Ports.  Pt began to ventilate her feelings about wanting to find out about the source of her back pain versus finding out information from EDP about mets of bone cancer while her daughter is present.  Pt states she took her last 2 percocets on last evening but is not in [facility] ED for pain management.  Pt voiced concern about the importance of methadone CM referred her to Dr Darnelle Catalan during her next appointment to gain clarification why she is now taking methadone and CM unable to inform her of the oncologist reason for rx.  Pt states she is "not addicted to methadone" CM clarified with pt that she needs to see valerie, rn, cancer center will continue to work on pre authorization for methadone and she may also get a call from a cancer center financial counselor.  Discussed pt concerns with EDP and then pt's ED RN came in to discuss pt concerns.

## 2012-06-02 ENCOUNTER — Encounter: Payer: Self-pay | Admitting: *Deleted

## 2012-06-02 NOTE — Progress Notes (Signed)
Received Prior Authorization request from [facility] regarding Methadone 10 mg tablets #90. Forwarded to managed care department.

## 2012-06-07 ENCOUNTER — Telehealth: Payer: Self-pay | Admitting: Internal Medicine

## 2012-06-07 NOTE — Telephone Encounter (Signed)
C/D 06/07/12 for appt.06/15/12

## 2012-06-07 NOTE — Discharge Summary (Signed)
Physician Discharge Summary  Patient ID: [name] MRN: [id number] DOB/AGE: March 02, 1966 47 y.o.  Admit date: 05/23/2012 Discharge date: 05/26/2012  Primary Care Physician:  No primary provider on file.   Discharge Diagnoses:  Uncontrolled pain  Present on Admission:  . Bone metastases . 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 GERD (gastroesophageal reflux disease) . Malignant neoplasm of breast (female), unspecified site  Discharge Medications:    Medication List     As of 06/07/2012  7:19 PM    STOP taking these medications         oxyCODONE 5 MG immediate release tablet   Commonly known as: Oxy IR/ROXICODONE      oxyCODONE-acetaminophen 5-325 MG per tablet   Commonly known as: PERCOCET/ROXICET      TAKE these medications         amLODipine 5 MG tablet   Commonly known as: NORVASC   Take 5 mg by mouth daily before breakfast.      diphenhydrAMINE 25 mg capsule   Commonly known as: BENADRYL   Take 1 capsule (25 mg total) by mouth every 6 (six) hours as needed for itching, allergies or sleep.      DSS 100 MG Caps   Take 100 mg by mouth 2 (two) times daily.      enoxaparin 40 MG/0.4ML injection   Commonly known as: LOVENOX   Inject 0.4 mLs (40 mg total) into the skin daily.      everolimus 5 MG tablet   Commonly known as: AFINITOR   Take 5 mg by mouth daily.      exemestane 25 MG tablet   Commonly known as: AROMASIN   Take 1 tablet (25 mg total) by mouth daily after breakfast.      ferrous sulfate 325 (65 FE) MG tablet   Take 1 tablet (325 mg total) by mouth 3 (three) times daily after meals.      methadone 10 MG tablet   Commonly known as: DOLOPHINE   Take 1 tablet (10 mg total) by mouth 3 (three) times daily.      methocarbamol 500 MG tablet   Commonly known as: ROBAXIN   Take 1 tablet (500 mg total) by mouth every 6 (six) hours as needed (muscle spasms).      multivitamin with minerals Tabs   Take 1 tablet by mouth daily.     polyethylene glycol packet   Commonly known as: MIRALAX / GLYCOLAX   Take 17 g by mouth 2 (two) times daily.      potassium chloride SA 20 MEQ tablet   Commonly known as: K-DUR,KLOR-CON   Take 1 tablet (20 mEq total) by mouth daily.      promethazine 25 MG tablet   Commonly known as: PHENERGAN   Take 0.5-1 tablets (12.5-25 mg total) by mouth every 6 (six) hours as needed. Nausea         Disposition and Follow-up:   Significant Diagnostic Studies:  Dg Thoracic Spine 2 View  05/31/2012  *RADIOLOGY REPORT*  Clinical Data: Back pain and weakness.  THORACIC SPINE - 2 VIEW  Comparison: CT scan 04/20/2012.  Findings: The thoracic vertebral bodies are normally aligned.  No acute compression fracture.  Stable sclerotic metastatic bone disease.  IMPRESSION:  1.  No acute bony findings. 2.  Stable sclerotic metastatic bone disease.   Original Report Authenticated By: Rudie Meyer, M.D.    Dg Lumbar Spine Complete  05/31/2012  *RADIOLOGY REPORT*  Clinical Data: Breast cancer with  osseous metastatic disease.  Back pain.  LUMBAR SPINE - COMPLETE 4+ VIEW  Comparison: Multiple exams, including 04/20/2012 and 03/18/2012  Findings: T9-T11 laminectomy noted, with bony sclerosis in this vicinity compatible with the vertebral metastatic disease.  Lateral demineralization of the right L1 pedicle and sclerosis of the right L2 pedicle likely reflect the underlying malignancy, but overall the osseous metastatic disease is less apparent than on the prior CT scan.  No interval collapse or subluxation.  IMPRESSION:  1.  Reduced conspicuity of the osseous metastatic lesions at L1-L3 compared to prior CT scan, although demineralization of the right L1 pedicle and sclerosis of the right L2 pedicle are compatible with metastatic disease. 2.  Sclerosis in the lower thoracic spine compatible with metastatic disease, with T9-T11 laminectomy noted. 3.  No interval vertebral body collapse or subluxation.   Original Report  Authenticated By: Gaylyn Rong, M.D.     Discharge Laboratory Values: Basic Metabolic Panel: No results found for this basename: NA:5,K:2,CL:5,CO2:5,GLUCOSE:5,BUN:5,CREATININE:5,CALCIUM:5,MG:5,PHOS:5 in the last 168 hours GFR Estimated Creatinine Clearance: 75.6 ml/min (by C-G formula based on Cr of 0.6). Liver Function Tests: No results found for this basename: AST:5,ALT:5,ALKPHOS:5,BILITOT:5,PROT:5,ALBUMIN:5 in the last 168 hours No results found for this basename: LIPASE:5,AMYLASE:5 in the last 168 hours No results found for this basename: AMMONIA:5 in the last 168 hours Coagulation profile No results found for this basename: INR:5,PROTIME:5 in the last 168 hours  CBC: No results found for this basename: WBC:5,NEUTROABS:5,HGB:5,HCT:5,MCV:5,PLT:5 in the last 168 hours Cardiac Enzymes: No results found for this basename: CKTOTAL:5,CKMB:5,CKMBINDEX:5,TROPONINI:5 in the last 168 hours BNP: No components found with this basename: POCBNP:5 CBG: No results found for this basename: GLUCAP:5 in the last 168 hours D-Dimer No results found for this basename: DDIMER:2 in the last 72 hours Hgb A1c No results found for this basename: HGBA1C:2 in the last 72 hours Lipid Profile No results found for this basename: CHOL:2,HDL:2,LDLCALC:2,TRIG:2,CHOLHDL:2,LDLDIRECT:2 in the last 72 hours Thyroid function studies No results found for this basename: TSH,T4TOTAL,FREET3,T3FREE,THYROIDAB in the last 72 hours Anemia work up No results found for this basename: VITAMINB12:2,FOLATE:2,FERRITIN:2,TIBC:2,IRON:2,RETICCTPCT:2 in the last 72 hours Microbiology No results found for this or any previous visit (from the past 240 hour(s)).   Brief H and P: For complete details please refer to admission H and P, but in brief,this is a 47 y.o. [name] admitted 05/23/2012 with uncontrolled pain and poor social situation  1. 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  2.Status post adjuvant chemotherapy July to November 2011, consisting of 4 cycles of dose dense doxorubicin and cyclophosphamide, followed by 4 of 12 planned weekly doses of paclitaxel given adjuvantly, discontinued secondary to peripheral neuropathy symptoms.  3. Status post radiation to the right chest wall, truncated due to problems with the patient missing appointments.  4. Tamoxifen started June of 2012, but discontinued due to nausea.  5. metastatic disease to liver and bone pathologically documented October of 2012, estrogen receptor 100% positive, progesterone receptor 7% positive, with no HER-2 amplification; treatment in the metastatic setting has consisted of:  6.Faslodex started 03/16/2011, discontinued November 2013 with progression.  7.Zoladex every 3 months also started on 03/16/2011, most recent dose 05/13/2012  8.zoledronic acid started November 2011, temporarily held due to pending dental evaluation; received pamidronate 05/23/2012  9. Status post thoracic laminectomy, T9-T11, for impending cord compression on 03/21/2011, followed  by irradiation to the spine completed November 2011  10. 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 s/p palliative radiation to the Right hip and clivus to 30 Gy completed 04/07/2012  12. Progression noted by scans in November 2013, at which time Faslodex was discontinued. Patient was started on exemestane in early December, with everolimus added in mid December 2013  13. Nondisplaced right femoral neck fracture, status post right hip arthroplasty on 05/04/2012 under the care of Dr.Matt [facility].      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 There was also significant element of depression. The patient was going to be a lone through the holidays. She was admitted and started on methadone, as well as intravenous narcotics and oral as needed narcotics. Within 48 hours the patient's pain was much better controlled. She was having regular although slightly hard bowel movements. It was felt safe to discharge her back home, with close followup through the cancer center  Diet:  Regular  Activity:  As tolerated  Condition at Discharge:   Improved  Signed: Dr. Ruthann Cancer [phone]  06/07/2012, 7:19 PM

## 2012-06-09 ENCOUNTER — Telehealth: Payer: Self-pay | Admitting: Oncology

## 2012-06-09 ENCOUNTER — Ambulatory Visit (HOSPITAL_BASED_OUTPATIENT_CLINIC_OR_DEPARTMENT_OTHER): Payer: Medicaid Other | Admitting: Physician Assistant

## 2012-06-09 ENCOUNTER — Encounter: Payer: Self-pay | Admitting: Physician Assistant

## 2012-06-09 ENCOUNTER — Other Ambulatory Visit (HOSPITAL_BASED_OUTPATIENT_CLINIC_OR_DEPARTMENT_OTHER): Payer: Medicaid Other | Admitting: Lab

## 2012-06-09 VITALS — BP 163/91 | HR 108 | Temp 98.5°F | Resp 20 | Ht 62.0 in | Wt 138.0 lb

## 2012-06-09 DIAGNOSIS — C7931 Secondary malignant neoplasm of brain: Secondary | ICD-10-CM

## 2012-06-09 DIAGNOSIS — C50419 Malignant neoplasm of upper-outer quadrant of unspecified female breast: Secondary | ICD-10-CM

## 2012-06-09 DIAGNOSIS — C50919 Malignant neoplasm of unspecified site of unspecified female breast: Secondary | ICD-10-CM

## 2012-06-09 DIAGNOSIS — C787 Secondary malignant neoplasm of liver and intrahepatic bile duct: Secondary | ICD-10-CM

## 2012-06-09 DIAGNOSIS — C7951 Secondary malignant neoplasm of bone: Secondary | ICD-10-CM

## 2012-06-09 DIAGNOSIS — C50519 Malignant neoplasm of lower-outer quadrant of unspecified female breast: Secondary | ICD-10-CM

## 2012-06-09 DIAGNOSIS — R52 Pain, unspecified: Secondary | ICD-10-CM

## 2012-06-09 DIAGNOSIS — D649 Anemia, unspecified: Secondary | ICD-10-CM

## 2012-06-09 LAB — COMPREHENSIVE METABOLIC PANEL (CC13)
ALT: 9 U/L (ref 0–55)
AST: 41 U/L — ABNORMAL HIGH (ref 5–34)
Alkaline Phosphatase: 364 U/L — ABNORMAL HIGH (ref 40–150)
BUN: 10 mg/dL (ref 7.0–26.0)
Creatinine: 0.7 mg/dL (ref 0.6–1.1)
Potassium: 3.5 mEq/L (ref 3.5–5.1)

## 2012-06-09 LAB — CBC WITH DIFFERENTIAL/PLATELET
BASO%: 0.6 % (ref 0.0–2.0)
EOS%: 0.5 % (ref 0.0–7.0)
HCT: 32.2 % — ABNORMAL LOW (ref 34.8–46.6)
LYMPH%: 18 % (ref 14.0–49.7)
MCH: 28.6 pg (ref 25.1–34.0)
MCHC: 33.9 g/dL (ref 31.5–36.0)
NEUT%: 74.1 % (ref 38.4–76.8)
RBC: 3.81 10*6/uL (ref 3.70–5.45)
lymph#: 1 10*3/uL (ref 0.9–3.3)

## 2012-06-09 LAB — CANCER ANTIGEN 27.29: CA 27.29: 89 U/mL — ABNORMAL HIGH (ref 0–39)

## 2012-06-09 MED ORDER — EVEROLIMUS 5 MG PO TABS: 5.0000 mg | ORAL_TABLET | Freq: Every day | ORAL | Status: DC

## 2012-06-09 NOTE — Telephone Encounter (Signed)
gv pt appt schedule for January. °

## 2012-06-09 NOTE — Progress Notes (Signed)
ID: [name]   DOB: 1965-07-01  MR#: [id number]  LKG#:[id number]  HISTORY OF PRESENT ILLNESS: The patient delivered a child on 05/17/2009, after which she felt fullness in the right breast. She eventually brought this to her doctors attention and was scheduled for mammography and ultrasound on 03/08/2009. There were no prior exams for comparison. Mammography showed the breast to be dense, with a 5 cm obscured mass in the outer midportion of the right breast. Mass was easily palpable, and there was also a mass in the outer right periareolar region. They were suspicious microcalcifications in the outer midportion of the right breast, as well as within the mass itself. Ultrasound showed an extensive heterogeneous area, corresponding to the palpable abnormality with multiple solid and cystic foci. Overall, the mass measured in excess of 5 cm, in addition to cysts in the breast.  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 The tumors were ER +100%, PR +80%.  Bilateral breast MRI was obtained 03/22/2009. There was a large area of abnormal enhancement in the lower outer quadrant of the right breast measuring up to 9.7 cm. No abnormal appearing lymph nodes and no other masses in either breast.  Patient underwent definitive right mastectomy and sentinel lymph node sampling on 04/29/2009 under the care of Dr. Derrell Lolling. Final pathology [id number]) confirmed high-grade invasive ductal carcinoma with some mucinous features, 9.5 cm with a micrometastatic deposit in one of 8 sampled lymph nodes. Repeat prognostic panel showed tumor to be ER +93% and PR +51%. HER-2/neu was negative with a ratio of 0.71, with proliferation marker of 36%.  Patient was evaluated by Dr. Darnelle Catalan in January 2011 but failed to keep appointments until she was seen here again in July 2011. In July, she began adjuvant chemotherapy. She received 4 cycles of dose dense  doxorubicin and cyclophosphamide followed by 4 weekly doses of paclitaxel which was discontinued in November 2011 secondary to peripheral neuropathy. The patient then received radiation therapy to the right chest wall which was truncated due to problems with missing appointments.  Patient was started on tamoxifen briefly in June of 2012 which was discontinued due to nausea. The nausea continued, and an abdominal CT in October 2012 confirmed a growth in liver lesions. These were biopsied on 03/10/2011 and showed metastatic adenocarcinoma, still ER positive at 100%, PR +5%. Her subsequent treatments are as detailed below  INTERVAL HISTORY: [name] returns today accompanied by her daughter for followup of her metastatic breast carcinoma. Interval history is remarkable for [name] having been hospitalized between 05/23/2012 and 05/25/2012 for uncontrolled pain. Her pain has improved significantly, and she has finally been able to get her pain medication picked up. She will be on methadone, 10 mg by mouth 3 times a day with Percocet use for breakthrough pain. Her pain continues to be primarily localized in the right hip and right leg. She is able to ambulate with a cane today which is an improvement, having previously utilize either a walker or a wheelchair.   REVIEW OF SYSTEMS: Despite some improvement clinically, [name] is still weak and tired. She has shortness of breath with exertion, but denies any cough since had no chest pain. She's had no fevers or chills. She's had no significant hot flashes. She denies any problems with nausea or emesis and is having regular bowel movements. She's had no signs of abnormal bleeding. She denies any rashes or skin changes.any recent headaches, dizziness, or change in vision.  She has not yet made an appointment to see her dentist, which is keeping her from doing her zoledronic acid.   A detailed review of systems was otherwise stable and noncontributory.   PAST  MEDICAL HISTORY: Past Medical History  Diagnosis Date  . Hypertension   . GERD (gastroesophageal reflux disease)   . 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 Pyelonephritis 10/01/11  . Recurrent UTI (urinary tract infection) 10/01/11  . S/P chemotherapy, time since 4-12 weeks   . S/P radiation > 12 weeks   . 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 Brain cancer     spread from breast ca    PAST SURGICAL HISTORY: Past Surgical History  Procedure Date  . Cesarean section 1989; 2010  . Mastectomy 2011    right  . Back surgery 2012    "removed tumor,cancer, from my spine  . Port-a-cath removal 2012    right chest  . Portacath placement 2011    right  . Tubal ligation 2010  . Breast biopsy 2011    right  . Hip arthroplasty 05/04/2012    Procedure: ARTHROPLASTY BIPOLAR HIP;  Surgeon: Andrea Pal, MD;  Location: [facility];  Service: Orthopedics;  Laterality: Right;  RIGHT HIP HEMI ARTHROPLASTY     FAMILY HISTORY The patient's father is alive at age 9.  The patient's mother is alive at age 61.  She has four sisters.  She had one brother who died from colon cancer at the age of 26.  There is no breast or ovarian cancer history in the family to her knowledge.  GYNECOLOGIC HISTORY: She is GX P2.  First pregnancy to term at age 13.    SOCIAL HISTORY: The patient is not employed.  She lives by herself with her small son whose name is [name] (he is currently staying with his dad in [place]).  Her first child, [name], is studying business in this area.  The patient has one grandchild.  She attends the [employer].    ADVANCED DIRECTIVES: not in place  HEALTH MAINTENANCE: History  Substance Use Topics  . Smoking status: Former Smoker -- 0.2 packs/day for 31 years    Types: Cigarettes    Quit date: 04/26/2012  . Smokeless tobacco: Never Used  . Alcohol Use: No     Colonoscopy:  PAP:  Bone density:  Lipid panel:  No Known  Allergies  Current Outpatient Prescriptions  Medication Sig Dispense Refill  . amLODipine (NORVASC) 5 MG tablet Take 5 mg by mouth daily before breakfast.      . diphenhydrAMINE (BENADRYL) 25 mg capsule Take 1 capsule (25 mg total) by mouth every 6 (six) hours as needed for itching, allergies or sleep.  30 capsule    . docusate sodium 100 MG CAPS Take 100 mg by mouth 2 (two) times daily.  10 capsule    . enoxaparin (LOVENOX) 40 MG/0.4ML injection Inject 0.4 mLs (40 mg total) into the skin daily.  12 Syringe  0  . everolimus (AFINITOR) 5 MG tablet Take 1 tablet (5 mg total) by mouth daily.  30 tablet  0  . exemestane (AROMASIN) 25 MG tablet Take 1 tablet (25 mg total) by mouth daily after breakfast.  30 tablet  12  . ferrous sulfate 325 (65 FE) MG tablet Take 1 tablet (325 mg total) by mouth 3 (three) times daily after meals.      . methadone (  DOLOPHINE) 10 MG tablet Take 1 tablet (10 mg total) by mouth 3 (three) times daily.  90 tablet  0  . methocarbamol (ROBAXIN) 500 MG tablet Take 1 tablet (500 mg total) by mouth every 6 (six) hours as needed (muscle spasms).  50 tablet  0  . Multiple Vitamin (MULTIVITAMIN WITH MINERALS) TABS Take 1 tablet by mouth daily.      [name] oxyCODONE-acetaminophen (PERCOCET) 10-325 MG per tablet 1-2 tabs q 6 hours prn - max of 8 tablets a day  75 tablet  0  . polyethylene glycol (MIRALAX / GLYCOLAX) packet Take 17 g by mouth 2 (two) times daily.  14 each    . potassium chloride SA (K-DUR,KLOR-CON) 20 MEQ tablet Take 1 tablet (20 mEq total) by mouth daily.  7 tablet  0  . promethazine (PHENERGAN) 25 MG tablet Take 0.5-1 tablets (12.5-25 mg total) by mouth every 6 (six) hours as needed. Nausea  30 tablet  1  . [DISCONTINUED] Alum & Mag Hydroxide-Simeth (MAGIC MOUTHWASH W/LIDOCAINE) SOLN Take 5 mLs by mouth 3 (three) times daily as needed (take 15 minutes prior to meals.).  480 mL  0    OBJECTIVE: Middle-aged [place] American woman who appears tired, but comfortable and in  no acute distress.  Filed Vitals:   06/09/12 1317  BP: 163/91  Pulse: 108  Temp: 98.5 F (36.9 C)  Resp: 20     Body mass index is 25.24 kg/(m^2).    ECOG FS:2   Filed Weights   06/09/12 1317  Weight: 138 lb (62.596 kg)   Sclerae unicteric Oropharynx clear No cervical or supraclavicular adenopathy Lungs no rales or rhonchi Heart regular rate and rhythm Abdomen mildly distended, nontender to palpation, positive bowel sounds MSK  limited but improved range of motion in the left lower extremity secondary to pain Minimal peripheral edema in the lower extremities, nonpitting Neuro: nonfocal, alert and oriented x3 Breasts: Deferred   LAB RESULTS: Lab Results  Component Value Date   WBC 5.8 06/09/2012   NEUTROABS 4.3 06/09/2012   HGB 10.9* 06/09/2012   HCT 32.2* 06/09/2012   MCV 84.4 06/09/2012   PLT 466* 06/09/2012      Chemistry      Component Value Date/Time   NA 143 06/09/2012 1236   NA 138 05/24/2012 0343   K 3.5 06/09/2012 1236   K 3.1* 05/24/2012 0343   CL 104 06/09/2012 1236   CL 104 05/24/2012 0343   CO2 27 06/09/2012 1236   CO2 24 05/24/2012 0343   BUN 10.0 06/09/2012 1236   BUN 8 05/24/2012 0343   CREATININE 0.7 06/09/2012 1236   CREATININE 0.60 05/24/2012 0343      Component Value Date/Time   CALCIUM 8.9 06/09/2012 1236   CALCIUM 8.7 05/24/2012 0343   ALKPHOS 364* 06/09/2012 1236   ALKPHOS 296* 05/24/2012 0343   AST 41* 06/09/2012 1236   AST 42* 05/24/2012 0343   ALT 9 06/09/2012 1236   ALT 13 05/24/2012 0343   BILITOT 0.32 06/09/2012 1236   BILITOT 0.3 05/24/2012 0343       Lab Results  Component Value Date   LABCA2 62* 05/13/2012    STUDIES:  Dg Thoracic Spine 2 View  05/31/2012  *RADIOLOGY REPORT*  Clinical Data: Back pain and weakness.  THORACIC SPINE - 2 VIEW  Comparison: CT scan 04/20/2012.  Findings: The thoracic vertebral bodies are normally aligned.  No acute compression fracture.  Stable sclerotic metastatic bone disease.  IMPRESSION:  1.  No  acute bony  findings. 2.  Stable sclerotic metastatic bone disease.   Original Report Authenticated By: Rudie Meyer, M.D.    Dg Lumbar Spine Complete  05/31/2012  *RADIOLOGY REPORT*  Clinical Data: Breast cancer with osseous metastatic disease.  Back pain.  LUMBAR SPINE - COMPLETE 4+ VIEW  Comparison: Multiple exams, including 04/20/2012 and 03/18/2012  Findings: T9-T11 laminectomy noted, with bony sclerosis in this vicinity compatible with the vertebral metastatic disease.  Lateral demineralization of the right L1 pedicle and sclerosis of the right L2 pedicle likely reflect the underlying malignancy, but overall the osseous metastatic disease is less apparent than on the prior CT scan.  No interval collapse or subluxation.  IMPRESSION:  1.  Reduced conspicuity of the osseous metastatic lesions at L1-L3 compared to prior CT scan, although demineralization of the right L1 pedicle and sclerosis of the right L2 pedicle are compatible with metastatic disease. 2.  Sclerosis in the lower thoracic spine compatible with metastatic disease, with T9-T11 laminectomy noted. 3.  No interval vertebral body collapse or subluxation.   Original Report Authenticated By: Gaylyn Rong, M.D.      ASSESSMENT:   47 y.o.  [place] woman    1. 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    2.Status post adjuvant chemotherapy July to November 2011, consisting of 4 cycles of dose dense doxorubicin and cyclophosphamide, followed by 4 of 12 planned weekly doses of paclitaxel given adjuvantly, discontinued secondary to peripheral neuropathy.    3. Status post radiation to the right chest wall, truncated due to problems with the patient missing appointments.   4. Tamoxifen started June of 2012, but discontinued due to nausea.    5. metastatic  disease to liver and bone pathologically documented October of 2012, estrogen receptor 100% positive, progesterone receptor 7% positive, with no HER-2 amplification;  treatment in the metastatic setting has consisted of:   6.Faslodex started 03/16/2011, discontinued November 2013 with progression. Zoladex every 3 months also started on 03/16/2011, and monthly zoledronic acid started November 2011, the Zometa I currently held due to pending dental procedures and possible extractions.             7. Status post thoracic laminectomy, T9-T11, for impending cord compression on 03/21/2011, followed by irradiation to the spine completed November 2011  8. 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 s/p palliative radiation to the Right hip clivus to 30 Gy completed 04/07/2012  10.  Progression noted by scans in November 2013, at which time Faslodex was discontinued. Patient was started on exemestane in early December, with everolimus started in mid December at 5 mg daily. Continues to receive Zoladex injections every 3 months.  11. Nondisplaced right femoral neck fracture, status post right hip arthroplasty on 05/04/2012 under the care of [name].       PLAN:  Clinically, [name] does in fact seem to be doing much better. She'll continue with her current treatment plan, specifically exemestane, 25 mg daily, and everolimus, 5 mg daily.  We have refilled the everolimus today.  She'll continue to receive Zoladex injections every 3 months, last given 05/13/2012, and due again in March.   [name] was finally able to have her methadone prescription filled, and will be taking 10 mg  3 times daily, and utilizing Percocet for breakthrough pain. I have asked her to contact us immediately if the pain worsens, or if she feels like she is not tolerating this combination well. We certainly want her  functional status to be as good as possible.   All this was reviewed in detail with [name] today, and she  voiced her understanding of the above. She will call with any changes or problems.  [name]    06/09/2012

## 2012-06-15 ENCOUNTER — Encounter (HOSPITAL_COMMUNITY): Payer: Self-pay | Admitting: Emergency Medicine

## 2012-06-15 ENCOUNTER — Emergency Department (HOSPITAL_COMMUNITY)
Admission: EM | Admit: 2012-06-15 | Discharge: 2012-06-15 | Disposition: A | Discharge status: Home / Self Care | Payer: Medicaid Other | Attending: Emergency Medicine | Admitting: Emergency Medicine

## 2012-06-15 ENCOUNTER — Emergency Department (HOSPITAL_COMMUNITY): Payer: Medicaid Other

## 2012-06-15 ENCOUNTER — Other Ambulatory Visit: Payer: Self-pay | Admitting: *Deleted

## 2012-06-15 DIAGNOSIS — C7951 Secondary malignant neoplasm of bone: Secondary | ICD-10-CM | POA: Insufficient documentation

## 2012-06-15 DIAGNOSIS — R141 Gas pain: Secondary | ICD-10-CM | POA: Insufficient documentation

## 2012-06-15 DIAGNOSIS — R1900 Intra-abdominal and pelvic swelling, mass and lump, unspecified site: Secondary | ICD-10-CM | POA: Insufficient documentation

## 2012-06-15 DIAGNOSIS — R143 Flatulence: Secondary | ICD-10-CM | POA: Insufficient documentation

## 2012-06-15 DIAGNOSIS — C787 Secondary malignant neoplasm of liver and intrahepatic bile duct: Secondary | ICD-10-CM | POA: Insufficient documentation

## 2012-06-15 DIAGNOSIS — R109 Unspecified abdominal pain: Secondary | ICD-10-CM | POA: Insufficient documentation

## 2012-06-15 DIAGNOSIS — R112 Nausea with vomiting, unspecified: Secondary | ICD-10-CM | POA: Insufficient documentation

## 2012-06-15 DIAGNOSIS — Z853 Personal history of malignant neoplasm of breast: Secondary | ICD-10-CM | POA: Insufficient documentation

## 2012-06-15 DIAGNOSIS — Z9851 Tubal ligation status: Secondary | ICD-10-CM | POA: Insufficient documentation

## 2012-06-15 DIAGNOSIS — R110 Nausea: Secondary | ICD-10-CM

## 2012-06-15 DIAGNOSIS — R52 Pain, unspecified: Secondary | ICD-10-CM

## 2012-06-15 DIAGNOSIS — Z87891 Personal history of nicotine dependence: Secondary | ICD-10-CM | POA: Insufficient documentation

## 2012-06-15 DIAGNOSIS — K219 Gastro-esophageal reflux disease without esophagitis: Secondary | ICD-10-CM | POA: Insufficient documentation

## 2012-06-15 DIAGNOSIS — Z79899 Other long term (current) drug therapy: Secondary | ICD-10-CM | POA: Insufficient documentation

## 2012-06-15 DIAGNOSIS — Z9889 Other specified postprocedural states: Secondary | ICD-10-CM | POA: Insufficient documentation

## 2012-06-15 DIAGNOSIS — R142 Eructation: Secondary | ICD-10-CM | POA: Insufficient documentation

## 2012-06-15 DIAGNOSIS — Z87448 Personal history of other diseases of urinary system: Secondary | ICD-10-CM | POA: Insufficient documentation

## 2012-06-15 DIAGNOSIS — C7949 Secondary malignant neoplasm of other parts of nervous system: Secondary | ICD-10-CM | POA: Insufficient documentation

## 2012-06-15 DIAGNOSIS — Z87440 Personal history of urinary (tract) infections: Secondary | ICD-10-CM | POA: Insufficient documentation

## 2012-06-15 DIAGNOSIS — Z9010 Acquired absence of unspecified breast and nipple: Secondary | ICD-10-CM | POA: Insufficient documentation

## 2012-06-15 DIAGNOSIS — I10 Essential (primary) hypertension: Secondary | ICD-10-CM | POA: Insufficient documentation

## 2012-06-15 DIAGNOSIS — C7931 Secondary malignant neoplasm of brain: Secondary | ICD-10-CM | POA: Insufficient documentation

## 2012-06-15 LAB — CBC WITH DIFFERENTIAL/PLATELET
Basophils Relative: 0 % (ref 0–1)
Eosinophils Absolute: 0.1 10*3/uL (ref 0.0–0.7)
Eosinophils Relative: 1 % (ref 0–5)
HCT: 32.9 % — ABNORMAL LOW (ref 36.0–46.0)
Hemoglobin: 10.8 g/dL — ABNORMAL LOW (ref 12.0–15.0)
MCH: 27.1 pg (ref 26.0–34.0)
MCHC: 32.8 g/dL (ref 30.0–36.0)
MCV: 82.7 fL (ref 78.0–100.0)
Monocytes Absolute: 0.5 10*3/uL (ref 0.1–1.0)
Monocytes Relative: 7 % (ref 3–12)

## 2012-06-15 LAB — COMPREHENSIVE METABOLIC PANEL
Albumin: 3.5 g/dL (ref 3.5–5.2)
BUN: 5 mg/dL — ABNORMAL LOW (ref 6–23)
Calcium: 9.8 mg/dL (ref 8.4–10.5)
Chloride: 103 mEq/L (ref 96–112)
Creatinine, Ser: 0.71 mg/dL (ref 0.50–1.10)
Total Bilirubin: 0.4 mg/dL (ref 0.3–1.2)
Total Protein: 7.8 g/dL (ref 6.0–8.3)

## 2012-06-15 LAB — URINALYSIS, ROUTINE W REFLEX MICROSCOPIC
Ketones, ur: NEGATIVE mg/dL
Nitrite: NEGATIVE
Specific Gravity, Urine: 1.008 (ref 1.005–1.030)
Urobilinogen, UA: 1 mg/dL (ref 0.0–1.0)
pH: 7.5 (ref 5.0–8.0)

## 2012-06-15 LAB — URINE MICROSCOPIC-ADD ON

## 2012-06-15 LAB — LIPASE, BLOOD: Lipase: 15 U/L (ref 11–59)

## 2012-06-15 MED ORDER — HYDROMORPHONE HCL PF 1 MG/ML IJ SOLN
1.0000 mg | Freq: Once | INTRAMUSCULAR | Status: AC
  Administered 2012-06-15: 1 mg via INTRAVENOUS
  Filled 2012-06-15: qty 1

## 2012-06-15 MED ORDER — ENSURE COMPLETE PO LIQD
237.0000 mL | Freq: Once | ORAL | Status: AC
  Administered 2012-06-15: 237 mL via ORAL
  Filled 2012-06-15: qty 237

## 2012-06-15 MED ORDER — POTASSIUM CHLORIDE CRYS ER 20 MEQ PO TBCR
40.0000 meq | EXTENDED_RELEASE_TABLET | Freq: Once | ORAL | Status: AC
  Administered 2012-06-15: 40 meq via ORAL
  Filled 2012-06-15: qty 2

## 2012-06-15 MED ORDER — ONDANSETRON HCL 4 MG/2ML IJ SOLN
4.0000 mg | Freq: Once | INTRAMUSCULAR | Status: AC
  Administered 2012-06-15: 4 mg via INTRAVENOUS
  Filled 2012-06-15: qty 2

## 2012-06-15 MED ORDER — ONDANSETRON HCL 4 MG PO TABS: 4.0000 mg | ORAL_TABLET | Freq: Four times a day (QID) | ORAL | Status: DC

## 2012-06-15 MED ORDER — POTASSIUM CHLORIDE IN NACL 20-0.9 MEQ/L-% IV SOLN
Freq: Once | INTRAVENOUS | Status: DC
  Filled 2012-06-15: qty 1000

## 2012-06-15 MED ORDER — SODIUM CHLORIDE 0.9 % IV BOLUS (SEPSIS)
1000.0000 mL | INTRAVENOUS | Status: AC
  Administered 2012-06-15: 1000 mL via INTRAVENOUS

## 2012-06-15 MED ORDER — POTASSIUM CHLORIDE 10 MEQ/100ML IV SOLN
INTRAVENOUS | Status: AC
  Administered 2012-06-15: 10 meq via INTRAVENOUS
  Filled 2012-06-15: qty 100

## 2012-06-15 MED ORDER — POTASSIUM CHLORIDE 10 MEQ/100ML IV SOLN
10.0000 meq | Freq: Once | INTRAVENOUS | Status: AC
  Administered 2012-06-15: 10 meq via INTRAVENOUS

## 2012-06-15 NOTE — Telephone Encounter (Signed)
Pt called to state concern with ongoing symptoms of " no appetite-and when I do eat I get nauseated "  She states she has shown AB/PA an area " in my stomach that is like a hard ball and hurts ". " and all I do is sleep and I am so tired "  Pt began to become emotional discussing above with crying.  This RN discussed with Sailor use of current medications can cause some of her symptoms but her cancer too can be cause- goal is to treat symptoms and give her pain control with good function.  She is using phenergan round the clock for the nausea which is making her more sleepy. She is not on any type of pepcid medication.  She states she is having bowel movements.  Per this discussion plan is for prescription for reglan 5mg  and omeprazole 20mg  to be called to the pharmacy. With use of the above she can stop the phenergan.  She is currently scheduled for visit on 1/21 and medications can be reviewed at that time for further changes for improvement.  This RN called medications to pharmacy.  Second call received with pt crying saying " I am going to go the ER because this thing in my stomach is hurting too much "  This RN informed pt if she feels her need is more urgent she should proceed to the ER.  This RN then informed MD.

## 2012-06-15 NOTE — ED Notes (Signed)
Patient still unable to provide urine sample

## 2012-06-15 NOTE — ED Provider Notes (Signed)
Medical screening examination/treatment/procedure(s) were performed by non-physician practitioner and as supervising physician I was immediately available for consultation/collaboration.   Richardean Canal, MD 06/15/12 [phone]

## 2012-06-15 NOTE — ED Notes (Signed)
Pt states unable to provide urine sample at this time.

## 2012-06-15 NOTE — ED Provider Notes (Signed)
History     CSN: [id number]  Arrival date & time 06/15/12  1512   First MD Initiated Contact with Patient 06/15/12 1529      Chief Complaint  Patient presents with  . Abdominal Pain  . Nausea  . Emesis    (Consider location/radiation/quality/duration/timing/severity/associated sxs/prior treatment) HPI Comments: This is a 47 year old female, past medical history remarkable for metastatic bone cancer, who presents emergency department with chief complaint of abdominal pain. Patient states the pain began this morning. She states that she has vomited 3 times, but denies any hematemesis. Patient states that her pain is 10 out of 10. The pain does not radiate, and there are no aggravating or alleviating factors. Patient denies chest pain, shortness of breath, diarrhea, and constipation. She is tried taking Percocet with no relief.   The patient's oncologist is Dr. Darnelle Catalan.  Patient is not receiving chemo or radiation therapy at this time.  She was recently admitted for pain control and was discharged on 05/23/2012.  The history is provided by the patient. No language interpreter was used.    Past Medical History  Diagnosis Date  . Hypertension   . GERD (gastroesophageal reflux disease)   . 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 Pyelonephritis 10/01/11  . Recurrent UTI (urinary tract infection) 10/01/11  . S/P chemotherapy, time since 4-12 weeks   . S/P radiation > 12 weeks   . 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 Brain cancer     spread from breast ca    Past Surgical History  Procedure Date  . Cesarean section 1989; 2010  . Mastectomy 2011    right  . Back surgery 2012    "removed tumor,cancer, from my spine  . Port-a-cath removal 2012    right chest  . Portacath placement 2011    right  . Tubal ligation 2010  . Breast biopsy 2011    right  . Hip arthroplasty 05/04/2012    Procedure: ARTHROPLASTY BIPOLAR HIP;  Surgeon: Shelda Pal, MD;  Location: [facility];  Service: Orthopedics;  Laterality: Right;  RIGHT HIP HEMI ARTHROPLASTY     Family History  Problem Relation Age of Onset  . Cancer Brother 56    died of colon cancer at age of 58  . High blood pressure Mother     History  Substance Use Topics  . Smoking status: Former Smoker -- 0.2 packs/day for 31 years    Types: Cigarettes    Quit date: 04/26/2012  . Smokeless tobacco: Never Used  . Alcohol Use: No    OB History    Grav Para Term Preterm Abortions TAB SAB Ect Mult Living                  Review of Systems  All other systems reviewed and are negative.    Allergies  Review of patient's allergies indicates no known allergies.  Home Medications   Current Outpatient Rx  Name  Route  Sig  Dispense  Refill  . AMLODIPINE BESYLATE 5 MG PO TABS   Oral   Take 5 mg by mouth daily before breakfast.         . DIPHENHYDRAMINE HCL 25 MG PO CAPS   Oral   Take 1 capsule (25 mg total) by mouth every 6 (six) hours as needed for itching, allergies or sleep.   30 capsule      . DOCUSATE SODIUM  100 MG PO CAPS   Oral   Take 100 mg by mouth 2 (two) times daily as needed. For stool softener.         [name] EVEROLIMUS 5 MG PO TABS   Oral   Take 1 tablet (5 mg total) by mouth daily.   30 tablet   0   . EXEMESTANE 25 MG PO TABS   Oral   Take 1 tablet (25 mg total) by mouth daily after breakfast.   30 tablet   12   . FERROUS SULFATE 325 (65 FE) MG PO TABS   Oral   Take 1 tablet (325 mg total) by mouth 3 (three) times daily after meals.         . METHADONE HCL 10 MG PO TABS   Oral   Take 1 tablet (10 mg total) by mouth 3 (three) times daily.   90 tablet   0   . METHOCARBAMOL 500 MG PO TABS   Oral   Take 1 tablet (500 mg total) by mouth every 6 (six) hours as needed (muscle spasms).   50 tablet   0   . ADULT MULTIVITAMIN W/MINERALS CH   Oral   Take 1 tablet by mouth daily.         . OXYCODONE-ACETAMINOPHEN 10-325 MG PO TABS    Oral   Take 1-2 tablets by mouth every 6 (six) hours as needed. For pain.         [name] POTASSIUM CHLORIDE CRYS ER 20 MEQ PO TBCR   Oral   Take 1 tablet (20 mEq total) by mouth daily.   7 tablet   0   . PROMETHAZINE HCL 12.5 MG PO TABS   Oral   Take 12.5-25 mg by mouth every 6 (six) hours as needed. For nausea.         [name] ENOXAPARIN SODIUM 40 MG/0.4ML [place] SOLN   Subcutaneous   Inject 0.4 mLs (40 mg total) into the skin daily.   12 Syringe   0     BP 163/91  Pulse 90  Temp 98.6 F (37 C) (Oral)  Resp 16  SpO2 95%  LMP 03/02/2011  Physical Exam  Nursing note and vitals reviewed. Constitutional: She is oriented to person, place, and time. She appears well-developed and well-nourished.  HENT:  Head: Normocephalic and atraumatic.  Eyes: Conjunctivae normal and EOM are normal. Pupils are equal, round, and reactive to light.  Neck: Normal range of motion. Neck supple.  Cardiovascular: Normal rate and regular rhythm.  Exam reveals no gallop and no friction rub.   No murmur heard. Pulmonary/Chest: Effort normal and breath sounds normal. No respiratory distress. She has no wheezes. She has no rales. She exhibits no tenderness.  Abdominal: Soft. Bowel sounds are normal. She exhibits distension and mass. There is tenderness. There is no rebound and no guarding.       Tender to palpation over right upper quadrant and mid epigastric region, no left lower quadrant or right lower quadrant tenderness, mildly distended abdomen, no rebound tenderness.  Musculoskeletal: Normal range of motion. She exhibits no edema and no tenderness.  Neurological: She is alert and oriented to person, place, and time.  Skin: Skin is warm and dry.  Psychiatric: She has a normal mood and affect. Her behavior is normal. Judgment and thought content normal.    ED Course  Procedures (including critical care time)   Labs Reviewed  CBC WITH DIFFERENTIAL  COMPREHENSIVE METABOLIC PANEL   Results for  orders  placed during the hospital encounter of 06/15/12  CBC WITH DIFFERENTIAL      Component Value Range   WBC 6.8  4.0 - 10.5 K/uL   RBC 3.98  3.87 - 5.11 MIL/uL   Hemoglobin 10.8 (*) 12.0 - 15.0 g/dL   HCT 04.5 (*) 40.9 - 81.1 %   MCV 82.7  78.0 - 100.0 fL   MCH 27.1  26.0 - 34.0 pg   MCHC 32.8  30.0 - 36.0 g/dL   RDW 91.4 (*) 78.2 - 95.6 %   Platelets 458 (*) 150 - 400 K/uL   Neutrophils Relative 69  43 - 77 %   Neutro Abs 4.7  1.7 - 7.7 K/uL   Lymphocytes Relative 23  12 - 46 %   Lymphs Abs 1.6  0.7 - 4.0 K/uL   Monocytes Relative 7  3 - 12 %   Monocytes Absolute 0.5  0.1 - 1.0 K/uL   Eosinophils Relative 1  0 - 5 %   Eosinophils Absolute 0.1  0.0 - 0.7 K/uL   Basophils Relative 0  0 - 1 %   Basophils Absolute 0.0  0.0 - 0.1 K/uL  COMPREHENSIVE METABOLIC PANEL      Component Value Range   Sodium 142  135 - 145 mEq/L   Potassium 3.0 (*) 3.5 - 5.1 mEq/L   Chloride 103  96 - 112 mEq/L   CO2 25  19 - 32 mEq/L   Glucose, Bld 100 (*) 70 - 99 mg/dL   BUN 5 (*) 6 - 23 mg/dL   Creatinine, Ser 2.13  0.50 - 1.10 mg/dL   Calcium 9.8  8.4 - 08.6 mg/dL   Total Protein 7.8  6.0 - 8.3 g/dL   Albumin 3.5  3.5 - 5.2 g/dL   AST 35  0 - 37 U/L   ALT 10  0 - 35 U/L   Alkaline Phosphatase 327 (*) 39 - 117 U/L   Total Bilirubin 0.4  0.3 - 1.2 mg/dL   GFR calc non Af Amer >90  >90 mL/min   GFR calc Af Amer >90  >90 mL/min  LIPASE, BLOOD      Component Value Range   Lipase 15  11 - 59 U/L     Ct Head Wo Contrast  06/15/2012  *RADIOLOGY REPORT*  Clinical Data: Headache.  Breast cancer.  Nausea.  The  CT HEAD WITHOUT CONTRAST  Technique:  Contiguous axial images were obtained from the base of the skull through the vertex without contrast.  Comparison: 02/20/2012.  02/15/2012.  Findings: The brain has a normal appearance without evidence of atrophy, old or acute infarction, mass lesion, hemorrhage, hydrocephalus or extra-axial collection.  Previously seen destructive lesion of the clivus with  extension into the inferior sphenoid sinus has been treated and appears healed.  No ongoing destructive process is evident in the skull base or anywhere in the calvarium.  Visualized sinuses, middle ears and mastoids are clear.  IMPRESSION: No intracranial abnormality.  Treatment of a previous clival metastasis.  No evidence of residual or recurrent active tumor in that region.   Original Report Authenticated By: Paulina Fusi, M.D.    Dg Abd Acute W/chest  06/15/2012  *RADIOLOGY REPORT*  Clinical Data: Abdominal pain, possible bowel obstruction, weakness  ACUTE ABDOMEN SERIES (ABDOMEN 2 VIEW & CHEST 1 VIEW)  Comparison: Chest x-ray of 02/15/2012 and CT chest of 04/20/2012  Findings: No active infiltrate or effusion is seen.  Mediastinal contours are stable.  The heart is within normal limits in size.  Supine and erect views of the abdomen show a somewhat fluid distended stomach.  However no other evidence of bowel obstruction is seen.  Is there any clinical suspicion of gastric outlet obstruction?  No free air is seen.  A right hip hemiarthroplasty is noted.  IMPRESSION:  1.  No bowel obstruction.  There is fluid distention of the stomach however and gastric outlet obstruction cannot be excluded.  No free air. 2.  No active lung disease.   Original Report Authenticated By: Dwyane Dee, M.D.       1. Pain   2. Nausea       MDM  48 year old female with metastatic cancer, who presents emergency department with abdominal pain.  Patient has been discussed with Dr. Silverio Lay, who recommends head CT and abdominal series.  Will supplement the patient's potassium.  Discussed the results of the imaging with Dr. Silverio Lay, who tells me that it is not very likely that this is gastric outlet obstruction, and that I can discharge the patient pending normal labs and resolution of pain.  Patient is feeling much better after receiving zofran and dilaudid.  Will re-evaluate again after the patient receives her potassium.  7:29  PM Patient is feeling well.  I will give her some zofran to go home with.  Patient requests an Ensure drink to go home with.  Patient understands and agrees with the plan.  She has will follow-up with her oncologist.  The patient is stable and ready for discharge.        Roxy Horseman, PA-C 06/15/12 2004

## 2012-06-15 NOTE — ED Notes (Signed)
Pt states she woke up this morning with abd pain.  Pain 10/10.  States she has thrown up 3 times.  Denies diarrhea.  Had a normal bowel movement.  Pt has breast/bone cancer.  Pt is poor historian.  States she doesn't want to answer questions because she is tired of being in pain "every freakin day".

## 2012-06-16 ENCOUNTER — Other Ambulatory Visit: Payer: Self-pay | Admitting: Oncology

## 2012-06-21 ENCOUNTER — Ambulatory Visit: Payer: Medicaid Other | Attending: Orthopedic Surgery | Admitting: Rehabilitative and Restorative Service Providers"

## 2012-06-21 ENCOUNTER — Telehealth: Payer: Self-pay | Admitting: Oncology

## 2012-06-21 ENCOUNTER — Ambulatory Visit (HOSPITAL_BASED_OUTPATIENT_CLINIC_OR_DEPARTMENT_OTHER): Payer: Medicaid Other | Admitting: Physician Assistant

## 2012-06-21 ENCOUNTER — Other Ambulatory Visit (HOSPITAL_BASED_OUTPATIENT_CLINIC_OR_DEPARTMENT_OTHER): Payer: Medicaid Other | Admitting: Lab

## 2012-06-21 ENCOUNTER — Encounter: Payer: Self-pay | Admitting: Physician Assistant

## 2012-06-21 VITALS — BP 135/84 | HR 106 | Temp 97.1°F | Resp 20 | Ht 62.0 in | Wt 135.8 lb

## 2012-06-21 DIAGNOSIS — C50519 Malignant neoplasm of lower-outer quadrant of unspecified female breast: Secondary | ICD-10-CM

## 2012-06-21 DIAGNOSIS — R7989 Other specified abnormal findings of blood chemistry: Secondary | ICD-10-CM

## 2012-06-21 DIAGNOSIS — C50919 Malignant neoplasm of unspecified site of unspecified female breast: Secondary | ICD-10-CM

## 2012-06-21 DIAGNOSIS — C7951 Secondary malignant neoplasm of bone: Secondary | ICD-10-CM

## 2012-06-21 DIAGNOSIS — C787 Secondary malignant neoplasm of liver and intrahepatic bile duct: Secondary | ICD-10-CM

## 2012-06-21 HISTORY — DX: Hypercalcemia: E83.52

## 2012-06-21 LAB — CBC WITH DIFFERENTIAL/PLATELET
BASO%: 0.6 % (ref 0.0–2.0)
EOS%: 0.5 % (ref 0.0–7.0)
HCT: 30.3 % — ABNORMAL LOW (ref 34.8–46.6)
LYMPH%: 13 % — ABNORMAL LOW (ref 14.0–49.7)
MCH: 27.5 pg (ref 25.1–34.0)
MCHC: 33.4 g/dL (ref 31.5–36.0)
MCV: 82.4 fL (ref 79.5–101.0)
MONO%: 7.6 % (ref 0.0–14.0)
NEUT%: 78.3 % — ABNORMAL HIGH (ref 38.4–76.8)
lymph#: 1 10*3/uL (ref 0.9–3.3)

## 2012-06-21 LAB — COMPREHENSIVE METABOLIC PANEL (CC13)
ALT: 10 U/L (ref 0–55)
AST: 48 U/L — ABNORMAL HIGH (ref 5–34)
Alkaline Phosphatase: 302 U/L — ABNORMAL HIGH (ref 40–150)
BUN: 19 mg/dL (ref 7.0–26.0)
Chloride: 95 mEq/L — ABNORMAL LOW (ref 98–107)
Creatinine: 1.4 mg/dL — ABNORMAL HIGH (ref 0.6–1.1)
Total Bilirubin: 0.68 mg/dL (ref 0.20–1.20)

## 2012-06-21 MED ORDER — OXYCODONE-ACETAMINOPHEN 10-325 MG PO TABS: 1.0000 | ORAL_TABLET | Freq: Four times a day (QID) | ORAL | Status: DC | PRN

## 2012-06-21 MED ORDER — OXYCODONE HCL 20 MG PO TB12: 20.0000 mg | ORAL_TABLET | Freq: Two times a day (BID) | ORAL | Status: DC

## 2012-06-21 NOTE — Progress Notes (Signed)
ID: [name]   DOB: 02/01/1966  MR#: [id number]  CSN#:[id number]  HISTORY OF PRESENT ILLNESS: The patient delivered a child on 05/17/2009, after which she felt fullness in the right breast. She eventually brought this to her doctors attention and was scheduled for mammography and ultrasound on 03/08/2009. There were no prior exams for comparison. Mammography showed the breast to be dense, with a 5 cm obscured mass in the outer midportion of the right breast. Mass was easily palpable, and there was also a mass in the outer right periareolar region. They were suspicious microcalcifications in the outer midportion of the right breast, as well as within the mass itself. Ultrasound showed an extensive heterogeneous area, corresponding to the palpable abnormality with multiple solid and cystic foci. Overall, the mass measured in excess of 5 cm, in addition to cysts in the breast.  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 The tumors were ER +100%, PR +80%.  Bilateral breast MRI was obtained 03/22/2009. There was a large area of abnormal enhancement in the lower outer quadrant of the right breast measuring up to 9.7 cm. No abnormal appearing lymph nodes and no other masses in either breast.  Patient underwent definitive right mastectomy and sentinel lymph node sampling on 04/29/2009 under the care of Dr. Derrell Lolling. 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 Repeat prognostic panel showed tumor to be ER +93% and PR +51%. HER-2/neu was negative with a ratio of 0.71, with proliferation marker of 36%.  Patient was evaluated by Dr. Darnelle Catalan in January 2011 but failed to keep appointments until she was seen here again in July 2011. In July, she began adjuvant chemotherapy. She received 4 cycles of dose dense  doxorubicin and cyclophosphamide followed by 4 weekly doses of paclitaxel which was discontinued in November 2011 secondary to peripheral neuropathy. The patient then received radiation therapy to the right chest wall which was truncated due to problems with missing appointments.  Patient was started on tamoxifen briefly in June of 2012 which was discontinued due to nausea. The nausea continued, and an abdominal CT in October 2012 confirmed a growth in liver lesions. These were biopsied on 03/10/2011 and showed metastatic adenocarcinoma, still ER positive at 100%, PR +5%. Her subsequent treatments are as detailed below  INTERVAL HISTORY: [name] returns today  for followup of her metastatic breast carcinoma. Interval history is remarkable for a visit to the emergency department on 06/15/2012 with complaints of abdominal pain, nausea, and emesis. Fortunately, a CT of the head showed no intracranial abnormalities or evidence of residual or recurrent active tumor. An x-ray of the abdomen and chest showed no bowel obstruction and no active lung disease. She was discharged with Zofran, and has felt better since that time.  [name] continues to have chronic pain. Her pain continues to be primarily localized in the right hip and right leg. She is able to ambulate with a cane today which is an improvement, having previously utilize either a walker or a wheelchair.  Her current pain regimen includes methadone with Percocet for breakthrough pain. She does not felt at the methadone is helpful at all, and would like to consider other options. She does feel that the oxycodone/APAP gives her relief.   REVIEW OF SYSTEMS: [name] continues to complain of fatigue. She has shortness of breath with exertion, but denies any cough.  She has had no chest pain or palpitations. She's had no fevers or chills. She has occasional hot flashes. She's had no mouth ulcers or oral sensitivity. Since her recent trip to the ED, she denies  any problems with nausea or emesis.  She continues to have occasional abdominal pain, centrally located in the epigastric region. She has problems with constipation for which she is using suppositories. She's had no signs of abnormal bleeding. She denies any rashes or skin changes.in fact, she feels that her fingernails are growing "better than ever".  She denies any recent headaches, dizziness, or change in vision.  She denies any peripheral swelling.  A detailed review of systems was otherwise stable and noncontributory.   PAST MEDICAL HISTORY: Past Medical History  Diagnosis Date  . Hypertension   . GERD (gastroesophageal reflux disease)   . 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 Pyelonephritis 10/01/11  . Recurrent UTI (urinary tract infection) 10/01/11  . S/P chemotherapy, time since 4-12 weeks   . S/P radiation > 12 weeks   . 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 Brain cancer     spread from breast ca  . Hypercalcemia 06/21/2012    PAST SURGICAL HISTORY: Past Surgical History  Procedure Date  . Cesarean section 1989; 2010  . Mastectomy 2011    right  . Back surgery 2012    "removed tumor,cancer, from my spine  . Port-a-cath removal 2012    right chest  . Portacath placement 2011    right  . Tubal ligation 2010  . Breast biopsy 2011    right  . Hip arthroplasty 05/04/2012    Procedure: ARTHROPLASTY BIPOLAR HIP;  Surgeon: Shelda Pal, MD;  Location: [facility];  Service: Orthopedics;  Laterality: Right;  RIGHT HIP HEMI ARTHROPLASTY     FAMILY HISTORY The patient's father is alive at age 30.  The patient's mother is alive at age 60.  She has four sisters.  She had one brother who died from colon cancer at the age of 69.  There is no breast or ovarian cancer history in the family to her knowledge.  GYNECOLOGIC HISTORY: She is GX P2.  First pregnancy to term at age 70.    SOCIAL HISTORY: The patient is not employed.  She lives by  herself with her small son whose name is [name] (he is currently staying with his dad in [place]).  Her first child, [name], is studying business in this area.  The patient has one grandchild.  She attends the [employer].    ADVANCED DIRECTIVES: not in place  HEALTH MAINTENANCE: History  Substance Use Topics  . Smoking status: Former Smoker -- 0.2 packs/day for 31 years    Types: Cigarettes    Quit date: 04/26/2012  . Smokeless tobacco: Never Used  . Alcohol Use: No     Colonoscopy:  PAP:  Bone density:  Lipid panel:  No Known Allergies  Current Outpatient Prescriptions  Medication Sig Dispense Refill  . amLODipine (NORVASC) 5 MG tablet Take 5 mg by mouth daily before breakfast.      . diphenhydrAMINE (BENADRYL) 25 mg capsule Take 1 capsule (25 mg total) by mouth every 6 (six) hours as needed for itching, allergies or sleep.  30 capsule    . docusate sodium (COLACE) 100 MG capsule Take 100 mg by mouth 2 (two) times daily as needed. For stool softener.      [name]  enoxaparin (LOVENOX) 40 MG/0.4ML injection Inject 0.4 mLs (40 mg total) into the skin daily.  12 Syringe  0  . everolimus (AFINITOR) 5 MG tablet Take 1 tablet (5 mg total) by mouth daily.  30 tablet  0  . exemestane (AROMASIN) 25 MG tablet Take 1 tablet (25 mg total) by mouth daily after breakfast.  30 tablet  12  . ferrous sulfate 325 (65 FE) MG tablet Take 1 tablet (325 mg total) by mouth 3 (three) times daily after meals.      . methocarbamol (ROBAXIN) 500 MG tablet Take 1 tablet (500 mg total) by mouth every 6 (six) hours as needed (muscle spasms).  50 tablet  0  . Multiple Vitamin (MULTIVITAMIN WITH MINERALS) TABS Take 1 tablet by mouth daily.      . ondansetron (ZOFRAN) 4 MG tablet Take 1 tablet (4 mg total) by mouth every 6 (six) hours.  12 tablet  0  . oxyCODONE (OXYCONTIN) 20 MG 12 hr tablet Take 1 tablet (20 mg total) by mouth every 12 (twelve) hours.  60 tablet  0  . oxyCODONE-acetaminophen (PERCOCET) 10-325  MG per tablet Take 1-2 tablets by mouth every 6 (six) hours as needed. For pain.  Maximum of 8 tablets in 24 hours.  90 tablet  0  . potassium chloride SA (K-DUR,KLOR-CON) 20 MEQ tablet Take 1 tablet (20 mEq total) by mouth daily.  7 tablet  0  . promethazine (PHENERGAN) 12.5 MG tablet Take 12.5-25 mg by mouth every 6 (six) hours as needed. For nausea.      . [DISCONTINUED] Alum & Mag Hydroxide-Simeth (MAGIC MOUTHWASH W/LIDOCAINE) SOLN Take 5 mLs by mouth 3 (three) times daily as needed (take 15 minutes prior to meals.).  480 mL  0    OBJECTIVE: Middle-aged [place] American woman who appears tired, but comfortable and in no acute distress.  Filed Vitals:   06/21/12 1156  BP: 135/84  Pulse: 106  Temp: 97.1 F (36.2 C)  Resp: 20     Body mass index is 24.84 kg/(m^2).    ECOG FS:2   Filed Weights   06/21/12 1156  Weight: 135 lb 12.8 oz (61.598 kg)   Sclerae unicteric Oropharynx clear, poor dentition No cervical or supraclavicular adenopathy Lungs no rales or rhonchi Heart regular rate and rhythm Abdomen distended, nontender to palpation, positive bowel sounds MSK:  limited but improved range of motion in the left lower extremity secondary to pain Minimal peripheral edema in the lower extremities, nonpitting Neuro: nonfocal, well oriented  Breasts: Deferred   LAB RESULTS: Lab Results  Component Value Date   WBC 8.0 06/21/2012   NEUTROABS 6.3 06/21/2012   HGB 10.1* 06/21/2012   HCT 30.3* 06/21/2012   MCV 82.4 06/21/2012   PLT 361 06/21/2012      Chemistry      Component Value Date/Time   NA 137 06/21/2012 1123   NA 142 06/15/2012 1540   K 4.6 06/21/2012 1123   K 3.0* 06/15/2012 1540   CL 95* 06/21/2012 1123   CL 103 06/15/2012 1540   CO2 29 06/21/2012 1123   CO2 25 06/15/2012 1540   BUN 19.0 06/21/2012 1123   BUN 5* 06/15/2012 1540   CREATININE 1.4* 06/21/2012 1123   CREATININE 0.71 06/15/2012 1540      Component Value Date/Time   CALCIUM 10.6* 06/21/2012 1123   CALCIUM 9.8  06/15/2012 1540   ALKPHOS 302* 06/21/2012 1123   ALKPHOS 327* 06/15/2012 1540   AST 48* 06/21/2012 1123  AST 35 06/15/2012 1540   ALT 10 06/21/2012 1123   ALT 10 06/15/2012 1540   BILITOT 0.68 06/21/2012 1123   BILITOT 0.4 06/15/2012 1540       Lab Results  Component Value Date   LABCA2 89* 06/09/2012    STUDIES:  Dg Thoracic Spine 2 View  05/31/2012  *RADIOLOGY REPORT*  Clinical Data: Back pain and weakness.  THORACIC SPINE - 2 VIEW  Comparison: CT scan 04/20/2012.  Findings: The thoracic vertebral bodies are normally aligned.  No acute compression fracture.  Stable sclerotic metastatic bone disease.  IMPRESSION:  1.  No acute bony findings. 2.  Stable sclerotic metastatic bone disease.   Original Report Authenticated By: Rudie Meyer, M.D.    Dg Lumbar Spine Complete  05/31/2012  *RADIOLOGY REPORT*  Clinical Data: Breast cancer with osseous metastatic disease.  Back pain.  LUMBAR SPINE - COMPLETE 4+ VIEW  Comparison: Multiple exams, including 04/20/2012 and 03/18/2012  Findings: T9-T11 laminectomy noted, with bony sclerosis in this vicinity compatible with the vertebral metastatic disease.  Lateral demineralization of the right L1 pedicle and sclerosis of the right L2 pedicle likely reflect the underlying malignancy, but overall the osseous metastatic disease is less apparent than on the prior CT scan.  No interval collapse or subluxation.  IMPRESSION:  1.  Reduced conspicuity of the osseous metastatic lesions at L1-L3 compared to prior CT scan, although demineralization of the right L1 pedicle and sclerosis of the right L2 pedicle are compatible with metastatic disease. 2.  Sclerosis in the lower thoracic spine compatible with metastatic disease, with T9-T11 laminectomy noted. 3.  No interval vertebral body collapse or subluxation.   Original Report Authenticated By: Gaylyn Rong, M.D.    Ct Head Wo Contrast  06/15/2012  *RADIOLOGY REPORT*  Clinical Data: Headache.  Breast cancer.  Nausea.   The  CT HEAD WITHOUT CONTRAST  Technique:  Contiguous axial images were obtained from the base of the skull through the vertex without contrast.  Comparison: 02/20/2012.  02/15/2012.  Findings: The brain has a normal appearance without evidence of atrophy, old or acute infarction, mass lesion, hemorrhage, hydrocephalus or extra-axial collection.  Previously seen destructive lesion of the clivus with extension into the inferior sphenoid sinus has been treated and appears healed.  No ongoing destructive process is evident in the skull base or anywhere in the calvarium.  Visualized sinuses, middle ears and mastoids are clear.  IMPRESSION: No intracranial abnormality.  Treatment of a previous clival metastasis.  No evidence of residual or recurrent active tumor in that region.   Original Report Authenticated By: Paulina Fusi, M.D.    Dg Abd Acute W/chest  06/15/2012  *RADIOLOGY REPORT*  Clinical Data: Abdominal pain, possible bowel obstruction, weakness  ACUTE ABDOMEN SERIES (ABDOMEN 2 VIEW & CHEST 1 VIEW)  Comparison: Chest x-ray of 02/15/2012 and CT chest of 04/20/2012  Findings: No active infiltrate or effusion is seen.  Mediastinal contours are stable.  The heart is within normal limits in size.  Supine and erect views of the abdomen show a somewhat fluid distended stomach.  However no other evidence of bowel obstruction is seen.  Is there any clinical suspicion of gastric outlet obstruction?  No free air is seen.  A right hip hemiarthroplasty is noted.  IMPRESSION:  1.  No bowel obstruction.  There is fluid distention of the stomach however and gastric outlet obstruction cannot be excluded.  No free air. 2.  No active lung disease.   Original Report Authenticated By:  Dwyane Dee, M.D.      ASSESSMENT:   47 y.o.  [place] woman    1. Status post right mastectomy and axillary lymph node dissection November 2010 for a T3 N1(mic) Stage IIIA invasive ductal carcinoma, grade 3, estrogen and progesterone  receptor positive, HER-2/neu negative, with an MIB-1 of 36%.    2.Status post adjuvant chemotherapy July to November 2011, consisting of 4 cycles of dose dense doxorubicin and cyclophosphamide, followed by 4 of 12 planned weekly doses of paclitaxel given adjuvantly, discontinued secondary to peripheral neuropathy.    3. Status post radiation to the right chest wall, truncated due to problems with the patient missing appointments.   4. Tamoxifen started June of 2012, but discontinued due to nausea.    5. metastatic disease to liver and bone pathologically documented October of 2012, estrogen receptor 100% positive, progesterone receptor 7% positive, with no HER-2 amplification;  treatment in the metastatic setting has consisted of:   6.Faslodex started 03/16/2011, discontinued November 2013 with progression. Zoladex every 3 months also started on 03/16/2011, and monthly zoledronic acid started November 2011, the Zometa I currently held due to pending dental procedures and possible extractions.             7. Status post thoracic laminectomy, T9-T11, for impending cord compression on 03/21/2011, followed by irradiation to the spine completed November 2011  8. 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 s/p palliative radiation to the Right hip clivus to 30 Gy completed 04/07/2012  10.  Progression noted by scans in November 2013, at which time Faslodex was discontinued. Patient was started on exemestane in early December, with everolimus started in mid December at 5 mg daily. Continues to receive Zoladex injections every 3 months.  11. Nondisplaced right femoral neck fracture, status post right hip arthroplasty on 05/04/2012 under the care of Dr.Matt [facility].  12. Hypercalcemia with elevated serum creatinine       PLAN:  Over half of our 45 minute appointment today was spent  reviewing [name]'s treatment plan, including her pain control regimen. This was reviewed with Dr. Darnelle Catalan.   Clinically, Trystyn appears to be stable.  She'll continue with her current treatment plan, specifically exemestane, 25 mg daily, and everolimus, 5 mg daily.  She'll continue to receive Zoladex injections every 3 months, last given 05/13/2012, and due again in March.   She really hates the methadone, so we will discontinue that drug. Instead, I am prescribing OxyContin, 20 mg by mouth twice a day with oxycodone/APAP, 10/325 mg, taken for breakthrough pain. She knows to take no more than 8 tablets of oxycodone/APAP within 24 hours, but I did encourage her to take as few as possible secondary to the Tylenol component with her known liver metastases.  She knows to take stool softeners on a daily basis as long she is taking these medications, the goal being to have an easy bowel movement at least every other day. I have also refilled her antinausea medication, ondansetron.  With regards to her hypercalcemia, our plan will be to proceed with denosumab when she returns to see me next week on January 29, and I will discuss this plan with Didi at that time. She does have poor dentition, but we will caution her not to have any teeth pulled unless absolutely necessary. At this time, the hypercalcemia is a more pressing issue.  All this was reviewed in detail  with [name] today, and she  voiced her understanding of the above. She will call with any changes or problems.  [name]    06/21/2012

## 2012-06-21 NOTE — Telephone Encounter (Signed)
S/W THE PT'S DTR AND SHE IS AWARE OF THE APPT ON 06/29/2012

## 2012-06-22 ENCOUNTER — Other Ambulatory Visit: Payer: Medicaid Other | Admitting: Lab

## 2012-06-22 ENCOUNTER — Ambulatory Visit: Payer: Medicaid Other

## 2012-06-27 ENCOUNTER — Other Ambulatory Visit: Payer: Self-pay | Admitting: *Deleted

## 2012-06-29 ENCOUNTER — Ambulatory Visit: Payer: Medicaid Other | Admitting: Physician Assistant

## 2012-06-29 ENCOUNTER — Other Ambulatory Visit: Payer: Medicaid Other | Admitting: Lab

## 2012-06-30 ENCOUNTER — Other Ambulatory Visit: Payer: Self-pay | Admitting: *Deleted

## 2012-06-30 DIAGNOSIS — C50919 Malignant neoplasm of unspecified site of unspecified female breast: Secondary | ICD-10-CM

## 2012-06-30 MED ORDER — OXYCODONE-ACETAMINOPHEN 10-325 MG PO TABS: 1.0000 | ORAL_TABLET | Freq: Four times a day (QID) | ORAL | Status: DC | PRN

## 2012-06-30 NOTE — Telephone Encounter (Signed)
[name] called to state need for refill on pain medications as well as all other medications. This RN reviewed with pt current use of long acting and short acting.  [name] states she takes 8 tabs of the breakthrough a day and takes extra of the long acting.  Noted per dispense amount of long acting with pt stating she has approximately 8 tabs left would mean she is taking up to 5 tabs a day. [name] could not verify that is how much she is taking.  This RN reiterated with pt need to be able to take the long acting as ordered and if she takes more she needs to call the office due to legality of documenting use for appropriate refills. Presently unable to refill long acting until she is seen by provider.  Short acting may be refilled and request will be reviewed with MD/ML for refill and plan.

## 2012-07-06 ENCOUNTER — Other Ambulatory Visit: Payer: Self-pay | Admitting: Emergency Medicine

## 2012-07-06 ENCOUNTER — Other Ambulatory Visit: Payer: Medicaid Other | Admitting: Lab

## 2012-07-06 ENCOUNTER — Ambulatory Visit: Payer: Medicaid Other | Admitting: Physician Assistant

## 2012-07-06 ENCOUNTER — Telehealth: Payer: Self-pay | Admitting: Oncology

## 2012-07-06 NOTE — Telephone Encounter (Signed)
S/w the pt's dtr and she is aware of the appts on 07/11/2012@10 :45am

## 2012-07-07 ENCOUNTER — Emergency Department (HOSPITAL_COMMUNITY): Payer: Medicaid Other

## 2012-07-07 ENCOUNTER — Observation Stay (HOSPITAL_COMMUNITY)
Admission: EM | Admit: 2012-07-07 | Discharge: 2012-07-10 | Disposition: A | Discharge status: Home / Self Care | Payer: Medicaid Other | Attending: Family Medicine | Admitting: Family Medicine

## 2012-07-07 ENCOUNTER — Encounter (HOSPITAL_COMMUNITY): Payer: Self-pay | Admitting: Emergency Medicine

## 2012-07-07 DIAGNOSIS — E876 Hypokalemia: Secondary | ICD-10-CM | POA: Insufficient documentation

## 2012-07-07 DIAGNOSIS — C787 Secondary malignant neoplasm of liver and intrahepatic bile duct: Secondary | ICD-10-CM | POA: Insufficient documentation

## 2012-07-07 DIAGNOSIS — C7952 Secondary malignant neoplasm of bone marrow: Secondary | ICD-10-CM | POA: Insufficient documentation

## 2012-07-07 DIAGNOSIS — K5900 Constipation, unspecified: Secondary | ICD-10-CM

## 2012-07-07 DIAGNOSIS — R531 Weakness: Secondary | ICD-10-CM

## 2012-07-07 DIAGNOSIS — I10 Essential (primary) hypertension: Secondary | ICD-10-CM | POA: Insufficient documentation

## 2012-07-07 DIAGNOSIS — C801 Malignant (primary) neoplasm, unspecified: Secondary | ICD-10-CM

## 2012-07-07 DIAGNOSIS — Z9010 Acquired absence of unspecified breast and nipple: Secondary | ICD-10-CM | POA: Insufficient documentation

## 2012-07-07 DIAGNOSIS — C7931 Secondary malignant neoplasm of brain: Secondary | ICD-10-CM | POA: Insufficient documentation

## 2012-07-07 DIAGNOSIS — R52 Pain, unspecified: Principal | ICD-10-CM | POA: Insufficient documentation

## 2012-07-07 DIAGNOSIS — N12 Tubulo-interstitial nephritis, not specified as acute or chronic: Secondary | ICD-10-CM

## 2012-07-07 DIAGNOSIS — R5381 Other malaise: Secondary | ICD-10-CM | POA: Insufficient documentation

## 2012-07-07 DIAGNOSIS — R7989 Other specified abnormal findings of blood chemistry: Secondary | ICD-10-CM

## 2012-07-07 DIAGNOSIS — Z66 Do not resuscitate: Secondary | ICD-10-CM | POA: Insufficient documentation

## 2012-07-07 DIAGNOSIS — K219 Gastro-esophageal reflux disease without esophagitis: Secondary | ICD-10-CM | POA: Insufficient documentation

## 2012-07-07 DIAGNOSIS — C799 Secondary malignant neoplasm of unspecified site: Secondary | ICD-10-CM

## 2012-07-07 DIAGNOSIS — Z96649 Presence of unspecified artificial hip joint: Secondary | ICD-10-CM

## 2012-07-07 DIAGNOSIS — Z87891 Personal history of nicotine dependence: Secondary | ICD-10-CM | POA: Insufficient documentation

## 2012-07-07 DIAGNOSIS — C7951 Secondary malignant neoplasm of bone: Secondary | ICD-10-CM | POA: Insufficient documentation

## 2012-07-07 DIAGNOSIS — C50919 Malignant neoplasm of unspecified site of unspecified female breast: Secondary | ICD-10-CM | POA: Insufficient documentation

## 2012-07-07 DIAGNOSIS — R109 Unspecified abdominal pain: Secondary | ICD-10-CM | POA: Insufficient documentation

## 2012-07-07 LAB — CBC WITH DIFFERENTIAL/PLATELET
Basophils Absolute: 0 10*3/uL (ref 0.0–0.1)
Basophils Relative: 0 % (ref 0–1)
Eosinophils Relative: 1 % (ref 0–5)
HCT: 30.9 % — ABNORMAL LOW (ref 36.0–46.0)
Hemoglobin: 10.1 g/dL — ABNORMAL LOW (ref 12.0–15.0)
MCH: 26.8 pg (ref 26.0–34.0)
MCHC: 32.7 g/dL (ref 30.0–36.0)
MCV: 82 fL (ref 78.0–100.0)
Monocytes Absolute: 0.3 10*3/uL (ref 0.1–1.0)
Monocytes Relative: 5 % (ref 3–12)
RDW: 18.5 % — ABNORMAL HIGH (ref 11.5–15.5)

## 2012-07-07 LAB — LIPASE, BLOOD: Lipase: 12 U/L (ref 11–59)

## 2012-07-07 LAB — PREGNANCY, URINE: Preg Test, Ur: NEGATIVE

## 2012-07-07 LAB — COMPREHENSIVE METABOLIC PANEL
AST: 34 U/L (ref 0–37)
Albumin: 3.4 g/dL — ABNORMAL LOW (ref 3.5–5.2)
BUN: 5 mg/dL — ABNORMAL LOW (ref 6–23)
Calcium: 9.2 mg/dL (ref 8.4–10.5)
Creatinine, Ser: 0.7 mg/dL (ref 0.50–1.10)
GFR calc non Af Amer: >90 mL/min (ref >90)
Total Bilirubin: 0.5 mg/dL (ref 0.3–1.2)

## 2012-07-07 LAB — URINALYSIS, ROUTINE W REFLEX MICROSCOPIC
Bilirubin Urine: NEGATIVE
Nitrite: NEGATIVE
Protein, ur: NEGATIVE mg/dL
Specific Gravity, Urine: 1.023 (ref 1.005–1.030)
Urobilinogen, UA: 1 mg/dL (ref 0.0–1.0)

## 2012-07-07 LAB — URINE MICROSCOPIC-ADD ON

## 2012-07-07 MED ORDER — ONDANSETRON HCL 4 MG/2ML IJ SOLN
4.0000 mg | INTRAMUSCULAR | Status: DC | PRN
  Administered 2012-07-07: 4 mg via INTRAVENOUS

## 2012-07-07 MED ORDER — POTASSIUM CHLORIDE 20 MEQ/15ML (10%) PO LIQD
40.0000 meq | Freq: Once | ORAL | Status: AC
  Administered 2012-07-07: 40 meq via ORAL
  Filled 2012-07-07 (×2): qty 15

## 2012-07-07 MED ORDER — EXEMESTANE 25 MG PO TABS
25.0000 mg | ORAL_TABLET | Freq: Every day | ORAL | Status: DC
  Administered 2012-07-08 – 2012-07-10 (×3): 25 mg via ORAL
  Filled 2012-07-07: qty 1

## 2012-07-07 MED ORDER — ONDANSETRON HCL 4 MG/2ML IJ SOLN: 4.0000 mg | Freq: Four times a day (QID) | INTRAMUSCULAR | Status: DC | PRN

## 2012-07-07 MED ORDER — POTASSIUM CHLORIDE CRYS ER 20 MEQ PO TBCR
20.0000 meq | EXTENDED_RELEASE_TABLET | Freq: Every day | ORAL | Status: DC
  Administered 2012-07-08 – 2012-07-10 (×3): 20 meq via ORAL
  Filled 2012-07-07 (×3): qty 1

## 2012-07-07 MED ORDER — SODIUM CHLORIDE 0.9 % IV SOLN
INTRAVENOUS | Status: DC
  Administered 2012-07-07: 18:00:00 via INTRAVENOUS

## 2012-07-07 MED ORDER — OXYCODONE-ACETAMINOPHEN 10-325 MG PO TABS
1.0000 | ORAL_TABLET | Freq: Four times a day (QID) | ORAL | Status: DC | PRN
Start: 2012-07-07 — End: 2012-07-07

## 2012-07-07 MED ORDER — MAGNESIUM CITRATE PO SOLN
1.0000 | Freq: Once | ORAL | Status: AC
  Administered 2012-07-08: 1 via ORAL

## 2012-07-07 MED ORDER — POTASSIUM CHLORIDE CRYS ER 20 MEQ PO TBCR
60.0000 meq | EXTENDED_RELEASE_TABLET | Freq: Four times a day (QID) | ORAL | Status: AC
  Administered 2012-07-07 – 2012-07-08 (×2): 60 meq via ORAL
  Filled 2012-07-07 (×2): qty 3

## 2012-07-07 MED ORDER — FENTANYL CITRATE 0.05 MG/ML IJ SOLN
INTRAMUSCULAR | Status: AC
  Filled 2012-07-07: qty 2

## 2012-07-07 MED ORDER — SODIUM CHLORIDE 0.9 % IV SOLN
INTRAVENOUS | Status: DC
  Administered 2012-07-08: 1000 mL via INTRAVENOUS
  Administered 2012-07-09: 12:00:00 via INTRAVENOUS

## 2012-07-07 MED ORDER — EVEROLIMUS 5 MG PO TABS
5.0000 mg | ORAL_TABLET | Freq: Every day | ORAL | Status: DC
  Administered 2012-07-08 – 2012-07-10 (×3): 5 mg via ORAL

## 2012-07-07 MED ORDER — ONDANSETRON HCL 4 MG/2ML IJ SOLN
4.0000 mg | INTRAMUSCULAR | Status: DC | PRN
  Administered 2012-07-07: 4 mg via INTRAVENOUS
  Filled 2012-07-07 (×2): qty 2

## 2012-07-07 MED ORDER — ACETAMINOPHEN 650 MG RE SUPP: 650.0000 mg | Freq: Four times a day (QID) | RECTAL | Status: DC | PRN

## 2012-07-07 MED ORDER — MORPHINE SULFATE 2 MG/ML IJ SOLN
2.0000 mg | INTRAMUSCULAR | Status: DC | PRN
  Administered 2012-07-07 – 2012-07-10 (×6): 2 mg via INTRAVENOUS
  Filled 2012-07-07 (×7): qty 1

## 2012-07-07 MED ORDER — HYDROMORPHONE HCL PF 1 MG/ML IJ SOLN
1.0000 mg | INTRAMUSCULAR | Status: DC | PRN
  Administered 2012-07-07: 1 mg via INTRAVENOUS
  Filled 2012-07-07: qty 1

## 2012-07-07 MED ORDER — HEPARIN SODIUM (PORCINE) 5000 UNIT/ML IJ SOLN
5000.0000 [IU] | Freq: Three times a day (TID) | INTRAMUSCULAR | Status: DC
  Administered 2012-07-07 – 2012-07-10 (×8): 5000 [IU] via SUBCUTANEOUS
  Filled 2012-07-07 (×11): qty 1

## 2012-07-07 MED ORDER — ADULT MULTIVITAMIN W/MINERALS CH
1.0000 | ORAL_TABLET | Freq: Every day | ORAL | Status: DC
  Administered 2012-07-08 – 2012-07-10 (×3): 1 via ORAL
  Filled 2012-07-07 (×3): qty 1

## 2012-07-07 MED ORDER — OXYCODONE HCL 5 MG PO TABS
5.0000 mg | ORAL_TABLET | Freq: Four times a day (QID) | ORAL | Status: DC | PRN
  Administered 2012-07-08 – 2012-07-10 (×7): 10 mg via ORAL
  Filled 2012-07-07 (×7): qty 2

## 2012-07-07 MED ORDER — ACETAMINOPHEN 325 MG PO TABS: 650.0000 mg | ORAL_TABLET | Freq: Four times a day (QID) | ORAL | Status: DC | PRN

## 2012-07-07 MED ORDER — POTASSIUM CHLORIDE 10 MEQ/100ML IV SOLN
10.0000 meq | Freq: Once | INTRAVENOUS | Status: AC
  Administered 2012-07-07: 10 meq via INTRAVENOUS
  Filled 2012-07-07: qty 100

## 2012-07-07 MED ORDER — OXYCODONE-ACETAMINOPHEN 5-325 MG PO TABS
1.0000 | ORAL_TABLET | Freq: Four times a day (QID) | ORAL | Status: DC | PRN
Start: 2012-07-07 — End: 2012-07-10
  Administered 2012-07-08: 2 via ORAL
  Filled 2012-07-07: qty 2

## 2012-07-07 MED ORDER — ONDANSETRON HCL 4 MG PO TABS
4.0000 mg | ORAL_TABLET | Freq: Four times a day (QID) | ORAL | Status: DC
  Administered 2012-07-07 – 2012-07-10 (×9): 4 mg via ORAL
  Filled 2012-07-07 (×12): qty 1

## 2012-07-07 MED ORDER — OXYCODONE HCL ER 20 MG PO T12A
20.0000 mg | EXTENDED_RELEASE_TABLET | Freq: Two times a day (BID) | ORAL | Status: DC
  Administered 2012-07-07 – 2012-07-10 (×6): 20 mg via ORAL
  Filled 2012-07-07 (×6): qty 1

## 2012-07-07 MED ORDER — IOHEXOL 300 MG/ML  SOLN
50.0000 mL | Freq: Once | INTRAMUSCULAR | Status: AC | PRN
  Administered 2012-07-07: 50 mL via ORAL

## 2012-07-07 MED ORDER — ONDANSETRON HCL 4 MG PO TABS
4.0000 mg | ORAL_TABLET | Freq: Four times a day (QID) | ORAL | Status: DC | PRN
  Filled 2012-07-07 (×3): qty 1

## 2012-07-07 MED ORDER — POLYETHYLENE GLYCOL 3350 17 G PO PACK
17.0000 g | PACK | Freq: Every day | ORAL | Status: DC
  Administered 2012-07-08 – 2012-07-10 (×3): 17 g via ORAL
  Filled 2012-07-07 (×3): qty 1

## 2012-07-07 MED ORDER — FENTANYL CITRATE 0.05 MG/ML IJ SOLN
100.0000 ug | INTRAMUSCULAR | Status: AC | PRN
  Administered 2012-07-07 (×2): 100 ug via INTRAVENOUS
  Filled 2012-07-07 (×2): qty 2

## 2012-07-07 MED ORDER — IOHEXOL 300 MG/ML  SOLN
80.0000 mL | Freq: Once | INTRAMUSCULAR | Status: AC | PRN
  Administered 2012-07-07: 80 mL via INTRAVENOUS

## 2012-07-07 MED ORDER — FERROUS SULFATE 325 (65 FE) MG PO TABS
325.0000 mg | ORAL_TABLET | Freq: Three times a day (TID) | ORAL | Status: DC
  Administered 2012-07-08 – 2012-07-10 (×7): 325 mg via ORAL
  Filled 2012-07-07 (×10): qty 1

## 2012-07-07 NOTE — ED Notes (Addendum)
PER EMS- pt picked up from home with c/o abd pain, nausea, and vomiting x2 days. Reports pain 10/10.  Alert and oriented. Hx of breat cancer and METS.

## 2012-07-07 NOTE — ED Notes (Signed)
UEA:[id number]> Expected date:<BR> Expected time:<BR> Means of arrival:<BR> Comments:<BR> EMS abd pain breast CA

## 2012-07-07 NOTE — ED Provider Notes (Signed)
History     CSN: [id number]  Arrival date & time 07/07/12  1636   First MD Initiated Contact with Patient 07/07/12 1708      Chief Complaint  Patient presents with  . Abdominal Pain  . Nausea  . Emesis    HPI Pt was seen at 1725.   Per pt, c/o gradual onset and worsening of constant generalized abd "pain" for the past 2 days.  Has been associated with multiple intermittent episodes of N/V.  LD XRT approx 3-4 months ago, LD chemo daily PO and every 3 month injections with last injection 05/2012.  Denies diarrhea, no fevers, no back pain, no rash, no CP/SOB, no black or blood in stools or emesis.       Past Medical History  Diagnosis Date  . Hypertension   . GERD (gastroesophageal reflux disease)   . 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 Pyelonephritis 10/01/11  . Recurrent UTI (urinary tract infection) 10/01/11  . S/P chemotherapy, time since 4-12 weeks   . S/P radiation > 12 weeks   . 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 Brain cancer     spread from breast ca  . Hypercalcemia 06/21/2012    Past Surgical History  Procedure Date  . Cesarean section 1989; 2010  . Mastectomy 2011    right  . Back surgery 2012    "removed tumor,cancer, from my spine  . Port-a-cath removal 2012    right chest  . Portacath placement 2011    right  . Tubal ligation 2010  . Breast biopsy 2011    right  . Hip arthroplasty 05/04/2012    Procedure: ARTHROPLASTY BIPOLAR HIP;  Surgeon: Shelda Pal, MD;  Location: [facility];  Service: Orthopedics;  Laterality: Right;  RIGHT HIP HEMI ARTHROPLASTY     Family History  Problem Relation Age of Onset  . Cancer Brother 35    died of colon cancer at age of 33  . High blood pressure Mother     History  Substance Use Topics  . Smoking status: Former Smoker -- 0.2 packs/day for 31 years    Types: Cigarettes    Quit date: 04/26/2012  . Smokeless tobacco: Never Used  . Alcohol Use: No     Review of  Systems ROS: Statement: All systems negative except as marked or noted in the HPI; Constitutional: Negative for fever and chills. ; ; Eyes: Negative for eye pain, redness and discharge. ; ; ENMT: Negative for ear pain, hoarseness, nasal congestion, sinus pressure and sore throat. ; ; Cardiovascular: Negative for chest pain, palpitations, diaphoresis, dyspnea and peripheral edema. ; ; Respiratory: Negative for cough, wheezing and stridor. ; ; Gastrointestinal: +N/V, abd pain. Negative for diarrhea, blood in stool, hematemesis, jaundice and rectal bleeding. . ; ; Genitourinary: Negative for dysuria, flank pain and hematuria. ; ; Musculoskeletal: Negative for back pain and neck pain. Negative for swelling and trauma.; ; Skin: Negative for pruritus, rash, abrasions, blisters, bruising and skin lesion.; ; Neuro: Negative for headache, lightheadedness and neck stiffness. Negative for weakness, altered level of consciousness , altered mental status, extremity weakness, paresthesias, involuntary movement, seizure and syncope.       Allergies  Review of patient's allergies indicates no known allergies.  Home Medications   Current Outpatient Rx  Name  Route  Sig  Dispense  Refill  . AMLODIPINE BESYLATE 5 MG PO TABS   Oral  Take 5 mg by mouth daily before breakfast.         . EVEROLIMUS 5 MG PO TABS   Oral   Take 5 mg by mouth daily.         [name] EXEMESTANE 25 MG PO TABS   Oral   Take 25 mg by mouth daily after breakfast.         . FERROUS SULFATE 325 (65 FE) MG PO TABS   Oral   Take 325 mg by mouth 3 (three) times daily after meals.         . METHOCARBAMOL 500 MG PO TABS   Oral   Take 500 mg by mouth every 6 (six) hours as needed. Muscle spasms         . ADULT MULTIVITAMIN W/MINERALS CH   Oral   Take 1 tablet by mouth daily.         [name] ONDANSETRON HCL 4 MG PO TABS   Oral   Take 4 mg by mouth every 6 (six) hours.         . OXYCODONE HCL ER 20 MG PO TB12   Oral   Take 20 mg  by mouth every 12 (twelve) hours.         . OXYCODONE-ACETAMINOPHEN 10-325 MG PO TABS   Oral   Take 1-2 tablets by mouth every 6 (six) hours as needed. For pain.  Maximum of 8 tablets in 24 hours.         [name] POTASSIUM CHLORIDE CRYS ER 20 MEQ PO TBCR   Oral   Take 20 mEq by mouth daily.         [name] PROMETHAZINE HCL 12.5 MG PO TABS   Oral   Take 12.5-25 mg by mouth every 6 (six) hours as needed. For nausea.           BP 157/71  Pulse 98  Temp 98.7 F (37.1 C) (Oral)  Resp 18  SpO2 100%  LMP 03/02/2011  Physical Exam 1730: Physical examination:  Nursing notes reviewed; Vital signs and O2 SAT reviewed;  Constitutional: Well developed, Well nourished, Uncomfortable appearing; Head:  Normocephalic, atraumatic; Eyes: EOMI, PERRL, No scleral icterus; ENMT: Mouth and pharynx normal, Mucous membranes dry; Neck: Supple, Full range of motion, No lymphadenopathy; Cardiovascular: Regular rate and rhythm, No gallop; Respiratory: Breath sounds clear & equal bilaterally, No rales, rhonchi, wheezes.  Speaking full sentences with ease, Normal respiratory effort/excursion; Chest: Nontender, Movement normal; Abdomen: Soft, +generalized TTP, distended. Normal bowel sounds; Genitourinary: No CVA tenderness; Extremities: Pulses normal, No tenderness, No edema, No calf edema or asymmetry.; Neuro: AA&Ox3, Major CN grossly intact.  Speech clear. No gross focal motor or sensory deficits in extremities.; Skin: Color normal, Warm, Dry.   ED Course  Procedures    MDM  MDM Reviewed: nursing note, vitals and previous chart Reviewed previous: labs Interpretation: labs, x-ray and CT scan   Results for orders placed during the hospital encounter of 07/07/12  CBC WITH DIFFERENTIAL      Component Value Range   WBC 5.6  4.0 - 10.5 K/uL   RBC 3.77 (*) 3.87 - 5.11 MIL/uL   Hemoglobin 10.1 (*) 12.0 - 15.0 g/dL   HCT 40.9 (*) 81.1 - 91.4 %   MCV 82.0  78.0 - 100.0 fL   MCH 26.8  26.0 - 34.0 pg   MCHC 32.7   30.0 - 36.0 g/dL   RDW 78.2 (*) 95.6 - 21.3 %   Platelets 388  150 -  400 K/uL   Neutrophils Relative 73  43 - 77 %   Neutro Abs 4.1  1.7 - 7.7 K/uL   Lymphocytes Relative 22  12 - 46 %   Lymphs Abs 1.2  0.7 - 4.0 K/uL   Monocytes Relative 5  3 - 12 %   Monocytes Absolute 0.3  0.1 - 1.0 K/uL   Eosinophils Relative 1  0 - 5 %   Eosinophils Absolute 0.0  0.0 - 0.7 K/uL   Basophils Relative 0  0 - 1 %   Basophils Absolute 0.0  0.0 - 0.1 K/uL  COMPREHENSIVE METABOLIC PANEL      Component Value Range   Sodium 140  135 - 145 mEq/L   Potassium 2.9 (*) 3.5 - 5.1 mEq/L   Chloride 100  96 - 112 mEq/L   CO2 25  19 - 32 mEq/L   Glucose, Bld 88  70 - 99 mg/dL   BUN 5 (*) 6 - 23 mg/dL   Creatinine, Ser 1.61  0.50 - 1.10 mg/dL   Calcium 9.2  8.4 - 09.6 mg/dL   Total Protein 7.6  6.0 - 8.3 g/dL   Albumin 3.4 (*) 3.5 - 5.2 g/dL   AST 34  0 - 37 U/L   ALT 9  0 - 35 U/L   Alkaline Phosphatase 358 (*) 39 - 117 U/L   Total Bilirubin 0.5  0.3 - 1.2 mg/dL   GFR calc non Af Amer >90  >90 mL/min   GFR calc Af Amer >90  >90 mL/min  LIPASE, BLOOD      Component Value Range   Lipase 12  11 - 59 U/L    Ct Abdomen Pelvis W Contrast 07/07/2012  *RADIOLOGY REPORT*  Clinical Data: Abdominal pain, nausea and vomiting; history of metastatic breast cancer with bone and brain metastases  CT ABDOMEN AND PELVIS WITH CONTRAST  Technique:  Multidetector CT imaging of the abdomen and pelvis was performed following the standard protocol during bolus administration of intravenous contrast.  Contrast: 50mL OMNIPAQUE IOHEXOL 300 MG/ML  SOLN, 80mL OMNIPAQUE IOHEXOL 300 MG/ML  SOLN  Comparison: CT abdomen pelvis - 04/20/2012  Findings:  Limited visualization of lower thorax is made of focal airspace opacity or pleural effusion.  Normal heart size.  No pericardial effusion.  Continued progression of extensive hepatic metastatic disease with dominant mass within the dome of the right lobe the liver now measuring approximately 10.8  x 9.7 cm of image 15, series 2, previously, 9.2 x 8.5 cm.  Near the entirety of the left lobe is expanded and replaced by metastatic disease with definable partially exophytic nodule arising from the inferior aspect of the medial segment of the left lobe of the liver now measuring approximately 5.4 x 6.2 cm (image 42) previously, 4.1 x 5.1 cm. Hepatomegaly again results in mass effect upon the adjacent organs. The portal vein remains patent.  No ascites.  There is symmetric enhancement and excretion of the bilateral kidneys.  Suspected punctate (110 mm) nonobstructing stone within the superior pole left kidney within the 35, series 2).  No definite right-sided renal stones.  No urinary obstruction or discrete renal lesion.  There is mild diffuse thickening of the left adrenal gland without discrete nodule.  Normal appearance of the right adrenal gland.  Normal appearance of the pancreas and spleen.  Incidental note is made of a small splenule.  Moderate to large colonic stool burden without evidence of obstruction.  No pneumoperitoneum, pneumatosis or portal venous  gas.  Scattered atherosclerotic calcifications within a normal caliber abdominal aorta.  The major branch vessels of the abdominal aorta are patent on this non CT examination.  No definite retroperitoneal, mesenteric, pelvic or inguinal lymphadenopathy the evaluation of the pelvis is degraded secondary to streak artifact from the right hemiarthroplasty.  Multiple partially calcified uterine fibroids.  No discrete adnexal lesion.  No free fluid in the pelvis.  Grossly unchanged extensive osseous metastatic disease throughout the imaged thoracolumbar spine with dense sclerotic metastases involving the T9, T10 and T11 vertebral bodies most conspicuously. Post decompressive laminectomies at T9 - T11. Epidural extension of disease is suspected as posterior to the L3 vertebral body (axial image 41, series 2 as well as posterior to the L1 vertebral body (axial  image 28, series 2).  Likely interval progression of mixed lytic sclerotic lesion within the left ilium, now measuring 2.6 x 1.7 cm (image 59, series 2, previously, approximately 2.3 x 1.7 cm. Post right hip hemiarthroplasty.  No definite pathologic fractures.  IMPRESSION: 1.  Continued progression of extensive hepatic metastatic disease with resultant increased hepatomegaly resulting in mass effect upon the adjacent organs. 2.  Moderate to large colonic stool burden without evidence of obstruction. 3.  Grossly unchanged extensive osseous metastatic disease within the thoracolumbar spine with likely unchanged epidural involvement posterior to the L1 and L3 vertebral bodies.  4.  Suspected progression of mixed lytic/sclerotic lesion within the left ilium.   Original Report Authenticated By: Tacey Ruiz, MD    Dg Abd Acute W/chest 07/07/2012  *RADIOLOGY REPORT*  Clinical Data: Abdominal pain, nausea and vomiting, evaluate for small bowel obstruction and free air  ACUTE ABDOMEN SERIES (ABDOMEN 2 VIEW & CHEST 1 VIEW)  Comparison: 06/15/2012; chest radiograph - 02/15/2012; CT abdomen pelvis - 04/20/2012  Findings:  Grossly unchanged cardiac silhouette and mediastinal contours with mild tortuosity of the thoracic aorta.  No focal parenchymal opacities.  There is persistent mild elevation of the right hemidiaphragm.  No pleural effusion or pneumothorax.  Moderate to large colonic stool burden without evidence of obstruction.  No pneumoperitoneum, pneumatosis or portal venous gas.  Post right hip hemiarthroplasty.  Surgical clips overlie the right axilla. Post right-sided mastectomy.  IMPRESSION: 1.  No acute cardiopulmonary disease. 2.  Moderate to large colonic stool burden without evidence of obstruction.  No pneumoperitoneum.   Original Report Authenticated By: Tacey Ruiz, MD     Results for BOWEN, GOYAL (MRN [id number]) as of 07/07/2012 21:33  Ref. Range 05/24/2012 15:15 06/09/2012 12:36 06/15/2012 15:40  06/21/2012 11:23 07/07/2012 17:40  Hemoglobin Latest Range: 12.0-15.0 g/dL 40.9 (L) 81.1 (L) 91.4 (L) 10.1 (L) 10.1 (L)  HCT Latest Range: 36.0-46.0 % 32.2 (L) 32.2 (L) 32.9 (L) 30.3 (L) 30.9 (L)      2120:  Pt continues to c/o pain and nausea, though appears more comfortable than on arrival to ED.  H/H per baseline.  Potassium repleted IV and PO.  CT A/P with worsening mets.  Dx and testing d/w pt and family.  Questions answered.  Verb understanding, agreeable to admit.  T/C to [facility]. Arthor Captain, case discussed, including:  HPI, pertinent PM/SHx, VS/PE, dx testing, ED course and treatment:  Agreeable to admit, requests to obtain medical bed on Heme/Onc floor to team 8.       Laray Anger, DO 07/08/12 2159

## 2012-07-07 NOTE — H&P (Signed)
[facility] History and Physical  [name] ZOX:[id number] DOB: 05-14-1966 DOA: 07/07/2012  Referring physician: Laray Anger, DO PCP: No primary provider on file.  Specialists: Gus Magrinat  Chief Complaint: Acute abdominal pain  HPI: [name] is a 47 y.o. 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 Patient calls with the regional cancer Center she was seen on 06/21/2012. Recommendation at that time to continue her exemestane, everolimus and Zoladex, she was also complaining about pain so she was started on 20 mg of MS Contin twice a day and Percocet. Patient came in to the emergency department today complaining about severe abdominal pain. She said the pain is been building up for the past several days, she denies having bowel movement apart from small smear this morning. Initial evaluation in the emergency department with abdominal CT scan showed extensive hepatic metastasis with progression since last study, patient has large stool burden. Patient will be admitted to the hospital for the management of the pain as well as the constipation.  Review of Systems:  Constitutional: negative for anorexia, fevers and sweats Eyes: negative for irritation, redness and visual disturbance Ears, nose, mouth, throat, and face: negative for earaches, epistaxis, nasal congestion and sore throat Respiratory: negative for cough, dyspnea on exertion, sputum and wheezing Cardiovascular: negative for chest pain, dyspnea, lower extremity edema, orthopnea, palpitations and syncope Gastrointestinal: negative for abdominal pain, constipation, diarrhea, melena, nausea and vomiting Genitourinary:negative for dysuria, frequency and hematuria Hematologic/lymphatic: negative for bleeding, easy bruising and lymphadenopathy Musculoskeletal:negative for arthralgias, muscle weakness and stiff joints Neurological: negative for  coordination problems, gait problems, headaches and weakness Endocrine: negative for diabetic symptoms including polydipsia, polyuria and weight loss Allergic/Immunologic: negative for anaphylaxis, hay fever and urticaria    Past Medical History  Diagnosis Date  . Hypertension   . GERD (gastroesophageal reflux disease)   . 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 Pyelonephritis 10/01/11  . Recurrent UTI (urinary tract infection) 10/01/11  . S/P chemotherapy, time since 4-12 weeks   . S/P radiation > 12 weeks   . 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 Brain cancer     spread from breast ca  . Hypercalcemia 06/21/2012   Past Surgical History  Procedure Date  . Cesarean section 1989; 2010  . Mastectomy 2011    right  . Back surgery 2012    "removed tumor,cancer, from my spine  . Port-a-cath removal 2012    right chest  . Portacath placement 2011    right  . Tubal ligation 2010  . Breast biopsy 2011    right  . Hip arthroplasty 05/04/2012    Procedure: ARTHROPLASTY BIPOLAR HIP;  Surgeon: Shelda Pal, MD;  Location: [facility];  Service: Orthopedics;  Laterality: Right;  RIGHT HIP HEMI ARTHROPLASTY    Social History:  reports that she quit smoking about 2 months ago. Her smoking use included Cigarettes. She has a 7.75 pack-year smoking history. She has never used smokeless tobacco. She reports that she does not drink alcohol or use illicit drugs.  No Known Allergies  Family History  Problem Relation Age of Onset  . Cancer Brother 65    died of colon cancer at age of 33  . High blood pressure Mother    Prior to Admission medications   Medication Sig Start Date End Date Taking? Authorizing Provider  amLODipine (  NORVASC) 5 MG tablet Take 5 mg by mouth daily before breakfast.   Yes Historical Provider, MD  everolimus (AFINITOR) 5 MG tablet Take 5 mg by mouth daily. 06/09/12  Yes Amy Allegra Grana, PA  exemestane (AROMASIN) 25 MG tablet Take 25 mg by  mouth daily after breakfast. 05/03/12  Yes Lowella Dell, MD  ferrous sulfate 325 (65 FE) MG tablet Take 325 mg by mouth 3 (three) times daily after meals. 05/06/12  Yes Genelle Gather Babish, PA  methocarbamol (ROBAXIN) 500 MG tablet Take 500 mg by mouth every 6 (six) hours as needed. Muscle spasms 05/06/12  Yes Genelle Gather [place], PA  Multiple Vitamin (MULTIVITAMIN WITH MINERALS) TABS Take 1 tablet by mouth daily.   Yes Historical Provider, MD  ondansetron (ZOFRAN) 4 MG tablet Take 4 mg by mouth every 6 (six) hours. 06/15/12  Yes Roxy Horseman, PA-C  oxyCODONE (OXYCONTIN) 20 MG 12 hr tablet Take 20 mg by mouth every 12 (twelve) hours. 06/21/12  Yes Amy Allegra Grana, PA  oxyCODONE-acetaminophen (PERCOCET) 10-325 MG per tablet Take 1-2 tablets by mouth every 6 (six) hours as needed. For pain.  Maximum of 8 tablets in 24 hours. 06/30/12  Yes Lowella Dell, MD  potassium chloride SA (K-DUR,KLOR-CON) 20 MEQ tablet Take 20 mEq by mouth daily. 05/25/12  Yes Si Gaul, MD  promethazine (PHENERGAN) 12.5 MG tablet Take 12.5-25 mg by mouth every 6 (six) hours as needed. For nausea.   Yes Historical Provider, MD   Physical Exam: Filed Vitals:   07/07/12 1647 07/07/12 1653 07/07/12 1845 07/07/12 2149  BP:  157/71 153/82 160/80  Pulse:  98 98   Temp:  98.7 F (37.1 C) 98.5 F (36.9 C)   TempSrc:  Oral Oral   Resp:  18 18 14   SpO2: 98% 100% 98% 100%   General appearance: alert, cooperative and no distress  Head: Normocephalic, without obvious abnormality, atraumatic  Eyes: conjunctivae/corneas clear. PERRL, EOM's intact. Fundi benign.  Nose: Nares normal. Septum midline. Mucosa normal. No drainage or sinus tenderness.  Throat: lips, mucosa, and tongue normal; teeth and gums normal  Neck: Supple, no masses, no cervical lymphadenopathy, no JVD appreciated, no meningeal signs Resp: clear to auscultation bilaterally  Chest wall: no tenderness  Cardio: regular rate and rhythm, S1, S2 normal, no  murmur, click, rub or gallop  GI: Nontender abdomen, especially RUQ. Extremities: extremities normal, atraumatic, no cyanosis or edema  Skin: Skin color, texture, turgor normal. No rashes or lesions  Neurologic: Alert and oriented X 3, normal strength and tone. Normal symmetric reflexes. Normal coordination and gait  Labs on Admission:  Basic Metabolic Panel:  Lab 07/07/12 1610  NA 140  K 2.9*  CL 100  CO2 25  GLUCOSE 88  BUN 5*  CREATININE 0.70  CALCIUM 9.2  MG --  PHOS --   Liver Function Tests:  Lab 07/07/12 1740  AST 34  ALT 9  ALKPHOS 358*  BILITOT 0.5  PROT 7.6  ALBUMIN 3.4*    Lab 07/07/12 1740  LIPASE 12  AMYLASE --   No results found for this basename: AMMONIA:5 in the last 168 hours CBC:  Lab 07/07/12 1740  WBC 5.6  NEUTROABS 4.1  HGB 10.1*  HCT 30.9*  MCV 82.0  PLT 388   Cardiac Enzymes: No results found for this basename: CKTOTAL:5,CKMB:5,CKMBINDEX:5,TROPONINI:5 in the last 168 hours  BNP (last 3 results) No results found for this basename: PROBNP:3 in the last 8760 hours CBG: No results found  for this basename: GLUCAP:5 in the last 168 hours  Radiological Exams on Admission: Ct Abdomen Pelvis W Contrast  07/07/2012  *RADIOLOGY REPORT*  Clinical Data: Abdominal pain, nausea and vomiting; history of metastatic breast cancer with bone and brain metastases  CT ABDOMEN AND PELVIS WITH CONTRAST  Technique:  Multidetector CT imaging of the abdomen and pelvis was performed following the standard protocol during bolus administration of intravenous contrast.  Contrast: 50mL OMNIPAQUE IOHEXOL 300 MG/ML  SOLN, 80mL OMNIPAQUE IOHEXOL 300 MG/ML  SOLN  Comparison: CT abdomen pelvis - 04/20/2012  Findings:  Limited visualization of lower thorax is made of focal airspace opacity or pleural effusion.  Normal heart size.  No pericardial effusion.  Continued progression of extensive hepatic metastatic disease with dominant mass within the dome of the right lobe the  liver now measuring approximately 10.8 x 9.7 cm of image 15, series 2, previously, 9.2 x 8.5 cm.  Near the entirety of the left lobe is expanded and replaced by metastatic disease with definable partially exophytic nodule arising from the inferior aspect of the medial segment of the left lobe of the liver now measuring approximately 5.4 x 6.2 cm (image 42) previously, 4.1 x 5.1 cm. Hepatomegaly again results in mass effect upon the adjacent organs. The portal vein remains patent.  No ascites.  There is symmetric enhancement and excretion of the bilateral kidneys.  Suspected punctate (110 mm) nonobstructing stone within the superior pole left kidney within the 35, series 2).  No definite right-sided renal stones.  No urinary obstruction or discrete renal lesion.  There is mild diffuse thickening of the left adrenal gland without discrete nodule.  Normal appearance of the right adrenal gland.  Normal appearance of the pancreas and spleen.  Incidental note is made of a small splenule.  Moderate to large colonic stool burden without evidence of obstruction.  No pneumoperitoneum, pneumatosis or portal venous gas.  Scattered atherosclerotic calcifications within a normal caliber abdominal aorta.  The major branch vessels of the abdominal aorta are patent on this non CT examination.  No definite retroperitoneal, mesenteric, pelvic or inguinal lymphadenopathy the evaluation of the pelvis is degraded secondary to streak artifact from the right hemiarthroplasty.  Multiple partially calcified uterine fibroids.  No discrete adnexal lesion.  No free fluid in the pelvis.  Grossly unchanged extensive osseous metastatic disease throughout the imaged thoracolumbar spine with dense sclerotic metastases involving the T9, T10 and T11 vertebral bodies most conspicuously. Post decompressive laminectomies at T9 - T11. Epidural extension of disease is suspected as posterior to the L3 vertebral body (axial image 41, series 2 as well as  posterior to the L1 vertebral body (axial image 28, series 2).  Likely interval progression of mixed lytic sclerotic lesion within the left ilium, now measuring 2.6 x 1.7 cm (image 59, series 2, previously, approximately 2.3 x 1.7 cm. Post right hip hemiarthroplasty.  No definite pathologic fractures.  IMPRESSION: 1.  Continued progression of extensive hepatic metastatic disease with resultant increased hepatomegaly resulting in mass effect upon the adjacent organs. 2.  Moderate to large colonic stool burden without evidence of obstruction. 3.  Grossly unchanged extensive osseous metastatic disease within the thoracolumbar spine with likely unchanged epidural involvement posterior to the L1 and L3 vertebral bodies.  4.  Suspected progression of mixed lytic/sclerotic lesion within the left ilium.   Original Report Authenticated By: Tacey Ruiz, MD    Dg Abd Acute W/chest  07/07/2012  *RADIOLOGY REPORT*  Clinical Data: Abdominal pain, nausea  and vomiting, evaluate for small bowel obstruction and free air  ACUTE ABDOMEN SERIES (ABDOMEN 2 VIEW & CHEST 1 VIEW)  Comparison: 06/15/2012; chest radiograph - 02/15/2012; CT abdomen pelvis - 04/20/2012  Findings:  Grossly unchanged cardiac silhouette and mediastinal contours with mild tortuosity of the thoracic aorta.  No focal parenchymal opacities.  There is persistent mild elevation of the right hemidiaphragm.  No pleural effusion or pneumothorax.  Moderate to large colonic stool burden without evidence of obstruction.  No pneumoperitoneum, pneumatosis or portal venous gas.  Post right hip hemiarthroplasty.  Surgical clips overlie the right axilla. Post right-sided mastectomy.  IMPRESSION: 1.  No acute cardiopulmonary disease. 2.  Moderate to large colonic stool burden without evidence of obstruction.  No pneumoperitoneum.   Original Report Authenticated By: Tacey Ruiz, MD     EKG: Independently reviewed.   Assessment/Plan Principal Problem:  *Acute abdominal  pain Active Problems:  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  Bone metastases  Malignant neoplasm of breast (female), unspecified site  Constipation    Acute abdominal pain -This is likely multifactorial secondary to her constipation and metastatic breast cancer. -Her liver metastases extending her hepatic capsule probably causing her severe pain. -Patient was started recently on MS Contin and Percocet for that. -I will continue her home regimen, add IV morphine while she is in the hospital.  Constipation -CT scan of abdomen and pelvis showed large stool burden, likely secondary to oral narcotics. -I will start her on bowel regimen, give soap suds enema, repeat if it is ineffective. -Patient needs bowel regimen on discharge.  Stage IV breast cancer -With metastasis to the bone and liver, she had history of right hip pathological fracture status post hemiarthroplasty. -Patient follows with Dr. Darnelle Catalan of the regional cancer [facility].  Hypokalemia -Replete with oral supplements.  Hypercalcemia -Secondary to bone metastases, per oncology notes patient start on Xgeva. -The calcium is normal today.  Code Status: Full code Family Communication: Discussed with the patient and her daughter at bedside. Disposition Plan: Observation  Time spent: 60 minutes  [facility] Pager [phone]  If 7PM-7AM, please contact night-coverage [email or web address] Password [facility] 07/07/2012, 10:19 PM

## 2012-07-07 NOTE — ED Notes (Signed)
Patient back from x-ray, informed patient we needed a urine, she said that "she just peed, that they took her on the way back from x-ray."RN [place] notified.

## 2012-07-07 NOTE — ED Notes (Signed)
Patient refusing to provide urine specimen.

## 2012-07-08 DIAGNOSIS — G893 Neoplasm related pain (acute) (chronic): Secondary | ICD-10-CM

## 2012-07-08 DIAGNOSIS — C7952 Secondary malignant neoplasm of bone marrow: Secondary | ICD-10-CM

## 2012-07-08 DIAGNOSIS — I10 Essential (primary) hypertension: Secondary | ICD-10-CM

## 2012-07-08 DIAGNOSIS — R52 Pain, unspecified: Secondary | ICD-10-CM

## 2012-07-08 LAB — CBC
MCH: 26.9 pg (ref 26.0–34.0)
MCHC: 32.6 g/dL (ref 30.0–36.0)
RDW: 18.9 % — ABNORMAL HIGH (ref 11.5–15.5)

## 2012-07-08 LAB — BASIC METABOLIC PANEL
BUN: 5 mg/dL — ABNORMAL LOW (ref 6–23)
Creatinine, Ser: 0.66 mg/dL (ref 0.50–1.10)
GFR calc Af Amer: >90 mL/min (ref >90)
GFR calc non Af Amer: >90 mL/min (ref >90)
Glucose, Bld: 97 mg/dL (ref 70–99)

## 2012-07-08 MED ORDER — MILK AND MOLASSES ENEMA
Freq: Once | RECTAL | Status: AC
  Administered 2012-07-08: 15:00:00 via RECTAL
  Filled 2012-07-08: qty 250

## 2012-07-08 NOTE — Progress Notes (Signed)
[name]   DOB:12-05-1965   WG#:[id number]   VHQ#:[id number]  Subjective: [name] tells me her pain yesterday morning was "terrible." She had spent the day before mostly in bed. Currently she is more comfortable, eating breakfast. Denies nausea or vomiting. No BM yet. No family in room   Objective: middle-aged [place] American woman examined in bed Filed Vitals:   07/08/12 0510  BP: 124/79  Pulse: 89  Temp: 98.2 F (36.8 C)  Resp: 16    There is no height on file to calculate BMI.  Intake/Output Summary (Last 24 hours) at 07/08/12 0811 Last data filed at 07/08/12 0600  Gross per 24 hour  Intake    450 ml  Output      0 ml  Net    450 ml     Sclerae unicteric  Oropharynx clear  No peripheral adenopathy  Lungs clear -- no rales or rhonchi, auscultated anteriorly  Heart regular rate and rhythm  Abdomen distended, non-tender, +BS  MSK no focal spinal tenderness, no peripheral edema  Neuro nonfocal, well-oriented  Breast exam: deferred  CBG (last 3)  No results found for this basename: GLUCAP:3 in the last 72 hours   Labs:  Lab Results  Component Value Date   WBC 4.5 07/08/2012   HGB 9.1* 07/08/2012   HCT 27.9* 07/08/2012   MCV 82.5 07/08/2012   PLT 336 07/08/2012   NEUTROABS 4.1 07/07/2012    @LASTCHEMISTRY @  Urine Studies No results found for this basename: UACOL:2,UAPR:2,USPG:2,UPH:2,UTP:2,UGL:2,UKET:2,UBIL:2,UHGB:2,UNIT:2,UROB:2,ULEU:2,UEPI:2,UWBC:2,URBC:2,UBAC:2,CAST:2,CRYS:2,UCOM:2,BILUA:2 in the last 72 hours  Basic Metabolic Panel:  Lab 07/08/12 4132 07/07/12 1740  NA 140 140  K 3.7 2.9*  CL 103 100  CO2 25 25  GLUCOSE 97 88  BUN 5* 5*  CREATININE 0.66 0.70  CALCIUM 8.8 9.2  MG -- --  PHOS -- --   GFR The CrCl is unknown because both a height and weight (above a minimum accepted value) are required for this calculation. Liver Function Tests:  Lab 07/07/12 1740  AST 34  ALT 9  ALKPHOS 358*  BILITOT 0.5  PROT 7.6  ALBUMIN 3.4*    Lab 07/07/12  1740  LIPASE 12  AMYLASE --   No results found for this basename: AMMONIA:5 in the last 168 hours Coagulation profile No results found for this basename: INR:5,PROTIME:5 in the last 168 hours  CBC:  Lab 07/08/12 0454 07/07/12 1740  WBC 4.5 5.6  NEUTROABS -- 4.1  HGB 9.1* 10.1*  HCT 27.9* 30.9*  MCV 82.5 82.0  PLT 336 388   Cardiac Enzymes: No results found for this basename: CKTOTAL:5,CKMB:5,CKMBINDEX:5,TROPONINI:5 in the last 168 hours BNP: No components found with this basename: POCBNP:5 CBG: No results found for this basename: GLUCAP:5 in the last 168 hours D-Dimer No results found for this basename: DDIMER:2 in the last 72 hours Hgb A1c No results found for this basename: HGBA1C:2 in the last 72 hours Lipid Profile No results found for this basename: CHOL:2,HDL:2,LDLCALC:2,TRIG:2,CHOLHDL:2,LDLDIRECT:2 in the last 72 hours Thyroid function studies No results found for this basename: TSH,T4TOTAL,FREET3,T3FREE,THYROIDAB in the last 72 hours Anemia work up No results found for this basename: VITAMINB12:2,FOLATE:2,FERRITIN:2,TIBC:2,IRON:2,RETICCTPCT:2 in the last 72 hours Microbiology No results found for this or any previous visit (from the past 240 hour(s)).    Studies:  Ct Abdomen Pelvis W Contrast  07/07/2012  *RADIOLOGY REPORT*  Clinical Data: Abdominal pain, nausea and vomiting; history of metastatic breast cancer with bone and brain metastases  CT ABDOMEN AND PELVIS WITH CONTRAST  Technique:  Multidetector CT imaging of the abdomen and pelvis was performed following the standard protocol during bolus administration of intravenous contrast.  Contrast: 50mL OMNIPAQUE IOHEXOL 300 MG/ML  SOLN, 80mL OMNIPAQUE IOHEXOL 300 MG/ML  SOLN  Comparison: CT abdomen pelvis - 04/20/2012  Findings:  Limited visualization of lower thorax is made of focal airspace opacity or pleural effusion.  Normal heart size.  No pericardial effusion.  Continued progression of extensive hepatic  metastatic disease with dominant mass within the dome of the right lobe the liver now measuring approximately 10.8 x 9.7 cm of image 15, series 2, previously, 9.2 x 8.5 cm.  Near the entirety of the left lobe is expanded and replaced by metastatic disease with definable partially exophytic nodule arising from the inferior aspect of the medial segment of the left lobe of the liver now measuring approximately 5.4 x 6.2 cm (image 42) previously, 4.1 x 5.1 cm. Hepatomegaly again results in mass effect upon the adjacent organs. The portal vein remains patent.  No ascites.  There is symmetric enhancement and excretion of the bilateral kidneys.  Suspected punctate (110 mm) nonobstructing stone within the superior pole left kidney within the 35, series 2).  No definite right-sided renal stones.  No urinary obstruction or discrete renal lesion.  There is mild diffuse thickening of the left adrenal gland without discrete nodule.  Normal appearance of the right adrenal gland.  Normal appearance of the pancreas and spleen.  Incidental note is made of a small splenule.  Moderate to large colonic stool burden without evidence of obstruction.  No pneumoperitoneum, pneumatosis or portal venous gas.  Scattered atherosclerotic calcifications within a normal caliber abdominal aorta.  The major branch vessels of the abdominal aorta are patent on this non CT examination.  No definite retroperitoneal, mesenteric, pelvic or inguinal lymphadenopathy the evaluation of the pelvis is degraded secondary to streak artifact from the right hemiarthroplasty.  Multiple partially calcified uterine fibroids.  No discrete adnexal lesion.  No free fluid in the pelvis.  Grossly unchanged extensive osseous metastatic disease throughout the imaged thoracolumbar spine with dense sclerotic metastases involving the T9, T10 and T11 vertebral bodies most conspicuously. Post decompressive laminectomies at T9 - T11. Epidural extension of disease is suspected as  posterior to the L3 vertebral body (axial image 41, series 2 as well as posterior to the L1 vertebral body (axial image 28, series 2).  Likely interval progression of mixed lytic sclerotic lesion within the left ilium, now measuring 2.6 x 1.7 cm (image 59, series 2, previously, approximately 2.3 x 1.7 cm. Post right hip hemiarthroplasty.  No definite pathologic fractures.  IMPRESSION: 1.  Continued progression of extensive hepatic metastatic disease with resultant increased hepatomegaly resulting in mass effect upon the adjacent organs. 2.  Moderate to large colonic stool burden without evidence of obstruction. 3.  Grossly unchanged extensive osseous metastatic disease within the thoracolumbar spine with likely unchanged epidural involvement posterior to the L1 and L3 vertebral bodies.  4.  Suspected progression of mixed lytic/sclerotic lesion within the left ilium.   Original Report Authenticated By: Tacey Ruiz, MD    Dg Abd Acute W/chest  07/07/2012  *RADIOLOGY REPORT*  Clinical Data: Abdominal pain, nausea and vomiting, evaluate for small bowel obstruction and free air  ACUTE ABDOMEN SERIES (ABDOMEN 2 VIEW & CHEST 1 VIEW)  Comparison: 06/15/2012; chest radiograph - 02/15/2012; CT abdomen pelvis - 04/20/2012  Findings:  Grossly unchanged cardiac silhouette and mediastinal contours with mild tortuosity of the thoracic aorta.  No  focal parenchymal opacities.  There is persistent mild elevation of the right hemidiaphragm.  No pleural effusion or pneumothorax.  Moderate to large colonic stool burden without evidence of obstruction.  No pneumoperitoneum, pneumatosis or portal venous gas.  Post right hip hemiarthroplasty.  Surgical clips overlie the right axilla. Post right-sided mastectomy.  IMPRESSION: 1.  No acute cardiopulmonary disease. 2.  Moderate to large colonic stool burden without evidence of obstruction.  No pneumoperitoneum.   Original Report Authenticated By: Tacey Ruiz, MD     Assessment: 47 y.o.  [place] woman admitted with uncontrolled pain, constipation  1. 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  2.Status post adjuvant chemotherapy July to November 2011, consisting of 4 cycles of dose dense doxorubicin and cyclophosphamide, followed by 4 of 12 planned weekly doses of paclitaxel given adjuvantly, discontinued secondary to peripheral neuropathy.  3. Status post radiation to the right chest wall, truncated due to problems with the patient missing appointments.  4. Tamoxifen started June of 2012, but discontinued due to nausea.  5. metastatic disease to liver and bone pathologically documented October of 2012, estrogen receptor 100% positive, progesterone receptor 7% positive, with no HER-2 amplification; treatment in the metastatic setting has consisted of:  6.Faslodex started 03/16/2011, discontinued November 2013 with progression. Zoladex every 3 months also started on 03/16/2011, and monthly zoledronic acid started November 2011, the Zometa I currently held due to pending dental procedures and possible extractions.  7. Status post thoracic laminectomy, T9-T11, for impending cord compression on 03/21/2011, followed by irradiation to the spine completed November 2011  8. 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 s/p palliative radiation to the Right hip clivus to 30 Gy completed 04/07/2012  10. Progression noted by scans in November 2013, at which time Faslodex was discontinued. Patient was started on exemestane in early December, with everolimus started in mid December at 5 mg daily. Continues to receive Zoladex injections every 3 months.  11. Nondisplaced right femoral neck fracture, status  post right hip arthroplasty on 05/04/2012 [facility].  12. Hypercalcemia with elevated serum creatinine   Plan: Terryn has had several admissions through the ED in the past weeks. Her pain is frequently uncontrolled and she has missed appointments because of lack of transportation. I think it would be helpful for her to be enrolled in Hospice, but this would require a DNR order. I discussed her prognosis with her--<6 months--and my opinion that CPR/ life support in her case would be futile. She is "going to think abut it," and talk it over with family.  If patient agrees to DNR, a Hospice referral should be placed before d/c. 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  I wrote for an enema this AM. Will follow- with you this weekend as I am on call SAT   MAGRINAT,GUSTAV C 07/08/2012

## 2012-07-08 NOTE — Progress Notes (Signed)
Pt admitted from the ED with abdominal pain. Pt ambulated from bed to the stretcher. Pt orientated to the unit and was given and educated on the fall prevention plan. Pt's vital signs stable. Pt rated pain 8 out of 10, pt given pain medication per MD order. Pt alert and orientated x4 family at bedside. Pt and family had no questions or concerns at this time.

## 2012-07-08 NOTE — Progress Notes (Signed)
[facility] PROGRESS NOTE  [name] GNF:[id number] DOB: 04/19/1966 DOA: 07/07/2012 PCP: No primary provider on file.  Assessment/Plan: Acute abdominal pain  -This is likely multifactorial secondary to [name] constipation and metastatic breast cancer.  -[name] liver metastases extending [name] hepatic capsule probably causing [name] pain.  -Patient was started recently on MS Contin and Percocet for that.  -I will continue [name] home regimen, add IV morphine while she is in the hospital.  - Will reevaluate after patient has BM  Constipation  -CT scan of abdomen and pelvis showed large stool burden, likely secondary to oral narcotics.  -I will continue [name] on current bowel regimen, give soap suds enema, repeat if it is ineffective.  -Reportedly patient had some soft stools today. But pt still complaining of some abdominal discomfort.  Stage IV breast cancer  -With metastasis to the bone and liver, she had history of right hip pathological fracture status post hemiarthroplasty.  -Patient [facility]   Hypokalemia  -Resolved after oral repletion  Hypercalcemia  -Secondary to bone metastases, per oncology notes patient start on Xgeva.  -The calcium is normal today.   Code Status: full code Family Communication: no family at bedside Disposition Plan: pending improvement in abdominal discomfort.    Consultants:  none  Procedures:  none  Antibiotics:  none  HPI/Subjective: Patient feels about the same today.  She mentioned that she had not had a bowel movement in ~ 1 week.  Has been feeling like she has been wanting to have a BM.  No acute issues reported overnight.  Objective: Filed Vitals:   07/07/12 2149 07/07/12 2307 07/08/12 0510 07/08/12 1452  BP: 160/80 152/84 124/79 113/71  Pulse:  92 89 89  Temp:  98.9 F (37.2 C) 98.2 F (36.8 C) 97.5 F (36.4 C)  TempSrc:  Oral Oral Oral  Resp: 14 16 16 18   Weight:  57.4 kg (126  lb 8.7 oz)    SpO2: 100% 100% 100% 98%    Intake/Output Summary (Last 24 hours) at 07/08/12 1458 Last data filed at 07/08/12 1300  Gross per 24 hour  Intake   1410 ml  Output      0 ml  Net   1410 ml   Filed Weights   07/07/12 2307  Weight: 57.4 kg (126 lb 8.7 oz)    Exam:   General:  Pt in NAD, Alert and Awake  Cardiovascular: RRR, No MRG  Respiratory: CTA BL, no wheezes  Abdomen: soft, NT, ND  Data Reviewed: Basic Metabolic Panel:  Lab 07/08/12 8469 07/07/12 1740  NA 140 140  K 3.7 2.9*  CL 103 100  CO2 25 25  GLUCOSE 97 88  BUN 5* 5*  CREATININE 0.66 0.70  CALCIUM 8.8 9.2  MG -- --  PHOS -- --   Liver Function Tests:  Lab 07/07/12 1740  AST 34  ALT 9  ALKPHOS 358*  BILITOT 0.5  PROT 7.6  ALBUMIN 3.4*    Lab 07/07/12 1740  LIPASE 12  AMYLASE --   No results found for this basename: AMMONIA:5 in the last 168 hours CBC:  Lab 07/08/12 0454 07/07/12 1740  WBC 4.5 5.6  NEUTROABS -- 4.1  HGB 9.1* 10.1*  HCT 27.9* 30.9*  MCV 82.5 82.0  PLT 336 388   Cardiac Enzymes: No results found for this basename: CKTOTAL:5,CKMB:5,CKMBINDEX:5,TROPONINI:5 in the last 168 hours BNP (last 3 results) No results found for this basename: PROBNP:3 in the  last 8760 hours CBG: No results found for this basename: GLUCAP:5 in the last 168 hours  No results found for this or any previous visit (from the past 240 hour(s)).   Studies: Ct Abdomen Pelvis W Contrast  07/07/2012  *RADIOLOGY REPORT*  Clinical Data: Abdominal pain, nausea and vomiting; history of metastatic breast cancer with bone and brain metastases  CT ABDOMEN AND PELVIS WITH CONTRAST  Technique:  Multidetector CT imaging of the abdomen and pelvis was performed following the standard protocol during bolus administration of intravenous contrast.  Contrast: 50mL OMNIPAQUE IOHEXOL 300 MG/ML  SOLN, 80mL OMNIPAQUE IOHEXOL 300 MG/ML  SOLN  Comparison: CT abdomen pelvis - 04/20/2012  Findings:  Limited  visualization of lower thorax is made of focal airspace opacity or pleural effusion.  Normal heart size.  No pericardial effusion.  Continued progression of extensive hepatic metastatic disease with dominant mass within the dome of the right lobe the liver now measuring approximately 10.8 x 9.7 cm of image 15, series 2, previously, 9.2 x 8.5 cm.  Near the entirety of the left lobe is expanded and replaced by metastatic disease with definable partially exophytic nodule arising from the inferior aspect of the medial segment of the left lobe of the liver now measuring approximately 5.4 x 6.2 cm (image 42) previously, 4.1 x 5.1 cm. Hepatomegaly again results in mass effect upon the adjacent organs. The portal vein remains patent.  No ascites.  There is symmetric enhancement and excretion of the bilateral kidneys.  Suspected punctate (110 mm) nonobstructing stone within the superior pole left kidney within the 35, series 2).  No definite right-sided renal stones.  No urinary obstruction or discrete renal lesion.  There is mild diffuse thickening of the left adrenal gland without discrete nodule.  Normal appearance of the right adrenal gland.  Normal appearance of the pancreas and spleen.  Incidental note is made of a small splenule.  Moderate to large colonic stool burden without evidence of obstruction.  No pneumoperitoneum, pneumatosis or portal venous gas.  Scattered atherosclerotic calcifications within a normal caliber abdominal aorta.  The major branch vessels of the abdominal aorta are patent on this non CT examination.  No definite retroperitoneal, mesenteric, pelvic or inguinal lymphadenopathy the evaluation of the pelvis is degraded secondary to streak artifact from the right hemiarthroplasty.  Multiple partially calcified uterine fibroids.  No discrete adnexal lesion.  No free fluid in the pelvis.  Grossly unchanged extensive osseous metastatic disease throughout the imaged thoracolumbar spine with dense  sclerotic metastases involving the T9, T10 and T11 vertebral bodies most conspicuously. Post decompressive laminectomies at T9 - T11. Epidural extension of disease is suspected as posterior to the L3 vertebral body (axial image 41, series 2 as well as posterior to the L1 vertebral body (axial image 28, series 2).  Likely interval progression of mixed lytic sclerotic lesion within the left ilium, now measuring 2.6 x 1.7 cm (image 59, series 2, previously, approximately 2.3 x 1.7 cm. Post right hip hemiarthroplasty.  No definite pathologic fractures.  IMPRESSION: 1.  Continued progression of extensive hepatic metastatic disease with resultant increased hepatomegaly resulting in mass effect upon the adjacent organs. 2.  Moderate to large colonic stool burden without evidence of obstruction. 3.  Grossly unchanged extensive osseous metastatic disease within the thoracolumbar spine with likely unchanged epidural involvement posterior to the L1 and L3 vertebral bodies.  4.  Suspected progression of mixed lytic/sclerotic lesion within the left ilium.   Original Report Authenticated By: Simonne Come  V, MD    Dg Abd Acute W/chest  07/07/2012  *RADIOLOGY REPORT*  Clinical Data: Abdominal pain, nausea and vomiting, evaluate for small bowel obstruction and free air  ACUTE ABDOMEN SERIES (ABDOMEN 2 VIEW & CHEST 1 VIEW)  Comparison: 06/15/2012; chest radiograph - 02/15/2012; CT abdomen pelvis - 04/20/2012  Findings:  Grossly unchanged cardiac silhouette and mediastinal contours with mild tortuosity of the thoracic aorta.  No focal parenchymal opacities.  There is persistent mild elevation of the right hemidiaphragm.  No pleural effusion or pneumothorax.  Moderate to large colonic stool burden without evidence of obstruction.  No pneumoperitoneum, pneumatosis or portal venous gas.  Post right hip hemiarthroplasty.  Surgical clips overlie the right axilla. Post right-sided mastectomy.  IMPRESSION: 1.  No acute cardiopulmonary  disease. 2.  Moderate to large colonic stool burden without evidence of obstruction.  No pneumoperitoneum.   Original Report Authenticated By: Tacey Ruiz, MD     Scheduled Meds:   . everolimus  5 mg Oral Daily  . exemestane  25 mg Oral QPC breakfast  . ferrous sulfate  325 mg Oral TID PC  . heparin  5,000 Units Subcutaneous Q8H  . milk and molasses   Rectal Once  . multivitamin with minerals  1 tablet Oral Daily  . ondansetron  4 mg Oral Q6H  . OxyCODONE  20 mg Oral BID  . polyethylene glycol  17 g Oral Daily  . potassium chloride SA  20 mEq Oral Daily   Continuous Infusions:   . sodium chloride 100 mL/hr at 07/07/12 1746  . sodium chloride 1,000 mL (07/08/12 0248)    Principal Problem:  *Acute abdominal pain Active Problems:  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  Bone metastases  Malignant neoplasm of breast (female), unspecified site  Constipation    Time spent: > 35 minutes    [name]  [facility] Pager [phone]. If 8PM-8AM, please contact night-coverage at [email or web address], password [facility] 07/08/2012, 2:58 PM  LOS: 1 day

## 2012-07-08 NOTE — Progress Notes (Signed)
Pt given soap suds enema. Pt only able to tolerate half the enema and refused to proceed with the rest.

## 2012-07-08 NOTE — Progress Notes (Signed)
Patient's medications from home Aromasin and Afinitor counted at bedside and sent to pharmacy.

## 2012-07-08 NOTE — Care Management Note (Unsigned)
    Page 1 of 1   07/08/2012     4:17:10 PM   CARE MANAGEMENT NOTE 07/08/2012  Patient:  [name], [name]   Account Number:  [id number]  Date Initiated:  07/08/2012  Documentation initiated by:  Lanier Clam  Subjective/Objective Assessment:   ADMITTED W/ACUTE ABD PAIN.XB:JYNWGN CA,METS BONE,LIVER.     Action/Plan:   FROM HOME W/KIDS.   Anticipated DC Date:  07/09/2012   Anticipated DC Plan:  HOME/SELF CARE      DC Planning Services  CM consult      Choice offered to / List presented to:             Status of service:  In process, will continue to follow Medicare Important Message given?   (If response is "NO", the following Medicare IM given date fields will be blank) Date Medicare IM given:   Date Additional Medicare IM given:    Discharge Disposition:    Per UR Regulation:  Reviewed for med. necessity/level of care/duration of stay  If discussed at Long Length of Stay Meetings, dates discussed:    Comments:  07/08/12 [facility] RN,BSN NCM [phone]

## 2012-07-08 NOTE — Progress Notes (Signed)
Nutrition Brief Note  Patient identified on the Malnutrition Screening Tool (MST) Report  Wt Readings from Last 10 Encounters:  07/07/12 126 lb 8.7 oz (57.4 kg)  06/21/12 135 lb 12.8 oz (61.598 kg)  06/09/12 138 lb (62.596 kg)  05/23/12 138 lb (62.596 kg)  05/23/12 138 lb 1.6 oz (62.642 kg)  05/13/12 146 lb 9.6 oz (66.497 kg)  05/05/12 140 lb (63.504 kg)  05/05/12 140 lb (63.504 kg)  05/03/12 146 lb 1.6 oz (66.271 kg)  05/03/12 132 lb (59.875 kg)   Usual wt: 110-115 lbs  Current diet order is Regular, patient is consuming approximately 100% of meals at this time. Labs and medications reviewed.   Pt admitted for acute abdominal pain in setting of metastatic breast cancer to bone and liver.  Pt is receiving bowel regimen for constipation.  Note oncology visit with pt who is discussing GOC and long-term options with pt.  Several attempts made to meet with pt, however, not available. Discussed with RN who reports pt has been eating well today and is getting treatment for her constipation. No nutrition interventions warranted at this time. If nutrition issues arise, please consult RD.   Loyce Dys, MS RD LDN Clinical Inpatient Dietitian Pager: [phone] Weekend/After hours pager: [phone]

## 2012-07-09 ENCOUNTER — Other Ambulatory Visit: Payer: Self-pay | Admitting: Oncology

## 2012-07-09 DIAGNOSIS — R5383 Other fatigue: Secondary | ICD-10-CM

## 2012-07-09 DIAGNOSIS — C50919 Malignant neoplasm of unspecified site of unspecified female breast: Secondary | ICD-10-CM

## 2012-07-09 DIAGNOSIS — R531 Weakness: Secondary | ICD-10-CM

## 2012-07-09 DIAGNOSIS — C787 Secondary malignant neoplasm of liver and intrahepatic bile duct: Secondary | ICD-10-CM

## 2012-07-09 DIAGNOSIS — K219 Gastro-esophageal reflux disease without esophagitis: Secondary | ICD-10-CM

## 2012-07-09 DIAGNOSIS — Z66 Do not resuscitate: Secondary | ICD-10-CM

## 2012-07-09 LAB — URINE CULTURE

## 2012-07-09 MED ORDER — MAGNESIUM CITRATE PO SOLN
1.0000 | Freq: Once | ORAL | Status: AC
  Administered 2012-07-09: 1 via ORAL

## 2012-07-09 MED ORDER — MAGNESIUM CITRATE PO SOLN
1.0000 | Freq: Once | ORAL | Status: AC | PRN
Start: 2012-07-09 — End: 2012-07-09

## 2012-07-09 NOTE — Progress Notes (Signed)
[name]   DOB:05-11-1966   ZO#:[id number]   JWJ#:[id number]  Subjective: [name] had some liquid BMs yesterday with enemas, but "there's still a lot of stool there." Pain is moderately controlled. She is able to ambulate but it hurts more when she does and she is staying in bed most of the day. Long discussion re prognosis and she understands she has a 50-50 chance of dying within the next 6 months, which qualifies her for hospice. She has been thinking about DNR status and now agrees to a no resuscitation order. She is interested in having CNA help at home.No family in room   Objective: middle-aged [place] woman examined in bed Filed Vitals:   07/09/12 0602  BP: 147/86  Pulse: 95  Temp: 98.5 F (36.9 C)  Resp: 20    Body mass index is 23.14 kg/(m^2).  Intake/Output Summary (Last 24 hours) at 07/09/12 0750 Last data filed at 07/08/12 1924  Gross per 24 hour  Intake   2100 ml  Output      0 ml  Net   2100 ml     Sclerae unicteric  Oropharynx clear  No peripheral adenopathy  Lungs clear -- no rales or rhonchi, auscultated anteriorly  Heart regular rate and rhythm  Abdomen distended, mod focal tenderness RUQ medially, +BS  MSK no focal spinal tenderness, no peripheral edema  Neuro nonfocal, well-oriented, appropriate affect  Breast exam: deferred  CBG (last 3)  No results found for this basename: GLUCAP,  in the last 72 hours   Labs:  Lab Results  Component Value Date   WBC 4.5 07/08/2012   HGB 9.1* 07/08/2012   HCT 27.9* 07/08/2012   MCV 82.5 07/08/2012   PLT 336 07/08/2012   NEUTROABS 4.1 07/07/2012    @LASTCHEMISTRY @  Urine Studies No results found for this basename: UACOL, UAPR, USPG, UPH, UTP, UGL, UKET, UBIL, UHGB, UNIT, UROB, ULEU, UEPI, UWBC, URBC, UBAC, CAST, CRYS, UCOM, BILUA,  in the last 72 hours  Basic Metabolic Panel:  Recent Labs Lab 07/07/12 1740 07/08/12 0454  NA 140 140  K 2.9* 3.7  CL 100 103  CO2 25 25  GLUCOSE 88 97  BUN 5* 5*   CREATININE 0.70 0.66  CALCIUM 9.2 8.8   GFR The CrCl is unknown because both a height and weight (above a minimum accepted value) are required for this calculation. Liver Function Tests:  Recent Labs Lab 07/07/12 1740  AST 34  ALT 9  ALKPHOS 358*  BILITOT 0.5  PROT 7.6  ALBUMIN 3.4*    Recent Labs Lab 07/07/12 1740  LIPASE 12   No results found for this basename: AMMONIA,  in the last 168 hours Coagulation profile No results found for this basename: INR, PROTIME,  in the last 168 hours  CBC:  Recent Labs Lab 07/07/12 1740 07/08/12 0454  WBC 5.6 4.5  NEUTROABS 4.1  --   HGB 10.1* 9.1*  HCT 30.9* 27.9*  MCV 82.0 82.5  PLT 388 336   Cardiac Enzymes: No results found for this basename: CKTOTAL, CKMB, CKMBINDEX, TROPONINI,  in the last 168 hours BNP: No components found with this basename: POCBNP,  CBG: No results found for this basename: GLUCAP,  in the last 168 hours D-Dimer No results found for this basename: DDIMER,  in the last 72 hours Hgb A1c No results found for this basename: HGBA1C,  in the last 72 hours Lipid Profile No results found for this basename: CHOL, HDL, LDLCALC, TRIG, CHOLHDL,  LDLDIRECT,  in the last 72 hours Thyroid function studies No results found for this basename: TSH, T4TOTAL, FREET3, T3FREE, THYROIDAB,  in the last 72 hours Anemia work up No results found for this basename: VITAMINB12, FOLATE, FERRITIN, TIBC, IRON, RETICCTPCT,  in the last 72 hours Microbiology Recent Results (from the past 240 hour(s))  URINE CULTURE     Status: None   Collection Time    07/07/12  9:15 PM      Result Value Range Status   Specimen Description URINE, RANDOM   Final   Special Requests NONE   Final   Culture  Setup Time 07/08/2012 06:00   Final   Colony Count NO GROWTH   Final   Culture NO GROWTH   Final   Report Status 07/09/2012 FINAL   Final      Studies:  Ct Abdomen Pelvis W Contrast  07/07/2012  *RADIOLOGY REPORT*  Clinical Data:  Abdominal pain, nausea and vomiting; history of metastatic breast cancer with bone and brain metastases  CT ABDOMEN AND PELVIS WITH CONTRAST  Technique:  Multidetector CT imaging of the abdomen and pelvis was performed following the standard protocol during bolus administration of intravenous contrast.  Contrast: 50mL OMNIPAQUE IOHEXOL 300 MG/ML  SOLN, 80mL OMNIPAQUE IOHEXOL 300 MG/ML  SOLN  Comparison: CT abdomen pelvis - 04/20/2012  Findings:  Limited visualization of lower thorax is made of focal airspace opacity or pleural effusion.  Normal heart size.  No pericardial effusion.  Continued progression of extensive hepatic metastatic disease with dominant mass within the dome of the right lobe the liver now measuring approximately 10.8 x 9.7 cm of image 15, series 2, previously, 9.2 x 8.5 cm.  Near the entirety of the left lobe is expanded and replaced by metastatic disease with definable partially exophytic nodule arising from the inferior aspect of the medial segment of the left lobe of the liver now measuring approximately 5.4 x 6.2 cm (image 42) previously, 4.1 x 5.1 cm. Hepatomegaly again results in mass effect upon the adjacent organs. The portal vein remains patent.  No ascites.  There is symmetric enhancement and excretion of the bilateral kidneys.  Suspected punctate (110 mm) nonobstructing stone within the superior pole left kidney within the 35, series 2).  No definite right-sided renal stones.  No urinary obstruction or discrete renal lesion.  There is mild diffuse thickening of the left adrenal gland without discrete nodule.  Normal appearance of the right adrenal gland.  Normal appearance of the pancreas and spleen.  Incidental note is made of a small splenule.  Moderate to large colonic stool burden without evidence of obstruction.  No pneumoperitoneum, pneumatosis or portal venous gas.  Scattered atherosclerotic calcifications within a normal caliber abdominal aorta.  The major branch vessels of  the abdominal aorta are patent on this non CT examination.  No definite retroperitoneal, mesenteric, pelvic or inguinal lymphadenopathy the evaluation of the pelvis is degraded secondary to streak artifact from the right hemiarthroplasty.  Multiple partially calcified uterine fibroids.  No discrete adnexal lesion.  No free fluid in the pelvis.  Grossly unchanged extensive osseous metastatic disease throughout the imaged thoracolumbar spine with dense sclerotic metastases involving the T9, T10 and T11 vertebral bodies most conspicuously. Post decompressive laminectomies at T9 - T11. Epidural extension of disease is suspected as posterior to the L3 vertebral body (axial image 41, series 2 as well as posterior to the L1 vertebral body (axial image 28, series 2).  Likely interval progression of mixed lytic  sclerotic lesion within the left ilium, now measuring 2.6 x 1.7 cm (image 59, series 2, previously, approximately 2.3 x 1.7 cm. Post right hip hemiarthroplasty.  No definite pathologic fractures.  IMPRESSION: 1.  Continued progression of extensive hepatic metastatic disease with resultant increased hepatomegaly resulting in mass effect upon the adjacent organs. 2.  Moderate to large colonic stool burden without evidence of obstruction. 3.  Grossly unchanged extensive osseous metastatic disease within the thoracolumbar spine with likely unchanged epidural involvement posterior to the L1 and L3 vertebral bodies.  4.  Suspected progression of mixed lytic/sclerotic lesion within the left ilium.   Original Report Authenticated By: Tacey Ruiz, MD    Dg Abd Acute W/chest  07/07/2012  *RADIOLOGY REPORT*  Clinical Data: Abdominal pain, nausea and vomiting, evaluate for small bowel obstruction and free air  ACUTE ABDOMEN SERIES (ABDOMEN 2 VIEW & CHEST 1 VIEW)  Comparison: 06/15/2012; chest radiograph - 02/15/2012; CT abdomen pelvis - 04/20/2012  Findings:  Grossly unchanged cardiac silhouette and mediastinal contours with  mild tortuosity of the thoracic aorta.  No focal parenchymal opacities.  There is persistent mild elevation of the right hemidiaphragm.  No pleural effusion or pneumothorax.  Moderate to large colonic stool burden without evidence of obstruction.  No pneumoperitoneum, pneumatosis or portal venous gas.  Post right hip hemiarthroplasty.  Surgical clips overlie the right axilla. Post right-sided mastectomy.  IMPRESSION: 1.  No acute cardiopulmonary disease. 2.  Moderate to large colonic stool burden without evidence of obstruction.  No pneumoperitoneum.   Original Report Authenticated By: Tacey Ruiz, MD     Assessment: 47 y.o. [place] woman admitted with uncontrolled pain, constipation  1. 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  2.Status post adjuvant chemotherapy July to November 2011, consisting of 4 cycles of dose dense doxorubicin and cyclophosphamide, followed by 4 of 12 planned weekly doses of paclitaxel given adjuvantly, discontinued secondary to peripheral neuropathy.  3. Status post radiation to the right chest wall, truncated due to problems with the patient missing appointments.  4. Tamoxifen started June of 2012, but discontinued due to nausea.  5. metastatic disease to liver and bone pathologically documented October of 2012, estrogen receptor 100% positive, progesterone receptor 7% positive, with no HER-2 amplification; treatment in the metastatic setting has consisted of:  6.Faslodex started 03/16/2011, discontinued November 2013 with progression. Zoladex every 3 months also started on 03/16/2011, and monthly zoledronic acid started November 2011, the Zometa I currently held due to pending dental procedures and possible extractions.  7. Status post thoracic laminectomy, T9-T11, for impending cord  compression on 03/21/2011, followed by irradiation to the spine completed November 2011  8. 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 s/p palliative radiation to the Right hip clivus to 30 Gy completed 04/07/2012  10. Progression noted by scans in November 2013, at which time Faslodex was discontinued. Patient was started on exemestane in early December, with everolimus started in mid December at 5 mg daily. Continues to receive Zoladex injections every 3 months.  11. Nondisplaced right femoral neck fracture, status post right hip arthroplasty on 05/04/2012 under the care of Dr.Matt Chi [facility].  12. Hypercalcemia with elevated serum creatinine    Plan: Long discussion again with patient. She is interested  in a Hospice referral and has agreed to a DNR order. She has been working towards a CNA to help he 3-5 days a week but that has not been formalized. I am  (a) changing code status to DNR  (b) placing a Hospice consult (no need for palliative care consult)--patient's life expectancy is <6 months  (c) writing for additional laxatives today  (d) scheduling follow-up at our clinic within 2 weeks If discharge planning can be completed today and the pt has a better "clean out" of her constipation, she could be discharged home 02/09 AM. If she does go please make sure she has a 1-2 day supply of pain meds as sometimes she has trouble getting refills from her pharmacy  Thank you for your help to this patient!   MAGRINAT,GUSTAV C 07/09/2012

## 2012-07-09 NOTE — Care Management Note (Signed)
Received referral to offer home hospice choice. Spoke with patient and family at bedside to discuss hospice choices. Patient chose [facility] for hospice services. Patient to discharge home tomorrow. Confirmed contact information as being patient's cell number [phone], [name] (sister) [phone], and [name] (sister) [phone]. Confirmed patient's daughter will be staying with her at home 24/7 post discharge. Call made to [facility] triage nurse to make aware of discharge home tomorrow with hospice services. Made aware that discharge early on 07-10-12 is needed to facilitate to hospice services at home. Spoke with Dr Cena Benton about hospice services being arranged. Will fax orders, notes and etc to [facility] at [phone] referral center. [place], [place] [phone]

## 2012-07-09 NOTE — Progress Notes (Signed)
[facility] PROGRESS NOTE  [name] AVW:[id number] DOB: 07-31-1965 DOA: 07/07/2012 PCP: No primary provider on file.  Assessment/Plan: Acute abdominal pain  -This is likely multifactorial secondary to her constipation 2ary to opiod medication and metastatic breast cancer.  - Her liver metastases extending her hepatic capsule probably causing her pain.  - Patient was started recently on MS Contin and Percocet for that.  - I will continue her home regimen, add IV morphine while she is in the hospital.  - Patient to have residential hospice at home to help with symptoms once ready for discharge - Improved after BM's  Constipation  -CT scan of abdomen and pelvis showed large stool burden, likely secondary to oral narcotics.  - Oncology has recommended stool softeners.  Had bowel movements yesterday. - improving after recent stool softener administration and enema's.  Stage IV breast cancer  -With metastasis to the bone and liver, she had history of right hip pathological fracture status post hemiarthroplasty.  - Dr. Darnelle Catalan on board - Based on recent CT scan results and extension of patient's carcinoma at this point hospice has been recommended.   - Hospice consult place. Patient to go home with home hospice.   Hypokalemia  -Resolved after oral repletion  Hypercalcemia  -Secondary to bone metastases, per oncology notes patient start on Xgeva.  -The calcium is normal today.   Code Status: DNR Family Communication: no family at bedside Disposition Plan: pending improvement in abdominal discomfort.    Consultants:  none  Procedures:  none  Antibiotics:  none  HPI/Subjective: Patient had limited interaction with examiner.  Had flat affect and was recently told that she had less than 6 months to live.    Objective: Filed Vitals:   07/08/12 1452 07/08/12 2159 07/09/12 0602 07/09/12 0800  BP: 113/71 110/75 147/86   Pulse: 89 88 95   Temp: 97.5 F (36.4 C)  98.4 F (36.9 C) 98.5 F (36.9 C)   TempSrc: Oral Oral Oral   Resp: 18 20 20    Height:    5\' 2"  (1.575 m)  Weight:    60.782 kg (134 lb)  SpO2: 98% 96%      Intake/Output Summary (Last 24 hours) at 07/09/12 1411 Last data filed at 07/08/12 1924  Gross per 24 hour  Intake   1140 ml  Output      0 ml  Net   1140 ml   Filed Weights   07/07/12 2307 07/09/12 0800  Weight: 57.4 kg (126 lb 8.7 oz) 60.782 kg (134 lb)    Exam:   General:  Pt in NAD, Alert and Awake  Cardiovascular: RRR, No MRG  Respiratory: CTA BL, no wheezes  Abdomen: soft, NT, ND, + hepatomegaly  Psych: flat affect  Data Reviewed: Basic Metabolic Panel:  Recent Labs Lab 07/07/12 1740 07/08/12 0454  NA 140 140  K 2.9* 3.7  CL 100 103  CO2 25 25  GLUCOSE 88 97  BUN 5* 5*  CREATININE 0.70 0.66  CALCIUM 9.2 8.8   Liver Function Tests:  Recent Labs Lab 07/07/12 1740  AST 34  ALT 9  ALKPHOS 358*  BILITOT 0.5  PROT 7.6  ALBUMIN 3.4*    Recent Labs Lab 07/07/12 1740  LIPASE 12   No results found for this basename: AMMONIA,  in the last 168 hours CBC:  Recent Labs Lab 07/07/12 1740 07/08/12 0454  WBC 5.6 4.5  NEUTROABS 4.1  --   HGB 10.1* 9.1*  HCT 30.9* 27.9*  MCV 82.0 82.5  PLT 388 336   Cardiac Enzymes: No results found for this basename: CKTOTAL, CKMB, CKMBINDEX, TROPONINI,  in the last 168 hours BNP (last 3 results) No results found for this basename: PROBNP,  in the last 8760 hours CBG: No results found for this basename: GLUCAP,  in the last 168 hours  Recent Results (from the past 240 hour(s))  URINE CULTURE     Status: None   Collection Time    07/07/12  9:15 PM      Result Value Range Status   Specimen Description URINE, RANDOM   Final   Special Requests NONE   Final   Culture  Setup Time 07/08/2012 06:00   Final   Colony Count NO GROWTH   Final   Culture NO GROWTH   Final   Report Status 07/09/2012 FINAL   Final     Studies: Ct Abdomen Pelvis W  Contrast  07/07/2012  *RADIOLOGY REPORT*  Clinical Data: Abdominal pain, nausea and vomiting; history of metastatic breast cancer with bone and brain metastases  CT ABDOMEN AND PELVIS WITH CONTRAST  Technique:  Multidetector CT imaging of the abdomen and pelvis was performed following the standard protocol during bolus administration of intravenous contrast.  Contrast: 50mL OMNIPAQUE IOHEXOL 300 MG/ML  SOLN, 80mL OMNIPAQUE IOHEXOL 300 MG/ML  SOLN  Comparison: CT abdomen pelvis - 04/20/2012  Findings:  Limited visualization of lower thorax is made of focal airspace opacity or pleural effusion.  Normal heart size.  No pericardial effusion.  Continued progression of extensive hepatic metastatic disease with dominant mass within the dome of the right lobe the liver now measuring approximately 10.8 x 9.7 cm of image 15, series 2, previously, 9.2 x 8.5 cm.  Near the entirety of the left lobe is expanded and replaced by metastatic disease with definable partially exophytic nodule arising from the inferior aspect of the medial segment of the left lobe of the liver now measuring approximately 5.4 x 6.2 cm (image 42) previously, 4.1 x 5.1 cm. Hepatomegaly again results in mass effect upon the adjacent organs. The portal vein remains patent.  No ascites.  There is symmetric enhancement and excretion of the bilateral kidneys.  Suspected punctate (110 mm) nonobstructing stone within the superior pole left kidney within the 35, series 2).  No definite right-sided renal stones.  No urinary obstruction or discrete renal lesion.  There is mild diffuse thickening of the left adrenal gland without discrete nodule.  Normal appearance of the right adrenal gland.  Normal appearance of the pancreas and spleen.  Incidental note is made of a small splenule.  Moderate to large colonic stool burden without evidence of obstruction.  No pneumoperitoneum, pneumatosis or portal venous gas.  Scattered atherosclerotic calcifications within a normal  caliber abdominal aorta.  The major branch vessels of the abdominal aorta are patent on this non CT examination.  No definite retroperitoneal, mesenteric, pelvic or inguinal lymphadenopathy the evaluation of the pelvis is degraded secondary to streak artifact from the right hemiarthroplasty.  Multiple partially calcified uterine fibroids.  No discrete adnexal lesion.  No free fluid in the pelvis.  Grossly unchanged extensive osseous metastatic disease throughout the imaged thoracolumbar spine with dense sclerotic metastases involving the T9, T10 and T11 vertebral bodies most conspicuously. Post decompressive laminectomies at T9 - T11. Epidural extension of disease is suspected as posterior to the L3 vertebral body (axial image 41, series 2 as well as posterior to the L1 vertebral body (axial image 28, series  2).  Likely interval progression of mixed lytic sclerotic lesion within the left ilium, now measuring 2.6 x 1.7 cm (image 59, series 2, previously, approximately 2.3 x 1.7 cm. Post right hip hemiarthroplasty.  No definite pathologic fractures.  IMPRESSION: 1.  Continued progression of extensive hepatic metastatic disease with resultant increased hepatomegaly resulting in mass effect upon the adjacent organs. 2.  Moderate to large colonic stool burden without evidence of obstruction. 3.  Grossly unchanged extensive osseous metastatic disease within the thoracolumbar spine with likely unchanged epidural involvement posterior to the L1 and L3 vertebral bodies.  4.  Suspected progression of mixed lytic/sclerotic lesion within the left ilium.   Original Report Authenticated By: Tacey Ruiz, MD    Dg Abd Acute W/chest  07/07/2012  *RADIOLOGY REPORT*  Clinical Data: Abdominal pain, nausea and vomiting, evaluate for small bowel obstruction and free air  ACUTE ABDOMEN SERIES (ABDOMEN 2 VIEW & CHEST 1 VIEW)  Comparison: 06/15/2012; chest radiograph - 02/15/2012; CT abdomen pelvis - 04/20/2012  Findings:  Grossly  unchanged cardiac silhouette and mediastinal contours with mild tortuosity of the thoracic aorta.  No focal parenchymal opacities.  There is persistent mild elevation of the right hemidiaphragm.  No pleural effusion or pneumothorax.  Moderate to large colonic stool burden without evidence of obstruction.  No pneumoperitoneum, pneumatosis or portal venous gas.  Post right hip hemiarthroplasty.  Surgical clips overlie the right axilla. Post right-sided mastectomy.  IMPRESSION: 1.  No acute cardiopulmonary disease. 2.  Moderate to large colonic stool burden without evidence of obstruction.  No pneumoperitoneum.   Original Report Authenticated By: Tacey Ruiz, MD     Scheduled Meds: . everolimus  5 mg Oral Daily  . exemestane  25 mg Oral QPC breakfast  . ferrous sulfate  325 mg Oral TID PC  . heparin  5,000 Units Subcutaneous Q8H  . multivitamin with minerals  1 tablet Oral Daily  . ondansetron  4 mg Oral Q6H  . OxyCODONE  20 mg Oral BID  . polyethylene glycol  17 g Oral Daily  . potassium chloride SA  20 mEq Oral Daily   Continuous Infusions: . sodium chloride 75 mL/hr at 07/09/12 1216    Principal Problem:   Acute abdominal pain Active Problems:   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   Bone metastases   Malignant neoplasm of breast (female), unspecified site   Constipation    Time spent: > 35 minutes    Penny Pia  [facility] Pager [phone]. If 8PM-8AM, please contact night-coverage at [email or web address], password [facility] 07/09/2012, 2:11 PM  LOS: 2 days

## 2012-07-09 NOTE — Consult Note (Signed)
Patient ZO:XWRUEAV ALYANNA Mullins      DOB: 1965-07-16      WUJ:[id number]     Consult Note from the Palliative Medicine Team at [facility]    Consult Requested by: Dr Darnelle Catalan     PCP: No primary provider on file. Reason for Consultation:Clarification of GOC and options     Phone Number:None  Assessment of patients Current state: Stage IV breast cancer with metastasis to bone and liver. Working with her oncologist and has had conversation to her overall poor prognosis.  She is beginning to realize her limited prognosis--plan is to dc with hospice and hopefully they will continue to support her in this process   Goals of Care: 1.  Code Status: DNR/DNI   2. Scope of Treatment:  Focus is home with comfort   4. Disposition: Plan is to dc home with hospice tomorrow   3. Symptom Management:    1. Pain: Percoct 5-325 1-2 tablets every 6 hrs prn (as previously ordered)                 Oxycodone IR 5-10 mg every 6 hrs prn (as previously ordered)   4. Psychosocial:  Emotional support offered at bedside, patient is experiencing lots of existential pain related to her past.  She is pained by the fact that her 1 year old son lives with his father and visitation is not what she would like, she struggles with her relationship with her daughter.   Allowed her verbalize her feelings and cry, remained present for support.  We talked about how to utilize hospice services for psych support in the future.         Brief HPI: Stage IV breast cancer with metastasis to bone and liver. Working with her oncologist and has had conversation to her overall poor prognosis.  She is beginning to realize her limited prognosis--plan is to dc with hospice and hopefully they will continue to support her in this process      ROS: intermittent abdominal pain, weakness, constipation    PMH:  Past Medical History  Diagnosis Date  . Hypertension   . GERD (gastroesophageal reflux disease)   . Bone  metastases 2012    "spread from my breast"  . Pyelonephritis 10/01/11  . Recurrent UTI (urinary tract infection) 10/01/11  . S/P chemotherapy, time since 4-12 weeks   . S/P radiation > 12 weeks   . 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 Brain cancer     spread from breast ca  . Hypercalcemia 06/21/2012     PSH: Past Surgical History  Procedure Laterality Date  . Cesarean section  1989; 2010  . Mastectomy  2011    right  . Back surgery  2012    "removed tumor,cancer, from my spine  . Port-a-cath removal  2012    right chest  . Portacath placement  2011    right  . Tubal ligation  2010  . Breast biopsy  2011    right  . Hip arthroplasty  05/04/2012    Procedure: ARTHROPLASTY BIPOLAR HIP;  Surgeon: Shelda Pal, MD;  Location: [facility];  Service: Orthopedics;  Laterality: Right;  RIGHT HIP HEMI ARTHROPLASTY    I have reviewed the FH and SH and  If appropriate update it with new information. No Known Allergies Scheduled Meds: . everolimus  5 mg Oral Daily  . exemestane  25 mg Oral QPC breakfast  . ferrous sulfate  325 mg Oral TID PC  . heparin  5,000 Units Subcutaneous Q8H  . multivitamin with minerals  1 tablet Oral Daily  . ondansetron  4 mg Oral Q6H  . OxyCODONE  20 mg Oral BID  . polyethylene glycol  17 g Oral Daily  . potassium chloride SA  20 mEq Oral Daily   Continuous Infusions: . sodium chloride 75 mL/hr at 07/09/12 1216   PRN Meds:.acetaminophen, acetaminophen, magnesium citrate, morphine injection, ondansetron (ZOFRAN) IV, ondansetron, oxyCODONE, oxyCODONE-acetaminophen    BP 138/88  Pulse 98  Temp(Src) 98.3 F (36.8 C) (Oral)  Resp 20  Ht 5\' 2"  (1.575 m)  Wt 60.782 kg (134 lb)  BMI 24.5 kg/m2  SpO2 98%  LMP 03/02/2011   PPS:50%   Intake/Output Summary (Last 24 hours) at 07/09/12 1524 Last data filed at 07/09/12 1512  Gross per 24 hour  Intake   1620 ml  Output      0 ml  Net   1620 ml   LBM: 07-09-12                      Stool Softner: mag citate/miralax  Physical Exam:  General: NAD, hair loss pattern secondary with chemo TX HEENT:  Moist buccal membranes Chest:   CTA CVS: RRR Abdomen: distended with tenderness on palpation Ext: without edema Neuro: alert and oriented X3  Labs: CBC    Component Value Date/Time   WBC 4.5 07/08/2012 0454   WBC 8.0 06/21/2012 1123   RBC 3.38* 07/08/2012 0454   RBC 3.68* 06/21/2012 1123   HGB 9.1* 07/08/2012 0454   HGB 10.1* 06/21/2012 1123   HCT 27.9* 07/08/2012 0454   HCT 30.3* 06/21/2012 1123   PLT 336 07/08/2012 0454   PLT 361 06/21/2012 1123   MCV 82.5 07/08/2012 0454   MCV 82.4 06/21/2012 1123   MCH 26.9 07/08/2012 0454   MCH 27.5 06/21/2012 1123   MCHC 32.6 07/08/2012 0454   MCHC 33.4 06/21/2012 1123   RDW 18.9* 07/08/2012 0454   RDW 19.2* 06/21/2012 1123   LYMPHSABS 1.2 07/07/2012 1740   LYMPHSABS 1.0 06/21/2012 1123   MONOABS 0.3 07/07/2012 1740   MONOABS 0.6 06/21/2012 1123   EOSABS 0.0 07/07/2012 1740   EOSABS 0.0 06/21/2012 1123   BASOSABS 0.0 07/07/2012 1740   BASOSABS 0.0 06/21/2012 1123    BMET    Component Value Date/Time   NA 140 07/08/2012 0454   NA 137 06/21/2012 1123   K 3.7 07/08/2012 0454   K 4.6 06/21/2012 1123   CL 103 07/08/2012 0454   CL 95* 06/21/2012 1123   CO2 25 07/08/2012 0454   CO2 29 06/21/2012 1123   GLUCOSE 97 07/08/2012 0454   GLUCOSE 131* 06/21/2012 1123   BUN 5* 07/08/2012 0454   BUN 19.0 06/21/2012 1123   CREATININE 0.66 07/08/2012 0454   CREATININE 1.4* 06/21/2012 1123   CALCIUM 8.8 07/08/2012 0454   CALCIUM 10.6* 06/21/2012 1123   GFRNONAA >90 07/08/2012 0454   GFRAA >90 07/08/2012 0454    CMP     Component Value Date/Time   NA 140 07/08/2012 0454   NA 137 06/21/2012 1123   K 3.7 07/08/2012 0454   K 4.6 06/21/2012 1123   CL 103 07/08/2012 0454   CL 95* 06/21/2012 1123   CO2 25 07/08/2012 0454   CO2 29 06/21/2012 1123   GLUCOSE 97 07/08/2012 0454   GLUCOSE 131* 06/21/2012 1123   BUN 5* 07/08/2012 0454   BUN  19.0 06/21/2012 1123   CREATININE 0.66 07/08/2012 0454    CREATININE 1.4* 06/21/2012 1123   CALCIUM 8.8 07/08/2012 0454   CALCIUM 10.6* 06/21/2012 1123   PROT 7.6 07/07/2012 1740   PROT 8.0 06/21/2012 1123   ALBUMIN 3.4* 07/07/2012 1740   ALBUMIN 3.3* 06/21/2012 1123   AST 34 07/07/2012 1740   AST 48* 06/21/2012 1123   ALT 9 07/07/2012 1740   ALT 10 06/21/2012 1123   ALKPHOS 358* 07/07/2012 1740   ALKPHOS 302* 06/21/2012 1123   BILITOT 0.5 07/07/2012 1740   BILITOT 0.68 06/21/2012 1123   GFRNONAA >90 07/08/2012 0454   GFRAA >90 07/08/2012 0454      Time In Time Out Total Time Spent with Patient Total Overall Time  0830 0950 70 min 80 min    Greater than 50%  of this time was spent counseling and coordinating care related to the above assessment and plan.  Discussed with Dr Cena Benton and Dr Darnelle Catalan  Lorinda Creed NP  [phone]

## 2012-07-10 DIAGNOSIS — E876 Hypokalemia: Secondary | ICD-10-CM

## 2012-07-10 MED ORDER — SENNA-DOCUSATE SODIUM 8.6-50 MG PO TABS: 1.0000 | ORAL_TABLET | Freq: Every day | ORAL | Status: DC | PRN

## 2012-07-10 MED ORDER — OXYCODONE HCL 5 MG PO TABS: 5.0000 mg | ORAL_TABLET | Freq: Four times a day (QID) | ORAL | Status: DC | PRN

## 2012-07-10 MED ORDER — OXYCODONE-ACETAMINOPHEN 5-325 MG PO TABS: 1.0000 | ORAL_TABLET | Freq: Four times a day (QID) | ORAL | Status: DC | PRN

## 2012-07-10 MED ORDER — DOCUSATE SODIUM 100 MG PO CAPS: 100.0000 mg | ORAL_CAPSULE | Freq: Two times a day (BID) | ORAL | Status: DC | PRN

## 2012-07-10 MED ORDER — MORPHINE SULFATE 2 MG/ML IJ SOLN
1.0000 mg | Freq: Once | INTRAMUSCULAR | Status: AC
  Administered 2012-07-10: 1 mg via INTRAVENOUS

## 2012-07-10 NOTE — Discharge Summary (Signed)
Physician Discharge Summary  SOLARA GOODCHILD WJX:[id number] DOB: 03/21/1966 DOA: 07/07/2012  PCP: No primary provider on file.  Admit date: 07/07/2012 Discharge date: 07/10/2012  Time spent: > 35 minutes  Recommendations for Outpatient Follow-up:  1. F/u with your oncologist for further recommendations regarding symptoms management related to your metastatic cancer. 2. Residential hospice to help patient at home with her symptom management as well.  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 Patient calls with the [facility] she was seen on 06/21/2012. Recommendation at that time to continue her exemestane, everolimus and Zoladex, she was also complaining about pain so she was started on 20 mg of MS Contin twice a day and Percocet. Patient came in to the emergency department today complaining about severe abdominal pain.   Hospital Course:  Acute abdominal pain  -This is likely multifactorial secondary to her constipation 2ary to opiod medication and metastatic breast cancer.  - Her liver metastases extending her hepatic capsule probably causing her pain.  - Discharge on percocet and oxycodone IR as indicated in palliative care's recommendations.  - Patient to have residential hospice at home to help with symptoms once ready for discharge  - Improved after BM's   Constipation  -CT scan of abdomen and pelvis showed large stool  burden, likely secondary to oral narcotics.  - Oncology has recommended stool softeners and enema's  - improving after recent stool softener administration and enema's.  - BM's reported while in house after above treatment therapy  Stage IV breast cancer  -With metastasis to the bone and liver, she had history of right hip pathological fracture status post hemiarthroplasty.  - Dr. Darnelle Catalan on board while patient in house. Patient to continue to f/u for further recommendations. - Based on recent CT scan results and extension of patient's carcinoma at this point hospice has been recommended.  -  Patient to go home with home hospice.  Discussed with case manager and currently setting this up.  Hypokalemia  -Resolved after oral repletion   Hypercalcemia  -Secondary to bone metastases, per oncology notes patient start on Xgeva.    Procedures:  none  Consultations:  Oncology: Dr. Darnelle Catalan  Palliative: Lorinda Creed  Discharge Exam: Filed Vitals:   07/09/12 0800 07/09/12 1511 07/09/12 2200 07/10/12 0600  BP:  138/88 125/80 121/75  Pulse:  98 95 95  Temp:  98.3 F (36.8 C) 97.8 F (36.6 C) 98.4 F (36.9 C)  TempSrc:  Oral Oral Oral  Resp:  20 20 18   Height: 5\' 2"  (1.575 m)     Weight: 60.782 kg (134 lb)     SpO2:  98% 97% 96%    General: Pt in NAD, Alert and Awake Cardiovascular: RRR, No MRG Respiratory: CTA BL, no wheezes  Discharge Instructions  Discharge Orders   Future Appointments Provider Department Dept Phone   07/11/2012 10:45 AM Wind[facility] MEDICAL  ONCOLOGY [phone]   07/11/2012 11:15 AM Amy Allegra Grana, PA [facility] MEDICAL ONCOLOGY [phone]   Future Orders Complete By Expires     Call MD for:  severe uncontrolled pain  As directed     Call MD for:  temperature >100.4  As directed     Diet - low sodium heart healthy  As directed     Discharge instructions  As directed     Comments:      Please be sure to  follow up with your oncologist for further recommendations and symptom management.  Try to drink plenty of water and ambulate to help against constipation.    Increase activity slowly  As directed         Medication List    STOP taking these medications       amLODipine 5 MG tablet  Commonly known as:  NORVASC     methocarbamol 500 MG tablet  Commonly known as:  ROBAXIN     multivitamin with minerals Tabs     oxyCODONE 20 MG 12 hr tablet  Commonly known as:  OXYCONTIN     oxyCODONE-acetaminophen 10-325 MG per tablet  Commonly known as:  PERCOCET     potassium chloride SA 20 MEQ tablet  Commonly known as:  K-DUR,KLOR-CON     promethazine 12.5 MG tablet  Commonly known as:  PHENERGAN      TAKE these medications       docusate sodium 100 MG capsule  Commonly known as:  COLACE  Take 1 capsule (100 mg total) by mouth 2 (two) times daily as needed for constipation.     everolimus 5 MG tablet  Commonly known as:  AFINITOR  Take 5 mg by mouth daily.     exemestane 25 MG tablet  Commonly known as:  AROMASIN  Take 25 mg by mouth daily after breakfast.     ferrous sulfate 325 (65 FE) MG tablet  Take 325 mg by mouth 3 (three) times daily after meals.     ondansetron 4 MG tablet  Commonly known as:  ZOFRAN  Take 4 mg by mouth every 6 (six) hours.     oxyCODONE 5 MG immediate release tablet  Commonly known as:  Oxy IR/ROXICODONE  Take 1-2 tablets (5-10 mg total) by mouth every 6 (six) hours as needed.     oxyCODONE-acetaminophen 5-325 MG per tablet  Commonly known as:  PERCOCET/ROXICET  Take 1-2 tablets by mouth every 6 (six) hours as needed for pain.     sennosides-docusate sodium 8.6-50 MG tablet  Commonly known as:  SENOKOT-S  Take 1 tablet by mouth daily as needed for constipation.          The results of significant diagnostics from this hospitalization (including imaging, microbiology, ancillary and laboratory) are listed below for reference.     Significant Diagnostic Studies: Ct Head Wo Contrast  06/15/2012  *RADIOLOGY REPORT*  Clinical Data: Headache.  Breast cancer.  Nausea.  The  CT HEAD WITHOUT CONTRAST  Technique:  Contiguous axial images were obtained from the base of the skull through the vertex without contrast.  Comparison: 02/20/2012.  02/15/2012.  Findings: The brain has a normal appearance without evidence of atrophy, old or acute infarction, mass lesion, hemorrhage, hydrocephalus or extra-axial collection.  Previously seen destructive lesion of the clivus with extension into the inferior sphenoid sinus has been treated and appears healed.  No ongoing destructive process is evident in the skull base or anywhere in  the calvarium.  Visualized sinuses, middle ears and mastoids are clear.  IMPRESSION: No intracranial abnormality.  Treatment of a previous clival metastasis.  No evidence of residual or recurrent active tumor in that region.   Original Report Authenticated By: Paulina Fusi, M.D.    Ct Abdomen Pelvis W Contrast  07/07/2012  *RADIOLOGY REPORT*  Clinical Data: Abdominal pain, nausea and vomiting; history of metastatic breast cancer with bone and brain metastases  CT ABDOMEN AND PELVIS WITH CONTRAST  Technique:  Multidetector CT imaging of the abdomen and pelvis was performed following the standard protocol during bolus administration of intravenous contrast.  Contrast: 50mL OMNIPAQUE IOHEXOL 300 MG/ML  SOLN, 80mL OMNIPAQUE IOHEXOL 300 MG/ML  SOLN  Comparison: CT abdomen pelvis - 04/20/2012  Findings:  Limited visualization of lower thorax is made of focal airspace opacity or pleural effusion.  Normal heart size.  No pericardial effusion.  Continued progression of extensive hepatic metastatic disease with dominant mass within the dome of the right lobe the liver now measuring approximately 10.8 x 9.7 cm of image 15, series 2, previously, 9.2 x 8.5 cm.  Near the entirety of the left lobe is expanded and replaced by metastatic  disease with definable partially exophytic nodule arising from the inferior aspect of the medial segment of the left lobe of the liver now measuring approximately 5.4 x 6.2 cm (image 42) previously, 4.1 x 5.1 cm. Hepatomegaly again results in mass effect upon the adjacent organs. The portal vein remains patent.  No ascites.  There is symmetric enhancement and excretion of the bilateral kidneys.  Suspected punctate (110 mm) nonobstructing stone within the superior pole left kidney within the 35, series 2).  No definite right-sided renal stones.  No urinary obstruction or discrete renal lesion.  There is mild diffuse thickening of the left adrenal gland without discrete nodule.  Normal appearance of the right adrenal gland.  Normal appearance of the pancreas and spleen.  Incidental note is made of a small splenule.  Moderate to large colonic stool burden without evidence of obstruction.  No pneumoperitoneum, pneumatosis or portal venous gas.  Scattered atherosclerotic calcifications within a normal caliber abdominal aorta.  The major branch vessels of the abdominal aorta are patent on this non CT examination.  No definite retroperitoneal, mesenteric, pelvic or inguinal lymphadenopathy the evaluation of the pelvis is degraded secondary to streak artifact from the right hemiarthroplasty.  Multiple partially calcified uterine fibroids.  No discrete adnexal lesion.  No free fluid in the pelvis.  Grossly unchanged extensive osseous metastatic disease throughout the imaged thoracolumbar spine with dense sclerotic metastases involving the T9, T10 and T11 vertebral bodies most conspicuously. Post decompressive laminectomies at T9 - T11. Epidural extension of disease is suspected as posterior to the L3 vertebral body (axial image 41, series 2 as well as posterior to the L1 vertebral body (axial image 28, series 2).  Likely interval progression of mixed lytic sclerotic lesion within the left ilium, now measuring 2.6 x 1.7 cm  (image 59, series 2, previously, approximately 2.3 x 1.7 cm. Post right hip hemiarthroplasty.  No definite pathologic fractures.  IMPRESSION: 1.  Continued progression of extensive hepatic metastatic disease with resultant increased hepatomegaly resulting in mass effect upon the adjacent organs. 2.  Moderate to large colonic stool burden without evidence of obstruction. 3.  Grossly unchanged extensive osseous metastatic disease within the thoracolumbar spine with likely unchanged epidural involvement posterior to the L1 and L3 vertebral bodies.  4.  Suspected progression of mixed lytic/sclerotic  lesion within the left ilium.   Original Report Authenticated By: Tacey Ruiz, MD    Dg Abd Acute W/chest  07/07/2012  *RADIOLOGY REPORT*  Clinical Data: Abdominal pain, nausea and vomiting, evaluate for small bowel obstruction and free air  ACUTE ABDOMEN SERIES (ABDOMEN 2 VIEW & CHEST 1 VIEW)  Comparison: 06/15/2012; chest radiograph - 02/15/2012; CT abdomen pelvis - 04/20/2012  Findings:  Grossly unchanged cardiac silhouette and mediastinal contours with mild tortuosity of the thoracic aorta.  No focal parenchymal opacities.  There is persistent mild elevation of the right hemidiaphragm.  No pleural effusion or pneumothorax.  Moderate to large colonic stool burden without evidence of obstruction.  No pneumoperitoneum, pneumatosis or portal venous gas.  Post right hip hemiarthroplasty.  Surgical clips overlie the right axilla. Post right-sided mastectomy.  IMPRESSION: 1.  No acute cardiopulmonary disease. 2.  Moderate to large colonic stool burden without evidence of obstruction.  No pneumoperitoneum.   Original Report Authenticated By: Tacey Ruiz, MD    Dg Abd Acute W/chest  06/15/2012  *RADIOLOGY REPORT*  Clinical Data: Abdominal pain, possible bowel obstruction, weakness  ACUTE ABDOMEN SERIES (ABDOMEN 2 VIEW & CHEST 1 VIEW)  Comparison: Chest x-ray of 02/15/2012 and CT chest of 04/20/2012  Findings: No active  infiltrate or effusion is seen.  Mediastinal contours are stable.  The heart is within normal limits in size.  Supine and erect views of the abdomen show a somewhat fluid distended stomach.  However no other evidence of bowel obstruction is seen.  Is there any clinical suspicion of gastric outlet obstruction?  No free air is seen.  A right hip hemiarthroplasty is noted.  IMPRESSION:  1.  No bowel obstruction.  There is fluid distention of the stomach however and gastric outlet obstruction cannot be excluded.  No free air. 2.  No active lung disease.   Original Report Authenticated By: Dwyane Dee, M.D.     Microbiology: Recent Results (from the past 240 hour(s))  URINE CULTURE     Status: None   Collection Time    07/07/12  9:15 PM      Result Value Range Status   Specimen Description URINE, RANDOM   Final   Special Requests NONE   Final   Culture  Setup Time 07/08/2012 06:00   Final   Colony Count NO GROWTH   Final   Culture NO GROWTH   Final   Report Status 07/09/2012 FINAL   Final     Labs: Basic Metabolic Panel:  Recent Labs Lab 07/07/12 1740 07/08/12 0454  NA 140 140  K 2.9* 3.7  CL 100 103  CO2 25 25  GLUCOSE 88 97  BUN 5* 5*  CREATININE 0.70 0.66  CALCIUM 9.2 8.8   Liver Function Tests:  Recent Labs Lab 07/07/12 1740  AST 34  ALT 9  ALKPHOS 358*  BILITOT 0.5  PROT 7.6  ALBUMIN 3.4*    Recent Labs Lab 07/07/12 1740  LIPASE 12   No results found for this basename: AMMONIA,  in the last 168 hours CBC:  Recent Labs Lab 07/07/12 1740 07/08/12 0454  WBC 5.6 4.5  NEUTROABS 4.1  --   HGB 10.1* 9.1*  HCT 30.9* 27.9*  MCV 82.0 82.5  PLT 388 336   Cardiac Enzymes: No results found for this basename: CKTOTAL, CKMB, CKMBINDEX, TROPONINI,  in the last 168 hours BNP: BNP (last 3 results) No results found for this basename: PROBNP,  in the last 8760 hours CBG:  No results found for this basename: GLUCAP,  in the last 168 hours     Signed:  Penny Pia  [facility] 07/10/2012, 9:38 AM

## 2012-07-10 NOTE — Care Management Note (Signed)
   CARE MANAGEMENT NOTE 07/10/2012  Patient:  [name], [name]   Account Number:  [id number]  Date Initiated:  07/08/2012  Documentation initiated by:  [name]  Subjective/Objective Assessment:   ADMITTED W/ACUTE ABD PAIN.ON:GEXBMW CA,METS BONE,LIVER.     Action/Plan:   FROM HOME W/KIDS.   Anticipated DC Date:  07/10/2012   Anticipated DC Plan:  HOME W HOSPICE CARE      DC Planning Services  CM consult      PAC Choice  HOSPICE   Choice offered to / List presented to:  C-1 Patient           HH agency  [facility]   Status of service:  In process, will continue to follow Medicare Important Message given?   (If response is "NO", the following Medicare IM given date fields will be blank) Date Medicare IM given:   Date Additional Medicare IM given:    Discharge Disposition:  HOME W HOSPICE CARE  Per UR Regulation:  Reviewed for med. necessity/level of care/duration of stay  If discussed at Long Length of Stay Meetings, dates discussed:    Comments:  07/10/12 PATIENT TO DISCHARGE HOME TODAY WITH [facility]. FAMILY TO TAKE PATIENT HOME. ORDERS AND NOTES WERE FAXED ON 07/09/12 TO [facility] WITH FAXED CONFIRMATION RECEIVED. TODAY FAXED DC SUMMARY TO FAX NUMBER [phone] [facility] FAX LINE WITH FAXED CONFIRMATION RECEIVED. GAVE PATIENT THE NUMBER [phone] TO CALL ONCE THEY GET HOME SO THE ADMISSIONS NURSE CAN COME DO ASSESSMENT. PATIENT REPORTS ADMISSIONS NURSE FROM [facility] HAS ALREADY CONTACTED THEM TO SEE WHEN PATIENT WILL ARRIVE HOME. NO FURTHER NEEDS ASSESSED. [name], [id number]   07/09/12 Received referral to offer home hospice choice. Spoke with patient and family at bedside to discuss hospice choices. [facility] chose [facility] for hospice services. Patient to discharge home tomorrow. Confirmed contact information as being patient's cell number [phone], [name] (sister)  [phone], and [place] (sister) [phone]. Confirmed patient's daughter will be staying with her at home 24/7 post discharge. Call made to [facility] triage nurse to make aware of discharge home tomorrow with hospice services. Made aware that discharge early on 07-10-12 is needed to facilitate to hospice services at home. Spoke with Dr Cena Benton about hospice services being arranged. Will fax orders, notes and etc to [facility] at [phone] referral center. [name], RNCM [phone]     07/08/12 Andrea MAHABIR RN,BSN NCM [phone]

## 2012-07-10 NOTE — Progress Notes (Signed)
At this time, pt. Is taken to her vehicle without incident after receiving, and signing for d/c instructions and prescriptions.  She remains in no distress. She thanks [place] for our care.  All of her questions and her family's questions are fielded by me.

## 2012-07-11 ENCOUNTER — Other Ambulatory Visit: Payer: Self-pay | Admitting: Oncology

## 2012-07-11 ENCOUNTER — Other Ambulatory Visit (HOSPITAL_BASED_OUTPATIENT_CLINIC_OR_DEPARTMENT_OTHER): Payer: Medicaid Other | Admitting: Lab

## 2012-07-11 ENCOUNTER — Other Ambulatory Visit: Payer: Self-pay | Admitting: *Deleted

## 2012-07-11 ENCOUNTER — Ambulatory Visit (HOSPITAL_BASED_OUTPATIENT_CLINIC_OR_DEPARTMENT_OTHER): Payer: Medicaid Other | Admitting: Physician Assistant

## 2012-07-11 DIAGNOSIS — C50919 Malignant neoplasm of unspecified site of unspecified female breast: Secondary | ICD-10-CM

## 2012-07-11 DIAGNOSIS — C7951 Secondary malignant neoplasm of bone: Secondary | ICD-10-CM

## 2012-07-11 DIAGNOSIS — C7931 Secondary malignant neoplasm of brain: Secondary | ICD-10-CM

## 2012-07-11 DIAGNOSIS — C787 Secondary malignant neoplasm of liver and intrahepatic bile duct: Secondary | ICD-10-CM

## 2012-07-11 DIAGNOSIS — C7949 Secondary malignant neoplasm of other parts of nervous system: Secondary | ICD-10-CM

## 2012-07-11 DIAGNOSIS — C50519 Malignant neoplasm of lower-outer quadrant of unspecified female breast: Secondary | ICD-10-CM

## 2012-07-11 LAB — CBC WITH DIFFERENTIAL/PLATELET
Basophils Absolute: 0 10*3/uL (ref 0.0–0.1)
EOS%: 1.9 % (ref 0.0–7.0)
Eosinophils Absolute: 0.1 10*3/uL (ref 0.0–0.5)
HGB: 9.7 g/dL — ABNORMAL LOW (ref 11.6–15.9)
LYMPH%: 21.5 % (ref 14.0–49.7)
MCH: 27.9 pg (ref 25.1–34.0)
MCV: 82.3 fL (ref 79.5–101.0)
MONO%: 4.2 % (ref 0.0–14.0)
NEUT#: 2.9 10*3/uL (ref 1.5–6.5)
Platelets: 311 10*3/uL (ref 145–400)

## 2012-07-11 LAB — COMPREHENSIVE METABOLIC PANEL (CC13)
Alkaline Phosphatase: 401 U/L — ABNORMAL HIGH (ref 40–150)
BUN: 9.5 mg/dL (ref 7.0–26.0)
Creatinine: 0.9 mg/dL (ref 0.6–1.1)
Glucose: 140 mg/dl — ABNORMAL HIGH (ref 70–99)
Total Bilirubin: 0.38 mg/dL (ref 0.20–1.20)

## 2012-07-11 MED ORDER — OXYCODONE HCL ER 10 MG PO T12A: 10.0000 mg | EXTENDED_RELEASE_TABLET | Freq: Three times a day (TID) | ORAL | Status: DC | PRN

## 2012-07-11 NOTE — Progress Notes (Signed)
ID: [name]   DOB: 1965-06-22  MR#: [id number]  WUJ#:[id number]  HISTORY OF PRESENT ILLNESS: The patient delivered a child on 05/17/2009, after which she felt fullness in the right breast. She eventually brought this to her doctors attention and was scheduled for mammography and ultrasound on 03/08/2009. There were no prior exams for comparison. Mammography showed the breast to be dense, with a 5 cm obscured mass in the outer midportion of the right breast. Mass was easily palpable, and there was also a mass in the outer right periareolar region. They were suspicious microcalcifications in the outer midportion of the right breast, as well as within the mass itself. Ultrasound showed an extensive heterogeneous area, corresponding to the palpable abnormality with multiple solid and cystic foci. Overall, the mass measured in excess of 5 cm, in addition to cysts in the breast.  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 The tumors were ER +100%, PR +80%.  Bilateral breast MRI was obtained 03/22/2009. There was a large area of abnormal enhancement in the lower outer quadrant of the right breast measuring up to 9.7 cm. No abnormal appearing lymph nodes and no other masses in either breast.  Patient underwent definitive right mastectomy and sentinel lymph node sampling on 04/29/2009 under the care of Dr. Derrell Lolling. 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 Repeat prognostic panel showed tumor to be ER +93% and PR +51%. HER-2/neu was negative with a ratio of 0.71, with proliferation marker of 36%.  Patient was evaluated by Dr. Darnelle Catalan in January 2011 but failed to keep appointments until she was seen here again in July 2011. In July, she began adjuvant chemotherapy. She received 4 cycles of dose dense  doxorubicin and cyclophosphamide followed by 4 weekly doses of paclitaxel which was discontinued in November 2011 secondary to peripheral neuropathy. The patient then received radiation therapy to the right chest wall which was truncated due to problems with missing appointments.  Patient was started on tamoxifen briefly in June of 2012 which was discontinued due to nausea. The nausea continued, and an abdominal CT in October 2012 confirmed a growth in liver lesions. These were biopsied on 03/10/2011 and showed metastatic adenocarcinoma, still ER positive at 100%, PR +5%. Her subsequent treatments are as detailed below  INTERVAL HISTORY: Sharelle was discharged from the hospital yesterday. Her appointment today was supposed to have been canceled, but it wasn't done she showed up. It actually is a good thing that she did, because she is having trouble getting her pain medication filled. I think the problem is she is trying to use [facility] in status taking with benefits pharmacy. This is discussed further below. She is here with her daughter and grandson.  REVIEW OF SYSTEMS: Her pain is better controlled. She still having loose bowel movements, but "that's getting better". She is walking with a cane. She really feels that would help her the most would be a CNA a few days a week "to give my daughter break". She is having no unusual headaches, nausea, vomiting, cough, or phlegm production. Bladder function is unchanged. A detailed review of systems was otherwise stable  PAST MEDICAL HISTORY: Past Medical History  Diagnosis Date  . Hypertension   . GERD (gastroesophageal reflux disease)   . 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 Pyelonephritis 10/01/11  .  Recurrent UTI (urinary tract infection) 10/01/11  . S/P chemotherapy, time since 4-12 weeks   . S/P radiation > 12 weeks   . 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 Brain cancer     spread from breast ca  .  Hypercalcemia 06/21/2012    PAST SURGICAL HISTORY: Past Surgical History  Procedure Laterality Date  . Cesarean section  1989; 2010  . Mastectomy  2011    right  . Back surgery  2012    "removed tumor,cancer, from my spine  . Port-a-cath removal  2012    right chest  . Portacath placement  2011    right  . Tubal ligation  2010  . Breast biopsy  2011    right  . Hip arthroplasty  05/04/2012    Procedure: ARTHROPLASTY BIPOLAR HIP;  Surgeon: Shelda Pal, MD;  Location: [facility];  Service: Orthopedics;  Laterality: Right;  RIGHT HIP HEMI ARTHROPLASTY     FAMILY HISTORY The patient's father is alive at age 1.  The patient's mother is alive at age 69.  She has four sisters.  She had one brother who died from colon cancer at the age of 68.  There is no breast or ovarian cancer history in the family to her knowledge.  GYNECOLOGIC HISTORY: She is GX P2.  First pregnancy to term at age 58.    SOCIAL HISTORY: The patient is not employed.  She lives by herself with her small son whose name is [name] (he is currently staying with his dad in [place]).  Her first child, [name], is studying business in this area.  The patient has one grandchild.  She attends the [employer].    ADVANCED DIRECTIVES: not in place  HEALTH MAINTENANCE: History  Substance Use Topics  . Smoking status: Former Smoker -- 0.25 packs/day for 31 years    Types: Cigarettes    Quit date: 04/26/2012  . Smokeless tobacco: Never Used  . Alcohol Use: No     Colonoscopy:  PAP:  Bone density:  Lipid panel:  No Known Allergies  Current Outpatient Prescriptions  Medication Sig Dispense Refill  . docusate sodium (COLACE) 100 MG capsule Take 1 capsule (100 mg total) by mouth 2 (two) times daily as needed for constipation.  10 capsule  0  . everolimus (AFINITOR) 5 MG tablet Take 5 mg by mouth daily.      [name] exemestane (AROMASIN) 25 MG tablet Take 25 mg by mouth daily after breakfast.      . ferrous sulfate 325  (65 FE) MG tablet Take 325 mg by mouth 3 (three) times daily after meals.      . ondansetron (ZOFRAN) 4 MG tablet Take 4 mg by mouth every 6 (six) hours.      [name] oxyCODONE (OXY IR/ROXICODONE) 5 MG immediate release tablet Take 1-2 tablets (5-10 mg total) by mouth every 6 (six) hours as needed.  30 tablet  0  . OxyCODONE (OXYCONTIN) 10 mg T12A Take 1 tablet (10 mg total) by mouth 3 (three) times daily as needed.  90 tablet  0  . oxyCODONE-acetaminophen (PERCOCET/ROXICET) 5-325 MG per tablet Take 1-2 tablets by mouth every 6 (six) hours as needed for pain.  30 tablet  0  . sennosides-docusate sodium (SENOKOT-S) 8.6-50 MG tablet Take 1 tablet by mouth daily as needed for constipation.  20 tablet  0  . [DISCONTINUED] Alum & Mag Hydroxide-Simeth (MAGIC MOUTHWASH W/LIDOCAINE) SOLN Take 5  mLs by mouth 3 (three) times daily as needed (take 15 minutes prior to meals.).  480 mL  0   No current facility-administered medications for this visit.    OBJECTIVE: Middle-aged [place] American woman   There were no vitals filed for this visit.   There is no weight on file to calculate BMI.   There were no vitals filed for this visit.  LAB RESULTS: Lab Results  Component Value Date   WBC 4.0 07/11/2012   NEUTROABS 2.9 07/11/2012   HGB 9.7* 07/11/2012   HCT 28.6* 07/11/2012   MCV 82.3 07/11/2012   PLT 311 07/11/2012      Chemistry      Component Value Date/Time   NA 140 07/08/2012 0454   NA 137 06/21/2012 1123   K 3.7 07/08/2012 0454   K 4.6 06/21/2012 1123   CL 103 07/08/2012 0454   CL 95* 06/21/2012 1123   CO2 25 07/08/2012 0454   CO2 29 06/21/2012 1123   BUN 5* 07/08/2012 0454   BUN 19.0 06/21/2012 1123   CREATININE 0.66 07/08/2012 0454   CREATININE 1.4* 06/21/2012 1123      Component Value Date/Time   CALCIUM 8.8 07/08/2012 0454   CALCIUM 10.6* 06/21/2012 1123   ALKPHOS 358* 07/07/2012 1740   ALKPHOS 302* 06/21/2012 1123   AST 34 07/07/2012 1740   AST 48* 06/21/2012 1123   ALT 9 07/07/2012 1740   ALT 10 06/21/2012  1123   BILITOT 0.5 07/07/2012 1740   BILITOT 0.68 06/21/2012 1123       Lab Results  Component Value Date   LABCA2 89* 06/09/2012    STUDIES:  Dg Thoracic Spine 2 View  05/31/2012  *RADIOLOGY REPORT*  Clinical Data: Back pain and weakness.  THORACIC SPINE - 2 VIEW  Comparison: CT scan 04/20/2012.  Findings: The thoracic vertebral bodies are normally aligned.  No acute compression fracture.  Stable sclerotic metastatic bone disease.  IMPRESSION:  1.  No acute bony findings. 2.  Stable sclerotic metastatic bone disease.   Original Report Authenticated By: Rudie Meyer, M.D.    Dg Lumbar Spine Complete  05/31/2012  *RADIOLOGY REPORT*  Clinical Data: Breast cancer with osseous metastatic disease.  Back pain.  LUMBAR SPINE - COMPLETE 4+ VIEW  Comparison: Multiple exams, including 04/20/2012 and 03/18/2012  Findings: T9-T11 laminectomy noted, with bony sclerosis in this vicinity compatible with the vertebral metastatic disease.  Lateral demineralization of the right L1 pedicle and sclerosis of the right L2 pedicle likely reflect the underlying malignancy, but overall the osseous metastatic disease is less apparent than on the prior CT scan.  No interval collapse or subluxation.  IMPRESSION:  1.  Reduced conspicuity of the osseous metastatic lesions at L1-L3 compared to prior CT scan, although demineralization of the right L1 pedicle and sclerosis of the right L2 pedicle are compatible with metastatic disease. 2.  Sclerosis in the lower thoracic spine compatible with metastatic disease, with T9-T11 laminectomy noted. 3.  No interval vertebral body collapse or subluxation.   Original Report Authenticated By: Gaylyn Rong, M.D.    Ct Head Wo Contrast  06/15/2012  *RADIOLOGY REPORT*  Clinical Data: Headache.  Breast cancer.  Nausea.  The  CT HEAD WITHOUT CONTRAST  Technique:  Contiguous axial images were obtained from the base of the skull through the vertex without contrast.  Comparison: 02/20/2012.   02/15/2012.  Findings: The brain has a normal appearance without evidence of atrophy, old or acute infarction, mass lesion, hemorrhage, hydrocephalus or  extra-axial collection.  Previously seen destructive lesion of the clivus with extension into the inferior sphenoid sinus has been treated and appears healed.  No ongoing destructive process is evident in the skull base or anywhere in the calvarium.  Visualized sinuses, middle ears and mastoids are clear.  IMPRESSION: No intracranial abnormality.  Treatment of a previous clival metastasis.  No evidence of residual or recurrent active tumor in that region.   Original Report Authenticated By: Paulina Fusi, M.D.    Dg Abd Acute W/chest  06/15/2012  *RADIOLOGY REPORT*  Clinical Data: Abdominal pain, possible bowel obstruction, weakness  ACUTE ABDOMEN SERIES (ABDOMEN 2 VIEW & CHEST 1 VIEW)  Comparison: Chest x-ray of 02/15/2012 and CT chest of 04/20/2012  Findings: No active infiltrate or effusion is seen.  Mediastinal contours are stable.  The heart is within normal limits in size.  Supine and erect views of the abdomen show a somewhat fluid distended stomach.  However no other evidence of bowel obstruction is seen.  Is there any clinical suspicion of gastric outlet obstruction?  No free air is seen.  A right hip hemiarthroplasty is noted.  IMPRESSION:  1.  No bowel obstruction.  There is fluid distention of the stomach however and gastric outlet obstruction cannot be excluded.  No free air. 2.  No active lung disease.   Original Report Authenticated By: Dwyane Dee, M.D.    ASSESSMENT:   47 y.o.  [place] woman    1. 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    2.Status post adjuvant chemotherapy July to November 2011, consisting of 4 cycles of dose dense doxorubicin and  cyclophosphamide, followed by 4 of 12 planned weekly doses of paclitaxel given adjuvantly, discontinued secondary to peripheral neuropathy.    3. Status post radiation to the right chest wall, truncated due to problems with the patient missing appointments.   4. Tamoxifen started June of 2012, but discontinued due to nausea.    5. metastatic disease to liver and bone pathologically documented October of 2012, estrogen receptor 100% positive, progesterone receptor 7% positive, with no HER-2 amplification;  treatment in the metastatic setting has consisted of:   6.Faslodex started 03/16/2011, discontinued November 2013 with progression. Zoladex every 3 months also started on 03/16/2011, and monthly zoledronic acid started November 2011, the Zometa I currently held due to pending dental procedures and possible extractions.             7. Status post thoracic laminectomy, T9-T11, for impending cord compression on 03/21/2011, followed by irradiation to the spine completed November 2011  8. 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 s/p palliative radiation to the Right hip clivus to 30 Gy completed 04/07/2012  10.  Progression noted by scans in November 2013, at which time Faslodex was discontinued. Patient was started on exemestane in early December, with everolimus started in mid December at 5 mg daily. Continues to receive Zoladex injections every 3 months.  11. Nondisplaced right femoral neck fracture, status post right hip arthroplasty on 05/04/2012 under the care of Dr.Matt [facility].  12. Hypercalcemia with elevated serum creatinine: resolved       PLAN:  I have discussed her situation with Dr. Gibson Ramp from hospice. He does she is receiving life-prolonging therapy,  not directly type to symptom control, she does not currently qualify for hospice support. I have discussed with  the patient that if after 2 more months there is no evidence of response, we would stop the definitive or, and consider chemotherapy. She understands the chemotherapy would be unlikely to cause a significant response, and therefore that point likely she would prefer a purely palliative care and would meet hospice criteria.  For now we are continuing the everolimus and exemestane, and we will set her up for restaging studies in approximately 2 months. In the meantime we are trying to set her up with a CNA at home through the [facility] home [facility]. The appropriate papers are being feel that today.  In addition I filled out and out of facility DO NOT RESUSCITATE order today for her. She knows to call for any problems that may develop before the next visit.  Tramya Schoenfelder C    07/11/2012

## 2012-07-13 NOTE — Consult Note (Signed)
Agree with above 

## 2012-07-14 ENCOUNTER — Telehealth: Payer: Self-pay | Admitting: Oncology

## 2012-07-14 NOTE — Telephone Encounter (Signed)
S/w the pt and she is aware of her appts on 07/22/2012.

## 2012-07-21 ENCOUNTER — Encounter (HOSPITAL_COMMUNITY): Payer: Self-pay | Admitting: Emergency Medicine

## 2012-07-21 ENCOUNTER — Observation Stay (HOSPITAL_COMMUNITY)
Admission: EM | Admit: 2012-07-21 | Discharge: 2012-07-23 | Disposition: A | Discharge status: Home / Self Care | Payer: Medicaid Other | Attending: Internal Medicine | Admitting: Internal Medicine

## 2012-07-21 ENCOUNTER — Emergency Department (HOSPITAL_COMMUNITY): Payer: Medicaid Other

## 2012-07-21 DIAGNOSIS — C719 Malignant neoplasm of brain, unspecified: Secondary | ICD-10-CM

## 2012-07-21 DIAGNOSIS — I10 Essential (primary) hypertension: Secondary | ICD-10-CM

## 2012-07-21 DIAGNOSIS — R52 Pain, unspecified: Secondary | ICD-10-CM | POA: Diagnosis present

## 2012-07-21 DIAGNOSIS — C801 Malignant (primary) neoplasm, unspecified: Secondary | ICD-10-CM

## 2012-07-21 DIAGNOSIS — Z79899 Other long term (current) drug therapy: Secondary | ICD-10-CM | POA: Insufficient documentation

## 2012-07-21 DIAGNOSIS — R109 Unspecified abdominal pain: Secondary | ICD-10-CM

## 2012-07-21 DIAGNOSIS — C7952 Secondary malignant neoplasm of bone marrow: Secondary | ICD-10-CM

## 2012-07-21 DIAGNOSIS — Z923 Personal history of irradiation: Secondary | ICD-10-CM

## 2012-07-21 DIAGNOSIS — C50919 Malignant neoplasm of unspecified site of unspecified female breast: Secondary | ICD-10-CM

## 2012-07-21 DIAGNOSIS — K219 Gastro-esophageal reflux disease without esophagitis: Secondary | ICD-10-CM

## 2012-07-21 DIAGNOSIS — Z87891 Personal history of nicotine dependence: Secondary | ICD-10-CM | POA: Insufficient documentation

## 2012-07-21 DIAGNOSIS — C7931 Secondary malignant neoplasm of brain: Secondary | ICD-10-CM | POA: Insufficient documentation

## 2012-07-21 DIAGNOSIS — C7949 Secondary malignant neoplasm of other parts of nervous system: Secondary | ICD-10-CM

## 2012-07-21 DIAGNOSIS — Z9221 Personal history of antineoplastic chemotherapy: Secondary | ICD-10-CM | POA: Insufficient documentation

## 2012-07-21 DIAGNOSIS — C7951 Secondary malignant neoplasm of bone: Secondary | ICD-10-CM

## 2012-07-21 DIAGNOSIS — M549 Dorsalgia, unspecified: Secondary | ICD-10-CM

## 2012-07-21 DIAGNOSIS — K5900 Constipation, unspecified: Secondary | ICD-10-CM

## 2012-07-21 DIAGNOSIS — M546 Pain in thoracic spine: Principal | ICD-10-CM | POA: Insufficient documentation

## 2012-07-21 DIAGNOSIS — D649 Anemia, unspecified: Secondary | ICD-10-CM

## 2012-07-21 DIAGNOSIS — C787 Secondary malignant neoplasm of liver and intrahepatic bile duct: Secondary | ICD-10-CM | POA: Insufficient documentation

## 2012-07-21 LAB — CBC WITH DIFFERENTIAL/PLATELET
Eosinophils Absolute: 0 10*3/uL (ref 0.0–0.7)
HCT: 28.9 % — ABNORMAL LOW (ref 36.0–46.0)
Hemoglobin: 9.1 g/dL — ABNORMAL LOW (ref 12.0–15.0)
Lymphs Abs: 1 10*3/uL (ref 0.7–4.0)
MCH: 25.3 pg — ABNORMAL LOW (ref 26.0–34.0)
MCV: 80.3 fL (ref 78.0–100.0)
Monocytes Absolute: 0.2 10*3/uL (ref 0.1–1.0)
Monocytes Relative: 5 % (ref 3–12)
Neutrophils Relative %: 72 % (ref 43–77)
RBC: 3.6 MIL/uL — ABNORMAL LOW (ref 3.87–5.11)

## 2012-07-21 LAB — POCT I-STAT, CHEM 8
Creatinine, Ser: 0.9 mg/dL (ref 0.50–1.10)
Glucose, Bld: 95 mg/dL (ref 70–99)
Hemoglobin: 9.9 g/dL — ABNORMAL LOW (ref 12.0–15.0)
Potassium: 3.8 mEq/L (ref 3.5–5.1)

## 2012-07-21 MED ORDER — SENNOSIDES-DOCUSATE SODIUM 8.6-50 MG PO TABS
1.0000 | ORAL_TABLET | Freq: Every day | ORAL | Status: DC | PRN
  Filled 2012-07-21: qty 1

## 2012-07-21 MED ORDER — ONDANSETRON HCL 4 MG/2ML IJ SOLN: 4.0000 mg | Freq: Four times a day (QID) | INTRAMUSCULAR | Status: DC | PRN

## 2012-07-21 MED ORDER — OXYCODONE HCL ER 10 MG PO T12A
10.0000 mg | EXTENDED_RELEASE_TABLET | Freq: Three times a day (TID) | ORAL | Status: DC | PRN
  Filled 2012-07-21: qty 1

## 2012-07-21 MED ORDER — SODIUM CHLORIDE 0.9 % IV SOLN
INTRAVENOUS | Status: DC
  Administered 2012-07-21: 100 mL/h via INTRAVENOUS
  Administered 2012-07-22 – 2012-07-23 (×3): via INTRAVENOUS

## 2012-07-21 MED ORDER — HYDROMORPHONE HCL PF 2 MG/ML IJ SOLN
2.0000 mg | Freq: Once | INTRAMUSCULAR | Status: AC
  Administered 2012-07-21: 2 mg via INTRAVENOUS
  Filled 2012-07-21: qty 1

## 2012-07-21 MED ORDER — SODIUM CHLORIDE 0.9 % IV SOLN: 250.0000 mL | INTRAVENOUS | Status: DC | PRN

## 2012-07-21 MED ORDER — MORPHINE SULFATE 2 MG/ML IJ SOLN
1.0000 mg | INTRAMUSCULAR | Status: DC | PRN
  Administered 2012-07-22 (×3): 1 mg via INTRAVENOUS
  Filled 2012-07-21 (×3): qty 1

## 2012-07-21 MED ORDER — OXYCODONE HCL ER 15 MG PO T12A
15.0000 mg | EXTENDED_RELEASE_TABLET | Freq: Two times a day (BID) | ORAL | Status: DC
  Administered 2012-07-21 – 2012-07-23 (×4): 15 mg via ORAL
  Filled 2012-07-21 (×4): qty 1

## 2012-07-21 MED ORDER — EXEMESTANE 25 MG PO TABS
25.0000 mg | ORAL_TABLET | Freq: Every day | ORAL | Status: DC
  Administered 2012-07-22 – 2012-07-23 (×2): 25 mg via ORAL
  Filled 2012-07-21 (×4): qty 1

## 2012-07-21 MED ORDER — ONDANSETRON HCL 4 MG PO TABS: 4.0000 mg | ORAL_TABLET | Freq: Four times a day (QID) | ORAL | Status: DC | PRN

## 2012-07-21 MED ORDER — SODIUM CHLORIDE 0.9 % IJ SOLN: 3.0000 mL | INTRAMUSCULAR | Status: DC | PRN

## 2012-07-21 MED ORDER — ONDANSETRON HCL 4 MG/2ML IJ SOLN
4.0000 mg | Freq: Once | INTRAMUSCULAR | Status: AC
  Administered 2012-07-21: 4 mg via INTRAVENOUS
  Filled 2012-07-21: qty 2

## 2012-07-21 MED ORDER — MORPHINE SULFATE 2 MG/ML IJ SOLN
INTRAMUSCULAR | Status: AC
  Administered 2012-07-21: 1 mg via INTRAVENOUS
  Filled 2012-07-21: qty 1

## 2012-07-21 MED ORDER — CYCLOBENZAPRINE HCL 10 MG PO TABS
10.0000 mg | ORAL_TABLET | Freq: Three times a day (TID) | ORAL | Status: DC | PRN
  Administered 2012-07-23: 10 mg via ORAL
  Filled 2012-07-21: qty 1

## 2012-07-21 MED ORDER — FERROUS SULFATE 325 (65 FE) MG PO TABS
325.0000 mg | ORAL_TABLET | Freq: Three times a day (TID) | ORAL | Status: DC
  Administered 2012-07-22 – 2012-07-23 (×3): 325 mg via ORAL
  Filled 2012-07-21 (×7): qty 1

## 2012-07-21 MED ORDER — SODIUM CHLORIDE 0.9 % IV BOLUS (SEPSIS)
1000.0000 mL | Freq: Once | INTRAVENOUS | Status: AC
  Administered 2012-07-21: 1000 mL via INTRAVENOUS

## 2012-07-21 MED ORDER — EVEROLIMUS 5 MG PO TABS
5.0000 mg | ORAL_TABLET | Freq: Every day | ORAL | Status: DC
  Administered 2012-07-22 – 2012-07-23 (×2): 5 mg via ORAL

## 2012-07-21 MED ORDER — GADOBENATE DIMEGLUMINE 529 MG/ML IV SOLN
12.0000 mL | Freq: Once | INTRAVENOUS | Status: AC | PRN
  Administered 2012-07-21: 12 mL via INTRAVENOUS

## 2012-07-21 MED ORDER — SODIUM CHLORIDE 0.9 % IJ SOLN
3.0000 mL | Freq: Two times a day (BID) | INTRAMUSCULAR | Status: DC
  Administered 2012-07-21: 3 mL via INTRAVENOUS

## 2012-07-21 MED ORDER — DOCUSATE SODIUM 100 MG PO CAPS
100.0000 mg | ORAL_CAPSULE | Freq: Two times a day (BID) | ORAL | Status: DC | PRN
  Filled 2012-07-21: qty 1

## 2012-07-21 MED ORDER — SENNA-DOCUSATE SODIUM 8.6-50 MG PO TABS: 1.0000 | ORAL_TABLET | Freq: Every day | ORAL | Status: DC | PRN

## 2012-07-21 MED ORDER — LORAZEPAM 2 MG/ML IJ SOLN
1.0000 mg | Freq: Once | INTRAMUSCULAR | Status: AC
  Administered 2012-07-21: 1 mg via INTRAVENOUS
  Filled 2012-07-21: qty 1

## 2012-07-21 MED ORDER — OXYCODONE HCL 5 MG PO TABS
5.0000 mg | ORAL_TABLET | Freq: Four times a day (QID) | ORAL | Status: DC | PRN
  Administered 2012-07-21 – 2012-07-22 (×2): 10 mg via ORAL
  Filled 2012-07-21 (×2): qty 2

## 2012-07-21 NOTE — ED Notes (Signed)
Off floor for testing 

## 2012-07-21 NOTE — ED Notes (Signed)
Per patient, states right hip pain, and back pain-increased pain since last night-states new pain

## 2012-07-21 NOTE — H&P (Signed)
[facility] History and Physical  JAZMYNN PHO NWG:[id number] DOB: 01-25-1966 DOA: 07/21/2012  Referring physician: Dr. Lynelle Doctor PCP: No primary provider on file.  Specialists: Dr. Luanna Cole (oncology for h/o breast ca)  Chief Complaint: Back pain  HPI: [name] is a 47 y.o. female  With h/o metastatic breast cancer to liver and thoracic vertebrae (who underwent thoracic laminectomy by Dr. 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  Reportedly patient had excellend decompression from t9-t11.  Patient was recently at hospital and discharged home with home hospice.  Given her increasing back pain she was decided to come back to the hospital for further evaluation and recommendations.  She denies any bowel or bladder incontinence or weakness in her lower extremities.  While in the ED patient had repeat MRI which showed excellent decompression from T9-T11 she has new epidural spread of tumor from T4-T7 with some cord compression with signal change in the cord.  Neurosurgeon was consulted and recommended a radiation oncology consult for further evaluation and recommendations as patient in his opinion was a poor surgical candidate   Review of Systems: 10 point review of systems reviewed and negative unless otherwise mentioned above.  Past Medical History  Diagnosis Date  . Hypertension   . GERD (gastroesophageal reflux disease)   . 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 Pyelonephritis 10/01/11  . Recurrent UTI (urinary tract infection) 10/01/11  . S/P chemotherapy, time since 4-12 weeks   . S/P radiation > 12 weeks   . 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 Brain cancer     spread from breast ca  . Hypercalcemia 06/21/2012   Past Surgical History  Procedure Laterality Date  . Cesarean section  1989; 2010  . Mastectomy  2011    right  . Back surgery  2012    "removed tumor,cancer, from my spine  . Port-a-cath  removal  2012    right chest  . Portacath placement  2011    right  . Tubal ligation  2010  . Breast biopsy  2011    right  . Hip arthroplasty  05/04/2012    Procedure: ARTHROPLASTY BIPOLAR HIP;  Surgeon: Shelda Pal, MD;  Location: [facility];  Service: Orthopedics;  Laterality: Right;  RIGHT HIP HEMI ARTHROPLASTY    Social History:  reports that she quit smoking about 2 months ago. Her smoking use included Cigarettes. She has a 7.75 pack-year smoking history. She has never used smokeless tobacco. She reports that she does not drink alcohol or use illicit drugs.  where does patient live--home, ALF, SNF? and with whom if at home? Lives at home  Can patient participate in ADLs? yes  No Known Allergies  Family History  Problem Relation Age of Onset  . Cancer Brother 35    died of colon cancer at age of 20  . High blood pressure Mother   none other reported.  Prior to Admission medications   Medication Sig Start Date End Date Taking? Authorizing Provider  cyclobenzaprine (FLEXERIL) 10 MG tablet Take 10 mg by mouth 3 (three) times daily as needed for muscle spasms.   Yes Historical Provider, MD  docusate sodium (COLACE) 100 MG capsule Take 1 capsule (100 mg total) by mouth 2 (two) times daily as needed for constipation. 07/10/12  Yes Penny Pia, MD  everolimus (AFINITOR) 5 MG tablet Take 5 mg by mouth daily. 06/09/12  Yes  Catalina Gravel, PA  exemestane (AROMASIN) 25 MG tablet Take 25 mg by mouth daily after breakfast. 05/03/12  Yes Lowella Dell, MD  ferrous sulfate 325 (65 FE) MG tablet Take 325 mg by mouth 3 (three) times daily after meals. 05/06/12  Yes Genelle Gather Babish, PA  ondansetron (ZOFRAN) 4 MG tablet Take 4 mg by mouth every 6 (six) hours. 06/15/12  Yes Roxy Horseman, PA-C  oxyCODONE (OXY IR/ROXICODONE) 5 MG immediate release tablet Take 1-2 tablets (5-10 mg total) by mouth every 6 (six) hours as needed. 07/10/12  Yes Penny Pia, MD  OxyCODONE (OXYCONTIN) 10 mg T12A Take 1  tablet (10 mg total) by mouth 3 (three) times daily as needed. 07/11/12  Yes Lowella Dell, MD  oxyCODONE-acetaminophen (PERCOCET/ROXICET) 5-325 MG per tablet Take 1-2 tablets by mouth every 6 (six) hours as needed for pain. 07/10/12  Yes Penny Pia, MD  sennosides-docusate sodium (SENOKOT-S) 8.6-50 MG tablet Take 1 tablet by mouth daily as needed for constipation. 07/10/12  Yes Penny Pia, MD   Physical Exam: Filed Vitals:   07/21/12 1441 07/21/12 1943  BP: 174/90 141/86  Pulse: 102 102  Temp: 98.2 F (36.8 C) 98.5 F (36.9 C)  TempSrc:  Oral  Resp: 20 14  SpO2: 100% 99%     General:  Pt in NAD, Alert and Awake  Eyes: EOMI, normal exterior appearance  ENT: moist mucous membranes, no masses on visual examination  Neck: supple, no goiter  Cardiovascular: RRR, NO MRG  Respiratory: CTA BL, no wheezes  Abdomen: soft, some diffuse tenderness no guarding, + hepatomegaly   Skin: warm and dry  Musculoskeletal: no cyanosis or clubbing  Psychiatric: flat affect  Neurologic: answers questions appropriately, moves all extremities.  Sensation to light touch intact at inner thighs, strength 5/5 at lower extremities equally  Labs on Admission:  Basic Metabolic Panel:  Recent Labs Lab 07/21/12 1604  NA 140  K 3.8  CL 107  GLUCOSE 95  BUN 8  CREATININE 0.90   Liver Function Tests: No results found for this basename: AST, ALT, ALKPHOS, BILITOT, PROT, ALBUMIN,  in the last 168 hours No results found for this basename: LIPASE, AMYLASE,  in the last 168 hours No results found for this basename: AMMONIA,  in the last 168 hours CBC:  Recent Labs Lab 07/21/12 1500 07/21/12 1604  WBC 4.7  --   NEUTROABS 3.4  --   HGB 9.1* 9.9*  HCT 28.9* 29.0*  MCV 80.3  --   PLT 474*  --    Cardiac Enzymes: No results found for this basename: CKTOTAL, CKMB, CKMBINDEX, TROPONINI,  in the last 168 hours  BNP (last 3 results) No results found for this basename: PROBNP,  in the last  8760 hours CBG: No results found for this basename: GLUCAP,  in the last 168 hours  Radiological Exams on Admission: Mr Thoracic Spine W Wo Contrast  07/21/2012  *RADIOLOGY REPORT*  Clinical Data: Breast cancer with recurrent back pain.  Prior thoracic decompression.  MRI THORACIC SPINE WITHOUT AND WITH CONTRAST  Technique:  Multiplanar and multiecho pulse sequences of the thoracic spine were obtained without and with intravenous contrast.  Contrast: 12mL MULTIHANCE GADOBENATE DIMEGLUMINE 529 MG/ML IV SOLN  Comparison: MRI 03/21/2011.  CT chest 04/20/2012.  Findings: The patient has undergone previous T9-T11 thoracic laminectomy with epidural tumor resection.  The surgical area is well decompressed without residual or recurrent thoracic cord compression.  Since the prior MR, there has developed widespread  osseous disease. Post XRT changes are noted in the marrow admixed with areas of tumor; some lesions are sclerotic, for instance T9, T10, and T11.  There is epidural tumor which is compressing the thoracic cord from T4-T7.  This epidural tumor is greater on the left and is associated with a moderately large left T5 paravertebral mass (image 15 series 12); the cord is squeezed from side-to-side although the epidural tumor is greater on the left.  The cord is also displaced from left to right.  There is slight increased cord signal at the T4 and T5 levels.  Slight epidural tumor at T10 and T1 is non compressive.  IMPRESSION: Widespread osseous metastatic disease as described.  Interval development since 2012 of new epidural tumor extending from T4-T7 which compresses the thoracic cord and displaces it from left to right as described above.  Moderate left paravertebral mass maximal at T5.  Satisfactory appearance status post thoracic laminectomy and decompression T9-T11.  Findings discussed with Dr. Lynelle Doctor shortly after completion of the study.   Original Report Authenticated By: Davonna Belling, M.D.        Assessment/Plan Active Problems:   1. Back Pain - Most likely due to metastasis with  new epidural spread of tumor from T4-T7 with some cord compression with signal change in the cord. - I have consulted Radiation oncology for further evaluation and recommendations - Pain control  2. Metastatic Breast cancer - once pain controlled and new plan carried out by radiation oncology patient can resume home hospice and f/u with Dr. Darnelle Catalan - continue home regimen  3. Constipation - likely due to pain medication - continue stool softeners  4. Anemia - chronic problem - continue ferrous sulfate  Code Status: DNR Family Communication: with patient and significant other at bedside Disposition Plan: As indicated above and pending further recommendations from radiation oncologist  Time spent: > 60 minutes  Penny Pia [facility] Pager [phone]  If 7PM-7AM, please contact night-coverage [email or web address] Password [facility] 07/21/2012, 7:49 PM

## 2012-07-21 NOTE — ED Notes (Signed)
hospitalist at bedside

## 2012-07-21 NOTE — Progress Notes (Signed)
Patient ID: [name], female   DOB: 10/27/1965, 47 y.o.   MRN: [id number] I was asked by the EDP to review her thoracic MRI. Briefly this is a patient who underwent a thoracic laminectomy bottom one of my partners Dr. 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 She came in today with back pain. According to the ER physician she is neurologically intact and has no weakness or numbness in her legs or problems with bowel and bladder control. She simply came in for pain per the EDP. Repeat MRI showed excellent decompression from T9-T11 she has new epidural spread of tumor from T4-T7 with some cord compression with signal change in the cord. The tumor circumferentially surround the cord at T5 but there still may be a small thin rim of CSF around the cord. Her latest notes from her oncologist suggest she is not responding well to therapy and he has considered referral to hospice if she did not respond to a new chemotherapeutic care, which he did not fill was likely per his note. Given the fact that she has new tumor from T4-7 and the fact that she has had a poor response to therapy thus far, and the fact that she is neurologically intact makes her a poor surgical candidate at this point. I do not feel there is any need for emergent surgery. She could be admitted by the hospitalist and evaluated by the oncologist to see if she is a candidate for further radiotherapy. I have spoken with Dr. Newell Coral and he agrees with this plan. We certainly are available if needed.

## 2012-07-21 NOTE — ED Notes (Signed)
Nurse not available to take report. Will recall report soon. Contact # left for nurse to call back. 

## 2012-07-21 NOTE — ED Provider Notes (Signed)
History     CSN: [id number]  Arrival date & time 07/21/12  1430   First MD Initiated Contact with Patient 07/21/12 1458      Chief Complaint  Patient presents with  . Back Pain    (Consider location/radiation/quality/duration/timing/severity/associated sxs/prior treatment) HPI  Patient reports she was diagnosed with breast cancer in 2011 and has had metastatic disease to her liver and to her spine. She has also  had a total right hip replacement for metastatic disease. She also had surgery in her thoracic spine for metastases to her spine. She relates for the past 2-3 weeks she is started getting pain in her thoracic spine again. She states last night the pain started getting a lot worse. She ran out of her pain medicine last night  and her pain is intense now. She denies any numbness in her extremities. She states she is having pain in her right leg. She denies any urinary or rectal incontinence. She denies any fever. She is getting oral chemotherapy.   Patient has an appointment with Dr. Arlice Colt tomorrow however she could not wait to  keep that appointment.  Oncologist Dr Arlice Colt Orthopedist Dr.Olin  Past Medical History  Diagnosis Date  . Hypertension   . GERD (gastroesophageal reflux disease)   . 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 Pyelonephritis 10/01/11  . Recurrent UTI (urinary tract infection) 10/01/11  . S/P chemotherapy, time since 4-12 weeks   . S/P radiation > 12 weeks   . 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 Brain cancer     spread from breast ca  . Hypercalcemia 06/21/2012    Past Surgical History  Procedure Laterality Date  . Cesarean section  1989; 2010  . Mastectomy  2011    right  . Back surgery  2012    "removed tumor,cancer, from my spine  . Port-a-cath removal  2012    right chest  . Portacath placement  2011    right  . Tubal ligation  2010  . Breast biopsy  2011    right  . Hip arthroplasty   05/04/2012    Procedure: ARTHROPLASTY BIPOLAR HIP;  Surgeon: Shelda Pal, MD;  Location: [facility];  Service: Orthopedics;  Laterality: Right;  RIGHT HIP HEMI ARTHROPLASTY     Family History  Problem Relation Age of Onset  . Cancer Brother 86    died of colon cancer at age of 47  . High blood pressure Mother     History  Substance Use Topics  . Smoking status: Former Smoker -- 0.25 packs/day for 31 years    Types: Cigarettes    Quit date: 04/26/2012  . Smokeless tobacco: Never Used  . Alcohol Use: No  Lives at home Lives with fiance  OB History   Grav Para Term Preterm Abortions TAB SAB Ect Mult Living                  Review of Systems  All other systems reviewed and are negative.    Allergies  Review of patient's allergies indicates no known allergies.  Home Medications   Current Outpatient Rx  Name  Route  Sig  Dispense  Refill  . cyclobenzaprine (FLEXERIL) 10 MG tablet   Oral   Take 10 mg by mouth 3 (three) times daily as needed for muscle spasms.         [name] docusate sodium (COLACE) 100 MG capsule  Oral   Take 1 capsule (100 mg total) by mouth 2 (two) times daily as needed for constipation.   10 capsule   0   . everolimus (AFINITOR) 5 MG tablet   Oral   Take 5 mg by mouth daily.         [name] exemestane (AROMASIN) 25 MG tablet   Oral   Take 25 mg by mouth daily after breakfast.         . ferrous sulfate 325 (65 FE) MG tablet   Oral   Take 325 mg by mouth 3 (three) times daily after meals.         . ondansetron (ZOFRAN) 4 MG tablet   Oral   Take 4 mg by mouth every 6 (six) hours.         [name] oxyCODONE (OXY IR/ROXICODONE) 5 MG immediate release tablet   Oral   Take 1-2 tablets (5-10 mg total) by mouth every 6 (six) hours as needed.   30 tablet   0   . OxyCODONE (OXYCONTIN) 10 mg T12A   Oral   Take 1 tablet (10 mg total) by mouth 3 (three) times daily as needed.   90 tablet   0   . oxyCODONE-acetaminophen (PERCOCET/ROXICET) 5-325 MG per  tablet   Oral   Take 1-2 tablets by mouth every 6 (six) hours as needed for pain.   30 tablet   0   . sennosides-docusate sodium (SENOKOT-S) 8.6-50 MG tablet   Oral   Take 1 tablet by mouth daily as needed for constipation.   20 tablet   0     BP 174/90  Pulse 102  Temp(Src) 98.2 F (36.8 C)  Resp 20  SpO2 100%  Vital signs normal except hypertension, tachycardia   Physical Exam  Nursing note and vitals reviewed. Constitutional: She is oriented to person, place, and time. She appears well-developed and well-nourished.  Non-toxic appearance. She does not appear ill. She appears distressed.  crying  HENT:  Head: Normocephalic and atraumatic.  Right Ear: External ear normal.  Left Ear: External ear normal.  Nose: Nose normal. No mucosal edema or rhinorrhea.  Mouth/Throat: Oropharynx is clear and moist and mucous membranes are normal. No dental abscesses or edematous.  Eyes: Conjunctivae and EOM are normal. Pupils are equal, round, and reactive to light.  Neck: Normal range of motion and full passive range of motion without pain. Neck supple.  Cardiovascular: Normal rate, regular rhythm and normal heart sounds.  Exam reveals no gallop and no friction rub.   No murmur heard. Pulmonary/Chest: Effort normal and breath sounds normal. No respiratory distress. She has no wheezes. She has no rhonchi. She has no rales. She exhibits no tenderness and no crepitus.  Abdominal: Soft. Normal appearance and bowel sounds are normal. She exhibits no distension. There is no tenderness. There is no rebound and no guarding.  Musculoskeletal: Normal range of motion. She exhibits no edema and no tenderness.       Arms: Moves all extremities well.   Patient has a well-healed surgical scar of her lower thoracic spine. This appears to be the same area where she states her pain is located today. She does not have pain in her lumbar spine.  Area of pain noted  Neurological: She is alert and oriented  to person, place, and time. She has normal strength. No cranial nerve deficit.  Skin: Skin is warm, dry and intact. No rash noted. No erythema. No pallor.  Psychiatric: She has  a normal mood and affect. Her speech is normal and behavior is normal. Her mood appears not anxious.    ED Course  Procedures (including critical care time)  Medications  sodium chloride 0.9 % bolus 1,000 mL (1,000 mLs Intravenous New Bag/Given 07/21/12 1543)  HYDROmorphone (DILAUDID) injection 2 mg (2 mg Intravenous Given 07/21/12 1544)  ondansetron (ZOFRAN) injection 4 mg (4 mg Intravenous Given 07/21/12 1543)  LORazepam (ATIVAN) injection 1 mg (1 mg Intravenous Given 07/21/12 1547)  gadobenate dimeglumine (MULTIHANCE) injection 12 mL (12 mLs Intravenous Contrast Given 07/21/12 1703)   17:37 Dr Benard Rink call MR results, states has new epidural mets from T4-7 with mild-mod cord compression  States could either have RT or surgery. States Dr Newell Coral did her prior decompression in Oct 2012  17:45 results of MR given to patient, she was sleeping, her pain seems controlled at this point.  Fiance came about 5 minutes later and he was also updated.   18:07 Dr Yetta Barre, has reviewed her MR with Dr Newell Coral, feels Hospitalist to admit and consult oncology/radiation oncology to discuss RT.  18:19 Dr Cena Benton, admit to med-surg  18:22 patient given plan of admission   Results for orders placed during the hospital encounter of 07/21/12  CBC WITH DIFFERENTIAL      Result Value Range   WBC 4.7  4.0 - 10.5 K/uL   RBC 3.60 (*) 3.87 - 5.11 MIL/uL   Hemoglobin 9.1 (*) 12.0 - 15.0 g/dL   HCT 14.7 (*) 82.9 - 56.2 %   MCV 80.3  78.0 - 100.0 fL   MCH 25.3 (*) 26.0 - 34.0 pg   MCHC 31.5  30.0 - 36.0 g/dL   RDW 13.0 (*) 86.5 - 78.4 %   Platelets 474 (*) 150 - 400 K/uL   Neutrophils Relative 72  43 - 77 %   Neutro Abs 3.4  1.7 - 7.7 K/uL   Lymphocytes Relative 22  12 - 46 %   Lymphs Abs 1.0  0.7 - 4.0 K/uL   Monocytes Relative 5  3 - 12  %   Monocytes Absolute 0.2  0.1 - 1.0 K/uL   Eosinophils Relative 1  0 - 5 %   Eosinophils Absolute 0.0  0.0 - 0.7 K/uL   Basophils Relative 0  0 - 1 %   Basophils Absolute 0.0  0.0 - 0.1 K/uL  POCT I-STAT, CHEM 8      Result Value Range   Sodium 140  135 - 145 mEq/L   Potassium 3.8  3.5 - 5.1 mEq/L   Chloride 107  96 - 112 mEq/L   BUN 8  6 - 23 mg/dL   Creatinine, Ser 6.96  0.50 - 1.10 mg/dL   Glucose, Bld 95  70 - 99 mg/dL   Calcium, Ion 2.95  2.84 - 1.23 mmol/L   TCO2 24  0 - 100 mmol/L   Hemoglobin 9.9 (*) 12.0 - 15.0 g/dL   HCT 13.2 (*) 44.0 - 10.2 %   Laboratory interpretation all normal except anemia         Mr Thoracic Spine W Wo Contrast  07/21/2012  *RADIOLOGY REPORT*  Clinical Data: Breast cancer with recurrent back pain.  Prior thoracic decompression.  MRI THORACIC SPINE WITHOUT AND WITH CONTRAST  Technique:  Multiplanar and multiecho pulse sequences of the thoracic spine were obtained without and with intravenous contrast.  Contrast: 12mL MULTIHANCE GADOBENATE DIMEGLUMINE 529 MG/ML IV SOLN  Comparison: MRI 03/21/2011.  CT chest 04/20/2012.  Findings: The  patient has undergone previous T9-T11 thoracic laminectomy with epidural tumor resection.  The surgical area is well decompressed without residual or recurrent thoracic cord compression.  Since the prior MR, there has developed widespread osseous disease. Post XRT changes are noted in the marrow admixed with areas of tumor; some lesions are sclerotic, for instance T9, T10, and T11.  There is epidural tumor which is compressing the thoracic cord from T4-T7.  This epidural tumor is greater on the left and is associated with a moderately large left T5 paravertebral mass (image 15 series 12); the cord is squeezed from side-to-side although the epidural tumor is greater on the left.  The cord is also displaced from left to right.  There is slight increased cord signal at the T4 and T5 levels.  Slight epidural tumor at T10 and T1 is  non compressive.  IMPRESSION: Widespread osseous metastatic disease as described.  Interval development since 2012 of new epidural tumor extending from T4-T7 which compresses the thoracic cord and displaces it from left to right as described above.  Moderate left paravertebral mass maximal at T5.  Satisfactory appearance status post thoracic laminectomy and decompression T9-T11.  Findings discussed with Dr. Lynelle Doctor shortly after completion of the study.   Original Report Authenticated By: Davonna Belling, M.D.     Ct Abdomen Pelvis W Contrast  07/07/2012    IMPRESSION: 1.  Continued progression of extensive hepatic metastatic disease with resultant increased hepatomegaly resulting in mass effect upon the adjacent organs. 2.  Moderate to large colonic stool burden without evidence of obstruction. 3.  Grossly unchanged extensive osseous metastatic disease within the thoracolumbar spine with likely unchanged epidural involvement posterior to the L1 and L3 vertebral bodies.  4.  Suspected progression of mixed lytic/sclerotic lesion within the left ilium.   Original Report Authenticated By: Tacey Ruiz, MD    Dg Abd Acute W/chest  07/07/2012    IMPRESSION: 1.  No acute cardiopulmonary disease. 2.  Moderate to large colonic stool burden without evidence of obstruction.  No pneumoperitoneum.   Original Report Authenticated By: Tacey Ruiz, MD      1. Back pain   2. 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 Breast cancer     Plan admission   Devoria Albe, MD, FACEP   MDM          Ward Givens, MD 07/21/12 [phone]

## 2012-07-22 ENCOUNTER — Ambulatory Visit
Admit: 2012-07-22 | Discharge: 2012-07-22 | Disposition: A | Discharge status: Home / Self Care | Payer: Medicaid Other | Attending: Radiation Oncology | Admitting: Radiation Oncology

## 2012-07-22 ENCOUNTER — Ambulatory Visit
Admission: RE | Admit: 2012-07-22 | Discharge: 2012-07-22 | Disposition: A | Discharge status: Home / Self Care | Payer: Medicaid Other | Source: Ambulatory Visit | Attending: Radiation Oncology | Admitting: Radiation Oncology

## 2012-07-22 ENCOUNTER — Encounter: Payer: Self-pay | Admitting: Radiation Oncology

## 2012-07-22 ENCOUNTER — Ambulatory Visit: Payer: Medicaid Other | Admitting: Physician Assistant

## 2012-07-22 ENCOUNTER — Telehealth: Payer: Self-pay | Admitting: *Deleted

## 2012-07-22 ENCOUNTER — Other Ambulatory Visit: Payer: Medicaid Other | Admitting: Lab

## 2012-07-22 VITALS — BP 112/68 | HR 79 | Temp 97.9°F | Resp 18 | Ht 62.0 in | Wt 130.3 lb

## 2012-07-22 DIAGNOSIS — C7951 Secondary malignant neoplasm of bone: Secondary | ICD-10-CM

## 2012-07-22 DIAGNOSIS — C50519 Malignant neoplasm of lower-outer quadrant of unspecified female breast: Secondary | ICD-10-CM

## 2012-07-22 DIAGNOSIS — C719 Malignant neoplasm of brain, unspecified: Secondary | ICD-10-CM

## 2012-07-22 DIAGNOSIS — Z79899 Other long term (current) drug therapy: Secondary | ICD-10-CM | POA: Insufficient documentation

## 2012-07-22 DIAGNOSIS — C50919 Malignant neoplasm of unspecified site of unspecified female breast: Secondary | ICD-10-CM | POA: Insufficient documentation

## 2012-07-22 DIAGNOSIS — C7952 Secondary malignant neoplasm of bone marrow: Secondary | ICD-10-CM | POA: Insufficient documentation

## 2012-07-22 DIAGNOSIS — Z510 Encounter for antineoplastic radiation therapy: Secondary | ICD-10-CM | POA: Insufficient documentation

## 2012-07-22 DIAGNOSIS — G893 Neoplasm related pain (acute) (chronic): Secondary | ICD-10-CM

## 2012-07-22 DIAGNOSIS — Z923 Personal history of irradiation: Secondary | ICD-10-CM | POA: Insufficient documentation

## 2012-07-22 LAB — BASIC METABOLIC PANEL
BUN: 9 mg/dL (ref 6–23)
Chloride: 104 mEq/L (ref 96–112)
Creatinine, Ser: 0.68 mg/dL (ref 0.50–1.10)
GFR calc Af Amer: >90 mL/min (ref >90)
GFR calc non Af Amer: >90 mL/min (ref >90)

## 2012-07-22 LAB — CBC
HCT: 25.2 % — ABNORMAL LOW (ref 36.0–46.0)
MCHC: 32.5 g/dL (ref 30.0–36.0)
Platelets: 365 10*3/uL (ref 150–400)
RDW: 18.3 % — ABNORMAL HIGH (ref 11.5–15.5)
WBC: 3.5 10*3/uL — ABNORMAL LOW (ref 4.0–10.5)

## 2012-07-22 MED ORDER — HYDROMORPHONE HCL PF 2 MG/ML IJ SOLN: 2.0000 mg | INTRAMUSCULAR | Status: DC | PRN

## 2012-07-22 MED ORDER — HYDROMORPHONE HCL PF 2 MG/ML IJ SOLN
2.0000 mg | INTRAMUSCULAR | Status: DC | PRN
  Administered 2012-07-22 – 2012-07-23 (×8): 2 mg via INTRAVENOUS
  Filled 2012-07-22 (×8): qty 1

## 2012-07-22 MED ORDER — DEXAMETHASONE 4 MG PO TABS
8.0000 mg | ORAL_TABLET | Freq: Two times a day (BID) | ORAL | Status: DC
  Administered 2012-07-22 – 2012-07-23 (×3): 8 mg via ORAL
  Filled 2012-07-22 (×4): qty 2

## 2012-07-22 NOTE — Progress Notes (Signed)
INITIAL NUTRITION ASSESSMENT  DOCUMENTATION CODES Per approved criteria  -Not Applicable   INTERVENTION: No intervention at this time, intervention may be warranted if pt's appetite declines again  NUTRITION DIAGNOSIS: Inadequate oral intake related to poor appetite PTA as evidenced by patient report.   Goal: Meet >/=90% estimated nutrition needs  Monitor:  PO's, weight trends  Reason for Assessment: Malnutrition Screening Tool  47 y.o. female  Admitting Dx: Back pain  ASSESSMENT: Upon admission, pt stated right hip, right leg, and increasing back pain which is new for her; she is s/p total right hip replacement. Pt diagnosed with breast cancer in 2010 and has mets to liver and spine. Pt is receiving oral chemotherapy.   CT of abdomen and pelvis on 2/6 showed moderate to large stool burden in the colon which may be consistent with an obstruction and continued progression of mets to the liver which results in effect to the adjacent organs, per physician note.   Pt s/p thoracic laminectomy about 1-1/2 years ago due to epidural tumor. Per oncology, she is not responding well to therapy and she may be an appropriate referral to hospice. Per neurosurgery, she is a poor surgical candidate at this time and there is no need for emergent surgery.    Pt with recent admission earlier this month and was d/c'ed with home hospice. Thoracic spine MRI on 2/20 showed new epidural tumor that is compressing the thoracic cord. Pt is s/p radiation treatment and 2011 planned doses of paclitaxel were d/c'ed due to development of peripheral neuropathy. Tamoxifen in 2012 ws d/c'ed due to nausea. Pt is s/p November 2013 provision of palliative radiation to the right hip. Possible d/c in the next day or two if she is able to be transitioned to oral medications and with plans to receive radiation as an outpatient.   CT simulation pending for 1 pm today.   Pt states her usual weight "before all of this" was  118-120 pounds. Current weight is 132 pounds, which is down from 146 pounds in December (14 pound weight loss; 9.5% body weight loss). Pt states that since admission her appetite has been good and has increased, she feels this is due to the steroids that she is prescribed. She reports that PTA her appetite was very poor due to severe pain. Due hx of weight loss and poor appetite PTA, pt is at increased risk for malnutrition. She feels that no nutrition intervention is needed at this time given her appetite and no reports of n/v. Will continue to monitor to assess need for intervention.   Height: Ht Readings from Last 1 Encounters:  07/21/12 5\' 2"  (1.575 m)    Weight: Wt Readings from Last 1 Encounters:  07/21/12 132 lb 1.6 oz (59.92 kg)    Ideal Body Weight: 110 lbs  % Ideal Body Weight: 120%  Wt Readings from Last 10 Encounters:  07/21/12 132 lb 1.6 oz (59.92 kg)  07/09/12 134 lb (60.782 kg)  06/21/12 135 lb 12.8 oz (61.598 kg)  06/09/12 138 lb (62.596 kg)  05/23/12 138 lb (62.596 kg)  05/23/12 138 lb 1.6 oz (62.642 kg)  05/13/12 146 lb 9.6 oz (66.497 kg)  05/05/12 140 lb (63.504 kg)  05/05/12 140 lb (63.504 kg)  05/03/12 146 lb 1.6 oz (66.271 kg)    Usual Body Weight: 118-120 lbs, per pt report  % Usual Body Weight: 110-112%  BMI:  Body mass index is 24.16 kg/(m^2).--Normal  Estimated Nutritional Needs: Kcal: 1500-1700 Protein: 75-85 grams Fluid:  1.5-1.7 L/day  Skin: intact, no wounds noted  Diet Order: General  EDUCATION NEEDS: -No education needs identified at this time   Intake/Output Summary (Last 24 hours) at 07/22/12 1001 Last data filed at 07/22/12 0553  Gross per 24 hour  Intake    860 ml  Output    800 ml  Net     60 ml    Last BM: PTA  Labs:   Recent Labs Lab 07/21/12 1604 07/22/12 0350  NA 140 137  K 3.8 3.8  CL 107 104  CO2  --  21  BUN 8 9  CREATININE 0.90 0.68  CALCIUM  --  8.9  GLUCOSE 95 144*    CBG (last 3)  No results  found for this basename: GLUCAP,  in the last 72 hours  Scheduled Meds: . dexamethasone  8 mg Oral Q12H  . everolimus  5 mg Oral Daily  . exemestane  25 mg Oral QPC breakfast  . ferrous sulfate  325 mg Oral TID PC  . OxyCODONE  15 mg Oral Q12H  . sodium chloride  3 mL Intravenous Q12H    Continuous Infusions: . sodium chloride 100 mL/hr at 07/22/12 1610    Past Medical History  Diagnosis Date  . Hypertension   . GERD (gastroesophageal reflux disease)   . 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 Pyelonephritis 10/01/11  . Recurrent UTI (urinary tract infection) 10/01/11  . S/P chemotherapy, time since 4-12 weeks   . S/P radiation > 12 weeks   . 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 Brain cancer     spread from breast ca  . Hypercalcemia 06/21/2012    Past Surgical History  Procedure Laterality Date  . Cesarean section  1989; 2010  . Mastectomy  2011    right  . Back surgery  2012    "removed tumor,cancer, from my spine  . Port-a-cath removal  2012    right chest  . Portacath placement  2011    right  . Tubal ligation  2010  . Breast biopsy  2011    right  . Hip arthroplasty  05/04/2012    Procedure: ARTHROPLASTY BIPOLAR HIP;  Surgeon: Shelda Pal, MD;  Location: [facility];  Service: Orthopedics;  Laterality: Right;  RIGHT HIP HEMI ARTHROPLASTY     [facility] Dietetic Intern # [address] [place], [place], [place] [phone] Pager [phone] After Hours Pager

## 2012-07-22 NOTE — Progress Notes (Signed)
Department of Radiation Oncology  Phone:  [phone] Fax:        [phone]   Name: RAEONNA MILO MRN: [id number]  DOB: 01-24-1966  Date: 07/22/2012  Follow Up Visit Note  Diagnosis: Metastatic breast cancer to spine with cord compression  Summary and Interval since last radiation: [name] has received prior radiation to her right hip, clivus, right chest wall and supraclavicular fossa, and T82-T12 vertebral bodies.  Interval History: Jhada presents today for routine followup.  She completed radiation to her head and clivus in November. It her vision is much improved. She began having increasing back pain. She presented to the emergency room and was worked up with an MRI of the spine on February 20. This showed epidural tumor with cord compression from T4-T7. Epidural tumor was also noted at T10 and T1. This was not felt to be causing any cord compression. Widespread osseous metastatic disease was noted. Recent imaging included a CT of the abdomen and pelvis on 07/07/2012 which showed continued progression of her hepatic metastases now measuring 10.8 x 9.7 cm on the right side and the entirety of the left lobe replaced by metastatic disease measuring 5.4 x 6.2 cm. She states she has not been enrolled in hospice because she is on 2 cancer medications. She feels a miracle can still happen and is not want to "give up". She says that she trusts inside and does not feel he is done with her here. He reports she has continued difficulties with constipation secondary to her narcotic use. She has no urinary issues. She has been evaluated by neurosurgery who did not feel she is an operative candidate. He reports no weakness or numbness in her bilateral upper or lower extremities.  Allergies: No Known Allergies  Medications:  Current Facility-Administered Medications  Medication Dose Route Frequency Provider Last Rate Last Dose  . 0.9 %  sodium chloride infusion   Intravenous Continuous Ward Givens, MD 100 mL/hr at 07/22/12 1620    . 0.9 %  sodium chloride infusion  250 mL Intravenous PRN Penny Pia, MD      . cyclobenzaprine (FLEXERIL) tablet 10 mg  10 mg Oral TID PRN Penny Pia, MD      . dexamethasone (DECADRON) tablet 8 mg  8 mg Oral Q12H Lowella Dell, MD   8 mg at 07/22/12 1055  . docusate sodium (COLACE) capsule 100 mg  100 mg Oral BID PRN Penny Pia, MD      . everolimus (AFINITOR) tablet 5 mg  5 mg Oral Daily Penny Pia, MD   5 mg at 07/22/12 1003  . exemestane (AROMASIN) tablet 25 mg  25 mg Oral QPC breakfast Penny Pia, MD   25 mg at 07/22/12 1003  . ferrous sulfate tablet 325 mg  325 mg Oral TID PC Penny Pia, MD   325 mg at 07/22/12 1620  . HYDROmorphone (DILAUDID) injection 2 mg  2 mg Intravenous Q2H PRN Alison Murray, MD   2 mg at 07/22/12 1620  . ondansetron (ZOFRAN) tablet 4 mg  4 mg Oral Q6H PRN Penny Pia, MD       Or  . ondansetron (ZOFRAN) injection 4 mg  4 mg Intravenous Q6H PRN Penny Pia, MD      . oxyCODONE (Oxy IR/ROXICODONE) immediate release tablet 5-10 mg  5-10 mg Oral Q6H PRN Penny Pia, MD   10 mg at 07/22/12 0512  . OxyCODONE (OXYCONTIN) 12 hr tablet 15 mg  15 mg Oral  Q12H Ward Givens, MD   15 mg at 07/22/12 1003  . senna-docusate (Senokot-S) tablet 1 tablet  1 tablet Oral Daily PRN Ramses L Vega-Casasnovas, MD      . sodium chloride 0.9 % injection 3 mL  3 mL Intravenous Q12H Penny Pia, MD   3 mL at 07/21/12 2200  . sodium chloride 0.9 % injection 3 mL  3 mL Intravenous PRN Penny Pia, MD        Physical Exam:  Filed Vitals:   07/22/12 0519  BP: 123/71  Pulse: 105  Temp: 98 F (36.7 C)  Resp: 16    She is a pleasant female in no distress sitting comfortably examining table. She is in biceps strength. She has 5 out of 5 strength in her bilateral lower extremities. She has no sensory level. She is alert and oriented x3. 5 out of 5 grip  IMPRESSION: Gerri is a 47 y.o. female With progressive widespread metastatic  disease in the setting of progressive liver disease and a cord compression in the thoracic spine with other areas in the thoracic spine with epidural tumor   PLAN:   I spoke to Dorthea today again about her prognosis. We discussed that radiation could be used to spot weld problem areas that we could not give her radiation to her entire body. We discussed that her liver disease was progressing significantly and this would likely be the cause of her death in the very near future. She is resistant to accepting the fact and states she would like to discuss this further with Dr. Darnelle Catalan at her followup visit with him in a few weeks. I offered her single fraction treatment to this thoracic spine lesion given her limited life expectancy. She felt this was not fair and wanted this to be treated over a two-week time period as her other metastases had been. Despite showing no survival benefit I agreed to treat her over the course of 2 weeks. She has had difficulties with transportation in the past which is also why I attempted to shorten her treatments. I also think she would not tolerate treating this area as well as her tumor at T1 and T12. This would essentially be her entire esophagus and part of her stomach. For now I think we can focus on this problem area in the midthoracic spine and we may need to come back later and treat these other areas. I have asked the chaplain to meet with her on the floor if at all possible. We discussed that she could have esophagitis and nausea as accommodating part of this treatment. She still has some Carafate tablets left over from her previous treatments. We discussed her skin to get dark and her back as we will have to treat her mostly from the posterior angles to avoid her previous radiation to her chest wall. I will half beam block at the top of her previously treated spinal field.     [name], MD

## 2012-07-22 NOTE — Progress Notes (Signed)
[facility] Radiation Oncology Dept Therapy Treatment Record Phone [phone]   Radiation Therapy was administered to [name] on: 07/22/2012  5:26 PM and was treatment # 1 out of a planned course of 10 treatments.

## 2012-07-22 NOTE — Progress Notes (Signed)
[name]   DOB:08/28/1965   ZO#:[id number]   JWJ#:[id number]  Subjective: [name] developed severe pain while visiting her son in [place]. She was admitted here through the ED and evaluation by NSU suggests no immediate need to surgery (and questions whether surgery would be advisable given overall sitation). Patient this morning is more comfortable, eating breakfast, "worried."   Objective:  Filed Vitals:   07/22/12 0519  BP: 123/71  Pulse: 105  Temp: 98 F (36.7 C)  Resp: 16    Body mass index is 24.16 kg/(m^2).  Intake/Output Summary (Last 24 hours) at 07/22/12 0927 Last data filed at 07/22/12 0553  Gross per 24 hour  Intake    860 ml  Output    800 ml  Net     60 ml     Sclerae unicteric  Oropharynx clear  Lungs clear -- no rales or rhonchi  Heart regular rate and rhythm  Abdomen benign  No peripheral edema  Neuro : no sensory level; dorsiflexion 5+ bilaterally; no focal weakness  Breast exam: deferred  CBG (last 3)  No results found for this basename: GLUCAP,  in the last 72 hours   Labs:  Lab Results  Component Value Date   WBC 3.5* 07/22/2012   HGB 8.1* 07/22/2012   HCT 25.2* 07/22/2012   MCV 81.6 07/22/2012   PLT 365 07/22/2012   NEUTROABS 3.4 07/21/2012    @LASTCHEMISTRY @  Urine Studies No results found for this basename: UACOL, UAPR, USPG, UPH, UTP, UGL, UKET, UBIL, UHGB, UNIT, UROB, ULEU, UEPI, UWBC, URBC, UBAC, CAST, CRYS, UCOM, BILUA,  in the last 72 hours  Basic Metabolic Panel:  Recent Labs Lab 07/21/12 1604 07/22/12 0350  NA 140 137  K 3.8 3.8  CL 107 104  CO2  --  21  GLUCOSE 95 144*  BUN 8 9  CREATININE 0.90 0.68  CALCIUM  --  8.9   GFR Estimated Creatinine Clearance: 68.8 ml/min (by C-G formula based on Cr of 0.68). Liver Function Tests: No results found for this basename: AST, ALT, ALKPHOS, BILITOT, PROT, ALBUMIN,  in the last 168 hours No results found for this basename: LIPASE, AMYLASE,  in the last 168 hours No results found  for this basename: AMMONIA,  in the last 168 hours Coagulation profile No results found for this basename: INR, PROTIME,  in the last 168 hours  CBC:  Recent Labs Lab 07/21/12 1500 07/21/12 1604 07/22/12 0350  WBC 4.7  --  3.5*  NEUTROABS 3.4  --   --   HGB 9.1* 9.9* 8.1*  HCT 28.9* 29.0* 25.2*  MCV 80.3  --  81.6  PLT 474*  --  365   Cardiac Enzymes: No results found for this basename: CKTOTAL, CKMB, CKMBINDEX, TROPONINI,  in the last 168 hours BNP: No components found with this basename: POCBNP,  CBG: No results found for this basename: GLUCAP,  in the last 168 hours D-Dimer No results found for this basename: DDIMER,  in the last 72 hours Hgb A1c No results found for this basename: HGBA1C,  in the last 72 hours Lipid Profile No results found for this basename: CHOL, HDL, LDLCALC, TRIG, CHOLHDL, LDLDIRECT,  in the last 72 hours Thyroid function studies No results found for this basename: TSH, T4TOTAL, FREET3, T3FREE, THYROIDAB,  in the last 72 hours Anemia work up No results found for this basename: VITAMINB12, FOLATE, FERRITIN, TIBC, IRON, RETICCTPCT,  in the last 72 hours Microbiology No results found for this or  any previous visit (from the past 240 hour(s)).    Studies:  Mr Thoracic Spine W Wo Contrast  07/21/2012  *RADIOLOGY REPORT*  Clinical Data: Breast cancer with recurrent back pain.  Prior thoracic decompression.  MRI THORACIC SPINE WITHOUT AND WITH CONTRAST  Technique:  Multiplanar and multiecho pulse sequences of the thoracic spine were obtained without and with intravenous contrast.  Contrast: 12mL MULTIHANCE GADOBENATE DIMEGLUMINE 529 MG/ML IV SOLN  Comparison: MRI 03/21/2011.  CT chest 04/20/2012.  Findings: The patient has undergone previous T9-T11 thoracic laminectomy with epidural tumor resection.  The surgical area is well decompressed without residual or recurrent thoracic cord compression.  Since the prior MR, there has developed widespread osseous  disease. Post XRT changes are noted in the marrow admixed with areas of tumor; some lesions are sclerotic, for instance T9, T10, and T11.  There is epidural tumor which is compressing the thoracic cord from T4-T7.  This epidural tumor is greater on the left and is associated with a moderately large left T5 paravertebral mass (image 15 series 12); the cord is squeezed from side-to-side although the epidural tumor is greater on the left.  The cord is also displaced from left to right.  There is slight increased cord signal at the T4 and T5 levels.  Slight epidural tumor at T10 and T1 is non compressive.  IMPRESSION: Widespread osseous metastatic disease as described.  Interval development since 2012 of new epidural tumor extending from T4-T7 which compresses the thoracic cord and displaces it from left to right as described above.  Moderate left paravertebral mass maximal at T5.  Satisfactory appearance status post thoracic laminectomy and decompression T9-T11.  Findings discussed with Dr. Lynelle Doctor shortly after completion of the study.   Original Report Authenticated By: Davonna Belling, M.D.     Assessment: 47 y.o. [place] woman admitted with uncontrolled pain, spinal cord impingement at T4 level and progression of bony metastatic disease 1. 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  2.Status post adjuvant chemotherapy July to November 2011, consisting of 4 cycles of dose dense doxorubicin and cyclophosphamide, followed by 4 of 12 planned weekly doses of paclitaxel given adjuvantly, discontinued secondary to peripheral neuropathy.  3. Status post radiation to the right chest wall, truncated due to problems with the patient missing appointments.  4. Tamoxifen started June of 2012, but discontinued due to nausea.  5. metastatic  disease to liver and bone pathologically documented October of 2012, estrogen receptor 100% positive, progesterone receptor 7% positive, with no HER-2 amplification; treatment in the metastatic setting has consisted of:  6.Faslodex started 03/16/2011, discontinued November 2013 with progression. Zoladex every 3 months also started on 03/16/2011, and monthly zoledronic acid started November 2011, the Zometa I currently held due to pending dental procedures and possible extractions.  7. Status post thoracic laminectomy, T9-T11, for impending cord compression on 03/21/2011, followed by irradiation to the spine completed November 2011  8. 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 s/p palliative radiation to the Right hip clivus to 30 Gy completed 04/07/2012  10. Progression noted by scans in November 2013, at which time Faslodex was discontinued. Patient was started on exemestane in early December, with everolimus started in mid December at 5 mg daily. Continues to receive  Zoladex injections every 3 months.  11. Nondisplaced right femoral neck fracture, status post right hip arthroplasty on 05/04/2012 under the care of Dr.Matt [facility]  12. Hypercalcemia with elevated serum creatinine: resolved  13: patient does not desire resuscitation in case of a terminal event  Plan: I have asked Radiation oncology to review prior ports and see if we can irradiate the area of concern. Have started patient on steroids prophylactically. If her pain meds can be transitioned to oral with good control over the next 24-48 hours she could be discharged and receive radiation treatments as outpatient. We will follow with you.   MAGRINAT,GUSTAV C 07/22/2012

## 2012-07-22 NOTE — Progress Notes (Signed)
Pt in rm 6 to see Dr Michell Heinrich prior to ct sim today. She denies pain.

## 2012-07-22 NOTE — Progress Notes (Signed)
[facility] PROGRESS NOTE  [name] IHK:[id number] DOB: [birth year] DOA: 07/21/2012 PCP: No primary provider on file.  Brief narrative: 47 y.o. female with past medical history of breast cancer with bone metastasis (underwent thoracic laminectomy by Dr. 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 Patient had excellend decompression from T9-T11. Patient was recently at hospital and discharged home with home hospice. She presented to ED 07/21/2012 due to the intractable bone pain worse in her back.  In ED, patient had MRI which showed excellent decompression from T9-T11 but she has new epidural spread of tumor from T4-T7 with some cord compression with signal change in the cord. Neurosurgeon was consulted and recommended a radiation oncology consult for further evaluation and recommendations as patient in his opinion was a poor surgical candidate.  Assessment/Plan:  Principal problem: *Back pain in the setting of epidural tumor spread with cord compression  Secondary to metastatic breast cancer  Appreciate oncology following. Plan for radiation treatment to the area.   Continue Decadron 8 mg every 12 hours  We will discontinue morphine and start Decadron 2 mg every 2 hours IV when necessary severe pain, oxycodone when necessary by mouth moderate pain and OxyContin 50 mg twice daily scheduled.   Active problems Malignant neoplasm of breast (female), unspecified site  Continue Aromasin 25 mg daily and Afinitor 5 mg daily Anemia of chronic disease  Secondary to history of malignancy  Hemoglobin 8.1 today   Code Status: DNR Family Communication: family not at bedside Disposition Plan: when stable  Manson Passey, MD  [facility] Pager [phone]  If 7PM-7AM, please contact night-coverage [email or web address] Password [email or web address] 07/22/2012, 7:05 AM   LOS: 1 day   Consultants:  Oncology  Radiation oncology  Procedures:  Radiation therapy  Antibiotics:  None    HPI/Subjective: Still complains of generalized pain.  Objective: Filed Vitals:   07/21/12 1441 07/21/12 1943 07/21/12 2058 07/22/12 0519  BP: 174/90 141/86 150/84 123/71  Pulse: 102 102 103 105  Temp: 98.2 F (36.8 C) 98.5 F (36.9 C) 98.1 F (36.7 C) 98 F (36.7 C)  TempSrc:  Oral Oral Oral  Resp: 20 14 16 16   Height:   5\' 2"  (1.575 m)   Weight:   59.92 kg (132 lb 1.6 oz)   SpO2: 100% 99% 100% 99%    Intake/Output Summary (Last 24 hours) at 07/22/12 0705 Last data filed at 07/22/12 0553  Gross per 24 hour  Intake    860 ml  Output    800 ml  Net     60 ml    Exam:   General:  Pt is alert, follows commands appropriately, not in acute distress  Cardiovascular: Regular rate and rhythm, S1/S2, no murmurs, no rubs, no gallops  Respiratory: Clear to auscultation bilaterally, no wheezing, no crackles, no rhonchi  Abdomen: Soft, non tender, non distended, bowel sounds present, no guarding  Extremities: No edema, pulses DP and PT palpable bilaterally  Neuro: Grossly nonfocal  Data Reviewed: Basic Metabolic Panel:  Recent Labs Lab 07/21/12 1604 07/22/12 0350  NA 140 137  K 3.8 3.8  CL 107 104  CO2  --  21  GLUCOSE 95 144*  BUN 8 9  CREATININE 0.90 0.68  CALCIUM  --  8.9   CBC:  Recent Labs Lab 07/21/12 1500 07/21/12 1604 07/22/12 0350  WBC 4.7  --  3.5*  NEUTROABS 3.4  --   --   HGB 9.1* 9.9* 8.1*  HCT 28.9* 29.0* 25.2*  MCV 80.3  --  81.6  PLT 474*  --  365    Studies: Mr Thoracic Spine W Wo Contrast 07/21/2012  * IMPRESSION: Widespread osseous metastatic disease as described.  Interval development since 2012 of new epidural tumor extending from T4-T7 which compresses the thoracic cord and displaces it from left to right as described above.  Moderate left paravertebral mass maximal at T5.  Satisfactory appearance status post thoracic laminectomy and decompression T9-T11.  Findings discussed with Dr. Lynelle Doctor shortly after completion of the study.    Original Report Authenticated By: Davonna Belling, M.D.     Scheduled Meds: . everolimus  5 mg Oral Daily  . exemestane  25 mg Oral QPC breakfast  . ferrous sulfate  325 mg Oral TID PC  . OxyCODONE  15 mg Oral Q12H   Continuous Infusions: . sodium chloride 100 mL/hr at 07/22/12 [phone]

## 2012-07-22 NOTE — Progress Notes (Signed)
  Radiation Oncology         ([phone]) [phone] ________________________________  Name: [name] MRN: [id number]  Date: 07/22/2012  DOB: Nov 22, 1965  Simulation Verification Note  Status: outpatient  NARRATIVE: The patient was brought to the treatment unit and placed in the planned treatment position. The clinical setup was verified. Then port films were obtained and uploaded to the radiation oncology medical record software.  The treatment beams were carefully compared against the planned radiation fields. The position location and shape of the radiation fields was reviewed. The targeted volume of tissue appears appropriately covered by the radiation beams. Organs at risk appear to be excluded as planned.  Based on my personal review, I approved the simulation verification. The patient's treatment will proceed as planned.  ------------------------------------------------  [name], MD

## 2012-07-22 NOTE — Plan of Care (Signed)
Problem: Phase I Progression Outcomes Goal: Pain controlled with appropriate interventions Outcome: Progressing Medicate patient as needed, notify md for increase in pain meds.

## 2012-07-22 NOTE — Telephone Encounter (Signed)
Spoke w/Kristi, Charity fundraiser re: pt scheduled for CT sim today. She reports pt is asymptomatic from her cord compression per scan. Pt c/o r hip pain but has had recent hip replacement, c/o back pain. Silva Bandy states pt denies numbness, tingling, and pt's neuro and strength checks are normal. Pt ambulates w/cane due to hip replacement. Pt has peripheral IV w/NS @ 100 cc/hr infusing. Pt on Oxycontin 15 mg BID, Oxycontin 10 mg prn tid. She states pt can also receive Morphine 1 mg IV prn. Advised she medicate pt prior to ct sim appt at 1 pm today. Pt will be brought to nursing for consent prior to ct sim.

## 2012-07-22 NOTE — Progress Notes (Signed)
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  Identity was confirmed.  All relevant records and images related to the planned course of therapy were reviewed.  Then, the patient was positioned in a stable reproducible clinical set-up for radiation therapy.  CT images were obtained.  An isocenter was placed. Skin markings were placed.  The CT images were loaded into the planning software where the target and avoidance structures were contoured.  The radiation prescription was entered and confirmed. The patient was discharged in stable condition and tolerated simulation well.    TREATMENT PLANNING NOTE:  Treatment planning then occurred. I have requested : MLC's, isodose plan, basic dose calculation  I have requested 3 dimensional simulation with DVH of cord, lungs and GTV  Special treatment procedure will be performed as she has had previous treatment to this area.  I personally oversaw the construction of a  total of 2 complex treatment devices will be used in the form of unique MLCs which will be used to protect critical normal structures.

## 2012-07-23 MED ORDER — DEXAMETHASONE 4 MG PO TABS: 8.0000 mg | ORAL_TABLET | Freq: Two times a day (BID) | ORAL | Status: DC

## 2012-07-23 MED ORDER — OXYCODONE HCL ER 10 MG PO T12A: 10.0000 mg | EXTENDED_RELEASE_TABLET | Freq: Three times a day (TID) | ORAL | Status: DC | PRN

## 2012-07-23 MED ORDER — OXYCODONE-ACETAMINOPHEN 10-325 MG PO TABS: 1.0000 | ORAL_TABLET | ORAL | Status: DC | PRN

## 2012-07-23 MED ORDER — ZOLPIDEM TARTRATE 5 MG PO TABS: 5.0000 mg | ORAL_TABLET | Freq: Every evening | ORAL | Status: DC | PRN

## 2012-07-23 MED ORDER — HYDROMORPHONE HCL 4 MG PO TABS: 4.0000 mg | ORAL_TABLET | ORAL | Status: DC | PRN

## 2012-07-23 NOTE — Progress Notes (Signed)
Patient discharged per MD order. Discharge orders thoroughly gone over with patient and prescriptions given to patient. Patient supplied med Engineer, agricultural) picked up from pharmacy and returned to patient. Pt wheeled to car via nurse tech for discharge to home. Angelena Form, RN

## 2012-07-23 NOTE — Discharge Summary (Signed)
Physician Discharge Summary  [name]:[id number] DOB: September 07, 1965 DOA: 07/21/2012  PCP: No primary provider on file.  Admit date: 07/21/2012 Discharge date: 07/23/2012  Recommendations for Outpatient Follow-up:  1. Followup with PCP in one to 2 weeks after discharge or sooner if symptoms persist. 2. Patient was instructed to continue taking Decadron and to continue radiation treatment per scheduled appointments.  Discharge Diagnoses:  Principal Problem:   Back pain Active Problems:   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   Bone metastases   GERD (gastroesophageal reflux disease)   Hypertension   Malignant neoplasm of breast (female), unspecified site   Hypercalcemia   Constipation   Anemia  Discharge Condition: Medically stable for discharge home today. Prescriptions for provided for Percocet, Dilaudid and OxyContin  Diet recommendation: As tolerated  History of present illness:  47 y.o. female with past medical history of breast cancer with bone metastasis (underwent thoracic laminectomy by Dr. 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 Patient had excellend decompression from T9-T11. Patient was recently at hospital and discharged home with home hospice. She presented to ED 07/21/2012 due to the intractable bone pain worse in her back.  In ED, patient had MRI which showed excellent decompression from T9-T11 but she has new epidural spread of tumor from T4-T7 with some cord compression with signal change in the cord. Neurosurgeon was consulted and recommended a radiation oncology consult for further evaluation and recommendations as patient in his opinion was a poor surgical candidate.   Assessment/Plan:  Principal problem:  *Back pain in the setting of epidural tumor spread with cord compression  Secondary to metastatic breast cancer  Appreciate oncology following while patient in hospital. Plan to continue radiation treatment to the spinal  area. Continue Decadron 8 mg every 12 hours  Prescriptions provided for OxyContin, Percocet and Dilaudid. Active problems  Malignant neoplasm of breast (female), unspecified site  Continue Aromasin 25 mg daily and Afinitor 5 mg daily Anemia of chronic disease  Secondary to history of malignancy  Hemoglobin 8.1  Code Status: DNR  Family Communication: family not at bedside    Manson Passey, MD  [facility]  Pager [phone]   Consultants:  Oncology  Radiation oncology Procedures:  Radiation therapy Antibiotics:  None    Discharge Exam: Filed Vitals:   07/23/12 0540  BP: 130/72  Pulse: 112  Temp: 98.5 F (36.9 C)  Resp: 16   Filed Vitals:   07/21/12 1943 07/21/12 2058 07/22/12 0519 07/23/12 0540  BP: 141/86 150/84 123/71 130/72  Pulse: 102 103 105 112  Temp: 98.5 F (36.9 C) 98.1 F (36.7 C) 98 F (36.7 C) 98.5 F (36.9 C)  TempSrc: Oral Oral Oral Oral  Resp: 14 16 16 16   Height:  5\' 2"  (1.575 m)    Weight:  59.92 kg (132 lb 1.6 oz)    SpO2: 99% 100% 99% 95%    General: Pt is alert, follows commands appropriately, not in acute distress Cardiovascular: Regular rate and rhythm, S1/S2 +, no murmurs, no rubs, no gallops Respiratory: Clear to auscultation bilaterally, no wheezing, no crackles, no rhonchi Abdominal: Soft, non tender, non distended, bowel sounds +, no guarding Extremities: no edema, no cyanosis, pulses palpable bilaterally DP and PT Neuro: Grossly nonfocal  Discharge Instructions  Discharge Orders   Future Appointments Provider Department Dept Phone   07/25/2012 2:35 PM [facility] [id number] [facility] [phone]   07/26/2012 2:30 PM [facility] [id number] [facility] [phone]  07/27/2012 2:35 PM [facility] [id number] [place] [phone]   07/28/2012 12:45 PM [facility] [id number] [place] [facility] [phone]    07/29/2012 2:35 PM [facility] [id number] [facility] [phone]   08/01/2012 2:30 PM [facility] [id number] [facility] [phone]   08/02/2012 1:35 PM [facility] [id number] [place] [phone]   08/03/2012 2:30 PM [facility] [id number] [facility] [phone]   08/04/2012 2:30 PM [facility] [id number] [facility] [phone]   Future Orders Complete By Expires     Call MD for:  difficulty breathing, headache or visual disturbances  As directed     Call MD for:  persistant dizziness or light-headedness  As directed     Call MD for:  persistant nausea and vomiting  As directed     Call MD for:  severe uncontrolled pain  As directed     Diet - low sodium heart healthy  As directed     Increase activity slowly  As directed         Medication List    STOP taking these medications       oxyCODONE 5 MG immediate release tablet  Commonly known as:  Oxy IR/ROXICODONE     oxyCODONE-acetaminophen 5-325 MG per tablet  Commonly known as:  PERCOCET/ROXICET      TAKE these medications       cyclobenzaprine 10 MG tablet  Commonly known as:  FLEXERIL  Take 10 mg by mouth 3 (three) times daily as needed for muscle spasms.     dexamethasone 4 MG tablet  Commonly known as:  DECADRON  Take 2 tablets (8 mg total) by mouth every 12 (twelve) hours.     docusate sodium 100 MG capsule  Commonly known as:  COLACE  Take 1 capsule (100 mg total) by mouth 2 (two) times daily as needed for constipation.     everolimus 5 MG tablet  Commonly known as:  AFINITOR  Take 5 mg by mouth daily.     exemestane 25 MG tablet  Commonly known as:  AROMASIN  Take 25 mg by mouth daily after breakfast.     ferrous sulfate 325 (65 FE) MG tablet  Take 325 mg by mouth 3 (three) times daily after meals.     HYDROmorphone 4 MG tablet  Commonly known as:   DILAUDID  Take 1 tablet (4 mg total) by mouth every 4 (four) hours as needed for pain.     ondansetron 4 MG tablet  Commonly known as:  ZOFRAN  Take 4 mg by mouth every 6 (six) hours.     OxyCODONE 10 mg T12a  Commonly known as:  OXYCONTIN  Take 1 tablet (10 mg total) by mouth 3 (three) times daily as needed.     oxyCODONE-acetaminophen 10-325 MG per tablet  Commonly known as:  PERCOCET  Take 1 tablet by mouth every 4 (four) hours as needed for pain.     sennosides-docusate sodium 8.6-50 MG tablet  Commonly known as:  SENOKOT-S  Take 1 tablet by mouth daily as needed for constipation.     zolpidem 5 MG tablet  Commonly known as:  AMBIEN  Take 1 tablet (5 mg total) by mouth at bedtime as needed for sleep.          The results of significant diagnostics from this hospitalization (including imaging, microbiology, ancillary and laboratory) are listed below for  reference.    Significant Diagnostic Studies: Mr Thoracic Spine W Wo Contrast  07/21/2012  *RADIOLOGY REPORT*  Clinical Data: Breast cancer with recurrent back pain.  Prior thoracic decompression.  MRI THORACIC SPINE WITHOUT AND WITH CONTRAST  Technique:  Multiplanar and multiecho pulse sequences of the thoracic spine were obtained without and with intravenous contrast.  Contrast: 12mL MULTIHANCE GADOBENATE DIMEGLUMINE 529 MG/ML IV SOLN  Comparison: MRI 03/21/2011.  CT chest 04/20/2012.  Findings: The patient has undergone previous T9-T11 thoracic laminectomy with epidural tumor resection.  The surgical area is well decompressed without residual or recurrent thoracic cord compression.  Since the prior MR, there has developed widespread osseous disease. Post XRT changes are noted in the marrow admixed with areas of tumor; some lesions are sclerotic, for instance T9, T10, and T11.  There is epidural tumor which is compressing the thoracic cord from T4-T7.  This epidural tumor is greater on the left and is associated with a moderately  large left T5 paravertebral mass (image 15 series 12); the cord is squeezed from side-to-side although the epidural tumor is greater on the left.  The cord is also displaced from left to right.  There is slight increased cord signal at the T4 and T5 levels.  Slight epidural tumor at T10 and T1 is non compressive.  IMPRESSION: Widespread osseous metastatic disease as described.  Interval development since 2012 of new epidural tumor extending from T4-T7 which compresses the thoracic cord and displaces it from left to right as described above.  Moderate left paravertebral mass maximal at T5.  Satisfactory appearance status post thoracic laminectomy and decompression T9-T11.  Findings discussed with Dr. Lynelle Doctor shortly after completion of the study.   Original Report Authenticated By: Davonna Belling, M.D.    Ct Abdomen Pelvis W Contrast  07/07/2012  *RADIOLOGY REPORT*  Clinical Data: Abdominal pain, nausea and vomiting; history of metastatic breast cancer with bone and brain metastases  CT ABDOMEN AND PELVIS WITH CONTRAST  Technique:  Multidetector CT imaging of the abdomen and pelvis was performed following the standard protocol during bolus administration of intravenous contrast.  Contrast: 50mL OMNIPAQUE IOHEXOL 300 MG/ML  SOLN, 80mL OMNIPAQUE IOHEXOL 300 MG/ML  SOLN  Comparison: CT abdomen pelvis - 04/20/2012  Findings:  Limited visualization of lower thorax is made of focal airspace opacity or pleural effusion.  Normal heart size.  No pericardial effusion.  Continued progression of extensive hepatic metastatic disease with dominant mass within the dome of the right lobe the liver now measuring approximately 10.8 x 9.7 cm of image 15, series 2, previously, 9.2 x 8.5 cm.  Near the entirety of the left lobe is expanded and replaced by metastatic disease with definable partially exophytic nodule arising from the inferior aspect of the medial segment of the left lobe of the liver now measuring approximately 5.4 x 6.2 cm  (image 42) previously, 4.1 x 5.1 cm. Hepatomegaly again results in mass effect upon the adjacent organs. The portal vein remains patent.  No ascites.  There is symmetric enhancement and excretion of the bilateral kidneys.  Suspected punctate (110 mm) nonobstructing stone within the superior pole left kidney within the 35, series 2).  No definite right-sided renal stones.  No urinary obstruction or discrete renal lesion.  There is mild diffuse thickening of the left adrenal gland without discrete nodule.  Normal appearance of the right adrenal gland.  Normal appearance of the pancreas and spleen.  Incidental note is made of a small splenule.  Moderate to large  colonic stool burden without evidence of obstruction.  No pneumoperitoneum, pneumatosis or portal venous gas.  Scattered atherosclerotic calcifications within a normal caliber abdominal aorta.  The major branch vessels of the abdominal aorta are patent on this non CT examination.  No definite retroperitoneal, mesenteric, pelvic or inguinal lymphadenopathy the evaluation of the pelvis is degraded secondary to streak artifact from the right hemiarthroplasty.  Multiple partially calcified uterine fibroids.  No discrete adnexal lesion.  No free fluid in the pelvis.  Grossly unchanged extensive osseous metastatic disease throughout the imaged thoracolumbar spine with dense sclerotic metastases involving the T9, T10 and T11 vertebral bodies most conspicuously. Post decompressive laminectomies at T9 - T11. Epidural extension of disease is suspected as posterior to the L3 vertebral body (axial image 41, series 2 as well as posterior to the L1 vertebral body (axial image 28, series 2).  Likely interval progression of mixed lytic sclerotic lesion within the left ilium, now measuring 2.6 x 1.7 cm (image 59, series 2, previously, approximately 2.3 x 1.7 cm. Post right hip hemiarthroplasty.  No definite pathologic fractures.  IMPRESSION: 1.  Continued progression of  extensive hepatic metastatic disease with resultant increased hepatomegaly resulting in mass effect upon the adjacent organs. 2.  Moderate to large colonic stool burden without evidence of obstruction. 3.  Grossly unchanged extensive osseous metastatic disease within the thoracolumbar spine with likely unchanged epidural involvement posterior to the L1 and L3 vertebral bodies.  4.  Suspected progression of mixed lytic/sclerotic lesion within the left ilium.   Original Report Authenticated By: Tacey Ruiz, MD    Dg Abd Acute W/chest  07/07/2012  *RADIOLOGY REPORT*  Clinical Data: Abdominal pain, nausea and vomiting, evaluate for small bowel obstruction and free air  ACUTE ABDOMEN SERIES (ABDOMEN 2 VIEW & CHEST 1 VIEW)  Comparison: 06/15/2012; chest radiograph - 02/15/2012; CT abdomen pelvis - 04/20/2012  Findings:  Grossly unchanged cardiac silhouette and mediastinal contours with mild tortuosity of the thoracic aorta.  No focal parenchymal opacities.  There is persistent mild elevation of the right hemidiaphragm.  No pleural effusion or pneumothorax.  Moderate to large colonic stool burden without evidence of obstruction.  No pneumoperitoneum, pneumatosis or portal venous gas.  Post right hip hemiarthroplasty.  Surgical clips overlie the right axilla. Post right-sided mastectomy.  IMPRESSION: 1.  No acute cardiopulmonary disease. 2.  Moderate to large colonic stool burden without evidence of obstruction.  No pneumoperitoneum.   Original Report Authenticated By: Tacey Ruiz, MD     Microbiology: No results found for this or any previous visit (from the past 240 hour(s)).   Labs: Basic Metabolic Panel:  Recent Labs Lab 07/21/12 1604 07/22/12 0350  NA 140 137  K 3.8 3.8  CL 107 104  CO2  --  21  GLUCOSE 95 144*  BUN 8 9  CREATININE 0.90 0.68  CALCIUM  --  8.9   Liver Function Tests: No results found for this basename: AST, ALT, ALKPHOS, BILITOT, PROT, ALBUMIN,  in the last 168 hours No  results found for this basename: LIPASE, AMYLASE,  in the last 168 hours No results found for this basename: AMMONIA,  in the last 168 hours CBC:  Recent Labs Lab 07/21/12 1500 07/21/12 1604 07/22/12 0350  WBC 4.7  --  3.5*  NEUTROABS 3.4  --   --   HGB 9.1* 9.9* 8.1*  HCT 28.9* 29.0* 25.2*  MCV 80.3  --  81.6  PLT 474*  --  365   Cardiac  Enzymes: No results found for this basename: CKTOTAL, CKMB, CKMBINDEX, TROPONINI,  in the last 168 hours BNP: BNP (last 3 results) No results found for this basename: PROBNP,  in the last 8760 hours CBG: No results found for this basename: GLUCAP,  in the last 168 hours  Time coordinating discharge: Over 30 minutes  Signed:  Manson Passey, MD  [facility] 07/23/2012, 8:00 AM  Pager #: [phone]

## 2012-07-23 NOTE — Progress Notes (Signed)
[name] and is going home in today. She is ready to go home. She started her radiation on Friday. She is her her appointments for next week already.  She feels that her pain is doing better. She is on OxyContin 30 mg twice a day with the Dilaudid for breakthrough pain. She's also on Decadron. I told her that she is to continue this through the radiation and then this will be tapered. She's also is on Ambien to help her rest.  Her appetite is doing good. There's no nausea vomiting.  From yesterday, her hemoglobin is down at 8.1. This will need to be monitored as an outpatient. Her renal function looks good. Calcium is 8.9.  Her vital signs are all stable. Temperature is 98.5. Blood pressure 130/72. Lungs are clear bilaterally. Cardiac exam somewhat tachycardic but regular. Abdomen is soft. She has come ever get extremities.  Again, she'll be discharged today. I'm sure that there is followup with Dr. Darnelle Catalan as an outpatient. She'll be going to radiation every day.  Pete e.

## 2012-07-25 ENCOUNTER — Ambulatory Visit
Admit: 2012-07-25 | Discharge: 2012-07-25 | Disposition: A | Discharge status: Home / Self Care | Payer: Medicaid Other | Attending: Radiation Oncology | Admitting: Radiation Oncology

## 2012-07-25 ENCOUNTER — Other Ambulatory Visit: Payer: Self-pay | Admitting: Oncology

## 2012-07-25 NOTE — Progress Notes (Signed)
Appointment canceled.  Patient currently inpatient.

## 2012-07-26 ENCOUNTER — Ambulatory Visit: Payer: Medicaid Other

## 2012-07-27 ENCOUNTER — Telehealth: Payer: Self-pay | Admitting: *Deleted

## 2012-07-27 ENCOUNTER — Ambulatory Visit
Admit: 2012-07-27 | Discharge: 2012-07-27 | Disposition: A | Discharge status: Home / Self Care | Payer: Medicaid Other | Attending: Radiation Oncology | Admitting: Radiation Oncology

## 2012-07-27 ENCOUNTER — Telehealth: Payer: Self-pay | Admitting: Oncology

## 2012-07-27 ENCOUNTER — Ambulatory Visit: Payer: Medicaid Other

## 2012-07-27 ENCOUNTER — Other Ambulatory Visit: Payer: Medicaid Other | Admitting: Lab

## 2012-07-27 NOTE — Telephone Encounter (Signed)
S/W THE PT AND SHE IS AWARE OF HER F/U APPT WITH THE PA ON 08/03/2012 TO FOLLOW THE RAD ONC APPT.

## 2012-07-27 NOTE — Telephone Encounter (Signed)
Called pt re: no show for radiation treatment yesterday. Pt's phone unable to accept messages. Will continue to try and reach patient.

## 2012-07-28 ENCOUNTER — Ambulatory Visit: Payer: Medicaid Other

## 2012-07-29 ENCOUNTER — Other Ambulatory Visit: Payer: Self-pay | Admitting: *Deleted

## 2012-07-29 ENCOUNTER — Ambulatory Visit
Admission: RE | Admit: 2012-07-29 | Discharge: 2012-07-29 | Disposition: A | Discharge status: Home / Self Care | Payer: Medicaid Other | Source: Ambulatory Visit | Attending: Radiation Oncology | Admitting: Radiation Oncology

## 2012-07-29 DIAGNOSIS — C50919 Malignant neoplasm of unspecified site of unspecified female breast: Secondary | ICD-10-CM

## 2012-07-29 MED ORDER — OXYCODONE-ACETAMINOPHEN 10-325 MG PO TABS: 1.0000 | ORAL_TABLET | ORAL | Status: DC | PRN

## 2012-07-29 MED ORDER — SUCRALFATE 1 GM/10ML PO SUSP: 1.0000 g | Freq: Four times a day (QID) | ORAL | Status: DC

## 2012-07-29 MED ORDER — OXYCODONE HCL ER 60 MG PO T12A: 1.0000 | EXTENDED_RELEASE_TABLET | Freq: Two times a day (BID) | ORAL | Status: DC

## 2012-07-29 NOTE — Progress Notes (Signed)
Patient d/c'd from hospital 07/2012.Called [facility] to confirm patient only had rx for dilaudid on 07/23/12.Oxycontin 20 mg 1 po q12h last given on 06/21/12 and Percocet 10/325 1 po tid prn last given on 07/11/12.

## 2012-07-29 NOTE — Telephone Encounter (Signed)
Pt came up post radiation to obtain refill on pain medication.  Noted pt's discharge medications stating scripts given for diluadid, oxycontin and percocet.  Per call to pt's pharmacy only pain medication dispensed was diluadid with other medications of decadron and ambien.  Per discussion with pt she states she has approximately 18 oxycontin left of 10mg - she states pain is severe and therefore she is not using her medications as prescribed. She states she is using up to 3 tablets of the 10mg  oxycontin " several times a day " as well as percocet.  This RN discussed with pt above use nonbenficial with oxycontin for quick relief due to it is made to release slowly. Velmer stated " I know but I am in so much pain I just take the medications hoping it will help.   Pt also states discomfort in throat " like before - it's a burning " while pointing to mid sternum ". Pt states she was on medication last year for same symptom and is requesting a refill.  This RN discussed with MD above pain issues and obtained prescriptions for percocet refill and a new order for oxycontin with dose of 60mg  bid. Order for carafate called to pt's pharmacy.  Pt was instructed of dose change with oxycontin and concerns relating to taking not as directed could have severe side effects.

## 2012-08-01 ENCOUNTER — Encounter: Payer: Self-pay | Admitting: *Deleted

## 2012-08-01 ENCOUNTER — Encounter: Payer: Self-pay | Admitting: Radiation Oncology

## 2012-08-01 ENCOUNTER — Ambulatory Visit
Admit: 2012-08-01 | Discharge: 2012-08-01 | Disposition: A | Discharge status: Home / Self Care | Payer: Medicaid Other | Attending: Radiation Oncology | Admitting: Radiation Oncology

## 2012-08-01 NOTE — Progress Notes (Signed)
Weekly Management Note:  Site: Thoracic spine (T4-T7) Current Dose:  1500  cGy Projected Dose: 3000  cGy  Narrative: The patient is seen today for routine under treatment assessment. CBCT/MVCT images/port films were reviewed. The chart was reviewed.   She seen today for her first week of radiation therapy to her thoracic spine. She still complains of left inferior scapular discomfort for which she takes OxyContin and Percocet when necessary.  Physical Examination: There were no vitals filed for this visit..  Weight:  . There is pain described along the inferior aspect of her left scapula but there is no palpable discomfort.  Impression: Tolerating radiation therapy well. I suspect that her pain is coming from her thoracic spine region/left paravertebral disease. Further evaluation by Dr. Michell Heinrich to see if her symptoms are any different which would merit perhaps a bone scan.  Plan: Continue radiation therapy as planned. She'll see Dr. Michell Heinrich tomorrow

## 2012-08-01 NOTE — Progress Notes (Signed)
CSW received call from patient that she is once again having issues with [facility] transportation picking her up in time for her radiation appointments.  CSW will make another complaint to [employer] regarding issues with services.  [name] asked, "does that mean you cannot get me to my appointment today?".  CSW stated that is the only resource available to patient at this time.  [name] requested a gas card to provide to a friend to transport her to appointments. CSW advised patient meet with a financial counselor to determine if she has any funds for gas card.  Patient believes she is out of [facility] funds and would not qualify for gas card.  Kathrin Penner, MSW, LCSW Clinical Social Worker [facility] [phone]

## 2012-08-02 ENCOUNTER — Ambulatory Visit: Payer: Medicaid Other

## 2012-08-03 ENCOUNTER — Encounter: Payer: Self-pay | Admitting: Physician Assistant

## 2012-08-03 ENCOUNTER — Ambulatory Visit
Admit: 2012-08-03 | Discharge: 2012-08-03 | Disposition: A | Discharge status: Home / Self Care | Payer: Medicaid Other | Attending: Radiation Oncology | Admitting: Radiation Oncology

## 2012-08-03 ENCOUNTER — Ambulatory Visit (HOSPITAL_BASED_OUTPATIENT_CLINIC_OR_DEPARTMENT_OTHER): Payer: Medicaid Other | Admitting: Lab

## 2012-08-03 ENCOUNTER — Telehealth: Payer: Self-pay | Admitting: Oncology

## 2012-08-03 ENCOUNTER — Ambulatory Visit (HOSPITAL_BASED_OUTPATIENT_CLINIC_OR_DEPARTMENT_OTHER): Payer: Medicaid Other | Admitting: Physician Assistant

## 2012-08-03 VITALS — BP 124/80 | HR 109 | Temp 98.8°F | Resp 20 | Ht 62.0 in | Wt 133.1 lb

## 2012-08-03 DIAGNOSIS — R0602 Shortness of breath: Secondary | ICD-10-CM

## 2012-08-03 DIAGNOSIS — M549 Dorsalgia, unspecified: Secondary | ICD-10-CM

## 2012-08-03 DIAGNOSIS — C7951 Secondary malignant neoplasm of bone: Secondary | ICD-10-CM

## 2012-08-03 DIAGNOSIS — C50919 Malignant neoplasm of unspecified site of unspecified female breast: Secondary | ICD-10-CM

## 2012-08-03 DIAGNOSIS — C787 Secondary malignant neoplasm of liver and intrahepatic bile duct: Secondary | ICD-10-CM

## 2012-08-03 DIAGNOSIS — C50519 Malignant neoplasm of lower-outer quadrant of unspecified female breast: Secondary | ICD-10-CM

## 2012-08-03 DIAGNOSIS — C7952 Secondary malignant neoplasm of bone marrow: Secondary | ICD-10-CM

## 2012-08-03 DIAGNOSIS — D649 Anemia, unspecified: Secondary | ICD-10-CM

## 2012-08-03 LAB — CBC WITH DIFFERENTIAL/PLATELET
Basophils Absolute: 0 10*3/uL (ref 0.0–0.1)
Eosinophils Absolute: 0 10*3/uL (ref 0.0–0.5)
HGB: 9.2 g/dL — ABNORMAL LOW (ref 11.6–15.9)
MCV: 78.7 fL — ABNORMAL LOW (ref 79.5–101.0)
MONO#: 0.2 10*3/uL (ref 0.1–0.9)
NEUT#: 11.3 10*3/uL — ABNORMAL HIGH (ref 1.5–6.5)
RBC: 3.5 10*6/uL — ABNORMAL LOW (ref 3.70–5.45)
RDW: 19.9 % — ABNORMAL HIGH (ref 11.2–14.5)
WBC: 12.2 10*3/uL — ABNORMAL HIGH (ref 3.9–10.3)

## 2012-08-03 LAB — COMPREHENSIVE METABOLIC PANEL (CC13)
Albumin: 3.1 g/dL — ABNORMAL LOW (ref 3.5–5.0)
BUN: 9.3 mg/dL (ref 7.0–26.0)
CO2: 27 mEq/L (ref 22–29)
Calcium: 9.4 mg/dL (ref 8.4–10.4)
Chloride: 103 mEq/L (ref 98–107)
Glucose: 209 mg/dl — ABNORMAL HIGH (ref 70–99)
Potassium: 3.2 mEq/L — ABNORMAL LOW (ref 3.5–5.1)
Sodium: 142 mEq/L (ref 136–145)
Total Protein: 6.9 g/dL (ref 6.4–8.3)

## 2012-08-03 MED ORDER — OXYCODONE-ACETAMINOPHEN 5-325 MG PO TABS
2.0000 | ORAL_TABLET | Freq: Once | ORAL | Status: AC
  Administered 2012-08-03: 2 via ORAL

## 2012-08-03 NOTE — Telephone Encounter (Signed)
Pt sent back to lb and given appt schedule for March and April. Pt also given referral for cxr to be done 3/6. [facility] will contact pt re ct.

## 2012-08-03 NOTE — Progress Notes (Signed)
ID: [name]   DOB: November 18, 1965  MR#: [id number]  CSN#:[id number]  HISTORY OF PRESENT ILLNESS: The patient delivered a child on 05/17/2009, after which she felt fullness in the right breast. She eventually brought this to her doctors attention and was scheduled for mammography and ultrasound on 03/08/2009. There were no prior exams for comparison. Mammography showed the breast to be dense, with a 5 cm obscured mass in the outer midportion of the right breast. Mass was easily palpable, and there was also a mass in the outer right periareolar region. They were suspicious microcalcifications in the outer midportion of the right breast, as well as within the mass itself. Ultrasound showed an extensive heterogeneous area, corresponding to the palpable abnormality with multiple solid and cystic foci. Overall, the mass measured in excess of 5 cm, in addition to cysts in the breast.  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 The tumors were ER +100%, PR +80%.  Bilateral breast MRI was obtained 03/22/2009. There was a large area of abnormal enhancement in the lower outer quadrant of the right breast measuring up to 9.7 cm. No abnormal appearing lymph nodes and no other masses in either breast.  Patient underwent definitive right mastectomy and sentinel lymph node sampling on 04/29/2009 under the care of Dr. Derrell Lolling. 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 Repeat prognostic panel showed tumor to be ER +93% and PR +51%. HER-2/neu was negative with a ratio of 0.71, with proliferation marker of 36%.  Patient was evaluated by Dr. Darnelle Catalan in January 2011 but failed to keep appointments until she was seen here again in July 2011. In July, she began adjuvant chemotherapy. She received 4 cycles of dose dense  doxorubicin and cyclophosphamide followed by 4 weekly doses of paclitaxel which was discontinued in November 2011 secondary to peripheral neuropathy. The patient then received radiation therapy to the right chest wall which was truncated due to problems with missing appointments.  Patient was started on tamoxifen briefly in June of 2012 which was discontinued due to nausea. The nausea continued, and an abdominal CT in October 2012 confirmed a growth in liver lesions. These were biopsied on 03/10/2011 and showed metastatic adenocarcinoma, still ER positive at 100%, PR +5%. Her subsequent treatments are as detailed below  INTERVAL HISTORY: [name] returns today for followup of her metastatic breast carcinoma. She continues on exemestane and everolimus.  Since her last appointment here in early February, [name] was hospitalized for back pain. She was discharged on February 22. She is currently receiving  palliative radiation therapy under the care of Dr. Michell Heinrich.   She continues to have pain in the thoracic spine, radiating into the left scapula. Her pain is controlled fairly well with her current pain regimen, which includes oxycodone/APAP, OxyIR, and hydromorphone.   [name] continues to be under quite a bit of stress at home. She's currently living alone, her daughter having recently moved out again. She is still trying to get moved from a 2 story apartment to a one level. She is ambulating slowly with a cane. She is a high fall risk.  REVIEW OF SYSTEMS: Seeley denies any fevers or chills. She's had no skin changes, and denies any abnormal bleeding. Her appetite is reduced and she is having quite a bit of taste alteration. Everything tastes "to salty and metallic". She denies any actual  nausea or emesis. She tends to be constipated but had a good bowel movement yesterday. [name] has noticed an increased cough, typically nonproductive, along with increased shortness of breath. She denies chest pain or  palpitations. She has occasional headaches. She continues on dexamethasone, 4 mg twice daily. She's had no significant peripheral swelling.  A detailed review of systems is otherwise stable and noncontributory.   PAST MEDICAL HISTORY: Past Medical History  Diagnosis Date  . Hypertension   . GERD (gastroesophageal reflux disease)   . 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 Pyelonephritis 10/01/11  . Recurrent UTI (urinary tract infection) 10/01/11  . S/P chemotherapy, time since 4-12 weeks   . S/P radiation > 12 weeks 03/15/12- 04/07/12    right hip, clivus  . 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 Brain cancer     spread from breast ca  . Hypercalcemia 06/21/2012  . History of radiation therapy 08/06/10 -09/16/10    R supraclav fossa, R chest wall, R drain site    PAST SURGICAL HISTORY: Past Surgical History  Procedure Laterality Date  . Cesarean section  1989; 2010  . Mastectomy  2011    right  . Back surgery  2012    "removed tumor,cancer, from my spine  . Port-a-cath removal  2012    right chest  . Portacath placement  2011    right  . Tubal ligation  2010  . Breast biopsy  2011    right  . Hip arthroplasty  05/04/2012    Procedure: ARTHROPLASTY BIPOLAR HIP;  Surgeon: Shelda Pal, MD;  Location: [facility];  Service: Orthopedics;  Laterality: Right;  RIGHT HIP HEMI ARTHROPLASTY     FAMILY HISTORY The patient's father is alive at age 48.  The patient's mother is alive at age 48.  She has four sisters.  She had one brother who died from colon cancer at the age of 49.  There is no breast or ovarian cancer history in the family to her knowledge.  GYNECOLOGIC HISTORY: She is GX P2.  First pregnancy to term at age 13.    SOCIAL HISTORY: The patient is not employed.  She lives by herself with her small son whose name is [name] (he is currently staying with his dad in [place]).  Her first child, [name], is studying business in this area.   The patient has one grandchild.  She attends the [employer].    ADVANCED DIRECTIVES: not in place  HEALTH MAINTENANCE: History  Substance Use Topics  . Smoking status: Former Smoker -- 0.25 packs/day for 31 years    Types: Cigarettes    Quit date: 04/26/2012  . Smokeless tobacco: Never Used  . Alcohol Use: No     Colonoscopy:  PAP:  Bone density:  Lipid panel:  No Known Allergies  Current Outpatient Prescriptions  Medication Sig Dispense Refill  . cyclobenzaprine (FLEXERIL) 10 MG tablet Take 10 mg by mouth 3 (three) times daily as needed for muscle spasms.      [name] dexamethasone (DECADRON) 4 MG tablet Take 2 tablets (8 mg total) by mouth every 12 (twelve) hours.  60 tablet  0  . docusate sodium (COLACE) 100 MG capsule Take 1 capsule (100 mg total) by mouth 2 (two) times daily as needed for constipation.  10 capsule  0  . everolimus (AFINITOR) 5 MG tablet Take 5 mg by mouth daily.      [name]  exemestane (AROMASIN) 25 MG tablet Take 25 mg by mouth daily after breakfast.      . ferrous sulfate 325 (65 FE) MG tablet Take 325 mg by mouth 3 (three) times daily after meals.      [name] HYDROmorphone (DILAUDID) 4 MG tablet Take 1 tablet (4 mg total) by mouth every 4 (four) hours as needed for pain.  60 tablet  0  . ondansetron (ZOFRAN) 4 MG tablet Take 4 mg by mouth every 6 (six) hours.      . OxyCODONE HCl ER (OXYCONTIN) 60 MG T12A Take 1 tablet by mouth 2 (two) times daily.  60 each  0  . oxyCODONE-acetaminophen (PERCOCET) 10-325 MG per tablet Take 1 tablet by mouth every 4 (four) hours as needed for pain.  120 tablet  0  . sennosides-docusate sodium (SENOKOT-S) 8.6-50 MG tablet Take 1 tablet by mouth daily as needed for constipation.  20 tablet  0  . sucralfate (CARAFATE) 1 GM/10ML suspension Take 10 mLs (1 g total) by mouth 4 (four) times daily.  420 mL  14  . zolpidem (AMBIEN) 5 MG tablet Take 1 tablet (5 mg total) by mouth at bedtime as needed for sleep.  30 tablet  1  . [DISCONTINUED]  Alum & Mag Hydroxide-Simeth (MAGIC MOUTHWASH W/LIDOCAINE) SOLN Take 5 mLs by mouth 3 (three) times daily as needed (take 15 minutes prior to meals.).  480 mL  0   No current facility-administered medications for this visit.    OBJECTIVE: Middle-aged [place] American woman who appears tired and weak   Filed Vitals:   08/03/12 1500  BP: 124/80  Pulse: 109  Temp: 98.8 F (37.1 C)  Resp: 20     Body mass index is 24.34 kg/(m^2).    ECOG: 2 Filed Weights   08/03/12 1500  Weight: 133 lb 1.6 oz (60.374 kg)   Physical Exam: HEENT:  Sclerae anicteric.  Oropharynx clear.  Nodes:  No cervical or supraclavicular lymphadenopathy palpated.  Breast Exam: Deferred Lungs: Diminished breath sounds bibasilar, but no frank crackles or wheezes  Heart:  Regular rate and rhythm.   Abdomen:  Distended, firm to palpation, and mildly tender diffusely.  Positive bowel sounds.  Musculoskeletal:  Focal spinal tenderness primarily in the thoracic spine with gentle palpation  Extremities:  Nonpitting pedal edema, equal bilaterally, with no upper extremity edema noted Neuro:  Nonfocal. Well oriented with anxious affect   LAB RESULTS: Lab Results  Component Value Date   WBC 3.5* 07/22/2012   NEUTROABS 3.4 07/21/2012   HGB 8.1* 07/22/2012   HCT 25.2* 07/22/2012   MCV 81.6 07/22/2012   PLT 365 07/22/2012      Chemistry      Component Value Date/Time   NA 137 07/22/2012 0350   NA 139 07/11/2012 1043   K 3.8 07/22/2012 0350   K 4.3 07/11/2012 1043   CL 104 07/22/2012 0350   CL 104 07/11/2012 1043   CO2 21 07/22/2012 0350   CO2 27 07/11/2012 1043   BUN 9 07/22/2012 0350   BUN 9.5 07/11/2012 1043   CREATININE 0.68 07/22/2012 0350   CREATININE 0.9 07/11/2012 1043      Component Value Date/Time   CALCIUM 8.9 07/22/2012 0350   CALCIUM 9.8 07/11/2012 1043   ALKPHOS 401* 07/11/2012 1043   ALKPHOS 358* 07/07/2012 1740   AST 45* 07/11/2012 1043   AST 34 07/07/2012 1740   ALT 10 07/11/2012 1043   ALT 9 07/07/2012 1740    BILITOT 0.38  07/11/2012 1043   BILITOT 0.5 07/07/2012 1740       Lab Results  Component Value Date   LABCA2 89* 06/09/2012    STUDIES:  Dg Thoracic Spine 2 View  05/31/2012  *RADIOLOGY REPORT*  Clinical Data: Back pain and weakness.  THORACIC SPINE - 2 VIEW  Comparison: CT scan 04/20/2012.  Findings: The thoracic vertebral bodies are normally aligned.  No acute compression fracture.  Stable sclerotic metastatic bone disease.  IMPRESSION:  1.  No acute bony findings. 2.  Stable sclerotic metastatic bone disease.   Original Report Authenticated By: Rudie Meyer, M.D.    Dg Lumbar Spine Complete  05/31/2012  *RADIOLOGY REPORT*  Clinical Data: Breast cancer with osseous metastatic disease.  Back pain.  LUMBAR SPINE - COMPLETE 4+ VIEW  Comparison: Multiple exams, including 04/20/2012 and 03/18/2012  Findings: T9-T11 laminectomy noted, with bony sclerosis in this vicinity compatible with the vertebral metastatic disease.  Lateral demineralization of the right L1 pedicle and sclerosis of the right L2 pedicle likely reflect the underlying malignancy, but overall the osseous metastatic disease is less apparent than on the prior CT scan.  No interval collapse or subluxation.  IMPRESSION:  1.  Reduced conspicuity of the osseous metastatic lesions at L1-L3 compared to prior CT scan, although demineralization of the right L1 pedicle and sclerosis of the right L2 pedicle are compatible with metastatic disease. 2.  Sclerosis in the lower thoracic spine compatible with metastatic disease, with T9-T11 laminectomy noted. 3.  No interval vertebral body collapse or subluxation.   Original Report Authenticated By: Gaylyn Rong, M.D.    Ct Head Wo Contrast  06/15/2012  *RADIOLOGY REPORT*  Clinical Data: Headache.  Breast cancer.  Nausea.  The  CT HEAD WITHOUT CONTRAST  Technique:  Contiguous axial images were obtained from the base of the skull through the vertex without contrast.  Comparison: 02/20/2012.   02/15/2012.  Findings: The brain has a normal appearance without evidence of atrophy, old or acute infarction, mass lesion, hemorrhage, hydrocephalus or extra-axial collection.  Previously seen destructive lesion of the clivus with extension into the inferior sphenoid sinus has been treated and appears healed.  No ongoing destructive process is evident in the skull base or anywhere in the calvarium.  Visualized sinuses, middle ears and mastoids are clear.  IMPRESSION: No intracranial abnormality.  Treatment of a previous clival metastasis.  No evidence of residual or recurrent active tumor in that region.   Original Report Authenticated By: Paulina Fusi, M.D.    Dg Abd Acute W/chest  06/15/2012  *RADIOLOGY REPORT*  Clinical Data: Abdominal pain, possible bowel obstruction, weakness  ACUTE ABDOMEN SERIES (ABDOMEN 2 VIEW & CHEST 1 VIEW)  Comparison: Chest x-ray of 02/15/2012 and CT chest of 04/20/2012  Findings: No active infiltrate or effusion is seen.  Mediastinal contours are stable.  The heart is within normal limits in size.  Supine and erect views of the abdomen show a somewhat fluid distended stomach.  However no other evidence of bowel obstruction is seen.  Is there any clinical suspicion of gastric outlet obstruction?  No free air is seen.  A right hip hemiarthroplasty is noted.  IMPRESSION:  1.  No bowel obstruction.  There is fluid distention of the stomach however and gastric outlet obstruction cannot be excluded.  No free air. 2.  No active lung disease.   Original Report Authenticated By: Dwyane Dee, M.D.    ASSESSMENT:   47 y.o.  [place] woman    1. Status post  right mastectomy and axillary lymph node dissection November 2010 for a T3 N1(mic) Stage IIIA invasive ductal carcinoma, grade 3, estrogen and progesterone receptor positive, HER-2/neu negative, with an MIB-1 of 36%.    2.Status post adjuvant chemotherapy July to November 2011, consisting of 4 cycles of dose dense doxorubicin and  cyclophosphamide, followed by 4 of 12 planned weekly doses of paclitaxel given adjuvantly, discontinued secondary to peripheral neuropathy.    3. Status post radiation to the right chest wall, truncated due to problems with the patient missing appointments.   4. Tamoxifen started June of 2012, but discontinued due to nausea.    5. metastatic disease to liver and bone pathologically documented October of 2012, estrogen receptor 100% positive, progesterone receptor 7% positive, with no HER-2 amplification;  treatment in the metastatic setting has consisted of:   6.Faslodex started 03/16/2011, discontinued November 2013 with progression. Zoladex every 3 months also started on 03/16/2011, and monthly zoledronic acid started November 2011, the Zometa I currently held due to pending dental procedures and possible extractions.             7. Status post thoracic laminectomy, T9-T11, for impending cord compression on 03/21/2011, followed by irradiation to the spine completed November 2011  8. 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 s/p palliative radiation to the Right hip clivus to 30 Gy completed 04/07/2012  10.  Progression noted by scans in November 2013, at which time Faslodex was discontinued. Patient was started on exemestane in early December, with everolimus started in mid December at 5 mg daily. Continues to receive Zoladex injections every 3 months, last given 05/13/2013..  11. Nondisplaced right femoral neck fracture, status post right hip arthroplasty on 05/04/2012 under the care of Dr.Matt [facility].  12. Hypercalcemia with elevated serum creatinine: resolved  13.  Receiving palliative radiation therapy for cord compression       PLAN:  Ashlei will Mullins back to the lab today to check a CBC and metabolic panel. She'll continue with radiation as scheduled. She's also due  for her next Zoladex injection later this week, and I will schedule that for Friday, March 7.  I am concerned about her recent increased shortness of breath and cough due to the fact that she has been on  Everolimus.   I have asked her to hold that medication pending a chest x-ray tomorrow, and we will assess whether further measures need to be taken.   She is also being scheduled for restaging scans later this month, after which she will see Dr. Darnelle Catalan to review her treatment plan. These will include CTs of the chest, abdomen, and pelvis.  Evalette voices understanding and agreement with the above, she noticed called any changes or problems.  Dr. Darnelle Catalan has discussed her situation with Dr. Gibson Ramp from hospice. Because she is receiving life-prolonging therapy,  she does not currently qualify for hospice support. He has discussed with the patient that if after 2 months there is no evidence of response, we would stop the everolimus, and consider chemotherapy. She understands the chemotherapy would be unlikely to cause a significant response, and therefore at that point likely she would prefer a purely palliative care and would meet hospice criteria.  Patient has a DO NOT RESUSCITATE order.  [name],AMY    08/03/2012

## 2012-08-04 ENCOUNTER — Encounter (HOSPITAL_COMMUNITY): Payer: Self-pay | Admitting: Radiology

## 2012-08-04 ENCOUNTER — Telehealth: Payer: Self-pay | Admitting: *Deleted

## 2012-08-04 ENCOUNTER — Inpatient Hospital Stay (HOSPITAL_COMMUNITY)
Admission: EM | Admit: 2012-08-04 | Discharge: 2012-08-10 | DRG: 542 | Disposition: A | Discharge status: Home / Self Care | Payer: Medicaid Other | Attending: Internal Medicine | Admitting: Internal Medicine

## 2012-08-04 ENCOUNTER — Ambulatory Visit: Payer: Medicaid Other

## 2012-08-04 ENCOUNTER — Telehealth: Payer: Self-pay | Admitting: Oncology

## 2012-08-04 ENCOUNTER — Ambulatory Visit (HOSPITAL_COMMUNITY): Payer: Medicaid Other

## 2012-08-04 ENCOUNTER — Other Ambulatory Visit: Payer: Self-pay

## 2012-08-04 DIAGNOSIS — C719 Malignant neoplasm of brain, unspecified: Secondary | ICD-10-CM

## 2012-08-04 DIAGNOSIS — K5900 Constipation, unspecified: Secondary | ICD-10-CM

## 2012-08-04 DIAGNOSIS — D6481 Anemia due to antineoplastic chemotherapy: Secondary | ICD-10-CM | POA: Diagnosis present

## 2012-08-04 DIAGNOSIS — C787 Secondary malignant neoplasm of liver and intrahepatic bile duct: Secondary | ICD-10-CM

## 2012-08-04 DIAGNOSIS — C50919 Malignant neoplasm of unspecified site of unspecified female breast: Secondary | ICD-10-CM

## 2012-08-04 DIAGNOSIS — C7952 Secondary malignant neoplasm of bone marrow: Principal | ICD-10-CM | POA: Diagnosis present

## 2012-08-04 DIAGNOSIS — J189 Pneumonia, unspecified organism: Secondary | ICD-10-CM

## 2012-08-04 DIAGNOSIS — Z9221 Personal history of antineoplastic chemotherapy: Secondary | ICD-10-CM

## 2012-08-04 DIAGNOSIS — J1000 Influenza due to other identified influenza virus with unspecified type of pneumonia: Secondary | ICD-10-CM | POA: Diagnosis present

## 2012-08-04 DIAGNOSIS — D649 Anemia, unspecified: Secondary | ICD-10-CM | POA: Diagnosis present

## 2012-08-04 DIAGNOSIS — J1100 Influenza due to unidentified influenza virus with unspecified type of pneumonia: Secondary | ICD-10-CM

## 2012-08-04 DIAGNOSIS — T451X5A Adverse effect of antineoplastic and immunosuppressive drugs, initial encounter: Secondary | ICD-10-CM | POA: Diagnosis present

## 2012-08-04 DIAGNOSIS — I10 Essential (primary) hypertension: Secondary | ICD-10-CM

## 2012-08-04 DIAGNOSIS — R000 Tachycardia, unspecified: Secondary | ICD-10-CM

## 2012-08-04 DIAGNOSIS — K3189 Other diseases of stomach and duodenum: Secondary | ICD-10-CM | POA: Diagnosis present

## 2012-08-04 DIAGNOSIS — Z9010 Acquired absence of unspecified breast and nipple: Secondary | ICD-10-CM

## 2012-08-04 DIAGNOSIS — M8448XA Pathological fracture, other site, initial encounter for fracture: Secondary | ICD-10-CM

## 2012-08-04 DIAGNOSIS — J101 Influenza due to other identified influenza virus with other respiratory manifestations: Secondary | ICD-10-CM | POA: Diagnosis present

## 2012-08-04 DIAGNOSIS — Z87891 Personal history of nicotine dependence: Secondary | ICD-10-CM

## 2012-08-04 DIAGNOSIS — Z79899 Other long term (current) drug therapy: Secondary | ICD-10-CM

## 2012-08-04 DIAGNOSIS — C7951 Secondary malignant neoplasm of bone: Principal | ICD-10-CM

## 2012-08-04 DIAGNOSIS — M549 Dorsalgia, unspecified: Secondary | ICD-10-CM

## 2012-08-04 DIAGNOSIS — K219 Gastro-esophageal reflux disease without esophagitis: Secondary | ICD-10-CM

## 2012-08-04 DIAGNOSIS — J90 Pleural effusion, not elsewhere classified: Secondary | ICD-10-CM | POA: Diagnosis present

## 2012-08-04 DIAGNOSIS — R109 Unspecified abdominal pain: Secondary | ICD-10-CM

## 2012-08-04 DIAGNOSIS — B370 Candidal stomatitis: Secondary | ICD-10-CM

## 2012-08-04 DIAGNOSIS — C7931 Secondary malignant neoplasm of brain: Secondary | ICD-10-CM | POA: Diagnosis present

## 2012-08-04 DIAGNOSIS — Z66 Do not resuscitate: Secondary | ICD-10-CM | POA: Diagnosis present

## 2012-08-04 DIAGNOSIS — Z923 Personal history of irradiation: Secondary | ICD-10-CM

## 2012-08-04 DIAGNOSIS — D630 Anemia in neoplastic disease: Secondary | ICD-10-CM | POA: Diagnosis present

## 2012-08-04 DIAGNOSIS — E876 Hypokalemia: Secondary | ICD-10-CM

## 2012-08-04 LAB — CBC WITH DIFFERENTIAL/PLATELET
Basophils Absolute: 0 10*3/uL (ref 0.0–0.1)
Eosinophils Relative: 0 % (ref 0–5)
Lymphocytes Relative: 4 % — ABNORMAL LOW (ref 12–46)
MCV: 78.9 fL (ref 78.0–100.0)
Neutrophils Relative %: 94 % — ABNORMAL HIGH (ref 43–77)
Platelets: 319 10*3/uL (ref 150–400)
RDW: 18.8 % — ABNORMAL HIGH (ref 11.5–15.5)
WBC: 7.7 10*3/uL (ref 4.0–10.5)

## 2012-08-04 LAB — URINALYSIS, ROUTINE W REFLEX MICROSCOPIC
Hgb urine dipstick: NEGATIVE
Leukocytes, UA: NEGATIVE
Nitrite: NEGATIVE
Protein, ur: NEGATIVE mg/dL
Specific Gravity, Urine: 1.014 (ref 1.005–1.030)
Urobilinogen, UA: 0.2 mg/dL (ref 0.0–1.0)

## 2012-08-04 LAB — LIPASE, BLOOD: Lipase: 7 U/L — ABNORMAL LOW (ref 11–59)

## 2012-08-04 LAB — COMPREHENSIVE METABOLIC PANEL
ALT: 17 U/L (ref 0–35)
AST: 38 U/L — ABNORMAL HIGH (ref 0–37)
CO2: 27 mEq/L (ref 19–32)
Calcium: 8.8 mg/dL (ref 8.4–10.5)
GFR calc non Af Amer: >90 mL/min (ref >90)
Potassium: 3.2 mEq/L — ABNORMAL LOW (ref 3.5–5.1)
Sodium: 139 mEq/L (ref 135–145)
Total Protein: 6.5 g/dL (ref 6.0–8.3)

## 2012-08-04 MED ORDER — ONDANSETRON HCL 4 MG/2ML IJ SOLN
4.0000 mg | Freq: Once | INTRAMUSCULAR | Status: AC
  Administered 2012-08-04: 4 mg via INTRAVENOUS
  Filled 2012-08-04: qty 2

## 2012-08-04 MED ORDER — LEVOFLOXACIN IN D5W 750 MG/150ML IV SOLN
750.0000 mg | INTRAVENOUS | Status: AC
  Administered 2012-08-04 – 2012-08-06 (×3): 750 mg via INTRAVENOUS
  Filled 2012-08-04 (×4): qty 150

## 2012-08-04 MED ORDER — OXYCODONE-ACETAMINOPHEN 10-325 MG PO TABS: 1.0000 | ORAL_TABLET | Freq: Four times a day (QID) | ORAL | Status: DC | PRN

## 2012-08-04 MED ORDER — DOCUSATE SODIUM 100 MG PO CAPS
100.0000 mg | ORAL_CAPSULE | Freq: Two times a day (BID) | ORAL | Status: DC
  Administered 2012-08-04 – 2012-08-10 (×12): 100 mg via ORAL
  Filled 2012-08-04 (×14): qty 1

## 2012-08-04 MED ORDER — VANCOMYCIN HCL IN DEXTROSE 1-5 GM/200ML-% IV SOLN
1000.0000 mg | Freq: Once | INTRAVENOUS | Status: AC
  Administered 2012-08-04: 1000 mg via INTRAVENOUS
  Filled 2012-08-04: qty 200

## 2012-08-04 MED ORDER — ONDANSETRON HCL 4 MG PO TABS: 4.0000 mg | ORAL_TABLET | Freq: Four times a day (QID) | ORAL | Status: DC | PRN

## 2012-08-04 MED ORDER — OXYCODONE HCL 5 MG PO TABS
5.0000 mg | ORAL_TABLET | Freq: Four times a day (QID) | ORAL | Status: DC | PRN
  Administered 2012-08-04 – 2012-08-09 (×5): 5 mg via ORAL
  Filled 2012-08-04 (×5): qty 1

## 2012-08-04 MED ORDER — SODIUM CHLORIDE 0.9 % IV SOLN: INTRAVENOUS | Status: DC

## 2012-08-04 MED ORDER — SODIUM CHLORIDE 0.9 % IV SOLN: 1000.0000 mL | INTRAVENOUS | Status: DC

## 2012-08-04 MED ORDER — PANTOPRAZOLE SODIUM 40 MG IV SOLR
40.0000 mg | Freq: Every day | INTRAVENOUS | Status: DC
  Administered 2012-08-04 – 2012-08-07 (×3): 40 mg via INTRAVENOUS
  Filled 2012-08-04 (×5): qty 40

## 2012-08-04 MED ORDER — SENNOSIDES-DOCUSATE SODIUM 8.6-50 MG PO TABS
1.0000 | ORAL_TABLET | Freq: Every day | ORAL | Status: DC | PRN
  Administered 2012-08-04: 1 via ORAL
  Filled 2012-08-04 (×2): qty 1

## 2012-08-04 MED ORDER — OXYCODONE HCL ER 40 MG PO T12A
60.0000 mg | EXTENDED_RELEASE_TABLET | Freq: Two times a day (BID) | ORAL | Status: DC
  Administered 2012-08-04 – 2012-08-10 (×12): 60 mg via ORAL
  Filled 2012-08-04: qty 2
  Filled 2012-08-04 (×2): qty 1
  Filled 2012-08-04: qty 2
  Filled 2012-08-04 (×5): qty 1
  Filled 2012-08-04 (×2): qty 3
  Filled 2012-08-04 (×3): qty 1

## 2012-08-04 MED ORDER — HYDROMORPHONE HCL PF 1 MG/ML IJ SOLN
1.0000 mg | INTRAMUSCULAR | Status: AC | PRN
  Administered 2012-08-04 (×3): 1 mg via INTRAVENOUS
  Filled 2012-08-04 (×4): qty 1

## 2012-08-04 MED ORDER — GUAIFENESIN ER 600 MG PO TB12
600.0000 mg | ORAL_TABLET | Freq: Two times a day (BID) | ORAL | Status: DC
  Administered 2012-08-04 – 2012-08-09 (×11): 600 mg via ORAL
  Filled 2012-08-04 (×13): qty 1

## 2012-08-04 MED ORDER — FLEET ENEMA 7-19 GM/118ML RE ENEM
1.0000 | ENEMA | Freq: Every day | RECTAL | Status: DC | PRN
  Administered 2012-08-05: 1 via RECTAL
  Filled 2012-08-04: qty 1

## 2012-08-04 MED ORDER — HYDROMORPHONE HCL PF 1 MG/ML IJ SOLN
1.0000 mg | INTRAMUSCULAR | Status: DC | PRN
  Administered 2012-08-04 – 2012-08-05 (×6): 2 mg via INTRAVENOUS
  Administered 2012-08-05: 1 mg via INTRAVENOUS
  Administered 2012-08-05 – 2012-08-06 (×7): 2 mg via INTRAVENOUS
  Administered 2012-08-06: 1 mg via INTRAVENOUS
  Administered 2012-08-07 (×5): 2 mg via INTRAVENOUS
  Administered 2012-08-07: 1 mg via INTRAVENOUS
  Administered 2012-08-08 – 2012-08-10 (×8): 2 mg via INTRAVENOUS
  Administered 2012-08-10: 1 mg via INTRAVENOUS
  Administered 2012-08-10 (×2): 2 mg via INTRAVENOUS
  Filled 2012-08-04 (×3): qty 2
  Filled 2012-08-04: qty 1
  Filled 2012-08-04 (×6): qty 2
  Filled 2012-08-04: qty 1
  Filled 2012-08-04 (×6): qty 2
  Filled 2012-08-04: qty 1
  Filled 2012-08-04 (×15): qty 2
  Filled 2012-08-04: qty 1

## 2012-08-04 MED ORDER — HYDROMORPHONE HCL PF 2 MG/ML IJ SOLN
2.0000 mg | Freq: Once | INTRAMUSCULAR | Status: AC
  Administered 2012-08-04: 2 mg via INTRAVENOUS
  Filled 2012-08-04: qty 1

## 2012-08-04 MED ORDER — CYCLOBENZAPRINE HCL 10 MG PO TABS
10.0000 mg | ORAL_TABLET | Freq: Three times a day (TID) | ORAL | Status: DC | PRN
  Filled 2012-08-04: qty 1

## 2012-08-04 MED ORDER — IOHEXOL 300 MG/ML  SOLN
100.0000 mL | Freq: Once | INTRAMUSCULAR | Status: AC | PRN
  Administered 2012-08-04: 100 mL via INTRAVENOUS

## 2012-08-04 MED ORDER — POTASSIUM CHLORIDE 10 MEQ/100ML IV SOLN
10.0000 meq | INTRAVENOUS | Status: AC
  Administered 2012-08-04 – 2012-08-05 (×4): 10 meq via INTRAVENOUS
  Filled 2012-08-04: qty 400

## 2012-08-04 MED ORDER — ONDANSETRON HCL 4 MG/2ML IJ SOLN
4.0000 mg | Freq: Four times a day (QID) | INTRAMUSCULAR | Status: DC | PRN
  Administered 2012-08-04: 4 mg via INTRAVENOUS
  Filled 2012-08-04: qty 2

## 2012-08-04 MED ORDER — HYDROMORPHONE HCL PF 1 MG/ML IJ SOLN
1.0000 mg | INTRAMUSCULAR | Status: DC | PRN
  Administered 2012-08-04: 1 mg via INTRAVENOUS

## 2012-08-04 MED ORDER — ENOXAPARIN SODIUM 40 MG/0.4ML ~~LOC~~ SOLN
40.0000 mg | SUBCUTANEOUS | Status: DC
  Administered 2012-08-04 – 2012-08-09 (×6): 40 mg via SUBCUTANEOUS
  Filled 2012-08-04 (×8): qty 0.4

## 2012-08-04 MED ORDER — HYDROMORPHONE HCL PF 1 MG/ML IJ SOLN
1.0000 mg | INTRAMUSCULAR | Status: DC | PRN
  Administered 2012-08-04: 2 mg via INTRAVENOUS
  Filled 2012-08-04: qty 2

## 2012-08-04 MED ORDER — OXYCODONE-ACETAMINOPHEN 5-325 MG PO TABS
1.0000 | ORAL_TABLET | Freq: Four times a day (QID) | ORAL | Status: DC | PRN
  Administered 2012-08-04 – 2012-08-08 (×4): 1 via ORAL
  Filled 2012-08-04 (×4): qty 1

## 2012-08-04 MED ORDER — DEXTROSE 5 % IV SOLN
1.0000 g | Freq: Three times a day (TID) | INTRAVENOUS | Status: DC
  Administered 2012-08-04 – 2012-08-10 (×18): 1 g via INTRAVENOUS
  Filled 2012-08-04 (×20): qty 1

## 2012-08-04 MED ORDER — OXYCODONE HCL ER 60 MG PO T12A: 1.0000 | EXTENDED_RELEASE_TABLET | Freq: Two times a day (BID) | ORAL | Status: DC

## 2012-08-04 MED ORDER — VANCOMYCIN HCL IN DEXTROSE 1-5 GM/200ML-% IV SOLN
1000.0000 mg | Freq: Three times a day (TID) | INTRAVENOUS | Status: DC
  Administered 2012-08-04 – 2012-08-05 (×3): 1000 mg via INTRAVENOUS
  Filled 2012-08-04 (×5): qty 200

## 2012-08-04 MED ORDER — FERROUS SULFATE 325 (65 FE) MG PO TABS
325.0000 mg | ORAL_TABLET | Freq: Three times a day (TID) | ORAL | Status: DC
  Administered 2012-08-04 – 2012-08-10 (×17): 325 mg via ORAL
  Filled 2012-08-04 (×20): qty 1

## 2012-08-04 MED ORDER — HYDROMORPHONE HCL PF 2 MG/ML IJ SOLN: 2.0000 mg | INTRAMUSCULAR | Status: DC | PRN

## 2012-08-04 MED ORDER — HYDROMORPHONE HCL PF 1 MG/ML IJ SOLN
1.0000 mg | INTRAMUSCULAR | Status: DC | PRN
  Administered 2012-08-04: 1 mg via INTRAVENOUS
  Filled 2012-08-04: qty 1

## 2012-08-04 MED ORDER — SENNA-DOCUSATE SODIUM 8.6-50 MG PO TABS: 1.0000 | ORAL_TABLET | Freq: Every day | ORAL | Status: DC | PRN

## 2012-08-04 MED ORDER — ONDANSETRON HCL 4 MG/2ML IJ SOLN
4.0000 mg | Freq: Once | INTRAMUSCULAR | Status: AC
  Filled 2012-08-04: qty 2

## 2012-08-04 MED ORDER — ONDANSETRON HCL 4 MG/2ML IJ SOLN: 4.0000 mg | Freq: Three times a day (TID) | INTRAMUSCULAR | Status: DC | PRN

## 2012-08-04 MED ORDER — IOHEXOL 300 MG/ML  SOLN
50.0000 mL | Freq: Once | INTRAMUSCULAR | Status: AC | PRN
  Administered 2012-08-04: 50 mL via ORAL

## 2012-08-04 MED ORDER — SODIUM CHLORIDE 0.9 % IV SOLN
INTRAVENOUS | Status: DC
  Administered 2012-08-04: 10:00:00 via INTRAVENOUS

## 2012-08-04 NOTE — H&P (Signed)
[facility] History and Physical  [name] ZOX:[id number] DOB: 08-31-1965 DOA: 08/04/2012  Referring physician:Dr Lynelle Doctor PCP: No primary provider on file.  Specialists:  Chief Complaint: worsening pain esp in abd and back, as well as cough.  HPI: [name] is a 47 y.o. female with PMH as listed below including breast cancer with mets to liver and thoracic vertebrae who presents with the above complaints. She states that she has had a non productive cough for the past 1week but that it has worsened in the past 2days. Also she reports that she has pain all over which she has had for some time with her cancer, but thet the pain has been worsening and especially in the past 2days the abdominal and back pain have worsened. In the ED she received multiple doses of IV dilaudid, but her pain remained uncontrolled at level 10. She denies N/V and no diarrhea. She had a CT of chest  done in ED which revealed Enlarging moderate bilateral pleural effusions, with some pleural thickening and enhancement, suggesting malignant pleural metastase.Progression of metastatic disease to the bones, particularly throughout the thoracic spine, with a new nondisplaced pathologic fracture of the posterior aspect of the right eleventh rib.New multifocal peribronchovascular air space disease throughout the left upper and lower lobe concerning for multilobar bronchopneumonia. CT of abd showed Overall, the burden of metastatic disease in the bones and the liver similar to the prior, with suggestion of slight progression in the liver, Importantly, many of the liver lesions do show areas of internal decreased enhancement, which could suggest developing areas of necrosis; New small volume of ascites; Moderate volume of well formed stool throughout the colon suggesting constipation. UA neg for infection. She is admitted to hospitalist service for further eval and management.    Review of Systems: The patient denies  anorexia, fever, weight loss,, vision loss, decreased hearing, hoarseness, syncope, dyspnea on exertion, peripheral edema, balance deficits, hemoptysis, abdominal pain, melena, hematochezia, severe indigestion/heartburn, hematuria, incontinence, genital sores, muscle weakness, suspicious skin lesions, transient blindness, difficulty unusual weight change, abnormal bleeding.    Past Medical History  Diagnosis Date  . Hypertension   . GERD (gastroesophageal reflux disease)   . 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 Pyelonephritis 10/01/11  . Recurrent UTI (urinary tract infection) 10/01/11  . S/P chemotherapy, time since 4-12 weeks   . S/P radiation > 12 weeks 03/15/12- 04/07/12    right hip, clivus  . 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 Brain cancer     spread from breast ca  . Hypercalcemia 06/21/2012  . History of radiation therapy 08/06/10 -09/16/10    R supraclav fossa, R chest wall, R drain site   Past Surgical History  Procedure Laterality Date  . Cesarean section  1989; 2010  . Mastectomy  2011    right  . Back surgery  2012    "removed tumor,cancer, from my spine  . Port-a-cath removal  2012    right chest  . Portacath placement  2011    right  . Tubal ligation  2010  . Breast biopsy  2011    right  . Hip arthroplasty  05/04/2012    Procedure: ARTHROPLASTY BIPOLAR HIP;  Surgeon: Shelda Pal, MD;  Location: [facility];  Service: Orthopedics;  Laterality: Right;  RIGHT HIP HEMI ARTHROPLASTY    Social History:  reports that she quit smoking about 3 months ago. Her  smoking use included Cigarettes. She has a 7.75 pack-year smoking history. She has never used smokeless tobacco. She reports that she does not drink alcohol or use illicit drugs.  where does patient live--home    No Known Allergies  Family History  Problem Relation Age of Onset  . Cancer Brother 84    died of colon cancer at age of 39  . High blood pressure Mother      Prior to Admission medications   Medication Sig Start Date End Date Taking? Authorizing Provider  cyclobenzaprine (FLEXERIL) 10 MG tablet Take 10 mg by mouth 3 (three) times daily as needed for muscle spasms.   Yes Historical Provider, MD  dexamethasone (DECADRON) 4 MG tablet Take 2 tablets (8 mg total) by mouth every 12 (twelve) hours. 07/23/12  Yes Alison Murray, MD  docusate sodium (COLACE) 100 MG capsule Take 1 capsule (100 mg total) by mouth 2 (two) times daily as needed for constipation. 07/10/12  Yes Penny Pia, MD  exemestane (AROMASIN) 25 MG tablet Take 25 mg by mouth daily after breakfast. 05/03/12  Yes Lowella Dell, MD  ondansetron (ZOFRAN) 4 MG tablet Take 4 mg by mouth every 6 (six) hours. 06/15/12  Yes Roxy Horseman, PA-C  OxyCODONE HCl ER (OXYCONTIN) 60 MG T12A Take 1 tablet by mouth 2 (two) times daily. 07/29/12  Yes Lowella Dell, MD  zolpidem (AMBIEN) 5 MG tablet Take 1 tablet (5 mg total) by mouth at bedtime as needed for sleep. 07/23/12  Yes Alison Murray, MD  everolimus (AFINITOR) 5 MG tablet Take 5 mg by mouth daily. 06/09/12   Amy Allegra Grana, PA-C  ferrous sulfate 325 (65 FE) MG tablet Take 325 mg by mouth 3 (three) times daily after meals. 05/06/12   Genelle Gather Babish, PA-C  oxyCODONE-acetaminophen (PERCOCET) 10-325 MG per tablet Take 1 tablet by mouth every 4 (four) hours as needed for pain. 07/29/12   Lowella Dell, MD  sennosides-docusate sodium (SENOKOT-S) 8.6-50 MG tablet Take 1 tablet by mouth daily as needed for constipation. 07/10/12   Penny Pia, MD   Physical Exam: Filed Vitals:   08/04/12 0830 08/04/12 1119 08/04/12 1317 08/04/12 1455  BP: 171/82 177/89 161/94 153/75  Pulse: 129 125 122 123  Temp: 98 F (36.7 C) 99.3 F (37.4 C) 99.5 F (37.5 C) 99 F (37.2 C)  TempSrc: Oral Oral Oral Oral  Resp:  32 26   SpO2: 91% 95% 97% 95%    Constitutional: Vital signs reviewed.  Patient is a well-developed middle aged female in moderate distress  secondary to pain, crying intermittently, but also sleeps off intermittently during the interview, easily aroused and oriented x3.  Head: Normocephalic and atraumatic Mouth: no erythema or exudates, MMM Eyes: PERRL, EOMI, conjunctivae normal, No scleral icterus.  Neck: Supple, Trachea midline normal ROM, No JVD, mass, thyromegaly, or carotid bruit present.  Cardiovascular: tachy, regular, S1 normal, S2 normal, no MRG, pulses symmetric and intact bilaterally Pulmonary/Chest: Decrease BS in lower lobes bil, no wheezes, rales, or rhonchi Abdominal: Soft. Mildly distended, marked RUQ tenderness with mild tenderness in rest of abd. bowel sounds are normal, no masses, palpable, or guarding present. No rebound Extremities: No cyanosis and no edema  Neurological: Strength is normal and symmetric bilaterally, cranial nerve II-XII are grossly intact, no focal motor deficit, sensory intact to light touch bilaterally.  Skin: Warm, dry and intact. No rash.   Labs on Admission:  Basic Metabolic Panel:  Recent Labs Lab 08/03/12 1609  08/04/12 0915  NA 142 139  K 3.2* 3.2*  CL 103 100  CO2 27 27  GLUCOSE 209* 110*  BUN 9.3 7  CREATININE 0.9 0.69  CALCIUM 9.4 8.8   Liver Function Tests:  Recent Labs Lab 08/03/12 1609 08/04/12 0915  AST 39* 38*  ALT 19 17  ALKPHOS 293* 256*  BILITOT 0.45 0.5  PROT 6.9 6.5  ALBUMIN 3.1* 3.1*   No results found for this basename: LIPASE, AMYLASE,  in the last 168 hours No results found for this basename: AMMONIA,  in the last 168 hours CBC:  Recent Labs Lab 08/03/12 1609 08/04/12 0915  WBC 12.2* 7.7  NEUTROABS 11.3* 7.2  HGB 9.2* 8.9*  HCT 27.6* 26.2*  MCV 78.7* 78.9  PLT 362 319   Cardiac Enzymes: No results found for this basename: CKTOTAL, CKMB, CKMBINDEX, TROPONINI,  in the last 168 hours  BNP (last 3 results) No results found for this basename: PROBNP,  in the last 8760 hours CBG: No results found for this basename: GLUCAP,  in the last  168 hours  Radiological Exams on Admission: Ct Chest W Contrast  08/04/2012  *RADIOLOGY REPORT*  Clinical Data:  Abdominal pain.  Metastatic breast cancer.  CT CHEST, ABDOMEN AND PELVIS WITH CONTRAST  Technique:  Multidetector CT imaging of the chest, abdomen and pelvis was performed following the standard protocol during bolus administration of intravenous contrast.  Contrast: OMNIPAQUE IOHEXOL 300 MG/ML  SOLN,  Comparison:  Chest CT 04/20/2012.  CT of the abdomen and pelvis 07/07/2012.  CT CHEST  Findings:  Mediastinum: Heart size is normal. There is no significant pericardial fluid, thickening or pericardial calcification.  There is some prominent but not enlarged nodal tissue in the low right paratracheal station which appears to represent a cluster of small lymph nodes.  Several other small borderline enlarged lymph nodes are noted in the mediastinum, most notably in the subcarinal station.  No definite pathologic nodal enlargement is noted. Esophagus is unremarkable in appearance.  Lungs/Pleura: Evaluation of the lung parenchyma is limited by extensive patient respiratory motion.  With this limitation in mind there is new peribronchovascular ground-glass attenuation air space disease in the left upper lobe, and to a lesser extent in the inferior segments of the left lower lobe, concerning for multilobar pneumonia.  No definite suspicious appearing pulmonary nodules or masses are confidently identified.  Moderate bilateral pleural effusions layer dependently and are relatively simple in appearance, however, there are some areas of pleural thickening and enhancement, best demonstrated on image 43 of series 2 in the posterior aspect of the right pleural cavity, suggesting that there may be malignant pleural involvement.  Musculoskeletal: Postoperative changes of modified radical right sided mastectomy with right axillary clips compatible with prior nodal dissection.  Innumerable mixed lytic and sclerotic  lesions are again noted throughout the visualized axial and appendicular skeleton, most pronounced throughout the thoracic spine.  There is a new nondisplaced pathologic fracture through the posterior aspect of the right eleventh rib.  Postoperative changes of laminectomy are noted at its T9-T10. Enhancing soft tissue in the epidural space in the left side of the upper thoracic spine in the right side of the lower thoracic spine, concerning for additional sites of metastatic disease.  No definite pathologic vertebral body compression fractures are identified at this time.  IMPRESSION:  1. Enlarging moderate bilateral pleural effusions, with some pleural thickening and enhancement, suggesting malignant pleural metastases.  Sampling of pleural fluid may be warranted if clinically  indicated. 2. Progression of metastatic disease to the bones, particularly throughout the thoracic spine, with a new nondisplaced pathologic fracture of the posterior aspect of the right eleventh rib. Enhancing soft tissue in the epidural space adjacent to both the upper and lower thoracic spine is also noted, better demonstrated on recent MRI. 3.  New multifocal peribronchovascular air space disease throughout the left upper and lower lobe concerning for multilobar bronchopneumonia.  CT ABDOMEN AND PELVIS  Findings:  Abdomen/Pelvis: There are again innumerable hepatic nodules and masses, compatible with widespread metastatic disease, largest of which measures up to 10.8 x 9.8 cm in the right lobe of the liver (image 48 of series 2).  Overall, the size of these lesions appears very similar to the prior examination, if not slightly increased, but there is increasing central low attenuation within many of these lesions, suggesting internal areas of necrosis and vascular compromise.  Some lesions do appear slightly larger, including a small lesion in the right lobe of the liver on image 66 of series 2 which currently measures 13 mm (previously  only 9 mm).  The appearance of the gallbladder, pancreas, spleen, bilateral adrenal glands in the right kidney is unremarkable.  Parenchymal thinning in the interpolar region of the left kidney, and a tiny 2 mm nonobstructive calculus in the interpolar left kidney are both unchanged.  A tiny volume of ascites in the anatomic pelvis is simple in appearance.  Uterus is heterogeneous in appearance with multifocal low attenuation and partially calcified lesions, likely represent fibroids.  Ovaries are unremarkable in appearance. Assessment of the anatomic pelvis is significantly limited by extensive beam hardening artifact from the patient's right sided hip prosthesis.  The urinary bladder is moderately distended without focal abnormalities.  No pneumoperitoneum.  No pathologic distension of small bowel.  No definite pathologic lymphadenopathy identified within the abdomen or pelvis.  Relatively large volume of well formed stool throughout the colon may suggest constipation.  Musculoskeletal: Status post right total hip arthroplasty. Numerous mixed lytic and sclerotic lesions are again noted throughout the visualized axial and appendicular skeleton, similar in appearance to prior examinations.  IMPRESSION:  1.  Overall, the burden of metastatic disease in the bones and the liver is very similar to the prior examination, with suggestion of slight progression in the liver, as above.  Importantly, many of the liver lesions do show areas of internal decreased enhancement, which could suggest developing areas of necrosis. 2.  New small volume of ascites. 3.  Moderate volume of well formed stool throughout the colon may suggest constipation. 4.  Additional incidental findings, as above.   Original Report Authenticated By: Trudie Reed, M.D.    Ct Chest W Contrast  08/04/2012  *RADIOLOGY REPORT*  Clinical Data:  Abdominal pain.  Metastatic breast cancer.  CT CHEST, ABDOMEN AND PELVIS WITH CONTRAST  Technique:   Multidetector CT imaging of the chest, abdomen and pelvis was performed following the standard protocol during bolus administration of intravenous contrast.  Contrast: OMNIPAQUE IOHEXOL 300 MG/ML  SOLN,  Comparison:  Chest CT 04/20/2012.  CT of the abdomen and pelvis 07/07/2012.  CT CHEST  Findings:  Mediastinum: Heart size is normal. There is no significant pericardial fluid, thickening or pericardial calcification.  There is some prominent but not enlarged nodal tissue in the low right paratracheal station which appears to represent a cluster of small lymph nodes.  Several other small borderline enlarged lymph nodes are noted in the mediastinum, most notably in the subcarinal station.  No definite pathologic nodal enlargement is noted. Esophagus is unremarkable in appearance.  Lungs/Pleura: Evaluation of the lung parenchyma is limited by extensive patient respiratory motion.  With this limitation in mind there is new peribronchovascular ground-glass attenuation air space disease in the left upper lobe, and to a lesser extent in the inferior segments of the left lower lobe, concerning for multilobar pneumonia.  No definite suspicious appearing pulmonary nodules or masses are confidently identified.  Moderate bilateral pleural effusions layer dependently and are relatively simple in appearance, however, there are some areas of pleural thickening and enhancement, best demonstrated on image 43 of series 2 in the posterior aspect of the right pleural cavity, suggesting that there may be malignant pleural involvement.  Musculoskeletal: Postoperative changes of modified radical right sided mastectomy with right axillary clips compatible with prior nodal dissection.  Innumerable mixed lytic and sclerotic lesions are again noted throughout the visualized axial and appendicular skeleton, most pronounced throughout the thoracic spine.  There is a new nondisplaced pathologic fracture through the posterior aspect of the  right eleventh rib.  Postoperative changes of laminectomy are noted at its T9-T10. Enhancing soft tissue in the epidural space in the left side of the upper thoracic spine in the right side of the lower thoracic spine, concerning for additional sites of metastatic disease.  No definite pathologic vertebral body compression fractures are identified at this time.  IMPRESSION:  1. Enlarging moderate bilateral pleural effusions, with some pleural thickening and enhancement, suggesting malignant pleural metastases.  Sampling of pleural fluid may be warranted if clinically indicated. 2. Progression of metastatic disease to the bones, particularly throughout the thoracic spine, with a new nondisplaced pathologic fracture of the posterior aspect of the right eleventh rib. Enhancing soft tissue in the epidural space adjacent to both the upper and lower thoracic spine is also noted, better demonstrated on recent MRI. 3.  New multifocal peribronchovascular air space disease throughout the left upper and lower lobe concerning for multilobar bronchopneumonia.  CT ABDOMEN AND PELVIS  Findings:  Abdomen/Pelvis: There are again innumerable hepatic nodules and masses, compatible with widespread metastatic disease, largest of which measures up to 10.8 x 9.8 cm in the right lobe of the liver (image 48 of series 2).  Overall, the size of these lesions appears very similar to the prior examination, if not slightly increased, but there is increasing central low attenuation within many of these lesions, suggesting internal areas of necrosis and vascular compromise.  Some lesions do appear slightly larger, including a small lesion in the right lobe of the liver on image 66 of series 2 which currently measures 13 mm (previously only 9 mm).  The appearance of the gallbladder, pancreas, spleen, bilateral adrenal glands in the right kidney is unremarkable.  Parenchymal thinning in the interpolar region of the left kidney, and a tiny 2 mm  nonobstructive calculus in the interpolar left kidney are both unchanged.  A tiny volume of ascites in the anatomic pelvis is simple in appearance.  Uterus is heterogeneous in appearance with multifocal low attenuation and partially calcified lesions, likely represent fibroids.  Ovaries are unremarkable in appearance. Assessment of the anatomic pelvis is significantly limited by extensive beam hardening artifact from the patient's right sided hip prosthesis.  The urinary bladder is moderately distended without focal abnormalities.  No pneumoperitoneum.  No pathologic distension of small bowel.  No definite pathologic lymphadenopathy identified within the abdomen or pelvis.  Relatively large volume of well formed stool throughout the colon may suggest  constipation.  Musculoskeletal: Status post right total hip arthroplasty. Numerous mixed lytic and sclerotic lesions are again noted throughout the visualized axial and appendicular skeleton, similar in appearance to prior examinations.  IMPRESSION:  1.  Overall, the burden of metastatic disease in the bones and the liver is very similar to the prior examination, with suggestion of slight progression in the liver, as above.  Importantly, many of the liver lesions do show areas of internal decreased enhancement, which could suggest developing areas of necrosis. 2.  New small volume of ascites. 3.  Moderate volume of well formed stool throughout the colon may suggest constipation. 4.  Additional incidental findings, as above.   Original Report Authenticated By: Trudie Reed, M.D.    Ct Abdomen Pelvis W Contrast  08/04/2012  *RADIOLOGY REPORT*  Clinical Data:  Abdominal pain.  Metastatic breast cancer.  CT CHEST, ABDOMEN AND PELVIS WITH CONTRAST  Technique:  Multidetector CT imaging of the chest, abdomen and pelvis was performed following the standard protocol during bolus administration of intravenous contrast.  Contrast: OMNIPAQUE IOHEXOL 300 MG/ML  SOLN,   Comparison:  Chest CT 04/20/2012.  CT of the abdomen and pelvis 07/07/2012.  CT CHEST  Findings:  Mediastinum: Heart size is normal. There is no significant pericardial fluid, thickening or pericardial calcification.  There is some prominent but not enlarged nodal tissue in the low right paratracheal station which appears to represent a cluster of small lymph nodes.  Several other small borderline enlarged lymph nodes are noted in the mediastinum, most notably in the subcarinal station.  No definite pathologic nodal enlargement is noted. Esophagus is unremarkable in appearance.  Lungs/Pleura: Evaluation of the lung parenchyma is limited by extensive patient respiratory motion.  With this limitation in mind there is new peribronchovascular ground-glass attenuation air space disease in the left upper lobe, and to a lesser extent in the inferior segments of the left lower lobe, concerning for multilobar pneumonia.  No definite suspicious appearing pulmonary nodules or masses are confidently identified.  Moderate bilateral pleural effusions layer dependently and are relatively simple in appearance, however, there are some areas of pleural thickening and enhancement, best demonstrated on image 43 of series 2 in the posterior aspect of the right pleural cavity, suggesting that there may be malignant pleural involvement.  Musculoskeletal: Postoperative changes of modified radical right sided mastectomy with right axillary clips compatible with prior nodal dissection.  Innumerable mixed lytic and sclerotic lesions are again noted throughout the visualized axial and appendicular skeleton, most pronounced throughout the thoracic spine.  There is a new nondisplaced pathologic fracture through the posterior aspect of the right eleventh rib.  Postoperative changes of laminectomy are noted at its T9-T10. Enhancing soft tissue in the epidural space in the left side of the upper thoracic spine in the right side of the lower  thoracic spine, concerning for additional sites of metastatic disease.  No definite pathologic vertebral body compression fractures are identified at this time.  IMPRESSION:  1. Enlarging moderate bilateral pleural effusions, with some pleural thickening and enhancement, suggesting malignant pleural metastases.  Sampling of pleural fluid may be warranted if clinically indicated. 2. Progression of metastatic disease to the bones, particularly throughout the thoracic spine, with a new nondisplaced pathologic fracture of the posterior aspect of the right eleventh rib. Enhancing soft tissue in the epidural space adjacent to both the upper and lower thoracic spine is also noted, better demonstrated on recent MRI. 3.  New multifocal peribronchovascular air space disease throughout the  left upper and lower lobe concerning for multilobar bronchopneumonia.  CT ABDOMEN AND PELVIS  Findings:  Abdomen/Pelvis: There are again innumerable hepatic nodules and masses, compatible with widespread metastatic disease, largest of which measures up to 10.8 x 9.8 cm in the right lobe of the liver (image 48 of series 2).  Overall, the size of these lesions appears very similar to the prior examination, if not slightly increased, but there is increasing central low attenuation within many of these lesions, suggesting internal areas of necrosis and vascular compromise.  Some lesions do appear slightly larger, including a small lesion in the right lobe of the liver on image 66 of series 2 which currently measures 13 mm (previously only 9 mm).  The appearance of the gallbladder, pancreas, spleen, bilateral adrenal glands in the right kidney is unremarkable.  Parenchymal thinning in the interpolar region of the left kidney, and a tiny 2 mm nonobstructive calculus in the interpolar left kidney are both unchanged.  A tiny volume of ascites in the anatomic pelvis is simple in appearance.  Uterus is heterogeneous in appearance with multifocal low  attenuation and partially calcified lesions, likely represent fibroids.  Ovaries are unremarkable in appearance. Assessment of the anatomic pelvis is significantly limited by extensive beam hardening artifact from the patient's right sided hip prosthesis.  The urinary bladder is moderately distended without focal abnormalities.  No pneumoperitoneum.  No pathologic distension of small bowel.  No definite pathologic lymphadenopathy identified within the abdomen or pelvis.  Relatively large volume of well formed stool throughout the colon may suggest constipation.  Musculoskeletal: Status post right total hip arthroplasty. Numerous mixed lytic and sclerotic lesions are again noted throughout the visualized axial and appendicular skeleton, similar in appearance to prior examinations.  IMPRESSION:  1.  Overall, the burden of metastatic disease in the bones and the liver is very similar to the prior examination, with suggestion of slight progression in the liver, as above.  Importantly, many of the liver lesions do show areas of internal decreased enhancement, which could suggest developing areas of necrosis. 2.  New small volume of ascites. 3.  Moderate volume of well formed stool throughout the colon may suggest constipation. 4.  Additional incidental findings, as above.   Original Report Authenticated By: Trudie Reed, M.D.     EKG: Independently reviewed. Sinus tach at 125, no acute ST changes  Assessment/Plan Principal Problem: Abdominal pain &  Back pain and New 11th rib pathologic fracture -A discussed above possibly secondary to metastatic disease- slight progression of liver disease with areas suggesting developing areas of necrosis noted and new 11th rib pathologic fracture noted as above -UA is neg for UTI, will also obtain lipase, CEs -pain management - IV dilaudid prn, and continue outpt scheduled oxycontin and prn percocet Active Problems: HCAP (healthcare-associated pneumonia) -obtain  cultures -empiric abx for HCAP with Vanc, Levaquin,and cefepime 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 -followed by Dr Darnelle Catalan, call for consult but he is out today, will call back in am- I have added to his epic list  Bil Pleural effusions  - Possibly secondary to infection vs malignancy -abx as above - if develops fevers will likely need diagnostic thoracentesis   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

## 2012-08-04 NOTE — ED Notes (Addendum)
Per PAs request: Removed 3.5L [place] to test RA Oxygen saturation.  O2 sat dropped to low 80s- 3L reapplied.  Now satting at 97%.

## 2012-08-04 NOTE — ED Notes (Signed)
Pt to CT

## 2012-08-04 NOTE — ED Notes (Signed)
[id number]<AV> Expected date:<BR> Expected time:<BR> Means of arrival:<BR> Comments:<BR> vomiting

## 2012-08-04 NOTE — ED Notes (Signed)
MD at bedside. 

## 2012-08-04 NOTE — ED Provider Notes (Signed)
History     CSN: [id number]  Arrival date & time 08/04/12  0828   First MD Initiated Contact with Patient 08/04/12 [phone]      Chief Complaint  Patient presents with  . Abdominal Pain    (Consider location/radiation/quality/duration/timing/severity/associated sxs/prior treatment) HPI  [name] is a 47 y.o. female with past medical history significant for breast cancer with metastases to liver, spine and brain complaining of severe lower abdominal and low back pain. States that this current episode started last night and is associated with nausea and vomiting, no fever change in bowel or bladder habits. As per chart review, patient is receiving palliative radiation for cord compression.   Past Medical History  Diagnosis Date  . Hypertension   . GERD (gastroesophageal reflux disease)   . 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 Pyelonephritis 10/01/11  . Recurrent UTI (urinary tract infection) 10/01/11  . S/P chemotherapy, time since 4-12 weeks   . S/P radiation > 12 weeks 03/15/12- 04/07/12    right hip, clivus  . 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 Brain cancer     spread from breast ca  . Hypercalcemia 06/21/2012  . History of radiation therapy 08/06/10 -09/16/10    R supraclav fossa, R chest wall, R drain site    Past Surgical History  Procedure Laterality Date  . Cesarean section  1989; 2010  . Mastectomy  2011    right  . Back surgery  2012    "removed tumor,cancer, from my spine  . Port-a-cath removal  2012    right chest  . Portacath placement  2011    right  . Tubal ligation  2010  . Breast biopsy  2011    right  . Hip arthroplasty  05/04/2012    Procedure: ARTHROPLASTY BIPOLAR HIP;  Surgeon: Shelda Pal, MD;  Location: [facility];  Service: Orthopedics;  Laterality: Right;  RIGHT HIP HEMI ARTHROPLASTY     Family History  Problem Relation Age of Onset  . Cancer Brother 26    died of colon cancer at age of 44  .  High blood pressure Mother     History  Substance Use Topics  . Smoking status: Former Smoker -- 0.25 packs/day for 31 years    Types: Cigarettes    Quit date: 04/26/2012  . Smokeless tobacco: Never Used  . Alcohol Use: No    OB History   Grav Para Term Preterm Abortions TAB SAB Ect Mult Living                  Review of Systems  Constitutional: Negative for fever.  Respiratory: Negative for shortness of breath.   Cardiovascular: Negative for chest pain.  Gastrointestinal: Positive for nausea, vomiting and abdominal pain. Negative for diarrhea.  Musculoskeletal: Positive for back pain.  All other systems reviewed and are negative.    Allergies  Review of patient's allergies indicates no known allergies.  Home Medications   Current Outpatient Rx  Name  Route  Sig  Dispense  Refill  . cyclobenzaprine (FLEXERIL) 10 MG tablet   Oral   Take 10 mg by mouth 3 (three) times daily as needed for muscle spasms.         [name] dexamethasone (DECADRON) 4 MG tablet   Oral   Take 2 tablets (8 mg total) by mouth every 12 (twelve) hours.   60 tablet   0   .  docusate sodium (COLACE) 100 MG capsule   Oral   Take 1 capsule (100 mg total) by mouth 2 (two) times daily as needed for constipation.   10 capsule   0   . everolimus (AFINITOR) 5 MG tablet   Oral   Take 5 mg by mouth daily.         [name] exemestane (AROMASIN) 25 MG tablet   Oral   Take 25 mg by mouth daily after breakfast.         . ferrous sulfate 325 (65 FE) MG tablet   Oral   Take 325 mg by mouth 3 (three) times daily after meals.         [name] HYDROmorphone (DILAUDID) 4 MG tablet   Oral   Take 1 tablet (4 mg total) by mouth every 4 (four) hours as needed for pain.   60 tablet   0   . ondansetron (ZOFRAN) 4 MG tablet   Oral   Take 4 mg by mouth every 6 (six) hours.         . OxyCODONE HCl ER (OXYCONTIN) 60 MG T12A   Oral   Take 1 tablet by mouth 2 (two) times daily.   60 each   0   .  oxyCODONE-acetaminophen (PERCOCET) 10-325 MG per tablet   Oral   Take 1 tablet by mouth every 4 (four) hours as needed for pain.   120 tablet   0   . sennosides-docusate sodium (SENOKOT-S) 8.6-50 MG tablet   Oral   Take 1 tablet by mouth daily as needed for constipation.   20 tablet   0   . sucralfate (CARAFATE) 1 GM/10ML suspension   Oral   Take 10 mLs (1 g total) by mouth 4 (four) times daily.   420 mL   14   . zolpidem (AMBIEN) 5 MG tablet   Oral   Take 1 tablet (5 mg total) by mouth at bedtime as needed for sleep.   30 tablet   1     BP 171/82  Pulse 129  Temp(Src) 98 F (36.7 C) (Oral)  SpO2 91%  Physical Exam  Nursing note and vitals reviewed. Constitutional: She is oriented to person, place, and time. She appears well-developed and well-nourished. No distress.  Acutely agitated   HENT:  Head: Normocephalic.  Mouth/Throat: Oropharynx is clear and moist.  Eyes: Conjunctivae and EOM are normal. Pupils are equal, round, and reactive to light.  Cardiovascular: Regular rhythm and intact distal pulses.   Tachy  Pulmonary/Chest: Effort normal and breath sounds normal. No stridor. No respiratory distress. She has no wheezes. She has no rales. She exhibits no tenderness.  Abdominal: Soft. She exhibits distension. There is tenderness.  Musculoskeletal: Normal range of motion.  Neurological: She is alert and oriented to person, place, and time.  Psychiatric: She has a normal mood and affect.    ED Course  Procedures (including critical care time)  Labs Reviewed  CBC WITH DIFFERENTIAL - Abnormal; Notable for the following:    RBC 3.32 (*)    Hemoglobin 8.9 (*)    HCT 26.2 (*)    RDW 18.8 (*)    Neutrophils Relative 94 (*)    Lymphocytes Relative 4 (*)    Lymphs Abs 0.3 (*)    Monocytes Relative 2 (*)    All other components within normal limits  URINALYSIS, ROUTINE W REFLEX MICROSCOPIC  COMPREHENSIVE METABOLIC PANEL   No results found.   Date:  08/04/2012  Rate: 123  Rhythm: normal sinus rhythm  QRS Axis: normal  Intervals: normal  ST/T Wave abnormalities: normal  Conduction Disutrbances:none  Narrative Interpretation:   Old EKG Reviewed: none available    1. 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, unspecified laterality   2. Bone metastases   3. HCAP (healthcare-associated pneumonia)   4. Abdominal pain       MDM   Abdominal exam is nonsurgical however patient is in extreme pain. Believe her tachycardia is secondary to the pain. Abdominal CT, blood work, UA and chest CT ordered because patient was scheduled for an outpatient chest CT which she never showed up for her.   Filed Vitals:   08/04/12 0830 08/04/12 1119 08/04/12 1317 08/04/12 1455  BP: 171/82 177/89 161/94 153/75  Pulse: 129 125 122 123  Temp: 98 F (36.7 C) 99.3 F (37.4 C) 99.5 F (37.5 C) 99 F (37.2 C)  TempSrc: Oral Oral Oral Oral  Resp:  32 26   SpO2: 91% 95% 97% 95%     Chest CT shows enlarging bilateral pleural effusions and new multifocal disease consistent with pneumonia, additionally there is a new pathologic fracture of the posterior aspect of the right 11th rib. Radiologist recommends consideration of sampling pleural fluid is clinically in the indicated.   Abdominal CT shows a new small volume ascites previously noted metastatic disease with no interval change from prior CT.  The patient will be started on hospital-acquired pneumonia protocol. Blood cultures drawn. The Vancomycin and cefepime started.   Pt will be admitted to Dr. Demetrios Isaacs Pisciotta, PA-C 08/04/12 1545

## 2012-08-04 NOTE — ED Notes (Signed)
Patient made aware urine specimen is needed. States that she is unable to void at this time due to pain. RN notified

## 2012-08-04 NOTE — Progress Notes (Signed)
Home health choices offered  HOME HEALTH AGENCIES SERVING [place]   Agencies that are Medicare-Certified and are affiliated with The [facility] Agency  Telephone Number Address  [facility]   The [facility] has ownership interest in this company; however, you are under no obligation to use this agency. [phone] or  [phone] [address] [place], [place] [address] [email or web address]   Agencies that are Medicare-Certified and are not affiliated with [facility] Agency Telephone Number Address  [facility] [phone] Fax [phone] [address] [place], [place]  [address] [email or web address]  [facility] [phone] or [phone] Fax [phone] [address] [place], [place] [address] [email or web address]  [facility] [phone] Fax [phone] [address] [place], [place] [address] [email or web address]  [facility] [phone] Fax [phone] [address] [place], [place]  [address] [email or web address]  [facility] [phone] Fax [phone] [address], Suite [place], [place] [address] [email or web address]  [facility] [phone] [address] [place], [place] [address] [email or web address]  [facility] [phone]  [address] [place], [place] [address] [email or web address]  [facility] [phone] or [phone] Fax number [phone] [address] [place], [place]  [address] [email or web address]  [facility] [phone] Fax [phone] [address] [place], [place]  [address]  [facility]  [phone] Fax [phone] [address] [place], [place] [address] [email or web address]

## 2012-08-04 NOTE — ED Notes (Signed)
Per EMS: Pt stated that she started having diffuse abdominal pain yesterday but today it became constant and severe, especially with movement.  Hx of right mastectomy and "back cancer".  Per EMS, pt has been hysterical and hard to understand.

## 2012-08-04 NOTE — Progress Notes (Signed)
ANTIBIOTIC CONSULT NOTE - INITIAL  Pharmacy Consult for Vanc/Cefepime Indication: pneumonia  No Known Allergies  Patient Measurements:   60.4kg  Vital Signs: Temp: 99.5 F (37.5 C) (03/06 1317) Temp src: Oral (03/06 1317) BP: 161/94 mmHg (03/06 1317) Pulse Rate: 122 (03/06 1317) Intake/Output from previous day:   Intake/Output from this shift: Total I/O In: 125 [I.V.:125] Out: 1 [Urine:1]  Labs:  Recent Labs  08/03/12 1609 08/04/12 0915  WBC 12.2* 7.7  HGB 9.2* 8.9*  PLT 362 319  CREATININE 0.9 0.69   The CrCl is unknown because both a height and weight (above a minimum accepted value) are required for this calculation. CrCl >100 No results found for this basename: VANCOTROUGH, VANCOPEAK, VANCORANDOM, GENTTROUGH, GENTPEAK, GENTRANDOM, TOBRATROUGH, TOBRAPEAK, TOBRARND, AMIKACINPEAK, AMIKACINTROU, AMIKACIN,  in the last 72 hours   Microbiology: No results found for this or any previous visit (from the past 720 hour(s)).  Medical History: Past Medical History  Diagnosis Date  . Hypertension   . GERD (gastroesophageal reflux disease)   . 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 Pyelonephritis 10/01/11  . Recurrent UTI (urinary tract infection) 10/01/11  . S/P chemotherapy, time since 4-12 weeks   . S/P radiation > 12 weeks 03/15/12- 04/07/12    right hip, clivus  . 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 Brain cancer     spread from breast ca  . Hypercalcemia 06/21/2012  . History of radiation therapy 08/06/10 -09/16/10    R supraclav fossa, R chest wall, R drain site    Medications:  Anti-infectives   Start     Dose/Rate Route Frequency Ordered Stop   08/04/12 1400  ceFEPIme (MAXIPIME) 1 g in dextrose 5 % 50 mL IVPB     1 g 100 mL/hr over 30 Minutes Intravenous 3 times per day 08/04/12 1312 08/12/12 1359   08/04/12 1330  vancomycin (VANCOCIN) IVPB 1000 mg/200 mL premix     1,000 mg 200 mL/hr over 60 Minutes Intravenous   Once 08/04/12 1325     08/04/12 1315  levofloxacin (LEVAQUIN) IVPB 750 mg     750 mg 100 mL/hr over 90 Minutes Intravenous Every 24 hours 08/04/12 1312 08/07/12 1359     Assessment: 46 YOF w/ hx metastatic breast carcinoma being tx w/ Zoladex, exemastane and everolimus and radiation PTA. admitted 3/6 w/ abdominal pain and vomiting. Pt seen in [facility] yesterday w/ c/o of SOB and cough. Low O2 sats today in ER. CT concerning for bronchopneumonia. Levaquin 750mg  IV q24h and Cefepime 1g IV q8h ordered. Pharmacy may adjust doses based on renal fx. Vanc per pharmacy also ordered  CrCl >100  60.4kg  Afebrile, WBC wnl  Blood cx ordered  Goal of Therapy:  Vancomycin trough level 15-20 mcg/ml Appropriate dose of Levaquin and Cefepime Eradication of infx  Plan:   Vancomycin 1g IV q8h  Continue current doses of Cefepime and Levaquin  VT @ [facility]  Follow labs, vitals, cx  Adjust doses as appropriate  [name] PharmD  [phone] 08/04/2012 1:34 PM

## 2012-08-04 NOTE — Progress Notes (Signed)
ED CM spoke with pt about pcp and home health services after noting CM consult Pt confirms she has not been followed by home health.  CM checked with [facility] nurse after noting a referral for hospice in February 2014 but found out pt not actively followed by hospice.  Assisted pt to bathroom when she preferred not to use bedpan Pt with c/o pain upon waking Requests not to be awaken to speak with family if they call.  CM discussed that home health may be recommended for d./c Pt reports living alone Cm left list of [place] home health agencies near pt belongings in her room Further CM interventions pending pt improved pain status

## 2012-08-04 NOTE — ED Notes (Addendum)
Pt satting in mid 80's- applied 2L [place]- satting at 93%.  Increased to 3L [place]. 96% now.  Pt began complaining of chest pain.  Will place pt on cardiac monitor and EKG.    Pt refusing to drink much contrast.

## 2012-08-04 NOTE — Telephone Encounter (Addendum)
Received call from Zollie Scale, PA, med onc who states pt is in ED. Pt had CT chest done which showed bilat pleural effusions. She states med onc may schedule pt for thoracentesis. She wanted Dr Michell Heinrich to be aware. Phoned Dr Michell Heinrich and informed her of pt's status. Called [facility] and informed therapist pt is currently in ED.

## 2012-08-04 NOTE — ED Notes (Signed)
184/108- EMS BP en route.

## 2012-08-04 NOTE — ED Provider Notes (Signed)
Patient has breast cancer with metastatic disease to the liver, spine and brain. She presented today for abdominal pain. She's noted to have a distended abdomen. At time of my exam her heart rate was 120 and her abdomen appears to be very distended.  Medical screening examination/treatment/procedure(s) were conducted as a shared visit with non-physician practitioner(s) and myself.  I personally evaluated the patient during the encounter  Devoria Albe, MD, Franz Dell, MD 08/04/12 [phone]

## 2012-08-04 NOTE — ED Notes (Signed)
Pt sleeping well after third dose of Dilaudid 1mg  IV today since 0955.  HR has been in 120s-130s while awake and in 120s while sleeping.  Pt now on 3.5L O2 due to desatting below 94%.  Helped pt up in the bed to aide breathing.  After both the 2nd and 3rd dose of Dilaudid IV 1mg , pt had a period of sleep followed by waking with pain followed by a brief time of sleep.

## 2012-08-04 NOTE — Progress Notes (Signed)
[facility] ED CM with pt at bedside providing comfort measures until Dr Sanda Klein arrived to evaluate

## 2012-08-04 NOTE — Telephone Encounter (Signed)
lmonvm advising the pt of her injection appt on 08/05/2012 to follow the rad appt.

## 2012-08-05 ENCOUNTER — Ambulatory Visit
Admission: RE | Admit: 2012-08-05 | Discharge: 2012-08-05 | Disposition: A | Discharge status: Home / Self Care | Payer: Medicaid Other | Source: Ambulatory Visit | Attending: Radiation Oncology | Admitting: Radiation Oncology

## 2012-08-05 ENCOUNTER — Ambulatory Visit: Payer: Medicaid Other

## 2012-08-05 ENCOUNTER — Ambulatory Visit: Admit: 2012-08-05 | Payer: Medicaid Other

## 2012-08-05 ENCOUNTER — Encounter: Payer: Self-pay | Admitting: *Deleted

## 2012-08-05 DIAGNOSIS — J09X1 Influenza due to identified novel influenza A virus with pneumonia: Secondary | ICD-10-CM

## 2012-08-05 DIAGNOSIS — G893 Neoplasm related pain (acute) (chronic): Secondary | ICD-10-CM

## 2012-08-05 DIAGNOSIS — J1100 Influenza due to unidentified influenza virus with unspecified type of pneumonia: Secondary | ICD-10-CM

## 2012-08-05 DIAGNOSIS — C7951 Secondary malignant neoplasm of bone: Principal | ICD-10-CM

## 2012-08-05 DIAGNOSIS — C801 Malignant (primary) neoplasm, unspecified: Secondary | ICD-10-CM

## 2012-08-05 LAB — INFLUENZA PANEL BY PCR (TYPE A & B)
H1N1 flu by pcr: DETECTED — AB
Influenza B By PCR: NEGATIVE

## 2012-08-05 LAB — COMPREHENSIVE METABOLIC PANEL
Alkaline Phosphatase: 224 U/L — ABNORMAL HIGH (ref 39–117)
BUN: 7 mg/dL (ref 6–23)
CO2: 24 mEq/L (ref 19–32)
Chloride: 98 mEq/L (ref 96–112)
GFR calc Af Amer: >90 mL/min (ref >90)
GFR calc non Af Amer: >90 mL/min (ref >90)
Glucose, Bld: 97 mg/dL (ref 70–99)
Potassium: 2.8 mEq/L — ABNORMAL LOW (ref 3.5–5.1)
Total Bilirubin: 0.5 mg/dL (ref 0.3–1.2)
Total Protein: 5.8 g/dL — ABNORMAL LOW (ref 6.0–8.3)

## 2012-08-05 LAB — TROPONIN I: Troponin I: <0.3 ng/mL (ref <0.30)

## 2012-08-05 LAB — CBC
HCT: 25 % — ABNORMAL LOW (ref 36.0–46.0)
Hemoglobin: 8 g/dL — ABNORMAL LOW (ref 12.0–15.0)
MCHC: 32 g/dL (ref 30.0–36.0)

## 2012-08-05 LAB — MAGNESIUM: Magnesium: 1.5 mg/dL (ref 1.5–2.5)

## 2012-08-05 MED ORDER — OSELTAMIVIR PHOSPHATE 75 MG PO CAPS
75.0000 mg | ORAL_CAPSULE | Freq: Two times a day (BID) | ORAL | Status: AC
  Administered 2012-08-05 – 2012-08-09 (×10): 75 mg via ORAL
  Filled 2012-08-05 (×10): qty 1

## 2012-08-05 MED ORDER — ALPRAZOLAM 0.25 MG PO TABS
0.2500 mg | ORAL_TABLET | Freq: Two times a day (BID) | ORAL | Status: DC
  Administered 2012-08-05 – 2012-08-10 (×11): 0.25 mg via ORAL
  Filled 2012-08-05 (×11): qty 1

## 2012-08-05 MED ORDER — VANCOMYCIN HCL IN DEXTROSE 1-5 GM/200ML-% IV SOLN
1000.0000 mg | Freq: Two times a day (BID) | INTRAVENOUS | Status: DC
  Administered 2012-08-05 – 2012-08-09 (×9): 1000 mg via INTRAVENOUS
  Filled 2012-08-05 (×13): qty 200

## 2012-08-05 MED ORDER — POTASSIUM CHLORIDE IN NACL 20-0.9 MEQ/L-% IV SOLN
INTRAVENOUS | Status: DC
  Administered 2012-08-05 – 2012-08-10 (×5): via INTRAVENOUS
  Filled 2012-08-05 (×11): qty 1000

## 2012-08-05 MED ORDER — ENSURE COMPLETE PO LIQD
237.0000 mL | Freq: Two times a day (BID) | ORAL | Status: DC
  Administered 2012-08-05 – 2012-08-10 (×7): 237 mL via ORAL

## 2012-08-05 MED ORDER — POTASSIUM CHLORIDE CRYS ER 20 MEQ PO TBCR
40.0000 meq | EXTENDED_RELEASE_TABLET | Freq: Three times a day (TID) | ORAL | Status: AC
  Administered 2012-08-05 (×3): 40 meq via ORAL
  Filled 2012-08-05 (×3): qty 2

## 2012-08-05 MED ORDER — ACETAMINOPHEN 325 MG PO TABS
650.0000 mg | ORAL_TABLET | Freq: Once | ORAL | Status: AC
  Administered 2012-08-05: 650 mg via ORAL
  Filled 2012-08-05: qty 2

## 2012-08-05 NOTE — Progress Notes (Signed)
Spoke w/Dr Michell Heinrich re: discuss pt's status and possible radiation treatment on 08/08/12. Per Dr Michell Heinrich, pt's radiation treatment on 08/08/12 is cancelled. Will check pt's status on 08/09/12 and report to Dr Michell Heinrich. Amy, RT on linac #2 notified.

## 2012-08-05 NOTE — Progress Notes (Signed)
[facility] PROGRESS NOTE  [name] WUJ:[id number] DOB: 10/27/1965 DOA: 08/04/2012 PCP: No primary [name] on file.  Assessment/Plan: Principal Problem:  Abdominal pain & Back pain and New 11th rib pathologic fracture  -A discussed above possibly secondary to metastatic disease- slight progression of liver disease with areas suggesting developing areas of necrosis noted and new 11th rib pathologic fracture noted as above  -UA is neg for UTI,  Lipase nl, CEs neg -pain management - IV dilaudid prn, and continue outpt scheduled oxycontin and prn percocet  Active Problems: Influenza A. -Will start Tamiflu HCAP (healthcare-associated pneumonia)  -Continue current empiric antibiotics with Vanc, Levaquin,and cefepime  -Urinary Legionella and strep pneumo antigen is negative. -Follow blood cultures. 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  -followed by Dr [name], have consulted oncology for further recommendations.  Bil Pleural effusions  - Possibly secondary to infection vs malignancy  -abx as above  - fevers likely secondary to influenza -Await oncology input. Anemia  -secondary to ca  - monitor and transfuse as appropriate, no gross bleeding  Hypokalemia  -Did not tolerate IV runs last BM, will replace k -follow and recheck. Tachycardia  -secondary to pain & infection  -Treating as above, cardiac enzymes negative. Constipation  -likely secondary to narcotics  -Resolving  With current bowel regimen- laxatives,enemas and follow   Code Status: DNR Family Communication: Pt at bedside Disposition Plan: pending clinical course   Consultants:  Oncology- eval pending  Procedures:  none  Antibiotics:  Levaquin, cefepime, and vancomycin started on 3/6  Tamiflu was started on 3/7  HPI/Subjective: Per nursing patient had bowel movement after enema last p.m. she was decreased abdominal pain but still with intermittent pain(all over) paroxysms. Per nursing lot of  anxiety   Objective: Filed Vitals:   08/05/12 0600 08/05/12 0700 08/05/12 0800 08/05/12 0841  BP: 129/68 133/74  141/80  Pulse: 109 111  118  Temp:   101.3 F (38.5 C)   TempSrc:   Axillary   Resp: 21 20  23   Height:      Weight:      SpO2: 95% 91%  97%    Intake/Output Summary (Last 24 hours) at 08/05/12 0913 Last data filed at 08/05/12 0825  Gross per 24 hour  Intake   3825 ml  Output   3651 ml  Net    174 ml   Filed Weights   08/04/12 1800  Weight: 60.7 kg (133 lb 13.1 oz)    Exam:   General:   in no respiratory distress   Cardiovascular: Tachycardia, regular normal S1-S2.   Respiratory: decreased breath sounds in the lower lung fields.  Abdomen: Mildly distended, bowel sounds present, decreased tenderness    extremities: No cyanosis and no edema  Data Reviewed: Basic Metabolic Panel:  Recent Labs Lab 08/03/12 1609 08/04/12 0915 08/05/12 0530 08/05/12 0535  NA 142 139 135  --   K 3.2* 3.2* 2.8*  --   CL 103 100 98  --   CO2 27 27 24   --   GLUCOSE 209* 110* 97  --   BUN 9.3 7 7   --   CREATININE 0.9 0.69 0.66  --   CALCIUM 9.4 8.8 8.1*  --   MG  --   --   --  1.5   Liver Function Tests:  Recent Labs Lab 08/03/12 1609 08/04/12 0915 08/05/12 0530  AST 39* 38* 41*  ALT 19 17 13   ALKPHOS 293* 256* 224*  BILITOT 0.45 0.5 0.5  PROT 6.9 6.5 5.8*  ALBUMIN 3.1* 3.1* 2.6*    Recent Labs Lab 08/04/12 1840  LIPASE 7*   No results found for this basename: AMMONIA,  in the last 168 hours CBC:  Recent Labs Lab 08/03/12 1609 08/04/12 0915 08/05/12 0530  WBC 12.2* 7.7 6.9  NEUTROABS 11.3* 7.2  --   HGB 9.2* 8.9* 8.0*  HCT 27.6* 26.2* 25.0*  MCV 78.7* 78.9 78.6  PLT 362 319 272   Cardiac Enzymes:  Recent Labs Lab 08/04/12 1840 08/04/12 2320 08/05/12 0530  TROPONINI <0.30 <0.30 <0.30   BNP (last 3 results) No results found for this basename: PROBNP,  in the last 8760 hours CBG: No results found for this basename: GLUCAP,  in the  last 168 hours  Recent Results (from the past 240 hour(s))  CULTURE, BLOOD (ROUTINE X 2)     Status: None   Collection Time    08/04/12  1:55 PM      Result Value Range Status   Specimen Description BLOOD LEFT ARM   Final   Special Requests BOTTLES DRAWN AEROBIC AND ANAEROBIC   Final   Culture  Setup Time 08/04/2012 22:18   Final   Culture     Final   Value:        BLOOD CULTURE RECEIVED NO GROWTH TO DATE CULTURE WILL BE HELD FOR 5 DAYS BEFORE ISSUING A FINAL NEGATIVE REPORT   Report Status PENDING   Incomplete  CULTURE, BLOOD (ROUTINE X 2)     Status: None   Collection Time    08/04/12  2:00 PM      Result Value Range Status   Specimen Description BLOOD LEFT HAND   Final   Special Requests BOTTLES DRAWN AEROBIC AND ANAEROBIC   Final   Culture  Setup Time 08/04/2012 22:18   Final   Culture     Final   Value:        BLOOD CULTURE RECEIVED NO GROWTH TO DATE CULTURE WILL BE HELD FOR 5 DAYS BEFORE ISSUING A FINAL NEGATIVE REPORT   Report Status PENDING   Incomplete  MRSA PCR SCREENING     Status: None   Collection Time    08/04/12  5:15 PM      Result Value Range Status   MRSA by PCR NEGATIVE  NEGATIVE Final   Comment:            The GeneXpert MRSA Assay (FDA     approved for NASAL specimens     only), is one component of a     comprehensive MRSA colonization     surveillance program. It is not     intended to diagnose MRSA     infection nor to guide or     monitor treatment for     MRSA infections.     Studies: Ct Chest W Contrast  08/04/2012  *RADIOLOGY REPORT*  Clinical Data:  Abdominal pain.  Metastatic breast cancer.  CT CHEST, ABDOMEN AND PELVIS WITH CONTRAST  Technique:  Multidetector CT imaging of the chest, abdomen and pelvis was performed following the standard protocol during bolus administration of intravenous contrast.  Contrast: OMNIPAQUE IOHEXOL 300 MG/ML  SOLN,  Comparison:  Chest CT 04/20/2012.  CT of the abdomen and pelvis 07/07/2012.  CT CHEST   Findings:  Mediastinum: Heart size is normal. There is no significant pericardial fluid, thickening or pericardial calcification.  There is some prominent but not enlarged nodal tissue in the low right  paratracheal station which appears to represent a cluster of small lymph nodes.  Several other small borderline enlarged lymph nodes are noted in the mediastinum, most notably in the subcarinal station.  No definite pathologic nodal enlargement is noted. Esophagus is unremarkable in appearance.  Lungs/Pleura: Evaluation of the lung parenchyma is limited by extensive patient respiratory motion.  With this limitation in mind there is new peribronchovascular ground-glass attenuation air space disease in the left upper lobe, and to a lesser extent in the inferior segments of the left lower lobe, concerning for multilobar pneumonia.  No definite suspicious appearing pulmonary nodules or masses are confidently identified.  Moderate bilateral pleural effusions layer dependently and are relatively simple in appearance, however, there are some areas of pleural thickening and enhancement, best demonstrated on image 43 of series 2 in the posterior aspect of the right pleural cavity, suggesting that there may be malignant pleural involvement.  Musculoskeletal: Postoperative changes of modified radical right sided mastectomy with right axillary clips compatible with prior nodal dissection.  Innumerable mixed lytic and sclerotic lesions are again noted throughout the visualized axial and appendicular skeleton, most pronounced throughout the thoracic spine.  There is a new nondisplaced pathologic fracture through the posterior aspect of the right eleventh rib.  Postoperative changes of laminectomy are noted at its T9-T10. Enhancing soft tissue in the epidural space in the left side of the upper thoracic spine in the right side of the lower thoracic spine, concerning for additional sites of metastatic disease.  No definite pathologic  vertebral body compression fractures are identified at this time.  IMPRESSION:  1. Enlarging moderate bilateral pleural effusions, with some pleural thickening and enhancement, suggesting malignant pleural metastases.  Sampling of pleural fluid may be warranted if clinically indicated. 2. Progression of metastatic disease to the bones, particularly throughout the thoracic spine, with a new nondisplaced pathologic fracture of the posterior aspect of the right eleventh rib. Enhancing soft tissue in the epidural space adjacent to both the upper and lower thoracic spine is also noted, better demonstrated on recent MRI. 3.  New multifocal peribronchovascular air space disease throughout the left upper and lower lobe concerning for multilobar bronchopneumonia.  CT ABDOMEN AND PELVIS  Findings:  Abdomen/Pelvis: There are again innumerable hepatic nodules and masses, compatible with widespread metastatic disease, largest of which measures up to 10.8 x 9.8 cm in the right lobe of the liver (image 48 of series 2).  Overall, the size of these lesions appears very similar to the prior examination, if not slightly increased, but there is increasing central low attenuation within many of these lesions, suggesting internal areas of necrosis and vascular compromise.  Some lesions do appear slightly larger, including a small lesion in the right lobe of the liver on image 66 of series 2 which currently measures 13 mm (previously only 9 mm).  The appearance of the gallbladder, pancreas, spleen, bilateral adrenal glands in the right kidney is unremarkable.  Parenchymal thinning in the interpolar region of the left kidney, and a tiny 2 mm nonobstructive calculus in the interpolar left kidney are both unchanged.  A tiny volume of ascites in the anatomic pelvis is simple in appearance.  Uterus is heterogeneous in appearance with multifocal low attenuation and partially calcified lesions, likely represent fibroids.  Ovaries are  unremarkable in appearance. Assessment of the anatomic pelvis is significantly limited by extensive beam hardening artifact from the patient's right sided hip prosthesis.  The urinary bladder is moderately distended without focal abnormalities.  No  pneumoperitoneum.  No pathologic distension of small bowel.  No definite pathologic lymphadenopathy identified within the abdomen or pelvis.  Relatively large volume of well formed stool throughout the colon may suggest constipation.  Musculoskeletal: Status post right total hip arthroplasty. Numerous mixed lytic and sclerotic lesions are again noted throughout the visualized axial and appendicular skeleton, similar in appearance to prior examinations.  IMPRESSION:  1.  Overall, the burden of metastatic disease in the bones and the liver is very similar to the prior examination, with suggestion of slight progression in the liver, as above.  Importantly, many of the liver lesions do show areas of internal decreased enhancement, which could suggest developing areas of necrosis. 2.  New small volume of ascites. 3.  Moderate volume of well formed stool throughout the colon may suggest constipation. 4.  Additional incidental findings, as above.   Original Report Authenticated By: Trudie Reed, M.D.    Ct Chest W Contrast  08/04/2012  *RADIOLOGY REPORT*  Clinical Data:  Abdominal pain.  Metastatic breast cancer.  CT CHEST, ABDOMEN AND PELVIS WITH CONTRAST  Technique:  Multidetector CT imaging of the chest, abdomen and pelvis was performed following the standard protocol during bolus administration of intravenous contrast.  Contrast: OMNIPAQUE IOHEXOL 300 MG/ML  SOLN,  Comparison:  Chest CT 04/20/2012.  CT of the abdomen and pelvis 07/07/2012.  CT CHEST  Findings:  Mediastinum: Heart size is normal. There is no significant pericardial fluid, thickening or pericardial calcification.  There is some prominent but not enlarged nodal tissue in the low right paratracheal  station which appears to represent a cluster of small lymph nodes.  Several other small borderline enlarged lymph nodes are noted in the mediastinum, most notably in the subcarinal station.  No definite pathologic nodal enlargement is noted. Esophagus is unremarkable in appearance.  Lungs/Pleura: Evaluation of the lung parenchyma is limited by extensive patient respiratory motion.  With this limitation in mind there is new peribronchovascular ground-glass attenuation air space disease in the left upper lobe, and to a lesser extent in the inferior segments of the left lower lobe, concerning for multilobar pneumonia.  No definite suspicious appearing pulmonary nodules or masses are confidently identified.  Moderate bilateral pleural effusions layer dependently and are relatively simple in appearance, however, there are some areas of pleural thickening and enhancement, best demonstrated on image 43 of series 2 in the posterior aspect of the right pleural cavity, suggesting that there may be malignant pleural involvement.  Musculoskeletal: Postoperative changes of modified radical right sided mastectomy with right axillary clips compatible with prior nodal dissection.  Innumerable mixed lytic and sclerotic lesions are again noted throughout the visualized axial and appendicular skeleton, most pronounced throughout the thoracic spine.  There is a new nondisplaced pathologic fracture through the posterior aspect of the right eleventh rib.  Postoperative changes of laminectomy are noted at its T9-T10. Enhancing soft tissue in the epidural space in the left side of the upper thoracic spine in the right side of the lower thoracic spine, concerning for additional sites of metastatic disease.  No definite pathologic vertebral body compression fractures are identified at this time.  IMPRESSION:  1. Enlarging moderate bilateral pleural effusions, with some pleural thickening and enhancement, suggesting malignant pleural  metastases.  Sampling of pleural fluid may be warranted if clinically indicated. 2. Progression of metastatic disease to the bones, particularly throughout the thoracic spine, with a new nondisplaced pathologic fracture of the posterior aspect of the right eleventh rib. Enhancing soft tissue  in the epidural space adjacent to both the upper and lower thoracic spine is also noted, better demonstrated on recent MRI. 3.  New multifocal peribronchovascular air space disease throughout the left upper and lower lobe concerning for multilobar bronchopneumonia.  CT ABDOMEN AND PELVIS  Findings:  Abdomen/Pelvis: There are again innumerable hepatic nodules and masses, compatible with widespread metastatic disease, largest of which measures up to 10.8 x 9.8 cm in the right lobe of the liver (image 48 of series 2).  Overall, the size of these lesions appears very similar to the prior examination, if not slightly increased, but there is increasing central low attenuation within many of these lesions, suggesting internal areas of necrosis and vascular compromise.  Some lesions do appear slightly larger, including a small lesion in the right lobe of the liver on image 66 of series 2 which currently measures 13 mm (previously only 9 mm).  The appearance of the gallbladder, pancreas, spleen, bilateral adrenal glands in the right kidney is unremarkable.  Parenchymal thinning in the interpolar region of the left kidney, and a tiny 2 mm nonobstructive calculus in the interpolar left kidney are both unchanged.  A tiny volume of ascites in the anatomic pelvis is simple in appearance.  Uterus is heterogeneous in appearance with multifocal low attenuation and partially calcified lesions, likely represent fibroids.  Ovaries are unremarkable in appearance. Assessment of the anatomic pelvis is significantly limited by extensive beam hardening artifact from the patient's right sided hip prosthesis.  The urinary bladder is moderately distended  without focal abnormalities.  No pneumoperitoneum.  No pathologic distension of small bowel.  No definite pathologic lymphadenopathy identified within the abdomen or pelvis.  Relatively large volume of well formed stool throughout the colon may suggest constipation.  Musculoskeletal: Status post right total hip arthroplasty. Numerous mixed lytic and sclerotic lesions are again noted throughout the visualized axial and appendicular skeleton, similar in appearance to prior examinations.  IMPRESSION:  1.  Overall, the burden of metastatic disease in the bones and the liver is very similar to the prior examination, with suggestion of slight progression in the liver, as above.  Importantly, many of the liver lesions do show areas of internal decreased enhancement, which could suggest developing areas of necrosis. 2.  New small volume of ascites. 3.  Moderate volume of well formed stool throughout the colon may suggest constipation. 4.  Additional incidental findings, as above.   Original Report Authenticated By: Trudie Reed, M.D.    Ct Abdomen Pelvis W Contrast  08/04/2012  *RADIOLOGY REPORT*  Clinical Data:  Abdominal pain.  Metastatic breast cancer.  CT CHEST, ABDOMEN AND PELVIS WITH CONTRAST  Technique:  Multidetector CT imaging of the chest, abdomen and pelvis was performed following the standard protocol during bolus administration of intravenous contrast.  Contrast: OMNIPAQUE IOHEXOL 300 MG/ML  SOLN,  Comparison:  Chest CT 04/20/2012.  CT of the abdomen and pelvis 07/07/2012.  CT CHEST  Findings:  Mediastinum: Heart size is normal. There is no significant pericardial fluid, thickening or pericardial calcification.  There is some prominent but not enlarged nodal tissue in the low right paratracheal station which appears to represent a cluster of small lymph nodes.  Several other small borderline enlarged lymph nodes are noted in the mediastinum, most notably in the subcarinal station.  No definite  pathologic nodal enlargement is noted. Esophagus is unremarkable in appearance.  Lungs/Pleura: Evaluation of the lung parenchyma is limited by extensive patient respiratory motion.  With this limitation in  mind there is new peribronchovascular ground-glass attenuation air space disease in the left upper lobe, and to a lesser extent in the inferior segments of the left lower lobe, concerning for multilobar pneumonia.  No definite suspicious appearing pulmonary nodules or masses are confidently identified.  Moderate bilateral pleural effusions layer dependently and are relatively simple in appearance, however, there are some areas of pleural thickening and enhancement, best demonstrated on image 43 of series 2 in the posterior aspect of the right pleural cavity, suggesting that there may be malignant pleural involvement.  Musculoskeletal: Postoperative changes of modified radical right sided mastectomy with right axillary clips compatible with prior nodal dissection.  Innumerable mixed lytic and sclerotic lesions are again noted throughout the visualized axial and appendicular skeleton, most pronounced throughout the thoracic spine.  There is a new nondisplaced pathologic fracture through the posterior aspect of the right eleventh rib.  Postoperative changes of laminectomy are noted at its T9-T10. Enhancing soft tissue in the epidural space in the left side of the upper thoracic spine in the right side of the lower thoracic spine, concerning for additional sites of metastatic disease.  No definite pathologic vertebral body compression fractures are identified at this time.  IMPRESSION:  1. Enlarging moderate bilateral pleural effusions, with some pleural thickening and enhancement, suggesting malignant pleural metastases.  Sampling of pleural fluid may be warranted if clinically indicated. 2. Progression of metastatic disease to the bones, particularly throughout the thoracic spine, with a new nondisplaced pathologic  fracture of the posterior aspect of the right eleventh rib. Enhancing soft tissue in the epidural space adjacent to both the upper and lower thoracic spine is also noted, better demonstrated on recent MRI. 3.  New multifocal peribronchovascular air space disease throughout the left upper and lower lobe concerning for multilobar bronchopneumonia.  CT ABDOMEN AND PELVIS  Findings:  Abdomen/Pelvis: There are again innumerable hepatic nodules and masses, compatible with widespread metastatic disease, largest of which measures up to 10.8 x 9.8 cm in the right lobe of the liver (image 48 of series 2).  Overall, the size of these lesions appears very similar to the prior examination, if not slightly increased, but there is increasing central low attenuation within many of these lesions, suggesting internal areas of necrosis and vascular compromise.  Some lesions do appear slightly larger, including a small lesion in the right lobe of the liver on image 66 of series 2 which currently measures 13 mm (previously only 9 mm).  The appearance of the gallbladder, pancreas, spleen, bilateral adrenal glands in the right kidney is unremarkable.  Parenchymal thinning in the interpolar region of the left kidney, and a tiny 2 mm nonobstructive calculus in the interpolar left kidney are both unchanged.  A tiny volume of ascites in the anatomic pelvis is simple in appearance.  Uterus is heterogeneous in appearance with multifocal low attenuation and partially calcified lesions, likely represent fibroids.  Ovaries are unremarkable in appearance. Assessment of the anatomic pelvis is significantly limited by extensive beam hardening artifact from the patient's right sided hip prosthesis.  The urinary bladder is moderately distended without focal abnormalities.  No pneumoperitoneum.  No pathologic distension of small bowel.  No definite pathologic lymphadenopathy identified within the abdomen or pelvis.  Relatively large volume of well  formed stool throughout the colon may suggest constipation.  Musculoskeletal: Status post right total hip arthroplasty. Numerous mixed lytic and sclerotic lesions are again noted throughout the visualized axial and appendicular skeleton, similar in appearance to prior examinations.  IMPRESSION:  1.  Overall, the burden of metastatic disease in the bones and the liver is very similar to the prior examination, with suggestion of slight progression in the liver, as above.  Importantly, many of the liver lesions do show areas of internal decreased enhancement, which could suggest developing areas of necrosis. 2.  New small volume of ascites. 3.  Moderate volume of well formed stool throughout the colon may suggest constipation. 4.  Additional incidental findings, as above.   Original Report Authenticated By: Trudie Reed, M.D.     Scheduled Meds: . ceFEPime (MAXIPIME) IV  1 g Intravenous Q8H  . docusate sodium  100 mg Oral BID  . enoxaparin (LOVENOX) injection  40 mg Subcutaneous Q24H  . ferrous sulfate  325 mg Oral TID PC  . guaiFENesin  600 mg Oral BID  . levofloxacin (LEVAQUIN) IV  750 mg Intravenous Q24H  . OxyCODONE  60 mg Oral Q12H  . pantoprazole (PROTONIX) IV  40 mg Intravenous Q breakfast  . potassium chloride  40 mEq Oral TID  . vancomycin  1,000 mg Intravenous Q8H   Continuous Infusions: . 0.9 % NaCl with KCl 20 mEq / L 125 mL/hr at 08/05/12 5784    Principal Problem:   Abdominal pain Active Problems:   HCAP (healthcare-associated pneumonia)   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   Constipation   Back pain   Anemia   Hypokalemia   Tachycardia    Time spent: >35    Kela Millin  [facility] Pager [phone]. If 7PM-7AM, please contact night-coverage at [email or web address], password [facility] 08/05/2012, 9:13 AM  LOS: 1 day

## 2012-08-05 NOTE — Progress Notes (Signed)
Patient anxious at this time. Emotional support was given by Linus Orn. Patient still anxious.  Vitals are stable. Medication Prudy Feeler) given to patient for anxiety. SEE MAR.

## 2012-08-05 NOTE — Progress Notes (Signed)
ANTIBIOTIC CONSULT NOTE - FOLLOW UP  Pharmacy Consult for Vancomycin, Cefepime, and Levaquin Indication: HCAP  No Known Allergies  Patient Measurements: Height: 5\' 3"  (160 cm) Weight: 133 lb 13.1 oz (60.7 kg) IBW/kg (Calculated) : 52.4  Vital Signs: Temp: 101.3 F (38.5 C) (03/07 0800) Temp src: Axillary (03/07 0800) BP: 141/73 mmHg (03/07 0900) Pulse Rate: 117 (03/07 0900)  Labs:  Recent Labs  08/03/12 1609 08/04/12 0915 08/05/12 0530  WBC 12.2* 7.7 6.9  HGB 9.2* 8.9* 8.0*  PLT 362 319 272  CREATININE 0.9 0.69 0.66   Estimated Creatinine Clearance: 71.9 ml/min (by C-G formula based on Cr of 0.66).  Recent Labs  08/05/12 1430  VANCOTROUGH 18.9     Microbiology: Recent Results (from the past 720 hour(s))  CULTURE, BLOOD (ROUTINE X 2)     Status: None   Collection Time    08/04/12  1:55 PM      Result Value Range Status   Specimen Description BLOOD LEFT ARM   Final   Special Requests BOTTLES DRAWN AEROBIC AND ANAEROBIC   Final   Culture  Setup Time 08/04/2012 22:18   Final   Culture     Final   Value:        BLOOD CULTURE RECEIVED NO GROWTH TO DATE CULTURE WILL BE HELD FOR 5 DAYS BEFORE ISSUING A FINAL NEGATIVE REPORT   Report Status PENDING   Incomplete  CULTURE, BLOOD (ROUTINE X 2)     Status: None   Collection Time    08/04/12  2:00 PM      Result Value Range Status   Specimen Description BLOOD LEFT HAND   Final   Special Requests BOTTLES DRAWN AEROBIC AND ANAEROBIC   Final   Culture  Setup Time 08/04/2012 22:18   Final   Culture     Final   Value:        BLOOD CULTURE RECEIVED NO GROWTH TO DATE CULTURE WILL BE HELD FOR 5 DAYS BEFORE ISSUING A FINAL NEGATIVE REPORT   Report Status PENDING   Incomplete  MRSA PCR SCREENING     Status: None   Collection Time    08/04/12  5:15 PM      Result Value Range Status   MRSA by PCR NEGATIVE  NEGATIVE Final   Comment:            The GeneXpert MRSA Assay (FDA     approved for NASAL specimens     only),  is one component of a     comprehensive MRSA colonization     surveillance program. It is not     intended to diagnose MRSA     infection nor to guide or     monitor treatment for     MRSA infections.    Anti-infectives   Start     Dose/Rate Route Frequency Ordered Stop   08/04/12 2200  vancomycin (VANCOCIN) IVPB 1000 mg/200 mL premix     1,000 mg 200 mL/hr over 60 Minutes Intravenous Every 8 hours 08/04/12 1335     08/04/12 1400  ceFEPIme (MAXIPIME) 1 g in dextrose 5 % 50 mL IVPB     1 g 100 mL/hr over 30 Minutes Intravenous 3 times per day 08/04/12 1312 08/12/12 1359   08/04/12 1330  vancomycin (VANCOCIN) IVPB 1000 mg/200 mL premix     1,000 mg 200 mL/hr over 60 Minutes Intravenous  Once 08/04/12 1325 08/04/12 1659   08/04/12 1315  levofloxacin (LEVAQUIN) IVPB  750 mg     750 mg 100 mL/hr over 90 Minutes Intravenous Every 24 hours 08/04/12 1312 08/07/12 1359      Assessment:  46 yof admit with metastatic breast cancer and concern for HCAP d/t SOB and cough.  Current Tmax 101.8, WBC wnl, and SCr improving with CrCl ~ 72 ml/min CG  Blood culture NGTD.  Vancomycin trough level 18.9 (therapeutic)  Goal of Therapy:  Vancomycin trough level 15-20 mcg/ml Appropriate abx dosing, eradication of infection.   Plan:   Continue Cefepime 1g IV q8h  Continue Levaquin 750mg  IV q8h  Decrease to Vancomycin 1g IV q12h  Measure Vanc trough at steady state.  Follow up renal fxn and culture results.  Lynann Beaver PharmD, BCPS Pager [phone] 08/05/2012 3:17 PM

## 2012-08-05 NOTE — Progress Notes (Signed)
INITIAL NUTRITION ASSESSMENT  DOCUMENTATION CODES Per approved criteria  -Not Applicable   INTERVENTION: Provide Ensure Complete po BID, each supplement provides 350 kcal and 13 grams of protein.   NUTRITION DIAGNOSIS: Inadequate oral intake related to decreased appetite as evidenced by pt reports of poor po inatke for 2-3 weeks.   Goal: Pt to meet >/= 90% of their estimated nutrition needs  Monitor:  PO intake Wt BM  Reason for Assessment: MST score of 3  47 y.o. female  Admitting Dx: Abdominal pain  ASSESSMENT: 47 y.o. female with PMH as listed below including breast cancer with mets to liver and thoracic vertebrae who presents worsening pain especially in abd and back, as well as cough. Pt reports that she has had a decreased appetite for 2 to 3 weeks and has been eating very little- only picking at food and eating small snacks. Pt denies wt loss stating she usually weighs about 133 lbs. Pt reports she has not eaten anything today but, ordered lunch around 2:30 PM    Height: Ht Readings from Last 1 Encounters:  08/04/12 5\' 3"  (1.6 m)    Weight: Wt Readings from Last 1 Encounters:  08/04/12 133 lb 13.1 oz (60.7 kg)    Ideal Body Weight: 115 lbs  % Ideal Body Weight: 116%  Wt Readings from Last 10 Encounters:  08/04/12 133 lb 13.1 oz (60.7 kg)  08/03/12 133 lb 1.6 oz (60.374 kg)  07/21/12 132 lb 1.6 oz (59.92 kg)  07/22/12 130 lb 4.8 oz (59.104 kg)  07/09/12 134 lb (60.782 kg)  06/21/12 135 lb 12.8 oz (61.598 kg)  06/09/12 138 lb (62.596 kg)  05/23/12 138 lb (62.596 kg)  05/23/12 138 lb 1.6 oz (62.642 kg)  05/13/12 146 lb 9.6 oz (66.497 kg)    Usual Body Weight: 133 lbs  % Usual Body Weight: 100%  BMI:  Body mass index is 23.71 kg/(m^2).  Estimated Nutritional Needs: Kcal: 1820-2000 Protein: 73-85 grams Fluid: 2.1 L  Skin: dry, intact  Diet Order: General  EDUCATION NEEDS: -No education needs identified at this time   Intake/Output  Summary (Last 24 hours) at 08/05/12 1441 Last data filed at 08/05/12 1200  Gross per 24 hour  Intake   4200 ml  Output   4400 ml  Net   -200 ml    Last BM: 07/23/12  Labs:   Recent Labs Lab 08/03/12 1609 08/04/12 0915 08/05/12 0530 08/05/12 0535  NA 142 139 135  --   K 3.2* 3.2* 2.8*  --   CL 103 100 98  --   CO2 27 27 24   --   BUN 9.3 7 7   --   CREATININE 0.9 0.69 0.66  --   CALCIUM 9.4 8.8 8.1*  --   MG  --   --   --  1.5  GLUCOSE 209* 110* 97  --     CBG (last 3)  No results found for this basename: GLUCAP,  in the last 72 hours  Scheduled Meds: . ALPRAZolam  0.25 mg Oral BID  . ceFEPime (MAXIPIME) IV  1 g Intravenous Q8H  . docusate sodium  100 mg Oral BID  . enoxaparin (LOVENOX) injection  40 mg Subcutaneous Q24H  . ferrous sulfate  325 mg Oral TID PC  . guaiFENesin  600 mg Oral BID  . levofloxacin (LEVAQUIN) IV  750 mg Intravenous Q24H  . oseltamivir  75 mg Oral BID  . OxyCODONE  60 mg Oral Q12H  .  pantoprazole (PROTONIX) IV  40 mg Intravenous Q breakfast  . potassium chloride  40 mEq Oral TID  . vancomycin  1,000 mg Intravenous Q8H    Continuous Infusions: . 0.9 % NaCl with KCl 20 mEq / L 125 mL/hr at 08/05/12 9811    Past Medical History  Diagnosis Date  . Hypertension   . GERD (gastroesophageal reflux disease)   . 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 Pyelonephritis 10/01/11  . Recurrent UTI (urinary tract infection) 10/01/11  . S/P chemotherapy, time since 4-12 weeks   . S/P radiation > 12 weeks 03/15/12- 04/07/12    right hip, clivus  . 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 Brain cancer     spread from breast ca  . Hypercalcemia 06/21/2012  . History of radiation therapy 08/06/10 -09/16/10    R supraclav fossa, R chest wall, R drain site    Past Surgical History  Procedure Laterality Date  . Cesarean section  1989; 2010  . Mastectomy  2011    right  . Back surgery  2012    "removed tumor,cancer,  from my spine  . Port-a-cath removal  2012    right chest  . Portacath placement  2011    right  . Tubal ligation  2010  . Breast biopsy  2011    right  . Hip arthroplasty  05/04/2012    Procedure: ARTHROPLASTY BIPOLAR HIP;  Surgeon: Shelda Pal, MD;  Location: [facility];  Service: Orthopedics;  Laterality: Right;  RIGHT HIP HEMI ARTHROPLASTY     Ian Malkin RD, LDN Inpatient Clinical Dietitian Pager: [phone] After Hours Pager: [phone]

## 2012-08-05 NOTE — Consult Note (Signed)
[facility]  [phone]   HEMATOLOGY ONCOLOGY CONSULTATION   [name]  DOB: 1966-02-15  MR#: [id number]  CSN#: [id number]    Requesting Physician: [facility]  Primary MD:   Reason for Consultation: Breast Cancer  47 y.o. [place] woman   1. 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   2.Status post adjuvant chemotherapy July to November 2011, consisting of 4 cycles of dose dense doxorubicin and cyclophosphamide, followed by 4 of 12 planned weekly doses of paclitaxel given adjuvantly, discontinued secondary to peripheral neuropathy.   3. Status post radiation to the right chest wall, truncated due to problems with the patient missing appointments.   4. Tamoxifen started June of 2012, but discontinued due to nausea.   5. metastatic disease to liver and bone pathologically documented October of 2012, estrogen receptor 100% positive, progesterone receptor 7% positive, with no HER-2 amplification; treatment in the metastatic setting has consisted of:   6.Faslodex started 03/16/2011, discontinued November 2013 with progression. Zoladex every 3 months also started on 03/16/2011, and monthly zoledronic acid started November 2011, the Zometa I currently held due to pending dental procedures and possible extractions.   7. Status post thoracic laminectomy, T9-T11, for impending cord compression on 03/21/2011, followed by irradiation to the spine completed November 2011   8. 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 s/p palliative radiation to the Right hip clivus to 30 Gy completed 04/07/2012   10. Progression noted by scans in November 2013, at which  time Faslodex was discontinued. Patient was started on exemestane in early December, with everolimus started in mid December at 5 mg daily. Continues to receive Zoladex injections every 3 months, last given 05/13/2012.She was due for new dose today, 3/7.   11. Nondisplaced right femoral neck fracture, status post right hip arthroplasty on 05/04/2012 under the care of Dr.[facility].   12. Hypercalcemia with elevated serum creatinine: resolved   13. Receiving palliative radiation therapy for cord compression, currently on hold due to hospitalization.  History of present illness:   Patient was admitted on 08/04/2012 with one week history of shortness of breath and cough, as well as generalized,uncontrolled pain, including significant abdominal and back discomfort despite multiple doses of IV dilaudid. Scheduled oxycontin and prn percocet were added. Of note,she had been hospitalized for back pain. She was discharged on February 22.She is currently receiving palliative radiation therapy under the care of Dr. Michell Mullins.   a CT of the chest performed On 08/04/2012 in ED which revealed enlarging moderate bilateral pleural effusions, with some pleural thickening and enhancement, suggesting malignant pleural metastases.Progression of metastatic disease to the bones, particularly throughout the thoracic spine, with a new nondisplaced pathologic fracture of the posterior aspect of the right eleventh rib was noted.New multifocal peribronchovascular air space disease throughout the left upper and lower lobe concerning for multilobar bronchopneumonia was seen.CT of abdomen on the same day with contrast showed  burden of metastatic disease in the bones and the liver similar to the prior, with suggestion of slight progression in the liver. Of note, many of the liver lesions do show areas of internal decreased enhancement, which could suggest developing areas of necrosis; New small volume of ascites; She is currently under  the Hospitalist service.patient is likely to undergo thoracentesis if complicated with fevers. We were kindly informed of the patient's admission.    Past medical history:      Past Medical History  Diagnosis Date  . Hypertension   . GERD (gastroesophageal reflux disease)   . 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 Pyelonephritis 10/01/11  . Recurrent UTI (urinary tract infection) 10/01/11  . S/P chemotherapy, time since 4-12 weeks   . S/P radiation > 12 weeks 03/15/12- 04/07/12    right hip, clivus  . 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 Brain cancer     spread from breast ca  . Hypercalcemia 06/21/2012  . History of radiation therapy 08/06/10 -09/16/10    R supraclav fossa, R chest wall, R drain site    Past surgical history:      Past Surgical History  Procedure Laterality Date  . Cesarean section  1989; 2010  . Mastectomy  2011    right  . Back surgery  2012    "removed tumor,cancer, from my spine  . Port-a-cath removal  2012    right chest  . Portacath placement  2011    right  . Tubal ligation  2010  . Breast biopsy  2011    right  . Hip arthroplasty  05/04/2012    Procedure: ARTHROPLASTY BIPOLAR HIP;  Surgeon: Andrea Pal, MD;  Location: [facility];  Service: Orthopedics;  Laterality: Right;  RIGHT HIP HEMI ARTHROPLASTY     Medications:  Prior to Admission:  Prescriptions prior to admission  Medication Sig Dispense Refill  . cyclobenzaprine (FLEXERIL) 10 MG tablet Take 10 mg by mouth 3 (three) times daily as needed for muscle spasms.      [name] dexamethasone (DECADRON) 4 MG tablet Take 2 tablets (8 mg total) by mouth every 12 (twelve) hours.  60 tablet  0  . docusate sodium (COLACE) 100 MG capsule Take 1 capsule (100 mg total) by mouth 2 (two) times daily as needed for constipation.  10 capsule  0  . exemestane (AROMASIN) 25 MG tablet Take 25 mg by mouth daily after breakfast.      . ondansetron (ZOFRAN) 4 MG tablet Take 4 mg by  mouth every 6 (six) hours.      . OxyCODONE HCl ER (OXYCONTIN) 60 MG T12A Take 1 tablet by mouth 2 (two) times daily.  60 each  0  . zolpidem (AMBIEN) 5 MG tablet Take 1 tablet (5 mg total) by mouth at bedtime as needed for sleep.  30 tablet  1  . everolimus (AFINITOR) 5 MG tablet Take 5 mg by mouth daily.      . ferrous sulfate 325 (65 FE) MG tablet Take 325 mg by mouth 3 (three) times daily after meals.      [name] oxyCODONE-acetaminophen (PERCOCET) 10-325 MG per tablet Take 1 tablet by mouth every 4 (four) hours as needed for pain.  120 tablet  0  . sennosides-docusate sodium (SENOKOT-S) 8.6-50 MG tablet Take 1 tablet by mouth daily as needed for constipation.  20 tablet  0    GNF:AOZHYQMVHQIONGE, HYDROmorphone (DILAUDID) injection, ondansetron (ZOFRAN) IV, ondansetron, oxyCODONE, oxyCODONE-acetaminophen, senna-docusate, sodium phosphate  Allergies: No Known Allergies  Family history:The patient's father is alive at age 79. The patient's mother is alive at age 55. She has four sisters. She had one brother who died from colon cancer at the age of 73. There is  no breast or ovarian cancer history in the family to her knowledge.      Social History: The patient is not employed. She lives by herself with her small son whose name is [name] (he is currently staying with his dad in [place]). Her first child, [name], is studying business in this area. The patient has one grandchild. She attends the [employer].  Patrica continues to be under quite a bit of stress at home. She's currently living alone, her daughter having recently moved out again. She is still trying to get moved from a 2 story apartment to a one level. She is ambulating slowly with a cane. She is a high fall risk. Former Smoker -- 0.25 packs/day for 31 years  quit11/26/2013 . No ETOH    Review of systems:   Unable to obtain, due to patient inability to verbally interact due to lethargy, although able to respond to voice and  touch. Per office report on 3/5, "Tracee denies any fevers or chills. She's had no skin changes, and denies any abnormal bleeding. Her appetite is reduced and she is having quite a bit of taste alteration. Everything tastes "to salty and metallic". She denies any actual nausea or emesis. She tends to be constipated but had a good bowel movement yesterday. Wilhelmenia has noticed an increased cough, typically nonproductive, along with increased shortness of breath. She denies chest pain or palpitations. She has occasional headaches. She continues on dexamethasone, 4 mg twice daily. She's had no significant peripheral swelling".    Physical exam:      Filed Vitals:   08/05/12 1020  BP: 146/79  Pulse: 122  Temp:   Resp: 30    Weight change:   General:  47 y.o. female  In significant discomfort. Eyes closed, but grimacing, pointing to the abdominal area. HEENT: Normocephalic, atraumatic, PERRLA. Oral cavity without thrush or lesions. Neck supple. no thyromegaly, no cervical or supraclavicular adenopathy  Lungs decreased breath sounds bilaterally No wheezing, rhonchi or rales. No axillary masses. Breasts: not performed. Cardiac regular rate and rhythm, no murmur , rubs or gallops Abdomen distended, tender diffusely to palpation, bowel sounds present.GU/rectal: deferred. Extremities no clubbing, no  cyanosis or edema. No bruising or petechial rash Musculoskeletal: could not palpate spine for patient lying supine, unable to move her. Neuro: Non Focal   Lab results:       CBC  Recent Labs Lab 08/03/12 1609 08/04/12 0915 08/05/12 0530  WBC 12.2* 7.7 6.9  HGB 9.2* 8.9* 8.0*  HCT 27.6* 26.2* 25.0*  PLT 362 319 272  MCV 78.7* 78.9 78.6  MCH 26.2 26.8 25.2*  MCHC 33.3 34.0 32.0  RDW 19.9* 18.8* 19.0*  LYMPHSABS 0.7* 0.3*  --   MONOABS 0.2 0.2  --   EOSABS 0.0 0.0  --   BASOSABS 0.0 0.0  --      Chemistries   Recent Labs Lab 08/03/12 1609 08/04/12 0915 08/05/12 0530  08/05/12 0535  NA 142 139 135  --   K 3.2* 3.2* 2.8*  --   CL 103 100 98  --   CO2 27 27 24   --   GLUCOSE 209* 110* 97  --   BUN 9.3 7 7   --   CREATININE 0.9 0.69 0.66  --   CALCIUM 9.4 8.8 8.1*  --   MG  --   --   --  1.5      Studies:      08/04/2012   CT ABDOMEN AND  PELVIS  Findings:  Abdomen/Pelvis: There are again innumerable hepatic nodules and masses, compatible with widespread metastatic disease, largest of which measures up to 10.8 x 9.8 cm in the right lobe of the liver (image 48 of series 2).  Overall, the size of these lesions appears very similar to the prior examination, if not slightly increased, but there is increasing central low attenuation within many of these lesions, suggesting internal areas of necrosis and vascular compromise.  Some lesions do appear slightly larger, including a small lesion in the right lobe of the liver on image 66 of series 2 which currently measures 13 mm (previously only 9 mm).  The appearance of the gallbladder, pancreas, spleen, bilateral adrenal glands in the right kidney is unremarkable.  Parenchymal thinning in the interpolar region of the left kidney, and a tiny 2 mm nonobstructive calculus in the interpolar left kidney are both unchanged.  A tiny volume of ascites in the anatomic pelvis is simple in appearance.  Uterus is heterogeneous in appearance with multifocal low attenuation and partially calcified lesions, likely represent fibroids.  Ovaries are unremarkable in appearance. Assessment of the anatomic pelvis is significantly limited by extensive beam hardening artifact from the patient's right sided hip prosthesis.  The urinary bladder is moderately distended without focal abnormalities.  No pneumoperitoneum.  No pathologic distension of small bowel.  No definite pathologic lymphadenopathy identified within the abdomen or pelvis.  Relatively large volume of well formed stool throughout the colon may suggest constipation.  Musculoskeletal:  Status post right total hip arthroplasty. Numerous mixed lytic and sclerotic lesions are again noted throughout the visualized axial and appendicular skeleton, similar in appearance to prior examinations.  IMPRESSION:  1.  Overall, the burden of metastatic disease in the bones and the liver is very similar to the prior examination, with suggestion of slight progression in the liver, as above.  Importantly, many of the liver lesions do show areas of internal decreased enhancement, which could suggest developing areas of necrosis. 2.  New small volume of ascites. 3.  Moderate volume of well formed stool throughout the colon may suggest constipation. 4.  Additional incidental findings, as above.   Original Report Authenticated By: Trudie Reed, M.D.    Ct Chest W Contrast  08/04/2012 Comparison:  Chest CT 04/20/2012.  CT of the abdomen and pelvis 07/07/2012.  CT CHEST  Findings:  Mediastinum: Heart size is normal. There is no significant pericardial fluid, thickening or pericardial calcification.  There is some prominent but not enlarged nodal tissue in the low right paratracheal station which appears to represent a cluster of small lymph nodes.  Several other small borderline enlarged lymph nodes are noted in the mediastinum, most notably in the subcarinal station.  No definite pathologic nodal enlargement is noted. Esophagus is unremarkable in appearance.  Lungs/Pleura: Evaluation of the lung parenchyma is limited by extensive patient respiratory motion.  With this limitation in mind there is new peribronchovascular ground-glass attenuation air space disease in the left upper lobe, and to a lesser extent in the inferior segments of the left lower lobe, concerning for multilobar pneumonia.  No definite suspicious appearing pulmonary nodules or masses are confidently identified.  Moderate bilateral pleural effusions layer dependently and are relatively simple in appearance, however, there are some areas of pleural  thickening and enhancement, best demonstrated on image 43 of series 2 in the posterior aspect of the right pleural cavity, suggesting that there may be malignant pleural involvement.  Musculoskeletal: Postoperative changes of modified radical  right sided mastectomy with right axillary clips compatible with prior nodal dissection.  Innumerable mixed lytic and sclerotic lesions are again noted throughout the visualized axial and appendicular skeleton, most pronounced throughout the thoracic spine.  There is a new nondisplaced pathologic fracture through the posterior aspect of the right eleventh rib.  Postoperative changes of laminectomy are noted at its T9-T10. Enhancing soft tissue in the epidural space in the left side of the upper thoracic spine in the right side of the lower thoracic spine, concerning for additional sites of metastatic disease.  No definite pathologic vertebral body compression fractures are identified at this time.  IMPRESSION:  1. Enlarging moderate bilateral pleural effusions, with some pleural thickening and enhancement, suggesting malignant pleural metastases.  Sampling of pleural fluid may be warranted if clinically indicated. 2. Progression of metastatic disease to the bones, particularly throughout the thoracic spine, with a new nondisplaced pathologic fracture of the posterior aspect of the right eleventh rib. Enhancing soft tissue in the epidural space adjacent to both the upper and lower thoracic spine is also noted, better demonstrated on recent MRI. 3.  New multifocal peribronchovascular air space disease throughout the left upper and lower lobe concerning for multilobar bronchopneumonia.    Mr Thoracic Spine W Wo Contrast  Comparison: MRI 03/21/2011.  CT chest 04/20/2012.  Findings: The patient has undergone previous T9-T11 thoracic laminectomy with epidural tumor resection.  The surgical area is well decompressed without residual or recurrent thoracic cord compression.  Since  the prior MR, there has developed widespread osseous disease. Post XRT changes are noted in the marrow admixed with areas of tumor; some lesions are sclerotic, for instance T9, T10, and T11.  There is epidural tumor which is compressing the thoracic cord from T4-T7.  This epidural tumor is greater on the left and is associated with a moderately large left T5 paravertebral mass (image 15 series 12); the cord is squeezed from side-to-side although the epidural tumor is greater on the left.  The cord is also displaced from left to right.  There is slight increased cord signal at the T4 and T5 levels.  Slight epidural tumor at T10 and T1 is non compressive.  IMPRESSION: Widespread osseous metastatic disease as described.  Interval development since 2012 of new epidural tumor extending from T4-T7 which compresses the thoracic cord and displaces it from left to right as described above.  Moderate left paravertebral mass maximal at T5.  Satisfactory appearance status post thoracic laminectomy and decompression T9-T11.  Findings discussed with Dr. Lynelle Doctor shortly after completion of the study.   Original Report Authenticated By: Davonna Belling, M.D.    Assessmnent/Plan as per Dr.Ha  Marlowe Kays E, PA-C 08/05/2012   ==================================  ATTENDING'S ATTESTATION:  I personally reviewed patient's chart, examined patient myself, formulated the treatment plan as followed.      PROBLEM LIST:  1.  Metastatic breast cancer; ER+/PR+/Her2neu negative:  Most recent therapy with Examestane and Everolimus since Dec 2013; now with slight disease progression.  2.  Liver met with abdominal pain.  3.  Bone met with back pain.  4.  Multilobar pneumonia with positive Influenza A.  5.  Pain control issue.   IMPRESSION:  3 months of Examestane and Everolimus maybe too early to declare failure to current therapy.  However, she has high visceral disease burden. Y90 treatment maybe an option for her symptomatic  liver met.  However, there maybe a risk of hepatic failure given the burden of the tumor in her liver.  In my opinion, she may be a candidate for systemic chemo if she wants to continue with therapy.  She did not want me to discuss with her her disease status and options of treatment.  She wanted to hear this from Dr. Darnelle Catalan. In order to help her cope with the diagnosis and make appropriate decision, I recommended consultation with inpatient Palliative care.  However, she refused this as well.    RECOMMENDATION:  - Continue empiric antibiotics Vancomycin/Cefepime/Levaquin for bacterial pneumonia and Tamiflu for influenza A.  - Continue with pain control regimen. - Follow up with Dr. Darnelle Catalan when he returns to clinic next week.

## 2012-08-05 NOTE — Progress Notes (Signed)
Notified Dr Michell Heinrich of pt's inpt status, ICU rm [id number]. Per Dr Michell Heinrich, pt's radiation tx on hold today. Eileen Stanford, RT linac #2 notified.

## 2012-08-06 DIAGNOSIS — I10 Essential (primary) hypertension: Secondary | ICD-10-CM

## 2012-08-06 DIAGNOSIS — R000 Tachycardia, unspecified: Secondary | ICD-10-CM

## 2012-08-06 LAB — CBC
HCT: 24.6 % — ABNORMAL LOW (ref 36.0–46.0)
Hemoglobin: 8.1 g/dL — ABNORMAL LOW (ref 12.0–15.0)
MCH: 25.9 pg — ABNORMAL LOW (ref 26.0–34.0)
MCHC: 32.9 g/dL (ref 30.0–36.0)
RDW: 19.3 % — ABNORMAL HIGH (ref 11.5–15.5)

## 2012-08-06 LAB — BASIC METABOLIC PANEL
BUN: 6 mg/dL (ref 6–23)
Creatinine, Ser: 0.63 mg/dL (ref 0.50–1.10)
GFR calc Af Amer: >90 mL/min (ref >90)
GFR calc non Af Amer: >90 mL/min (ref >90)
Glucose, Bld: 160 mg/dL — ABNORMAL HIGH (ref 70–99)
Potassium: 4.2 mEq/L (ref 3.5–5.1)

## 2012-08-06 MED ORDER — VITAMINS A & D EX OINT
TOPICAL_OINTMENT | CUTANEOUS | Status: AC
  Administered 2012-08-06: 5
  Filled 2012-08-06: qty 5

## 2012-08-06 MED ORDER — BIOTENE DRY MOUTH MT LIQD
15.0000 mL | Freq: Two times a day (BID) | OROMUCOSAL | Status: DC
  Administered 2012-08-06 – 2012-08-10 (×9): 15 mL via OROMUCOSAL

## 2012-08-06 NOTE — Progress Notes (Signed)
[facility] PROGRESS NOTE  [name] WJX:[id number] DOB: 26-Aug-1965 DOA: 08/04/2012 PCP: No primary provider on file.  Assessment/Plan: Principal Problem:  Abdominal pain & Back pain and New 11th rib pathologic fracture  -A discussed above possibly secondary to metastatic disease- slight progression of liver disease with areas suggesting developing areas of necrosis noted and new 11th rib pathologic fracture noted as above  -UA is neg for UTI,  Lipase nl, CEs neg - better pain control, continue current pain management - IV dilaudid prn, and continue outpt scheduled oxycontin and prn percocet  Active Problems: Influenza A. -Will start Tamiflu HCAP (healthcare-associated pneumonia)  -Continue current empiric antibiotics with Vanc, Levaquin,and cefepime  -Urinary Legionella and strep pneumo antigen is negative. -blood cultures NGTD, follow and deescalate when appropriate. 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  -followed by Dr Darnelle Catalan, have consulted oncology for further recommendations.  Bil Pleural effusions  - Possibly secondary to infection vs malignancy  -abx as above  - fevers likely secondary to influenza>>trending down on Tamiflu -Await oncology input. Anemia  -secondary to ca  - hgb stable today, no gross bleeding  Hypokalemia  -s/p repletion, resolved Tachycardia  -secondary to pain & infection  -improved with above traetment, cardiac enzymes negative. Constipation  -likely secondary to narcotics  -continue current bowel regimen- laxatives,enemas and follow   Code Status: DNR Family Communication: Pt at bedside Disposition Plan: pending clinical course   Consultants:  Oncology- eval pending  Procedures:  none  Antibiotics:  Levaquin, cefepime, and vancomycin started on 3/6  Tamiflu was started on 3/7  HPI/Subjective: Sitting up in chair, states decreased pain and tolerating regular diet  Objective: Filed Vitals:   08/06/12 0300 08/06/12  0400 08/06/12 0552 08/06/12 0800  BP: 118/63  101/55 105/57  Pulse: 112  103 108  Temp:  100.4 F (38 C)    TempSrc:  Axillary    Resp: 23  24 22   Height:      Weight:  59.3 kg (130 lb 11.7 oz)    SpO2: 93%  92% 95%    Intake/Output Summary (Last 24 hours) at 08/06/12 0857 Last data filed at 08/06/12 0801  Gross per 24 hour  Intake   2440 ml  Output   4075 ml  Net  -1635 ml   Filed Weights   08/04/12 1800 08/06/12 0400  Weight: 60.7 kg (133 lb 13.1 oz) 59.3 kg (130 lb 11.7 oz)    Exam:   General:   in no respiratory distress   Cardiovascular: mild Tachycardia, regular normal S1-S2.   Respiratory: decreased breath sounds in the lower lung fields.  Abdomen: Mildly distended, bowel sounds present, decreased tenderness    extremities: No cyanosis and no edema  Data Reviewed: Basic Metabolic Panel:  Recent Labs Lab 08/03/12 1609 08/04/12 0915 08/05/12 0530 08/05/12 0535 08/06/12 0354  NA 142 139 135  --  133*  K 3.2* 3.2* 2.8*  --  4.2  CL 103 100 98  --  100  CO2 27 27 24   --  24  GLUCOSE 209* 110* 97  --  160*  BUN 9.3 7 7   --  6  CREATININE 0.9 0.69 0.66  --  0.63  CALCIUM 9.4 8.8 8.1*  --  8.5  MG  --   --   --  1.5  --    Liver Function Tests:  Recent Labs Lab 08/03/12 1609 08/04/12 0915 08/05/12 0530  AST 39* 38* 41*  ALT 19 17 13   ALKPHOS  293* 256* 224*  BILITOT 0.45 0.5 0.5  PROT 6.9 6.5 5.8*  ALBUMIN 3.1* 3.1* 2.6*    Recent Labs Lab 08/04/12 1840  LIPASE 7*   No results found for this basename: AMMONIA,  in the last 168 hours CBC:  Recent Labs Lab 08/03/12 1609 08/04/12 0915 08/05/12 0530  WBC 12.2* 7.7 6.9  NEUTROABS 11.3* 7.2  --   HGB 9.2* 8.9* 8.0*  HCT 27.6* 26.2* 25.0*  MCV 78.7* 78.9 78.6  PLT 362 319 272   Cardiac Enzymes:  Recent Labs Lab 08/04/12 1840 08/04/12 2320 08/05/12 0530  TROPONINI <0.30 <0.30 <0.30   BNP (last 3 results) No results found for this basename: PROBNP,  in the last 8760  hours CBG: No results found for this basename: GLUCAP,  in the last 168 hours  Recent Results (from the past 240 hour(s))  CULTURE, BLOOD (ROUTINE X 2)     Status: None   Collection Time    08/04/12  1:55 PM      Result Value Range Status   Specimen Description BLOOD LEFT ARM   Final   Special Requests BOTTLES DRAWN AEROBIC AND ANAEROBIC   Final   Culture  Setup Time 08/04/2012 22:18   Final   Culture     Final   Value:        BLOOD CULTURE RECEIVED NO GROWTH TO DATE CULTURE WILL BE HELD FOR 5 DAYS BEFORE ISSUING A FINAL NEGATIVE REPORT   Report Status PENDING   Incomplete  CULTURE, BLOOD (ROUTINE X 2)     Status: None   Collection Time    08/04/12  2:00 PM      Result Value Range Status   Specimen Description BLOOD LEFT HAND   Final   Special Requests BOTTLES DRAWN AEROBIC AND ANAEROBIC   Final   Culture  Setup Time 08/04/2012 22:18   Final   Culture     Final   Value:        BLOOD CULTURE RECEIVED NO GROWTH TO DATE CULTURE WILL BE HELD FOR 5 DAYS BEFORE ISSUING A FINAL NEGATIVE REPORT   Report Status PENDING   Incomplete  MRSA PCR SCREENING     Status: None   Collection Time    08/04/12  5:15 PM      Result Value Range Status   MRSA by PCR NEGATIVE  NEGATIVE Final   Comment:            The GeneXpert MRSA Assay (FDA     approved for NASAL specimens     only), is one component of a     comprehensive MRSA colonization     surveillance program. It is not     intended to diagnose MRSA     infection nor to guide or     monitor treatment for     MRSA infections.     Studies: Ct Chest W Contrast  08/04/2012  *RADIOLOGY REPORT*  Clinical Data:  Abdominal pain.  Metastatic breast cancer.  CT CHEST, ABDOMEN AND PELVIS WITH CONTRAST  Technique:  Multidetector CT imaging of the chest, abdomen and pelvis was performed following the standard protocol during bolus administration of intravenous contrast.  Contrast: OMNIPAQUE IOHEXOL 300 MG/ML  SOLN,  Comparison:  Chest CT  04/20/2012.  CT of the abdomen and pelvis 07/07/2012.  CT CHEST  Findings:  Mediastinum: Heart size is normal. There is no significant pericardial fluid, thickening or pericardial calcification.  There is some prominent  but not enlarged nodal tissue in the low right paratracheal station which appears to represent a cluster of small lymph nodes.  Several other small borderline enlarged lymph nodes are noted in the mediastinum, most notably in the subcarinal station.  No definite pathologic nodal enlargement is noted. Esophagus is unremarkable in appearance.  Lungs/Pleura: Evaluation of the lung parenchyma is limited by extensive patient respiratory motion.  With this limitation in mind there is new peribronchovascular ground-glass attenuation air space disease in the left upper lobe, and to a lesser extent in the inferior segments of the left lower lobe, concerning for multilobar pneumonia.  No definite suspicious appearing pulmonary nodules or masses are confidently identified.  Moderate bilateral pleural effusions layer dependently and are relatively simple in appearance, however, there are some areas of pleural thickening and enhancement, best demonstrated on image 43 of series 2 in the posterior aspect of the right pleural cavity, suggesting that there may be malignant pleural involvement.  Musculoskeletal: Postoperative changes of modified radical right sided mastectomy with right axillary clips compatible with prior nodal dissection.  Innumerable mixed lytic and sclerotic lesions are again noted throughout the visualized axial and appendicular skeleton, most pronounced throughout the thoracic spine.  There is a new nondisplaced pathologic fracture through the posterior aspect of the right eleventh rib.  Postoperative changes of laminectomy are noted at its T9-T10. Enhancing soft tissue in the epidural space in the left side of the upper thoracic spine in the right side of the lower thoracic spine, concerning  for additional sites of metastatic disease.  No definite pathologic vertebral body compression fractures are identified at this time.  IMPRESSION:  1. Enlarging moderate bilateral pleural effusions, with some pleural thickening and enhancement, suggesting malignant pleural metastases.  Sampling of pleural fluid may be warranted if clinically indicated. 2. Progression of metastatic disease to the bones, particularly throughout the thoracic spine, with a new nondisplaced pathologic fracture of the posterior aspect of the right eleventh rib. Enhancing soft tissue in the epidural space adjacent to both the upper and lower thoracic spine is also noted, better demonstrated on recent MRI. 3.  New multifocal peribronchovascular air space disease throughout the left upper and lower lobe concerning for multilobar bronchopneumonia.  CT ABDOMEN AND PELVIS  Findings:  Abdomen/Pelvis: There are again innumerable hepatic nodules and masses, compatible with widespread metastatic disease, largest of which measures up to 10.8 x 9.8 cm in the right lobe of the liver (image 48 of series 2).  Overall, the size of these lesions appears very similar to the prior examination, if not slightly increased, but there is increasing central low attenuation within many of these lesions, suggesting internal areas of necrosis and vascular compromise.  Some lesions do appear slightly larger, including a small lesion in the right lobe of the liver on image 66 of series 2 which currently measures 13 mm (previously only 9 mm).  The appearance of the gallbladder, pancreas, spleen, bilateral adrenal glands in the right kidney is unremarkable.  Parenchymal thinning in the interpolar region of the left kidney, and a tiny 2 mm nonobstructive calculus in the interpolar left kidney are both unchanged.  A tiny volume of ascites in the anatomic pelvis is simple in appearance.  Uterus is heterogeneous in appearance with multifocal low attenuation and partially  calcified lesions, likely represent fibroids.  Ovaries are unremarkable in appearance. Assessment of the anatomic pelvis is significantly limited by extensive beam hardening artifact from the patient's right sided hip prosthesis.  The urinary  bladder is moderately distended without focal abnormalities.  No pneumoperitoneum.  No pathologic distension of small bowel.  No definite pathologic lymphadenopathy identified within the abdomen or pelvis.  Relatively large volume of well formed stool throughout the colon may suggest constipation.  Musculoskeletal: Status post right total hip arthroplasty. Numerous mixed lytic and sclerotic lesions are again noted throughout the visualized axial and appendicular skeleton, similar in appearance to prior examinations.  IMPRESSION:  1.  Overall, the burden of metastatic disease in the bones and the liver is very similar to the prior examination, with suggestion of slight progression in the liver, as above.  Importantly, many of the liver lesions do show areas of internal decreased enhancement, which could suggest developing areas of necrosis. 2.  New small volume of ascites. 3.  Moderate volume of well formed stool throughout the colon may suggest constipation. 4.  Additional incidental findings, as above.   Original Report Authenticated By: Trudie Reed, M.D.    Ct Chest W Contrast  08/04/2012  *RADIOLOGY REPORT*  Clinical Data:  Abdominal pain.  Metastatic breast cancer.  CT CHEST, ABDOMEN AND PELVIS WITH CONTRAST  Technique:  Multidetector CT imaging of the chest, abdomen and pelvis was performed following the standard protocol during bolus administration of intravenous contrast.  Contrast: OMNIPAQUE IOHEXOL 300 MG/ML  SOLN,  Comparison:  Chest CT 04/20/2012.  CT of the abdomen and pelvis 07/07/2012.  CT CHEST  Findings:  Mediastinum: Heart size is normal. There is no significant pericardial fluid, thickening or pericardial calcification.  There is some prominent but  not enlarged nodal tissue in the low right paratracheal station which appears to represent a cluster of small lymph nodes.  Several other small borderline enlarged lymph nodes are noted in the mediastinum, most notably in the subcarinal station.  No definite pathologic nodal enlargement is noted. Esophagus is unremarkable in appearance.  Lungs/Pleura: Evaluation of the lung parenchyma is limited by extensive patient respiratory motion.  With this limitation in mind there is new peribronchovascular ground-glass attenuation air space disease in the left upper lobe, and to a lesser extent in the inferior segments of the left lower lobe, concerning for multilobar pneumonia.  No definite suspicious appearing pulmonary nodules or masses are confidently identified.  Moderate bilateral pleural effusions layer dependently and are relatively simple in appearance, however, there are some areas of pleural thickening and enhancement, best demonstrated on image 43 of series 2 in the posterior aspect of the right pleural cavity, suggesting that there may be malignant pleural involvement.  Musculoskeletal: Postoperative changes of modified radical right sided mastectomy with right axillary clips compatible with prior nodal dissection.  Innumerable mixed lytic and sclerotic lesions are again noted throughout the visualized axial and appendicular skeleton, most pronounced throughout the thoracic spine.  There is a new nondisplaced pathologic fracture through the posterior aspect of the right eleventh rib.  Postoperative changes of laminectomy are noted at its T9-T10. Enhancing soft tissue in the epidural space in the left side of the upper thoracic spine in the right side of the lower thoracic spine, concerning for additional sites of metastatic disease.  No definite pathologic vertebral body compression fractures are identified at this time.  IMPRESSION:  1. Enlarging moderate bilateral pleural effusions, with some pleural  thickening and enhancement, suggesting malignant pleural metastases.  Sampling of pleural fluid may be warranted if clinically indicated. 2. Progression of metastatic disease to the bones, particularly throughout the thoracic spine, with a new nondisplaced pathologic fracture of the posterior  aspect of the right eleventh rib. Enhancing soft tissue in the epidural space adjacent to both the upper and lower thoracic spine is also noted, better demonstrated on recent MRI. 3.  New multifocal peribronchovascular air space disease throughout the left upper and lower lobe concerning for multilobar bronchopneumonia.  CT ABDOMEN AND PELVIS  Findings:  Abdomen/Pelvis: There are again innumerable hepatic nodules and masses, compatible with widespread metastatic disease, largest of which measures up to 10.8 x 9.8 cm in the right lobe of the liver (image 48 of series 2).  Overall, the size of these lesions appears very similar to the prior examination, if not slightly increased, but there is increasing central low attenuation within many of these lesions, suggesting internal areas of necrosis and vascular compromise.  Some lesions do appear slightly larger, including a small lesion in the right lobe of the liver on image 66 of series 2 which currently measures 13 mm (previously only 9 mm).  The appearance of the gallbladder, pancreas, spleen, bilateral adrenal glands in the right kidney is unremarkable.  Parenchymal thinning in the interpolar region of the left kidney, and a tiny 2 mm nonobstructive calculus in the interpolar left kidney are both unchanged.  A tiny volume of ascites in the anatomic pelvis is simple in appearance.  Uterus is heterogeneous in appearance with multifocal low attenuation and partially calcified lesions, likely represent fibroids.  Ovaries are unremarkable in appearance. Assessment of the anatomic pelvis is significantly limited by extensive beam hardening artifact from the patient's right sided hip  prosthesis.  The urinary bladder is moderately distended without focal abnormalities.  No pneumoperitoneum.  No pathologic distension of small bowel.  No definite pathologic lymphadenopathy identified within the abdomen or pelvis.  Relatively large volume of well formed stool throughout the colon may suggest constipation.  Musculoskeletal: Status post right total hip arthroplasty. Numerous mixed lytic and sclerotic lesions are again noted throughout the visualized axial and appendicular skeleton, similar in appearance to prior examinations.  IMPRESSION:  1.  Overall, the burden of metastatic disease in the bones and the liver is very similar to the prior examination, with suggestion of slight progression in the liver, as above.  Importantly, many of the liver lesions do show areas of internal decreased enhancement, which could suggest developing areas of necrosis. 2.  New small volume of ascites. 3.  Moderate volume of well formed stool throughout the colon may suggest constipation. 4.  Additional incidental findings, as above.   Original Report Authenticated By: Trudie Reed, M.D.    Ct Abdomen Pelvis W Contrast  08/04/2012  *RADIOLOGY REPORT*  Clinical Data:  Abdominal pain.  Metastatic breast cancer.  CT CHEST, ABDOMEN AND PELVIS WITH CONTRAST  Technique:  Multidetector CT imaging of the chest, abdomen and pelvis was performed following the standard protocol during bolus administration of intravenous contrast.  Contrast: OMNIPAQUE IOHEXOL 300 MG/ML  SOLN,  Comparison:  Chest CT 04/20/2012.  CT of the abdomen and pelvis 07/07/2012.  CT CHEST  Findings:  Mediastinum: Heart size is normal. There is no significant pericardial fluid, thickening or pericardial calcification.  There is some prominent but not enlarged nodal tissue in the low right paratracheal station which appears to represent a cluster of small lymph nodes.  Several other small borderline enlarged lymph nodes are noted in the mediastinum,  most notably in the subcarinal station.  No definite pathologic nodal enlargement is noted. Esophagus is unremarkable in appearance.  Lungs/Pleura: Evaluation of the lung parenchyma is limited by  extensive patient respiratory motion.  With this limitation in mind there is new peribronchovascular ground-glass attenuation air space disease in the left upper lobe, and to a lesser extent in the inferior segments of the left lower lobe, concerning for multilobar pneumonia.  No definite suspicious appearing pulmonary nodules or masses are confidently identified.  Moderate bilateral pleural effusions layer dependently and are relatively simple in appearance, however, there are some areas of pleural thickening and enhancement, best demonstrated on image 43 of series 2 in the posterior aspect of the right pleural cavity, suggesting that there may be malignant pleural involvement.  Musculoskeletal: Postoperative changes of modified radical right sided mastectomy with right axillary clips compatible with prior nodal dissection.  Innumerable mixed lytic and sclerotic lesions are again noted throughout the visualized axial and appendicular skeleton, most pronounced throughout the thoracic spine.  There is a new nondisplaced pathologic fracture through the posterior aspect of the right eleventh rib.  Postoperative changes of laminectomy are noted at its T9-T10. Enhancing soft tissue in the epidural space in the left side of the upper thoracic spine in the right side of the lower thoracic spine, concerning for additional sites of metastatic disease.  No definite pathologic vertebral body compression fractures are identified at this time.  IMPRESSION:  1. Enlarging moderate bilateral pleural effusions, with some pleural thickening and enhancement, suggesting malignant pleural metastases.  Sampling of pleural fluid may be warranted if clinically indicated. 2. Progression of metastatic disease to the bones, particularly throughout  the thoracic spine, with a new nondisplaced pathologic fracture of the posterior aspect of the right eleventh rib. Enhancing soft tissue in the epidural space adjacent to both the upper and lower thoracic spine is also noted, better demonstrated on recent MRI. 3.  New multifocal peribronchovascular air space disease throughout the left upper and lower lobe concerning for multilobar bronchopneumonia.  CT ABDOMEN AND PELVIS  Findings:  Abdomen/Pelvis: There are again innumerable hepatic nodules and masses, compatible with widespread metastatic disease, largest of which measures up to 10.8 x 9.8 cm in the right lobe of the liver (image 48 of series 2).  Overall, the size of these lesions appears very similar to the prior examination, if not slightly increased, but there is increasing central low attenuation within many of these lesions, suggesting internal areas of necrosis and vascular compromise.  Some lesions do appear slightly larger, including a small lesion in the right lobe of the liver on image 66 of series 2 which currently measures 13 mm (previously only 9 mm).  The appearance of the gallbladder, pancreas, spleen, bilateral adrenal glands in the right kidney is unremarkable.  Parenchymal thinning in the interpolar region of the left kidney, and a tiny 2 mm nonobstructive calculus in the interpolar left kidney are both unchanged.  A tiny volume of ascites in the anatomic pelvis is simple in appearance.  Uterus is heterogeneous in appearance with multifocal low attenuation and partially calcified lesions, likely represent fibroids.  Ovaries are unremarkable in appearance. Assessment of the anatomic pelvis is significantly limited by extensive beam hardening artifact from the patient's right sided hip prosthesis.  The urinary bladder is moderately distended without focal abnormalities.  No pneumoperitoneum.  No pathologic distension of small bowel.  No definite pathologic lymphadenopathy identified within the  abdomen or pelvis.  Relatively large volume of well formed stool throughout the colon may suggest constipation.  Musculoskeletal: Status post right total hip arthroplasty. Numerous mixed lytic and sclerotic lesions are again noted throughout the visualized axial  and appendicular skeleton, similar in appearance to prior examinations.  IMPRESSION:  1.  Overall, the burden of metastatic disease in the bones and the liver is very similar to the prior examination, with suggestion of slight progression in the liver, as above.  Importantly, many of the liver lesions do show areas of internal decreased enhancement, which could suggest developing areas of necrosis. 2.  New small volume of ascites. 3.  Moderate volume of well formed stool throughout the colon may suggest constipation. 4.  Additional incidental findings, as above.   Original Report Authenticated By: Trudie Reed, M.D.     Scheduled Meds: . ALPRAZolam  0.25 mg Oral BID  . antiseptic oral rinse  15 mL Mouth Rinse BID  . ceFEPime (MAXIPIME) IV  1 g Intravenous Q8H  . docusate sodium  100 mg Oral BID  . enoxaparin (LOVENOX) injection  40 mg Subcutaneous Q24H  . feeding supplement  237 mL Oral BID BM  . ferrous sulfate  325 mg Oral TID PC  . guaiFENesin  600 mg Oral BID  . levofloxacin (LEVAQUIN) IV  750 mg Intravenous Q24H  . oseltamivir  75 mg Oral BID  . OxyCODONE  60 mg Oral Q12H  . pantoprazole (PROTONIX) IV  40 mg Intravenous Q breakfast  . vancomycin  1,000 mg Intravenous Q12H   Continuous Infusions: . 0.9 % NaCl with KCl 20 mEq / L 75 mL/hr at 08/05/12 2033    Principal Problem:   Abdominal pain Active Problems:   HCAP (healthcare-associated pneumonia)   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   Constipation   Back pain   Anemia   Hypokalemia   Tachycardia    Time spent: >35    Kela Millin  [facility] Pager [phone]. If 7PM-7AM, please contact night-coverage at [email or web address], password [facility] 08/06/2012, 8:57  AM  LOS: 2 days

## 2012-08-07 DIAGNOSIS — K219 Gastro-esophageal reflux disease without esophagitis: Secondary | ICD-10-CM

## 2012-08-07 DIAGNOSIS — B370 Candidal stomatitis: Secondary | ICD-10-CM

## 2012-08-07 MED ORDER — GI COCKTAIL ~~LOC~~
30.0000 mL | Freq: Two times a day (BID) | ORAL | Status: DC | PRN
  Administered 2012-08-08 – 2012-08-09 (×2): 30 mL via ORAL
  Filled 2012-08-07 (×2): qty 30

## 2012-08-07 MED ORDER — SUCRALFATE 1 GM/10ML PO SUSP
1.0000 g | Freq: Three times a day (TID) | ORAL | Status: DC
Start: 2012-08-07 — End: 2012-08-10
  Administered 2012-08-07 – 2012-08-10 (×11): 1 g via ORAL
  Filled 2012-08-07 (×16): qty 10

## 2012-08-07 MED ORDER — FLUCONAZOLE 200 MG PO TABS
200.0000 mg | ORAL_TABLET | Freq: Once | ORAL | Status: AC
  Administered 2012-08-07: 200 mg via ORAL
  Filled 2012-08-07: qty 1

## 2012-08-07 MED ORDER — FLUCONAZOLE 100 MG PO TABS
100.0000 mg | ORAL_TABLET | Freq: Every day | ORAL | Status: DC
  Administered 2012-08-08 – 2012-08-10 (×3): 100 mg via ORAL
  Filled 2012-08-07 (×3): qty 1

## 2012-08-07 MED ORDER — PANTOPRAZOLE SODIUM 40 MG PO TBEC
40.0000 mg | DELAYED_RELEASE_TABLET | Freq: Every day | ORAL | Status: DC
  Administered 2012-08-08 – 2012-08-10 (×3): 40 mg via ORAL
  Filled 2012-08-07 (×3): qty 1

## 2012-08-07 MED ORDER — ACETAMINOPHEN 325 MG PO TABS
650.0000 mg | ORAL_TABLET | Freq: Once | ORAL | Status: AC
  Administered 2012-08-07: 650 mg via ORAL
  Filled 2012-08-07: qty 2

## 2012-08-07 NOTE — Progress Notes (Signed)
PHARMACIST - PHYSICIAN COMMUNICATION DR:   Donna Bernard CONCERNING: Protonix IV to Oral Route Change Policy  RECOMMENDATION: This patient is receiving Protonix by the intravenous route.  Based on criteria approved by the Pharmacy and Therapeutics Committee, it is being converted to the equivalent oral dose form(s).  DESCRIPTION: These criteria include:  The patient is not neutropenic and does not exhibit a GI malabsorption state  The patient is eating (either orally or via tube) and/or has been taking other orally administered medications for a least 24 hours  The patient is improving clinically   If you have questions about this conversion, please contact the Pharmacy Department  [x]   [phone] )  [facility] PharmD, [place] Pager [phone] 08/07/2012 1:52 PM

## 2012-08-07 NOTE — Progress Notes (Signed)
[facility] PROGRESS NOTE  [name] ZOX:[id number] DOB: 06/18/1965 DOA: 08/04/2012 PCP: No primary provider on file.  Assessment/Plan: Principal Problem:  Abdominal pain & Back pain and New 11th rib pathologic fracture  -A discussed above possibly secondary to metastatic disease- slight progression of liver disease with areas suggesting developing areas of necrosis noted and new 11th rib pathologic fracture noted as above  -UA is neg for UTI,  Lipase nl, CEs neg - continue current pain management - IV dilaudid prn, and continue outpt scheduled oxycontin and prn percocet  Active Problems: Influenza A. -continue Tamiflu HCAP (healthcare-associated pneumonia)  -Continue current empiric antibiotics with Vanc, Levaquin,and cefepime  -Urinary Legionella and strep pneumo antigen is negative. -blood cultures NGTD, follow and deescalate when appropriate. 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  -followed by Dr Darnelle Catalan, have consulted oncology for further recommendations.  Bil Pleural effusions  - Possibly secondary to infection vs malignancy  -abx as above  - fevers likely secondary to influenza>>trending down on Tamiflu Anemia  -secondary to ca  - follow and recheck, no gross bleeding  Hypokalemia  -s/p repletion, resolved Tachycardia  -secondary to pain & infection  -improved with above treatment, cardiac enzymes negative. Constipation  -likely secondary to narcotics  -continue current bowel regimen- laxatives,enemas and follow Oral Thrush -start on diflucan Dyspepsia/GERD vs radiation induced symptoms -continue PPI -add carafate, and prn GI cocktail, follow and further eval if not improving  Code Status: DNR Family Communication: Pt at bedside Disposition Plan: pending clinical course   Consultants:  Oncology  Procedures:  none  Antibiotics:  Levaquin, cefepime, and vancomycin started on 3/6  Tamiflu was started on 3/7  HPI/Subjective: C/o back pain and  also c/o increased indigestion  Objective: Filed Vitals:   08/06/12 0959 08/06/12 1207 08/06/12 2115 08/07/12 0545  BP:  99/56 101/54 116/57  Pulse: 106 95 107 100  Temp:  98.5 F (36.9 C) 99.1 F (37.3 C) 98.8 F (37.1 C)  TempSrc:   Oral Oral  Resp: 25 18 20 16   Height:      Weight:      SpO2: 93% 91% 95% 95%    Intake/Output Summary (Last 24 hours) at 08/07/12 1135 Last data filed at 08/07/12 0035  Gross per 24 hour  Intake 938.75 ml  Output    750 ml  Net 188.75 ml   Filed Weights   08/04/12 1800 08/06/12 0400  Weight: 60.7 kg (133 lb 13.1 oz) 59.3 kg (130 lb 11.7 oz)    Exam:   General:   in no respiratory distress   Mouth: tongue with whitish plaques  Cardiovascular: mild Tachycardia, regular normal S1-S2.   Respiratory: decreased breath sounds in the lower lung fields.  Abdomen: distended, bowel sounds present, decreased tenderness    extremities: No cyanosis and no edema  Data Reviewed: Basic Metabolic Panel:  Recent Labs Lab 08/03/12 1609 08/04/12 0915 08/05/12 0530 08/05/12 0535 08/06/12 0354  NA 142 139 135  --  133*  K 3.2* 3.2* 2.8*  --  4.2  CL 103 100 98  --  100  CO2 27 27 24   --  24  GLUCOSE 209* 110* 97  --  160*  BUN 9.3 7 7   --  6  CREATININE 0.9 0.69 0.66  --  0.63  CALCIUM 9.4 8.8 8.1*  --  8.5  MG  --   --   --  1.5  --    Liver Function Tests:  Recent Labs Lab 08/03/12 1609  08/04/12 0915 08/05/12 0530  AST 39* 38* 41*  ALT 19 17 13   ALKPHOS 293* 256* 224*  BILITOT 0.45 0.5 0.5  PROT 6.9 6.5 5.8*  ALBUMIN 3.1* 3.1* 2.6*    Recent Labs Lab 08/04/12 1840  LIPASE 7*   No results found for this basename: AMMONIA,  in the last 168 hours CBC:  Recent Labs Lab 08/03/12 1609 08/04/12 0915 08/05/12 0530 08/06/12 0345  WBC 12.2* 7.7 6.9 5.4  NEUTROABS 11.3* 7.2  --   --   HGB 9.2* 8.9* 8.0* 8.1*  HCT 27.6* 26.2* 25.0* 24.6*  MCV 78.7* 78.9 78.6 78.6  PLT 362 319 272 279   Cardiac Enzymes:  Recent  Labs Lab 08/04/12 1840 08/04/12 2320 08/05/12 0530  TROPONINI <0.30 <0.30 <0.30   BNP (last 3 results) No results found for this basename: PROBNP,  in the last 8760 hours CBG: No results found for this basename: GLUCAP,  in the last 168 hours  Recent Results (from the past 240 hour(s))  CULTURE, BLOOD (ROUTINE X 2)     Status: None   Collection Time    08/04/12  1:55 PM      Result Value Range Status   Specimen Description BLOOD LEFT ARM   Final   Special Requests BOTTLES DRAWN AEROBIC AND ANAEROBIC   Final   Culture  Setup Time 08/04/2012 22:18   Final   Culture     Final   Value:        BLOOD CULTURE RECEIVED NO GROWTH TO DATE CULTURE WILL BE HELD FOR 5 DAYS BEFORE ISSUING A FINAL NEGATIVE REPORT   Report Status PENDING   Incomplete  CULTURE, BLOOD (ROUTINE X 2)     Status: None   Collection Time    08/04/12  2:00 PM      Result Value Range Status   Specimen Description BLOOD LEFT HAND   Final   Special Requests BOTTLES DRAWN AEROBIC AND ANAEROBIC   Final   Culture  Setup Time 08/04/2012 22:18   Final   Culture     Final   Value:        BLOOD CULTURE RECEIVED NO GROWTH TO DATE CULTURE WILL BE HELD FOR 5 DAYS BEFORE ISSUING A FINAL NEGATIVE REPORT   Report Status PENDING   Incomplete  MRSA PCR SCREENING     Status: None   Collection Time    08/04/12  5:15 PM      Result Value Range Status   MRSA by PCR NEGATIVE  NEGATIVE Final   Comment:            The GeneXpert MRSA Assay (FDA     approved for NASAL specimens     only), is one component of a     comprehensive MRSA colonization     surveillance program. It is not     intended to diagnose MRSA     infection nor to guide or     monitor treatment for     MRSA infections.     Studies: No results found.  Scheduled Meds: . ALPRAZolam  0.25 mg Oral BID  . antiseptic oral rinse  15 mL Mouth Rinse BID  . ceFEPime (MAXIPIME) IV  1 g Intravenous Q8H  . docusate sodium  100 mg Oral BID  . enoxaparin (LOVENOX)  injection  40 mg Subcutaneous Q24H  . feeding supplement  237 mL Oral BID BM  . ferrous sulfate  325 mg Oral TID PC  .  guaiFENesin  600 mg Oral BID  . oseltamivir  75 mg Oral BID  . OxyCODONE  60 mg Oral Q12H  . pantoprazole (PROTONIX) IV  40 mg Intravenous Q breakfast  . sucralfate  1 g Oral TID WC & HS  . vancomycin  1,000 mg Intravenous Q12H   Continuous Infusions: . 0.9 % NaCl with KCl 20 mEq / L 75 mL/hr at 08/06/12 1226    Principal Problem:   Abdominal pain Active Problems:   HCAP (healthcare-associated pneumonia)   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   Constipation   Back pain   Anemia   Hypokalemia   Tachycardia    Time spent: >35    [facility]  [facility] Pager [phone]. If 7PM-7AM, please contact night-coverage at [email or web address], password [facility] 08/07/2012, 11:35 AM  LOS: 3 days

## 2012-08-08 ENCOUNTER — Ambulatory Visit: Payer: Medicaid Other

## 2012-08-08 LAB — BASIC METABOLIC PANEL
BUN: 6 mg/dL (ref 6–23)
Calcium: 8.9 mg/dL (ref 8.4–10.5)
GFR calc Af Amer: >90 mL/min (ref >90)
GFR calc non Af Amer: >90 mL/min (ref >90)
Glucose, Bld: 87 mg/dL (ref 70–99)
Potassium: 4.2 mEq/L (ref 3.5–5.1)
Sodium: 135 mEq/L (ref 135–145)

## 2012-08-08 LAB — GLUCOSE, CAPILLARY: Glucose-Capillary: 95 mg/dL (ref 70–99)

## 2012-08-08 LAB — CBC
MCH: 25.2 pg — ABNORMAL LOW (ref 26.0–34.0)
MCHC: 32.2 g/dL (ref 30.0–36.0)
Platelets: 275 10*3/uL (ref 150–400)

## 2012-08-08 MED ORDER — SODIUM CHLORIDE 0.9 % IV BOLUS (SEPSIS)
500.0000 mL | Freq: Once | INTRAVENOUS | Status: AC
  Administered 2012-08-08: 500 mL via INTRAVENOUS

## 2012-08-08 NOTE — Progress Notes (Addendum)
[facility] PROGRESS NOTE  [name] AOZ:[id number] DOB: [birth year] DOA: 08/04/2012 PCP: No primary provider on file.  Assessment/Plan: Principal Problem:  Abdominal pain & Back pain and New 11th rib pathologic fracture  -A discussed above possibly secondary to metastatic disease- slight progression of liver disease with areas suggesting developing areas of necrosis noted and new 11th rib pathologic fracture noted as above  -UA is neg for UTI,  Lipase nl, CEs neg - continue current pain management - IV dilaudid prn, and continue outpt scheduled oxycontin and prn percocet  Active Problems: Influenza A. -continue Tamiflu HCAP (healthcare-associated pneumonia)  -Continue current empiric antibiotics with Vanc, Levaquin,and cefepime  -Urinary Legionella and strep pneumo antigen is negative. -blood cultures NGTD, continue current abx as pt still febrile overnight, although likely secondary to flu. 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  -followed by Dr Darnelle Catalan, have consulted oncology for further recommendations.  Bil Pleural effusions  - Possibly secondary to infection vs malignancy  -abx as above  - fevers likely secondary to influenza>>continue Tamiflu Anemia  -secondary to ca  - follow and recheck, no gross bleeding  Hypokalemia  -s/p repletion, resolved Tachycardia  -secondary to pain & infection  -improved with above treatment, cardiac enzymes negative. Constipation  -likely secondary to narcotics  -continue current bowel regimen- laxatives,enemas and follow Oral Thrush -start on diflucan Dyspepsia/GERD vs radiation induced symptoms -continue PPI -continue carafate, and prn GI cocktail -offered GI consult, but pt declined for now stating she wants to give current meds more time  Code Status: DNR Family Communication: daughter at bedside Disposition Plan: pending clinical course   Consultants:  Oncology  Procedures:  none  Antibiotics:  Levaquin,  cefepime, and vancomycin started on 3/6  Tamiflu was started on 3/7  HPI/Subjective: +BM and also still with indigestion but states a little better, reports only small amts of po intake  Objective: Filed Vitals:   08/07/12 2116 08/08/12 0206 08/08/12 0504 08/08/12 1350  BP: 98/49 76/60 105/63 115/60  Pulse: 114 98 105 106  Temp: 101 F (38.3 C) 98 F (36.7 C) 99.1 F (37.3 C) 98.6 F (37 C)  TempSrc: Oral Oral Oral   Resp: 18 18 16 18   Height:      Weight:      SpO2: 91% 90% 91% 90%    Intake/Output Summary (Last 24 hours) at 08/08/12 1537 Last data filed at 08/08/12 1300  Gross per 24 hour  Intake    600 ml  Output    501 ml  Net     99 ml   Filed Weights   08/04/12 1800 08/06/12 0400  Weight: 60.7 kg (133 lb 13.1 oz) 59.3 kg (130 lb 11.7 oz)    Exam:   General:   in no respiratory distress   Cardiovascular: mild Tachycardia, regular normal S1-S2.   Respiratory: decreased breath sounds in the lower lung fields.  Abdomen: distended, bowel sounds present, decreased tenderness    extremities: No cyanosis and no edema  Data Reviewed: Basic Metabolic Panel:  Recent Labs Lab 08/03/12 1609 08/04/12 0915 08/05/12 0530 08/05/12 0535 08/06/12 0354 08/08/12 0401  NA 142 139 135  --  133* 135  K 3.2* 3.2* 2.8*  --  4.2 4.2  CL 103 100 98  --  100 101  CO2 27 27 24   --  24 25  GLUCOSE 209* 110* 97  --  160* 87  BUN 9.3 7 7   --  6 6  CREATININE 0.9 0.69 0.66  --  0.63 0.64  CALCIUM 9.4 8.8 8.1*  --  8.5 8.9  MG  --   --   --  1.5  --   --    Liver Function Tests:  Recent Labs Lab 08/03/12 1609 08/04/12 0915 08/05/12 0530  AST 39* 38* 41*  ALT 19 17 13   ALKPHOS 293* 256* 224*  BILITOT 0.45 0.5 0.5  PROT 6.9 6.5 5.8*  ALBUMIN 3.1* 3.1* 2.6*    Recent Labs Lab 08/04/12 1840  LIPASE 7*   No results found for this basename: AMMONIA,  in the last 168 hours CBC:  Recent Labs Lab 08/03/12 1609 08/04/12 0915 08/05/12 0530 08/06/12 0345  08/08/12 0401  WBC 12.2* 7.7 6.9 5.4 3.8*  NEUTROABS 11.3* 7.2  --   --   --   HGB 9.2* 8.9* 8.0* 8.1* 8.5*  HCT 27.6* 26.2* 25.0* 24.6* 26.4*  MCV 78.7* 78.9 78.6 78.6 78.3  PLT 362 319 272 279 275   Cardiac Enzymes:  Recent Labs Lab 08/04/12 1840 08/04/12 2320 08/05/12 0530  TROPONINI <0.30 <0.30 <0.30   BNP (last 3 results) No results found for this basename: PROBNP,  in the last 8760 hours CBG: No results found for this basename: GLUCAP,  in the last 168 hours  Recent Results (from the past 240 hour(s))  CULTURE, BLOOD (ROUTINE X 2)     Status: None   Collection Time    08/04/12  1:55 PM      Result Value Range Status   Specimen Description BLOOD LEFT ARM   Final   Special Requests BOTTLES DRAWN AEROBIC AND ANAEROBIC   Final   Culture  Setup Time 08/04/2012 22:18   Final   Culture     Final   Value:        BLOOD CULTURE RECEIVED NO GROWTH TO DATE CULTURE WILL BE HELD FOR 5 DAYS BEFORE ISSUING A FINAL NEGATIVE REPORT   Report Status PENDING   Incomplete  CULTURE, BLOOD (ROUTINE X 2)     Status: None   Collection Time    08/04/12  2:00 PM      Result Value Range Status   Specimen Description BLOOD LEFT HAND   Final   Special Requests BOTTLES DRAWN AEROBIC AND ANAEROBIC   Final   Culture  Setup Time 08/04/2012 22:18   Final   Culture     Final   Value:        BLOOD CULTURE RECEIVED NO GROWTH TO DATE CULTURE WILL BE HELD FOR 5 DAYS BEFORE ISSUING A FINAL NEGATIVE REPORT   Report Status PENDING   Incomplete  MRSA PCR SCREENING     Status: None   Collection Time    08/04/12  5:15 PM      Result Value Range Status   MRSA by PCR NEGATIVE  NEGATIVE Final   Comment:            The GeneXpert MRSA Assay (FDA     approved for NASAL specimens     only), is one component of a     comprehensive MRSA colonization     surveillance program. It is not     intended to diagnose MRSA     infection nor to guide or     monitor treatment for     MRSA infections.      Studies: No results found.  Scheduled Meds: . ALPRAZolam  0.25 mg Oral BID  . antiseptic oral rinse  15 mL Mouth Rinse  BID  . ceFEPime (MAXIPIME) IV  1 g Intravenous Q8H  . docusate sodium  100 mg Oral BID  . enoxaparin (LOVENOX) injection  40 mg Subcutaneous Q24H  . feeding supplement  237 mL Oral BID BM  . ferrous sulfate  325 mg Oral TID PC  . fluconazole  100 mg Oral Daily  . guaiFENesin  600 mg Oral BID  . oseltamivir  75 mg Oral BID  . OxyCODONE  60 mg Oral Q12H  . pantoprazole  40 mg Oral Daily  . sucralfate  1 g Oral TID WC & HS  . vancomycin  1,000 mg Intravenous Q12H   Continuous Infusions: . 0.9 % NaCl with KCl 20 mEq / L 30 mL/hr at 08/07/12 2218    Principal Problem:   Abdominal pain Active Problems:   HCAP (healthcare-associated pneumonia)   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   Constipation   Back pain   Anemia   Hypokalemia   Tachycardia    Time spent: >35    Kela Millin  [facility] Pager [phone]. If 7PM-7AM, please contact night-coverage at [email or web address], password [facility] 08/08/2012, 3:37 PM  LOS: 4 days

## 2012-08-09 ENCOUNTER — Ambulatory Visit
Admission: RE | Admit: 2012-08-09 | Discharge: 2012-08-09 | Disposition: A | Discharge status: Home / Self Care | Payer: Medicaid Other | Source: Ambulatory Visit | Attending: Radiation Oncology | Admitting: Radiation Oncology

## 2012-08-09 ENCOUNTER — Encounter: Payer: Self-pay | Admitting: Radiation Oncology

## 2012-08-09 ENCOUNTER — Ambulatory Visit: Payer: Medicaid Other

## 2012-08-09 ENCOUNTER — Ambulatory Visit
Admit: 2012-08-09 | Discharge: 2012-08-09 | Disposition: A | Discharge status: Home / Self Care | Payer: Medicaid Other | Attending: Radiation Oncology | Admitting: Radiation Oncology

## 2012-08-09 ENCOUNTER — Telehealth: Payer: Self-pay | Admitting: *Deleted

## 2012-08-09 VITALS — BP 98/52 | HR 96 | Temp 98.5°F | Resp 20 | Wt 129.3 lb

## 2012-08-09 DIAGNOSIS — C50519 Malignant neoplasm of lower-outer quadrant of unspecified female breast: Secondary | ICD-10-CM

## 2012-08-09 DIAGNOSIS — C7951 Secondary malignant neoplasm of bone: Secondary | ICD-10-CM

## 2012-08-09 DIAGNOSIS — J111 Influenza due to unidentified influenza virus with other respiratory manifestations: Secondary | ICD-10-CM

## 2012-08-09 MED ORDER — EVEROLIMUS 10 MG PO TABS: 10.0000 mg | ORAL_TABLET | Freq: Every day | ORAL | Status: DC

## 2012-08-09 MED ORDER — EVEROLIMUS 10 MG PO TABS
10.0000 mg | ORAL_TABLET | Freq: Every day | ORAL | Status: DC
  Filled 2012-08-09: qty 1

## 2012-08-09 MED ORDER — LIDOCAINE 5 % EX PTCH
1.0000 | MEDICATED_PATCH | Freq: Every day | CUTANEOUS | Status: DC
  Administered 2012-08-09: 1 via TRANSDERMAL
  Filled 2012-08-09: qty 1

## 2012-08-09 MED ORDER — SODIUM CHLORIDE 0.9 % IV SOLN
90.0000 mg | Freq: Once | INTRAVENOUS | Status: AC
  Administered 2012-08-09: 90 mg via INTRAVENOUS
  Filled 2012-08-09: qty 10

## 2012-08-09 MED ORDER — POLYETHYLENE GLYCOL 3350 17 G PO PACK
17.0000 g | PACK | Freq: Every day | ORAL | Status: DC
  Administered 2012-08-09: 17 g via ORAL
  Filled 2012-08-09 (×2): qty 1

## 2012-08-09 MED ORDER — EXEMESTANE 25 MG PO TABS
25.0000 mg | ORAL_TABLET | Freq: Every day | ORAL | Status: DC
  Administered 2012-08-09 – 2012-08-10 (×2): 25 mg via ORAL
  Filled 2012-08-09 (×3): qty 1

## 2012-08-09 NOTE — Progress Notes (Signed)
[facility] PROGRESS NOTE  CASSARA NIDA ZOX:[id number] DOB: November 01, 1965 DOA: 08/04/2012 PCP: No primary Dianelly Ferran on file. Brief History [name] is a 47 y.o. female with PMH of GERD, HTN, breast cancer with mets to liver and thoracic vertebrae who presents with the above complaints of worsening pain esp in abd and back, as well as cough. She states that she has had a non productive cough for the past 1week but that it has worsened in the past 2days. Also she reports that she has pain all over which she has had for some time with her cancer, but the the pain has been worsening and especially in the past 2days the abdominal and back pain had worsened   Assessment/Plan: Principal Problem:  Abdominal pain & Back pain and New 11th rib pathologic fracture  -A discussed above, possibly secondary to metastatic disease- slight progression of liver disease with areas suggesting developing areas of necrosis noted and new 11th rib pathologic fracture noted as above  -UA is neg for UTI,  Lipase nl, CEs neg - continue current pain management - IV dilaudid prn, and continue outpt scheduled oxycontin and prn percocet  Active Problems: Influenza A. -continue Tamiflu, today is day 5/5 -afebrile overnight HCAP (healthcare-associated pneumonia)  -Continue current empiric antibiotics with Vanc, Levaquin,and cefepime  -Urinary Legionella and strep pneumo antigen is negative. -blood cultures NGTD, continue current abx- aebrile overnight as above- if no fever again overnight, will recommend deescalating abx tomorrow 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  -followed by Dr Darnelle Catalan, appreciate input and recs -discussed with Dr Michell Heinrich - to receive radiation today  Bil Pleural effusions  - Possibly secondary to infection vs malignancy  -abx as above  - fevers likely secondary to influenza>>continue Tamiflu Anemia  -secondary to ca  - follow and recheck, no gross bleeding  Hypokalemia  -s/p  repletion, resolved Tachycardia  -secondary to pain & infection  -improved with above treatment, cardiac enzymes negative. Constipation  -likely secondary to narcotics  -continue current bowel regimen- laxatives,enemas and follow Oral Thrush -on diflucan day 3/7 Dyspepsia/GERD vs radiation induced symptoms -improving clinically, better po intake this am -continue PPI, carafate, and prn GI cocktail -offered GI consult, but pt declined for now stating she wants to give current meds more time  Code Status: DNR Family Communication: pt at bedside Disposition Plan: to home likely in next 1-2days   Consultants:  Oncology  Procedures:  none  Antibiotics:  Levaquin, cefepime, and vancomycin started on 3/6  Tamiflu was started on 3/7  diflucan  HPI/Subjective: staes indigestion/heartburn less today, better po intake this am  Objective: Filed Vitals:   08/08/12 1350 08/08/12 2059 08/08/12 2150 08/09/12 0547  BP: 115/60 100/55 102/52 108/64  Pulse: 106 92 102 99  Temp: 98.6 F (37 C)  99.4 F (37.4 C) 98.3 F (36.8 C)  TempSrc:   Oral Oral  Resp: 18 18 18 18   Height:      Weight:      SpO2: 90% 94% 91% 96%    Intake/Output Summary (Last 24 hours) at 08/09/12 1126 Last data filed at 08/09/12 8119  Gross per 24 hour  Intake   3813 ml  Output   1151 ml  Net   2662 ml   Filed Weights   08/04/12 1800 08/06/12 0400  Weight: 60.7 kg (133 lb 13.1 oz) 59.3 kg (130 lb 11.7 oz)    Exam:   General:   in no respiratory distress   Cardiovascular: mild Tachycardia, regular  normal S1-S2.   Respiratory: decreased breath sounds in the lower lung fields.  Abdomen: distended, bowel sounds present, decreased tenderness    extremities: No cyanosis and no edema  Data Reviewed: Basic Metabolic Panel:  Recent Labs Lab 08/03/12 1609 08/04/12 0915 08/05/12 0530 08/05/12 0535 08/06/12 0354 08/08/12 0401  NA 142 139 135  --  133* 135  K 3.2* 3.2* 2.8*  --  4.2 4.2   CL 103 100 98  --  100 101  CO2 27 27 24   --  24 25  GLUCOSE 209* 110* 97  --  160* 87  BUN 9.3 7 7   --  6 6  CREATININE 0.9 0.69 0.66  --  0.63 0.64  CALCIUM 9.4 8.8 8.1*  --  8.5 8.9  MG  --   --   --  1.5  --   --    Liver Function Tests:  Recent Labs Lab 08/03/12 1609 08/04/12 0915 08/05/12 0530  AST 39* 38* 41*  ALT 19 17 13   ALKPHOS 293* 256* 224*  BILITOT 0.45 0.5 0.5  PROT 6.9 6.5 5.8*  ALBUMIN 3.1* 3.1* 2.6*    Recent Labs Lab 08/04/12 1840  LIPASE 7*   No results found for this basename: AMMONIA,  in the last 168 hours CBC:  Recent Labs Lab 08/03/12 1609 08/04/12 0915 08/05/12 0530 08/06/12 0345 08/08/12 0401  WBC 12.2* 7.7 6.9 5.4 3.8*  NEUTROABS 11.3* 7.2  --   --   --   HGB 9.2* 8.9* 8.0* 8.1* 8.5*  HCT 27.6* 26.2* 25.0* 24.6* 26.4*  MCV 78.7* 78.9 78.6 78.6 78.3  PLT 362 319 272 279 275   Cardiac Enzymes:  Recent Labs Lab 08/04/12 1840 08/04/12 2320 08/05/12 0530  TROPONINI <0.30 <0.30 <0.30   BNP (last 3 results) No results found for this basename: PROBNP,  in the last 8760 hours CBG:  Recent Labs Lab 08/08/12 2050  GLUCAP 95    Recent Results (from the past 240 hour(s))  CULTURE, BLOOD (ROUTINE X 2)     Status: None   Collection Time    08/04/12  1:55 PM      Result Value Range Status   Specimen Description BLOOD LEFT ARM   Final   Special Requests BOTTLES DRAWN AEROBIC AND ANAEROBIC   Final   Culture  Setup Time 08/04/2012 22:18   Final   Culture     Final   Value:        BLOOD CULTURE RECEIVED NO GROWTH TO DATE CULTURE WILL BE HELD FOR 5 DAYS BEFORE ISSUING A FINAL NEGATIVE REPORT   Report Status PENDING   Incomplete  CULTURE, BLOOD (ROUTINE X 2)     Status: None   Collection Time    08/04/12  2:00 PM      Result Value Range Status   Specimen Description BLOOD LEFT HAND   Final   Special Requests BOTTLES DRAWN AEROBIC AND ANAEROBIC   Final   Culture  Setup Time 08/04/2012 22:18   Final   Culture     Final    Value:        BLOOD CULTURE RECEIVED NO GROWTH TO DATE CULTURE WILL BE HELD FOR 5 DAYS BEFORE ISSUING A FINAL NEGATIVE REPORT   Report Status PENDING   Incomplete  MRSA PCR SCREENING     Status: None   Collection Time    08/04/12  5:15 PM      Result Value Range Status  MRSA by PCR NEGATIVE  NEGATIVE Final   Comment:            The GeneXpert MRSA Assay (FDA     approved for NASAL specimens     only), is one component of a     comprehensive MRSA colonization     surveillance program. It is not     intended to diagnose MRSA     infection nor to guide or     monitor treatment for     MRSA infections.  CULTURE, BLOOD (ROUTINE X 2)     Status: None   Collection Time    08/07/12 10:28 PM      Result Value Range Status   Specimen Description BLOOD LEFT ANTECUBITAL   Final   Special Requests BOTTLES DRAWN AEROBIC AND ANAEROBIC 4CC   Final   Culture  Setup Time 08/08/2012 03:24   Final   Culture     Final   Value:        BLOOD CULTURE RECEIVED NO GROWTH TO DATE CULTURE WILL BE HELD FOR 5 DAYS BEFORE ISSUING A FINAL NEGATIVE REPORT   Report Status PENDING   Incomplete  CULTURE, BLOOD (ROUTINE X 2)     Status: None   Collection Time    08/07/12 10:50 PM      Result Value Range Status   Specimen Description BLOOD LEFT HAND   Final   Special Requests BOTTLES DRAWN AEROBIC ONLY    Final   Culture  Setup Time 08/08/2012 14:02   Final   Culture     Final   Value:        BLOOD CULTURE RECEIVED NO GROWTH TO DATE CULTURE WILL BE HELD FOR 5 DAYS BEFORE ISSUING A FINAL NEGATIVE REPORT   Report Status PENDING   Incomplete     Studies: No results found.  Scheduled Meds: . ALPRAZolam  0.25 mg Oral BID  . antiseptic oral rinse  15 mL Mouth Rinse BID  . ceFEPime (MAXIPIME) IV  1 g Intravenous Q8H  . docusate sodium  100 mg Oral BID  . enoxaparin (LOVENOX) injection  40 mg Subcutaneous Q24H  . everolimus  10 mg Oral Daily  . exemestane  25 mg Oral QPC breakfast  . feeding supplement  237  mL Oral BID BM  . ferrous sulfate  325 mg Oral TID PC  . fluconazole  100 mg Oral Daily  . guaiFENesin  600 mg Oral BID  . lidocaine  1 patch Transdermal Daily  . oseltamivir  75 mg Oral BID  . OxyCODONE  60 mg Oral Q12H  . pamidronate  90 mg Intravenous Once  . pantoprazole  40 mg Oral Daily  . polyethylene glycol  17 g Oral Daily  . sucralfate  1 g Oral TID WC & HS  . vancomycin  1,000 mg Intravenous Q12H   Continuous Infusions: . 0.9 % NaCl with KCl 20 mEq / L 30 mL/hr at 08/08/12 1500    Principal Problem:   Abdominal pain Active Problems:   HCAP (healthcare-associated pneumonia)   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   Constipation   Back pain   Anemia   Hypokalemia   Tachycardia    Time spent: >25    Kela Millin  [facility] Pager [phone]. If 7PM-7AM, please contact night-coverage at [email or web address], password [facility] 08/09/2012, 11:26 AM  LOS: 5 days

## 2012-08-09 NOTE — Progress Notes (Signed)
[facility] Radiation Oncology Dept Therapy Treatment Record Phone [phone]   Radiation Therapy was administered to [name] on: 08/09/2012  2:50 PM and was treatment # 7 out of a planned course of 10 treatments.

## 2012-08-09 NOTE — Addendum Note (Signed)
Encounter addended by: Glennie Hawk, RN on: 08/09/2012  3:43 PM<BR>     Documentation filed: Inpatient Document Flowsheet

## 2012-08-09 NOTE — Progress Notes (Signed)
[name]   DOB:1965-09-11   ZO#:[id number]   JWJ#:[id number]  Subjective: [name]'s pain is much better controlled. She is having less trouble breathing, though still she feels sometimes she can't take a deep breath. There is no pleurisy and no purulent or bloody phlegm. She has a "great" BM recently. She is still weak. No family in room   Objective: middle aged [place] woman examined in bed Filed Vitals:   08/09/12 0547  BP: 108/64  Pulse: 99  Temp: 98.3 F (36.8 C)  Resp: 18    Body mass index is 23.16 kg/(m^2).  Intake/Output Summary (Last 24 hours) at 08/09/12 1002 Last data filed at 08/09/12 0620  Gross per 24 hour  Intake   3813 ml  Output   1151 ml  Net   2662 ml     Sclerae unicteric  Oropharynx clear  No peripheral adenopathy  Lungs clear, decreased excursion bilaterally  Heart regular rate and rhythm  Abdomen benign  MSK focal tenderness left mid back laterally  Neuro nonfocal  Breast exam: deferred  CBG (last 3)   Recent Labs  08/08/12 2050  GLUCAP 95     Labs:  Lab Results  Component Value Date   WBC 3.8* 08/08/2012   HGB 8.5* 08/08/2012   HCT 26.4* 08/08/2012   MCV 78.3 08/08/2012   PLT 275 08/08/2012   NEUTROABS 7.2 08/04/2012    @LASTCHEMISTRY @  Urine Studies No results found for this basename: UACOL, UAPR, USPG, UPH, UTP, UGL, UKET, UBIL, UHGB, UNIT, UROB, ULEU, UEPI, UWBC, URBC, UBAC, CAST, CRYS, UCOM, BILUA,  in the last 72 hours  Basic Metabolic Panel:  Recent Labs Lab 08/03/12 1609  08/04/12 0915 08/05/12 0530 08/05/12 0535 08/06/12 0354 08/08/12 0401  NA 142  --  139 135  --  133* 135  K 3.2*  < > 3.2* 2.8*  --  4.2 4.2  CL 103  --  100 98  --  100 101  CO2 27  --  27 24  --  24 25  GLUCOSE 209*  --  110* 97  --  160* 87  BUN 9.3  --  7 7  --  6 6  CREATININE 0.9  --  0.69 0.66  --  0.63 0.64  CALCIUM 9.4  --  8.8 8.1*  --  8.5 8.9  MG  --   --   --   --  1.5  --   --   < > = values in this interval not  displayed. GFR Estimated Creatinine Clearance: 71.9 ml/min (by C-G formula based on Cr of 0.64). Liver Function Tests:  Recent Labs Lab 08/03/12 1609 08/04/12 0915 08/05/12 0530  AST 39* 38* 41*  ALT 19 17 13   ALKPHOS 293* 256* 224*  BILITOT 0.45 0.5 0.5  PROT 6.9 6.5 5.8*  ALBUMIN 3.1* 3.1* 2.6*    Recent Labs Lab 08/04/12 1840  LIPASE 7*   No results found for this basename: AMMONIA,  in the last 168 hours Coagulation profile No results found for this basename: INR, PROTIME,  in the last 168 hours  CBC:  Recent Labs Lab 08/03/12 1609 08/04/12 0915 08/05/12 0530 08/06/12 0345 08/08/12 0401  WBC 12.2* 7.7 6.9 5.4 3.8*  NEUTROABS 11.3* 7.2  --   --   --   HGB 9.2* 8.9* 8.0* 8.1* 8.5*  HCT 27.6* 26.2* 25.0* 24.6* 26.4*  MCV 78.7* 78.9 78.6 78.6 78.3  PLT 362 319 272 279 275  Cardiac Enzymes:  Recent Labs Lab 08/04/12 1840 08/04/12 2320 08/05/12 0530  TROPONINI <0.30 <0.30 <0.30   BNP: No components found with this basename: POCBNP,  CBG:  Recent Labs Lab 08/08/12 2050  GLUCAP 95   D-Dimer No results found for this basename: DDIMER,  in the last 72 hours Hgb A1c No results found for this basename: HGBA1C,  in the last 72 hours Lipid Profile No results found for this basename: CHOL, HDL, LDLCALC, TRIG, CHOLHDL, LDLDIRECT,  in the last 72 hours Thyroid function studies No results found for this basename: TSH, T4TOTAL, FREET3, T3FREE, THYROIDAB,  in the last 72 hours Anemia work up No results found for this basename: VITAMINB12, FOLATE, FERRITIN, TIBC, IRON, RETICCTPCT,  in the last 72 hours Microbiology Recent Results (from the past 240 hour(s))  CULTURE, BLOOD (ROUTINE X 2)     Status: None   Collection Time    08/04/12  1:55 PM      Result Value Range Status   Specimen Description BLOOD LEFT ARM   Final   Special Requests BOTTLES DRAWN AEROBIC AND ANAEROBIC   Final   Culture  Setup Time 08/04/2012 22:18   Final   Culture     Final    Value:        BLOOD CULTURE RECEIVED NO GROWTH TO DATE CULTURE WILL BE HELD FOR 5 DAYS BEFORE ISSUING A FINAL NEGATIVE REPORT   Report Status PENDING   Incomplete  CULTURE, BLOOD (ROUTINE X 2)     Status: None   Collection Time    08/04/12  2:00 PM      Result Value Range Status   Specimen Description BLOOD LEFT HAND   Final   Special Requests BOTTLES DRAWN AEROBIC AND ANAEROBIC   Final   Culture  Setup Time 08/04/2012 22:18   Final   Culture     Final   Value:        BLOOD CULTURE RECEIVED NO GROWTH TO DATE CULTURE WILL BE HELD FOR 5 DAYS BEFORE ISSUING A FINAL NEGATIVE REPORT   Report Status PENDING   Incomplete  MRSA PCR SCREENING     Status: None   Collection Time    08/04/12  5:15 PM      Result Value Range Status   MRSA by PCR NEGATIVE  NEGATIVE Final   Comment:            The GeneXpert MRSA Assay (FDA     approved for NASAL specimens     only), is one component of a     comprehensive MRSA colonization     surveillance program. It is not     intended to diagnose MRSA     infection nor to guide or     monitor treatment for     MRSA infections.  CULTURE, BLOOD (ROUTINE X 2)     Status: None   Collection Time    08/07/12 10:28 PM      Result Value Range Status   Specimen Description BLOOD LEFT ANTECUBITAL   Final   Special Requests BOTTLES DRAWN AEROBIC AND ANAEROBIC 4CC   Final   Culture  Setup Time 08/08/2012 03:24   Final   Culture     Final   Value:        BLOOD CULTURE RECEIVED NO GROWTH TO DATE CULTURE WILL BE HELD FOR 5 DAYS BEFORE ISSUING A FINAL NEGATIVE REPORT   Report Status PENDING   Incomplete  CULTURE, BLOOD (ROUTINE  X 2)     Status: None   Collection Time    08/07/12 10:50 PM      Result Value Range Status   Specimen Description BLOOD LEFT HAND   Final   Special Requests BOTTLES DRAWN AEROBIC ONLY    Final   Culture  Setup Time 08/08/2012 14:02   Final   Culture     Final   Value:        BLOOD CULTURE RECEIVED NO GROWTH TO DATE CULTURE WILL BE  HELD FOR 5 DAYS BEFORE ISSUING A FINAL NEGATIVE REPORT   Report Status PENDING   Incomplete      Studies:  No results found.  Assessment: 47 y.o. [place] woman admitted with pain, SOB, found to be Influenza A positive 1. 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  2.Status post adjuvant chemotherapy July to November 2011, consisting of 4 cycles of dose dense doxorubicin and cyclophosphamide, followed by 4 of 12 planned weekly doses of paclitaxel given adjuvantly, discontinued secondary to peripheral neuropathy.  3. Status post radiation to the right chest wall, truncated due to problems with the patient missing appointments.  4. Tamoxifen started June of 2012, but discontinued due to nausea.  5. metastatic disease to liver and bone pathologically documented October of 2012, estrogen receptor 100% positive, progesterone receptor 7% positive, with no HER-2 amplification; treatment in the metastatic setting has consisted of:  6.Faslodex started 03/16/2011, discontinued November 2013 with progression. Zoladex every 3 months also started on 03/16/2011, and monthly zoledronic acid started November 2011, the Zometa I currently held due to pending dental procedures and possible extractions.  7. Status post thoracic laminectomy, T9-T11, for impending cord compression on 03/21/2011, followed by irradiation to the spine completed November 2011  8. 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 s/p palliative radiation to the Right hip clivus to 30 Gy completed 04/07/2012  10. Progression noted by scans in November 2013, at which time Faslodex was discontinued. Patient was started on exemestane in early December,  with everolimus started in mid December at 5 mg daily. Continues to receive Zoladex injections every 3 months, last given 05/13/2013..  11. Nondisplaced right femoral neck fracture, status post right hip arthroplasty on 05/04/2012 under the care of Dr[facility]Matt [facility].  12. Hypercalcemia with elevated serum creatinine: resolved  13. Receiving palliative radiation therapy for cord compression   Plan: I discussed [name]'s situation with her at length. She understands there has been some disease progression despite her current therapy. On the other hand it may be a bit early to give up on the exemestane/ everolimus combination. In addition I don't think we have better choices--at this point chemotherapy may be as likely to hurt as to help. For these reasons we decided to  (a) continue exemestane/ everolimus an additional 2 months then reassess  (b) work on the pain: I will add lidoderm to the area in the left upper back where she hurts the most  (c) start pamidronate now and follow-up with zolendronic acid as outpatient  (d) complete the planned radiation treatments under dr Michell Heinrich  She is interested in having a CNA help her at [facility] with plans for Home health as you are doing. Will follow with you while inpatient.  Appreciate your help to this patient!   MAGRINAT,GUSTAV C 08/09/2012

## 2012-08-09 NOTE — Progress Notes (Signed)
Pharmacy unable to dispense Afinitor.  Asked patient if someone from home could bring in medication.  She states that no one from home could bring the medication.  RN called pharmacy to notify.

## 2012-08-09 NOTE — Progress Notes (Signed)
Weekly Management Note Current Dose: 21  Gy  Projected Dose: 30 Gy   Narrative:  The patient presents for routine under treatment assessment.  CBCT/MVCT images/Port film x-rays were reviewed.  The chart was checked.Pain controlled at present. Would like to go home.   Physical Findings: Weight: 129 lb 4.8 oz (58.65 kg). Unchanged. Lidocaine patch on her back  Impression:  The patient is tolerating radiation.  Plan:  Continue treatment as planned. D/c lidocaine patch due to questionable increased absorption due to location over radiated skin and possible bolus effect. F/u in 1 month.

## 2012-08-09 NOTE — Progress Notes (Signed)
Pt remains in hospital, states her back pain level is 2-3. She has Lidocaine patch on mid upper back. She states her pain level is low because "they gave me pain medicine before I came down for treatment". She states she has dry cough, "is very tired". Pt on O2, wearing mask.

## 2012-08-09 NOTE — Telephone Encounter (Signed)
Per Dr Michell Heinrich, pt can receive radiation treatment today. Dr Michell Heinrich spoke w/hospitalist, and per hospitalist, pt will need to wear droplet mask but therapists do not need to wear mask. Informed Amy, RT on linac #2 who verbalized understanding.

## 2012-08-10 ENCOUNTER — Ambulatory Visit: Payer: Medicaid Other

## 2012-08-10 ENCOUNTER — Ambulatory Visit
Admission: RE | Admit: 2012-08-10 | Discharge: 2012-08-10 | Disposition: A | Discharge status: Home / Self Care | Payer: Medicaid Other | Source: Ambulatory Visit | Attending: Radiation Oncology | Admitting: Radiation Oncology

## 2012-08-10 DIAGNOSIS — J90 Pleural effusion, not elsewhere classified: Secondary | ICD-10-CM | POA: Diagnosis present

## 2012-08-10 DIAGNOSIS — J101 Influenza due to other identified influenza virus with other respiratory manifestations: Secondary | ICD-10-CM | POA: Diagnosis present

## 2012-08-10 DIAGNOSIS — B370 Candidal stomatitis: Secondary | ICD-10-CM | POA: Diagnosis present

## 2012-08-10 DIAGNOSIS — M8448XA Pathological fracture, other site, initial encounter for fracture: Secondary | ICD-10-CM

## 2012-08-10 LAB — CBC
MCH: 25.2 pg — ABNORMAL LOW (ref 26.0–34.0)
MCHC: 32.1 g/dL (ref 30.0–36.0)
Platelets: 284 10*3/uL (ref 150–400)
RBC: 3.02 MIL/uL — ABNORMAL LOW (ref 3.87–5.11)

## 2012-08-10 LAB — BASIC METABOLIC PANEL
BUN: 5 mg/dL — ABNORMAL LOW (ref 6–23)
CO2: 26 mEq/L (ref 19–32)
Chloride: 100 mEq/L (ref 96–112)
Creatinine, Ser: 0.6 mg/dL (ref 0.50–1.10)
GFR calc Af Amer: >90 mL/min (ref >90)
GFR calc non Af Amer: >90 mL/min (ref >90)
Potassium: 3.9 mEq/L (ref 3.5–5.1)

## 2012-08-10 LAB — CULTURE, BLOOD (ROUTINE X 2): Culture: NO GROWTH

## 2012-08-10 LAB — VANCOMYCIN, TROUGH: Vancomycin Tr: 19.1 ug/mL (ref 10.0–20.0)

## 2012-08-10 MED ORDER — OXYCODONE-ACETAMINOPHEN 10-325 MG PO TABS: 1.0000 | ORAL_TABLET | ORAL | Status: DC | PRN

## 2012-08-10 MED ORDER — ENSURE COMPLETE PO LIQD: 237.0000 mL | Freq: Two times a day (BID) | ORAL | Status: DC

## 2012-08-10 MED ORDER — LEVOFLOXACIN 500 MG PO TABS: 500.0000 mg | ORAL_TABLET | Freq: Every day | ORAL | Status: DC

## 2012-08-10 MED ORDER — FLUCONAZOLE 100 MG PO TABS: 100.0000 mg | ORAL_TABLET | Freq: Every day | ORAL | Status: DC

## 2012-08-10 MED ORDER — VANCOMYCIN HCL 1000 MG IV SOLR
750.0000 mg | Freq: Two times a day (BID) | INTRAVENOUS | Status: DC
  Filled 2012-08-10 (×2): qty 750

## 2012-08-10 MED ORDER — OXYCODONE HCL ER 60 MG PO T12A: 1.0000 | EXTENDED_RELEASE_TABLET | Freq: Two times a day (BID) | ORAL | Status: DC

## 2012-08-10 MED ORDER — PANTOPRAZOLE SODIUM 40 MG PO TBEC: 40.0000 mg | DELAYED_RELEASE_TABLET | Freq: Every day | ORAL | Status: DC

## 2012-08-10 NOTE — Progress Notes (Signed)
ANTIBIOTIC CONSULT NOTE - FOLLOW UP  Pharmacy Consult for Vancomycin, Cefepime Indication: HCAP  No Known Allergies  Patient Measurements: Height: 5\' 3"  (160 cm) Weight: 130 lb 11.7 oz (59.3 kg) IBW/kg (Calculated) : 52.4  Vital Signs: Temp: 98.1 F (36.7 C) (03/12 0610) Temp src: Oral (03/12 0610) BP: 107/63 mmHg (03/12 0610) Pulse Rate: 103 (03/12 0610) Intake/Output from previous day: 03/11 0701 - 03/12 0700 In: 678 [P.O.:120; I.V.:558] Out: 2500 [Urine:2500] Intake/Output from this shift:   Labs:  Recent Labs  08/08/12 0401 08/10/12 0344  WBC 3.8* 3.4*  HGB 8.5* 7.6*  PLT 275 284  CREATININE 0.64 0.60   Estimated Creatinine Clearance: 71.9 ml/min (by C-G formula based on Cr of 0.6).  Recent Labs  08/10/12 0850  VANCOTROUGH 19.1    Microbiology: Recent Results (from the past 720 hour(s))  CULTURE, BLOOD (ROUTINE X 2)     Status: None   Collection Time    08/04/12  1:55 PM      Result Value Range Status   Specimen Description BLOOD LEFT ARM   Final   Special Requests BOTTLES DRAWN AEROBIC AND ANAEROBIC   Final   Culture  Setup Time 08/04/2012 22:18   Final   Culture NO GROWTH 5 DAYS   Final   Report Status 08/10/2012 FINAL   Final  CULTURE, BLOOD (ROUTINE X 2)     Status: None   Collection Time    08/04/12  2:00 PM      Result Value Range Status   Specimen Description BLOOD LEFT HAND   Final   Special Requests BOTTLES DRAWN AEROBIC AND ANAEROBIC   Final   Culture  Setup Time 08/04/2012 22:18   Final   Culture NO GROWTH 5 DAYS   Final   Report Status 08/10/2012 FINAL   Final  MRSA PCR SCREENING     Status: None   Collection Time    08/04/12  5:15 PM      Result Value Range Status   MRSA by PCR NEGATIVE  NEGATIVE Final   Comment:            The GeneXpert MRSA Assay (FDA     approved for NASAL specimens     only), is one component of a     comprehensive MRSA colonization     surveillance program. It is not     intended to diagnose MRSA      infection nor to guide or     monitor treatment for     MRSA infections.  CULTURE, BLOOD (ROUTINE X 2)     Status: None   Collection Time    08/07/12 10:28 PM      Result Value Range Status   Specimen Description BLOOD LEFT ANTECUBITAL   Final   Special Requests BOTTLES DRAWN AEROBIC AND ANAEROBIC 4CC   Final   Culture  Setup Time 08/08/2012 03:24   Final   Culture     Final   Value:        BLOOD CULTURE RECEIVED NO GROWTH TO DATE CULTURE WILL BE HELD FOR 5 DAYS BEFORE ISSUING A FINAL NEGATIVE REPORT   Report Status PENDING   Incomplete  CULTURE, BLOOD (ROUTINE X 2)     Status: None   Collection Time    08/07/12 10:50 PM      Result Value Range Status   Specimen Description BLOOD LEFT HAND   Final   Special Requests BOTTLES DRAWN AEROBIC ONLY   Final   Culture  Setup Time 08/08/2012 14:02   Final   Culture     Final   Value:        BLOOD CULTURE RECEIVED NO GROWTH TO DATE CULTURE WILL BE HELD FOR 5 DAYS BEFORE ISSUING A FINAL NEGATIVE REPORT   Report Status PENDING   Incomplete    Anti-infectives   Start     Dose/Rate Route Frequency Ordered Stop   08/08/12 1000  fluconazole (DIFLUCAN) tablet 100 mg     100 mg Oral Daily 08/07/12 2119     08/07/12 1100  fluconazole (DIFLUCAN) tablet 200 mg     200 mg Oral  Once 08/07/12 1011 08/07/12 1115   08/05/12 2000  vancomycin (VANCOCIN) IVPB 1000 mg/200 mL premix     1,000 mg 200 mL/hr over 60 Minutes Intravenous Every 12 hours 08/05/12 1526     08/05/12 1200  oseltamivir (TAMIFLU) capsule 75 mg     75 mg Oral 2 times daily 08/05/12 1048 08/09/12 2128   08/04/12 2200  vancomycin (VANCOCIN) IVPB 1000 mg/200 mL premix  Status:  Discontinued     1,000 mg 200 mL/hr over 60 Minutes Intravenous Every 8 hours 08/04/12 1335 08/05/12 1526   08/04/12 1400  ceFEPIme (MAXIPIME) 1 g in dextrose 5 % 50 mL IVPB     1 g 100 mL/hr over 30 Minutes Intravenous 3 times per day 08/04/12 1312 08/12/12 1359   08/04/12 1330  vancomycin (VANCOCIN)  IVPB 1000 mg/200 mL premix     1,000 mg 200 mL/hr over 60 Minutes Intravenous  Once 08/04/12 1325 08/04/12 1659   08/04/12 1315  levofloxacin (LEVAQUIN) IVPB 750 mg     750 mg 100 mL/hr over 90 Minutes Intravenous Every 24 hours 08/04/12 1312 08/06/12 2030     Assessment: 46 yoF influenza positive, hx of metastatic Breast Ca.  D#7 Vancomycin, Cefepime  Completed 3 days Levaquin, 5 days Tamiflu  Blood cx 3/6; no growth-final, 3/9 no growth  Vanc trough 3/7 = 18.9; dose reduced to 1gm q12  Rpt Vanc trough 3/12 = 19.1  Goal of Therapy:  Vancomycin trough level 15-20 mcg/ml  Plan:  Reduce Vancomycin to 750mg  q12hr No change Cefepime 1gm q8h, total 8 days therapy  Otho Bellows PharmD Pager [phone] 08/10/2012, 10:36 AM

## 2012-08-10 NOTE — Progress Notes (Signed)
[name] called my office to tell me she was ready to go home. I explained she needed to discuss with the attending MD/Hospitalist, but from my point of view there is no problem with her discharge. She had daily radiation treatments through 3/14 and already has a followuo with me 4/3 at 1:30 PM. She will continue with exemestane/everolimus as before.  Thank you for your help to this patient!  GM

## 2012-08-10 NOTE — Discharge Summary (Addendum)
Physician Discharge Summary  [name] ZOX:[id number] DOB: Apr 16, 1966 DOA: 08/04/2012  PCP: No primary provider on file. Goes to [facility] when needed. Oncologist: Dr. Darnelle Catalan   Admit date: 08/04/2012 Discharge date: 08/10/2012  Recommendations for Outpatient Follow-up:  1. F/U with Dr. Darnelle Catalan as scheduled.  Discharge Diagnoses:  Principal Problem:    Influenza A (H1N1) with HCAP Active Problems:    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    GERD (gastroesophageal reflux disease)    Constipation    Back pain    Anemia    Abdominal pain    Hypokalemia    Tachycardia    Pathologic rib fracture    Pleural effusion, bilateral    Thrush   Discharge Condition: Improved.  Diet recommendation: Regular.  History of present illness:  [name] is a 47 year old woman with a PMH of breast cancer status post right mastectomy and axillary lymph node dissection November 2010 for a T3 N1(mic) Stage IIIA invasive ductal carcinoma, grade 3, estrogen and progesterone receptor positive, HER-2/neu negative, with an MIB-1 of 36%.  Status post adjuvant chemotherapy July to November 2011, consisting of 4 cycles of doxorubicin and cyclophosphamide, followed by 4 of 12 planned weekly doses of paclitaxel given adjuvantly, discontinued secondary to peripheral neuropathy, who presented to the hospital 08/04/12 with myalgias, abdominal and back pain.   Hospital Course by problem:  Principal Problem:   Influenza A (H1N1) with HCAP -Completed a course of Tamiflu and 7 days of appropriate antibiotics with cefepime, vancomycin, and Levaquin. Would continue Levaquin for an additional 3 days for a total treatment course of 10 days of therapy. Active Problems:   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 -Followup scheduled with Dr. Darnelle Catalan.  Continue Aromasin, Afinitor.   GERD (gastroesophageal reflux disease) -D/C home on Protonix.   Constipation -Continue bowel regimen.   Back pain / Abdominal  pain -Likely secondary from influenza associated myalgia and metastatic breast cancer. -Maintained on opiates for analgesia.   Anemia -Normocytic and likely secondary to sequela of prior chemotherapy and anemia of chronic disease. -No current indication for transfusion.   Hypokalemia -Resolved with supplements.   Tachycardia -Thought to be secondary to pain and infection. Improved. -Cardiac enzymes were cycled and were negative.   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 Treat with opiates as needed for pain control.   Pleural effusion, bilateral -Could consider outpatient thoracentesis if needed. Patient is requesting discharge and does not want to stay for further evaluation.   Thrush -Treated with Diflucan while in the hospital.  Will d/c on 7 day course.   Procedures:  None.  Consultations:  Dr. Ruthann Cancer, Oncology.  Discharge Exam: Filed Vitals:   08/10/12 0610  BP: 107/63  Pulse: 103  Temp: 98.1 F (36.7 C)  Resp: 16   Filed Vitals:   08/09/12 1425 08/09/12 2055 08/10/12 0010 08/10/12 0610  BP: 110/63 89/59 112/67 107/63  Pulse: 94 102  103  Temp:  98.1 F (36.7 C)  98.1 F (36.7 C)  TempSrc:  Oral  Oral  Resp: 18 19  16   Height:      Weight:      SpO2: 94% 94%  95%    Gen:  NAD Cardiovascular:  RRR, No M/R/G Respiratory: Lungs CTAB Gastrointestinal: Abdomen distended, non tender, with normal active bowel sounds. Extremities: No C/E/C   Discharge Instructions      Discharge Orders   Future Appointments Provider Department Dept Phone   08/11/2012 12:45 PM [facility] [id number]  [facility] [phone]   08/12/2012 1:25 PM [facility] [id number] [facility] [phone]   08/29/2012 10:15 AM Dava Najjar [facility] [id number]   09/01/2012 2:00 PM Lowella Dell, MD [facility] MEDICAL ONCOLOGY  [phone]   Future Orders Complete By Expires     Activity as tolerated - No restrictions  As directed     Call MD for:  persistant nausea and vomiting  As directed     Call MD for:  severe uncontrolled pain  As directed     Call MD for:  temperature >100.4  As directed     Diet general  As directed         Medication List    TAKE these medications       cyclobenzaprine 10 MG tablet  Commonly known as:  FLEXERIL  Take 10 mg by mouth 3 (three) times daily as needed for muscle spasms.     dexamethasone 4 MG tablet  Commonly known as:  DECADRON  Take 2 tablets (8 mg total) by mouth every 12 (twelve) hours.     docusate sodium 100 MG capsule  Commonly known as:  COLACE  Take 1 capsule (100 mg total) by mouth 2 (two) times daily as needed for constipation.     everolimus 5 MG tablet  Commonly known as:  AFINITOR  Take 5 mg by mouth daily.     exemestane 25 MG tablet  Commonly known as:  AROMASIN  Take 25 mg by mouth daily after breakfast.     ferrous sulfate 325 (65 FE) MG tablet  Take 325 mg by mouth 3 (three) times daily after meals.     fluconazole 100 MG tablet  Commonly known as:  DIFLUCAN  Take 1 tablet (100 mg total) by mouth daily.     levofloxacin 500 MG tablet  Commonly known as:  LEVAQUIN  Take 1 tablet (500 mg total) by mouth daily.     ondansetron 4 MG tablet  Commonly known as:  ZOFRAN  Take 4 mg by mouth every 6 (six) hours.     OxyCODONE HCl ER 60 MG T12a  Commonly known as:  OXYCONTIN  Take 1 tablet by mouth 2 (two) times daily.     oxyCODONE-acetaminophen 10-325 MG per tablet  Commonly known as:  PERCOCET  Take 1 tablet by mouth every 4 (four) hours as needed for pain.     pantoprazole 40 MG tablet  Commonly known as:  PROTONIX  Take 1 tablet (40 mg total) by mouth daily.     sennosides-docusate sodium 8.6-50 MG tablet  Commonly known as:  SENOKOT-S  Take 1 tablet by mouth daily as needed for constipation.     zolpidem 5 MG tablet   Commonly known as:  AMBIEN  Take 1 tablet (5 mg total) by mouth at bedtime as needed for sleep.       Follow-up Information   Follow up with Lowella Dell, MD. (At your appt times scheduled below)    Contact information:   [address] [place] [address] [phone]        The results of significant diagnostics from this hospitalization (including imaging, microbiology, ancillary and laboratory) are listed below for reference.    Significant Diagnostic Studies: Ct Chest W Contrast  08/04/2012  *RADIOLOGY REPORT*  Clinical Data:  Abdominal pain.  Metastatic breast cancer.  CT CHEST, ABDOMEN AND PELVIS WITH CONTRAST  Technique:  Multidetector CT imaging of the chest, abdomen and pelvis was performed following the standard protocol during bolus administration of intravenous contrast.  Contrast: OMNIPAQUE IOHEXOL 300 MG/ML  SOLN,  Comparison:  Chest CT 04/20/2012.  CT of the abdomen and pelvis 07/07/2012.  CT CHEST  Findings:  Mediastinum: Heart size is normal. There is no significant pericardial fluid, thickening or pericardial calcification.  There is some prominent but not enlarged nodal tissue in the low right paratracheal station which appears to represent a cluster of small lymph nodes.  Several other small borderline enlarged lymph nodes are noted in the mediastinum, most notably in the subcarinal station.  No definite pathologic nodal enlargement is noted. Esophagus is unremarkable in appearance.  Lungs/Pleura: Evaluation of the lung parenchyma is limited by extensive patient respiratory motion.  With this limitation in mind there is new peribronchovascular ground-glass attenuation air space disease in the left upper lobe, and to a lesser extent in the inferior segments of the left lower lobe, concerning for multilobar pneumonia.  No definite suspicious appearing pulmonary nodules or masses are confidently identified.  Moderate bilateral pleural effusions layer  dependently and are relatively simple in appearance, however, there are some areas of pleural thickening and enhancement, best demonstrated on image 43 of series 2 in the posterior aspect of the right pleural cavity, suggesting that there may be malignant pleural involvement.  Musculoskeletal: Postoperative changes of modified radical right sided mastectomy with right axillary clips compatible with prior nodal dissection.  Innumerable mixed lytic and sclerotic lesions are again noted throughout the visualized axial and appendicular skeleton, most pronounced throughout the thoracic spine.  There is a new nondisplaced pathologic fracture through the posterior aspect of the right eleventh rib.  Postoperative changes of laminectomy are noted at its T9-T10. Enhancing soft tissue in the epidural space in the left side of the upper thoracic spine in the right side of the lower thoracic spine, concerning for additional sites of metastatic disease.  No definite pathologic vertebral body compression fractures are identified at this time.  IMPRESSION:  1. Enlarging moderate bilateral pleural effusions, with some pleural thickening and enhancement, suggesting malignant pleural metastases.  Sampling of pleural fluid may be warranted if clinically indicated. 2. Progression of metastatic disease to the bones, particularly throughout the thoracic spine, with a new nondisplaced pathologic fracture of the posterior aspect of the right eleventh rib. Enhancing soft tissue in the epidural space adjacent to both the upper and lower thoracic spine is also noted, better demonstrated on recent MRI. 3.  New multifocal peribronchovascular air space disease throughout the left upper and lower lobe concerning for multilobar bronchopneumonia.  CT ABDOMEN AND PELVIS  Findings:  Abdomen/Pelvis: There are again innumerable hepatic nodules and masses, compatible with widespread metastatic disease, largest of which measures up to 10.8 x 9.8 cm in the  right lobe of the liver (image 48 of series 2).  Overall, the size of these lesions appears very similar to the prior examination, if not slightly increased, but there is increasing central low attenuation within many of these lesions, suggesting internal areas of necrosis and vascular compromise.  Some lesions do appear slightly larger, including a small lesion in the right lobe of the liver on image 66 of series 2 which currently measures 13 mm (previously only 9 mm).  The appearance of the gallbladder, pancreas, spleen, bilateral adrenal glands in the right kidney is unremarkable.  Parenchymal thinning in the interpolar region of the left kidney, and a tiny  2 mm nonobstructive calculus in the interpolar left kidney are both unchanged.  A tiny volume of ascites in the anatomic pelvis is simple in appearance.  Uterus is heterogeneous in appearance with multifocal low attenuation and partially calcified lesions, likely represent fibroids.  Ovaries are unremarkable in appearance. Assessment of the anatomic pelvis is significantly limited by extensive beam hardening artifact from the patient's right sided hip prosthesis.  The urinary bladder is moderately distended without focal abnormalities.  No pneumoperitoneum.  No pathologic distension of small bowel.  No definite pathologic lymphadenopathy identified within the abdomen or pelvis.  Relatively large volume of well formed stool throughout the colon may suggest constipation.  Musculoskeletal: Status post right total hip arthroplasty. Numerous mixed lytic and sclerotic lesions are again noted throughout the visualized axial and appendicular skeleton, similar in appearance to prior examinations.  IMPRESSION:  1.  Overall, the burden of metastatic disease in the bones and the liver is very similar to the prior examination, with suggestion of slight progression in the liver, as above.  Importantly, many of the liver lesions do show areas of internal decreased  enhancement, which could suggest developing areas of necrosis. 2.  New small volume of ascites. 3.  Moderate volume of well formed stool throughout the colon may suggest constipation. 4.  Additional incidental findings, as above.   Original Report Authenticated By: Trudie Reed, M.D.    Ct Chest W Contrast  08/04/2012  *RADIOLOGY REPORT*  Clinical Data:  Abdominal pain.  Metastatic breast cancer.  CT CHEST, ABDOMEN AND PELVIS WITH CONTRAST  Technique:  Multidetector CT imaging of the chest, abdomen and pelvis was performed following the standard protocol during bolus administration of intravenous contrast.  Contrast: OMNIPAQUE IOHEXOL 300 MG/ML  SOLN,  Comparison:  Chest CT 04/20/2012.  CT of the abdomen and pelvis 07/07/2012.  CT CHEST  Findings:  Mediastinum: Heart size is normal. There is no significant pericardial fluid, thickening or pericardial calcification.  There is some prominent but not enlarged nodal tissue in the low right paratracheal station which appears to represent a cluster of small lymph nodes.  Several other small borderline enlarged lymph nodes are noted in the mediastinum, most notably in the subcarinal station.  No definite pathologic nodal enlargement is noted. Esophagus is unremarkable in appearance.  Lungs/Pleura: Evaluation of the lung parenchyma is limited by extensive patient respiratory motion.  With this limitation in mind there is new peribronchovascular ground-glass attenuation air space disease in the left upper lobe, and to a lesser extent in the inferior segments of the left lower lobe, concerning for multilobar pneumonia.  No definite suspicious appearing pulmonary nodules or masses are confidently identified.  Moderate bilateral pleural effusions layer dependently and are relatively simple in appearance, however, there are some areas of pleural thickening and enhancement, best demonstrated on image 43 of series 2 in the posterior aspect of the right pleural cavity,  suggesting that there may be malignant pleural involvement.  Musculoskeletal: Postoperative changes of modified radical right sided mastectomy with right axillary clips compatible with prior nodal dissection.  Innumerable mixed lytic and sclerotic lesions are again noted throughout the visualized axial and appendicular skeleton, most pronounced throughout the thoracic spine.  There is a new nondisplaced pathologic fracture through the posterior aspect of the right eleventh rib.  Postoperative changes of laminectomy are noted at its T9-T10. Enhancing soft tissue in the epidural space in the left side of the upper thoracic spine in the right side of the lower thoracic spine,  concerning for additional sites of metastatic disease.  No definite pathologic vertebral body compression fractures are identified at this time.  IMPRESSION:  1. Enlarging moderate bilateral pleural effusions, with some pleural thickening and enhancement, suggesting malignant pleural metastases.  Sampling of pleural fluid may be warranted if clinically indicated. 2. Progression of metastatic disease to the bones, particularly throughout the thoracic spine, with a new nondisplaced pathologic fracture of the posterior aspect of the right eleventh rib. Enhancing soft tissue in the epidural space adjacent to both the upper and lower thoracic spine is also noted, better demonstrated on recent MRI. 3.  New multifocal peribronchovascular air space disease throughout the left upper and lower lobe concerning for multilobar bronchopneumonia.  CT ABDOMEN AND PELVIS  Findings:  Abdomen/Pelvis: There are again innumerable hepatic nodules and masses, compatible with widespread metastatic disease, largest of which measures up to 10.8 x 9.8 cm in the right lobe of the liver (image 48 of series 2).  Overall, the size of these lesions appears very similar to the prior examination, if not slightly increased, but there is increasing central low attenuation within  many of these lesions, suggesting internal areas of necrosis and vascular compromise.  Some lesions do appear slightly larger, including a small lesion in the right lobe of the liver on image 66 of series 2 which currently measures 13 mm (previously only 9 mm).  The appearance of the gallbladder, pancreas, spleen, bilateral adrenal glands in the right kidney is unremarkable.  Parenchymal thinning in the interpolar region of the left kidney, and a tiny 2 mm nonobstructive calculus in the interpolar left kidney are both unchanged.  A tiny volume of ascites in the anatomic pelvis is simple in appearance.  Uterus is heterogeneous in appearance with multifocal low attenuation and partially calcified lesions, likely represent fibroids.  Ovaries are unremarkable in appearance. Assessment of the anatomic pelvis is significantly limited by extensive beam hardening artifact from the patient's right sided hip prosthesis.  The urinary bladder is moderately distended without focal abnormalities.  No pneumoperitoneum.  No pathologic distension of small bowel.  No definite pathologic lymphadenopathy identified within the abdomen or pelvis.  Relatively large volume of well formed stool throughout the colon may suggest constipation.  Musculoskeletal: Status post right total hip arthroplasty. Numerous mixed lytic and sclerotic lesions are again noted throughout the visualized axial and appendicular skeleton, similar in appearance to prior examinations.  IMPRESSION:  1.  Overall, the burden of metastatic disease in the bones and the liver is very similar to the prior examination, with suggestion of slight progression in the liver, as above.  Importantly, many of the liver lesions do show areas of internal decreased enhancement, which could suggest developing areas of necrosis. 2.  New small volume of ascites. 3.  Moderate volume of well formed stool throughout the colon may suggest constipation. 4.  Additional incidental findings, as  above.   Original Report Authenticated By: Trudie Reed, M.D.    Mr Thoracic Spine W Wo Contrast  07/21/2012  *RADIOLOGY REPORT*  Clinical Data: Breast cancer with recurrent back pain.  Prior thoracic decompression.  MRI THORACIC SPINE WITHOUT AND WITH CONTRAST  Technique:  Multiplanar and multiecho pulse sequences of the thoracic spine were obtained without and with intravenous contrast.  Contrast: 12mL MULTIHANCE GADOBENATE DIMEGLUMINE 529 MG/ML IV SOLN  Comparison: MRI 03/21/2011.  CT chest 04/20/2012.  Findings: The patient has undergone previous T9-T11 thoracic laminectomy with epidural tumor resection.  The surgical area is well decompressed without residual  or recurrent thoracic cord compression.  Since the prior MR, there has developed widespread osseous disease. Post XRT changes are noted in the marrow admixed with areas of tumor; some lesions are sclerotic, for instance T9, T10, and T11.  There is epidural tumor which is compressing the thoracic cord from T4-T7.  This epidural tumor is greater on the left and is associated with a moderately large left T5 paravertebral mass (image 15 series 12); the cord is squeezed from side-to-side although the epidural tumor is greater on the left.  The cord is also displaced from left to right.  There is slight increased cord signal at the T4 and T5 levels.  Slight epidural tumor at T10 and T1 is non compressive.  IMPRESSION: Widespread osseous metastatic disease as described.  Interval development since 2012 of new epidural tumor extending from T4-T7 which compresses the thoracic cord and displaces it from left to right as described above.  Moderate left paravertebral mass maximal at T5.  Satisfactory appearance status post thoracic laminectomy and decompression T9-T11.  Findings discussed with Dr. Lynelle Doctor shortly after completion of the study.   Original Report Authenticated By: Davonna Belling, M.D.    Ct Abdomen Pelvis W Contrast  08/04/2012  *RADIOLOGY REPORT*   Clinical Data:  Abdominal pain.  Metastatic breast cancer.  CT CHEST, ABDOMEN AND PELVIS WITH CONTRAST  Technique:  Multidetector CT imaging of the chest, abdomen and pelvis was performed following the standard protocol during bolus administration of intravenous contrast.  Contrast: OMNIPAQUE IOHEXOL 300 MG/ML  SOLN,  Comparison:  Chest CT 04/20/2012.  CT of the abdomen and pelvis 07/07/2012.  CT CHEST  Findings:  Mediastinum: Heart size is normal. There is no significant pericardial fluid, thickening or pericardial calcification.  There is some prominent but not enlarged nodal tissue in the low right paratracheal station which appears to represent a cluster of small lymph nodes.  Several other small borderline enlarged lymph nodes are noted in the mediastinum, most notably in the subcarinal station.  No definite pathologic nodal enlargement is noted. Esophagus is unremarkable in appearance.  Lungs/Pleura: Evaluation of the lung parenchyma is limited by extensive patient respiratory motion.  With this limitation in mind there is new peribronchovascular ground-glass attenuation air space disease in the left upper lobe, and to a lesser extent in the inferior segments of the left lower lobe, concerning for multilobar pneumonia.  No definite suspicious appearing pulmonary nodules or masses are confidently identified.  Moderate bilateral pleural effusions layer dependently and are relatively simple in appearance, however, there are some areas of pleural thickening and enhancement, best demonstrated on image 43 of series 2 in the posterior aspect of the right pleural cavity, suggesting that there may be malignant pleural involvement.  Musculoskeletal: Postoperative changes of modified radical right sided mastectomy with right axillary clips compatible with prior nodal dissection.  Innumerable mixed lytic and sclerotic lesions are again noted throughout the visualized axial and appendicular skeleton, most pronounced  throughout the thoracic spine.  There is a new nondisplaced pathologic fracture through the posterior aspect of the right eleventh rib.  Postoperative changes of laminectomy are noted at its T9-T10. Enhancing soft tissue in the epidural space in the left side of the upper thoracic spine in the right side of the lower thoracic spine, concerning for additional sites of metastatic disease.  No definite pathologic vertebral body compression fractures are identified at this time.  IMPRESSION:  1. Enlarging moderate bilateral pleural effusions, with some pleural thickening and enhancement, suggesting  malignant pleural metastases.  Sampling of pleural fluid may be warranted if clinically indicated. 2. Progression of metastatic disease to the bones, particularly throughout the thoracic spine, with a new nondisplaced pathologic fracture of the posterior aspect of the right eleventh rib. Enhancing soft tissue in the epidural space adjacent to both the upper and lower thoracic spine is also noted, better demonstrated on recent MRI. 3.  New multifocal peribronchovascular air space disease throughout the left upper and lower lobe concerning for multilobar bronchopneumonia.  CT ABDOMEN AND PELVIS  Findings:  Abdomen/Pelvis: There are again innumerable hepatic nodules and masses, compatible with widespread metastatic disease, largest of which measures up to 10.8 x 9.8 cm in the right lobe of the liver (image 48 of series 2).  Overall, the size of these lesions appears very similar to the prior examination, if not slightly increased, but there is increasing central low attenuation within many of these lesions, suggesting internal areas of necrosis and vascular compromise.  Some lesions do appear slightly larger, including a small lesion in the right lobe of the liver on image 66 of series 2 which currently measures 13 mm (previously only 9 mm).  The appearance of the gallbladder, pancreas, spleen, bilateral adrenal glands in the  right kidney is unremarkable.  Parenchymal thinning in the interpolar region of the left kidney, and a tiny 2 mm nonobstructive calculus in the interpolar left kidney are both unchanged.  A tiny volume of ascites in the anatomic pelvis is simple in appearance.  Uterus is heterogeneous in appearance with multifocal low attenuation and partially calcified lesions, likely represent fibroids.  Ovaries are unremarkable in appearance. Assessment of the anatomic pelvis is significantly limited by extensive beam hardening artifact from the patient's right sided hip prosthesis.  The urinary bladder is moderately distended without focal abnormalities.  No pneumoperitoneum.  No pathologic distension of small bowel.  No definite pathologic lymphadenopathy identified within the abdomen or pelvis.  Relatively large volume of well formed stool throughout the colon may suggest constipation.  Musculoskeletal: Status post right total hip arthroplasty. Numerous mixed lytic and sclerotic lesions are again noted throughout the visualized axial and appendicular skeleton, similar in appearance to prior examinations.  IMPRESSION:  1.  Overall, the burden of metastatic disease in the bones and the liver is very similar to the prior examination, with suggestion of slight progression in the liver, as above.  Importantly, many of the liver lesions do show areas of internal decreased enhancement, which could suggest developing areas of necrosis. 2.  New small volume of ascites. 3.  Moderate volume of well formed stool throughout the colon may suggest constipation. 4.  Additional incidental findings, as above.   Original Report Authenticated By: Trudie Reed, M.D.     Labs:  Basic Metabolic Panel:  Recent Labs Lab 08/04/12 0915 08/05/12 0530 08/05/12 0535 08/06/12 0354 08/08/12 0401 08/10/12 0344  NA 139 135  --  133* 135 136  K 3.2* 2.8*  --  4.2 4.2 3.9  CL 100 98  --  100 101 100  CO2 27 24  --  24 25 26   GLUCOSE 110* 97   --  160* 87 93  BUN 7 7  --  6 6 5*  CREATININE 0.69 0.66  --  0.63 0.64 0.60  CALCIUM 8.8 8.1*  --  8.5 8.9 9.1  MG  --   --  1.5  --   --   --    GFR Estimated Creatinine Clearance: 71.9 ml/min (  by C-G formula based on Cr of 0.6). Liver Function Tests:  Recent Labs Lab 08/03/12 1609 08/04/12 0915 08/05/12 0530  AST 39* 38* 41*  ALT 19 17 13   ALKPHOS 293* 256* 224*  BILITOT 0.45 0.5 0.5  PROT 6.9 6.5 5.8*  ALBUMIN 3.1* 3.1* 2.6*    Recent Labs Lab 08/04/12 1840  LIPASE 7*    CBC:  Recent Labs Lab 08/03/12 1609 08/04/12 0915 08/05/12 0530 08/06/12 0345 08/08/12 0401 08/10/12 0344  WBC 12.2* 7.7 6.9 5.4 3.8* 3.4*  NEUTROABS 11.3* 7.2  --   --   --   --   HGB 9.2* 8.9* 8.0* 8.1* 8.5* 7.6*  HCT 27.6* 26.2* 25.0* 24.6* 26.4* 23.7*  MCV 78.7* 78.9 78.6 78.6 78.3 78.5  PLT 362 319 272 279 275 284   Cardiac Enzymes:  Recent Labs Lab 08/04/12 1840 08/04/12 2320 08/05/12 0530  TROPONINI <0.30 <0.30 <0.30   CBG:  Recent Labs Lab 08/08/12 2050  GLUCAP 95   Microbiology Recent Results (from the past 240 hour(s))  CULTURE, BLOOD (ROUTINE X 2)     Status: None   Collection Time    08/04/12  1:55 PM      Result Value Range Status   Specimen Description BLOOD LEFT ARM   Final   Special Requests BOTTLES DRAWN AEROBIC AND ANAEROBIC   Final   Culture  Setup Time 08/04/2012 22:18   Final   Culture NO GROWTH 5 DAYS   Final   Report Status 08/10/2012 FINAL   Final  CULTURE, BLOOD (ROUTINE X 2)     Status: None   Collection Time    08/04/12  2:00 PM      Result Value Range Status   Specimen Description BLOOD LEFT HAND   Final   Special Requests BOTTLES DRAWN AEROBIC AND ANAEROBIC   Final   Culture  Setup Time 08/04/2012 22:18   Final   Culture NO GROWTH 5 DAYS   Final   Report Status 08/10/2012 FINAL   Final  MRSA PCR SCREENING     Status: None   Collection Time    08/04/12  5:15 PM      Result Value Range Status   MRSA by PCR NEGATIVE  NEGATIVE  Final   Comment:            The GeneXpert MRSA Assay (FDA     approved for NASAL specimens     only), is one component of a     comprehensive MRSA colonization     surveillance program. It is not     intended to diagnose MRSA     infection nor to guide or     monitor treatment for     MRSA infections.  CULTURE, BLOOD (ROUTINE X 2)     Status: None   Collection Time    08/07/12 10:28 PM      Result Value Range Status   Specimen Description BLOOD LEFT ANTECUBITAL   Final   Special Requests BOTTLES DRAWN AEROBIC AND ANAEROBIC 4CC   Final   Culture  Setup Time 08/08/2012 03:24   Final   Culture     Final   Value:        BLOOD CULTURE RECEIVED NO GROWTH TO DATE CULTURE WILL BE HELD FOR 5 DAYS BEFORE ISSUING A FINAL NEGATIVE REPORT   Report Status PENDING   Incomplete  CULTURE, BLOOD (ROUTINE X 2)     Status: None   Collection Time  08/07/12 10:50 PM      Result Value Range Status   Specimen Description BLOOD LEFT HAND   Final   Special Requests BOTTLES DRAWN AEROBIC ONLY    Final   Culture  Setup Time 08/08/2012 14:02   Final   Culture     Final   Value:        BLOOD CULTURE RECEIVED NO GROWTH TO DATE CULTURE WILL BE HELD FOR 5 DAYS BEFORE ISSUING A FINAL NEGATIVE REPORT   Report Status PENDING   Incomplete    Time coordinating discharge: 35 minutes.  Signed:  RAMA,CHRISTINA  Pager [phone] [facility] 08/10/2012, 11:37 AM

## 2012-08-10 NOTE — Progress Notes (Signed)
[facility] Radiation Oncology Dept Therapy Treatment Record Phone [phone]   Radiation Therapy was administered to [name] on: 08/10/2012  10:02 AM and was treatment # 8 out of a planned course of 10 treatments.

## 2012-08-10 NOTE — Progress Notes (Signed)
Reviewed AVS and prescriptions with patient she verbalized understanding.

## 2012-08-11 ENCOUNTER — Ambulatory Visit: Payer: Medicaid Other

## 2012-08-11 ENCOUNTER — Ambulatory Visit
Admission: RE | Admit: 2012-08-11 | Discharge: 2012-08-11 | Disposition: A | Discharge status: Home / Self Care | Payer: Medicaid Other | Source: Ambulatory Visit | Attending: Radiation Oncology | Admitting: Radiation Oncology

## 2012-08-12 ENCOUNTER — Ambulatory Visit: Payer: Medicaid Other

## 2012-08-14 LAB — CULTURE, BLOOD (ROUTINE X 2): Culture: NO GROWTH

## 2012-08-15 ENCOUNTER — Other Ambulatory Visit: Payer: Self-pay | Admitting: *Deleted

## 2012-08-15 ENCOUNTER — Ambulatory Visit
Admission: RE | Admit: 2012-08-15 | Discharge: 2012-08-15 | Disposition: A | Discharge status: Home / Self Care | Payer: Medicaid Other | Source: Ambulatory Visit | Attending: Radiation Oncology | Admitting: Radiation Oncology

## 2012-08-15 ENCOUNTER — Encounter: Payer: Self-pay | Admitting: Radiation Oncology

## 2012-08-15 MED ORDER — OXYCODONE-ACETAMINOPHEN 10-325 MG PO TABS: 1.0000 | ORAL_TABLET | ORAL | Status: DC | PRN

## 2012-08-15 NOTE — Telephone Encounter (Signed)
This RN spoke with pt per her request for refill per " pills fell in sink ".  Informed pt per policies of controlled medications early refills are monitored and audited if needed. Discussed with pt need to be careful with medications due to if occurs again refills cannot be legally dispensed. Reaffirmed with pt need to be responsible with controlled substance for her benefit for pain control.  Paighton verbalized understanding.

## 2012-08-16 ENCOUNTER — Ambulatory Visit: Payer: Medicaid Other

## 2012-08-16 ENCOUNTER — Telehealth: Payer: Self-pay | Admitting: *Deleted

## 2012-08-17 ENCOUNTER — Other Ambulatory Visit: Payer: Self-pay | Admitting: *Deleted

## 2012-08-17 ENCOUNTER — Ambulatory Visit: Payer: Medicaid Other

## 2012-08-17 DIAGNOSIS — C50911 Malignant neoplasm of unspecified site of right female breast: Secondary | ICD-10-CM

## 2012-08-17 MED ORDER — OXYCODONE HCL 5 MG PO TABS: 5.0000 mg | ORAL_TABLET | ORAL | Status: DC | PRN

## 2012-08-17 NOTE — Telephone Encounter (Signed)
error 

## 2012-08-17 NOTE — Telephone Encounter (Signed)
Received call from patient stating the prescription she received on Monday was for 5-325 percocet and it was supposed to be for 10-325.  I called and spoke with Kirt Boys at [facility] to verify the prescription and it was handwritten by Dr.Magrinat for Percocet 5-325.  Per Vikki Ports we can give her a prescription for Oxy IR 5mg  to take with her 5-325 to make it a total of 10mg  oxycodone. Called and left patient message to give me a return phone call.

## 2012-08-18 ENCOUNTER — Ambulatory Visit: Payer: Medicaid Other

## 2012-08-19 ENCOUNTER — Telehealth: Payer: Self-pay | Admitting: *Deleted

## 2012-08-19 ENCOUNTER — Ambulatory Visit: Payer: Medicaid Other

## 2012-08-19 NOTE — Telephone Encounter (Signed)
CALLED PATIENT TO INFORM OF FU APPT, HER PHONE WOULDN'T LET ME LEAVE A MESSAGE, MAILED APPT. CARD

## 2012-08-22 ENCOUNTER — Other Ambulatory Visit: Payer: Self-pay | Admitting: *Deleted

## 2012-08-22 ENCOUNTER — Ambulatory Visit (HOSPITAL_BASED_OUTPATIENT_CLINIC_OR_DEPARTMENT_OTHER): Payer: Medicaid Other | Admitting: Oncology

## 2012-08-22 ENCOUNTER — Ambulatory Visit (HOSPITAL_BASED_OUTPATIENT_CLINIC_OR_DEPARTMENT_OTHER): Payer: Medicaid Other | Admitting: Lab

## 2012-08-22 ENCOUNTER — Telehealth: Payer: Self-pay | Admitting: *Deleted

## 2012-08-22 ENCOUNTER — Other Ambulatory Visit: Payer: Self-pay | Admitting: Oncology

## 2012-08-22 ENCOUNTER — Encounter: Payer: Self-pay | Admitting: *Deleted

## 2012-08-22 ENCOUNTER — Ambulatory Visit: Payer: Medicaid Other

## 2012-08-22 ENCOUNTER — Encounter: Payer: Self-pay | Admitting: Oncology

## 2012-08-22 DIAGNOSIS — C50919 Malignant neoplasm of unspecified site of unspecified female breast: Secondary | ICD-10-CM

## 2012-08-22 DIAGNOSIS — C7951 Secondary malignant neoplasm of bone: Secondary | ICD-10-CM

## 2012-08-22 DIAGNOSIS — C787 Secondary malignant neoplasm of liver and intrahepatic bile duct: Secondary | ICD-10-CM

## 2012-08-22 DIAGNOSIS — G893 Neoplasm related pain (acute) (chronic): Secondary | ICD-10-CM

## 2012-08-22 LAB — HOLD TUBE, BLOOD BANK

## 2012-08-22 LAB — CBC WITH DIFFERENTIAL/PLATELET
BASO%: 0.1 % (ref 0.0–2.0)
MCHC: 34.1 g/dL (ref 31.5–36.0)
MONO#: 0.8 10*3/uL (ref 0.1–0.9)
RBC: 3 10*6/uL — ABNORMAL LOW (ref 3.70–5.45)
WBC: 8.3 10*3/uL (ref 3.9–10.3)
lymph#: 0.9 10*3/uL (ref 0.9–3.3)

## 2012-08-22 MED ORDER — OXYCODONE HCL ER 60 MG PO T12A: 2.0000 | EXTENDED_RELEASE_TABLET | Freq: Two times a day (BID) | ORAL | Status: DC

## 2012-08-22 MED ORDER — OXYCODONE HCL 5 MG PO TABS: 15.0000 mg | ORAL_TABLET | Freq: Four times a day (QID) | ORAL | Status: DC | PRN

## 2012-08-22 NOTE — Progress Notes (Signed)
Patient came in and said she spoke with Dr. Darnelle Catalan today and he said for her to come and have labs then see him. I called lab and went over to Dr. Lacey Jensen nurse in). I took the patient to scheduling to have her worked in for lab and then with Dr. I advised the patient she would have to be worked in since no appt.

## 2012-08-22 NOTE — Progress Notes (Signed)
Discussed pt's last radiation treatment w/Dr Dayton Scrape. Per Dr Dayton Scrape, pt will not receive last radiation treatment because "it would be biologically ineffective". Jenna RT on Lin[place] #2 notified, left vm on pt's # [phone] notifying her that per Dr Dayton Scrape, on call dr her last radiation tx is cancelled. Left name and call back # for pt.

## 2012-08-22 NOTE — Progress Notes (Signed)
ID: [name]   DOB: Jun 30, 1965  MR#: [id number]  CSN#:[id number]  HISTORY OF PRESENT ILLNESS: The patient delivered a child on 05/17/2009, after which she felt fullness in the right breast. She eventually brought this to her doctors attention and was scheduled for mammography and ultrasound on 03/08/2009. There were no prior exams for comparison. Mammography showed the breast to be dense, with a 5 cm obscured mass in the outer midportion of the right breast. Mass was easily palpable, and there was also a mass in the outer right periareolar region. They were suspicious microcalcifications in the outer midportion of the right breast, as well as within the mass itself. Ultrasound showed an extensive heterogeneous area, corresponding to the palpable abnormality with multiple solid and cystic foci. Overall, the mass measured in excess of 5 cm, in addition to cysts in the breast.  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 The tumors were ER +100%, PR +80%.  Bilateral breast MRI was obtained 03/22/2009. There was a large area of abnormal enhancement in the lower outer quadrant of the right breast measuring up to 9.7 cm. No abnormal appearing lymph nodes and no other masses in either breast.  Patient underwent definitive right mastectomy and sentinel lymph node sampling on 04/29/2009 under the care of Dr. Derrell Lolling. 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 Repeat prognostic panel showed tumor to be ER +93% and PR +51%. HER-2/neu was negative with a ratio of 0.71, with proliferation marker of 36%.  Patient was evaluated by Dr. Darnelle Catalan in January 2011 but failed to keep appointments until she was seen here again in July 2011. In July, she began adjuvant chemotherapy. She received 4 cycles of dose dense  doxorubicin and cyclophosphamide followed by 4 weekly doses of paclitaxel which was discontinued in November 2011 secondary to peripheral neuropathy. The patient then received radiation therapy to the right chest wall which was truncated due to problems with missing appointments.  Patient was started on tamoxifen briefly in June of 2012 which was discontinued due to nausea. The nausea continued, and an abdominal CT in October 2012 confirmed a growth in liver lesions. These were biopsied on 03/10/2011 and showed metastatic adenocarcinoma, still ER positive at 100%, PR +5%. Her subsequent treatments are as detailed below  INTERVAL HISTORY: Priseis returns today for an unscheduled visit. She called to tell me that she was running out of of her pain medicine. I asked her to come in so we could discuss this issue.  REVIEW OF SYSTEMS: She is taking 2 of the OxyContin 60 mg in the morning and one in the evening. She then takes 2 Percocet (10/325) every 4 hours. She says by doing this she is "comfortable" and can function. She has been very resistant to switch to oxycodone without Tylenol in the past. She tells me she is not constipated. In addition the neighbor that she has been trying to have a proved to get her some help during the week apparently has been approved and she expects to be getting a little bit of assistance on that score (i help with cleaning, cooking, and doing other chores). A detailed review of systems today was otherwise stable   PAST MEDICAL HISTORY: Past Medical History  Diagnosis Date  . Hypertension   . GERD (gastroesophageal reflux disease)   . Bone metastases 2012    "  spread from my breast"  . Pyelonephritis 10/01/11  . Recurrent UTI (urinary tract infection) 10/01/11  . S/P chemotherapy, time since 4-12 weeks   . S/P radiation > 12 weeks 03/15/12- 04/07/12    right hip, clivus  . 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 Brain cancer     spread from  breast ca  . Hypercalcemia 06/21/2012  . History of radiation therapy 08/06/10 -09/16/10    R supraclav fossa, R chest wall, R drain site    PAST SURGICAL HISTORY: Past Surgical History  Procedure Laterality Date  . Cesarean section  1989; 2010  . Mastectomy  2011    right  . Back surgery  2012    "removed tumor,cancer, from my spine  . Port-a-cath removal  2012    right chest  . Portacath placement  2011    right  . Tubal ligation  2010  . Breast biopsy  2011    right  . Hip arthroplasty  05/04/2012    Procedure: ARTHROPLASTY BIPOLAR HIP;  Surgeon: Shelda Pal, MD;  Location: [facility];  Service: Orthopedics;  Laterality: Right;  RIGHT HIP HEMI ARTHROPLASTY     FAMILY HISTORY The patient's father is alive at age 72.  The patient's mother is alive at age 37.  She has four sisters.  She had one brother who died from colon cancer at the age of 83.  There is no breast or ovarian cancer history in the family to her knowledge.  GYNECOLOGIC HISTORY: She is GX P2.  First pregnancy to term at age 8.    SOCIAL HISTORY: The patient is not employed.  She lives by herself with her small son whose name is [name] (he is currently staying with his dad in [place]).  Her first child, [name], is studying business in this area.  The patient has one grandchild.  She attends the [employer].    ADVANCED DIRECTIVES: not in place  HEALTH MAINTENANCE: History  Substance Use Topics  . Smoking status: Former Smoker -- 0.25 packs/day for 31 years    Types: Cigarettes    Quit date: 04/26/2012  . Smokeless tobacco: Never Used  . Alcohol Use: No     Colonoscopy:  PAP:  Bone density:  Lipid panel:  No Known Allergies  Current Outpatient Prescriptions  Medication Sig Dispense Refill  . cyclobenzaprine (FLEXERIL) 10 MG tablet Take 10 mg by mouth 3 (three) times daily as needed for muscle spasms.      [name] dexamethasone (DECADRON) 4 MG tablet Take 2 tablets (8 mg total) by mouth every 12  (twelve) hours.  60 tablet  0  . docusate sodium (COLACE) 100 MG capsule Take 1 capsule (100 mg total) by mouth 2 (two) times daily as needed for constipation.  10 capsule  0  . everolimus (AFINITOR) 5 MG tablet Take 5 mg by mouth daily.      [name] exemestane (AROMASIN) 25 MG tablet Take 25 mg by mouth daily after breakfast.      . feeding supplement (ENSURE COMPLETE) LIQD Take 237 mLs by mouth 2 (two) times daily between meals.  24 Bottle  11  . ferrous sulfate 325 (65 FE) MG tablet Take 325 mg by mouth 3 (three) times daily after meals.      . fluconazole (DIFLUCAN) 100 MG tablet Take 1 tablet (100 mg total) by mouth daily.  7 tablet  0  . levofloxacin (LEVAQUIN) 500 MG tablet  Take 1 tablet (500 mg total) by mouth daily.  3 tablet  0  . ondansetron (ZOFRAN) 4 MG tablet Take 4 mg by mouth every 6 (six) hours.      [name] oxyCODONE (ROXICODONE) 5 MG immediate release tablet Take 3 tablets (15 mg total) by mouth every 6 (six) hours as needed for pain.  180 tablet  0  . OxyCODONE HCl ER 60 MG T12A Take 2 tablets by mouth 2 (two) times daily.  60 each  0  . pantoprazole (PROTONIX) 40 MG tablet Take 1 tablet (40 mg total) by mouth daily.  30 tablet  3  . sennosides-docusate sodium (SENOKOT-S) 8.6-50 MG tablet Take 1 tablet by mouth daily as needed for constipation.  20 tablet  0  . zolpidem (AMBIEN) 5 MG tablet Take 1 tablet (5 mg total) by mouth at bedtime as needed for sleep.  30 tablet  1  . [DISCONTINUED] Alum & Mag Hydroxide-Simeth (MAGIC MOUTHWASH W/LIDOCAINE) SOLN Take 5 mLs by mouth 3 (three) times daily as needed (take 15 minutes prior to meals.).  480 mL  0   No current facility-administered medications for this visit.    OBJECTIVE: Middle-aged [place] American woman who appears tired and weak   There were no vitals filed for this visit.   There is no weight on file to calculate BMI.    ECOG: 2 There were no vitals filed for this visit.  Exam today was deferred   LAB RESULTS: Lab Results   Component Value Date   WBC 8.3 08/22/2012   NEUTROABS 6.7* 08/22/2012   HGB 8.0* 08/22/2012   HCT 23.6* 08/22/2012   MCV 78.7* 08/22/2012   PLT 333 08/22/2012      Chemistry      Component Value Date/Time   NA 136 08/10/2012 0344   NA 142 08/03/2012 1609   K 3.9 08/10/2012 0344   K 3.2* 08/03/2012 1609   CL 100 08/10/2012 0344   CL 103 08/03/2012 1609   CO2 26 08/10/2012 0344   CO2 27 08/03/2012 1609   BUN 5* 08/10/2012 0344   BUN 9.3 08/03/2012 1609   CREATININE 0.60 08/10/2012 0344   CREATININE 0.9 08/03/2012 1609      Component Value Date/Time   CALCIUM 9.1 08/10/2012 0344   CALCIUM 9.4 08/03/2012 1609   ALKPHOS 224* 08/05/2012 0530   ALKPHOS 293* 08/03/2012 1609   AST 41* 08/05/2012 0530   AST 39* 08/03/2012 1609   ALT 13 08/05/2012 0530   ALT 19 08/03/2012 1609   BILITOT 0.5 08/05/2012 0530   BILITOT 0.45 08/03/2012 1609       Lab Results  Component Value Date   LABCA2 89* 06/09/2012    STUDIES:  Ct Chest W Contrast  08/04/2012  *RADIOLOGY REPORT*  Clinical Data:  Abdominal pain.  Metastatic breast cancer.  CT CHEST, ABDOMEN AND PELVIS WITH CONTRAST  Technique:  Multidetector CT imaging of the chest, abdomen and pelvis was performed following the standard protocol during bolus administration of intravenous contrast.  Contrast: OMNIPAQUE IOHEXOL 300 MG/ML  SOLN,  Comparison:  Chest CT 04/20/2012.  CT of the abdomen and pelvis 07/07/2012.  CT CHEST  Findings:  Mediastinum: Heart size is normal. There is no significant pericardial fluid, thickening or pericardial calcification.  There is some prominent but not enlarged nodal tissue in the low right paratracheal station which appears to represent a cluster of small lymph nodes.  Several other small borderline enlarged lymph nodes are noted in the  mediastinum, most notably in the subcarinal station.  No definite pathologic nodal enlargement is noted. Esophagus is unremarkable in appearance.  Lungs/Pleura: Evaluation of the lung parenchyma is limited by  extensive patient respiratory motion.  With this limitation in mind there is new peribronchovascular ground-glass attenuation air space disease in the left upper lobe, and to a lesser extent in the inferior segments of the left lower lobe, concerning for multilobar pneumonia.  No definite suspicious appearing pulmonary nodules or masses are confidently identified.  Moderate bilateral pleural effusions layer dependently and are relatively simple in appearance, however, there are some areas of pleural thickening and enhancement, best demonstrated on image 43 of series 2 in the posterior aspect of the right pleural cavity, suggesting that there may be malignant pleural involvement.  Musculoskeletal: Postoperative changes of modified radical right sided mastectomy with right axillary clips compatible with prior nodal dissection.  Innumerable mixed lytic and sclerotic lesions are again noted throughout the visualized axial and appendicular skeleton, most pronounced throughout the thoracic spine.  There is a new nondisplaced pathologic fracture through the posterior aspect of the right eleventh rib.  Postoperative changes of laminectomy are noted at its T9-T10. Enhancing soft tissue in the epidural space in the left side of the upper thoracic spine in the right side of the lower thoracic spine, concerning for additional sites of metastatic disease.  No definite pathologic vertebral body compression fractures are identified at this time.  IMPRESSION:  1. Enlarging moderate bilateral pleural effusions, with some pleural thickening and enhancement, suggesting malignant pleural metastases.  Sampling of pleural fluid may be warranted if clinically indicated. 2. Progression of metastatic disease to the bones, particularly throughout the thoracic spine, with a new nondisplaced pathologic fracture of the posterior aspect of the right eleventh rib. Enhancing soft tissue in the epidural space adjacent to both the upper and lower  thoracic spine is also noted, better demonstrated on recent MRI. 3.  New multifocal peribronchovascular air space disease throughout the left upper and lower lobe concerning for multilobar bronchopneumonia.  CT ABDOMEN AND PELVIS  Findings:  Abdomen/Pelvis: There are again innumerable hepatic nodules and masses, compatible with widespread metastatic disease, largest of which measures up to 10.8 x 9.8 cm in the right lobe of the liver (image 48 of series 2).  Overall, the size of these lesions appears very similar to the prior examination, if not slightly increased, but there is increasing central low attenuation within many of these lesions, suggesting internal areas of necrosis and vascular compromise.  Some lesions do appear slightly larger, including a small lesion in the right lobe of the liver on image 66 of series 2 which currently measures 13 mm (previously only 9 mm).  The appearance of the gallbladder, pancreas, spleen, bilateral adrenal glands in the right kidney is unremarkable.  Parenchymal thinning in the interpolar region of the left kidney, and a tiny 2 mm nonobstructive calculus in the interpolar left kidney are both unchanged.  A tiny volume of ascites in the anatomic pelvis is simple in appearance.  Uterus is heterogeneous in appearance with multifocal low attenuation and partially calcified lesions, likely represent fibroids.  Ovaries are unremarkable in appearance. Assessment of the anatomic pelvis is significantly limited by extensive beam hardening artifact from the patient's right sided hip prosthesis.  The urinary bladder is moderately distended without focal abnormalities.  No pneumoperitoneum.  No pathologic distension of small bowel.  No definite pathologic lymphadenopathy identified within the abdomen or pelvis.  Relatively large volume of  well formed stool throughout the colon may suggest constipation.  Musculoskeletal: Status post right total hip arthroplasty. Numerous mixed lytic and  sclerotic lesions are again noted throughout the visualized axial and appendicular skeleton, similar in appearance to prior examinations.  IMPRESSION:  1.  Overall, the burden of metastatic disease in the bones and the liver is very similar to the prior examination, with suggestion of slight progression in the liver, as above.  Importantly, many of the liver lesions do show areas of internal decreased enhancement, which could suggest developing areas of necrosis. 2.  New small volume of ascites. 3.  Moderate volume of well formed stool throughout the colon may suggest constipation. 4.  Additional incidental findings, as above.   Original Report Authenticated By: Trudie Reed, M.D.    Ct Chest W Contrast  08/04/2012  *RADIOLOGY REPORT*  Clinical Data:  Abdominal pain.  Metastatic breast cancer.  CT CHEST, ABDOMEN AND PELVIS WITH CONTRAST  Technique:  Multidetector CT imaging of the chest, abdomen and pelvis was performed following the standard protocol during bolus administration of intravenous contrast.  Contrast: OMNIPAQUE IOHEXOL 300 MG/ML  SOLN,  Comparison:  Chest CT 04/20/2012.  CT of the abdomen and pelvis 07/07/2012.  CT CHEST  Findings:  Mediastinum: Heart size is normal. There is no significant pericardial fluid, thickening or pericardial calcification.  There is some prominent but not enlarged nodal tissue in the low right paratracheal station which appears to represent a cluster of small lymph nodes.  Several other small borderline enlarged lymph nodes are noted in the mediastinum, most notably in the subcarinal station.  No definite pathologic nodal enlargement is noted. Esophagus is unremarkable in appearance.  Lungs/Pleura: Evaluation of the lung parenchyma is limited by extensive patient respiratory motion.  With this limitation in mind there is new peribronchovascular ground-glass attenuation air space disease in the left upper lobe, and to a lesser extent in the inferior segments of the  left lower lobe, concerning for multilobar pneumonia.  No definite suspicious appearing pulmonary nodules or masses are confidently identified.  Moderate bilateral pleural effusions layer dependently and are relatively simple in appearance, however, there are some areas of pleural thickening and enhancement, best demonstrated on image 43 of series 2 in the posterior aspect of the right pleural cavity, suggesting that there may be malignant pleural involvement.  Musculoskeletal: Postoperative changes of modified radical right sided mastectomy with right axillary clips compatible with prior nodal dissection.  Innumerable mixed lytic and sclerotic lesions are again noted throughout the visualized axial and appendicular skeleton, most pronounced throughout the thoracic spine.  There is a new nondisplaced pathologic fracture through the posterior aspect of the right eleventh rib.  Postoperative changes of laminectomy are noted at its T9-T10. Enhancing soft tissue in the epidural space in the left side of the upper thoracic spine in the right side of the lower thoracic spine, concerning for additional sites of metastatic disease.  No definite pathologic vertebral body compression fractures are identified at this time.  IMPRESSION:  1. Enlarging moderate bilateral pleural effusions, with some pleural thickening and enhancement, suggesting malignant pleural metastases.  Sampling of pleural fluid may be warranted if clinically indicated. 2. Progression of metastatic disease to the bones, particularly throughout the thoracic spine, with a new nondisplaced pathologic fracture of the posterior aspect of the right eleventh rib. Enhancing soft tissue in the epidural space adjacent to both the upper and lower thoracic spine is also noted, better demonstrated on recent MRI. 3.  New  multifocal peribronchovascular air space disease throughout the left upper and lower lobe concerning for multilobar bronchopneumonia.  CT ABDOMEN AND  PELVIS  Findings:  Abdomen/Pelvis: There are again innumerable hepatic nodules and masses, compatible with widespread metastatic disease, largest of which measures up to 10.8 x 9.8 cm in the right lobe of the liver (image 48 of series 2).  Overall, the size of these lesions appears very similar to the prior examination, if not slightly increased, but there is increasing central low attenuation within many of these lesions, suggesting internal areas of necrosis and vascular compromise.  Some lesions do appear slightly larger, including a small lesion in the right lobe of the liver on image 66 of series 2 which currently measures 13 mm (previously only 9 mm).  The appearance of the gallbladder, pancreas, spleen, bilateral adrenal glands in the right kidney is unremarkable.  Parenchymal thinning in the interpolar region of the left kidney, and a tiny 2 mm nonobstructive calculus in the interpolar left kidney are both unchanged.  A tiny volume of ascites in the anatomic pelvis is simple in appearance.  Uterus is heterogeneous in appearance with multifocal low attenuation and partially calcified lesions, likely represent fibroids.  Ovaries are unremarkable in appearance. Assessment of the anatomic pelvis is significantly limited by extensive beam hardening artifact from the patient's right sided hip prosthesis.  The urinary bladder is moderately distended without focal abnormalities.  No pneumoperitoneum.  No pathologic distension of small bowel.  No definite pathologic lymphadenopathy identified within the abdomen or pelvis.  Relatively large volume of well formed stool throughout the colon may suggest constipation.  Musculoskeletal: Status post right total hip arthroplasty. Numerous mixed lytic and sclerotic lesions are again noted throughout the visualized axial and appendicular skeleton, similar in appearance to prior examinations.  IMPRESSION:  1.  Overall, the burden of metastatic disease in the bones and the liver  is very similar to the prior examination, with suggestion of slight progression in the liver, as above.  Importantly, many of the liver lesions do show areas of internal decreased enhancement, which could suggest developing areas of necrosis. 2.  New small volume of ascites. 3.  Moderate volume of well formed stool throughout the colon may suggest constipation. 4.  Additional incidental findings, as above.   Original Report Authenticated By: Trudie Reed, M.D.    Ct Abdomen Pelvis W Contrast  08/04/2012  *RADIOLOGY REPORT*  Clinical Data:  Abdominal pain.  Metastatic breast cancer.  CT CHEST, ABDOMEN AND PELVIS WITH CONTRAST  Technique:  Multidetector CT imaging of the chest, abdomen and pelvis was performed following the standard protocol during bolus administration of intravenous contrast.  Contrast: OMNIPAQUE IOHEXOL 300 MG/ML  SOLN,  Comparison:  Chest CT 04/20/2012.  CT of the abdomen and pelvis 07/07/2012.  CT CHEST  Findings:  Mediastinum: Heart size is normal. There is no significant pericardial fluid, thickening or pericardial calcification.  There is some prominent but not enlarged nodal tissue in the low right paratracheal station which appears to represent a cluster of small lymph nodes.  Several other small borderline enlarged lymph nodes are noted in the mediastinum, most notably in the subcarinal station.  No definite pathologic nodal enlargement is noted. Esophagus is unremarkable in appearance.  Lungs/Pleura: Evaluation of the lung parenchyma is limited by extensive patient respiratory motion.  With this limitation in mind there is new peribronchovascular ground-glass attenuation air space disease in the left upper lobe, and to a lesser extent in the inferior segments  of the left lower lobe, concerning for multilobar pneumonia.  No definite suspicious appearing pulmonary nodules or masses are confidently identified.  Moderate bilateral pleural effusions layer dependently and are  relatively simple in appearance, however, there are some areas of pleural thickening and enhancement, best demonstrated on image 43 of series 2 in the posterior aspect of the right pleural cavity, suggesting that there may be malignant pleural involvement.  Musculoskeletal: Postoperative changes of modified radical right sided mastectomy with right axillary clips compatible with prior nodal dissection.  Innumerable mixed lytic and sclerotic lesions are again noted throughout the visualized axial and appendicular skeleton, most pronounced throughout the thoracic spine.  There is a new nondisplaced pathologic fracture through the posterior aspect of the right eleventh rib.  Postoperative changes of laminectomy are noted at its T9-T10. Enhancing soft tissue in the epidural space in the left side of the upper thoracic spine in the right side of the lower thoracic spine, concerning for additional sites of metastatic disease.  No definite pathologic vertebral body compression fractures are identified at this time.  IMPRESSION:  1. Enlarging moderate bilateral pleural effusions, with some pleural thickening and enhancement, suggesting malignant pleural metastases.  Sampling of pleural fluid may be warranted if clinically indicated. 2. Progression of metastatic disease to the bones, particularly throughout the thoracic spine, with a new nondisplaced pathologic fracture of the posterior aspect of the right eleventh rib. Enhancing soft tissue in the epidural space adjacent to both the upper and lower thoracic spine is also noted, better demonstrated on recent MRI. 3.  New multifocal peribronchovascular air space disease throughout the left upper and lower lobe concerning for multilobar bronchopneumonia.  CT ABDOMEN AND PELVIS  Findings:  Abdomen/Pelvis: There are again innumerable hepatic nodules and masses, compatible with widespread metastatic disease, largest of which measures up to 10.8 x 9.8 cm in the right lobe of the  liver (image 48 of series 2).  Overall, the size of these lesions appears very similar to the prior examination, if not slightly increased, but there is increasing central low attenuation within many of these lesions, suggesting internal areas of necrosis and vascular compromise.  Some lesions do appear slightly larger, including a small lesion in the right lobe of the liver on image 66 of series 2 which currently measures 13 mm (previously only 9 mm).  The appearance of the gallbladder, pancreas, spleen, bilateral adrenal glands in the right kidney is unremarkable.  Parenchymal thinning in the interpolar region of the left kidney, and a tiny 2 mm nonobstructive calculus in the interpolar left kidney are both unchanged.  A tiny volume of ascites in the anatomic pelvis is simple in appearance.  Uterus is heterogeneous in appearance with multifocal low attenuation and partially calcified lesions, likely represent fibroids.  Ovaries are unremarkable in appearance. Assessment of the anatomic pelvis is significantly limited by extensive beam hardening artifact from the patient's right sided hip prosthesis.  The urinary bladder is moderately distended without focal abnormalities.  No pneumoperitoneum.  No pathologic distension of small bowel.  No definite pathologic lymphadenopathy identified within the abdomen or pelvis.  Relatively large volume of well formed stool throughout the colon may suggest constipation.  Musculoskeletal: Status post right total hip arthroplasty. Numerous mixed lytic and sclerotic lesions are again noted throughout the visualized axial and appendicular skeleton, similar in appearance to prior examinations.  IMPRESSION:  1.  Overall, the burden of metastatic disease in the bones and the liver is very similar to the prior examination,  with suggestion of slight progression in the liver, as above.  Importantly, many of the liver lesions do show areas of internal decreased enhancement, which could  suggest developing areas of necrosis. 2.  New small volume of ascites. 3.  Moderate volume of well formed stool throughout the colon may suggest constipation. 4.  Additional incidental findings, as above.   Original Report Authenticated By: Trudie Reed, M.D.    ASSESSMENT:   47 y.o.  [place] woman    1. 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    2.Status post adjuvant chemotherapy July to November 2011, consisting of 4 cycles of dose dense doxorubicin and cyclophosphamide, followed by 4 of 12 planned weekly doses of paclitaxel given adjuvantly, discontinued secondary to peripheral neuropathy.    3. Status post radiation to the right chest wall, truncated due to problems with the patient missing appointments.   4. Tamoxifen started June of 2012, but discontinued due to nausea.    5. metastatic disease to liver and bone pathologically documented October of 2012, estrogen receptor 100% positive, progesterone receptor 7% positive, with no HER-2 amplification;  treatment in the metastatic setting has consisted of:   6.Faslodex started 03/16/2011, discontinued November 2013 with progression. Zoladex every 3 months also started on 03/16/2011, and monthly zoledronic acid started November 2011, the Zometa I currently held due to pending dental procedures and possible extractions.             7. Status post thoracic laminectomy, T9-T11, for impending cord compression on 03/21/2011, followed by irradiation to the spine completed November 2011  8. 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 s/p palliative radiation to the Right hip clivus to 30 Gy completed 04/07/2012  10.  Progression noted by  scans in November 2013, at which time Faslodex was discontinued. Patient was started on exemestane in early December, with everolimus started in mid December at 5 mg daily. Continues to receive Zoladex injections every 3 months, last given 05/13/2013..  11. Nondisplaced right femoral neck fracture, status post right hip arthroplasty on 05/04/2012 under the care of Dr.Matt [facility].  12. Hypercalcemia with elevated serum creatinine: resolved  13.  s/p palliative radiation therapy for cord compression completed 08/15/2012  14. Poorly controlled pain.       PLAN:  We spent the better part of today's half hour visit discussing pain issues. She understands that her pain medication use lately has been very irregular. On one occasion she lost her medications. She also does not take them as prescribed it takes more or less depending on what her pain as. On the one hand it is important for her pain to be well-controlled. On the other hand we cannot have an restricted Axis II pain medicine because that may lead to legal issues.  After much discussion today she agreed to increase the OxyContin to 60 mg 2 tablets twice daily (such 120 mg twice a day), to stop the Percocet, and instead to use OxyIR 5 mg tablets, 3 tablets every 6 hours as needed for pain. She understands that she was taking too many Percocets (up to 12 a day) and that the Tylenol in that medication can damage her liver. She also agrees to urine drug testing.  Today I wrote  her for 60 of the OxyContin and other and 80 of the OxyIR tablets, which should last her 2 weeks. She her ready scheduled to see me April 3. We'll continue to discuss this at the next visit.      esting.MAGRINAT,GUSTAV C    08/22/2012

## 2012-08-22 NOTE — Telephone Encounter (Signed)
Received call from pt stating she "is supposed to come in today for her last radiation treatment". Pt's last tx had been cancelled last week per  Dr Michell Heinrich; pt did not show for her radiation appts on 08/16/12, 08/17/12 and 08/18/12. Attempted to contact pt on 08/17/12 and left vm  w/no return calls from her. Radiation therapist tried to contact pt on 08/19/12, got message that subscriber could not accept any more calls.  Per Amy, RT the pt called earlier today stating "Dr Darnelle Catalan told her she needed to receive her last radiation tx". Informed pt that Dr Michell Heinrich is out of office until 08/25/12. Pt hung up on this RN before conversation was completed.

## 2012-08-23 ENCOUNTER — Other Ambulatory Visit: Payer: Self-pay | Admitting: Physician Assistant

## 2012-08-23 ENCOUNTER — Other Ambulatory Visit: Payer: Self-pay | Admitting: Oncology

## 2012-08-23 DIAGNOSIS — C787 Secondary malignant neoplasm of liver and intrahepatic bile duct: Secondary | ICD-10-CM

## 2012-08-23 DIAGNOSIS — C50919 Malignant neoplasm of unspecified site of unspecified female breast: Secondary | ICD-10-CM

## 2012-08-23 LAB — URINE DRUGS OF ABUSE SCREEN W ALC, ROUTINE (REF LAB)
Benzodiazepines.: NEGATIVE
Cocaine Metabolites: POSITIVE — AB
Creatinine,U: 103.8 mg/dL
Phencyclidine (PCP): NEGATIVE

## 2012-08-24 NOTE — Progress Notes (Signed)
  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   She was started urgently for cord compression.  She was compliant with treatment while and inpatient but had difficulty completing treatment.  She ultimately ahd missed over a week of treatment and was not able to finish her last fraction.  There were 24 elapsed days to receive 9 fractions.   Plan: The patient has completed radiation treatment. The patient will return to radiation oncology clinic for routine followup in one month. I advised them to call or return sooner if they have any questions or concerns related to their recovery or treatment. Dorthea has follow up with Dr. Darnelle Catalan as well.   ------------------------------------------------  [name], MD

## 2012-08-25 ENCOUNTER — Telehealth: Payer: Self-pay | Admitting: Oncology

## 2012-08-25 NOTE — Telephone Encounter (Signed)
Added lb to 4/3 f/u per 3/24 pof. Unable to reach pt or lm. Added comment to 3/31 appt note to send pt for new schedule.

## 2012-08-29 ENCOUNTER — Telehealth: Payer: Self-pay | Admitting: Oncology

## 2012-08-29 ENCOUNTER — Other Ambulatory Visit: Payer: Medicaid Other | Admitting: Lab

## 2012-08-29 NOTE — Telephone Encounter (Signed)
Fax records to [facility]

## 2012-09-01 ENCOUNTER — Emergency Department (HOSPITAL_COMMUNITY)
Admission: EM | Admit: 2012-09-01 | Discharge: 2012-09-01 | Disposition: A | Discharge status: Home / Self Care | Payer: Medicaid Other | Attending: Emergency Medicine | Admitting: Emergency Medicine

## 2012-09-01 ENCOUNTER — Other Ambulatory Visit: Payer: Medicaid Other | Admitting: Lab

## 2012-09-01 ENCOUNTER — Emergency Department (HOSPITAL_COMMUNITY): Payer: Medicaid Other

## 2012-09-01 ENCOUNTER — Ambulatory Visit: Payer: Medicaid Other | Admitting: Oncology

## 2012-09-01 ENCOUNTER — Encounter (HOSPITAL_COMMUNITY): Payer: Self-pay | Admitting: Emergency Medicine

## 2012-09-01 DIAGNOSIS — R531 Weakness: Secondary | ICD-10-CM

## 2012-09-01 DIAGNOSIS — Z87891 Personal history of nicotine dependence: Secondary | ICD-10-CM | POA: Insufficient documentation

## 2012-09-01 DIAGNOSIS — N390 Urinary tract infection, site not specified: Secondary | ICD-10-CM | POA: Insufficient documentation

## 2012-09-01 DIAGNOSIS — R52 Pain, unspecified: Secondary | ICD-10-CM | POA: Insufficient documentation

## 2012-09-01 DIAGNOSIS — Z87440 Personal history of urinary (tract) infections: Secondary | ICD-10-CM | POA: Insufficient documentation

## 2012-09-01 DIAGNOSIS — Z79899 Other long term (current) drug therapy: Secondary | ICD-10-CM | POA: Insufficient documentation

## 2012-09-01 DIAGNOSIS — K219 Gastro-esophageal reflux disease without esophagitis: Secondary | ICD-10-CM | POA: Insufficient documentation

## 2012-09-01 DIAGNOSIS — Z9221 Personal history of antineoplastic chemotherapy: Secondary | ICD-10-CM | POA: Insufficient documentation

## 2012-09-01 DIAGNOSIS — Z862 Personal history of diseases of the blood and blood-forming organs and certain disorders involving the immune mechanism: Secondary | ICD-10-CM | POA: Insufficient documentation

## 2012-09-01 DIAGNOSIS — Z923 Personal history of irradiation: Secondary | ICD-10-CM | POA: Insufficient documentation

## 2012-09-01 DIAGNOSIS — Z87448 Personal history of other diseases of urinary system: Secondary | ICD-10-CM | POA: Insufficient documentation

## 2012-09-01 DIAGNOSIS — R509 Fever, unspecified: Secondary | ICD-10-CM | POA: Insufficient documentation

## 2012-09-01 DIAGNOSIS — Z85841 Personal history of malignant neoplasm of brain: Secondary | ICD-10-CM | POA: Insufficient documentation

## 2012-09-01 DIAGNOSIS — Z8505 Personal history of malignant neoplasm of liver: Secondary | ICD-10-CM | POA: Insufficient documentation

## 2012-09-01 DIAGNOSIS — I10 Essential (primary) hypertension: Secondary | ICD-10-CM | POA: Insufficient documentation

## 2012-09-01 DIAGNOSIS — Z853 Personal history of malignant neoplasm of breast: Secondary | ICD-10-CM | POA: Insufficient documentation

## 2012-09-01 DIAGNOSIS — Z8639 Personal history of other endocrine, nutritional and metabolic disease: Secondary | ICD-10-CM | POA: Insufficient documentation

## 2012-09-01 DIAGNOSIS — R5381 Other malaise: Secondary | ICD-10-CM | POA: Insufficient documentation

## 2012-09-01 DIAGNOSIS — Z85830 Personal history of malignant neoplasm of bone: Secondary | ICD-10-CM | POA: Insufficient documentation

## 2012-09-01 LAB — CBC WITH DIFFERENTIAL/PLATELET
Basophils Relative: 0 % (ref 0–1)
Eosinophils Absolute: 0.1 10*3/uL (ref 0.0–0.7)
Lymphs Abs: 0.5 10*3/uL — ABNORMAL LOW (ref 0.7–4.0)
MCH: 26.5 pg (ref 26.0–34.0)
MCHC: 32.1 g/dL (ref 30.0–36.0)
MCV: 82.7 fL (ref 78.0–100.0)
Monocytes Absolute: 0.4 10*3/uL (ref 0.1–1.0)
Neutrophils Relative %: 85 % — ABNORMAL HIGH (ref 43–77)
Platelets: 307 10*3/uL (ref 150–400)

## 2012-09-01 LAB — COMPREHENSIVE METABOLIC PANEL
AST: 37 U/L (ref 0–37)
BUN: 6 mg/dL (ref 6–23)
CO2: 27 mEq/L (ref 19–32)
Calcium: 8.7 mg/dL (ref 8.4–10.5)
Creatinine, Ser: 0.66 mg/dL (ref 0.50–1.10)
GFR calc Af Amer: >90 mL/min (ref >90)
GFR calc non Af Amer: >90 mL/min (ref >90)
Total Bilirubin: 0.7 mg/dL (ref 0.3–1.2)

## 2012-09-01 LAB — URINALYSIS, ROUTINE W REFLEX MICROSCOPIC
Glucose, UA: NEGATIVE mg/dL
Ketones, ur: 40 mg/dL — AB
Nitrite: NEGATIVE
Protein, ur: NEGATIVE mg/dL
Urobilinogen, UA: 2 mg/dL — ABNORMAL HIGH (ref 0.0–1.0)

## 2012-09-01 LAB — URINE MICROSCOPIC-ADD ON

## 2012-09-01 LAB — LACTIC ACID, PLASMA: Lactic Acid, Venous: 1.2 mmol/L (ref 0.5–2.2)

## 2012-09-01 MED ORDER — HYDROMORPHONE HCL PF 1 MG/ML IJ SOLN
1.0000 mg | Freq: Once | INTRAMUSCULAR | Status: AC
  Administered 2012-09-01: 1 mg via INTRAVENOUS
  Filled 2012-09-01: qty 1

## 2012-09-01 MED ORDER — DEXTROSE 5 % IV SOLN
1.0000 g | Freq: Once | INTRAVENOUS | Status: AC
  Administered 2012-09-01: 1 g via INTRAVENOUS
  Filled 2012-09-01: qty 10

## 2012-09-01 MED ORDER — OXYCODONE HCL 5 MG PO TABS: 5.0000 mg | ORAL_TABLET | Freq: Four times a day (QID) | ORAL | Status: DC | PRN

## 2012-09-01 MED ORDER — CEPHALEXIN 500 MG PO CAPS: 500.0000 mg | ORAL_CAPSULE | Freq: Four times a day (QID) | ORAL | Status: DC

## 2012-09-01 MED ORDER — SODIUM CHLORIDE 0.9 % IV BOLUS (SEPSIS)
1000.0000 mL | Freq: Once | INTRAVENOUS | Status: AC
  Administered 2012-09-01: 1000 mL via INTRAVENOUS

## 2012-09-01 MED ORDER — KETOROLAC TROMETHAMINE 30 MG/ML IJ SOLN
30.0000 mg | Freq: Once | INTRAMUSCULAR | Status: AC
  Administered 2012-09-01: 30 mg via INTRAVENOUS
  Filled 2012-09-01: qty 1

## 2012-09-01 NOTE — ED Notes (Signed)
Patient is aware of the need for a urine sample.

## 2012-09-01 NOTE — ED Notes (Signed)
[id number]<HQ> Expected date:<BR> Expected time:<BR> Means of arrival:<BR> Comments:<BR> Hold for [facility]

## 2012-09-01 NOTE — ED Notes (Signed)
[id number]<VW> Expected date:09/01/12<BR> Expected time:<BR> Means of arrival:<BR> Comments:<BR> 39&#39;s female from home generalized weakness.

## 2012-09-01 NOTE — ED Provider Notes (Signed)
History     CSN: [id number]  Arrival date & time 09/01/12  4696   First MD Initiated Contact with Patient 09/01/12 1925      Chief Complaint  Patient presents with  . Weakness  . Generalized Body Aches    (Consider location/radiation/quality/duration/timing/severity/associated sxs/prior treatment) The history is provided by the patient.  [name] is a 47 y.o. female history of hypertension, GERD, breast cancer with metastasis to bone and liver here presenting with diffuse bodyaches. Patient said diffuse bodyaches started today. Denies any vomiting or diarrhea or cough. She does have subjective fever and low-grade temp 99 per EMS. She says she never had this pain before upon review of her records she had chronic pain and has been follow up with her oncologist and has been taking OxyContin and oxycodone. He supposed to see her oncologist today for refill but she didn't feel well so came to the ED.    Past Medical History  Diagnosis Date  . Hypertension   . GERD (gastroesophageal reflux disease)   . 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 Pyelonephritis 10/01/11  . Recurrent UTI (urinary tract infection) 10/01/11  . S/P chemotherapy, time since 4-12 weeks   . S/P radiation > 12 weeks 03/15/12- 04/07/12    right hip, clivus  . 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 Brain cancer     spread from breast ca  . Hypercalcemia 06/21/2012  . History of radiation therapy 08/06/10 -09/16/10    R supraclav fossa, R chest wall, R drain site    Past Surgical History  Procedure Laterality Date  . Cesarean section  1989; 2010  . Mastectomy  2011    right  . Back surgery  2012    "removed tumor,cancer, from my spine  . Port-a-cath removal  2012    right chest  . Portacath placement  2011    right  . Tubal ligation  2010  . Breast biopsy  2011    right  . Hip arthroplasty  05/04/2012    Procedure: ARTHROPLASTY BIPOLAR HIP;  Surgeon: Shelda Pal, MD;  Location: [facility];  Service: Orthopedics;  Laterality: Right;  RIGHT HIP HEMI ARTHROPLASTY     Family History  Problem Relation Age of Onset  . Cancer Brother 59    died of colon cancer at age of 52  . High blood pressure Mother     History  Substance Use Topics  . Smoking status: Former Smoker -- 0.25 packs/day for 31 years    Types: Cigarettes    Quit date: 04/26/2012  . Smokeless tobacco: Never Used  . Alcohol Use: No    OB History   Grav Para Term Preterm Abortions TAB SAB Ect Mult Living                  Review of Systems  Neurological: Positive for weakness.  All other systems reviewed and are negative.    Allergies  Review of patient's allergies indicates no known allergies.  Home Medications   Current Outpatient Rx  Name  Route  Sig  Dispense  Refill  . cyclobenzaprine (FLEXERIL) 10 MG tablet   Oral   Take 10 mg by mouth 3 (three) times daily as needed for muscle spasms.         [name] dexamethasone (DECADRON) 4 MG tablet   Oral   Take 2 tablets (8 mg total) by mouth  every 12 (twelve) hours.   60 tablet   0   . everolimus (AFINITOR) 5 MG tablet   Oral   Take 5 mg by mouth daily.         [name] exemestane (AROMASIN) 25 MG tablet   Oral   Take 25 mg by mouth daily after breakfast.         . feeding supplement (ENSURE COMPLETE) LIQD   Oral   Take 237 mLs by mouth 2 (two) times daily between meals.   24 Bottle   11   . ferrous sulfate 325 (65 FE) MG tablet   Oral   Take 325 mg by mouth 3 (three) times daily after meals.         . ondansetron (ZOFRAN) 4 MG tablet   Oral   Take 4 mg by mouth every 6 (six) hours.         [name] oxyCODONE (ROXICODONE) 5 MG immediate release tablet   Oral   Take 3 tablets (15 mg total) by mouth every 6 (six) hours as needed for pain.   180 tablet   0   . OxyCODONE HCl ER 60 MG T12A   Oral   Take 2 tablets by mouth 2 (two) times daily.   60 each   0   . pantoprazole (PROTONIX) 40 MG tablet   Oral    Take 1 tablet (40 mg total) by mouth daily.   30 tablet   3   . zolpidem (AMBIEN) 5 MG tablet   Oral   Take 1 tablet (5 mg total) by mouth at bedtime as needed for sleep.   30 tablet   1     BP 133/75  Pulse 104  Temp(Src) 98.7 F (37.1 C) (Oral)  Resp 26  SpO2 100%  Physical Exam  Nursing note and vitals reviewed. Constitutional: She is oriented to person, place, and time.  Chronically ill, tired, uncomfortable   HENT:  Head: Normocephalic.  Mouth/Throat: Oropharynx is clear and moist.  Eyes: Conjunctivae are normal. Pupils are equal, round, and reactive to light.  Neck: Normal range of motion. Neck supple.  No meningeal signs   Cardiovascular: Normal rate, regular rhythm and normal heart sounds.   Pulmonary/Chest: Effort normal and breath sounds normal. No respiratory distress. She has no wheezes. She has no rales.  Abdominal:  Firm, liver enlarged and tender (chronic from liver mets). ? Fluid wave with mild diffuse tenderness, no rebound.   Musculoskeletal: Normal range of motion.  Neurological: She is alert and oriented to person, place, and time.  Skin: Skin is warm and dry.  Psychiatric: She has a normal mood and affect. Her behavior is normal. Judgment and thought content normal.    ED Course  Procedures (including critical care time)  Labs Reviewed  CBC WITH DIFFERENTIAL - Abnormal; Notable for the following:    RBC 2.94 (*)    Hemoglobin 7.8 (*)    HCT 24.3 (*)    RDW 22.9 (*)    Neutrophils Relative 85 (*)    Lymphocytes Relative 8 (*)    Lymphs Abs 0.5 (*)    All other components within normal limits  COMPREHENSIVE METABOLIC PANEL - Abnormal; Notable for the following:    Albumin 2.6 (*)    Alkaline Phosphatase 335 (*)    All other components within normal limits  URINALYSIS, ROUTINE W REFLEX MICROSCOPIC - Abnormal; Notable for the following:    APPearance CLOUDY (*)    Ketones, ur 40 (*)  Urobilinogen, UA 2.0 (*)    Leukocytes, UA MODERATE  (*)    All other components within normal limits  URINE MICROSCOPIC-ADD ON - Abnormal; Notable for the following:    Squamous Epithelial / LPF MANY (*)    All other components within normal limits  URINE CULTURE  LACTIC ACID, PLASMA   Dg Abd Acute W/chest  09/01/2012  *RADIOLOGY REPORT*  Clinical Data: Weakness and generalized body aches.  ACUTE ABDOMEN SERIES (ABDOMEN 2 VIEW & CHEST 1 VIEW)  Comparison: 07/07/2012  Findings: Shallow inspiration.  Heart size and pulmonary vascularity are normal.  There is diffuse interstitial change in the perihilar regions with bronchial wall thickening suggesting chronic bronchitis.  No focal consolidation or airspace disease. No blunting of costophrenic angles.  No pneumothorax.  Mediastinal contours appear intact.  Postoperative changes of right mastectomy and surgical clips in the right axilla.  Stable appearance since previous chest.  Scattered gas and stool in the colon.  Bowel shadows are displaced inferiorly, suggesting enlarged liver. Extensive hepatic metastasis is noted on previous CT chest abdomen and pelvis from 08/04/2012. No small or large bowel distension.  No free intra-abdominal air. No abnormal air fluid levels.  Postoperative changes in the right hip.  Rounded calcifications in the pelvis suggest calcified fibroids.  There is heterogeneous sclerosis with mild expansile change suggested in the posterior right tenth and eleventh ribs consistent with bone metastasis as seen on previous CT scan.  IMPRESSION: Interstitial changes in the lungs probably due to chronic bronchitis.  No focal consolidation.  Nonobstructive bowel gas pattern.  Enlarged liver and right rib lesions consistent with known liver and bone metastases.   Original Report Authenticated By: Burman Nieves, M.D.      No diagnosis found.    MDM  CARIANNE TAIRA is a 47 y.o. female here with diffuse pain. I think its likely myalgias vs worsening chronic pain. Will do sepsis workup to  r/o infections. Will give pain meds and reassess.   10:40 PM Ab acute series showed no SBO, likely chronic changes. UA + UTI. She is loaded with IV rocephin in the ED. Will d/c home on keflex. Otherwise, not septic appearing. Will also refill short course of narcotics.         Richardean Canal, MD 09/01/12 [phone]

## 2012-09-01 NOTE — ED Notes (Signed)
Pt BIB EMS. Pt c/o generalized weakness and body pain which started today. Pt c/o fever of 99.0 today. Pt has hx of R breast mastectomy. Pt a/o x 3.

## 2012-09-01 NOTE — ED Notes (Signed)
Pt was able to ambulate to bathroom and back to room without any issues.

## 2012-09-02 ENCOUNTER — Telehealth: Payer: Self-pay | Admitting: Oncology

## 2012-09-03 ENCOUNTER — Other Ambulatory Visit: Payer: Self-pay | Admitting: Oncology

## 2012-09-03 ENCOUNTER — Encounter (HOSPITAL_COMMUNITY): Payer: Self-pay | Admitting: *Deleted

## 2012-09-03 ENCOUNTER — Emergency Department (HOSPITAL_COMMUNITY)
Admission: EM | Admit: 2012-09-03 | Discharge: 2012-09-03 | Disposition: A | Discharge status: Home / Self Care | Payer: Medicaid Other | Attending: Emergency Medicine | Admitting: Emergency Medicine

## 2012-09-03 DIAGNOSIS — IMO0002 Reserved for concepts with insufficient information to code with codable children: Secondary | ICD-10-CM | POA: Insufficient documentation

## 2012-09-03 DIAGNOSIS — I10 Essential (primary) hypertension: Secondary | ICD-10-CM | POA: Insufficient documentation

## 2012-09-03 DIAGNOSIS — Z87891 Personal history of nicotine dependence: Secondary | ICD-10-CM | POA: Insufficient documentation

## 2012-09-03 DIAGNOSIS — Z8639 Personal history of other endocrine, nutritional and metabolic disease: Secondary | ICD-10-CM | POA: Insufficient documentation

## 2012-09-03 DIAGNOSIS — C787 Secondary malignant neoplasm of liver and intrahepatic bile duct: Secondary | ICD-10-CM | POA: Insufficient documentation

## 2012-09-03 DIAGNOSIS — Z87440 Personal history of urinary (tract) infections: Secondary | ICD-10-CM | POA: Insufficient documentation

## 2012-09-03 DIAGNOSIS — Z923 Personal history of irradiation: Secondary | ICD-10-CM | POA: Insufficient documentation

## 2012-09-03 DIAGNOSIS — R1084 Generalized abdominal pain: Secondary | ICD-10-CM | POA: Insufficient documentation

## 2012-09-03 DIAGNOSIS — R52 Pain, unspecified: Secondary | ICD-10-CM | POA: Insufficient documentation

## 2012-09-03 DIAGNOSIS — Z79899 Other long term (current) drug therapy: Secondary | ICD-10-CM | POA: Insufficient documentation

## 2012-09-03 DIAGNOSIS — C7951 Secondary malignant neoplasm of bone: Secondary | ICD-10-CM | POA: Insufficient documentation

## 2012-09-03 DIAGNOSIS — Z8742 Personal history of other diseases of the female genital tract: Secondary | ICD-10-CM | POA: Insufficient documentation

## 2012-09-03 DIAGNOSIS — Z9010 Acquired absence of unspecified breast and nipple: Secondary | ICD-10-CM | POA: Insufficient documentation

## 2012-09-03 DIAGNOSIS — K219 Gastro-esophageal reflux disease without esophagitis: Secondary | ICD-10-CM | POA: Insufficient documentation

## 2012-09-03 DIAGNOSIS — G8929 Other chronic pain: Secondary | ICD-10-CM | POA: Insufficient documentation

## 2012-09-03 DIAGNOSIS — Z862 Personal history of diseases of the blood and blood-forming organs and certain disorders involving the immune mechanism: Secondary | ICD-10-CM | POA: Insufficient documentation

## 2012-09-03 DIAGNOSIS — Z853 Personal history of malignant neoplasm of breast: Secondary | ICD-10-CM | POA: Insufficient documentation

## 2012-09-03 DIAGNOSIS — Z85841 Personal history of malignant neoplasm of brain: Secondary | ICD-10-CM | POA: Insufficient documentation

## 2012-09-03 LAB — URINE CULTURE

## 2012-09-03 MED ORDER — KETOROLAC TROMETHAMINE 30 MG/ML IJ SOLN
30.0000 mg | Freq: Once | INTRAMUSCULAR | Status: AC
  Administered 2012-09-03: 30 mg via INTRAVENOUS
  Filled 2012-09-03: qty 1

## 2012-09-03 MED ORDER — HYDROMORPHONE HCL PF 1 MG/ML IJ SOLN
1.0000 mg | Freq: Once | INTRAMUSCULAR | Status: AC
  Administered 2012-09-03: 1 mg via INTRAVENOUS
  Filled 2012-09-03: qty 1

## 2012-09-03 MED ORDER — ONDANSETRON HCL 4 MG/2ML IJ SOLN
4.0000 mg | Freq: Once | INTRAMUSCULAR | Status: AC
  Administered 2012-09-03: 4 mg via INTRAVENOUS
  Filled 2012-09-03: qty 2

## 2012-09-03 MED ORDER — OXYCODONE HCL 5 MG PO TABS: 15.0000 mg | ORAL_TABLET | Freq: Four times a day (QID) | ORAL | Status: DC | PRN

## 2012-09-03 NOTE — ED Provider Notes (Signed)
History     CSN: [id number]  Arrival date & time 09/03/12  2130   First MD Initiated Contact with Patient 09/03/12 2022      Chief Complaint  Patient presents with  . Pain    (Consider location/radiation/quality/duration/timing/severity/associated sxs/prior treatment) HPI History provided by pt.   Level 5 caveat applies because patient is uncooperative.  Pt presents w/ to ED w/ chronic pain attributed to cancer.  States that the pain is severe and diffuse.  She wants a prescription for her pain medication that she ran out of this morning.  No known fever.  Has had a cough x 2 weeks.  No urinary sx.  Per prior chart, pt has h/o breast cancer w/ bone and liver mets.  Was seen in ED 2 days ago for acute on chronic pain, had a negative CXR, diagnosed w/ UTI and prescribed abx and 12 roxicodone.  Missed her Oncology appointment this week.  Pt states that she has been compliant w/ keflex.  Her next oncology appt is scheduled for 4/26.  Per [place] Controlled Substance Database, pt has been prescribed nearly 200 percocet as well as a 30 day supply of oxycontin over the last 3.5 weeks.   Past Medical History  Diagnosis Date  . Hypertension   . GERD (gastroesophageal reflux disease)   . 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 Pyelonephritis 10/01/11  . Recurrent UTI (urinary tract infection) 10/01/11  . S/P chemotherapy, time since 4-12 weeks   . S/P radiation > 12 weeks 03/15/12- 04/07/12    right hip, clivus  . 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 Brain cancer     spread from breast ca  . Hypercalcemia 06/21/2012  . History of radiation therapy 08/06/10 -09/16/10    R supraclav fossa, R chest wall, R drain site    Past Surgical History  Procedure Laterality Date  . Cesarean section  1989; 2010  . Mastectomy  2011    right  . Back surgery  2012    "removed tumor,cancer, from my spine  . Port-a-cath removal  2012    right chest  . Portacath placement   2011    right  . Tubal ligation  2010  . Breast biopsy  2011    right  . Hip arthroplasty  05/04/2012    Procedure: ARTHROPLASTY BIPOLAR HIP;  Surgeon: Shelda Pal, MD;  Location: [facility];  Service: Orthopedics;  Laterality: Right;  RIGHT HIP HEMI ARTHROPLASTY     Family History  Problem Relation Age of Onset  . Cancer Brother 87    died of colon cancer at age of 73  . High blood pressure Mother     History  Substance Use Topics  . Smoking status: Former Smoker -- 0.25 packs/day for 31 years    Types: Cigarettes    Quit date: 04/26/2012  . Smokeless tobacco: Never Used  . Alcohol Use: No    OB History   Grav Para Term Preterm Abortions TAB SAB Ect Mult Living                  Review of Systems  All other systems reviewed and are negative.    Allergies  Review of patient's allergies indicates no known allergies.  Home Medications   Current Outpatient Rx  Name  Route  Sig  Dispense  Refill  . cephALEXin (KEFLEX) 500 MG capsule   Oral  Take 1 capsule (500 mg total) by mouth 4 (four) times daily.   40 capsule   0   . cyclobenzaprine (FLEXERIL) 10 MG tablet   Oral   Take 10 mg by mouth 3 (three) times daily as needed for muscle spasms.         [name] dexamethasone (DECADRON) 4 MG tablet   Oral   Take 2 tablets (8 mg total) by mouth every 12 (twelve) hours.   60 tablet   0   . everolimus (AFINITOR) 5 MG tablet   Oral   Take 5 mg by mouth daily.         [name] exemestane (AROMASIN) 25 MG tablet   Oral   Take 25 mg by mouth daily after breakfast.         . feeding supplement (ENSURE COMPLETE) LIQD   Oral   Take 237 mLs by mouth 2 (two) times daily between meals.   24 Bottle   11   . ferrous sulfate 325 (65 FE) MG tablet   Oral   Take 325 mg by mouth 3 (three) times daily after meals.         [name] oxyCODONE (ROXICODONE) 5 MG immediate release tablet   Oral   Take 1 tablet (5 mg total) by mouth every 6 (six) hours as needed for pain.   12 tablet    0   . OxyCODONE HCl ER 60 MG T12A   Oral   Take 2 tablets by mouth 2 (two) times daily.   60 each   0   . pantoprazole (PROTONIX) 40 MG tablet   Oral   Take 1 tablet (40 mg total) by mouth daily.   30 tablet   3   . zolpidem (AMBIEN) 5 MG tablet   Oral   Take 1 tablet (5 mg total) by mouth at bedtime as needed for sleep.   30 tablet   1   . ondansetron (ZOFRAN) 4 MG tablet   Oral   Take 4 mg by mouth every 6 (six) hours.           BP 160/83  Pulse 112  Temp(Src) 98.7 F (37.1 C) (Oral)  Resp 18  SpO2 99%  Physical Exam  Nursing note and vitals reviewed. Constitutional: She is oriented to person, place, and time. She appears well-developed and well-nourished. No distress.  HENT:  Head: Normocephalic and atraumatic.  Eyes:  Normal appearance  Neck: Normal range of motion.  Cardiovascular: Normal rate and regular rhythm.   Pulmonary/Chest: Effort normal and breath sounds normal. No respiratory distress.  Abdominal: Soft. Bowel sounds are normal. She exhibits no mass. There is no rebound and no guarding.  Abdomen is firm and diffusely tender.   Genitourinary:  No CVA tenderness  Musculoskeletal: Normal range of motion.  Neurological: She is alert and oriented to person, place, and time.  Skin: Skin is warm and dry. No rash noted.  Psychiatric:  Pt mildly agitated and restless    ED Course  Procedures (including critical care time)  Labs Reviewed - No data to display Dg Abd Acute W/chest  09/01/2012  *RADIOLOGY REPORT*  Clinical Data: Weakness and generalized body aches.  ACUTE ABDOMEN SERIES (ABDOMEN 2 VIEW & CHEST 1 VIEW)  Comparison: 07/07/2012  Findings: Shallow inspiration.  Heart size and pulmonary vascularity are normal.  There is diffuse interstitial change in the perihilar regions with bronchial wall thickening suggesting chronic bronchitis.  No focal consolidation or airspace disease. No blunting of  costophrenic angles.  No pneumothorax.  Mediastinal  contours appear intact.  Postoperative changes of right mastectomy and surgical clips in the right axilla.  Stable appearance since previous chest.  Scattered gas and stool in the colon.  Bowel shadows are displaced inferiorly, suggesting enlarged liver. Extensive hepatic metastasis is noted on previous CT chest abdomen and pelvis from 08/04/2012. No small or large bowel distension.  No free intra-abdominal air. No abnormal air fluid levels.  Postoperative changes in the right hip.  Rounded calcifications in the pelvis suggest calcified fibroids.  There is heterogeneous sclerosis with mild expansile change suggested in the posterior right tenth and eleventh ribs consistent with bone metastasis as seen on previous CT scan.  IMPRESSION: Interstitial changes in the lungs probably due to chronic bronchitis.  No focal consolidation.  Nonobstructive bowel gas pattern.  Enlarged liver and right rib lesions consistent with known liver and bone metastases.   Original Report Authenticated By: Burman Nieves, M.D.      1. Chronic pain       MDM  Pt w/ h/o metastatic breast cancer and prescription drug abuse presents for second time in 2 days w/ diffuse, non-traumatic acute on chronic pain.  On exam, afebrile, uncomfortable/restless appearing, no respiratory distress, breath sounds nml, abd firm and diffusely tender (exam unchanged from 2 days ago and pt reports this is typical).  Pt received IVF, toradol and dilaudid.  Consulted Dr. Gaylyn Rong and he recommends giving her enough OxyIR to last her through the weekend and have her contact Dr. Darrall Dears office Monday am.  This information relayed to patient and she is agreeable.  D/c'd home w/ 15 roxicodone 5mg .  I e-mailed Dr. Darnelle Catalan requesting that patient be seen prior to scheduled f/u at the end of April and notified him of my concern that patient is abusing and/or selling her meds.          Otilio Miu, PA-C 09/04/12 2131

## 2012-09-03 NOTE — ED Notes (Signed)
Pt states she has pain all over from cancer,  States she had an appt with her doctor on Monday but was too sick to go to doctor,  Pt currently doesn't take chemo or radiation

## 2012-09-04 NOTE — ED Provider Notes (Signed)
  Medical screening examination/treatment/procedure(s) were performed by non-physician practitioner and as supervising physician I was immediately available for consultation/collaboration.    Gerhard Munch, MD 09/04/12 [phone]

## 2012-09-05 ENCOUNTER — Other Ambulatory Visit: Payer: Self-pay | Admitting: *Deleted

## 2012-09-05 ENCOUNTER — Other Ambulatory Visit: Payer: Self-pay | Admitting: Oncology

## 2012-09-05 ENCOUNTER — Ambulatory Visit (HOSPITAL_BASED_OUTPATIENT_CLINIC_OR_DEPARTMENT_OTHER): Payer: Medicaid Other | Admitting: Oncology

## 2012-09-05 VITALS — BP 162/93 | HR 121 | Temp 97.7°F | Resp 20 | Ht 63.0 in | Wt 119.4 lb

## 2012-09-05 DIAGNOSIS — C50919 Malignant neoplasm of unspecified site of unspecified female breast: Secondary | ICD-10-CM

## 2012-09-05 DIAGNOSIS — C7951 Secondary malignant neoplasm of bone: Secondary | ICD-10-CM

## 2012-09-05 DIAGNOSIS — C50519 Malignant neoplasm of lower-outer quadrant of unspecified female breast: Secondary | ICD-10-CM

## 2012-09-05 DIAGNOSIS — C787 Secondary malignant neoplasm of liver and intrahepatic bile duct: Secondary | ICD-10-CM

## 2012-09-05 DIAGNOSIS — Z5111 Encounter for antineoplastic chemotherapy: Secondary | ICD-10-CM

## 2012-09-05 DIAGNOSIS — G893 Neoplasm related pain (acute) (chronic): Secondary | ICD-10-CM

## 2012-09-05 DIAGNOSIS — C7952 Secondary malignant neoplasm of bone marrow: Secondary | ICD-10-CM

## 2012-09-05 MED ORDER — GOSERELIN ACETATE 10.8 MG ~~LOC~~ IMPL
10.8000 mg | DRUG_IMPLANT | Freq: Once | SUBCUTANEOUS | Status: AC
  Administered 2012-09-05: 10.8 mg via SUBCUTANEOUS
  Filled 2012-09-05: qty 10.8

## 2012-09-05 MED ORDER — OXYCODONE-ACETAMINOPHEN 10-325 MG PO TABS: 1.0000 | ORAL_TABLET | Freq: Four times a day (QID) | ORAL | Status: DC | PRN

## 2012-09-05 MED ORDER — OXYCODONE HCL ER 60 MG PO T12A: 1.0000 | EXTENDED_RELEASE_TABLET | Freq: Two times a day (BID) | ORAL | Status: DC

## 2012-09-05 NOTE — Telephone Encounter (Signed)
Pt called this AM stating need for refills on pain medication.  This RN inquired with pt use of prescriptions dispensed per this office on 3/24. [name] stated " I don't have anymore ". Per conversation this RN stated prescription for the oxycontin was to be used 2 times a day and she was given a 1 month supply.  [name] stated " I am in so much pain I have to take more than 2 a day ".  Per discussion this RN reviewed with pt recent ER visit and concerns that pt is not using medications as prescribed. Also discussed missed appointments that would allow this office to assess her pain and provide appropriate care.  [name] again stated her pain is not controlled well and she is using more medication a day then it is prescribed. She verbalized she knows she missed appt ) pt did leave message 4/4 requesting to be rescheduled ).   This RN reiterated concerns with pt not informing this office when she is having uncontrolled pain and using more medication- and then due to missed appointments she runs out of medication.  Validation given to pt that we know she is having pain due to progressive metastatic cancer but she needs to communicate with this office when pain is not controlled on prescribed regimen for appropriate documentation.  Plan at present is this note and ER documentation will be given to MD for review and appropriate recommendations.

## 2012-09-05 NOTE — Telephone Encounter (Signed)
See prior entry.

## 2012-09-05 NOTE — Progress Notes (Signed)
ID: [name]   DOB: May 30, 1966  MR#: [id number]  CSN#:[id number]  PCP: No primary provider on file.   HISTORY OF PRESENT ILLNESS: The patient delivered a child on 05/17/2009, after which she felt fullness in the right breast. She eventually brought this to her doctors attention and was scheduled for mammography and ultrasound on 03/08/2009. There were no prior exams for comparison. Mammography showed the breast to be dense, with a 5 cm obscured mass in the outer midportion of the right breast. Mass was easily palpable, and there was also a mass in the outer right periareolar region. They were suspicious microcalcifications in the outer midportion of the right breast, as well as within the mass itself. Ultrasound showed an extensive heterogeneous area, corresponding to the palpable abnormality with multiple solid and cystic foci. Overall, the mass measured in excess of 5 cm, in addition to cysts in the breast.  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 The tumors were ER +100%, PR +80%.  Bilateral breast MRI was obtained 03/22/2009. There was a large area of abnormal enhancement in the lower outer quadrant of the right breast measuring up to 9.7 cm. No abnormal appearing lymph nodes and no other masses in either breast.  Patient underwent definitive right mastectomy and sentinel lymph node sampling on 04/29/2009 under the care of Dr. Derrell Lolling. 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 Repeat prognostic panel showed tumor to be ER +93% and PR +51%. HER-2/neu was negative with a ratio of 0.71, with proliferation marker of 36%.  Patient was evaluated by Dr. Darnelle Catalan in January 2011 but failed to keep appointments until she was seen here again in July 2011. In July, she began adjuvant chemotherapy. She  received 4 cycles of dose dense doxorubicin and cyclophosphamide followed by 4 weekly doses of paclitaxel which was discontinued in November 2011 secondary to peripheral neuropathy. The patient then received radiation therapy to the right chest wall which was truncated due to problems with missing appointments.  Patient was started on tamoxifen briefly in June of 2012 which was discontinued due to nausea. The nausea continued, and an abdominal CT in October 2012 confirmed a growth in liver lesions. These were biopsied on 03/10/2011 and showed metastatic adenocarcinoma, still ER positive at 100%, PR +5%. Her subsequent treatments are as detailed below  INTERVAL HISTORY: [name] today for an unscheduled visit. She missed that appointment here last week because she was feeling "sick" that day. On the plus side, she tells me the lady she has been trying to get to come in and help her with her house cleaning and shopping has been approved.  REVIEW OF SYSTEMS: Degrees I continues to be in poorly controlled pain. The use of pain medicine is highly irregular as previously discussed. The medications do not cause her constipation, nausea, dizziness, or lethargy. She denies unusual headaches, visual changes, balance problems, shortness of breath, cough, phlegm production, rash, or fever. A detailed review of systems was overall stable.  PAST MEDICAL HISTORY: Past Medical History  Diagnosis Date  . Hypertension   . GERD (gastroesophageal reflux disease)   . 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 Pyelonephritis 10/01/11  . Recurrent UTI (urinary tract infection) 10/01/11  . S/P chemotherapy, time since 4-12 weeks   . S/P radiation > 12 weeks 03/15/12- 04/07/12  right hip, clivus  . 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 Brain cancer     spread from breast ca  . Hypercalcemia 06/21/2012  . History of radiation therapy 08/06/10 -09/16/10    R supraclav fossa, R  chest wall, R drain site    PAST SURGICAL HISTORY: Past Surgical History  Procedure Laterality Date  . Cesarean section  1989; 2010  . Mastectomy  2011    right  . Back surgery  2012    "removed tumor,cancer, from my spine  . Port-a-cath removal  2012    right chest  . Portacath placement  2011    right  . Tubal ligation  2010  . Breast biopsy  2011    right  . Hip arthroplasty  05/04/2012    Procedure: ARTHROPLASTY BIPOLAR HIP;  Surgeon: Shelda Pal, MD;  Location: [facility];  Service: Orthopedics;  Laterality: Right;  RIGHT HIP HEMI ARTHROPLASTY     FAMILY HISTORY The patient's father is alive at age 18.  The patient's mother is alive at age 69.  She has four sisters.  She had one brother who died from colon cancer at the age of 52.  There is no breast or ovarian cancer history in the family to her knowledge.  GYNECOLOGIC HISTORY: She is GX P2.  First pregnancy to term at age 54.    SOCIAL HISTORY: The patient is not employed.  She lives by herself (her small son whose name is [name] is currently staying with his dad in [place]).  Her first child, [name], is studying business in this area.  The patient has one grandchild.  She attends the [employer].    ADVANCED DIRECTIVES: not in place  HEALTH MAINTENANCE: History  Substance Use Topics  . Smoking status: Former Smoker -- 0.25 packs/day for 31 years    Types: Cigarettes    Quit date: 04/26/2012  . Smokeless tobacco: Never Used  . Alcohol Use: No     Colonoscopy:  PAP:  Bone density:  Lipid panel:  No Known Allergies  Current Outpatient Prescriptions  Medication Sig Dispense Refill  . cephALEXin (KEFLEX) 500 MG capsule Take 1 capsule (500 mg total) by mouth 4 (four) times daily.  40 capsule  0  . cyclobenzaprine (FLEXERIL) 10 MG tablet Take 10 mg by mouth 3 (three) times daily as needed for muscle spasms.      [name] dexamethasone (DECADRON) 4 MG tablet Take 2 tablets (8 mg total) by mouth every 12 (twelve)  hours.  60 tablet  0  . everolimus (AFINITOR) 5 MG tablet Take 5 mg by mouth daily.      [name] exemestane (AROMASIN) 25 MG tablet Take 25 mg by mouth daily after breakfast.      . feeding supplement (ENSURE COMPLETE) LIQD Take 237 mLs by mouth 2 (two) times daily between meals.  24 Bottle  11  . ferrous sulfate 325 (65 FE) MG tablet Take 325 mg by mouth 3 (three) times daily after meals.      . ondansetron (ZOFRAN) 4 MG tablet Take 4 mg by mouth every 6 (six) hours.      [name] oxyCODONE (ROXICODONE) 5 MG immediate release tablet Take 1 tablet (5 mg total) by mouth every 6 (six) hours as needed for pain.  12 tablet  0  . oxyCODONE (ROXICODONE) 5 MG immediate release tablet Take 3 tablets (15 mg total) by mouth every 6 (six) hours as  needed for pain.  15 tablet  0  . OxyCODONE HCl ER 60 MG T12A Take 2 tablets by mouth 2 (two) times daily.  60 each  0  . pantoprazole (PROTONIX) 40 MG tablet Take 1 tablet (40 mg total) by mouth daily.  30 tablet  3  . zolpidem (AMBIEN) 5 MG tablet Take 1 tablet (5 mg total) by mouth at bedtime as needed for sleep.  30 tablet  1  . [DISCONTINUED] Alum & Mag Hydroxide-Simeth (MAGIC MOUTHWASH W/LIDOCAINE) SOLN Take 5 mLs by mouth 3 (three) times daily as needed (take 15 minutes prior to meals.).  480 mL  0   No current facility-administered medications for this visit.    OBJECTIVE: Middle-aged [place] American woman who appears stressed   Filed Vitals:   09/05/12 1054  BP: 162/93  Pulse: 121  Temp: 97.7 F (36.5 Mullins)  Resp: 20     Body mass index is 21.16 kg/(m^2).    ECOG: 2 Filed Weights   09/05/12 1054  Weight: 119 lb 6.4 oz (54.159 kg)   Sclerae unicteric Oropharynx clear No cervical or supraclavicular adenopathy Lungs no rales or rhonchi Heart regular rate and rhythm Abd benign MSK no peripheral edema Neuro: nonfocal, well oriented, tearful affect Breasts: Deferred   LAB RESULTS: Lab Results  Component Value Date   WBC 6.3 09/01/2012   NEUTROABS 5.3  09/01/2012   HGB 7.8* 09/01/2012   HCT 24.3* 09/01/2012   MCV 82.7 09/01/2012   PLT 307 09/01/2012      Chemistry      Component Value Date/Time   NA 142 09/01/2012 2012   NA 142 08/03/2012 1609   K 3.6 09/01/2012 2012   K 3.2* 08/03/2012 1609   CL 104 09/01/2012 2012   CL 103 08/03/2012 1609   CO2 27 09/01/2012 2012   CO2 27 08/03/2012 1609   BUN 6 09/01/2012 2012   BUN 9.3 08/03/2012 1609   CREATININE 0.66 09/01/2012 2012   CREATININE 0.9 08/03/2012 1609      Component Value Date/Time   CALCIUM 8.7 09/01/2012 2012   CALCIUM 9.4 08/03/2012 1609   ALKPHOS 335* 09/01/2012 2012   ALKPHOS 293* 08/03/2012 1609   AST 37 09/01/2012 2012   AST 39* 08/03/2012 1609   ALT 15 09/01/2012 2012   ALT 19 08/03/2012 1609   BILITOT 0.7 09/01/2012 2012   BILITOT 0.45 08/03/2012 1609       Lab Results  Component Value Date   LABCA2 89* 06/09/2012    STUDIES:  Ct Chest W Contrast  08/04/2012  *RADIOLOGY REPORT*  Clinical Data:  Abdominal pain.  Metastatic breast cancer.  CT CHEST, ABDOMEN AND PELVIS WITH CONTRAST  Technique:  Multidetector CT imaging of the chest, abdomen and pelvis was performed following the standard protocol during bolus administration of intravenous contrast.  Contrast: OMNIPAQUE IOHEXOL 300 MG/ML  SOLN,  Comparison:  Chest CT 04/20/2012.  CT of the abdomen and pelvis 07/07/2012.  CT CHEST  Findings:  Mediastinum: Heart size is normal. There is no significant pericardial fluid, thickening or pericardial calcification.  There is some prominent but not enlarged nodal tissue in the low right paratracheal station which appears to represent a cluster of small lymph nodes.  Several other small borderline enlarged lymph nodes are noted in the mediastinum, most notably in the subcarinal station.  No definite pathologic nodal enlargement is noted. Esophagus is unremarkable in appearance.  Lungs/Pleura: Evaluation of the lung parenchyma is limited by extensive patient respiratory  motion.  With this limitation in mind there is new  peribronchovascular ground-glass attenuation air space disease in the left upper lobe, and to a lesser extent in the inferior segments of the left lower lobe, concerning for multilobar pneumonia.  No definite suspicious appearing pulmonary nodules or masses are confidently identified.  Moderate bilateral pleural effusions layer dependently and are relatively simple in appearance, however, there are some areas of pleural thickening and enhancement, best demonstrated on image 43 of series 2 in the posterior aspect of the right pleural cavity, suggesting that there may be malignant pleural involvement.  Musculoskeletal: Postoperative changes of modified radical right sided mastectomy with right axillary clips compatible with prior nodal dissection.  Innumerable mixed lytic and sclerotic lesions are again noted throughout the visualized axial and appendicular skeleton, most pronounced throughout the thoracic spine.  There is a new nondisplaced pathologic fracture through the posterior aspect of the right eleventh rib.  Postoperative changes of laminectomy are noted at its T9-T10. Enhancing soft tissue in the epidural space in the left side of the upper thoracic spine in the right side of the lower thoracic spine, concerning for additional sites of metastatic disease.  No definite pathologic vertebral body compression fractures are identified at this time.  IMPRESSION:  1. Enlarging moderate bilateral pleural effusions, with some pleural thickening and enhancement, suggesting malignant pleural metastases.  Sampling of pleural fluid may be warranted if clinically indicated. 2. Progression of metastatic disease to the bones, particularly throughout the thoracic spine, with a new nondisplaced pathologic fracture of the posterior aspect of the right eleventh rib. Enhancing soft tissue in the epidural space adjacent to both the upper and lower thoracic spine is also noted, better demonstrated on recent MRI. 3.  New multifocal  peribronchovascular air space disease throughout the left upper and lower lobe concerning for multilobar bronchopneumonia.  CT ABDOMEN AND PELVIS  Findings:  Abdomen/Pelvis: There are again innumerable hepatic nodules and masses, compatible with widespread metastatic disease, largest of which measures up to 10.8 x 9.8 cm in the right lobe of the liver (image 48 of series 2).  Overall, the size of these lesions appears very similar to the prior examination, if not slightly increased, but there is increasing central low attenuation within many of these lesions, suggesting internal areas of necrosis and vascular compromise.  Some lesions do appear slightly larger, including a small lesion in the right lobe of the liver on image 66 of series 2 which currently measures 13 mm (previously only 9 mm).  The appearance of the gallbladder, pancreas, spleen, bilateral adrenal glands in the right kidney is unremarkable.  Parenchymal thinning in the interpolar region of the left kidney, and a tiny 2 mm nonobstructive calculus in the interpolar left kidney are both unchanged.  A tiny volume of ascites in the anatomic pelvis is simple in appearance.  Uterus is heterogeneous in appearance with multifocal low attenuation and partially calcified lesions, likely represent fibroids.  Ovaries are unremarkable in appearance. Assessment of the anatomic pelvis is significantly limited by extensive beam hardening artifact from the patient's right sided hip prosthesis.  The urinary bladder is moderately distended without focal abnormalities.  No pneumoperitoneum.  No pathologic distension of small bowel.  No definite pathologic lymphadenopathy identified within the abdomen or pelvis.  Relatively large volume of well formed stool throughout the colon may suggest constipation.  Musculoskeletal: Status post right total hip arthroplasty. Numerous mixed lytic and sclerotic lesions are again noted throughout the visualized axial and appendicular  skeleton, similar in appearance to prior examinations.  IMPRESSION:  1.  Overall, the burden of metastatic disease in the bones and the liver is very similar to the prior examination, with suggestion of slight progression in the liver, as above.  Importantly, many of the liver lesions do show areas of internal decreased enhancement, which could suggest developing areas of necrosis. 2.  New small volume of ascites. 3.  Moderate volume of well formed stool throughout the colon may suggest constipation. 4.  Additional incidental findings, as above.   Original Report Authenticated By: Trudie Reed, M.D.     ASSESSMENT:   47 y.o.  [place] woman    1. 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    2.Status post adjuvant chemotherapy July to November 2011, consisting of 4 cycles of dose dense doxorubicin and cyclophosphamide, followed by 4 of 12 planned weekly doses of paclitaxel given adjuvantly, discontinued secondary to peripheral neuropathy.    3. Status post radiation to the right chest wall, truncated due to problems with the patient missing appointments.   4. Tamoxifen started June of 2012, but discontinued due to nausea.    5. metastatic disease to liver and bone pathologically documented October of 2012, estrogen receptor 100% positive, progesterone receptor 7% positive, with no HER-2 amplification;  treatment in the metastatic setting has consisted of:   6.Faslodex started 03/16/2011, discontinued November 2013 with progression. Zoladex every 3 months also started on 03/16/2011, and monthly zoledronic acid started November 2011, the Zometa I currently held due to pending dental procedures and possible extractions.             7. Status post thoracic laminectomy, T9-T11, for impending cord compression  on 03/21/2011, followed by irradiation to the spine completed November 2011  8. 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 s/p palliative radiation to the Right hip clivus to 30 Gy completed 04/07/2012  10.  Progression noted by scans in November 2013, at which time Faslodex was discontinued. Patient was started on exemestane in early December, with everolimus started in mid December at 5 mg daily. Continues to receive Zoladex injections every 3 months, last given 09/05/2012.  11. Nondisplaced right femoral neck fracture, status post right hip arthroplasty on 05/04/2012 under the care of Dr[facility].  12. Hypercalcemia with elevated serum creatinine: resolved  13.  s/p palliative radiation therapy for cord compression completed 08/15/2012  14. Poorly controlled pain.       PLAN:  [name] does have metastatic breast cancer and I have no doubt that it causes her pain. 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  Today we discussed this in detail and came up with an agreement. This is scanned separately. We are going to be checking a drug screen every Monday, and reviewing results on Tuesdays. If it isn't clean, except for the oxycodone, then she understands we are not going to be able to give her narcotics that week. She will have to manage her pain with nonnarcotic medications. If the drug screen is clear however, then she will receive enough for one week which currently means OxyContin 60  mg 14 tablets and oxycodone with APAP, 10/325 one to 2 tablets up to 4 times a day as needed (a total of 56 tablets maximum per week.).   I hope with this understanding issues regarding narcotic use and pain control will become more regularized, and  we will be able to concentrate on her breast cancer issues. In that regard, we are restarting her on everolimus at a lower dose, namely at 5 mg daily, to take together with her exemestane. She received her goserelin today and will receive it again in 3 months. She will see [place] again in April 29, and we plan to restage her in about 3 months. She knows to call for other problems that may develop before the next visit here.    [name]    09/05/2012

## 2012-09-06 ENCOUNTER — Telehealth: Payer: Self-pay | Admitting: Oncology

## 2012-09-06 NOTE — Telephone Encounter (Signed)
Letter sent to patient from Dr. Magrinat. °

## 2012-09-12 ENCOUNTER — Other Ambulatory Visit: Payer: Medicaid Other | Admitting: Lab

## 2012-09-13 ENCOUNTER — Telehealth: Payer: Self-pay | Admitting: *Deleted

## 2012-09-13 ENCOUNTER — Other Ambulatory Visit (HOSPITAL_BASED_OUTPATIENT_CLINIC_OR_DEPARTMENT_OTHER): Payer: Medicaid Other | Admitting: Lab

## 2012-09-13 DIAGNOSIS — C50919 Malignant neoplasm of unspecified site of unspecified female breast: Secondary | ICD-10-CM

## 2012-09-13 DIAGNOSIS — C787 Secondary malignant neoplasm of liver and intrahepatic bile duct: Secondary | ICD-10-CM

## 2012-09-13 DIAGNOSIS — C50519 Malignant neoplasm of lower-outer quadrant of unspecified female breast: Secondary | ICD-10-CM

## 2012-09-13 DIAGNOSIS — C50419 Malignant neoplasm of upper-outer quadrant of unspecified female breast: Secondary | ICD-10-CM

## 2012-09-13 LAB — COMPREHENSIVE METABOLIC PANEL (CC13)
ALT: 41 U/L (ref 0–55)
AST: 53 U/L — ABNORMAL HIGH (ref 5–34)
CO2: 26 mEq/L (ref 22–29)
Calcium: 8.9 mg/dL (ref 8.4–10.4)
Chloride: 102 mEq/L (ref 98–107)
Creatinine: 0.7 mg/dL (ref 0.6–1.1)
Potassium: 3.6 mEq/L (ref 3.5–5.1)
Sodium: 139 mEq/L (ref 136–145)
Total Protein: 6.3 g/dL — ABNORMAL LOW (ref 6.4–8.3)

## 2012-09-13 LAB — CBC WITH DIFFERENTIAL/PLATELET
BASO%: 0.3 % (ref 0.0–2.0)
EOS%: 0.6 % (ref 0.0–7.0)
HCT: 24.6 % — ABNORMAL LOW (ref 34.8–46.6)
LYMPH%: 11.8 % — ABNORMAL LOW (ref 14.0–49.7)
MCH: 25.8 pg (ref 25.1–34.0)
MCHC: 31.3 g/dL — ABNORMAL LOW (ref 31.5–36.0)
MONO#: 0.8 10*3/uL (ref 0.1–0.9)
NEUT%: 74.9 % (ref 38.4–76.8)
RBC: 2.98 10*6/uL — ABNORMAL LOW (ref 3.70–5.45)
WBC: 6.4 10*3/uL (ref 3.9–10.3)
lymph#: 0.8 10*3/uL — ABNORMAL LOW (ref 0.9–3.3)
nRBC: 3 % — ABNORMAL HIGH (ref 0–0)

## 2012-09-13 NOTE — Telephone Encounter (Signed)
Pt called to rs her missed appt from 09/12/12. gv appt d/t for 09/13/12@1 :30pm.. She is aware.Marland Kitchentd

## 2012-09-14 ENCOUNTER — Other Ambulatory Visit: Payer: Self-pay | Admitting: *Deleted

## 2012-09-14 DIAGNOSIS — C50919 Malignant neoplasm of unspecified site of unspecified female breast: Secondary | ICD-10-CM

## 2012-09-14 LAB — OXYCODONE SCREEN, UA, RFLX CONFIRM: Oxycodone Screen, Ur: NEGATIVE ng/mL

## 2012-09-14 LAB — URINE DRUGS OF ABUSE SCREEN W ALC, ROUTINE (REF LAB)
Barbiturate Quant, Ur: NEGATIVE
Creatinine,U: 65.9 mg/dL
Opiate Screen, Urine: NEGATIVE
Phencyclidine (PCP): NEGATIVE
Propoxyphene: NEGATIVE

## 2012-09-14 MED ORDER — CYCLOBENZAPRINE HCL 10 MG PO TABS: 10.0000 mg | ORAL_TABLET | Freq: Three times a day (TID) | ORAL | Status: DC | PRN

## 2012-09-14 MED ORDER — OXYCODONE-ACETAMINOPHEN 10-325 MG PO TABS: 1.0000 | ORAL_TABLET | Freq: Four times a day (QID) | ORAL | Status: DC | PRN

## 2012-09-14 MED ORDER — OXYCODONE HCL ER 60 MG PO T12A: 1.0000 | EXTENDED_RELEASE_TABLET | Freq: Two times a day (BID) | ORAL | Status: DC

## 2012-09-14 MED ORDER — ONDANSETRON HCL 4 MG PO TABS: 4.0000 mg | ORAL_TABLET | Freq: Four times a day (QID) | ORAL | Status: DC

## 2012-09-14 NOTE — Telephone Encounter (Signed)
OK to refill Flexeril and Zofran per Dr Darnelle Catalan

## 2012-09-19 ENCOUNTER — Other Ambulatory Visit: Payer: Medicaid Other | Admitting: Lab

## 2012-09-19 ENCOUNTER — Other Ambulatory Visit: Payer: Self-pay | Admitting: *Deleted

## 2012-09-19 DIAGNOSIS — G893 Neoplasm related pain (acute) (chronic): Secondary | ICD-10-CM

## 2012-09-19 MED ORDER — FLUCONAZOLE 100 MG PO TABS: 100.0000 mg | ORAL_TABLET | Freq: Every day | ORAL | Status: DC

## 2012-09-20 ENCOUNTER — Other Ambulatory Visit: Payer: Self-pay | Admitting: *Deleted

## 2012-09-20 ENCOUNTER — Telehealth: Payer: Self-pay | Admitting: *Deleted

## 2012-09-20 DIAGNOSIS — C50919 Malignant neoplasm of unspecified site of unspecified female breast: Secondary | ICD-10-CM

## 2012-09-20 LAB — URINE DRUGS OF ABUSE SCREEN W ALC, ROUTINE (REF LAB)
Amphetamine Screen, Ur: NEGATIVE
Barbiturate Quant, Ur: NEGATIVE
Benzodiazepines.: NEGATIVE
Cocaine Metabolites: NEGATIVE
Creatinine,U: 237.1 mg/dL
Marijuana Metabolite: NEGATIVE
Methadone: NEGATIVE
Opiate Screen, Urine: NEGATIVE
Phencyclidine (PCP): NEGATIVE
Propoxyphene: NEGATIVE

## 2012-09-20 LAB — OXYCODONE SCREEN, UA, RFLX CONFIRM: Oxycodone Screen, Ur: NEGATIVE ng/mL

## 2012-09-20 MED ORDER — OXYCODONE-ACETAMINOPHEN 10-325 MG PO TABS: 1.0000 | ORAL_TABLET | Freq: Four times a day (QID) | ORAL | Status: DC | PRN

## 2012-09-20 MED ORDER — OXYCODONE HCL ER 60 MG PO T12A: 1.0000 | EXTENDED_RELEASE_TABLET | Freq: Two times a day (BID) | ORAL | Status: DC

## 2012-09-20 NOTE — Telephone Encounter (Signed)
This RN left message on pt's identified VM requesting a return call regarding MD request for pt to resume [employer] 5mg . This RN called [facility] pharmacy where she obtained before and was informed pt has refills on drug with last dispense in January.

## 2012-09-21 ENCOUNTER — Telehealth: Payer: Self-pay | Admitting: *Deleted

## 2012-09-21 ENCOUNTER — Other Ambulatory Visit: Payer: Self-pay | Admitting: *Deleted

## 2012-09-21 ENCOUNTER — Encounter: Payer: Self-pay | Admitting: Radiation Oncology

## 2012-09-21 DIAGNOSIS — C50919 Malignant neoplasm of unspecified site of unspecified female breast: Secondary | ICD-10-CM

## 2012-09-21 DIAGNOSIS — Z923 Personal history of irradiation: Secondary | ICD-10-CM | POA: Insufficient documentation

## 2012-09-21 MED ORDER — OXYCODONE-ACETAMINOPHEN 10-325 MG PO TABS: 1.0000 | ORAL_TABLET | Freq: Four times a day (QID) | ORAL | Status: DC | PRN

## 2012-09-21 NOTE — Telephone Encounter (Signed)
Received call from daughter Lysle Rubens stating we needed to call [facility] because they are stating it is too early to refill her medications.   This RN called and spoke with Aneta Mins at [facility] who stated that when patient picked up her Rx for oxycontin on 09/15/12 he gave her 20 that he owed her from a previous Rx and the percocet was written for #56 1 tab q 6 hours which would be a 2 week supply.  Per Dr. Darnelle Catalan change directions to 1-2 tablets every 6 hours as needed.  This was relayed to the pharmacy.  Informed patient that Rx has been changed.

## 2012-09-22 ENCOUNTER — Ambulatory Visit: Payer: Medicaid Other | Attending: Radiation Oncology | Admitting: Radiation Oncology

## 2012-09-27 ENCOUNTER — Encounter: Payer: Self-pay | Admitting: Physician Assistant

## 2012-09-27 ENCOUNTER — Other Ambulatory Visit: Payer: Self-pay | Admitting: Physician Assistant

## 2012-09-27 ENCOUNTER — Ambulatory Visit (HOSPITAL_BASED_OUTPATIENT_CLINIC_OR_DEPARTMENT_OTHER): Payer: Medicaid Other | Admitting: Lab

## 2012-09-27 ENCOUNTER — Ambulatory Visit (HOSPITAL_BASED_OUTPATIENT_CLINIC_OR_DEPARTMENT_OTHER): Payer: Medicaid Other | Admitting: Physician Assistant

## 2012-09-27 ENCOUNTER — Encounter: Payer: Self-pay | Admitting: *Deleted

## 2012-09-27 ENCOUNTER — Telehealth: Payer: Self-pay | Admitting: Oncology

## 2012-09-27 ENCOUNTER — Other Ambulatory Visit: Payer: Medicaid Other | Admitting: Lab

## 2012-09-27 ENCOUNTER — Ambulatory Visit (HOSPITAL_COMMUNITY)
Admission: RE | Admit: 2012-09-27 | Discharge: 2012-09-27 | Disposition: A | Discharge status: Home / Self Care | Payer: Medicaid Other | Source: Ambulatory Visit | Attending: Physician Assistant | Admitting: Physician Assistant

## 2012-09-27 VITALS — BP 130/73 | HR 112 | Temp 98.0°F | Resp 20 | Ht 63.0 in | Wt 115.5 lb

## 2012-09-27 DIAGNOSIS — M79609 Pain in unspecified limb: Secondary | ICD-10-CM | POA: Insufficient documentation

## 2012-09-27 DIAGNOSIS — C787 Secondary malignant neoplasm of liver and intrahepatic bile duct: Secondary | ICD-10-CM

## 2012-09-27 DIAGNOSIS — K5900 Constipation, unspecified: Secondary | ICD-10-CM

## 2012-09-27 DIAGNOSIS — D649 Anemia, unspecified: Secondary | ICD-10-CM

## 2012-09-27 DIAGNOSIS — C7951 Secondary malignant neoplasm of bone: Secondary | ICD-10-CM

## 2012-09-27 DIAGNOSIS — R109 Unspecified abdominal pain: Secondary | ICD-10-CM

## 2012-09-27 DIAGNOSIS — C50919 Malignant neoplasm of unspecified site of unspecified female breast: Secondary | ICD-10-CM | POA: Insufficient documentation

## 2012-09-27 DIAGNOSIS — C50519 Malignant neoplasm of lower-outer quadrant of unspecified female breast: Secondary | ICD-10-CM

## 2012-09-27 DIAGNOSIS — M25559 Pain in unspecified hip: Secondary | ICD-10-CM | POA: Insufficient documentation

## 2012-09-27 DIAGNOSIS — G893 Neoplasm related pain (acute) (chronic): Secondary | ICD-10-CM

## 2012-09-27 DIAGNOSIS — C7952 Secondary malignant neoplasm of bone marrow: Secondary | ICD-10-CM | POA: Insufficient documentation

## 2012-09-27 LAB — CBC WITH DIFFERENTIAL/PLATELET
Basophils Absolute: 0 10*3/uL (ref 0.0–0.1)
EOS%: 0.8 % (ref 0.0–7.0)
Eosinophils Absolute: 0 10*3/uL (ref 0.0–0.5)
HGB: 8 g/dL — ABNORMAL LOW (ref 11.6–15.9)
LYMPH%: 11.6 % — ABNORMAL LOW (ref 14.0–49.7)
MCH: 26.8 pg (ref 25.1–34.0)
MCV: 83 fL (ref 79.5–101.0)
MONO%: 6.2 % (ref 0.0–14.0)
NEUT#: 4.6 10*3/uL (ref 1.5–6.5)
NEUT%: 81.1 % — ABNORMAL HIGH (ref 38.4–76.8)
Platelets: 445 10*3/uL — ABNORMAL HIGH (ref 145–400)

## 2012-09-27 LAB — COMPREHENSIVE METABOLIC PANEL (CC13)
Albumin: 2.5 g/dL — ABNORMAL LOW (ref 3.5–5.0)
Alkaline Phosphatase: 695 U/L — ABNORMAL HIGH (ref 40–150)
BUN: 7.1 mg/dL (ref 7.0–26.0)
Creatinine: 0.7 mg/dL (ref 0.6–1.1)
Glucose: 122 mg/dl — ABNORMAL HIGH (ref 70–99)
Total Bilirubin: 0.61 mg/dL (ref 0.20–1.20)

## 2012-09-27 NOTE — Progress Notes (Signed)
CSW received call from patient stating she had no transportation to appointment today.  Patient states she has not used [facility] for a long time and chose to stop [employer].  Patient desiring cab voucher to get to appointment today.  CSW explained patient no longer able to use vouchers, due to financial costs and her ability to utilize [employer] transportation.  Patient appeared very upset by phone and stated she "has to get to appointment to get pain meds" and discussed otherwise going to ED.  CSW attempted to deescalate patient and listen to patients concerns.  PATIENT NOT ELIGIBLE FOR CAB VOUCHERS.  Kathrin Penner, MSW, LCSW Clinical Social Worker [facility] [phone]

## 2012-09-27 NOTE — Progress Notes (Signed)
ID: [name]   DOB: Nov 30, 1965  MR#: [id number]  WGN#:[id number]  PCP: No primary provider on file.   HISTORY OF PRESENT ILLNESS: The patient delivered a child on 05/17/2009, after which she felt fullness in the right breast. She eventually brought this to her doctors attention and was scheduled for mammography and ultrasound on 03/08/2009. There were no prior exams for comparison. Mammography showed the breast to be dense, with a 5 cm obscured mass in the outer midportion of the right breast. Mass was easily palpable, and there was also a mass in the outer right periareolar region. They were suspicious microcalcifications in the outer midportion of the right breast, as well as within the mass itself. Ultrasound showed an extensive heterogeneous area, corresponding to the palpable abnormality with multiple solid and cystic foci. Overall, the mass measured in excess of 5 cm, in addition to cysts in the breast.  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 The tumors were ER +100%, PR +80%.  Bilateral breast MRI was obtained 03/22/2009. There was a large area of abnormal enhancement in the lower outer quadrant of the right breast measuring up to 9.7 cm. No abnormal appearing lymph nodes and no other masses in either breast.  Patient underwent definitive right mastectomy and sentinel lymph node sampling on 04/29/2009 under the care of Dr. Derrell Lolling. 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 Repeat prognostic panel showed tumor to be ER +93% and PR +51%. HER-2/neu was negative with a ratio of 0.71, with proliferation marker of 36%.  Patient was evaluated by Dr. Darnelle Catalan in January 2011 but failed to keep appointments until she was seen here again in July 2011. In July, she began adjuvant chemotherapy. She  received 4 cycles of dose dense doxorubicin and cyclophosphamide followed by 4 weekly doses of paclitaxel which was discontinued in November 2011 secondary to peripheral neuropathy. The patient then received radiation therapy to the right chest wall which was truncated due to problems with missing appointments.  Patient was started on tamoxifen briefly in June of 2012 which was discontinued due to nausea. The nausea continued, and an abdominal CT in October 2012 confirmed a growth in liver lesions. These were biopsied on 03/10/2011 and showed metastatic adenocarcinoma, still ER positive at 100%, PR +5%. Her subsequent treatments are as detailed below  INTERVAL HISTORY: Tiwanna returns today for followup of her metastatic breast carcinoma. Per her visit with Dr. Darnelle Catalan on 09/05/2012, we are checking Sylvanna's urine for drug abuse on a weekly basis, and our refilling her pain prescriptions weekly, once those results are obtained.   Daisee continues to have quite a bit of pain. She continues to have some pain occasionally in the right hip. Most recently, however, she has had significant pain in the left leg which is limiting her mobility. She is ambulating with a cane.  Mackensie also has some abdominal pain and "bloating". She tells me she has had no significant bowel movement approximately 2 weeks. She is passing some gas. She's been utilizing stool softeners and glycerin suppositories with minimal relief. Her appetite is decreased. She has occasional nausea, but fortunately no emesis.  REVIEW OF SYSTEMS: Jayelyn denies any fevers or chills. She notes no skin changes, but does have some generalized itching. She has some shortness of breath with exertion, but denies any increased cough, phlegm production, pleurisy,  or chest pain. She's had no abnormal headaches, change in vision, dizziness, or gait disturbance. She denies any peripheral swelling.  A detailed review of systems is otherwise stable and  noncontributory.   PAST MEDICAL HISTORY: Past Medical History  Diagnosis Date  . Hypertension   . GERD (gastroesophageal reflux disease)   . 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 Pyelonephritis 10/01/11  . Recurrent UTI (urinary tract infection) 10/01/11  . S/P chemotherapy, time since 4-12 weeks   . S/P radiation > 12 weeks 03/15/12- 04/07/12    right hip, clivus  . 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 Brain cancer     spread from breast ca  . Hypercalcemia 06/21/2012  . History of radiation therapy 08/06/10 -09/16/10    R supraclav fossa, R chest wall, R drain site  . radiaiton therapy 07/22/12 -08/15/12    T4-T7    PAST SURGICAL HISTORY: Past Surgical History  Procedure Laterality Date  . Cesarean section  1989; 2010  . Mastectomy  2011    right  . Back surgery  2012    "removed tumor,cancer, from my spine  . Port-a-cath removal  2012    right chest  . Portacath placement  2011    right  . Tubal ligation  2010  . Breast biopsy  2011    right  . Hip arthroplasty  05/04/2012    Procedure: ARTHROPLASTY BIPOLAR HIP;  Surgeon: Shelda Pal, MD;  Location: [facility];  Service: Orthopedics;  Laterality: Right;  RIGHT HIP HEMI ARTHROPLASTY     FAMILY HISTORY The patient's father is alive at age 64.  The patient's mother is alive at age 39.  She has four sisters.  She had one brother who died from colon cancer at the age of 52.  There is no breast or ovarian cancer history in the family to her knowledge.  GYNECOLOGIC HISTORY: She is GX P2.  First pregnancy to term at age 63.    SOCIAL HISTORY: The patient is not employed.  She lives by herself (her small son whose name is [name] is currently staying with his dad in [place]).  Her first child, [name], is studying business in this area.  The patient has one grandchild.  She attends the [employer].    ADVANCED DIRECTIVES: not in place  HEALTH MAINTENANCE: History  Substance Use  Topics  . Smoking status: Former Smoker -- 0.25 packs/day for 31 years    Types: Cigarettes    Quit date: 04/26/2012  . Smokeless tobacco: Never Used  . Alcohol Use: No     Colonoscopy:  PAP:  Bone density:  Lipid panel:  No Known Allergies  Current Outpatient Prescriptions  Medication Sig Dispense Refill  . cyclobenzaprine (FLEXERIL) 10 MG tablet Take 1 tablet (10 mg total) by mouth 3 (three) times daily as needed for muscle spasms.  30 tablet  1  . exemestane (AROMASIN) 25 MG tablet Take 25 mg by mouth daily after breakfast.      . ondansetron (ZOFRAN) 4 MG tablet Take 1 tablet (4 mg total) by mouth every 6 (six) hours.  20 tablet  1  . OxyCODONE HCl ER 60 MG T12A Take 1 tablet by mouth 2 (two) times daily.  14 each  0  . oxyCODONE-acetaminophen (PERCOCET) 10-325 MG per tablet Take 1-2 tablets by mouth every 6 (six) hours as needed for pain.  56 tablet  0  .  zolpidem (AMBIEN) 5 MG tablet Take 1 tablet (5 mg total) by mouth at bedtime as needed for sleep.  30 tablet  1  . dexamethasone (DECADRON) 4 MG tablet Take 2 tablets (8 mg total) by mouth every 12 (twelve) hours.  60 tablet  0  . everolimus (AFINITOR) 5 MG tablet Take 5 mg by mouth daily.      . feeding supplement (ENSURE COMPLETE) LIQD Take 237 mLs by mouth 2 (two) times daily between meals.  24 Bottle  11  . ferrous sulfate 325 (65 FE) MG tablet Take 325 mg by mouth 3 (three) times daily after meals.      . fluconazole (DIFLUCAN) 100 MG tablet Take 1 tablet (100 mg total) by mouth daily.  301 tablet  1  . pantoprazole (PROTONIX) 40 MG tablet Take 1 tablet (40 mg total) by mouth daily.  30 tablet  3  . [DISCONTINUED] Alum & Mag Hydroxide-Simeth (MAGIC MOUTHWASH W/LIDOCAINE) SOLN Take 5 mLs by mouth 3 (three) times daily as needed (take 15 minutes prior to meals.).  480 mL  0   No current facility-administered medications for this visit.    OBJECTIVE: Middle-aged [place] American woman who appears stressed and uncomfortable,  but in no acute distress   Filed Vitals:   09/27/12 1223  BP: 130/73  Pulse: 112  Temp: 98 F (36.7 C)  Resp: 20     Body mass index is 20.46 kg/(m^2).    ECOG: 2 Filed Weights   09/27/12 1223  Weight: 115 lb 8 oz (52.39 kg)   Sclerae unicteric Oropharynx clear, buccal mucosa pink but dry No cervical or supraclavicular adenopathy Lungs diminished breath sounds by basilar, greater on the right than the left, no wheezes or rhonchi Heart regular rate and rhythm Abdomen is distended with ascites, mildly tender to palpation diffusely. Positive although hypoactive bowel sounds MSK no peripheral edema Neuro: nonfocal, well oriented, anxious affect Breasts: Deferred   LAB RESULTS: Lab Results  Component Value Date   WBC 6.4 09/13/2012   NEUTROABS 4.8 09/13/2012   HGB 7.7* 09/13/2012   HCT 24.6* 09/13/2012   MCV 82.6 09/13/2012   PLT 372 09/13/2012      Chemistry      Component Value Date/Time   NA 139 09/13/2012 1348   NA 142 09/01/2012 2012   K 3.6 09/13/2012 1348   K 3.6 09/01/2012 2012   CL 102 09/13/2012 1348   CL 104 09/01/2012 2012   CO2 26 09/13/2012 1348   CO2 27 09/01/2012 2012   BUN 8.1 09/13/2012 1348   BUN 6 09/01/2012 2012   CREATININE 0.7 09/13/2012 1348   CREATININE 0.66 09/01/2012 2012      Component Value Date/Time   CALCIUM 8.9 09/13/2012 1348   CALCIUM 8.7 09/01/2012 2012   ALKPHOS 469* 09/13/2012 1348   ALKPHOS 335* 09/01/2012 2012   AST 53* 09/13/2012 1348   AST 37 09/01/2012 2012   ALT 41 09/13/2012 1348   ALT 15 09/01/2012 2012   BILITOT 0.73 09/13/2012 1348   BILITOT 0.7 09/01/2012 2012       Lab Results  Component Value Date   LABCA2 89* 06/09/2012    STUDIES:  Ct Chest W Contrast  08/04/2012  *RADIOLOGY REPORT*  Clinical Data:  Abdominal pain.  Metastatic breast cancer.  CT CHEST, ABDOMEN AND PELVIS WITH CONTRAST  Technique:  Multidetector CT imaging of the chest, abdomen and pelvis was performed following the standard protocol during bolus administration of  intravenous contrast.  Contrast: OMNIPAQUE IOHEXOL 300 MG/ML  SOLN,  Comparison:  Chest CT 04/20/2012.  CT of the abdomen and pelvis 07/07/2012.  CT CHEST  Findings:  Mediastinum: Heart size is normal. There is no significant pericardial fluid, thickening or pericardial calcification.  There is some prominent but not enlarged nodal tissue in the low right paratracheal station which appears to represent a cluster of small lymph nodes.  Several other small borderline enlarged lymph nodes are noted in the mediastinum, most notably in the subcarinal station.  No definite pathologic nodal enlargement is noted. Esophagus is unremarkable in appearance.  Lungs/Pleura: Evaluation of the lung parenchyma is limited by extensive patient respiratory motion.  With this limitation in mind there is new peribronchovascular ground-glass attenuation air space disease in the left upper lobe, and to a lesser extent in the inferior segments of the left lower lobe, concerning for multilobar pneumonia.  No definite suspicious appearing pulmonary nodules or masses are confidently identified.  Moderate bilateral pleural effusions layer dependently and are relatively simple in appearance, however, there are some areas of pleural thickening and enhancement, best demonstrated on image 43 of series 2 in the posterior aspect of the right pleural cavity, suggesting that there may be malignant pleural involvement.  Musculoskeletal: Postoperative changes of modified radical right sided mastectomy with right axillary clips compatible with prior nodal dissection.  Innumerable mixed lytic and sclerotic lesions are again noted throughout the visualized axial and appendicular skeleton, most pronounced throughout the thoracic spine.  There is a new nondisplaced pathologic fracture through the posterior aspect of the right eleventh rib.  Postoperative changes of laminectomy are noted at its T9-T10. Enhancing soft tissue in the epidural space in the  left side of the upper thoracic spine in the right side of the lower thoracic spine, concerning for additional sites of metastatic disease.  No definite pathologic vertebral body compression fractures are identified at this time.  IMPRESSION:  1. Enlarging moderate bilateral pleural effusions, with some pleural thickening and enhancement, suggesting malignant pleural metastases.  Sampling of pleural fluid may be warranted if clinically indicated. 2. Progression of metastatic disease to the bones, particularly throughout the thoracic spine, with a new nondisplaced pathologic fracture of the posterior aspect of the right eleventh rib. Enhancing soft tissue in the epidural space adjacent to both the upper and lower thoracic spine is also noted, better demonstrated on recent MRI. 3.  New multifocal peribronchovascular air space disease throughout the left upper and lower lobe concerning for multilobar bronchopneumonia.  CT ABDOMEN AND PELVIS  Findings:  Abdomen/Pelvis: There are again innumerable hepatic nodules and masses, compatible with widespread metastatic disease, largest of which measures up to 10.8 x 9.8 cm in the right lobe of the liver (image 48 of series 2).  Overall, the size of these lesions appears very similar to the prior examination, if not slightly increased, but there is increasing central low attenuation within many of these lesions, suggesting internal areas of necrosis and vascular compromise.  Some lesions do appear slightly larger, including a small lesion in the right lobe of the liver on image 66 of series 2 which currently measures 13 mm (previously only 9 mm).  The appearance of the gallbladder, pancreas, spleen, bilateral adrenal glands in the right kidney is unremarkable.  Parenchymal thinning in the interpolar region of the left kidney, and a tiny 2 mm nonobstructive calculus in the interpolar left kidney are both unchanged.  A tiny volume of ascites in the anatomic pelvis is simple in  appearance.  Uterus is heterogeneous in appearance with multifocal low attenuation and partially calcified lesions, likely represent fibroids.  Ovaries are unremarkable in appearance. Assessment of the anatomic pelvis is significantly limited by extensive beam hardening artifact from the patient's right sided hip prosthesis.  The urinary bladder is moderately distended without focal abnormalities.  No pneumoperitoneum.  No pathologic distension of small bowel.  No definite pathologic lymphadenopathy identified within the abdomen or pelvis.  Relatively large volume of well formed stool throughout the colon may suggest constipation.  Musculoskeletal: Status post right total hip arthroplasty. Numerous mixed lytic and sclerotic lesions are again noted throughout the visualized axial and appendicular skeleton, similar in appearance to prior examinations.  IMPRESSION:  1.  Overall, the burden of metastatic disease in the bones and the liver is very similar to the prior examination, with suggestion of slight progression in the liver, as above.  Importantly, many of the liver lesions do show areas of internal decreased enhancement, which could suggest developing areas of necrosis. 2.  New small volume of ascites. 3.  Moderate volume of well formed stool throughout the colon may suggest constipation. 4.  Additional incidental findings, as above.   Original Report Authenticated By: Trudie Reed, M.D.     ASSESSMENT:   47 y.o.  [place] woman    1. 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    2.Status post adjuvant chemotherapy July to November 2011, consisting of 4 cycles of dose dense doxorubicin and cyclophosphamide, followed by 4 of 12 planned weekly doses of paclitaxel given adjuvantly, discontinued secondary to peripheral  neuropathy.    3. Status post radiation to the right chest wall, truncated due to problems with the patient missing appointments.   4. Tamoxifen started June of 2012, but discontinued due to nausea.    5. metastatic disease to liver and bone pathologically documented October of 2012, estrogen receptor 100% positive, progesterone receptor 7% positive, with no HER-2 amplification;  treatment in the metastatic setting has consisted of:   6.Faslodex started 03/16/2011, discontinued November 2013 with progression. Zoladex every 3 months also started on 03/16/2011, and monthly zoledronic acid started November 2011, the Zometa I currently held due to pending dental procedures and possible extractions.             7. Status post thoracic laminectomy, T9-T11, for impending cord compression on 03/21/2011, followed by irradiation to the spine completed November 2011  8. 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 s/p palliative radiation to the Right hip clivus to 30 Gy completed 04/07/2012  10.  Progression noted by scans in November 2013, at which time Faslodex was discontinued. Patient was started on exemestane in early December, with everolimus started in mid December at 5 mg daily. Continues to receive Zoladex injections every 3 months, last given 09/05/2012.  11. Nondisplaced right femoral neck fracture, status post right hip arthroplasty on 05/04/2012 under [facility].  12. Hypercalcemia with elevated serum creatinine: resolved  13.  s/p palliative radiation therapy for cord compression completed 08/15/2012  14. Poorly controlled pain.       PLAN:  Matisse continues to have chronic pain associated with her metastatic breast cancer. Unfortunately, the pain has increased in her left leg, and  we'll obtain a plain film x-ray of the left hip and femur today to  evaluate for any impending fractures. We are awaiting her urine drug screening, and if the drug screen is clear, will receive enough for one week (which currently means OxyContin 60 mg 14 tablets and oxycodone with APAP, 10/325 one to 2 tablets up to 4 times a day as needed,  a total of 56 tablets maximum per week). She has a prescription for OxyContin on file at the pharmacy, and according to the pharmacist, that can be picked up tomorrow as well. She also needs a refill on her ondansetron for occasional nausea.  I am also going to obtain a KUB to evaluate her constipation with abdominal pain and assess for possible obstruction. If that is negative, we will need to be very aggressive with Kazaria's bowels. We discussed the possibility of a paracentesis which she declines at the current time.  She'll continue on exemestane, and plans to pick up her everolimus tomorrow from the pharmacy, to resume 5 mg daily. She'll continue to receive her goserelin injections on a every 3 month basis, last given 09/05/2012, and due again in early July.   We will continue to check labs every Tuesday, which will include a urine drug screen. If that is negative, she will receive her weekly prescriptions on Wednesdays, and these can be refill that her pharmacy every Wednesday. I plan on seeing her briefly again next week. I plan is to restage in approximately 3 months, approximately and is early July. Jacquelin voices understanding and agreement with this plan, and will call with any changes or problems.   Sargent Mankey    09/27/2012

## 2012-09-28 ENCOUNTER — Other Ambulatory Visit: Payer: Self-pay | Admitting: Physician Assistant

## 2012-09-28 DIAGNOSIS — C7951 Secondary malignant neoplasm of bone: Secondary | ICD-10-CM

## 2012-09-28 DIAGNOSIS — C50919 Malignant neoplasm of unspecified site of unspecified female breast: Secondary | ICD-10-CM

## 2012-09-28 LAB — URINE DRUGS OF ABUSE SCREEN W ALC, ROUTINE (REF LAB)
Benzodiazepines.: NEGATIVE
Creatinine,U: 246.9 mg/dL
Marijuana Metabolite: NEGATIVE
Methadone: NEGATIVE
Opiate Screen, Urine: NEGATIVE
Phencyclidine (PCP): NEGATIVE
Propoxyphene: NEGATIVE

## 2012-09-28 MED ORDER — OXYCODONE-ACETAMINOPHEN 10-325 MG PO TABS: 1.0000 | ORAL_TABLET | Freq: Four times a day (QID) | ORAL | Status: DC | PRN

## 2012-09-28 MED ORDER — ONDANSETRON HCL 4 MG PO TABS: 4.0000 mg | ORAL_TABLET | Freq: Four times a day (QID) | ORAL | Status: DC

## 2012-09-28 NOTE — Progress Notes (Signed)
[name] returned to the office today to pick up her prescription for Percocet as previously agreed.  Unfortunately, the drug screen obtained yesterday is still pending.  Our desk nurse spoke with the [facility] lab and was told it "may take 3-5 days" to get results back.  [name] tells [place] she is "clean".    This situation was reviewed with Dr. Darnelle Catalan.  He is in agreement with giving her the prescription today as previously discussed.  Oxycodone/APAP, 10/325 mg, 1-2 tablets every 6 hours as needed for pain, dispense #56 with no refills.  Zollie Scale, PA-C 09/28/2012

## 2012-09-29 ENCOUNTER — Other Ambulatory Visit: Payer: Self-pay | Admitting: *Deleted

## 2012-09-29 ENCOUNTER — Telehealth: Payer: Self-pay | Admitting: *Deleted

## 2012-09-29 NOTE — Telephone Encounter (Signed)
Lm gv appt d/t for lab on 10/03/12 @2pm ..ask pt to come by the scheduler dept so that i can print her a scheduler. However, she is aware of all her scheduled appts...td

## 2012-09-29 NOTE — Progress Notes (Signed)
This RN called pt per number and spoke with her daughter. Discussed need to change lab days to accommodate results for review. Per call dtr verbalized understanding and ability to arrange transportation for 2pm pm 5/5 for lab only.

## 2012-10-03 ENCOUNTER — Ambulatory Visit (HOSPITAL_BASED_OUTPATIENT_CLINIC_OR_DEPARTMENT_OTHER): Payer: Medicaid Other | Admitting: Lab

## 2012-10-03 ENCOUNTER — Other Ambulatory Visit: Payer: Medicaid Other

## 2012-10-04 ENCOUNTER — Encounter: Payer: Self-pay | Admitting: Radiation Oncology

## 2012-10-04 ENCOUNTER — Other Ambulatory Visit: Payer: Self-pay | Admitting: *Deleted

## 2012-10-04 ENCOUNTER — Ambulatory Visit: Payer: Medicaid Other | Admitting: Physician Assistant

## 2012-10-04 ENCOUNTER — Ambulatory Visit: Payer: Medicaid Other | Admitting: Lab

## 2012-10-04 ENCOUNTER — Other Ambulatory Visit: Payer: Self-pay | Admitting: Physician Assistant

## 2012-10-04 ENCOUNTER — Other Ambulatory Visit: Payer: Medicaid Other | Admitting: Lab

## 2012-10-04 ENCOUNTER — Ambulatory Visit
Admission: RE | Admit: 2012-10-04 | Discharge: 2012-10-04 | Disposition: A | Discharge status: Home / Self Care | Payer: Medicaid Other | Source: Ambulatory Visit | Attending: Radiation Oncology | Admitting: Radiation Oncology

## 2012-10-04 VITALS — BP 145/89 | HR 104 | Temp 97.9°F | Wt 113.0 lb

## 2012-10-04 DIAGNOSIS — C7952 Secondary malignant neoplasm of bone marrow: Secondary | ICD-10-CM

## 2012-10-04 DIAGNOSIS — C7951 Secondary malignant neoplasm of bone: Secondary | ICD-10-CM

## 2012-10-04 DIAGNOSIS — C50519 Malignant neoplasm of lower-outer quadrant of unspecified female breast: Secondary | ICD-10-CM

## 2012-10-04 DIAGNOSIS — C787 Secondary malignant neoplasm of liver and intrahepatic bile duct: Secondary | ICD-10-CM

## 2012-10-04 LAB — CBC WITH DIFFERENTIAL/PLATELET
BASO%: 0.2 % (ref 0.0–2.0)
EOS%: 0.7 % (ref 0.0–7.0)
MCH: 28.1 pg (ref 25.1–34.0)
MCHC: 33.8 g/dL (ref 31.5–36.0)
RBC: 3.1 10*6/uL — ABNORMAL LOW (ref 3.70–5.45)
RDW: 22.7 % — ABNORMAL HIGH (ref 11.2–14.5)
lymph#: 0.6 10*3/uL — ABNORMAL LOW (ref 0.9–3.3)

## 2012-10-04 LAB — COMPREHENSIVE METABOLIC PANEL (CC13)
ALT: 15 U/L (ref 0–55)
AST: 52 U/L — ABNORMAL HIGH (ref 5–34)
Albumin: 2.7 g/dL — ABNORMAL LOW (ref 3.5–5.0)
Alkaline Phosphatase: 642 U/L — ABNORMAL HIGH (ref 40–150)
Calcium: 8.9 mg/dL (ref 8.4–10.4)
Chloride: 104 mEq/L (ref 98–107)
Potassium: 3.5 mEq/L (ref 3.5–5.1)

## 2012-10-04 MED ORDER — OXYCODONE-ACETAMINOPHEN 5-325 MG PO TABS
2.0000 | ORAL_TABLET | Freq: Once | ORAL | Status: AC
  Administered 2012-10-04: 2 via ORAL

## 2012-10-04 MED ORDER — LORAZEPAM 1 MG PO TABS
1.0000 mg | ORAL_TABLET | Freq: Once | ORAL | Status: AC
  Administered 2012-10-04: 1 mg via ORAL

## 2012-10-04 NOTE — Progress Notes (Signed)
Department of Radiation Oncology  Phone:  [phone] Fax:        [phone]   Name: [name] MRN: [id number]  DOB: [birth year]  Date: 10/04/2012  Follow Up Visit Note  Diagnosis: Metastatic breast cancer  Summary and Interval since last radiation: 27 Gy to T4-T7 completed 08/15/12  Interval History: [name] presents today for routine followup.  She is about 6 weeks out from treatment to the cord compression in her thoracic spine.  She has been treated to multiple areas.  She is now complaining of left hip pain in her left groin to her knee.  She had abdominal films on 4/29 and a left femur films on 4/29.  The abdominal films showed an enlarged liver and diffuse metastatic bony disease and the left femur film showed no acute fraction and no metastatic disease. She did have some metastatic disease in her left ilium on a CT scan awhile ago. But nothing to explain this pain.  She is somewhat out of it right now having received percocet and ativan recently upstairs.  She complains of pain in her left leg. Worse when standing.  I am unclear as to the plan for [name] as she has stated she is not ready for hospice but really is not a candidate for may other systemic options due to her non compliance.  Allergies: No Known Allergies  Medications:  Current Outpatient Prescriptions  Medication Sig Dispense Refill  . cyclobenzaprine (FLEXERIL) 10 MG tablet Take 1 tablet (10 mg total) by mouth 3 (three) times daily as needed for muscle spasms.  30 tablet  1  . dexamethasone (DECADRON) 4 MG tablet Take 2 tablets (8 mg total) by mouth every 12 (twelve) hours.  60 tablet  0  . everolimus (AFINITOR) 5 MG tablet Take 5 mg by mouth daily.      [name] exemestane (AROMASIN) 25 MG tablet Take 25 mg by mouth daily after breakfast.      . feeding supplement (ENSURE COMPLETE) LIQD Take 237 mLs by mouth 2 (two) times daily between meals.  24 Bottle  11  . ferrous sulfate 325 (65 FE) MG tablet Take 325 mg  by mouth 3 (three) times daily after meals.      . fluconazole (DIFLUCAN) 100 MG tablet Take 1 tablet (100 mg total) by mouth daily.  301 tablet  1  . ondansetron (ZOFRAN) 4 MG tablet Take 1 tablet (4 mg total) by mouth every 6 (six) hours.  30 tablet  1  . OxyCODONE HCl ER 60 MG T12A Take 1 tablet by mouth 2 (two) times daily.  14 each  0  . oxyCODONE-acetaminophen (PERCOCET) 10-325 MG per tablet Take 1-2 tablets by mouth every 6 (six) hours as needed for pain.  56 tablet  0  . pantoprazole (PROTONIX) 40 MG tablet Take 1 tablet (40 mg total) by mouth daily.  30 tablet  3  . zolpidem (AMBIEN) 5 MG tablet Take 1 tablet (5 mg total) by mouth at bedtime as needed for sleep.  30 tablet  1  . [DISCONTINUED] Alum & Mag Hydroxide-Simeth (MAGIC MOUTHWASH W/LIDOCAINE) SOLN Take 5 mLs by mouth 3 (three) times daily as needed (take 15 minutes prior to meals.).  480 mL  0   No current facility-administered medications for this encounter.    Physical Exam:  Filed Vitals:   10/04/12 1334  BP: 145/89  Pulse: 104  Temp: 97.9 F (36.6 C)  Somnolent, sitting in a wheelchair.  IMPRESSION: [name]  is a 47 y.o. female with metastatic breast cancer, poor compliance and unexplained left groin/hip pain  PLAN:  We could try an MRI or CT of this area but transportation is difficult. Right now I do not have a radiation target for this left hip. I would recommend observation and pain management for now.  If her pain worsens, we could consider RT.  Of note, she is on a pain contract with Dr. Darnelle Catalan. I will see her back as needed.     [name], MD

## 2012-10-04 NOTE — Progress Notes (Signed)
Pt presented to lobby for appointments with radiation oncology in tears crying " help me - Jesus " and rocking back and forth. Friend at pt's side. Per inquiry Lance states she is out of medication " and can't go home like this ".  Per MD review percocet and ativan administered at 1215 approximately.  Prescription given to 2 days of pain meds for coverage until labs are returned per contract.  Shirley in rad/onc notified of above for understanding if pt is sleepy.

## 2012-10-04 NOTE — Progress Notes (Signed)
[name] seen by Dr. Darnelle Catalan today and received Ativan 1 mg po at 1233, and received 2 Percocet 5/325 mg tabs at 1234.  It is reporting that she was moaning in pain and stating "there is no way I can go home this way".   She is up to a wheelchair presently and is very drowsy with slow responses , but easy to arouse.  She currently reports that her pain is now at a level 7 on a scale of 0-10 since receiving medication  From Medical Oncology.  She states she is hurting all over, especially from the left groin to her left  Knee.    Accompanied by her in home aid.

## 2012-10-06 ENCOUNTER — Other Ambulatory Visit: Payer: Self-pay | Admitting: *Deleted

## 2012-10-06 DIAGNOSIS — C787 Secondary malignant neoplasm of liver and intrahepatic bile duct: Secondary | ICD-10-CM

## 2012-10-06 DIAGNOSIS — C7951 Secondary malignant neoplasm of bone: Secondary | ICD-10-CM

## 2012-10-06 DIAGNOSIS — C50919 Malignant neoplasm of unspecified site of unspecified female breast: Secondary | ICD-10-CM

## 2012-10-06 MED ORDER — OXYCODONE HCL ER 60 MG PO T12A: 1.0000 | EXTENDED_RELEASE_TABLET | Freq: Two times a day (BID) | ORAL | Status: DC

## 2012-10-06 MED ORDER — OXYCODONE-ACETAMINOPHEN 10-325 MG PO TABS: 1.0000 | ORAL_TABLET | Freq: Four times a day (QID) | ORAL | Status: DC | PRN

## 2012-10-08 LAB — URINE DRUGS OF ABUSE SCREEN W ALC, ROUTINE (REF LAB)
Amphetamine Screen, Ur: NEGATIVE
Barbiturate Quant, Ur: NEGATIVE
Methadone: NEGATIVE
Opiate Screen, Urine: NEGATIVE

## 2012-10-08 LAB — OXYCODONE SCREEN, UA, RFLX CONFIRM

## 2012-10-10 ENCOUNTER — Other Ambulatory Visit: Payer: Medicaid Other | Admitting: Lab

## 2012-10-10 ENCOUNTER — Other Ambulatory Visit: Payer: Medicaid Other

## 2012-10-11 ENCOUNTER — Other Ambulatory Visit: Payer: Medicaid Other

## 2012-10-12 ENCOUNTER — Ambulatory Visit (HOSPITAL_BASED_OUTPATIENT_CLINIC_OR_DEPARTMENT_OTHER): Payer: Medicaid Other | Admitting: Physician Assistant

## 2012-10-12 ENCOUNTER — Encounter: Payer: Self-pay | Admitting: Physician Assistant

## 2012-10-12 ENCOUNTER — Telehealth: Payer: Self-pay | Admitting: Oncology

## 2012-10-12 ENCOUNTER — Ambulatory Visit (HOSPITAL_BASED_OUTPATIENT_CLINIC_OR_DEPARTMENT_OTHER): Payer: Medicaid Other

## 2012-10-12 VITALS — BP 133/78 | HR 99 | Temp 98.3°F | Resp 20 | Ht 63.0 in | Wt 117.5 lb

## 2012-10-12 DIAGNOSIS — D649 Anemia, unspecified: Secondary | ICD-10-CM

## 2012-10-12 DIAGNOSIS — C787 Secondary malignant neoplasm of liver and intrahepatic bile duct: Secondary | ICD-10-CM

## 2012-10-12 DIAGNOSIS — C50519 Malignant neoplasm of lower-outer quadrant of unspecified female breast: Secondary | ICD-10-CM

## 2012-10-12 DIAGNOSIS — C7952 Secondary malignant neoplasm of bone marrow: Secondary | ICD-10-CM

## 2012-10-12 DIAGNOSIS — C719 Malignant neoplasm of brain, unspecified: Secondary | ICD-10-CM

## 2012-10-12 DIAGNOSIS — G893 Neoplasm related pain (acute) (chronic): Secondary | ICD-10-CM

## 2012-10-12 DIAGNOSIS — C50919 Malignant neoplasm of unspecified site of unspecified female breast: Secondary | ICD-10-CM

## 2012-10-12 DIAGNOSIS — R188 Other ascites: Secondary | ICD-10-CM | POA: Insufficient documentation

## 2012-10-12 DIAGNOSIS — C7951 Secondary malignant neoplasm of bone: Secondary | ICD-10-CM

## 2012-10-12 DIAGNOSIS — R109 Unspecified abdominal pain: Secondary | ICD-10-CM

## 2012-10-12 LAB — CBC WITH DIFFERENTIAL/PLATELET
BASO%: 0.6 % (ref 0.0–2.0)
Basophils Absolute: 0 10*3/uL (ref 0.0–0.1)
EOS%: 1.1 % (ref 0.0–7.0)
HCT: 26.5 % — ABNORMAL LOW (ref 34.8–46.6)
LYMPH%: 15 % (ref 14.0–49.7)
MCH: 27.4 pg (ref 25.1–34.0)
MCHC: 32.4 g/dL (ref 31.5–36.0)
MCV: 84.6 fL (ref 79.5–101.0)
MONO%: 5.2 % (ref 0.0–14.0)
NEUT%: 78.1 % — ABNORMAL HIGH (ref 38.4–76.8)
lymph#: 0.8 10*3/uL — ABNORMAL LOW (ref 0.9–3.3)

## 2012-10-12 NOTE — Progress Notes (Signed)
ID: [name]   DOB: [birth year]  MR#: [id number]  NWG#:[id number]  PCP: Pcp Not In System   HISTORY OF PRESENT ILLNESS: The patient delivered a child on 05/17/2009, after which she felt fullness in the right breast. She eventually brought this to her doctors attention and was scheduled for mammography and ultrasound on 03/08/2009. There were no prior exams for comparison. Mammography showed the breast to be dense, with a 5 cm obscured mass in the outer midportion of the right breast. Mass was easily palpable, and there was also a mass in the outer right periareolar region. They were suspicious microcalcifications in the outer midportion of the right breast, as well as within the mass itself. Ultrasound showed an extensive heterogeneous area, corresponding to the palpable abnormality with multiple solid and cystic foci. Overall, the mass measured in excess of 5 cm, in addition to cysts in the breast.  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 The tumors were ER +100%, PR +80%.  Bilateral breast MRI was obtained 03/22/2009. There was a large area of abnormal enhancement in the lower outer quadrant of the right breast measuring up to 9.7 cm. No abnormal appearing lymph nodes and no other masses in either breast.  Patient underwent definitive right mastectomy and sentinel lymph node sampling on 04/29/2009 under the care of Dr. Derrell Lolling. 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 Repeat prognostic panel showed tumor to be ER +93% and PR +51%. HER-2/neu was negative with a ratio of 0.71, with proliferation marker of 36%.  Patient was evaluated by Dr. Darnelle Catalan in January 2011 but failed to keep appointments until she was seen here again in July 2011. In July, she began adjuvant chemotherapy. She received 4  cycles of dose dense doxorubicin and cyclophosphamide followed by 4 weekly doses of paclitaxel which was discontinued in November 2011 secondary to peripheral neuropathy. The patient then received radiation therapy to the right chest wall which was truncated due to problems with missing appointments.  Patient was started on tamoxifen briefly in June of 2012 which was discontinued due to nausea. The nausea continued, and an abdominal CT in October 2012 confirmed a growth in liver lesions. These were biopsied on 03/10/2011 and showed metastatic adenocarcinoma, still ER positive at 100%, PR +5%. Her subsequent treatments are as detailed below  INTERVAL HISTORY: [name] returns today for followup of her metastatic breast carcinoma. Per her visit with Dr. Darnelle Catalan on 09/05/2012, we are checking [name]'s urine for drug abuse on a weekly basis, and are refilling her pain prescriptions weekly, once those results are obtained.   [name] continues to have quite a bit of pain, but feels this is well-controlled as long as she is taking the OxyContin and the oxycodone regularly. She continues to ambulate with a cane. She's having pain bilaterally in the hips, but has had increased pain in the left leg over the past few weeks. Fortunately, the recent x-ray of the left hip was unremarkable.  [name]'s  biggest complaint today is her abdominal "bloating". Her abdomen is tight and painful. She's having increased cramping. She continues to have problems with constipation. She has a decreased appetite. She has occasional nausea, but no actual emesis.  [name] continues on the exemestane, but has not yet picked up her Affinitor, which was prescribed several weeks ago.  REVIEW OF SYSTEMS: [name] denies any  fevers or chills. She notes no skin changes, but does have some generalized itching. She has some shortness of breath with exertion, but denies any increased cough, phlegm production, pleurisy, or chest pain. She's had  no abnormal headaches, change in vision, dizziness, or gait disturbance. She denies any peripheral swelling.  A detailed review of systems is otherwise stable and noncontributory.   PAST MEDICAL HISTORY: Past Medical History  Diagnosis Date  . Hypertension   . GERD (gastroesophageal reflux disease)   . 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 Pyelonephritis 10/01/11  . Recurrent UTI (urinary tract infection) 10/01/11  . S/P chemotherapy, time since 4-12 weeks   . S/P radiation > 12 weeks 03/15/12- 04/07/12    right hip, clivus  . 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 Brain cancer     spread from breast ca  . Hypercalcemia 06/21/2012  . History of radiation therapy 08/06/10 -09/16/10    R supraclav fossa, R chest wall, R drain site  . radiaiton therapy 07/22/12 -08/15/12    T4-T7    PAST SURGICAL HISTORY: Past Surgical History  Procedure Laterality Date  . Cesarean section  1989; 2010  . Mastectomy  2011    right  . Back surgery  2012    "removed tumor,cancer, from my spine  . Port-a-cath removal  2012    right chest  . Portacath placement  2011    right  . Tubal ligation  2010  . Breast biopsy  2011    right  . Hip arthroplasty  05/04/2012    Procedure: ARTHROPLASTY BIPOLAR HIP;  Surgeon: Shelda Pal, MD;  Location: [facility];  Service: Orthopedics;  Laterality: Right;  RIGHT HIP HEMI ARTHROPLASTY     FAMILY HISTORY The patient's father is alive at age 42.  The patient's mother is alive at age 13.  She has four sisters.  She had one brother who died from colon cancer at the age of 65.  There is no breast or ovarian cancer history in the family to her knowledge.  GYNECOLOGIC HISTORY: She is GX P2.  First pregnancy to term at age 77.    SOCIAL HISTORY: The patient is not employed.  She lives by herself (her small son whose name is [name] is currently staying with his dad in [place]).  Her first child, [name], is studying business in  this area.  The patient has one grandchild.  She attends the [employer].    ADVANCED DIRECTIVES: not in place  HEALTH MAINTENANCE: History  Substance Use Topics  . Smoking status: Former Smoker -- 0.25 packs/day for 31 years    Types: Cigarettes    Quit date: 04/26/2012  . Smokeless tobacco: Never Used  . Alcohol Use: No     Colonoscopy:  PAP:  Bone density:  Lipid panel:  No Known Allergies  Current Outpatient Prescriptions  Medication Sig Dispense Refill  . cyclobenzaprine (FLEXERIL) 10 MG tablet Take 1 tablet (10 mg total) by mouth 3 (three) times daily as needed for muscle spasms.  30 tablet  1  . dexamethasone (DECADRON) 4 MG tablet Take 2 tablets (8 mg total) by mouth every 12 (twelve) hours.  60 tablet  0  . everolimus (AFINITOR) 5 MG tablet Take 5 mg by mouth daily.      [name] exemestane (AROMASIN) 25 MG tablet Take 25 mg by mouth daily after breakfast.      .  feeding supplement (ENSURE COMPLETE) LIQD Take 237 mLs by mouth 2 (two) times daily between meals.  24 Bottle  11  . ferrous sulfate 325 (65 FE) MG tablet Take 325 mg by mouth 3 (three) times daily after meals.      . fluconazole (DIFLUCAN) 100 MG tablet Take 1 tablet (100 mg total) by mouth daily.  301 tablet  1  . ondansetron (ZOFRAN) 4 MG tablet Take 1 tablet (4 mg total) by mouth every 6 (six) hours.  30 tablet  1  . OxyCODONE HCl ER 60 MG T12A Take 1 tablet by mouth 2 (two) times daily.  14 each  0  . oxyCODONE-acetaminophen (PERCOCET) 10-325 MG per tablet Take 1-2 tablets by mouth every 6 (six) hours as needed for pain.  56 tablet  0  . pantoprazole (PROTONIX) 40 MG tablet Take 1 tablet (40 mg total) by mouth daily.  30 tablet  3  . zolpidem (AMBIEN) 5 MG tablet Take 1 tablet (5 mg total) by mouth at bedtime as needed for sleep.  30 tablet  1  . [DISCONTINUED] Alum & Mag Hydroxide-Simeth (MAGIC MOUTHWASH W/LIDOCAINE) SOLN Take 5 mLs by mouth 3 (three) times daily as needed (take 15 minutes prior to meals.).   480 mL  0   No current facility-administered medications for this visit.    OBJECTIVE: Middle-aged [place] American woman who appears tired and uncomfortable, but in no acute distress   Filed Vitals:   10/12/12 1212  BP: 133/78  Pulse: 99  Temp: 98.3 F (36.8 C)  Resp: 20     Body mass index is 20.82 kg/(m^2).    ECOG: 2 Filed Weights   10/12/12 1212  Weight: 117 lb 8 oz (53.298 kg)   Sclerae unicteric Oropharynx clear No cervical or supraclavicular adenopathy Lungs diminished breath sounds bibasilar, greater on the right than the left, no wheezes or rhonchi Heart regular rate and rhythm Abdomen is distended with ascites, mildly tender to palpation diffusely. Positive although hypoactive bowel sounds 1+ pending edema bilaterally in the lower extremities, equal bilaterally. No upper extremity edema is noted. Neuro: nonfocal, well oriented, anxious affect Breasts: Deferred   LAB RESULTS: Lab Results  Component Value Date   WBC 5.2 10/12/2012   NEUTROABS 4.0 10/12/2012   HGB 8.6* 10/12/2012   HCT 26.5* 10/12/2012   MCV 84.6 10/12/2012   PLT 341 10/12/2012      Chemistry      Component Value Date/Time   NA 138 10/04/2012 1318   NA 142 09/01/2012 2012   K 3.5 10/04/2012 1318   K 3.6 09/01/2012 2012   CL 104 10/04/2012 1318   CL 104 09/01/2012 2012   CO2 23 10/04/2012 1318   CO2 27 09/01/2012 2012   BUN 8.7 10/04/2012 1318   BUN 6 09/01/2012 2012   CREATININE 0.7 10/04/2012 1318   CREATININE 0.66 09/01/2012 2012      Component Value Date/Time   CALCIUM 8.9 10/04/2012 1318   CALCIUM 8.7 09/01/2012 2012   ALKPHOS 642* 10/04/2012 1318   ALKPHOS 335* 09/01/2012 2012   AST 52* 10/04/2012 1318   AST 37 09/01/2012 2012   ALT 15 10/04/2012 1318   ALT 15 09/01/2012 2012   BILITOT 1.07 10/04/2012 1318   BILITOT 0.7 09/01/2012 2012       Lab Results  Component Value Date   LABCA2 89* 06/09/2012    STUDIES:  Dg Hip Complete Left  09/27/2012   *RADIOLOGY REPORT*  Clinical Data: History of  painful left hip.   History of painful fever.  History of metastatic breast carcinoma.  LEFT HIP - COMPLETE 2+ VIEW  Comparison: CT 08/06/2012.  Findings: On the CT there is some subtle blastic disease seen in the medial aspect of the left iliac bone.  There is also slight increased density and sclerosis on the plain imaging area.  No pathological fracture is evident.  No definite lytic or blastic process is seen in the left proximal femur or left hip.  Previous right hip arthroplasty has been performed without evidence of disruption of hardware.  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  IMPRESSION: Blastic metastatic disease involving the medial aspect of the left iliac bone.  No definite blastic or lytic lesion is seen within the proximal femur or hip.  No pathological fractures evident. Post right hip arthroplasty.  Calcified uterine leiomyomas.   Original Report Authenticated By: Onalee Hua Call   Dg Femur Left  09/27/2012   *RADIOLOGY REPORT*  Clinical Data: History of pain in the left femur.  No known injury. History of metastatic breast carcinoma.  LEFT FEMUR - 2 VIEW  Comparison: 08/04/2012.  Findings: No definite lytic or blastic lesion is seen within the femur.  No fracture dislocation is evident.  No soft tissue lesion is seen.  IMPRESSION: No femoral lesion is evident.   Original Report Authenticated By: Onalee Hua Call   Dg Abd 1 View  09/27/2012   *RADIOLOGY REPORT*  Clinical Data: Metastatic breast cancer.  Abdominal discomfort and distension.  ABDOMEN - 1 VIEW  Comparison: 09/01/2012  Findings: Diffuse osseous metastatic disease.  Enlarged liver displacing bowel.  Nonspecific bowel gas pattern with gas filled bowel left lower quadrant of the abdomen/pelvis possibly representing colon rather than distended small bowel loops.  IMPRESSION: Enlarged liver with displacement of bowel.  No definitive findings of bowel obstruction.  Diffuse osseous metastatic disease.   Original Report Authenticated By: Lacy Duverney,  M.D.     ASSESSMENT:   47 y.o.  [place] woman    1. 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    2.Status post adjuvant chemotherapy July to November 2011, consisting of 4 cycles of dose dense doxorubicin and cyclophosphamide, followed by 4 of 12 planned weekly doses of paclitaxel given adjuvantly, discontinued secondary to peripheral neuropathy.    3. Status post radiation to the right chest wall, truncated due to problems with the patient missing appointments.   4. Tamoxifen started June of 2012, but discontinued due to nausea.    5. metastatic disease to liver and bone pathologically documented October of 2012, estrogen receptor 100% positive, progesterone receptor 7% positive, with no HER-2 amplification;  treatment in the metastatic setting has consisted of:   6.Faslodex started 03/16/2011, discontinued November 2013 with progression. Zoladex every 3 months also started on 03/16/2011, and monthly zoledronic acid started November 2011, the Zometa I currently held due to pending dental procedures and possible extractions.             7. Status post thoracic laminectomy, T9-T11, for impending cord compression on 03/21/2011, followed by irradiation to the spine completed November 2011  8. 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 s/p palliative radiation to the Right hip clivus to 30 Gy completed 04/07/2012  10.  Progression noted by scans in November 2013, at which time Faslodex was discontinued. Patient was started on exemestane in early December, with everolimus started in mid December at 5 mg daily.  Continues to receive Zoladex injections every 3 months, last given  09/05/2012.  11. Nondisplaced right femoral neck fracture, status post right hip arthroplasty on 05/04/2012 under the care of Dr.Matt [facility].  12. Hypercalcemia with elevated serum creatinine: resolved  13.  s/p palliative radiation therapy for cord compression completed 08/15/2012  14. Poorly controlled pain.      PLAN:  Miosotis continues to have chronic pain associated with her metastatic breast cancer, especially in the lower back, both hips, and both legs. She'll continue on her current pain regimen  (which currently means OxyContin 60 mg 14 tablets and oxycodone with APAP, 10/325 one to 2 tablets up to 4 times a day as needed,  a total of 56 tablets maximum per week). We do have a pain contract with [facility], as previously agreed, we will obtain weekly drug screens, and prescribe her medications on weekly basis accordingly.  We are going to schedule a therapeutic paracentesis with cytology, hopefully for tomorrow morning, for her increased ascites which has become more of a problem.  Otherwise, [name] will continue to come in every Monday for her labs. We will see her for physical exam every 2 weeks, and her next appointment with me will be on May 29th.  I have encouraged her to pick up her Affinitor as soon as possible to restart that medication.  From a clinical standpoint, it is obvious that her disease is worsening. She does not restart the Affintor, we are really out of options, and we need to seriously consider hospice care. We'll address this with [name] again when she returns in 2 weeks  [name] voices understanding and agreement with this plan, and will call with any changes or problems.   [name]    10/12/2012

## 2012-10-13 ENCOUNTER — Other Ambulatory Visit: Payer: Self-pay | Admitting: Physician Assistant

## 2012-10-13 ENCOUNTER — Ambulatory Visit (HOSPITAL_COMMUNITY): Admission: RE | Admit: 2012-10-13 | Payer: Medicaid Other | Source: Ambulatory Visit

## 2012-10-13 ENCOUNTER — Other Ambulatory Visit: Payer: Self-pay | Admitting: *Deleted

## 2012-10-13 DIAGNOSIS — C7951 Secondary malignant neoplasm of bone: Secondary | ICD-10-CM

## 2012-10-13 DIAGNOSIS — C50919 Malignant neoplasm of unspecified site of unspecified female breast: Secondary | ICD-10-CM

## 2012-10-13 LAB — OXYCODONE SCREEN, UA, RFLX CONFIRM: Oxycodone Screen, Ur: NEGATIVE ng/mL

## 2012-10-13 LAB — URINE DRUGS OF ABUSE SCREEN W ALC, ROUTINE (REF LAB)
Amphetamine Screen, Ur: NEGATIVE
Benzodiazepines.: NEGATIVE
Cocaine Metabolites: NEGATIVE
Creatinine,U: 250.6 mg/dL
Phencyclidine (PCP): NEGATIVE

## 2012-10-13 MED ORDER — OXYCODONE HCL ER 60 MG PO T12A: 1.0000 | EXTENDED_RELEASE_TABLET | Freq: Two times a day (BID) | ORAL | Status: DC

## 2012-10-13 MED ORDER — OXYCODONE-ACETAMINOPHEN 10-325 MG PO TABS: 1.0000 | ORAL_TABLET | Freq: Four times a day (QID) | ORAL | Status: DC | PRN

## 2012-10-16 ENCOUNTER — Emergency Department (HOSPITAL_COMMUNITY)
Admission: EM | Admit: 2012-10-16 | Discharge: 2012-10-17 | Disposition: A | Discharge status: Home / Self Care | Payer: Medicaid Other | Attending: Emergency Medicine | Admitting: Emergency Medicine

## 2012-10-16 ENCOUNTER — Encounter (HOSPITAL_COMMUNITY): Payer: Self-pay | Admitting: Emergency Medicine

## 2012-10-16 ENCOUNTER — Emergency Department (HOSPITAL_COMMUNITY): Payer: Medicaid Other

## 2012-10-16 DIAGNOSIS — Z853 Personal history of malignant neoplasm of breast: Secondary | ICD-10-CM | POA: Insufficient documentation

## 2012-10-16 DIAGNOSIS — Z87440 Personal history of urinary (tract) infections: Secondary | ICD-10-CM | POA: Insufficient documentation

## 2012-10-16 DIAGNOSIS — Z923 Personal history of irradiation: Secondary | ICD-10-CM | POA: Insufficient documentation

## 2012-10-16 DIAGNOSIS — Z8505 Personal history of malignant neoplasm of liver: Secondary | ICD-10-CM | POA: Insufficient documentation

## 2012-10-16 DIAGNOSIS — I10 Essential (primary) hypertension: Secondary | ICD-10-CM | POA: Insufficient documentation

## 2012-10-16 DIAGNOSIS — Z96649 Presence of unspecified artificial hip joint: Secondary | ICD-10-CM | POA: Insufficient documentation

## 2012-10-16 DIAGNOSIS — Z87448 Personal history of other diseases of urinary system: Secondary | ICD-10-CM | POA: Insufficient documentation

## 2012-10-16 DIAGNOSIS — Z85841 Personal history of malignant neoplasm of brain: Secondary | ICD-10-CM | POA: Insufficient documentation

## 2012-10-16 DIAGNOSIS — Z8639 Personal history of other endocrine, nutritional and metabolic disease: Secondary | ICD-10-CM | POA: Insufficient documentation

## 2012-10-16 DIAGNOSIS — Z8719 Personal history of other diseases of the digestive system: Secondary | ICD-10-CM | POA: Insufficient documentation

## 2012-10-16 DIAGNOSIS — C7952 Secondary malignant neoplasm of bone marrow: Secondary | ICD-10-CM | POA: Insufficient documentation

## 2012-10-16 DIAGNOSIS — C7951 Secondary malignant neoplasm of bone: Secondary | ICD-10-CM | POA: Insufficient documentation

## 2012-10-16 DIAGNOSIS — C799 Secondary malignant neoplasm of unspecified site: Secondary | ICD-10-CM

## 2012-10-16 LAB — POCT I-STAT, CHEM 8
Calcium, Ion: 1.23 mmol/L (ref 1.12–1.23)
Chloride: 102 mEq/L (ref 96–112)
Glucose, Bld: 83 mg/dL (ref 70–99)
HCT: 28 % — ABNORMAL LOW (ref 36.0–46.0)
TCO2: 27 mmol/L (ref 0–100)

## 2012-10-16 LAB — CBC WITH DIFFERENTIAL/PLATELET
Basophils Absolute: 0 10*3/uL (ref 0.0–0.1)
Basophils Relative: 0 % (ref 0–1)
Eosinophils Relative: 1 % (ref 0–5)
HCT: 27.5 % — ABNORMAL LOW (ref 36.0–46.0)
Hemoglobin: 8.5 g/dL — ABNORMAL LOW (ref 12.0–15.0)
MCH: 25.9 pg — ABNORMAL LOW (ref 26.0–34.0)
MCHC: 30.9 g/dL (ref 30.0–36.0)
MCV: 83.8 fL (ref 78.0–100.0)
Monocytes Absolute: 0.3 10*3/uL (ref 0.1–1.0)
Monocytes Relative: 6 % (ref 3–12)
Neutro Abs: 4.4 10*3/uL (ref 1.7–7.7)
RDW: 19.9 % — ABNORMAL HIGH (ref 11.5–15.5)

## 2012-10-16 MED ORDER — HYDROMORPHONE HCL PF 1 MG/ML IJ SOLN
1.0000 mg | Freq: Once | INTRAMUSCULAR | Status: AC
  Administered 2012-10-16: 1 mg via INTRAVENOUS
  Filled 2012-10-16: qty 1

## 2012-10-16 MED ORDER — SODIUM CHLORIDE 0.9 % IV SOLN
Freq: Once | INTRAVENOUS | Status: AC
  Administered 2012-10-16: 23:00:00 via INTRAVENOUS

## 2012-10-16 MED ORDER — ONDANSETRON HCL 4 MG/2ML IJ SOLN
4.0000 mg | Freq: Once | INTRAMUSCULAR | Status: AC
  Administered 2012-10-16: 4 mg via INTRAVENOUS
  Filled 2012-10-16: qty 2

## 2012-10-16 NOTE — ED Notes (Signed)
Pt c/o L hip pain x1 week. Reports possible injury.  Sts hip pain is radiating down leg.  Pt has hx of breast CA.

## 2012-10-16 NOTE — ED Notes (Signed)
Patient transported to X-ray 

## 2012-10-16 NOTE — ED Provider Notes (Signed)
History     CSN: [id number]  Arrival date & time 10/16/12  2155   First MD Initiated Contact with Patient 10/16/12 2225      Chief Complaint  Patient presents with  . Hip Pain    (Consider location/radiation/quality/duration/timing/severity/associated sxs/prior treatment) Patient is a 47 y.o. female presenting with hip pain. The history is provided by the patient.  Hip Pain This is a new problem. The current episode started in the past 7 days. The problem occurs constantly. The problem has been gradually worsening. Pertinent negatives include no abdominal pain, fever or rash.    Past Medical History  Diagnosis Date  . Hypertension   . GERD (gastroesophageal reflux disease)   . 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 Pyelonephritis 10/01/11  . Recurrent UTI (urinary tract infection) 10/01/11  . S/P chemotherapy, time since 4-12 weeks   . S/P radiation > 12 weeks 03/15/12- 04/07/12    right hip, clivus  . 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 Brain cancer     spread from breast ca  . Hypercalcemia 06/21/2012  . History of radiation therapy 08/06/10 -09/16/10    R supraclav fossa, R chest wall, R drain site  . radiaiton therapy 07/22/12 -08/15/12    T4-T7    Past Surgical History  Procedure Laterality Date  . Cesarean section  1989; 2010  . Mastectomy  2011    right  . Back surgery  2012    "removed tumor,cancer, from my spine  . Port-a-cath removal  2012    right chest  . Portacath placement  2011    right  . Tubal ligation  2010  . Breast biopsy  2011    right  . Hip arthroplasty  05/04/2012    Procedure: ARTHROPLASTY BIPOLAR HIP;  Surgeon: Shelda Pal, MD;  Location: [facility];  Service: Orthopedics;  Laterality: Right;  RIGHT HIP HEMI ARTHROPLASTY     Family History  Problem Relation Age of Onset  . Cancer Brother 74    died of colon cancer at age of 19  . High blood pressure Mother     History  Substance Use Topics  .  Smoking status: Former Smoker -- 0.25 packs/day for 31 years    Types: Cigarettes    Quit date: 04/26/2012  . Smokeless tobacco: Never Used  . Alcohol Use: No    OB History   Grav Para Term Preterm Abortions TAB SAB Ect Mult Living                  Review of Systems  Constitutional: Negative for fever.  Gastrointestinal: Negative for abdominal pain.  Skin: Negative for rash.    Allergies  Review of patient's allergies indicates no known allergies.  Home Medications   Current Outpatient Rx  Name  Route  Sig  Dispense  Refill  . cyclobenzaprine (FLEXERIL) 10 MG tablet   Oral   Take 1 tablet (10 mg total) by mouth 3 (three) times daily as needed for muscle spasms.   30 tablet   1   . dexamethasone (DECADRON) 4 MG tablet   Oral   Take 2 tablets (8 mg total) by mouth every 12 (twelve) hours.   60 tablet   0   . everolimus (AFINITOR) 5 MG tablet   Oral   Take 5 mg by mouth daily.         [name] exemestane (AROMASIN) 25  MG tablet   Oral   Take 25 mg by mouth daily after breakfast.         . feeding supplement (ENSURE COMPLETE) LIQD   Oral   Take 237 mLs by mouth 2 (two) times daily between meals.   24 Bottle   11   . ferrous sulfate 325 (65 FE) MG tablet   Oral   Take 325 mg by mouth 3 (three) times daily after meals.         . fluconazole (DIFLUCAN) 100 MG tablet   Oral   Take 1 tablet (100 mg total) by mouth daily.   301 tablet   1   . ondansetron (ZOFRAN) 4 MG tablet   Oral   Take 1 tablet (4 mg total) by mouth every 6 (six) hours.   30 tablet   1   . OxyCODONE HCl ER 60 MG T12A   Oral   Take 1 tablet by mouth 2 (two) times daily.   14 each   0   . oxyCODONE-acetaminophen (PERCOCET) 10-325 MG per tablet   Oral   Take 1-2 tablets by mouth every 6 (six) hours as needed for pain.   56 tablet   0   . pantoprazole (PROTONIX) 40 MG tablet   Oral   Take 1 tablet (40 mg total) by mouth daily.   30 tablet   3   . zolpidem (AMBIEN) 5 MG  tablet   Oral   Take 1 tablet (5 mg total) by mouth at bedtime as needed for sleep.   30 tablet   1     BP 174/100  Pulse 101  Temp(Src) 98.6 F (37 C) (Oral)  Resp 20  Ht 5\' 2"  (1.575 m)  Wt 113 lb (51.256 kg)  BMI 20.66 kg/m2  SpO2 100%  Physical Exam  ED Course  Procedures (including critical care time)  Labs Reviewed  CBC WITH DIFFERENTIAL  URINALYSIS, ROUTINE W REFLEX MICROSCOPIC   No results found.   No diagnosis found.    MDM  I spoke with Dr. Doree Albee, who Recommend the patient.  See Dr. Lynnell Grain tomorrow, and should be given a short-term prescription for 15 mg oxycodone.          Arman Filter, NP 10/17/12 [phone]

## 2012-10-17 ENCOUNTER — Other Ambulatory Visit: Payer: Medicaid Other | Admitting: Lab

## 2012-10-17 ENCOUNTER — Other Ambulatory Visit: Payer: Self-pay | Admitting: Oncology

## 2012-10-17 ENCOUNTER — Other Ambulatory Visit: Payer: Medicaid Other

## 2012-10-17 MED ORDER — OXYCODONE HCL 10 MG PO TABS
15.0000 mg | ORAL_TABLET | ORAL | Status: DC | PRN
Start: 2012-10-17 — End: 2012-10-25

## 2012-10-17 MED ORDER — ENSURE COMPLETE PO LIQD
237.0000 mL | ORAL | Status: AC
  Administered 2012-10-17: 237 mL via ORAL
  Filled 2012-10-17: qty 237

## 2012-10-17 MED ORDER — OXYCODONE HCL 5 MG PO TABS
15.0000 mg | ORAL_TABLET | Freq: Once | ORAL | Status: AC
  Administered 2012-10-17: 15 mg via ORAL
  Filled 2012-10-17: qty 1
  Filled 2012-10-17: qty 2

## 2012-10-17 MED ORDER — KETOROLAC TROMETHAMINE 30 MG/ML IJ SOLN
30.0000 mg | Freq: Once | INTRAMUSCULAR | Status: AC
  Administered 2012-10-17: 30 mg via INTRAVENOUS
  Filled 2012-10-17: qty 1

## 2012-10-17 MED ORDER — HYDROMORPHONE HCL PF 1 MG/ML IJ SOLN
1.0000 mg | Freq: Once | INTRAMUSCULAR | Status: AC
  Administered 2012-10-17: 1 mg via INTRAVENOUS
  Filled 2012-10-17: qty 1

## 2012-10-17 MED ORDER — OXYCODONE HCL 5 MG PO TABS: 10.0000 mg | ORAL_TABLET | Freq: Once | ORAL | Status: DC

## 2012-10-17 NOTE — ED Provider Notes (Signed)
Medical screening examination/treatment/procedure(s) were performed by non-physician practitioner and as supervising physician I was immediately available for consultation/collaboration.  John-Adam Julyan Gales, M.D.   John-Adam Prophet Renwick, MD 10/17/12 0703 

## 2012-10-18 ENCOUNTER — Other Ambulatory Visit: Payer: Medicaid Other

## 2012-10-19 ENCOUNTER — Other Ambulatory Visit: Payer: Self-pay | Admitting: *Deleted

## 2012-10-19 ENCOUNTER — Other Ambulatory Visit (HOSPITAL_BASED_OUTPATIENT_CLINIC_OR_DEPARTMENT_OTHER): Payer: Medicaid Other | Admitting: Lab

## 2012-10-19 DIAGNOSIS — C787 Secondary malignant neoplasm of liver and intrahepatic bile duct: Secondary | ICD-10-CM

## 2012-10-19 DIAGNOSIS — C7951 Secondary malignant neoplasm of bone: Secondary | ICD-10-CM

## 2012-10-19 DIAGNOSIS — C50919 Malignant neoplasm of unspecified site of unspecified female breast: Secondary | ICD-10-CM

## 2012-10-19 LAB — COMPREHENSIVE METABOLIC PANEL (CC13)
ALT: 18 U/L (ref 0–55)
AST: 55 U/L — ABNORMAL HIGH (ref 5–34)
Alkaline Phosphatase: 804 U/L — ABNORMAL HIGH (ref 40–150)
Potassium: 3.8 mEq/L (ref 3.5–5.1)
Sodium: 138 mEq/L (ref 136–145)
Total Bilirubin: 0.87 mg/dL (ref 0.20–1.20)
Total Protein: 6.9 g/dL (ref 6.4–8.3)

## 2012-10-19 LAB — CBC WITH DIFFERENTIAL/PLATELET
BASO%: 0.2 % (ref 0.0–2.0)
LYMPH%: 13.9 % — ABNORMAL LOW (ref 14.0–49.7)
MCHC: 31.4 g/dL — ABNORMAL LOW (ref 31.5–36.0)
MCV: 85.5 fL (ref 79.5–101.0)
MONO%: 5.3 % (ref 0.0–14.0)
Platelets: 356 10*3/uL (ref 145–400)
RBC: 3.39 10*6/uL — ABNORMAL LOW (ref 3.70–5.45)
RDW: 19.9 % — ABNORMAL HIGH (ref 11.2–14.5)
WBC: 6.3 10*3/uL (ref 3.9–10.3)

## 2012-10-19 MED ORDER — OXYCODONE-ACETAMINOPHEN 10-325 MG PO TABS: 1.0000 | ORAL_TABLET | Freq: Four times a day (QID) | ORAL | Status: DC | PRN

## 2012-10-19 MED ORDER — OXYCODONE HCL ER 60 MG PO T12A: 1.0000 | EXTENDED_RELEASE_TABLET | Freq: Two times a day (BID) | ORAL | Status: DC

## 2012-10-19 NOTE — Telephone Encounter (Signed)
Received call from patient stating she is out of medication and is in pain. Patient did state she was in the ED on 10/16/12 and was given rx for oxycodone but has not had it filled because she thought it would interfere with her rx from our office.  Explained to her that she has missed her appts. This week and she states she didn't know she had any, then immediately stated her aid didn't know and she had no way here. Per Dr. Darnelle Catalan she will bring in her rx from the ED and we will give her rx for her oxycontin and percocet today based on last week's urine.  She will give another urine today and based on these results will determine if she will get her rx next week. Patient is in agreement.   It is possible that the patient may be taking her oxycontin twice a day the day she gets her rx and therefore running out a day early.  Per Dr.Magrinat will increase oxycontin number to 16 instead of 14.  Will give patient instructions and calendar with her next appts.

## 2012-10-20 ENCOUNTER — Other Ambulatory Visit: Payer: Self-pay | Admitting: Physician Assistant

## 2012-10-20 LAB — URINE DRUGS OF ABUSE SCREEN W ALC, ROUTINE (REF LAB)
Amphetamine Screen, Ur: NEGATIVE
Barbiturate Quant, Ur: NEGATIVE
Marijuana Metabolite: NEGATIVE
Methadone: NEGATIVE
Propoxyphene: NEGATIVE

## 2012-10-25 ENCOUNTER — Inpatient Hospital Stay (HOSPITAL_COMMUNITY)
Admission: EM | Admit: 2012-10-25 | Discharge: 2012-10-29 | DRG: 948 | Disposition: A | Discharge status: Home / Self Care | Payer: Medicaid Other | Attending: Oncology | Admitting: Oncology

## 2012-10-25 ENCOUNTER — Other Ambulatory Visit: Payer: Medicaid Other

## 2012-10-25 ENCOUNTER — Ambulatory Visit: Payer: Medicaid Other | Admitting: Physician Assistant

## 2012-10-25 ENCOUNTER — Encounter (HOSPITAL_COMMUNITY): Payer: Self-pay | Admitting: Emergency Medicine

## 2012-10-25 DIAGNOSIS — K5900 Constipation, unspecified: Secondary | ICD-10-CM

## 2012-10-25 DIAGNOSIS — C7949 Secondary malignant neoplasm of other parts of nervous system: Secondary | ICD-10-CM | POA: Diagnosis present

## 2012-10-25 DIAGNOSIS — C50919 Malignant neoplasm of unspecified site of unspecified female breast: Secondary | ICD-10-CM

## 2012-10-25 DIAGNOSIS — M549 Dorsalgia, unspecified: Secondary | ICD-10-CM

## 2012-10-25 DIAGNOSIS — C787 Secondary malignant neoplasm of liver and intrahepatic bile duct: Secondary | ICD-10-CM | POA: Diagnosis present

## 2012-10-25 DIAGNOSIS — D649 Anemia, unspecified: Secondary | ICD-10-CM

## 2012-10-25 DIAGNOSIS — C7931 Secondary malignant neoplasm of brain: Secondary | ICD-10-CM | POA: Diagnosis present

## 2012-10-25 DIAGNOSIS — C7951 Secondary malignant neoplasm of bone: Secondary | ICD-10-CM | POA: Diagnosis present

## 2012-10-25 DIAGNOSIS — C799 Secondary malignant neoplasm of unspecified site: Secondary | ICD-10-CM

## 2012-10-25 DIAGNOSIS — Z923 Personal history of irradiation: Secondary | ICD-10-CM

## 2012-10-25 DIAGNOSIS — R112 Nausea with vomiting, unspecified: Secondary | ICD-10-CM | POA: Diagnosis present

## 2012-10-25 DIAGNOSIS — R52 Pain, unspecified: Secondary | ICD-10-CM

## 2012-10-25 DIAGNOSIS — I10 Essential (primary) hypertension: Secondary | ICD-10-CM

## 2012-10-25 DIAGNOSIS — Z66 Do not resuscitate: Secondary | ICD-10-CM | POA: Diagnosis present

## 2012-10-25 DIAGNOSIS — R109 Unspecified abdominal pain: Secondary | ICD-10-CM

## 2012-10-25 DIAGNOSIS — E46 Unspecified protein-calorie malnutrition: Secondary | ICD-10-CM | POA: Diagnosis present

## 2012-10-25 DIAGNOSIS — Z87891 Personal history of nicotine dependence: Secondary | ICD-10-CM

## 2012-10-25 DIAGNOSIS — G893 Neoplasm related pain (acute) (chronic): Principal | ICD-10-CM | POA: Diagnosis present

## 2012-10-25 DIAGNOSIS — K219 Gastro-esophageal reflux disease without esophagitis: Secondary | ICD-10-CM | POA: Diagnosis present

## 2012-10-25 DIAGNOSIS — D630 Anemia in neoplastic disease: Secondary | ICD-10-CM | POA: Diagnosis present

## 2012-10-25 DIAGNOSIS — Z170 Estrogen receptor positive status [ER+]: Secondary | ICD-10-CM

## 2012-10-25 DIAGNOSIS — B370 Candidal stomatitis: Secondary | ICD-10-CM | POA: Diagnosis present

## 2012-10-25 DIAGNOSIS — Z96649 Presence of unspecified artificial hip joint: Secondary | ICD-10-CM

## 2012-10-25 LAB — CBC WITH DIFFERENTIAL/PLATELET
Basophils Absolute: 0 10*3/uL (ref 0.0–0.1)
Eosinophils Absolute: 0 10*3/uL (ref 0.0–0.7)
Eosinophils Relative: 0 % (ref 0–5)
HCT: 27.5 % — ABNORMAL LOW (ref 36.0–46.0)
Lymphocytes Relative: 18 % (ref 12–46)
MCH: 27.5 pg (ref 26.0–34.0)
MCHC: 33.1 g/dL (ref 30.0–36.0)
MCV: 83.1 fL (ref 78.0–100.0)
Monocytes Absolute: 0.2 10*3/uL (ref 0.1–1.0)
RDW: 19.5 % — ABNORMAL HIGH (ref 11.5–15.5)

## 2012-10-25 LAB — URINALYSIS, ROUTINE W REFLEX MICROSCOPIC
Hgb urine dipstick: NEGATIVE
Nitrite: NEGATIVE
Specific Gravity, Urine: 1.009 (ref 1.005–1.030)
Urobilinogen, UA: 1 mg/dL (ref 0.0–1.0)

## 2012-10-25 LAB — COMPREHENSIVE METABOLIC PANEL
AST: 50 U/L — ABNORMAL HIGH (ref 0–37)
CO2: 27 mEq/L (ref 19–32)
Calcium: 9.2 mg/dL (ref 8.4–10.5)
Creatinine, Ser: 0.52 mg/dL (ref 0.50–1.10)
GFR calc Af Amer: >90 mL/min (ref >90)
GFR calc non Af Amer: >90 mL/min (ref >90)

## 2012-10-25 MED ORDER — HYDROMORPHONE HCL PF 2 MG/ML IJ SOLN
2.0000 mg | Freq: Once | INTRAMUSCULAR | Status: AC
  Administered 2012-10-25: 2 mg via INTRAVENOUS
  Filled 2012-10-25: qty 1

## 2012-10-25 MED ORDER — SODIUM CHLORIDE 0.9 % IJ SOLN
3.0000 mL | Freq: Two times a day (BID) | INTRAMUSCULAR | Status: DC
  Administered 2012-10-25: 3 mL via INTRAVENOUS

## 2012-10-25 MED ORDER — FLUCONAZOLE 100 MG PO TABS
100.0000 mg | ORAL_TABLET | Freq: Every day | ORAL | Status: DC
  Administered 2012-10-26 – 2012-10-28 (×3): 100 mg via ORAL
  Filled 2012-10-25 (×4): qty 1

## 2012-10-25 MED ORDER — CYCLOBENZAPRINE HCL 10 MG PO TABS
10.0000 mg | ORAL_TABLET | Freq: Three times a day (TID) | ORAL | Status: DC | PRN
  Filled 2012-10-25: qty 1

## 2012-10-25 MED ORDER — EXEMESTANE 25 MG PO TABS
25.0000 mg | ORAL_TABLET | Freq: Every day | ORAL | Status: DC
  Administered 2012-10-26 – 2012-10-28 (×3): 25 mg via ORAL
  Filled 2012-10-25 (×8): qty 1

## 2012-10-25 MED ORDER — SODIUM CHLORIDE 0.9 % IJ SOLN: 3.0000 mL | INTRAMUSCULAR | Status: DC | PRN

## 2012-10-25 MED ORDER — FENTANYL 100 MCG/HR TD PT72
100.0000 ug | MEDICATED_PATCH | TRANSDERMAL | Status: DC
  Administered 2012-10-25: 100 ug via TRANSDERMAL
  Filled 2012-10-25: qty 1

## 2012-10-25 MED ORDER — HYDROMORPHONE HCL PF 1 MG/ML IJ SOLN
0.5000 mg | Freq: Once | INTRAMUSCULAR | Status: AC
  Administered 2012-10-25: 0.5 mg via INTRAVENOUS
  Filled 2012-10-25: qty 1

## 2012-10-25 MED ORDER — SODIUM CHLORIDE 0.9 % IV SOLN: 250.0000 mL | INTRAVENOUS | Status: DC | PRN

## 2012-10-25 MED ORDER — HEPARIN SODIUM (PORCINE) 5000 UNIT/ML IJ SOLN
5000.0000 [IU] | Freq: Three times a day (TID) | INTRAMUSCULAR | Status: DC
  Administered 2012-10-25 – 2012-10-28 (×9): 5000 [IU] via SUBCUTANEOUS
  Filled 2012-10-25 (×14): qty 1

## 2012-10-25 MED ORDER — ZOLPIDEM TARTRATE 5 MG PO TABS
5.0000 mg | ORAL_TABLET | Freq: Every evening | ORAL | Status: DC | PRN
  Administered 2012-10-25 – 2012-10-26 (×2): 5 mg via ORAL
  Filled 2012-10-25 (×2): qty 1

## 2012-10-25 MED ORDER — EVEROLIMUS 5 MG PO TABS
5.0000 mg | ORAL_TABLET | Freq: Every day | ORAL | Status: DC
  Administered 2012-10-26 – 2012-10-28 (×2): 5 mg via ORAL
  Filled 2012-10-25 (×2): qty 1

## 2012-10-25 MED ORDER — ONDANSETRON HCL 4 MG/2ML IJ SOLN
4.0000 mg | Freq: Once | INTRAMUSCULAR | Status: AC
  Administered 2012-10-25: 4 mg via INTRAVENOUS
  Filled 2012-10-25: qty 2

## 2012-10-25 MED ORDER — OXYCODONE HCL 5 MG PO TABS
10.0000 mg | ORAL_TABLET | ORAL | Status: DC | PRN
  Administered 2012-10-25 – 2012-10-26 (×3): 10 mg via ORAL
  Filled 2012-10-25 (×3): qty 2

## 2012-10-25 MED ORDER — ENSURE PUDDING PO PUDG
1.0000 | Freq: Three times a day (TID) | ORAL | Status: DC
  Administered 2012-10-25 – 2012-10-28 (×8): 1 via ORAL
  Filled 2012-10-25 (×13): qty 1

## 2012-10-25 MED ORDER — ACETAMINOPHEN 325 MG PO TABS
325.0000 mg | ORAL_TABLET | ORAL | Status: DC | PRN
  Administered 2012-10-26 (×2): 325 mg via ORAL
  Filled 2012-10-25 (×2): qty 1

## 2012-10-25 MED ORDER — FERROUS SULFATE 325 (65 FE) MG PO TABS
325.0000 mg | ORAL_TABLET | Freq: Three times a day (TID) | ORAL | Status: DC
  Administered 2012-10-26 – 2012-10-28 (×7): 325 mg via ORAL
  Filled 2012-10-25 (×13): qty 1

## 2012-10-25 MED ORDER — PANTOPRAZOLE SODIUM 40 MG PO TBEC
40.0000 mg | DELAYED_RELEASE_TABLET | Freq: Every day | ORAL | Status: DC
  Administered 2012-10-25 – 2012-10-28 (×4): 40 mg via ORAL
  Filled 2012-10-25 (×5): qty 1

## 2012-10-25 MED ORDER — DEXAMETHASONE 4 MG PO TABS
8.0000 mg | ORAL_TABLET | Freq: Two times a day (BID) | ORAL | Status: DC
  Administered 2012-10-26: 8 mg via ORAL
  Filled 2012-10-25 (×3): qty 2

## 2012-10-25 MED ORDER — ENSURE COMPLETE PO LIQD
237.0000 mL | Freq: Three times a day (TID) | ORAL | Status: DC
  Administered 2012-10-25 – 2012-10-28 (×8): 237 mL via ORAL
  Filled 2012-10-25 (×2): qty 237

## 2012-10-25 NOTE — H&P (Signed)
Hospitalist Admission History and Physical  Patient name: [name] Medical record number: [id number] Date of birth: 1965/07/09 Age: 47 y.o. Gender: female  Primary Care Provider: Pcp Not In System  Chief Complaint: Intractable Pain  History of Present Illness:This is a 47 y.o. year old female with significant past medical history of metastatic breast cancer presenting with progressively worsening pain. Pt states that she has had progressive worsening pain over past 2-3 months. Pt is followed her at [facility]. As of last he marked note, patient has had multiple rounds of chemotherapy with still progressive metastatic disease and there is some concern about patient he possibly needing palliative care. Per patient, she has been seen weekly for pain management followups with hematology oncology. Patient is currently a pain contract. Per patient, she has been running out of medicines by the middle of the week because of worsening pain. Per patient, she has lost a lot of function in terms of being able to ambulate secondary to poor pain control. Patient is noted to have progressive and new metastatic lesions on most recent imaging. Patient denies any other major acute issues currently. Patient has had some mild nausea this been a chronic issue. However no other new issues no recent falls. No diarrhea no dysuria. Patient does have constipation is fairly chronic this is been fairly well managed. On arrival to the ER he was discussed with patient about outpatient followup for pain versus inpatient admission. Patient would like inpatient admission as well as palliative care consult.  Patient Active Problem List   Diagnosis Date Noted  . Ascites 10/12/2012  . radiaiton therapy   . Pathologic rib fracture 08/10/2012  . Influenza A (H1N1) with HCAP 08/10/2012  . Pleural effusion, bilateral 08/10/2012  . Thrush 08/10/2012  . Abdominal pain 08/04/2012  . Hypokalemia 08/04/2012  . Tachycardia  08/04/2012  . S/P radiation > 12 weeks   . Brain cancer   . History of radiation therapy   . Back pain 07/21/2012  . Anemia 07/21/2012  . Constipation 07/07/2012  . Hypercalcemia 06/21/2012  . GERD (gastroesophageal reflux disease) 07/15/2011  . Hypertension 07/15/2011  . Malignant neoplasm of breast (female), unspecified site 07/15/2011  . Bone metastases 04/07/2011  . 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 03/21/2011   Past Medical History: Past Medical History  Diagnosis Date  . Hypertension   . GERD (gastroesophageal reflux disease)   . 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 Pyelonephritis 10/01/11  . Recurrent UTI (urinary tract infection) 10/01/11  . S/P chemotherapy, time since 4-12 weeks   . S/P radiation > 12 weeks 03/15/12- 04/07/12    right hip, clivus  . 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 Brain cancer     spread from breast ca  . Hypercalcemia 06/21/2012  . History of radiation therapy 08/06/10 -09/16/10    R supraclav fossa, R chest wall, R drain site  . radiaiton therapy 07/22/12 -08/15/12    T4-T7    Past Surgical History: Past Surgical History  Procedure Laterality Date  . Cesarean section  1989; 2010  . Mastectomy  2011    right  . Back surgery  2012    "removed tumor,cancer, from my spine  . Port-a-cath removal  2012    right chest  . Portacath placement  2011    right  . Tubal ligation  2010  . Breast biopsy  2011    right  .  Hip arthroplasty  05/04/2012    Procedure: ARTHROPLASTY BIPOLAR HIP;  Surgeon: Shelda Pal, MD;  Location: [facility];  Service: Orthopedics;  Laterality: Right;  RIGHT HIP HEMI ARTHROPLASTY     Social History: History   Social History  . Marital Status: Single    Spouse Name: N/A    Number of Children: N/A  . Years of Education: N/A   Social History Main Topics  . Smoking status: Former Smoker -- 0.25 packs/day for 31 years    Types: Cigarettes    Quit date: 04/26/2012  .  Smokeless tobacco: Never Used  . Alcohol Use: No  . Drug Use: Yes    Special: "Crack" cocaine, Marijuana     Comment: 10/01/11 "last crack ~ 3 days ago; use probably $50/wk"  . Sexually Active: Not Currently   Other Topics Concern  . None   Social History Narrative  . None    Family History: Family History  Problem Relation Age of Onset  . Cancer Brother 38    died of colon cancer at age of 45  . High blood pressure Mother     Allergies: No Known Allergies  Current Facility-Administered Medications  Medication Dose Route Frequency Provider Last Rate Last Dose  . 0.9 %  sodium chloride infusion  250 mL Intravenous PRN Doree Albee, MD      . cyclobenzaprine (FLEXERIL) tablet 10 mg  10 mg Oral TID PRN Doree Albee, MD      . Melene Muller ON 10/26/2012] dexamethasone (DECADRON) tablet 8 mg  8 mg Oral BID WC Doree Albee, MD      . everolimus Cleon Gustin) tablet 5 mg  5 mg Oral Daily Doree Albee, MD      . Melene Muller ON 10/26/2012] exemestane (AROMASIN) tablet 25 mg  25 mg Oral QPC breakfast Doree Albee, MD      . fentaNYL (DURAGESIC - dosed mcg/hr) 100 mcg  100 mcg Transdermal Q72H Doree Albee, MD      . Melene Muller ON 10/26/2012] ferrous sulfate tablet 325 mg  325 mg Oral TID PC Doree Albee, MD      . fluconazole (DIFLUCAN) tablet 100 mg  100 mg Oral Daily Doree Albee, MD      . heparin injection 5,000 Units  5,000 Units Subcutaneous Q8H Doree Albee, MD      . oxyCODONE (Oxy IR/ROXICODONE) immediate release tablet 10 mg  10 mg Oral Q4H PRN Doree Albee, MD      . pantoprazole (PROTONIX) EC tablet 40 mg  40 mg Oral Daily Doree Albee, MD      . sodium chloride 0.9 % injection 3 mL  3 mL Intravenous Q12H Doree Albee, MD      . sodium chloride 0.9 % injection 3 mL  3 mL Intravenous PRN Doree Albee, MD      . zolpidem (AMBIEN) tablet 5 mg  5 mg Oral QHS PRN Doree Albee, MD       Current Outpatient Prescriptions  Medication Sig Dispense Refill  . cyclobenzaprine (FLEXERIL) 10 MG  tablet Take 10 mg by mouth 3 (three) times daily as needed for muscle spasms.      [name] dexamethasone (DECADRON) 4 MG tablet Take 8 mg by mouth 2 (two) times daily with a meal.      . everolimus (AFINITOR) 5 MG tablet Take 5 mg by mouth daily.      [name] exemestane (AROMASIN) 25 MG tablet Take 25 mg by mouth daily after breakfast.      .  ferrous sulfate 325 (65 FE) MG tablet Take 325 mg by mouth 3 (three) times daily after meals.      . fluconazole (DIFLUCAN) 100 MG tablet Take 100 mg by mouth daily.      . Oxycodone HCl 10 MG TABS Take 15 mg by mouth 4 (four) times daily as needed.      . OxyCODONE HCl ER 60 MG T12A Take 1 tablet by mouth 2 (two) times daily.      [name] oxyCODONE-acetaminophen (PERCOCET) 10-325 MG per tablet Take 1-2 tablets by mouth every 4 (four) hours as needed for pain.      . pantoprazole (PROTONIX) 40 MG tablet Take 40 mg by mouth daily.      [name] zolpidem (AMBIEN) 5 MG tablet Take 5 mg by mouth at bedtime as needed for sleep.      . [DISCONTINUED] Alum & Mag Hydroxide-Simeth (MAGIC MOUTHWASH W/LIDOCAINE) SOLN Take 5 mLs by mouth 3 (three) times daily as needed (take 15 minutes prior to meals.).  480 mL  0   Review Of Systems: 12 point ROS negative except as noted above in HPI.  Physical Exam: Filed Vitals:   10/25/12 1710  BP: 170/82  Pulse: 92  Temp: 97.9 F (36.6 C)  Resp: 16    General: alert, cooperative and cachectic appearing, tearful  HEENT: PERRLA and extra ocular movement intact Heart: S1, S2 normal, no murmur, rub or gallop, regular rate and rhythm Lungs: decreased breath sounds diffusely, no focal abnormality  Abdomen:+ mild abdominal distension with hepatomegaly, no focal tenderness Extremities: extremities normal, atraumatic, no cyanosis or edema Skin:no rashes Neurology: normal without focal findings  Labs and Imaging: Lab Results  Component Value Date/Time   NA 141 10/25/2012  5:40 PM   NA 138 10/19/2012  2:00 PM   K 3.5 10/25/2012  5:40 PM   K 3.8  10/19/2012  2:00 PM   CL 103 10/25/2012  5:40 PM   CL 101 10/19/2012  2:00 PM   CO2 27 10/25/2012  5:40 PM   CO2 27 10/19/2012  2:00 PM   BUN 6 10/25/2012  5:40 PM   BUN 6.0* 10/19/2012  2:00 PM   CREATININE 0.52 10/25/2012  5:40 PM   CREATININE 0.7 10/19/2012  2:00 PM   GLUCOSE 88 10/25/2012  5:40 PM   GLUCOSE 119* 10/19/2012  2:00 PM   Lab Results  Component Value Date   WBC 4.6 10/25/2012   HGB 9.1* 10/25/2012   HCT 27.5* 10/25/2012   MCV 83.1 10/25/2012   PLT 393 10/25/2012    No results found.   Assessment and Plan: SUNDY HOUCHINS is a 47 y.o. year old female presenting with worsening pain in setting of progressive metastatic breast cancer   Pain: Will transition pt from oral regimen to fentanyl patch at 1/3rd total daily dose with oxycodone 10mg  q4hr prn for breakthrough pain. Titrate based on clinical response. Discussed regimen with pharmacy. Palliative care consult.   Metastatic breast cancer: Continue decadron, afintitor, aromasin. Hem Onc consult in am.   Anemia: hgb around 9. Above baseline. ACD. Continue iron   Thrush: continue diflucan  FEN/GI: regular diet. PPI Prophylaxis: sub q heparin  Disposition: pending palliative care consult.  Code Status:DNR       Doree Albee MD  Pager: [phone]

## 2012-10-25 NOTE — ED Notes (Signed)
PT states she has been having increasing generalized body pain for the past few weeks.  Pt states she takes her pain medication, but her tolerance has gotten so high, she is still having severe pain.  Pt states she threw up this am, denies diarrhea.

## 2012-10-25 NOTE — ED Provider Notes (Addendum)
History     CSN: [id number]  Arrival date & time 10/25/12  1651   First MD Initiated Contact with Patient 10/25/12 1715      Chief Complaint  Patient presents with  . Pain    (Consider location/radiation/quality/duration/timing/severity/associated sxs/prior treatment) HPI Comments: Patient with a history of metastatic breast cancer presents today for worsening generalized body pain which is most pronounced in the left hip. She recently discovered that she has new metastatic lesions in her left iliac crest. She currently is taking oxycodone and OxyContin without significant relief. She repeatedly states that she wants to stay in the hospital to get her pain controlled because she is unable to function at home. She does complain of vomiting today only but denies fever or change in bowel habits.  She is crying on exam and states that they're doing nothing for her cancer any longer and they're not controlling her pain either she is not currently being seen in the pain management clinic or hospice  The history is provided by the patient and a relative.    Past Medical History  Diagnosis Date  . Hypertension   . GERD (gastroesophageal reflux disease)   . 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 Pyelonephritis 10/01/11  . Recurrent UTI (urinary tract infection) 10/01/11  . S/P chemotherapy, time since 4-12 weeks   . S/P radiation > 12 weeks 03/15/12- 04/07/12    right hip, clivus  . 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 Brain cancer     spread from breast ca  . Hypercalcemia 06/21/2012  . History of radiation therapy 08/06/10 -09/16/10    R supraclav fossa, R chest wall, R drain site  . radiaiton therapy 07/22/12 -08/15/12    T4-T7    Past Surgical History  Procedure Laterality Date  . Cesarean section  1989; 2010  . Mastectomy  2011    right  . Back surgery  2012    "removed tumor,cancer, from my spine  . Port-a-cath removal  2012    right  chest  . Portacath placement  2011    right  . Tubal ligation  2010  . Breast biopsy  2011    right  . Hip arthroplasty  05/04/2012    Procedure: ARTHROPLASTY BIPOLAR HIP;  Surgeon: Shelda Pal, MD;  Location: [facility];  Service: Orthopedics;  Laterality: Right;  RIGHT HIP HEMI ARTHROPLASTY     Family History  Problem Relation Age of Onset  . Cancer Brother 10    died of colon cancer at age of 70  . High blood pressure Mother     History  Substance Use Topics  . Smoking status: Former Smoker -- 0.25 packs/day for 31 years    Types: Cigarettes    Quit date: 04/26/2012  . Smokeless tobacco: Never Used  . Alcohol Use: No    OB History   Grav Para Term Preterm Abortions TAB SAB Ect Mult Living                  Review of Systems  Constitutional: Negative for fever.  Respiratory: Negative for cough and shortness of breath.   Gastrointestinal: Positive for nausea, vomiting and abdominal distention. Negative for diarrhea.  Genitourinary: Negative for dysuria.  Musculoskeletal: Positive for myalgias and back pain.  All other systems reviewed and are negative.    Allergies  Review of patient's allergies indicates no known allergies.  Home Medications  Current Outpatient Rx  Name  Route  Sig  Dispense  Refill  . cyclobenzaprine (FLEXERIL) 10 MG tablet   Oral   Take 1 tablet (10 mg total) by mouth 3 (three) times daily as needed for muscle spasms.   30 tablet   1   . dexamethasone (DECADRON) 4 MG tablet   Oral   Take 2 tablets (8 mg total) by mouth every 12 (twelve) hours.   60 tablet   0   . everolimus (AFINITOR) 5 MG tablet   Oral   Take 5 mg by mouth daily.         [name] exemestane (AROMASIN) 25 MG tablet   Oral   Take 25 mg by mouth daily after breakfast.         . ferrous sulfate 325 (65 FE) MG tablet   Oral   Take 325 mg by mouth 3 (three) times daily after meals.         . fluconazole (DIFLUCAN) 100 MG tablet   Oral   Take 1 tablet (100 mg  total) by mouth daily.   301 tablet   1   . ondansetron (ZOFRAN) 4 MG tablet   Oral   Take 1 tablet (4 mg total) by mouth every 6 (six) hours.   30 tablet   1   . oxyCODONE 10 MG TABS   Oral   Take 1.5 tablets (15 mg total) by mouth every 4 (four) hours as needed for pain.   20 tablet   0   . OxyCODONE HCl ER 60 MG T12A   Oral   Take 1 tablet by mouth 2 (two) times daily.   16 each   0   . oxyCODONE-acetaminophen (PERCOCET) 10-325 MG per tablet   Oral   Take 1-2 tablets by mouth every 6 (six) hours as needed for pain.   56 tablet   0   . pantoprazole (PROTONIX) 40 MG tablet   Oral   Take 1 tablet (40 mg total) by mouth daily.   30 tablet   3   . zolpidem (AMBIEN) 5 MG tablet   Oral   Take 1 tablet (5 mg total) by mouth at bedtime as needed for sleep.   30 tablet   1     BP 170/82  Pulse 92  Temp(Src) 97.9 F (36.6 C) (Oral)  Resp 16  SpO2 99%  Physical Exam  Nursing note and vitals reviewed. Constitutional: She is oriented to person, place, and time. She appears well-developed. She appears cachectic. She appears distressed.  Tearful on exam  HENT:  Head: Normocephalic and atraumatic.  Mouth/Throat: Oropharynx is clear and moist. Mucous membranes are dry.  Eyes: Conjunctivae and EOM are normal. Pupils are equal, round, and reactive to light.  Neck: Normal range of motion. Neck supple.  Cardiovascular: Normal rate, regular rhythm and intact distal pulses.   No murmur heard. Pulmonary/Chest: Effort normal and breath sounds normal. No respiratory distress. She has no wheezes. She has no rales.  Abdominal: Soft. Normal appearance. She exhibits distension. There is splenomegaly and hepatomegaly. There is tenderness. There is no rebound and no guarding.  Musculoskeletal: Normal range of motion. She exhibits tenderness. She exhibits no edema.  Tenderness to bilateral hips on palpation.  Neurological: She is alert and oriented to person, place, and time.  Skin:  Skin is warm and dry. No rash noted. No erythema.  Psychiatric: She has a normal mood and affect. Her behavior is normal.  ED Course  Procedures (including critical care time)  Labs Reviewed  CBC WITH DIFFERENTIAL - Abnormal; Notable for the following:    RBC 3.31 (*)    Hemoglobin 9.1 (*)    HCT 27.5 (*)    RDW 19.5 (*)    All other components within normal limits  COMPREHENSIVE METABOLIC PANEL - Abnormal; Notable for the following:    Albumin 2.8 (*)    AST 50 (*)    Alkaline Phosphatase 897 (*)    All other components within normal limits  URINALYSIS, ROUTINE W REFLEX MICROSCOPIC   No results found.   1. Metastatic cancer   2. Uncontrolled pain       MDM   Patient with an extensive history of metastatic cancer primary origin in breast. Patient returns again today due to severe pain not controlled by the oxycodone and OxyContin. She states she hurts all the time and cannot cannot pain. Based on the most recent note by her oncologist last week they have tried multiple rounds of chemotherapy and radiation without improvement in symptoms. In their note they felt that tablet of care and hospice would be the best avenue for the patient as she has diffuse disease in her liver, bones and brain. Today she is complaining of worsening pain but denies any neurologic deficits. She has firm tense abdomen which appears to be her normal. She states she vomits often but is not any different. She repeatedly states she does not want to go home because she hurts too badly and wants admission for pain control. Patient given IV pain control here CBC, CMP and UA pending.  7:54 PM Labs are unchanged. spoke with the hospitalist and he will come to see the patient and discuss possible increase in pain medication and outpatient palliative care versus admission      Gwyneth Sprout, MD 10/25/12 1955  Gwyneth Sprout, MD 10/25/12 2043

## 2012-10-26 ENCOUNTER — Inpatient Hospital Stay (HOSPITAL_COMMUNITY): Payer: Medicaid Other

## 2012-10-26 ENCOUNTER — Encounter (HOSPITAL_COMMUNITY): Payer: Self-pay

## 2012-10-26 DIAGNOSIS — C7951 Secondary malignant neoplasm of bone: Secondary | ICD-10-CM

## 2012-10-26 DIAGNOSIS — C787 Secondary malignant neoplasm of liver and intrahepatic bile duct: Secondary | ICD-10-CM

## 2012-10-26 DIAGNOSIS — G893 Neoplasm related pain (acute) (chronic): Principal | ICD-10-CM

## 2012-10-26 DIAGNOSIS — C50919 Malignant neoplasm of unspecified site of unspecified female breast: Secondary | ICD-10-CM

## 2012-10-26 LAB — CBC WITH DIFFERENTIAL/PLATELET
Eosinophils Relative: 1 % (ref 0–5)
Lymphocytes Relative: 18 % (ref 12–46)
Lymphs Abs: 0.7 10*3/uL (ref 0.7–4.0)
MCV: 84.4 fL (ref 78.0–100.0)
Platelets: 330 10*3/uL (ref 150–400)
RBC: 3.08 MIL/uL — ABNORMAL LOW (ref 3.87–5.11)
WBC: 3.7 10*3/uL — ABNORMAL LOW (ref 4.0–10.5)

## 2012-10-26 LAB — COMPREHENSIVE METABOLIC PANEL
Albumin: 2.5 g/dL — ABNORMAL LOW (ref 3.5–5.2)
BUN: 11 mg/dL (ref 6–23)
Calcium: 9.1 mg/dL (ref 8.4–10.5)
Chloride: 100 mEq/L (ref 96–112)
Creatinine, Ser: 0.58 mg/dL (ref 0.50–1.10)
Total Bilirubin: 0.5 mg/dL (ref 0.3–1.2)
Total Protein: 6.2 g/dL (ref 6.0–8.3)

## 2012-10-26 MED ORDER — DOCUSATE SODIUM 100 MG PO CAPS
200.0000 mg | ORAL_CAPSULE | Freq: Two times a day (BID) | ORAL | Status: DC
  Administered 2012-10-26 – 2012-10-28 (×5): 200 mg via ORAL
  Filled 2012-10-26 (×8): qty 2

## 2012-10-26 MED ORDER — HYDROMORPHONE HCL PF 1 MG/ML IJ SOLN: 1.0000 mg | INTRAMUSCULAR | Status: DC | PRN

## 2012-10-26 MED ORDER — DEXAMETHASONE SODIUM PHOSPHATE 4 MG/ML IJ SOLN
4.0000 mg | Freq: Two times a day (BID) | INTRAMUSCULAR | Status: DC
  Administered 2012-10-26 – 2012-10-28 (×5): 4 mg via INTRAVENOUS
  Filled 2012-10-26 (×7): qty 1

## 2012-10-26 MED ORDER — OXYCODONE-ACETAMINOPHEN 5-325 MG PO TABS
2.0000 | ORAL_TABLET | ORAL | Status: DC | PRN
  Administered 2012-10-26 – 2012-10-29 (×9): 2 via ORAL
  Filled 2012-10-26 (×10): qty 2

## 2012-10-26 MED ORDER — DOCUSATE SODIUM 100 MG PO CAPS
100.0000 mg | ORAL_CAPSULE | Freq: Two times a day (BID) | ORAL | Status: DC
  Filled 2012-10-26: qty 1

## 2012-10-26 MED ORDER — HYDROMORPHONE HCL PF 1 MG/ML IJ SOLN
1.0000 mg | Freq: Once | INTRAMUSCULAR | Status: AC
  Administered 2012-10-26: 1 mg via INTRAVENOUS
  Filled 2012-10-26: qty 1

## 2012-10-26 MED ORDER — DEXTROSE-NACL 5-0.45 % IV SOLN
INTRAVENOUS | Status: DC
  Administered 2012-10-26: 15:00:00 via INTRAVENOUS
  Administered 2012-10-27: 75 mL/h via INTRAVENOUS
  Administered 2012-10-27 – 2012-10-29 (×2): via INTRAVENOUS

## 2012-10-26 MED ORDER — HYDROMORPHONE HCL PF 2 MG/ML IJ SOLN
2.0000 mg | INTRAMUSCULAR | Status: DC | PRN
  Administered 2012-10-26 – 2012-10-27 (×6): 2 mg via INTRAVENOUS
  Filled 2012-10-26 (×7): qty 1

## 2012-10-26 MED ORDER — GADOBENATE DIMEGLUMINE 529 MG/ML IV SOLN
9.0000 mL | Freq: Once | INTRAVENOUS | Status: AC | PRN
  Administered 2012-10-26: 9 mL via INTRAVENOUS

## 2012-10-26 NOTE — Consult Note (Signed)
Progress Notes    ID: AUSET FRITZLER   DOB: [birth year]  MR#: [id number]  WUJ#:[id number]    PCP: Pcp Not In System   HISTORY OF PRESENT ILLNESS: The patient delivered a child on 05/17/2009, after which she felt fullness in the right breast. She eventually brought this to her doctors attention and was scheduled for mammography and ultrasound on 03/08/2009. There were no prior exams for comparison. Mammography showed the breast to be dense, with a 5 cm obscured mass in the outer midportion of the right breast. Mass was easily palpable, and there was also a mass in the outer right periareolar region. They were suspicious microcalcifications in the outer midportion of the right breast, as well as within the mass itself. Ultrasound showed an extensive heterogeneous area, corresponding to the palpable abnormality with multiple solid and cystic foci. Overall, the mass measured in excess of 5 cm, in addition to cysts in the breast.   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 The tumors were ER +100%, PR +80%.   Bilateral breast MRI was obtained 03/22/2009. There was a large area of abnormal enhancement in the lower outer quadrant of the right breast measuring up to 9.7 cm. No abnormal appearing lymph nodes and no other masses in either breast.   Patient underwent definitive right mastectomy and sentinel lymph node sampling on 04/29/2009 under the care of Dr. Derrell Lolling. 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 Repeat prognostic panel showed tumor to be ER +93% and PR +51%. HER-2/neu was negative with a ratio of 0.71, with proliferation marker of 36%.   Patient was evaluated by Dr. Darnelle Catalan in January 2011 but failed to keep appointments until she was seen here again in July 2011. In July, she began adjuvant  chemotherapy. She received 4 cycles of dose dense doxorubicin and cyclophosphamide followed by 4 weekly doses of paclitaxel which was discontinued in November 2011 secondary to peripheral neuropathy. The patient then received radiation therapy to the right chest wall which was truncated due to problems with missing appointments.   Patient was started on tamoxifen briefly in June of 2012 which was discontinued due to nausea. The nausea continued, and an abdominal CT in October 2012 confirmed a growth in liver lesions. These were biopsied on 03/10/2011 and showed metastatic adenocarcinoma, still ER positive at 100%, PR +5%. Her subsequent treatments are as detailed below   INTERVAL HISTORY: [name] has been readmitted with uncontrolled pain. She initially apparently requested a palliative care consult but today she tells me she wants to continue her medications including the affinitor. I have cancelled that consult.  REVIEW OF SYSTEMS: The main problem continues to be pain, particularly involving the R leg. Of course that is where she had the hip replacement, but it is hurting in the leg not the hip, and there is a "bump" on her back that has come on. She denies severe headaches, visual changes, nausea, or vomiting. She remains severely constipated. She does not think she needs palliative paracentesis at present. No family in room  PAST MEDICAL HISTORY: Past Medical History   Diagnosis  Date   .  Hypertension     .  GERD (gastroesophageal reflux disease)     .  Bone metastases  2012       "spread from my breast"   .  Pyelonephritis  10/01/11   .  Recurrent UTI (urinary tract infection)  10/01/11   .  S/P chemotherapy, time since 4-12 weeks     .  S/P radiation > 12 weeks  03/15/12- 04/07/12       right hip, clivus   .  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  03/21/2011       bone and brain   .  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  Hypercalcemia  06/21/2012   .  History of radiation therapy   08/06/10 -09/16/10       R supraclav fossa, R chest wall, R drain site   .  radiaiton therapy  07/22/12 -08/15/12       T4-T7        PAST SURGICAL HISTORY: Past Surgical History   Procedure  Laterality  Date   .  Cesarean section    1989; 2010   .  Mastectomy    2011       right   .  Back surgery    2012       "removed tumor,cancer, from my spine   .  Port-a-cath removal    2012       right chest   .  Portacath placement    2011       right   .  Tubal ligation    2010   .  Breast biopsy    2011       right   .  Hip arthroplasty    05/04/2012       Procedure: ARTHROPLASTY BIPOLAR HIP;  Surgeon: Shelda Pal, MD;  Location: [facility];  Service: Orthopedics;  Laterality: Right;  RIGHT HIP HEMI ARTHROPLASTY         FAMILY HISTORY The patient's father is alive at age 87.  The patient's mother is alive at age 38.  She has four sisters.  She had one brother who died from colon cancer at the age of 49.  There is no breast or ovarian cancer history in the family to her knowledge.   GYNECOLOGIC HISTORY: She is GX P2.  First pregnancy to term at age 89.     SOCIAL HISTORY: The patient is not employed.  She lives by herself (her small son whose name is [name] is currently staying with his dad in [place]).  Her first child, [name], is studying business in this area.  The patient has one grandchild.  She attends the [employer].               ADVANCED DIRECTIVES: not in place   HEALTH MAINTENANCE: History   Substance Use Topics   .  Smoking status:  Former Smoker -- 0.25 packs/day for 31 years       Types:  Cigarettes       Quit date:  04/26/2012   .  Smokeless tobacco:  Never Used   .  Alcohol Use:  No     No Known Allergies    Current Outpatient Prescriptions   Medication  Sig  Dispense  Refill   .  cyclobenzaprine (FLEXERIL) 10 MG tablet  Take 1 tablet (10 mg total) by mouth 3 (three) times daily as needed for muscle spasms.   30 tablet   1   .  dexamethasone  (DECADRON) 4 MG tablet  Take 2 tablets (8 mg total) by mouth every 12 (twelve) hours.   60 tablet  0   .  everolimus (AFINITOR) 5 MG tablet  Take 5 mg by mouth daily.         [name]  exemestane (AROMASIN) 25 MG tablet  Take 25 mg by mouth daily after breakfast.         .  feeding supplement (ENSURE COMPLETE) LIQD  Take 237 mLs by mouth 2 (two) times daily between meals.   24 Bottle   11   .  ferrous sulfate 325 (65 FE) MG tablet  Take 325 mg by mouth 3 (three) times daily after meals.         .  fluconazole (DIFLUCAN) 100 MG tablet  Take 1 tablet (100 mg total) by mouth daily.   301 tablet   1   .  ondansetron (ZOFRAN) 4 MG tablet  Take 1 tablet (4 mg total) by mouth every 6 (six) hours.   30 tablet   1   .  OxyCODONE HCl ER 60 MG T12A  Take 1 tablet by mouth 2 (two) times daily.   14 each   0   .  oxyCODONE-acetaminophen (PERCOCET) 10-325 MG per tablet  Take 1-2 tablets by mouth every 6 (six) hours as needed for pain.   56 tablet   0   .  pantoprazole (PROTONIX) 40 MG tablet  Take 1 tablet (40 mg total) by mouth daily.   30 tablet   3   .  zolpidem (AMBIEN) 5 MG tablet  Take 1 tablet (5 mg total) by mouth at bedtime as needed for sleep.   30 tablet   1   .  [DISCONTINUED] Alum & Mag Hydroxide-Simeth (MAGIC MOUTHWASH W/LIDOCAINE) SOLN  Take 5 mLs by mouth 3 (three) times daily as needed (take 15 minutes prior to meals.).   480 mL   0       Scheduled Meds: . dexamethasone  8 mg Oral BID WC  . docusate sodium  200 mg Oral BID  . everolimus  5 mg Oral Daily  . exemestane  25 mg Oral QPC breakfast  . feeding supplement  237 mL Oral TID BM  . feeding supplement  1 Container Oral TID BM  . fentaNYL  100 mcg Transdermal Q72H  . ferrous sulfate  325 mg Oral TID PC  . fluconazole  100 mg Oral Daily  . heparin  5,000 Units Subcutaneous Q8H  . pantoprazole  40 mg Oral Daily   Continuous Infusions:  PRN Meds:.cyclobenzaprine, HYDROmorphone (DILAUDID) injection, oxyCODONE-acetaminophen, zolpidem         OBJECTIVE: Middle-aged [place] American woman examined in bed   Filed Vitals:   10/26/12 0500  BP: 142/83  Pulse: 90  Temp: 98.6 F (37 C)  Resp: 18    Sclerae unicteric Oropharynx clear No cervical or supraclavicular adenopathy Lungs diminished breath sounds bilaterallyr, greater on the right than the left Heart regular rate and rhythm Abdomen shows 2+ ascites, +BS, NT 1+ pending edema bilaterally in the lower extremities, equal bilaterally. No upper extremity edema Neuro: nonfocal, well oriented, stressed affect Breasts: Deferred     LAB RESULTS: Basic Metabolic Panel:  Recent Labs Lab 10/19/12 1400 10/25/12 1740 10/26/12 0530  NA 138 141 139  K 3.8 3.5 3.5  CL 101 103 100  CO2 27 27 28   GLUCOSE 119* 88 101*  BUN 6.0* 6 11  CREATININE 0.7 0.52 0.58  CALCIUM 9.6 9.2 9.1   GFR Estimated Creatinine Clearance: 66.3 ml/min (by C-G formula based on Cr of 0.58). Liver  Function Tests:  Recent Labs Lab 10/19/12 1400 10/25/12 1740 10/26/12 0530  AST 55* 50* 55*  ALT 18 15 16   ALKPHOS 804* 897* 915*  BILITOT 0.87 0.6 0.5  PROT 6.9 6.8 6.2  ALBUMIN 2.9* 2.8* 2.5*   No results found for this basename: LIPASE, AMYLASE,  in the last 168 hours No results found for this basename: AMMONIA,  in the last 168 hours Coagulation profile No results found for this basename: INR, PROTIME,  in the last 168 hours  CBC:  Recent Labs Lab 10/19/12 1400 10/25/12 1740 10/26/12 0530  WBC 6.3 4.6 3.7*  NEUTROABS 5.0 3.5 2.6  HGB 9.1* 9.1* 8.4*  HCT 29.0* 27.5* 26.0*  MCV 85.5 83.1 84.4  PLT 356 393 330   Cardiac Enzymes: No results found for this basename: CKTOTAL, CKMB, CKMBINDEX, TROPONINI,  in the last 168 hours BNP: No components found with this basename: POCBNP,  CBG: No results found for this basename: GLUCAP,  in the last 168 hours D-Dimer No results found for this basename: DDIMER,  in the last 72 hours Hgb A1c No results found for this basename:  HGBA1C,  in the last 72 hours Lipid Profile No results found for this basename: CHOL, HDL, LDLCALC, TRIG, CHOLHDL, LDLDIRECT,  in the last 72 hours Thyroid function studies No results found for this basename: TSH, T4TOTAL, FREET3, T3FREE, THYROIDAB,  in the last 72 hours Anemia work up No results found for this basename: VITAMINB12, FOLATE, FERRITIN, TIBC, IRON, RETICCTPCT,  in the last 72 hours Microbiology No results found for this or any previous visit (from the past 240 hour(s)).   STUDIES:   Dg Hip Complete Left   09/27/2012   *RADIOLOGY REPORT*  Clinical Data: History of painful left hip.  History of painful fever.  History of metastatic breast carcinoma.  LEFT HIP - COMPLETE 2+ VIEW Comparison: CT 08/06/2012.  Findings: On the CT there is some subtle blastic disease seen in the medial aspect of the left iliac bone.  There is also slight increased density and sclerosis on the plain imaging area.  No pathological fracture is evident.  No definite lytic or blastic process is seen in the left proximal femur or left hip.  Previous right hip arthroplasty has been performed without evidence of disruption of hardware.  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  IMPRESSION: Blastic metastatic disease involving the medial aspect of the left iliac bone.  No definite blastic or lytic lesion is seen within the proximal femur or hip.  No pathological fractures evident. Post right hip arthroplasty.  Calcified uterine leiomyomas.   Original Report Authenticated By: Onalee Hua Call    Dg Femur Left   09/27/2012   *RADIOLOGY REPORT*  Clinical Data: History of pain in the left femur.  No known injury. History of metastatic breast carcinoma.  LEFT FEMUR - 2 VIEW  Comparison: 08/04/2012.  Findings: No definite lytic or blastic lesion is seen within the femur.  No fracture dislocation is evident.  No soft tissue lesion is seen.  IMPRESSION: No femoral lesion is evident.   Original Report Authenticated By:  Onalee Hua Call    Dg Abd 1 View   09/27/2012   *RADIOLOGY REPORT*  Clinical Data: Metastatic breast cancer.  Abdominal discomfort and distension.  ABDOMEN - 1 VIEW  Comparison: 09/01/2012  Findings: Diffuse osseous metastatic disease.  Enlarged liver displacing bowel.  Nonspecific bowel gas pattern with gas filled bowel left lower quadrant of the abdomen/pelvis possibly representing colon rather than distended  small bowel loops.  IMPRESSION: Enlarged liver with displacement of bowel.  No definitive findings of bowel obstruction.  Diffuse osseous metastatic disease.   Original Report Authenticated By: Lacy Duverney, M.D.        ASSESSMENT:    47 y.o. [place] woman     1. 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      2.Status post adjuvant chemotherapy July to November 2011, consisting of 4 cycles of dose dense doxorubicin and cyclophosphamide, followed by 4 of 12 planned weekly doses of paclitaxel given adjuvantly, discontinued secondary to peripheral neuropathy.      3. Status post radiation to the right chest wall, truncated due to problems with the patient missing appointments.     4. Tamoxifen started June of 2012, but discontinued due to nausea.      5. metastatic disease to liver and bone pathologically documented October of 2012, estrogen receptor 100% positive, progesterone receptor 7% positive, with no HER-2 amplification;  treatment in the metastatic setting has consisted of:     6.Faslodex started 03/16/2011, discontinued November 2013 with progression. Zoladex every 3 months also started on 03/16/2011, and monthly zoledronic acid started November 2011, the Zometa I currently held due to pending dental procedures and possible extractions.               7. Status post thoracic  laminectomy, T9-T11, for impending cord compression on 03/21/2011, followed by irradiation to the spine completed November 2011   8. 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 s/p palliative radiation to the Right hip clivus to 30 Gy completed 04/07/2012   10.  Progression noted by scans in November 2013, at which time Faslodex was discontinued. Patient was started on exemestane in early December, with everolimus started in mid December at 5 mg daily.  Continues to receive Zoladex injections every 3 months, last given 09/05/2012.   11. Nondisplaced right femoral neck fracture, status post right hip arthroplasty on 05/04/2012 under the care of Dr[facility].   12. Hypercalcemia with elevated serum creatinine: resolved   13.  s/p palliative radiation therapy for cord compression completed 08/15/2012   14. Poorly controlled pain.  15. Malnutrition, protein calorie        PLAN:   Zalea's pain remains poorly controlled. We will slowly titrate up the fentanyl patch, meanwhile continue the breakthrough meds. As soon as frequency decreases we will d/c the IV dilaudid and try to control her pain w fentanyl + oxycodone/tylenol. I am ging to obtain films to evaluate the R leg pain with a view to possible Rad Onc intervention, and we will evaluate her liver lesions for progression. All this was discussed with the patient, who is in agreement. We are switching her to my service.  Lowella Dell, MD

## 2012-10-26 NOTE — Progress Notes (Signed)
INITIAL NUTRITION ASSESSMENT  Pt meets criteria for severe MALNUTRITION in the context of chronic illness as evidenced by 12% weight loss in the past month per pt in addition to pt with visible severe muscle wasting and subcutaneous fat loss in orbital, temporal, and upper arm region.  DOCUMENTATION CODES Per approved criteria  -Severe malnutrition in the context of chronic illness   INTERVENTION: - Ensure Complete TID - Encouraged increased PO intake - Will continue to monitor   NUTRITION DIAGNOSIS: Unintended weight loss related to poor appetite as evidenced by pt statement.   Goal: Pt to consume >90% of meals/supplements  Monitor:  Weights, labs, intake   Reason for Assessment: Nutrition risk   47 y.o. female  Admitting Dx: Intractable pain   ASSESSMENT: Pt with metastatic breast CA with progressive worsening pain over the past 2-3 months. Pt is s/p multiple rounds of chemotherapy. Met with pt who reports eating 1 meal/day at home with poor appetite for the past few days. Pt reports her daughter makes sure she eats at least once/day. Pt also drinks 2-3 Ensure/day at home. Pt reports weighing 120 pounds 2-3 weeks ago, now down to 106 pounds. Pt ate 50% of breakfast this morning. Noted pt with visible severe muscle wasting and subcutaneous fat loss in upper arms, temporal, and orbital region.   Height: Ht Readings from Last 1 Encounters:  10/25/12 5\' 2"  (1.575 m)    Weight: Wt Readings from Last 1 Encounters:  10/25/12 106 lb 7.7 oz (48.3 kg)    Ideal Body Weight: 110 lb   % Ideal Body Weight: 96  Wt Readings from Last 10 Encounters:  10/25/12 106 lb 7.7 oz (48.3 kg)  10/16/12 113 lb (51.256 kg)  10/12/12 117 lb 8 oz (53.298 kg)  10/04/12 113 lb (51.256 kg)  09/27/12 115 lb 8 oz (52.39 kg)  09/05/12 119 lb 6.4 oz (54.159 kg)  08/22/12 124 lb 12.8 oz (56.609 kg)  08/06/12 130 lb 11.7 oz (59.3 kg)  08/09/12 129 lb 4.8 oz (58.65 kg)  08/03/12 133 lb 1.6 oz (60.374  kg)    Usual Body Weight: 120 lb per pt  % Usual Body Weight: 88  BMI:  Body mass index is 19.47 kg/(m^2).  Estimated Nutritional Needs: Kcal: 1450-1650 Protein: 60-75g Fluid: 1.4-1.6L/day  Skin: Intact   Diet Order: General  EDUCATION NEEDS: -No education needs identified at this time   Intake/Output Summary (Last 24 hours) at 10/26/12 1319 Last data filed at 10/26/12 0900  Gross per 24 hour  Intake    480 ml  Output      0 ml  Net    480 ml    Last BM: 5/27  Labs:   Recent Labs Lab 10/19/12 1400 10/25/12 1740 10/26/12 0530  NA 138 141 139  K 3.8 3.5 3.5  CL 101 103 100  CO2 27 27 28   BUN 6.0* 6 11  CREATININE 0.7 0.52 0.58  CALCIUM 9.6 9.2 9.1  GLUCOSE 119* 88 101*    CBG (last 3)  No results found for this basename: GLUCAP,  in the last 72 hours  Scheduled Meds: . dexamethasone  8 mg Oral BID WC  . docusate sodium  200 mg Oral BID  . everolimus  5 mg Oral Daily  . exemestane  25 mg Oral QPC breakfast  . feeding supplement  237 mL Oral TID BM  . feeding supplement  1 Container Oral TID BM  . fentaNYL  100 mcg Transdermal Q72H  .  ferrous sulfate  325 mg Oral TID PC  . fluconazole  100 mg Oral Daily  . heparin  5,000 Units Subcutaneous Q8H  . pantoprazole  40 mg Oral Daily    Continuous Infusions:   Past Medical History  Diagnosis Date  . Hypertension   . GERD (gastroesophageal reflux disease)   . 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 Pyelonephritis 10/01/11  . Recurrent UTI (urinary tract infection) 10/01/11  . S/P chemotherapy, time since 4-12 weeks   . S/P radiation > 12 weeks 03/15/12- 04/07/12    right hip, clivus  . 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 Brain cancer     spread from breast ca  . Hypercalcemia 06/21/2012  . History of radiation therapy 08/06/10 -09/16/10    R supraclav fossa, R chest wall, R drain site  . radiaiton therapy 07/22/12 -08/15/12    T4-T7    Past Surgical History   Procedure Laterality Date  . Cesarean section  1989; 2010  . Mastectomy  2011    right  . Back surgery  2012    "removed tumor,cancer, from my spine  . Port-a-cath removal  2012    right chest  . Portacath placement  2011    right  . Tubal ligation  2010  . Breast biopsy  2011    right  . Hip arthroplasty  05/04/2012    Procedure: ARTHROPLASTY BIPOLAR HIP;  Surgeon: Shelda Pal, MD;  Location: [facility];  Service: Orthopedics;  Laterality: Right;  RIGHT HIP HEMI ARTHROPLASTY      Levon Hedger MS, RD, LDN [phone] Pager [phone] After Hours Pager

## 2012-10-26 NOTE — Progress Notes (Signed)
   CARE MANAGEMENT NOTE 10/26/2012  Patient:  [name], [name]   Account Number:  [id number]  Date Initiated:  10/26/2012  Documentation initiated by:  Ezekiel Ina  Subjective/Objective Assessment:   pt admitted with Intractable Pain, having problems with co pay for medications     Action/Plan:   from home   Anticipated DC Date:  10/28/2012   Anticipated DC Plan:  HOME/SELF CARE      DC Planning Services  CM consult      Choice offered to / List presented to:             Status of service:  In process, will continue to follow Medicare Important Message given?   (If response is "NO", the following Medicare IM given date fields will be blank) Date Medicare IM given:   Date Additional Medicare IM given:    Discharge Disposition:    Per UR Regulation:  Reviewed for med. necessity/level of care/duration of stay  If discussed at Long Length of Stay Meetings, dates discussed:    Comments:  10/26/12 MMcGibboney, RN, BSN Chart reviewed. Pt has Medicaid with a 0-$3.00 co pay, we are not able to assist pt with co pays.

## 2012-10-27 ENCOUNTER — Ambulatory Visit
Admission: RE | Admit: 2012-10-27 | Discharge: 2012-10-27 | Disposition: A | Discharge status: Home / Self Care | Payer: Medicaid Other | Source: Ambulatory Visit | Attending: Radiation Oncology | Admitting: Radiation Oncology

## 2012-10-27 ENCOUNTER — Telehealth: Payer: Self-pay | Admitting: *Deleted

## 2012-10-27 ENCOUNTER — Encounter: Payer: Self-pay | Admitting: Radiation Oncology

## 2012-10-27 ENCOUNTER — Ambulatory Visit: Payer: Medicaid Other | Admitting: Physician Assistant

## 2012-10-27 DIAGNOSIS — C787 Secondary malignant neoplasm of liver and intrahepatic bile duct: Secondary | ICD-10-CM

## 2012-10-27 DIAGNOSIS — C50919 Malignant neoplasm of unspecified site of unspecified female breast: Secondary | ICD-10-CM | POA: Insufficient documentation

## 2012-10-27 DIAGNOSIS — C7951 Secondary malignant neoplasm of bone: Secondary | ICD-10-CM | POA: Insufficient documentation

## 2012-10-27 DIAGNOSIS — Z510 Encounter for antineoplastic radiation therapy: Secondary | ICD-10-CM | POA: Insufficient documentation

## 2012-10-27 LAB — URINE CULTURE
Colony Count: NO GROWTH
Culture: NO GROWTH

## 2012-10-27 MED ORDER — PROCHLORPERAZINE EDISYLATE 5 MG/ML IJ SOLN
10.0000 mg | Freq: Four times a day (QID) | INTRAMUSCULAR | Status: DC | PRN
  Administered 2012-10-27 – 2012-10-28 (×3): 10 mg via INTRAVENOUS
  Filled 2012-10-27 (×3): qty 2

## 2012-10-27 MED ORDER — BISACODYL 10 MG RE SUPP
10.0000 mg | Freq: Every day | RECTAL | Status: DC | PRN
  Administered 2012-10-27: 10 mg via RECTAL
  Filled 2012-10-27: qty 1

## 2012-10-27 MED ORDER — MILK AND MOLASSES ENEMA
Freq: Once | RECTAL | Status: AC
  Administered 2012-10-28: 01:00:00 via RECTAL
  Filled 2012-10-27: qty 250

## 2012-10-27 MED ORDER — FENTANYL 75 MCG/HR TD PT72
150.0000 ug | MEDICATED_PATCH | TRANSDERMAL | Status: DC
  Administered 2012-10-27: 150 ug via TRANSDERMAL
  Filled 2012-10-27: qty 2

## 2012-10-27 NOTE — Progress Notes (Signed)
[name]   DOB:[birth year]   ZO#:[id number]   JWJ#:[id number]  Subjective: [name] is sleepy, she says because she slept so poorly last night. Pain remains poorly controlled and she has been requesting breakthrough pain meds Q4h. No BM x >48 hrs. No nausea or vomiting. Problems with ambulation due to pain. No family in room   Objective: middle aged [place] woman examined in bed Filed Vitals:   10/27/12 0707  BP: 123/67  Pulse: 91  Temp: 98 F (36.7 C)  Resp: 16    Body mass index is 19.47 kg/(m^2).  Intake/Output Summary (Last 24 hours) at 10/27/12 1323 Last data filed at 10/27/12 0620  Gross per 24 hour  Intake 993.75 ml  Output      0 ml  Net 993.75 ml     Sclerae unicteric  Oropharynx clear  No peripheral adenopathy  Lungs clear -- no rales or rhonchi  Heart regular rate and rhythm  Abdomen distended, moderately tender to deep palpation, +BS, no masses palpated  MSK no focal spinal tenderness  Neuro nonfocal  Breast exam: deferred  CBG (last 3)  No results found for this basename: GLUCAP,  in the last 72 hours   Labs:  Lab Results  Component Value Date   WBC 3.7* 10/26/2012   HGB 8.4* 10/26/2012   HCT 26.0* 10/26/2012   MCV 84.4 10/26/2012   PLT 330 10/26/2012   NEUTROABS 2.6 10/26/2012    @LASTCHEMISTRY @  Urine Studies No results found for this basename: UACOL, UAPR, USPG, UPH, UTP, UGL, UKET, UBIL, UHGB, UNIT, UROB, ULEU, UEPI, UWBC, URBC, UBAC, CAST, CRYS, UCOM, BILUA,  in the last 72 hours  Basic Metabolic Panel:  Recent Labs Lab 10/25/12 1740 10/26/12 0530  NA 141 139  K 3.5 3.5  CL 103 100  CO2 27 28  GLUCOSE 88 101*  BUN 6 11  CREATININE 0.52 0.58  CALCIUM 9.2 9.1   GFR Estimated Creatinine Clearance: 66.3 ml/min (by C-G formula based on Cr of 0.58). Liver Function Tests:  Recent Labs Lab 10/25/12 1740 10/26/12 0530  AST 50* 55*  ALT 15 16  ALKPHOS 897* 915*  BILITOT 0.6 0.5  PROT 6.8 6.2  ALBUMIN 2.8* 2.5*   No  results found for this basename: LIPASE, AMYLASE,  in the last 168 hours No results found for this basename: AMMONIA,  in the last 168 hours Coagulation profile No results found for this basename: INR, PROTIME,  in the last 168 hours  CBC:  Recent Labs Lab 10/25/12 1740 10/26/12 0530  WBC 4.6 3.7*  NEUTROABS 3.5 2.6  HGB 9.1* 8.4*  HCT 27.5* 26.0*  MCV 83.1 84.4  PLT 393 330   Cardiac Enzymes: No results found for this basename: CKTOTAL, CKMB, CKMBINDEX, TROPONINI,  in the last 168 hours BNP: No components found with this basename: POCBNP,  CBG: No results found for this basename: GLUCAP,  in the last 168 hours D-Dimer No results found for this basename: DDIMER,  in the last 72 hours Hgb A1c No results found for this basename: HGBA1C,  in the last 72 hours Lipid Profile No results found for this basename: CHOL, HDL, LDLCALC, TRIG, CHOLHDL, LDLDIRECT,  in the last 72 hours Thyroid function studies No results found for this basename: TSH, T4TOTAL, FREET3, T3FREE, THYROIDAB,  in the last 72 hours Anemia work up No results found for this basename: VITAMINB12, FOLATE, FERRITIN, TIBC, IRON, RETICCTPCT,  in the last 72 hours Microbiology Recent Results (from the past 240  hour(s))  URINE CULTURE     Status: None   Collection Time    10/25/12  7:51 PM      Result Value Range Status   Specimen Description URINE, CLEAN CATCH   Final   Special Requests NONE   Final   Culture  Setup Time 10/26/2012 03:04   Final   Colony Count NO GROWTH   Final   Culture NO GROWTH   Final   Report Status 10/27/2012 FINAL   Final    Studies:  Mr Thoracic Spine W Wo Contrast  10/27/2012   *RADIOLOGY REPORT*  Clinical Data:  Metastatic breast cancer status post radiation therapy.  Severe low back pain with worsening left leg pain.  MRI THORACIC AND LUMBAR SPINE WITHOUT AND WITH CONTRAST  Technique:  Multiplanar and multiecho pulse sequences of the thoracic and lumbar spine were obtained without and  with intravenous contrast.  Contrast: 9mL MULTIHANCE GADOBENATE DIMEGLUMINE 529 MG/ML IV SOLN  Comparison:  CTs of the chest, abdomen and pelvis 08/04/2012. Thoracic MRI 07/21/2012.  Lumbar MRI 03/13/2011.  MRI THORACIC SPINE  Findings: Diffuse metastatic disease to the cervicothoracic spine and multiple ribs is again noted.  No pathologic fractures are identified.  There are stable postsurgical changes status post T9- T11 posterior decompression.  No significant epidural disease is identified within the thoracic spine.  There is disease at the thoracolumbar junction, further described on the lumbar spine report below.  The thoracic cord is normal in signal and caliber.  There is no abnormal intradural enhancement.  A right pleural effusion and multifocal hepatic metastatic disease are noted.  IMPRESSION:  1.  No significant change in multifocal osseous metastases. 2.  No pathologic fracture or recurrent epidural tumor is identified within the thoracic spine.  See lumbar spine findings below. 3.  Hepatic metastatic disease.  MRI LUMBAR SPINE  Findings: Multifocal metastases are demonstrated within the lumbar spine, upper sacrum and iliac bones bilaterally.  These have progressed from the 2012 MRI, but are similar to the more recent abdominal pelvic CT.  The spinal involvement is greatest at L1 and L2, although there is also significant involvement within the L2 vertebral body and its right pedicle, the right L5 pedicle, in the upper and mid sacrum. No pathologic fractures are identified.  Epidural tumor associated with the L1 vertebral body and its right pedicle is asymmetric to the right, extends into the right L1-L2 foramen and moderately compresses the thecal sac. The associated mass effect on the thecal sac has mildly worsened since the CT of 10 weeks ago.  There is significant epidural tumor bilaterally at L3 with resulting moderate compression of the thecal sac and right greater than left L3-L4 neural  foraminal extension.  This appears similar to the prior CT.  A small amount of epidural tumor is noted laterally in the right L5-S1 foramen related to tumor in the right L5 pedicle.  There is also some epidural tumor associated with an expansile metastasis within the third sacral segment.  No abnormal intradural enhancement is identified.  There are no paraspinal masses.  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  IMPRESSION:  1.  Slowly progressive widespread metastatic disease to the lumbar spine, sacrum and iliac bones as described.  No pathologic fractures identified. 2.  There is associated epidural tumor, especially on the right at L1-L2 and bilaterally at L3-L4. At both levels, there is deformity of the thecal sac and neural foraminal extension asymmetric to the right.  There is some epidural  tumor on the left at L3 which could account for the patient's symptoms.  The epidural disease appears only slightly progressive compared with the abdominal CT of 10 weeks prior.   Original Report Authenticated By: Carey Bullocks, M.D.   Mr Lumbar Spine W Wo Contrast  10/27/2012   *RADIOLOGY REPORT*  Clinical Data:  Metastatic breast cancer status post radiation therapy.  Severe low back pain with worsening left leg pain.  MRI THORACIC AND LUMBAR SPINE WITHOUT AND WITH CONTRAST  Technique:  Multiplanar and multiecho pulse sequences of the thoracic and lumbar spine were obtained without and with intravenous contrast.  Contrast: 9mL MULTIHANCE GADOBENATE DIMEGLUMINE 529 MG/ML IV SOLN  Comparison:  CTs of the chest, abdomen and pelvis 08/04/2012. Thoracic MRI 07/21/2012.  Lumbar MRI 03/13/2011.  MRI THORACIC SPINE  Findings: Diffuse metastatic disease to the cervicothoracic spine and multiple ribs is again noted.  No pathologic fractures are identified.  There are stable postsurgical changes status post T9- T11 posterior decompression.  No significant epidural disease is identified within the thoracic spine.  There is  disease at the thoracolumbar junction, further described on the lumbar spine report below.  The thoracic cord is normal in signal and caliber.  There is no abnormal intradural enhancement.  A right pleural effusion and multifocal hepatic metastatic disease are noted.  IMPRESSION:  1.  No significant change in multifocal osseous metastases. 2.  No pathologic fracture or recurrent epidural tumor is identified within the thoracic spine.  See lumbar spine findings below. 3.  Hepatic metastatic disease.  MRI LUMBAR SPINE  Findings: Multifocal metastases are demonstrated within the lumbar spine, upper sacrum and iliac bones bilaterally.  These have progressed from the 2012 MRI, but are similar to the more recent abdominal pelvic CT.  The spinal involvement is greatest at L1 and L2, although there is also significant involvement within the L2 vertebral body and its right pedicle, the right L5 pedicle, in the upper and mid sacrum. No pathologic fractures are identified.  Epidural tumor associated with the L1 vertebral body and its right pedicle is asymmetric to the right, extends into the right L1-L2 foramen and moderately compresses the thecal sac. The associated mass effect on the thecal sac has mildly worsened since the CT of 10 weeks ago.  There is significant epidural tumor bilaterally at L3 with resulting moderate compression of the thecal sac and right greater than left L3-L4 neural foraminal extension.  This appears similar to the prior CT.  A small amount of epidural tumor is noted laterally in the right L5-S1 foramen related to tumor in the right L5 pedicle.  There is also some epidural tumor associated with an expansile metastasis within the third sacral segment.  No abnormal intradural enhancement is identified.  There are no paraspinal masses.  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  IMPRESSION:  1.  Slowly progressive widespread metastatic disease to the lumbar spine, sacrum and iliac bones as described.   No pathologic fractures identified. 2.  There is associated epidural tumor, especially on the right at L1-L2 and bilaterally at L3-L4. At both levels, there is deformity of the thecal sac and neural foraminal extension asymmetric to the right.  There is some epidural tumor on the left at L3 which could account for the patient's symptoms.  The epidural disease appears only slightly progressive compared with the abdominal CT of 10 weeks prior.   Original Report Authenticated By: Carey Bullocks, M.D.    Assessment: 47 y.o. 47 y.o. [place] woman  1. 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  2.Status post adjuvant chemotherapy July to November 2011, consisting of 4 cycles of dose dense doxorubicin and cyclophosphamide, followed by 4 of 12 planned weekly doses of paclitaxel given adjuvantly, discontinued secondary to peripheral neuropathy.  3. Status post radiation to the right chest wall, truncated due to problems with the patient missing appointments.  4. Tamoxifen started June of 2012, but discontinued due to nausea.  5. metastatic disease to liver and bone pathologically documented October of 2012, estrogen receptor 100% positive, progesterone receptor 7% positive, with no HER-2 amplification; treatment in the metastatic setting has consisted of:  6.Faslodex started 03/16/2011, discontinued November 2013 with progression. Zoladex every 3 months also started on 03/16/2011, and monthly zoledronic acid started November 2011, the Zometa I currently held due to pending dental procedures and possible extractions.  7. Status post thoracic laminectomy, T9-T11, for impending cord compression on 03/21/2011, followed by irradiation to the spine completed November 2011  8. 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 s/p palliative radiation to the Right hip clivus to 30 Gy completed 04/07/2012  10. Progression noted by scans in November 2013, at which time Faslodex was discontinued. Patient was started on exemestane in early December, with everolimus started in mid December at 5 mg daily. Continues to receive Zoladex injections every 3 months, last given 09/05/2012.  11. Nondisplaced right femoral neck fracture, status post right hip arthroplasty on 05/04/2012 under the care of Dr[facility].  12. Hypercalcemia with elevated serum creatinine: resolved  13. s/p palliative radiation therapy to the thoracic spine for cord compression completed 08/15/2012  14. Poorly controlled pain. 15. constipation  Plan: I have discussed the MRI results with Dr Michell Heinrich and she is considering a brief course of palliative radiation to the lumbar area for pain control and to prevent cord impingement in the future. I am meanwhile increasing the fentanyl but stoping the IV dilaudid. I am hopeful on this combination the patient's pain may be sufficiently controlled that we can consider discharge this weekend.  Lowella Dell, MD 10/27/2012  1:23 PM

## 2012-10-27 NOTE — Telephone Encounter (Signed)
Called the floor room 1339,spoke with Cindy,RN, she just medicated patient for nausea (compazine 10mg )and pain, (2 OXY IR at 1355pm,  given, pain scale was a 10 on 1-10 scale, patient status to come down in a bed, has IVF's ongoing, called and spoke with Jehnna,therapist to notify transporter Lorin Picket to bring patient in bed",thanked [place] for status 2:42 PM

## 2012-10-27 NOTE — Progress Notes (Signed)
Pt had radiation today and was NPO for an MRI with contrast which was scheduled for 1800. When The transporter came to get her it was discovered she  Had eaten dinner. Pt thought she was NPO for radiation, So MRI is rescheduled for 5/30 and pt is to be NPO after Midnight. Pt had Afinator brought in and it was brought down to pharmacy to dispense.

## 2012-10-28 ENCOUNTER — Inpatient Hospital Stay (HOSPITAL_COMMUNITY): Payer: Medicaid Other

## 2012-10-28 ENCOUNTER — Encounter: Payer: Self-pay | Admitting: Radiation Oncology

## 2012-10-28 ENCOUNTER — Other Ambulatory Visit (HOSPITAL_COMMUNITY): Payer: Self-pay | Admitting: Oncology

## 2012-10-28 ENCOUNTER — Ambulatory Visit
Admit: 2012-10-28 | Discharge: 2012-10-28 | Disposition: A | Discharge status: Home / Self Care | Payer: Medicaid Other | Attending: Radiation Oncology | Admitting: Radiation Oncology

## 2012-10-28 ENCOUNTER — Encounter: Payer: Self-pay | Admitting: *Deleted

## 2012-10-28 ENCOUNTER — Telehealth: Payer: Self-pay

## 2012-10-28 DIAGNOSIS — C7951 Secondary malignant neoplasm of bone: Secondary | ICD-10-CM

## 2012-10-28 MED ORDER — PROCHLORPERAZINE MALEATE 10 MG PO TABS: 10.0000 mg | ORAL_TABLET | Freq: Four times a day (QID) | ORAL | Status: DC | PRN

## 2012-10-28 MED ORDER — BIAFINE EX EMUL
CUTANEOUS | Status: DC | PRN
  Administered 2012-10-28: 1 via TOPICAL

## 2012-10-28 MED ORDER — ONDANSETRON HCL 4 MG/2ML IJ SOLN: 4.0000 mg | Freq: Three times a day (TID) | INTRAMUSCULAR | Status: DC | PRN

## 2012-10-28 MED ORDER — GADOBENATE DIMEGLUMINE 529 MG/ML IV SOLN
9.0000 mL | Freq: Once | INTRAVENOUS | Status: AC | PRN
  Administered 2012-10-28: 9 mL via INTRAVENOUS

## 2012-10-28 MED ORDER — FENTANYL 75 MCG/HR TD PT72: 2.0000 | MEDICATED_PATCH | TRANSDERMAL | Status: DC

## 2012-10-28 MED ORDER — DEXAMETHASONE 4 MG PO TABS: 4.0000 mg | ORAL_TABLET | Freq: Every day | ORAL | Status: DC

## 2012-10-28 MED ORDER — BISACODYL 10 MG RE SUPP: 10.0000 mg | Freq: Every day | RECTAL | Status: DC | PRN

## 2012-10-28 MED ORDER — DSS 100 MG PO CAPS: 200.0000 mg | ORAL_CAPSULE | Freq: Two times a day (BID) | ORAL | Status: DC

## 2012-10-28 NOTE — ED Provider Notes (Signed)
History     CSN: [id number]  Arrival date & time 10/16/12  2155   First MD Initiated Contact with Patient 10/16/12 2225      Chief Complaint  Patient presents with  . Hip Pain    (Consider location/radiation/quality/duration/timing/severity/associated sxs/prior treatment) HPI  Past Medical History  Diagnosis Date  . Hypertension   . GERD (gastroesophageal reflux disease)   . 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 Pyelonephritis 10/01/11  . Recurrent UTI (urinary tract infection) 10/01/11  . S/P chemotherapy, time since 4-12 weeks   . S/P radiation > 12 weeks 03/15/12- 04/07/12    right hip, clivus  . 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 Brain cancer     spread from breast ca  . Hypercalcemia 06/21/2012  . History of radiation therapy 08/06/10 -09/16/10    R supraclav fossa, R chest wall, R drain site  . radiaiton therapy 07/22/12 -08/15/12    T4-T7    Past Surgical History  Procedure Laterality Date  . Cesarean section  1989; 2010  . Mastectomy  2011    right  . Back surgery  2012    "removed tumor,cancer, from my spine  . Port-a-cath removal  2012    right chest  . Portacath placement  2011    right  . Tubal ligation  2010  . Breast biopsy  2011    right  . Hip arthroplasty  05/04/2012    Procedure: ARTHROPLASTY BIPOLAR HIP;  Surgeon: Shelda Pal, MD;  Location: [facility];  Service: Orthopedics;  Laterality: Right;  RIGHT HIP HEMI ARTHROPLASTY     Family History  Problem Relation Age of Onset  . Cancer Brother 81    died of colon cancer at age of 82  . High blood pressure Mother     History  Substance Use Topics  . Smoking status: Former Smoker -- 0.25 packs/day for 31 years    Types: Cigarettes    Quit date: 04/26/2012  . Smokeless tobacco: Never Used  . Alcohol Use: No    OB History   Grav Para Term Preterm Abortions TAB SAB Ect Mult Living                  Review of Systems  Allergies  Review of  patient's allergies indicates no known allergies.  Home Medications  No current outpatient prescriptions on file.  BP 135/77  Pulse 99  Temp(Src) 98.6 F (37 C) (Oral)  Resp 18  Ht 5\' 2"  (1.575 m)  Wt 113 lb (51.256 kg)  BMI 20.66 kg/m2  SpO2 97%  Physical Exam  Constitutional: She appears well-developed.  HENT:  Head: Normocephalic.  Eyes: Pupils are equal, round, and reactive to light.  Neck: Normal range of motion.  Cardiovascular: Normal rate, regular rhythm and normal heart sounds.   Pulmonary/Chest: Effort normal.  Abdominal: Soft. Bowel sounds are normal.  Musculoskeletal: She exhibits tenderness.       Legs:   ED Course  Procedures (including critical care time)  Labs Reviewed  CBC WITH DIFFERENTIAL - Abnormal; Notable for the following:    RBC 3.28 (*)    Hemoglobin 8.5 (*)    HCT 27.5 (*)    MCH 25.9 (*)    RDW 19.9 (*)    All other components within normal limits  POCT I-STAT, CHEM 8 - Abnormal; Notable for the following:    BUN 5 (*)  Hemoglobin 9.5 (*)    HCT 28.0 (*)    All other components within normal limits   Mr Thoracic Spine W Wo Contrast  10/27/2012   *RADIOLOGY REPORT*  Clinical Data:  Metastatic breast cancer status post radiation therapy.  Severe low back pain with worsening left leg pain.  MRI THORACIC AND LUMBAR SPINE WITHOUT AND WITH CONTRAST  Technique:  Multiplanar and multiecho pulse sequences of the thoracic and lumbar spine were obtained without and with intravenous contrast.  Contrast: 9mL MULTIHANCE GADOBENATE DIMEGLUMINE 529 MG/ML IV SOLN  Comparison:  CTs of the chest, abdomen and pelvis 08/04/2012. Thoracic MRI 07/21/2012.  Lumbar MRI 03/13/2011.  MRI THORACIC SPINE  Findings: Diffuse metastatic disease to the cervicothoracic spine and multiple ribs is again noted.  No pathologic fractures are identified.  There are stable postsurgical changes status post T9- T11 posterior decompression.  No significant epidural disease is  identified within the thoracic spine.  There is disease at the thoracolumbar junction, further described on the lumbar spine report below.  The thoracic cord is normal in signal and caliber.  There is no abnormal intradural enhancement.  A right pleural effusion and multifocal hepatic metastatic disease are noted.  IMPRESSION:  1.  No significant change in multifocal osseous metastases. 2.  No pathologic fracture or recurrent epidural tumor is identified within the thoracic spine.  See lumbar spine findings below. 3.  Hepatic metastatic disease.  MRI LUMBAR SPINE  Findings: Multifocal metastases are demonstrated within the lumbar spine, upper sacrum and iliac bones bilaterally.  These have progressed from the 2012 MRI, but are similar to the more recent abdominal pelvic CT.  The spinal involvement is greatest at L1 and L2, although there is also significant involvement within the L2 vertebral body and its right pedicle, the right L5 pedicle, in the upper and mid sacrum. No pathologic fractures are identified.  Epidural tumor associated with the L1 vertebral body and its right pedicle is asymmetric to the right, extends into the right L1-L2 foramen and moderately compresses the thecal sac. The associated mass effect on the thecal sac has mildly worsened since the CT of 10 weeks ago.  There is significant epidural tumor bilaterally at L3 with resulting moderate compression of the thecal sac and right greater than left L3-L4 neural foraminal extension.  This appears similar to the prior CT.  A small amount of epidural tumor is noted laterally in the right L5-S1 foramen related to tumor in the right L5 pedicle.  There is also some epidural tumor associated with an expansile metastasis within the third sacral segment.  No abnormal intradural enhancement is identified.  There are no paraspinal masses.  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  IMPRESSION:  1.  Slowly progressive widespread metastatic disease to the  lumbar spine, sacrum and iliac bones as described.  No pathologic fractures identified. 2.  There is associated epidural tumor, especially on the right at L1-L2 and bilaterally at L3-L4. At both levels, there is deformity of the thecal sac and neural foraminal extension asymmetric to the right.  There is some epidural tumor on the left at L3 which could account for the patient's symptoms.  The epidural disease appears only slightly progressive compared with the abdominal CT of 10 weeks prior.   Original Report Authenticated By: Carey Bullocks, M.D.   Mr Lumbar Spine W Wo Contrast  10/27/2012   *RADIOLOGY REPORT*  Clinical Data:  Metastatic breast cancer status post radiation therapy.  Severe low back pain with  worsening left leg pain.  MRI THORACIC AND LUMBAR SPINE WITHOUT AND WITH CONTRAST  Technique:  Multiplanar and multiecho pulse sequences of the thoracic and lumbar spine were obtained without and with intravenous contrast.  Contrast: 9mL MULTIHANCE GADOBENATE DIMEGLUMINE 529 MG/ML IV SOLN  Comparison:  CTs of the chest, abdomen and pelvis 08/04/2012. Thoracic MRI 07/21/2012.  Lumbar MRI 03/13/2011.  MRI THORACIC SPINE  Findings: Diffuse metastatic disease to the cervicothoracic spine and multiple ribs is again noted.  No pathologic fractures are identified.  There are stable postsurgical changes status post T9- T11 posterior decompression.  No significant epidural disease is identified within the thoracic spine.  There is disease at the thoracolumbar junction, further described on the lumbar spine report below.  The thoracic cord is normal in signal and caliber.  There is no abnormal intradural enhancement.  A right pleural effusion and multifocal hepatic metastatic disease are noted.  IMPRESSION:  1.  No significant change in multifocal osseous metastases. 2.  No pathologic fracture or recurrent epidural tumor is identified within the thoracic spine.  See lumbar spine findings below. 3.  Hepatic metastatic  disease.  MRI LUMBAR SPINE  Findings: Multifocal metastases are demonstrated within the lumbar spine, upper sacrum and iliac bones bilaterally.  These have progressed from the 2012 MRI, but are similar to the more recent abdominal pelvic CT.  The spinal involvement is greatest at L1 and L2, although there is also significant involvement within the L2 vertebral body and its right pedicle, the right L5 pedicle, in the upper and mid sacrum. No pathologic fractures are identified.  Epidural tumor associated with the L1 vertebral body and its right pedicle is asymmetric to the right, extends into the right L1-L2 foramen and moderately compresses the thecal sac. The associated mass effect on the thecal sac has mildly worsened since the CT of 10 weeks ago.  There is significant epidural tumor bilaterally at L3 with resulting moderate compression of the thecal sac and right greater than left L3-L4 neural foraminal extension.  This appears similar to the prior CT.  A small amount of epidural tumor is noted laterally in the right L5-S1 foramen related to tumor in the right L5 pedicle.  There is also some epidural tumor associated with an expansile metastasis within the third sacral segment.  No abnormal intradural enhancement is identified.  There are no paraspinal masses.  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  IMPRESSION:  1.  Slowly progressive widespread metastatic disease to the lumbar spine, sacrum and iliac bones as described.  No pathologic fractures identified. 2.  There is associated epidural tumor, especially on the right at L1-L2 and bilaterally at L3-L4. At both levels, there is deformity of the thecal sac and neural foraminal extension asymmetric to the right.  There is some epidural tumor on the left at L3 which could account for the patient's symptoms.  The epidural disease appears only slightly progressive compared with the abdominal CT of 10 weeks prior.   Original Report Authenticated By: Carey Bullocks, M.D.     1. Metastatic cancer       MDM          Arman Filter, NP 10/28/12 [phone]

## 2012-10-28 NOTE — Discharge Summary (Signed)
Physician Discharge Summary  Patient ID: [name] MRN: [id number] DOB/AGE: 12/20/1965 47 y.o.  Admit date: 10/25/2012 Discharge date: 10/28/2012  Primary Care Physician:  Pcp Not In System   Discharge Diagnoses:  Uncontrolled pain Hypertension  .  GERD (gastroesophageal reflux disease)  .  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  Pyelonephritis  10/01/11  .  Recurrent UTI (urinary tract infection)  10/01/11  .  S/P chemotherapy, time since 4-12 weeks  .  S/P radiation > 12 weeks  03/15/12- 04/07/12  right hip, clivus  .  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  Hypercalcemia  06/21/2012  .  History of radiation therapy  08/06/10 -09/16/10  R supraclav fossa, R chest wall, R drain site  .  radiaiton therapy  07/22/12 -08/15/12  T4-T7   Discharge Medications:    Medication List    STOP taking these medications       Oxycodone HCl 10 MG Tabs     OxyCODONE HCl ER 60 MG T12a      TAKE these medications       bisacodyl 10 MG suppository  Commonly known as:  DULCOLAX  Place 1 suppository (10 mg total) rectally daily as needed.     cyclobenzaprine 10 MG tablet  Commonly known as:  FLEXERIL  Take 10 mg by mouth 3 (three) times daily as needed for muscle spasms.     dexamethasone 4 MG tablet  Commonly known as:  DECADRON  Take 1 tablet (4 mg total) by mouth daily with breakfast.     DSS 100 MG Caps  Take 200 mg by mouth 2 (two) times daily.     everolimus 5 MG tablet  Commonly known as:  AFINITOR  Take 5 mg by mouth daily.     exemestane 25 MG tablet  Commonly known as:  AROMASIN  Take 25 mg by mouth daily after breakfast.     fentaNYL 75 MCG/HR  Commonly known as:  DURAGESIC - dosed mcg/hr  Place 2 patches (150 mcg total) onto the skin every 3 (three) days.     ferrous sulfate 325 (65 FE) MG tablet  Take 325 mg by mouth 3 (three) times daily after meals.      fluconazole 100 MG tablet  Commonly known as:  DIFLUCAN  Take 100 mg by mouth daily.     oxyCODONE-acetaminophen 10-325 MG per tablet  Commonly known as:  PERCOCET  Take 1-2 tablets by mouth every 4 (four) hours as needed for pain.     pantoprazole 40 MG tablet  Commonly known as:  PROTONIX  Take 40 mg by mouth daily.     zolpidem 5 MG tablet  Commonly known as:  AMBIEN  Take 5 mg by mouth at bedtime as needed for sleep.         Disposition and Follow-up: to cancer center 6/2 at 11 AM for labs and 6/3 at 2:30 PM for visit  Significant Diagnostic Studies:  Dg Pelvis 1-2 Views  10/16/2012   *RADIOLOGY REPORT*  Clinical Data: Worsening left groin and hip pain.  PELVIS - 1-2 VIEW  Comparison: 09/27/2012  Findings: Right hip prosthesis remains in appropriate position.  No evidence of pelvic fracture or diastasis.  No other pelvic bone abnormality identified.  Small calcified fibroids again seen in the central pelvis.  IMPRESSION:  1.  No acute findings.  2.  Right hip prosthesis in appropriate position. 3.  Small calcified fibroids.   Original Report Authenticated By: Myles Rosenthal, M.D.   Mr Thoracic Spine W Wo Contrast  10/27/2012   *RADIOLOGY REPORT*  Clinical Data:  Metastatic breast cancer status post radiation therapy.  Severe low back pain with worsening left leg pain.  MRI THORACIC AND LUMBAR SPINE WITHOUT AND WITH CONTRAST  Technique:  Multiplanar and multiecho pulse sequences of the thoracic and lumbar spine were obtained without and with intravenous contrast.  Contrast: 9mL MULTIHANCE GADOBENATE DIMEGLUMINE 529 MG/ML IV SOLN  Comparison:  CTs of the chest, abdomen and pelvis 08/04/2012. Thoracic MRI 07/21/2012.  Lumbar MRI 03/13/2011.  MRI THORACIC SPINE  Findings: Diffuse metastatic disease to the cervicothoracic spine and multiple ribs is again noted.  No pathologic fractures are identified.  There are stable postsurgical changes status post T9- T11 posterior decompression.  No  significant epidural disease is identified within the thoracic spine.  There is disease at the thoracolumbar junction, further described on the lumbar spine report below.  The thoracic cord is normal in signal and caliber.  There is no abnormal intradural enhancement.  A right pleural effusion and multifocal hepatic metastatic disease are noted.  IMPRESSION:  1.  No significant change in multifocal osseous metastases. 2.  No pathologic fracture or recurrent epidural tumor is identified within the thoracic spine.  See lumbar spine findings below. 3.  Hepatic metastatic disease.  MRI LUMBAR SPINE  Findings: Multifocal metastases are demonstrated within the lumbar spine, upper sacrum and iliac bones bilaterally.  These have progressed from the 2012 MRI, but are similar to the more recent abdominal pelvic CT.  The spinal involvement is greatest at L1 and L2, although there is also significant involvement within the L2 vertebral body and its right pedicle, the right L5 pedicle, in the upper and mid sacrum. No pathologic fractures are identified.  Epidural tumor associated with the L1 vertebral body and its right pedicle is asymmetric to the right, extends into the right L1-L2 foramen and moderately compresses the thecal sac. The associated mass effect on the thecal sac has mildly worsened since the CT of 10 weeks ago.  There is significant epidural tumor bilaterally at L3 with resulting moderate compression of the thecal sac and right greater than left L3-L4 neural foraminal extension.  This appears similar to the prior CT.  A small amount of epidural tumor is noted laterally in the right L5-S1 foramen related to tumor in the right L5 pedicle.  There is also some epidural tumor associated with an expansile metastasis within the third sacral segment.  No abnormal intradural enhancement is identified.  There are no paraspinal masses.  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  IMPRESSION:  1.  Slowly progressive  widespread metastatic disease to the lumbar spine, sacrum and iliac bones as described.  No pathologic fractures identified. 2.  There is associated epidural tumor, especially on the right at L1-L2 and bilaterally at L3-L4. At both levels, there is deformity of the thecal sac and neural foraminal extension asymmetric to the right.  There is some epidural tumor on the left at L3 which could account for the patient's symptoms.  The epidural disease appears only slightly progressive compared with the abdominal CT of 10 weeks prior.   Original Report Authenticated By: Carey Bullocks, M.D.   Mr Lumbar Spine W Wo Contrast  10/27/2012   *RADIOLOGY REPORT*  Clinical Data:  Metastatic breast cancer status post radiation therapy.  Severe  low back pain with worsening left leg pain.  MRI THORACIC AND LUMBAR SPINE WITHOUT AND WITH CONTRAST  Technique:  Multiplanar and multiecho pulse sequences of the thoracic and lumbar spine were obtained without and with intravenous contrast.  Contrast: 9mL MULTIHANCE GADOBENATE DIMEGLUMINE 529 MG/ML IV SOLN  Comparison:  CTs of the chest, abdomen and pelvis 08/04/2012. Thoracic MRI 07/21/2012.  Lumbar MRI 03/13/2011.  MRI THORACIC SPINE  Findings: Diffuse metastatic disease to the cervicothoracic spine and multiple ribs is again noted.  No pathologic fractures are identified.  There are stable postsurgical changes status post T9- T11 posterior decompression.  No significant epidural disease is identified within the thoracic spine.  There is disease at the thoracolumbar junction, further described on the lumbar spine report below.  The thoracic cord is normal in signal and caliber.  There is no abnormal intradural enhancement.  A right pleural effusion and multifocal hepatic metastatic disease are noted.  IMPRESSION:  1.  No significant change in multifocal osseous metastases. 2.  No pathologic fracture or recurrent epidural tumor is identified within the thoracic spine.  See lumbar spine  findings below. 3.  Hepatic metastatic disease.  MRI LUMBAR SPINE  Findings: Multifocal metastases are demonstrated within the lumbar spine, upper sacrum and iliac bones bilaterally.  These have progressed from the 2012 MRI, but are similar to the more recent abdominal pelvic CT.  The spinal involvement is greatest at L1 and L2, although there is also significant involvement within the L2 vertebral body and its right pedicle, the right L5 pedicle, in the upper and mid sacrum. No pathologic fractures are identified.  Epidural tumor associated with the L1 vertebral body and its right pedicle is asymmetric to the right, extends into the right L1-L2 foramen and moderately compresses the thecal sac. The associated mass effect on the thecal sac has mildly worsened since the CT of 10 weeks ago.  There is significant epidural tumor bilaterally at L3 with resulting moderate compression of the thecal sac and right greater than left L3-L4 neural foraminal extension.  This appears similar to the prior CT.  A small amount of epidural tumor is noted laterally in the right L5-S1 foramen related to tumor in the right L5 pedicle.  There is also some epidural tumor associated with an expansile metastasis within the third sacral segment.  No abnormal intradural enhancement is identified.  There are no paraspinal masses.  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  IMPRESSION:  1.  Slowly progressive widespread metastatic disease to the lumbar spine, sacrum and iliac bones as described.  No pathologic fractures identified. 2.  There is associated epidural tumor, especially on the right at L1-L2 and bilaterally at L3-L4. At both levels, there is deformity of the thecal sac and neural foraminal extension asymmetric to the right.  There is some epidural tumor on the left at L3 which could account for the patient's symptoms.  The epidural disease appears only slightly progressive compared with the abdominal CT of 10 weeks prior.   Original  Report Authenticated By: Carey Bullocks, M.D.   Mr Liver W Wo Contrast  10/28/2012   *RADIOLOGY REPORT*  Clinical Data:  History breast cancer with widespread metastatic disease.  Follow-up evaluation of liver metastases.  MRI ABDOMEN WITHOUT AND WITH CONTRAST  Technique:  Multiplanar multisequence MR imaging of the abdomen was performed both before and after the administration of intravenous contrast.  Contrast: 9mL MULTIHANCE GADOBENATE DIMEGLUMINE 529 MG/ML IV SOLN  Comparison:  Multiple priors, most recently a  CT of the chest abdomen and pelvis 08/04/2012.  Findings:  Again noted are innumerable hepatic nodules and masses. These are heterogeneous in signal intensity on all pulse sequences, including high signal intensity areas on pre gadolinium T1-weighted images, compatible with areas of intratumoral hemorrhage and/or highly proteinaceous content.  Post-gadolinium images demonstrates predominately peripheral enhancement which is progressive over time and slowly increasing on more delayed sequences.  The largest single mass is located in the central aspect of the liver measuring up to 12.3 x 10.7 x 11.9 cm (image 33 of series 13), slightly larger than the prior examination from 08/04/2012.  Previously noted lesion in the inferior aspect of segment 4B of the liver (image 76 of series 13) has also increased, currently measuring 7.0 x 6.3 cm (previously 6.0 x 5.9 cm).  No definite new hepatic lesions are noted.  Parenchymal thinning is again noted in the lateral aspect of the left kidney, compatible with some mild chronic scarring.  The appearance of the gallbladder, pancreas, spleen, bilateral adrenal glands and right kidney is unremarkable.  Small bilateral pleural effusions (right greater than left) layering dependently. Diffusely heterogeneous appearance of the visualized axial and appendicular skeleton with multiple enhancing lesions, compatible with widespread metastatic disease to the bones.  This  includes a large expansile lesion in the posterior aspect of the right 12th rib with probable pathologic fracture (image 42 of series 1101). Additionally, at L1-L2 and L3-L4 there is a large amount of enhancing epidural soft tissue.  IMPRESSION: 1.  Findings, as above, compatible with progressive metastatic disease to the liver with enlarging hepatic nodules and masses, as above.  No definite new hepatic lesions are identified. 2.  Widespread metastatic disease to the bones redemonstrated. This includes a pathologic fracture of the posterior aspect of the right 12th rib, and epidural extension of tumor at the level of L1-L2 and L3-L4. 3.  Small bilateral pleural effusions (right greater than left), similar to the prior examination.   Original Report Authenticated By: Trudie Reed, M.D.    Discharge Laboratory Values: Basic Metabolic Panel:  Recent Labs Lab 10/25/12 1740 10/26/12 0530  NA 141 139  K 3.5 3.5  CL 103 100  CO2 27 28  GLUCOSE 88 101*  BUN 6 11  CREATININE 0.52 0.58  CALCIUM 9.2 9.1   GFR Estimated Creatinine Clearance: 66.3 ml/min (by C-G formula based on Cr of 0.58). Liver Function Tests:  Recent Labs Lab 10/25/12 1740 10/26/12 0530  AST 50* 55*  ALT 15 16  ALKPHOS 897* 915*  BILITOT 0.6 0.5  PROT 6.8 6.2  ALBUMIN 2.8* 2.5*   No results found for this basename: LIPASE, AMYLASE,  in the last 168 hours No results found for this basename: AMMONIA,  in the last 168 hours Coagulation profile No results found for this basename: INR, PROTIME,  in the last 168 hours  CBC:  Recent Labs Lab 10/25/12 1740 10/26/12 0530  WBC 4.6 3.7*  NEUTROABS 3.5 2.6  HGB 9.1* 8.4*  HCT 27.5* 26.0*  MCV 83.1 84.4  PLT 393 330   Cardiac Enzymes: No results found for this basename: CKTOTAL, CKMB, CKMBINDEX, TROPONINI,  in the last 168 hours BNP: No components found with this basename: POCBNP,  CBG: No results found for this basename: GLUCAP,  in the last 168  hours D-Dimer No results found for this basename: DDIMER,  in the last 72 hours Hgb A1c No results found for this basename: HGBA1C,  in the last 72 hours Lipid Profile No  results found for this basename: CHOL, HDL, LDLCALC, TRIG, CHOLHDL, LDLDIRECT,  in the last 72 hours Thyroid function studies No results found for this basename: TSH, T4TOTAL, FREET3, T3FREE, THYROIDAB,  in the last 72 hours Anemia work up No results found for this basename: VITAMINB12, FOLATE, FERRITIN, TIBC, IRON, RETICCTPCT,  in the last 72 hours Microbiology Recent Results (from the past 240 hour(s))  URINE CULTURE     Status: None   Collection Time    10/25/12  7:51 PM      Result Value Range Status   Specimen Description URINE, CLEAN CATCH   Final   Special Requests NONE   Final   Culture  Setup Time 10/26/2012 03:04   Final   Colony Count NO GROWTH   Final   Culture NO GROWTH   Final   Report Status 10/27/2012 FINAL   Final     Brief H and P: For complete details please refer to admission H and P, but in brief, Ms Bevans was admitted through the ED with uncontrolled pain. See Hospital Course below for details.  Physical Exam at Discharge: BP 122/75  Pulse 85  Temp(Src) 98.2 F (36.8 C) (Oral)  Resp 16  Ht 5\' 2"  (1.575 m)  Wt 106 lb 7.7 oz (48.3 kg)  BMI 19.47 kg/m2  SpO2 95% Gen: middle aged [place] woman examined in bed Cardiovascular: RRR, no murmur appreciated Respiratory: no rales or rhonchi, examined anterolateally Gastrointestinal: abd distended but not terse, not tender, +BS, no masses palpated Extremities:no peripheral edema Neuro: nonfocal    Hospital Course: The patient was admitted with uncontrolled pain through the hospitalist service and pain was initially controlled with IV dilaudid, oral percocet and transdermal fentanyl. The patient was transferred to my service and we evaluated her pain with MRIs of the thoracic and lumbar spines. Thoracic spine films were stable but  lumbar spine films as detailed above showed significant epidural tumor and Radiation Oncology was reconsulted. Dr Michell Heinrich feels the patient may get significant pain control from radiation to that area and a single-fraction treatment has been scheduled for later today. In addition we restaged the patient's measurable disease in the liver. This shows mild growth. The patient only recently added everolimus to her treatment, so this MRI will serve as the new baseline to assess response. -- The patient's IV dilaudid was discontinued and her fentanyl increased to 150 mcg/h TD Q 72h. On that dose, with oxycodone/ APAP for breakthrough pain, the patient's pain is moderately to well controlled and the patient will be able to continue that regimen as outpatient. Constipation was treated with a milk and molassess enema and the patient has been instructed on continuing bowel prophylaxis. PROBLEM LIST: 47 y.o. [place] woman  1. 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  2.Status post adjuvant chemotherapy July to November 2011, consisting of 4 cycles of dose dense doxorubicin and cyclophosphamide, followed by 4 of 12 planned weekly doses of paclitaxel given adjuvantly, discontinued secondary to peripheral neuropathy.  3. Status post radiation to the right chest wall, truncated due to problems with the patient missing appointments.  4. Tamoxifen started June of 2012, but discontinued due to nausea.  5. metastatic disease to liver and bone pathologically documented October of 2012, estrogen receptor 100% positive, progesterone receptor 7% positive, with no HER-2 amplification; treatment in the  metastatic setting has consisted of:  6.Faslodex started 03/16/2011, discontinued November 2013 with progression. Zoladex every 3 months also  started on 03/16/2011, and monthly zoledronic acid started November 2011, the Zometa I currently held due to pending dental procedures and possible extractions.  7. Status post thoracic laminectomy, T9-T11, for impending cord compression on 03/21/2011, followed by irradiation to the spine completed November 2011  8. 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 s/p palliative radiation to the Right hip clivus to 30 Gy completed 04/07/2012  10. Progression noted by scans in November 2013, at which time Faslodex was discontinued. Patient was started on exemestane in early December, with everolimus started in mid December at 5 mg daily but not taken regularly until April 2014. Continues to receive Zoladex injections every 3 months, last given 09/05/2012.  11. Nondisplaced right femoral neck fracture, status post right hip arthroplasty on 05/04/2012 under the care of Dr.Matt [facility].  12. Hypercalcemia with elevated serum creatinine: resolved  13. s/p palliative radiation therapy to the thoracic spine for cord compression completed 08/15/2012  14. Poorly controlled pain.  15. Constipation 16. Single-fraction radiation to lumbar spine planned for 10/28/2012   Diet:  regular  Activity:  As tolerated  Condition at Discharge:   improved  Signed: Dr. Ruthann Cancer [phone]  10/28/2012, 9:09 AM

## 2012-10-28 NOTE — Progress Notes (Signed)
Patient actively vomiting after radiation.  Vomiting persisted and patient felt she no longer was able to go home.  Philomena Doheny RN

## 2012-10-28 NOTE — ED Provider Notes (Deleted)
History     CSN: [id number]  Arrival date & time 10/25/12  1651   First MD Initiated Contact with Patient 10/25/12 1715      Chief Complaint  Patient presents with  . Pain    (Consider location/radiation/quality/duration/timing/severity/associated sxs/prior treatment) HPI  Past Medical History  Diagnosis Date  . Hypertension   . GERD (gastroesophageal reflux disease)   . 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 Pyelonephritis 10/01/11  . Recurrent UTI (urinary tract infection) 10/01/11  . S/P chemotherapy, time since 4-12 weeks   . S/P radiation > 12 weeks 03/15/12- 04/07/12    right hip, clivus  . 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 Brain cancer     spread from breast ca  . Hypercalcemia 06/21/2012  . History of radiation therapy 08/06/10 -09/16/10    R supraclav fossa, R chest wall, R drain site  . radiaiton therapy 07/22/12 -08/15/12    T4-T7    Past Surgical History  Procedure Laterality Date  . Cesarean section  1989; 2010  . Mastectomy  2011    right  . Back surgery  2012    "removed tumor,cancer, from my spine  . Port-a-cath removal  2012    right chest  . Portacath placement  2011    right  . Tubal ligation  2010  . Breast biopsy  2011    right  . Hip arthroplasty  05/04/2012    Procedure: ARTHROPLASTY BIPOLAR HIP;  Surgeon: Shelda Pal, MD;  Location: [facility];  Service: Orthopedics;  Laterality: Right;  RIGHT HIP HEMI ARTHROPLASTY     Family History  Problem Relation Age of Onset  . Cancer Brother 87    died of colon cancer at age of 64  . High blood pressure Mother     History  Substance Use Topics  . Smoking status: Former Smoker -- 0.25 packs/day for 31 years    Types: Cigarettes    Quit date: 04/26/2012  . Smokeless tobacco: Never Used  . Alcohol Use: No    OB History   Grav Para Term Preterm Abortions TAB SAB Ect Mult Living                  Review of Systems  Allergies  Review of patient's  allergies indicates no known allergies.  Home Medications  No current outpatient prescriptions on file.  BP 128/59  Pulse 93  Temp(Src) 98.1 F (36.7 C) (Oral)  Resp 16  Ht 5\' 2"  (1.575 m)  Wt 106 lb 7.7 oz (48.3 kg)  BMI 19.47 kg/m2  SpO2 99%  Physical Exam  ED Course  Procedures (including critical care time)  Labs Reviewed  CBC WITH DIFFERENTIAL - Abnormal; Notable for the following:    RBC 3.31 (*)    Hemoglobin 9.1 (*)    HCT 27.5 (*)    RDW 19.5 (*)    All other components within normal limits  COMPREHENSIVE METABOLIC PANEL - Abnormal; Notable for the following:    Albumin 2.8 (*)    AST 50 (*)    Alkaline Phosphatase 897 (*)    All other components within normal limits  URINALYSIS, ROUTINE W REFLEX MICROSCOPIC - Abnormal; Notable for the following:    APPearance CLOUDY (*)    Leukocytes, UA MODERATE (*)    All other components within normal limits  URINE MICROSCOPIC-ADD ON - Abnormal; Notable for the following:  Squamous Epithelial / LPF FEW (*)    Bacteria, UA FEW (*)    All other components within normal limits  COMPREHENSIVE METABOLIC PANEL - Abnormal; Notable for the following:    Glucose, Bld 101 (*)    Albumin 2.5 (*)    AST 55 (*)    Alkaline Phosphatase 915 (*)    All other components within normal limits  CBC WITH DIFFERENTIAL - Abnormal; Notable for the following:    WBC 3.7 (*)    RBC 3.08 (*)    Hemoglobin 8.4 (*)    HCT 26.0 (*)    RDW 19.5 (*)    All other components within normal limits  URINE CULTURE   Mr Thoracic Spine W Wo Contrast  10/27/2012   *RADIOLOGY REPORT*  Clinical Data:  Metastatic breast cancer status post radiation therapy.  Severe low back pain with worsening left leg pain.  MRI THORACIC AND LUMBAR SPINE WITHOUT AND WITH CONTRAST  Technique:  Multiplanar and multiecho pulse sequences of the thoracic and lumbar spine were obtained without and with intravenous contrast.  Contrast: 9mL MULTIHANCE GADOBENATE DIMEGLUMINE 529  MG/ML IV SOLN  Comparison:  CTs of the chest, abdomen and pelvis 08/04/2012. Thoracic MRI 07/21/2012.  Lumbar MRI 03/13/2011.  MRI THORACIC SPINE  Findings: Diffuse metastatic disease to the cervicothoracic spine and multiple ribs is again noted.  No pathologic fractures are identified.  There are stable postsurgical changes status post T9- T11 posterior decompression.  No significant epidural disease is identified within the thoracic spine.  There is disease at the thoracolumbar junction, further described on the lumbar spine report below.  The thoracic cord is normal in signal and caliber.  There is no abnormal intradural enhancement.  A right pleural effusion and multifocal hepatic metastatic disease are noted.  IMPRESSION:  1.  No significant change in multifocal osseous metastases. 2.  No pathologic fracture or recurrent epidural tumor is identified within the thoracic spine.  See lumbar spine findings below. 3.  Hepatic metastatic disease.  MRI LUMBAR SPINE  Findings: Multifocal metastases are demonstrated within the lumbar spine, upper sacrum and iliac bones bilaterally.  These have progressed from the 2012 MRI, but are similar to the more recent abdominal pelvic CT.  The spinal involvement is greatest at L1 and L2, although there is also significant involvement within the L2 vertebral body and its right pedicle, the right L5 pedicle, in the upper and mid sacrum. No pathologic fractures are identified.  Epidural tumor associated with the L1 vertebral body and its right pedicle is asymmetric to the right, extends into the right L1-L2 foramen and moderately compresses the thecal sac. The associated mass effect on the thecal sac has mildly worsened since the CT of 10 weeks ago.  There is significant epidural tumor bilaterally at L3 with resulting moderate compression of the thecal sac and right greater than left L3-L4 neural foraminal extension.  This appears similar to the prior CT.  A small amount of epidural  tumor is noted laterally in the right L5-S1 foramen related to tumor in the right L5 pedicle.  There is also some epidural tumor associated with an expansile metastasis within the third sacral segment.  No abnormal intradural enhancement is identified.  There are no paraspinal masses.  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  IMPRESSION:  1.  Slowly progressive widespread metastatic disease to the lumbar spine, sacrum and iliac bones as described.  No pathologic fractures identified. 2.  There is associated epidural tumor, especially on  the right at L1-L2 and bilaterally at L3-L4. At both levels, there is deformity of the thecal sac and neural foraminal extension asymmetric to the right.  There is some epidural tumor on the left at L3 which could account for the patient's symptoms.  The epidural disease appears only slightly progressive compared with the abdominal CT of 10 weeks prior.   Original Report Authenticated By: Carey Bullocks, M.D.   Mr Lumbar Spine W Wo Contrast  10/27/2012   *RADIOLOGY REPORT*  Clinical Data:  Metastatic breast cancer status post radiation therapy.  Severe low back pain with worsening left leg pain.  MRI THORACIC AND LUMBAR SPINE WITHOUT AND WITH CONTRAST  Technique:  Multiplanar and multiecho pulse sequences of the thoracic and lumbar spine were obtained without and with intravenous contrast.  Contrast: 9mL MULTIHANCE GADOBENATE DIMEGLUMINE 529 MG/ML IV SOLN  Comparison:  CTs of the chest, abdomen and pelvis 08/04/2012. Thoracic MRI 07/21/2012.  Lumbar MRI 03/13/2011.  MRI THORACIC SPINE  Findings: Diffuse metastatic disease to the cervicothoracic spine and multiple ribs is again noted.  No pathologic fractures are identified.  There are stable postsurgical changes status post T9- T11 posterior decompression.  No significant epidural disease is identified within the thoracic spine.  There is disease at the thoracolumbar junction, further described on the lumbar spine report below.   The thoracic cord is normal in signal and caliber.  There is no abnormal intradural enhancement.  A right pleural effusion and multifocal hepatic metastatic disease are noted.  IMPRESSION:  1.  No significant change in multifocal osseous metastases. 2.  No pathologic fracture or recurrent epidural tumor is identified within the thoracic spine.  See lumbar spine findings below. 3.  Hepatic metastatic disease.  MRI LUMBAR SPINE  Findings: Multifocal metastases are demonstrated within the lumbar spine, upper sacrum and iliac bones bilaterally.  These have progressed from the 2012 MRI, but are similar to the more recent abdominal pelvic CT.  The spinal involvement is greatest at L1 and L2, although there is also significant involvement within the L2 vertebral body and its right pedicle, the right L5 pedicle, in the upper and mid sacrum. No pathologic fractures are identified.  Epidural tumor associated with the L1 vertebral body and its right pedicle is asymmetric to the right, extends into the right L1-L2 foramen and moderately compresses the thecal sac. The associated mass effect on the thecal sac has mildly worsened since the CT of 10 weeks ago.  There is significant epidural tumor bilaterally at L3 with resulting moderate compression of the thecal sac and right greater than left L3-L4 neural foraminal extension.  This appears similar to the prior CT.  A small amount of epidural tumor is noted laterally in the right L5-S1 foramen related to tumor in the right L5 pedicle.  There is also some epidural tumor associated with an expansile metastasis within the third sacral segment.  No abnormal intradural enhancement is identified.  There are no paraspinal masses.  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  IMPRESSION:  1.  Slowly progressive widespread metastatic disease to the lumbar spine, sacrum and iliac bones as described.  No pathologic fractures identified. 2.  There is associated epidural tumor, especially on  the right at L1-L2 and bilaterally at L3-L4. At both levels, there is deformity of the thecal sac and neural foraminal extension asymmetric to the right.  There is some epidural tumor on the left at L3 which could account for the patient's symptoms.  The epidural disease appears  only slightly progressive compared with the abdominal CT of 10 weeks prior.   Original Report Authenticated By: Carey Bullocks, M.D.     1. Metastatic cancer   2. Uncontrolled pain       MDM  admit        Arman Filter, NP 10/28/12 [phone]

## 2012-10-28 NOTE — Telephone Encounter (Signed)
Spoke with david in-patient nurse on [facility].Patient was for discharge home but had significant nausea after radiation treatment and is being delayed on being discharged.David aware radiation may be causing nausea.asked david to inform patient to take compazine 1 hour prior to radiation treatment to alleviate/prevent symptoms.

## 2012-10-28 NOTE — Progress Notes (Signed)
Per Dr. Michell Heinrich ,patient to have Biafine cream applied to her mid back down to sacrum twice daily for 1-2 weeks to prevent desquamation from radiatio treatment today, gave tube of cream to RN Onalee Hua, taking care of ms. Newill in 1339, discussed need for cream

## 2012-10-28 NOTE — Progress Notes (Signed)
Patient reported having nausea/vomiting and asked to stay overnight.  Zofran/Compazine prn while in the hospital.

## 2012-10-28 NOTE — Progress Notes (Signed)
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 We have previously treated basically the entirety of her spine. Her thoracic spine radiation ended at T12 which was completed November 2012. She has progressive liver disease but has refused hospice placement. The desire was for her to go home tomorrow and to receive her radiation treatments as an inpatient as she has had difficulties with compliance in the past. For that reason as an inpatient [name] was brought to the [facility] [employer].  Identity was confirmed.  All relevant records and images related to the planned course of therapy were reviewed.  Then, the patient was positioned in a stable reproducible clinical set-up for radiation therapy.  CT images were obtained.  An isocenter was placed. Skin markings were placed.  The CT images were loaded into the planning software where the target and avoidance structures were contoured.  The radiation prescription was entered and confirmed. The patient was discharged in stable condition and tolerated simulation well.    TREATMENT PLANNING NOTE:  Treatment planning then occurred. I have requested : MLC's, isodose plan, basic dose calculation  I have requested 3 dimensional simulation with DVH of cord, kidneys, bowel and GTV  I personally supervised the creation of 2 complex treatment devices. Special treatment procedure was also performed a she has received previous radiation to the spine and immediately adjacent to this area. I have considered a gray in 1 fraction again due to her poor prognosis as well as her transportation  difficulties.

## 2012-10-28 NOTE — ED Provider Notes (Signed)
History     CSN: [id number]  Arrival date & time 10/25/12  1651   First MD Initiated Contact with Patient 10/25/12 1715      Chief Complaint  Patient presents with  . Pain    (Consider location/radiation/quality/duration/timing/severity/associated sxs/prior treatment) The history is provided by the patient.    Past Medical History  Diagnosis Date  . Hypertension   . GERD (gastroesophageal reflux disease)   . 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 Pyelonephritis 10/01/11  . Recurrent UTI (urinary tract infection) 10/01/11  . S/P chemotherapy, time since 4-12 weeks   . S/P radiation > 12 weeks 03/15/12- 04/07/12    right hip, clivus  . 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 Brain cancer     spread from breast ca  . Hypercalcemia 06/21/2012  . History of radiation therapy 08/06/10 -09/16/10    R supraclav fossa, R chest wall, R drain site  . radiaiton therapy 07/22/12 -08/15/12    T4-T7    Past Surgical History  Procedure Laterality Date  . Cesarean section  1989; 2010  . Mastectomy  2011    right  . Back surgery  2012    "removed tumor,cancer, from my spine  . Port-a-cath removal  2012    right chest  . Portacath placement  2011    right  . Tubal ligation  2010  . Breast biopsy  2011    right  . Hip arthroplasty  05/04/2012    Procedure: ARTHROPLASTY BIPOLAR HIP;  Surgeon: Shelda Pal, MD;  Location: [facility];  Service: Orthopedics;  Laterality: Right;  RIGHT HIP HEMI ARTHROPLASTY     Family History  Problem Relation Age of Onset  . Cancer Brother 80    died of colon cancer at age of 12  . High blood pressure Mother     History  Substance Use Topics  . Smoking status: Former Smoker -- 0.25 packs/day for 31 years    Types: Cigarettes    Quit date: 04/26/2012  . Smokeless tobacco: Never Used  . Alcohol Use: No    OB History   Grav Para Term Preterm Abortions TAB SAB Ect Mult Living                  Review of  Systems  Constitutional: Positive for activity change and appetite change. Negative for chills and fatigue.  Respiratory: Negative for cough and shortness of breath.   Cardiovascular: Negative for chest pain.  Musculoskeletal: Positive for back pain and arthralgias. Negative for joint swelling.  Skin: Negative for rash and wound.  Neurological: Negative for dizziness.  All other systems reviewed and are negative.    Allergies  Review of patient's allergies indicates no known allergies.  Home Medications  No current outpatient prescriptions on file.  BP 128/59  Pulse 93  Temp(Src) 98.1 F (36.7 C) (Oral)  Resp 16  Ht 5\' 2"  (1.575 m)  Wt 106 lb 7.7 oz (48.3 kg)  BMI 19.47 kg/m2  SpO2 99%  Physical Exam  Nursing note and vitals reviewed. Constitutional: She is oriented to person, place, and time. She appears well-developed. She appears distressed.  HENT:  Head: Normocephalic and atraumatic.  Eyes: Pupils are equal, round, and reactive to light.  Neck: Normal range of motion.  Cardiovascular: Normal rate and regular rhythm.   Pulmonary/Chest: Effort normal and breath sounds normal.  Abdominal: Soft. She exhibits no distension. There  is no tenderness.  Musculoskeletal: Normal range of motion.  Neurological: She is alert and oriented to person, place, and time.  Skin: Skin is warm. There is pallor.    ED Course  Procedures (including critical care time)  Labs Reviewed  CBC WITH DIFFERENTIAL - Abnormal; Notable for the following:    RBC 3.31 (*)    Hemoglobin 9.1 (*)    HCT 27.5 (*)    RDW 19.5 (*)    All other components within normal limits  COMPREHENSIVE METABOLIC PANEL - Abnormal; Notable for the following:    Albumin 2.8 (*)    AST 50 (*)    Alkaline Phosphatase 897 (*)    All other components within normal limits  URINALYSIS, ROUTINE W REFLEX MICROSCOPIC - Abnormal; Notable for the following:    APPearance CLOUDY (*)    Leukocytes, UA MODERATE (*)    All  other components within normal limits  URINE MICROSCOPIC-ADD ON - Abnormal; Notable for the following:    Squamous Epithelial / LPF FEW (*)    Bacteria, UA FEW (*)    All other components within normal limits  COMPREHENSIVE METABOLIC PANEL - Abnormal; Notable for the following:    Glucose, Bld 101 (*)    Albumin 2.5 (*)    AST 55 (*)    Alkaline Phosphatase 915 (*)    All other components within normal limits  CBC WITH DIFFERENTIAL - Abnormal; Notable for the following:    WBC 3.7 (*)    RBC 3.08 (*)    Hemoglobin 8.4 (*)    HCT 26.0 (*)    RDW 19.5 (*)    All other components within normal limits  URINE CULTURE   Mr Thoracic Spine W Wo Contrast  10/27/2012   *RADIOLOGY REPORT*  Clinical Data:  Metastatic breast cancer status post radiation therapy.  Severe low back pain with worsening left leg pain.  MRI THORACIC AND LUMBAR SPINE WITHOUT AND WITH CONTRAST  Technique:  Multiplanar and multiecho pulse sequences of the thoracic and lumbar spine were obtained without and with intravenous contrast.  Contrast: 9mL MULTIHANCE GADOBENATE DIMEGLUMINE 529 MG/ML IV SOLN  Comparison:  CTs of the chest, abdomen and pelvis 08/04/2012. Thoracic MRI 07/21/2012.  Lumbar MRI 03/13/2011.  MRI THORACIC SPINE  Findings: Diffuse metastatic disease to the cervicothoracic spine and multiple ribs is again noted.  No pathologic fractures are identified.  There are stable postsurgical changes status post T9- T11 posterior decompression.  No significant epidural disease is identified within the thoracic spine.  There is disease at the thoracolumbar junction, further described on the lumbar spine report below.  The thoracic cord is normal in signal and caliber.  There is no abnormal intradural enhancement.  A right pleural effusion and multifocal hepatic metastatic disease are noted.  IMPRESSION:  1.  No significant change in multifocal osseous metastases. 2.  No pathologic fracture or recurrent epidural tumor is  identified within the thoracic spine.  See lumbar spine findings below. 3.  Hepatic metastatic disease.  MRI LUMBAR SPINE  Findings: Multifocal metastases are demonstrated within the lumbar spine, upper sacrum and iliac bones bilaterally.  These have progressed from the 2012 MRI, but are similar to the more recent abdominal pelvic CT.  The spinal involvement is greatest at L1 and L2, although there is also significant involvement within the L2 vertebral body and its right pedicle, the right L5 pedicle, in the upper and mid sacrum. No pathologic fractures are identified.  Epidural tumor associated with  the L1 vertebral body and its right pedicle is asymmetric to the right, extends into the right L1-L2 foramen and moderately compresses the thecal sac. The associated mass effect on the thecal sac has mildly worsened since the CT of 10 weeks ago.  There is significant epidural tumor bilaterally at L3 with resulting moderate compression of the thecal sac and right greater than left L3-L4 neural foraminal extension.  This appears similar to the prior CT.  A small amount of epidural tumor is noted laterally in the right L5-S1 foramen related to tumor in the right L5 pedicle.  There is also some epidural tumor associated with an expansile metastasis within the third sacral segment.  No abnormal intradural enhancement is identified.  There are no paraspinal masses.  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  IMPRESSION:  1.  Slowly progressive widespread metastatic disease to the lumbar spine, sacrum and iliac bones as described.  No pathologic fractures identified. 2.  There is associated epidural tumor, especially on the right at L1-L2 and bilaterally at L3-L4. At both levels, there is deformity of the thecal sac and neural foraminal extension asymmetric to the right.  There is some epidural tumor on the left at L3 which could account for the patient's symptoms.  The epidural disease appears only slightly progressive  compared with the abdominal CT of 10 weeks prior.   Original Report Authenticated By: Carey Bullocks, M.D.   Mr Lumbar Spine W Wo Contrast  10/27/2012   *RADIOLOGY REPORT*  Clinical Data:  Metastatic breast cancer status post radiation therapy.  Severe low back pain with worsening left leg pain.  MRI THORACIC AND LUMBAR SPINE WITHOUT AND WITH CONTRAST  Technique:  Multiplanar and multiecho pulse sequences of the thoracic and lumbar spine were obtained without and with intravenous contrast.  Contrast: 9mL MULTIHANCE GADOBENATE DIMEGLUMINE 529 MG/ML IV SOLN  Comparison:  CTs of the chest, abdomen and pelvis 08/04/2012. Thoracic MRI 07/21/2012.  Lumbar MRI 03/13/2011.  MRI THORACIC SPINE  Findings: Diffuse metastatic disease to the cervicothoracic spine and multiple ribs is again noted.  No pathologic fractures are identified.  There are stable postsurgical changes status post T9- T11 posterior decompression.  No significant epidural disease is identified within the thoracic spine.  There is disease at the thoracolumbar junction, further described on the lumbar spine report below.  The thoracic cord is normal in signal and caliber.  There is no abnormal intradural enhancement.  A right pleural effusion and multifocal hepatic metastatic disease are noted.  IMPRESSION:  1.  No significant change in multifocal osseous metastases. 2.  No pathologic fracture or recurrent epidural tumor is identified within the thoracic spine.  See lumbar spine findings below. 3.  Hepatic metastatic disease.  MRI LUMBAR SPINE  Findings: Multifocal metastases are demonstrated within the lumbar spine, upper sacrum and iliac bones bilaterally.  These have progressed from the 2012 MRI, but are similar to the more recent abdominal pelvic CT.  The spinal involvement is greatest at L1 and L2, although there is also significant involvement within the L2 vertebral body and its right pedicle, the right L5 pedicle, in the upper and mid sacrum. No  pathologic fractures are identified.  Epidural tumor associated with the L1 vertebral body and its right pedicle is asymmetric to the right, extends into the right L1-L2 foramen and moderately compresses the thecal sac. The associated mass effect on the thecal sac has mildly worsened since the CT of 10 weeks ago.  There is significant epidural tumor  bilaterally at L3 with resulting moderate compression of the thecal sac and right greater than left L3-L4 neural foraminal extension.  This appears similar to the prior CT.  A small amount of epidural tumor is noted laterally in the right L5-S1 foramen related to tumor in the right L5 pedicle.  There is also some epidural tumor associated with an expansile metastasis within the third sacral segment.  No abnormal intradural enhancement is identified.  There are no paraspinal masses.  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  IMPRESSION:  1.  Slowly progressive widespread metastatic disease to the lumbar spine, sacrum and iliac bones as described.  No pathologic fractures identified. 2.  There is associated epidural tumor, especially on the right at L1-L2 and bilaterally at L3-L4. At both levels, there is deformity of the thecal sac and neural foraminal extension asymmetric to the right.  There is some epidural tumor on the left at L3 which could account for the patient's symptoms.  The epidural disease appears only slightly progressive compared with the abdominal CT of 10 weeks prior.   Original Report Authenticated By: Carey Bullocks, M.D.     1. Metastatic cancer   2. Uncontrolled pain       MDM   PAtient admitted for pain control and hospice care         Arman Filter, NP 10/28/12 [phone]

## 2012-10-29 DIAGNOSIS — R112 Nausea with vomiting, unspecified: Secondary | ICD-10-CM

## 2012-10-29 MED ORDER — PROCHLORPERAZINE MALEATE 10 MG PO TABS: 10.0000 mg | ORAL_TABLET | Freq: Four times a day (QID) | ORAL | Status: DC | PRN

## 2012-10-29 MED ORDER — OXYCODONE-ACETAMINOPHEN 10-325 MG PO TABS: ORAL_TABLET | ORAL | Status: DC

## 2012-10-29 NOTE — Discharge Summary (Signed)
Physician Discharge Summary  Patient ID: [name] MRN: [id number] DOB/AGE: Feb 28, 1966 47 y.o.  Admit date: 10/25/2012 Discharge date: 10/29/2012  Primary Care Physician:  Pcp Not In System   Discharge Diagnoses:  Uncontrolled pain Hypertension  .  GERD (gastroesophageal reflux disease)  .  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  Pyelonephritis  10/01/11  .  Recurrent UTI (urinary tract infection)  10/01/11  .  S/P chemotherapy, time since 4-12 weeks  .  S/P radiation > 12 weeks  03/15/12- 04/07/12  right hip, clivus  .  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  Hypercalcemia  06/21/2012  .  History of radiation therapy  08/06/10 -09/16/10  R supraclav fossa, R chest wall, R drain site  .  radiaiton therapy  07/22/12 -08/15/12  T4-T7   Discharge Medications:    Medication List    STOP taking these medications       Oxycodone HCl 10 MG Tabs     OxyCODONE HCl ER 60 MG T12a      TAKE these medications       bisacodyl 10 MG suppository  Commonly known as:  DULCOLAX  Place 1 suppository (10 mg total) rectally daily as needed.     cyclobenzaprine 10 MG tablet  Commonly known as:  FLEXERIL  Take 10 mg by mouth 3 (three) times daily as needed for muscle spasms.     dexamethasone 4 MG tablet  Commonly known as:  DECADRON  Take 1 tablet (4 mg total) by mouth daily with breakfast.     DSS 100 MG Caps  Take 200 mg by mouth 2 (two) times daily.     emollient cream  Commonly known as:  BIAFINE  Apply 1 application topically 2 (two) times daily. Apply  Cream Biafine  To mid back down to sacrum twice daily starting today after radiation treatment, for 1-2 weeks to prevent desqaumation     everolimus 5 MG tablet  Commonly known as:  AFINITOR  Take 5 mg by mouth daily.     exemestane 25 MG tablet  Commonly known as:  AROMASIN  Take 25 mg by mouth daily after breakfast.      fentaNYL 75 MCG/HR  Commonly known as:  DURAGESIC - dosed mcg/hr  Place 2 patches (150 mcg total) onto the skin every 3 (three) days.     ferrous sulfate 325 (65 FE) MG tablet  Take 325 mg by mouth 3 (three) times daily after meals.     fluconazole 100 MG tablet  Commonly known as:  DIFLUCAN  Take 100 mg by mouth daily.     oxyCODONE-acetaminophen 10-325 MG per tablet  Commonly known as:  PERCOCET  Take 1 or 2 tablets every 6 hours as needed for pain     oxyCODONE-acetaminophen 10-325 MG per tablet  Commonly known as:  PERCOCET  Take 1-2 tablets by mouth every 4 (four) hours as needed for pain.     pantoprazole 40 MG tablet  Commonly known as:  PROTONIX  Take 40 mg by mouth daily.     prochlorperazine 10 MG tablet  Commonly known as:  COMPAZINE  Take 1 tablet (10 mg total) by mouth every 6 (six) hours as needed (nausea or vomiting).     zolpidem 5 MG tablet  Commonly known as:  AMBIEN  Take 5 mg by mouth at bedtime  as needed for sleep.         Disposition and Follow-up: to cancer center 6/2 at 11 AM for labs and 6/3 at 2:30 PM for visit  Significant Diagnostic Studies:  Dg Pelvis 1-2 Views  10/16/2012   *RADIOLOGY REPORT*  Clinical Data: Worsening left groin and hip pain.  PELVIS - 1-2 VIEW  Comparison: 09/27/2012  Findings: Right hip prosthesis remains in appropriate position.  No evidence of pelvic fracture or diastasis.  No other pelvic bone abnormality identified.  Small calcified fibroids again seen in the central pelvis.  IMPRESSION:  1.  No acute findings. 2.  Right hip prosthesis in appropriate position. 3.  Small calcified fibroids.   Original Report Authenticated By: Myles Rosenthal, M.D.   Mr Thoracic Spine W Wo Contrast  10/27/2012   *RADIOLOGY REPORT*  Clinical Data:  Metastatic breast cancer status post radiation therapy.  Severe low back pain with worsening left leg pain.  MRI THORACIC AND LUMBAR SPINE WITHOUT AND WITH CONTRAST  Technique:  Multiplanar and  multiecho pulse sequences of the thoracic and lumbar spine were obtained without and with intravenous contrast.  Contrast: 9mL MULTIHANCE GADOBENATE DIMEGLUMINE 529 MG/ML IV SOLN  Comparison:  CTs of the chest, abdomen and pelvis 08/04/2012. Thoracic MRI 07/21/2012.  Lumbar MRI 03/13/2011.  MRI THORACIC SPINE  Findings: Diffuse metastatic disease to the cervicothoracic spine and multiple ribs is again noted.  No pathologic fractures are identified.  There are stable postsurgical changes status post T9- T11 posterior decompression.  No significant epidural disease is identified within the thoracic spine.  There is disease at the thoracolumbar junction, further described on the lumbar spine report below.  The thoracic cord is normal in signal and caliber.  There is no abnormal intradural enhancement.  A right pleural effusion and multifocal hepatic metastatic disease are noted.  IMPRESSION:  1.  No significant change in multifocal osseous metastases. 2.  No pathologic fracture or recurrent epidural tumor is identified within the thoracic spine.  See lumbar spine findings below. 3.  Hepatic metastatic disease.  MRI LUMBAR SPINE  Findings: Multifocal metastases are demonstrated within the lumbar spine, upper sacrum and iliac bones bilaterally.  These have progressed from the 2012 MRI, but are similar to the more recent abdominal pelvic CT.  The spinal involvement is greatest at L1 and L2, although there is also significant involvement within the L2 vertebral body and its right pedicle, the right L5 pedicle, in the upper and mid sacrum. No pathologic fractures are identified.  Epidural tumor associated with the L1 vertebral body and its right pedicle is asymmetric to the right, extends into the right L1-L2 foramen and moderately compresses the thecal sac. The associated mass effect on the thecal sac has mildly worsened since the CT of 10 weeks ago.  There is significant epidural tumor bilaterally at L3 with resulting  moderate compression of the thecal sac and right greater than left L3-L4 neural foraminal extension.  This appears similar to the prior CT.  A small amount of epidural tumor is noted laterally in the right L5-S1 foramen related to tumor in the right L5 pedicle.  There is also some epidural tumor associated with an expansile metastasis within the third sacral segment.  No abnormal intradural enhancement is identified.  There are no paraspinal masses.  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  IMPRESSION:  1.  Slowly progressive widespread metastatic disease to the lumbar spine, sacrum and iliac bones as described.  No pathologic fractures identified. 2.  There is associated epidural tumor, especially on the right at L1-L2 and bilaterally at L3-L4. At both levels, there is deformity of the thecal sac and neural foraminal extension asymmetric to the right.  There is some epidural tumor on the left at L3 which could account for the patient's symptoms.  The epidural disease appears only slightly progressive compared with the abdominal CT of 10 weeks prior.   Original Report Authenticated By: Carey Bullocks, M.D.   Mr Lumbar Spine W Wo Contrast  10/27/2012   *RADIOLOGY REPORT*  Clinical Data:  Metastatic breast cancer status post radiation therapy.  Severe low back pain with worsening left leg pain.  MRI THORACIC AND LUMBAR SPINE WITHOUT AND WITH CONTRAST  Technique:  Multiplanar and multiecho pulse sequences of the thoracic and lumbar spine were obtained without and with intravenous contrast.  Contrast: 9mL MULTIHANCE GADOBENATE DIMEGLUMINE 529 MG/ML IV SOLN  Comparison:  CTs of the chest, abdomen and pelvis 08/04/2012. Thoracic MRI 07/21/2012.  Lumbar MRI 03/13/2011.  MRI THORACIC SPINE  Findings: Diffuse metastatic disease to the cervicothoracic spine and multiple ribs is again noted.  No pathologic fractures are identified.  There are stable postsurgical changes status post T9- T11 posterior decompression.  No  significant epidural disease is identified within the thoracic spine.  There is disease at the thoracolumbar junction, further described on the lumbar spine report below.  The thoracic cord is normal in signal and caliber.  There is no abnormal intradural enhancement.  A right pleural effusion and multifocal hepatic metastatic disease are noted.  IMPRESSION:  1.  No significant change in multifocal osseous metastases. 2.  No pathologic fracture or recurrent epidural tumor is identified within the thoracic spine.  See lumbar spine findings below. 3.  Hepatic metastatic disease.  MRI LUMBAR SPINE  Findings: Multifocal metastases are demonstrated within the lumbar spine, upper sacrum and iliac bones bilaterally.  These have progressed from the 2012 MRI, but are similar to the more recent abdominal pelvic CT.  The spinal involvement is greatest at L1 and L2, although there is also significant involvement within the L2 vertebral body and its right pedicle, the right L5 pedicle, in the upper and mid sacrum. No pathologic fractures are identified.  Epidural tumor associated with the L1 vertebral body and its right pedicle is asymmetric to the right, extends into the right L1-L2 foramen and moderately compresses the thecal sac. The associated mass effect on the thecal sac has mildly worsened since the CT of 10 weeks ago.  There is significant epidural tumor bilaterally at L3 with resulting moderate compression of the thecal sac and right greater than left L3-L4 neural foraminal extension.  This appears similar to the prior CT.  A small amount of epidural tumor is noted laterally in the right L5-S1 foramen related to tumor in the right L5 pedicle.  There is also some epidural tumor associated with an expansile metastasis within the third sacral segment.  No abnormal intradural enhancement is identified.  There are no paraspinal masses.  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  IMPRESSION:  1.  Slowly progressive  widespread metastatic disease to the lumbar spine, sacrum and iliac bones as described.  No pathologic fractures identified. 2.  There is associated epidural tumor, especially on the right at L1-L2 and bilaterally at L3-L4. At both levels, there is deformity of the thecal sac and neural foraminal extension asymmetric to the right.  There is some epidural tumor on the left at L3 which could account for the  patient's symptoms.  The epidural disease appears only slightly progressive compared with the abdominal CT of 10 weeks prior.   Original Report Authenticated By: Carey Bullocks, M.D.   Mr Liver W Wo Contrast  10/28/2012   *RADIOLOGY REPORT*  Clinical Data:  History breast cancer with widespread metastatic disease.  Follow-up evaluation of liver metastases.  MRI ABDOMEN WITHOUT AND WITH CONTRAST  Technique:  Multiplanar multisequence MR imaging of the abdomen was performed both before and after the administration of intravenous contrast.  Contrast: 9mL MULTIHANCE GADOBENATE DIMEGLUMINE 529 MG/ML IV SOLN  Comparison:  Multiple priors, most recently a CT of the chest abdomen and pelvis 08/04/2012.  Findings:  Again noted are innumerable hepatic nodules and masses. These are heterogeneous in signal intensity on all pulse sequences, including high signal intensity areas on pre gadolinium T1-weighted images, compatible with areas of intratumoral hemorrhage and/or highly proteinaceous content.  Post-gadolinium images demonstrates predominately peripheral enhancement which is progressive over time and slowly increasing on more delayed sequences.  The largest single mass is located in the central aspect of the liver measuring up to 12.3 x 10.7 x 11.9 cm (image 33 of series 13), slightly larger than the prior examination from 08/04/2012.  Previously noted lesion in the inferior aspect of segment 4B of the liver (image 76 of series 13) has also increased, currently measuring 7.0 x 6.3 cm (previously 6.0 x 5.9 cm).  No  definite new hepatic lesions are noted.  Parenchymal thinning is again noted in the lateral aspect of the left kidney, compatible with some mild chronic scarring.  The appearance of the gallbladder, pancreas, spleen, bilateral adrenal glands and right kidney is unremarkable.  Small bilateral pleural effusions (right greater than left) layering dependently. Diffusely heterogeneous appearance of the visualized axial and appendicular skeleton with multiple enhancing lesions, compatible with widespread metastatic disease to the bones.  This includes a large expansile lesion in the posterior aspect of the right 12th rib with probable pathologic fracture (image 42 of series 1101). Additionally, at L1-L2 and L3-L4 there is a large amount of enhancing epidural soft tissue.  IMPRESSION: 1.  Findings, as above, compatible with progressive metastatic disease to the liver with enlarging hepatic nodules and masses, as above.  No definite new hepatic lesions are identified. 2.  Widespread metastatic disease to the bones redemonstrated. This includes a pathologic fracture of the posterior aspect of the right 12th rib, and epidural extension of tumor at the level of L1-L2 and L3-L4. 3.  Small bilateral pleural effusions (right greater than left), similar to the prior examination.   Original Report Authenticated By: Trudie Reed, M.D.    Discharge Laboratory Values: Basic Metabolic Panel:  Recent Labs Lab 10/25/12 1740 10/26/12 0530  NA 141 139  K 3.5 3.5  CL 103 100  CO2 27 28  GLUCOSE 88 101*  BUN 6 11  CREATININE 0.52 0.58  CALCIUM 9.2 9.1   GFR Estimated Creatinine Clearance: 66.3 ml/min (by C-G formula based on Cr of 0.58). Liver Function Tests:  Recent Labs Lab 10/25/12 1740 10/26/12 0530  AST 50* 55*  ALT 15 16  ALKPHOS 897* 915*  BILITOT 0.6 0.5  PROT 6.8 6.2  ALBUMIN 2.8* 2.5*   No results found for this basename: LIPASE, AMYLASE,  in the last 168 hours No results found for this  basename: AMMONIA,  in the last 168 hours Coagulation profile No results found for this basename: INR, PROTIME,  in the last 168 hours  CBC:  Recent Labs  Lab 10/25/12 1740 10/26/12 0530  WBC 4.6 3.7*  NEUTROABS 3.5 2.6  HGB 9.1* 8.4*  HCT 27.5* 26.0*  MCV 83.1 84.4  PLT 393 330   Cardiac Enzymes: No results found for this basename: CKTOTAL, CKMB, CKMBINDEX, TROPONINI,  in the last 168 hours BNP: No components found with this basename: POCBNP,  CBG: No results found for this basename: GLUCAP,  in the last 168 hours D-Dimer No results found for this basename: DDIMER,  in the last 72 hours Hgb A1c No results found for this basename: HGBA1C,  in the last 72 hours Lipid Profile No results found for this basename: CHOL, HDL, LDLCALC, TRIG, CHOLHDL, LDLDIRECT,  in the last 72 hours Thyroid function studies No results found for this basename: TSH, T4TOTAL, FREET3, T3FREE, THYROIDAB,  in the last 72 hours Anemia work up No results found for this basename: VITAMINB12, FOLATE, FERRITIN, TIBC, IRON, RETICCTPCT,  in the last 72 hours Microbiology Recent Results (from the past 240 hour(s))  URINE CULTURE     Status: None   Collection Time    10/25/12  7:51 PM      Result Value Range Status   Specimen Description URINE, CLEAN CATCH   Final   Special Requests NONE   Final   Culture  Setup Time 10/26/2012 03:04   Final   Colony Count NO GROWTH   Final   Culture NO GROWTH   Final   Report Status 10/27/2012 FINAL   Final     Brief H and P: For complete details please refer to admission H and P, but in brief, Ms Zhan was admitted through the ED with uncontrolled pain. See Hospital Course below for details.  Physical Exam at Discharge: BP 124/65  Pulse 93  Temp(Src) 98.2 F (36.8 C) (Oral)  Resp 16  Ht 5\' 2"  (1.575 m)  Wt 106 lb 7.7 oz (48.3 kg)  BMI 19.47 kg/m2  SpO2 97% Gen: middle aged [place] American woman examined sitting up . in bed Cardiovascular: RRR, no murmur  appreciated Respiratory: no rales or rhonchi, examined anterolateally Gastrointestinal: abd distended, slightly terse, not tender, +BS, no masses palpated Extremities: no peripheral edema Neuro: nonfocal  Hospital Course: The patient was admitted with uncontrolled pain through the hospitalist service and pain was initially controlled with IV dilaudid, oral percocet and transdermal fentanyl. The patient was transferred to my service and we evaluated her pain with MRIs of the thoracic and lumbar spines. Thoracic spine films were stable but lumbar spine films as detailed above showed significant epidural tumor and Radiation Oncology was reconsulted. Dr Michell Heinrich feels the patient may get significant pain control from radiation to that area and a single-fraction treatment has been scheduled for later today. In addition we restaged the patient's measurable disease in the liver. This shows mild growth. The patient only recently added everolimus to her treatment, so this MRI will serve as the new baseline to assess response. -- The patient's IV dilaudid was discontinued and her fentanyl increased to 150 mcg/h TD Q 72h. On that dose, with oxycodone/ APAP for breakthrough pain, the patient's pain is moderately to well controlled and the patient will be able to continue that regimen as outpatient. Constipation was treated with a milk and molassess enema and the patient has been instructed on continuing bowel prophylaxis. PROBLEM LIST: 47 y.o. [place] woman  1. Status post right mastectomy and axillary lymph node dissection November 2010 for a T3 N1(mic) Stage IIIA invasive ductal carcinoma, grade 3, estrogen and  progesterone receptor positive, HER-2/neu negative, with an MIB-1 of 36%.  2.Status post adjuvant chemotherapy July to November 2011, consisting of 4 cycles of dose dense doxorubicin and cyclophosphamide, followed by 4 of 12 planned weekly doses of paclitaxel given adjuvantly, discontinued secondary to  peripheral neuropathy.  3. Status post radiation to the right chest wall, truncated due to problems with the patient missing appointments.  4. Tamoxifen started June of 2012, but discontinued due to nausea.  5. metastatic disease to liver and bone pathologically documented October of 2012, estrogen receptor 100% positive, progesterone receptor 7% positive, with no HER-2 amplification; treatment in the metastatic setting has consisted of:  6.Faslodex started 03/16/2011, discontinued November 2013 with progression. Zoladex every 3 months also started on 03/16/2011, and monthly zoledronic acid started November 2011, the Zometa I currently held due to pending dental procedures and possible extractions.  7. Status post thoracic laminectomy, T9-T11, for impending cord compression on 03/21/2011, followed by irradiation to the spine completed November 2011  8. 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 s/p palliative radiation to the Right hip clivus to 30 Gy completed 04/07/2012  10. Progression noted by scans in November 2013, at which time Faslodex was discontinued. Patient was started on exemestane in early December, with everolimus started in mid December at 5 mg daily but not taken regularly until April 2014. Continues to receive Zoladex injections every 3 months, last given 09/05/2012.  11. Nondisplaced right femoral neck fracture, status post right hip arthroplasty on 05/04/2012 under the care of Dr.[facility].  12. Hypercalcemia with elevated serum creatinine: resolved  13. s/p palliative radiation therapy to the thoracic spine for cord compression completed 08/15/2012  14. Poorly controlled pain.  15. Constipation 16. Single-fraction radiation to lumbar spine planned for 10/28/2012  ADDENDUM: patient was discharged yesterday pending her treatment under radiation oncology. The patient  underwent simulation and treatment to her lumbar spine. Afterwards she developed nausea and "spitting up." It was felt she should be observed overnight and her discharged was postponed. -- This AM the patient is "fine," no nausea or vomiting overnight. Exam is stable. We are proceeding to discharge. Prescriptions for percocet and compazine added.   Diet:  regular  Activity:  As tolerated  Condition at Discharge:   improved  Signed: Dr. Ruthann Cancer [phone]  10/29/2012, 7:54 AM

## 2012-10-29 NOTE — ED Provider Notes (Signed)
Medical screening examination/treatment/procedure(s) were performed by non-physician practitioner and as supervising physician I was immediately available for consultation/collaboration.   Karon Heckendorn, MD 10/29/12 0710 

## 2012-10-29 NOTE — ED Provider Notes (Signed)
Medical screening examination/treatment/procedure(s) were performed by non-physician practitioner and as supervising physician I was immediately available for consultation/collaboration.   Gwyneth Sprout, MD 10/29/12 [phone]

## 2012-10-29 NOTE — Progress Notes (Signed)
Discharge instructions given, left via wheelchair to family member waiting at [employer], [place]

## 2012-10-30 NOTE — ED Provider Notes (Signed)
Medical screening examination/treatment/procedure(s) were performed by non-physician practitioner and as supervising physician I was immediately available for consultation/collaboration.   Gwyneth Sprout, MD 10/30/12 [phone]

## 2012-10-31 ENCOUNTER — Other Ambulatory Visit (HOSPITAL_BASED_OUTPATIENT_CLINIC_OR_DEPARTMENT_OTHER): Payer: Medicaid Other | Admitting: Lab

## 2012-10-31 DIAGNOSIS — C787 Secondary malignant neoplasm of liver and intrahepatic bile duct: Secondary | ICD-10-CM

## 2012-10-31 DIAGNOSIS — C801 Malignant (primary) neoplasm, unspecified: Secondary | ICD-10-CM

## 2012-10-31 DIAGNOSIS — C50919 Malignant neoplasm of unspecified site of unspecified female breast: Secondary | ICD-10-CM

## 2012-10-31 DIAGNOSIS — C7951 Secondary malignant neoplasm of bone: Secondary | ICD-10-CM

## 2012-10-31 DIAGNOSIS — D649 Anemia, unspecified: Secondary | ICD-10-CM

## 2012-10-31 LAB — COMPREHENSIVE METABOLIC PANEL (CC13)
ALT: 57 U/L — ABNORMAL HIGH (ref 0–55)
Albumin: 2.8 g/dL — ABNORMAL LOW (ref 3.5–5.0)
CO2: 29 mEq/L (ref 22–29)
Calcium: 9.4 mg/dL (ref 8.4–10.4)
Chloride: 98 mEq/L (ref 98–107)
Creatinine: 0.7 mg/dL (ref 0.6–1.1)

## 2012-10-31 LAB — CBC WITH DIFFERENTIAL/PLATELET
BASO%: 0.7 % (ref 0.0–2.0)
Basophils Absolute: 0.1 10*3/uL (ref 0.0–0.1)
HCT: 27.9 % — ABNORMAL LOW (ref 34.8–46.6)
HGB: 9.2 g/dL — ABNORMAL LOW (ref 11.6–15.9)
MONO#: 0.5 10*3/uL (ref 0.1–0.9)
NEUT%: 86.2 % — ABNORMAL HIGH (ref 38.4–76.8)
WBC: 8.4 10*3/uL (ref 3.9–10.3)
lymph#: 0.5 10*3/uL — ABNORMAL LOW (ref 0.9–3.3)

## 2012-11-01 LAB — URINE DRUGS OF ABUSE SCREEN W ALC, ROUTINE (REF LAB)
Amphetamine Screen, Ur: NEGATIVE
Benzodiazepines.: NEGATIVE
Creatinine,U: 182.2 mg/dL
Methadone: NEGATIVE
Propoxyphene: NEGATIVE

## 2012-11-02 NOTE — Progress Notes (Signed)
°  Radiation Oncology         [phone] ________________________________  Name: [name] MRN: [id number]  Date: 10/28/2012  DOB: 1966-02-25  End of Treatment Note  Diagnosis:   Metastatic Breast Cancer to Spine     Indication for treatment:  Palliative       Radiation treatment dates:  10/28/2012  Site/dose:   [name] was treated as an inpatient with 8 gray in a single fraction to the lumbar and sacral spine. This completed the treatment of the majority of her axial skeleton. She was treated for palliation of pain. Due to her poor prognosis, she was treated with a single PA field utilizing 15 MV photons. She was given Biafine and instructions on skin care in the treated area upon discharge.  Narrative: The patient tolerated radiation treatment relatively well.   She was eating a doughnut when she was transferred back to the floor.  Plan: The patient has completed radiation treatment. The patient will return to radiation oncology clinic for routine followup in one month. I advised them to call or return sooner if they have any questions or concerns related to their recovery or treatment.  ------------------------------------------------  [name], MD

## 2012-11-02 NOTE — Progress Notes (Signed)
°  Radiation Oncology         [phone] ________________________________  Name: [name] MRN: [id number]  Date: 10/28/2012  DOB: 1965-12-26  Simulation Verification Note  Status: Inpatient  NARRATIVE: The patient was brought to the treatment unit and placed in the planned treatment position. The clinical setup was verified. Then port films were obtained and uploaded to the radiation oncology medical record software.  The treatment beams were carefully compared against the planned radiation fields. The position location and shape of the radiation fields was reviewed. The targeted volume of tissue appears appropriately covered by the radiation beams. Organs at risk appear to be excluded as planned.  Based on my personal review, I approved the simulation verification. The patient's treatment will proceed as planned.  ------------------------------------------------  [name], MD

## 2012-11-04 ENCOUNTER — Other Ambulatory Visit: Payer: Self-pay | Admitting: *Deleted

## 2012-11-04 MED ORDER — FENTANYL 75 MCG/HR TD PT72: 2.0000 | MEDICATED_PATCH | TRANSDERMAL | Status: DC

## 2012-11-04 MED ORDER — OXYCODONE-ACETAMINOPHEN 10-325 MG PO TABS: 1.0000 | ORAL_TABLET | Freq: Four times a day (QID) | ORAL | Status: DC | PRN

## 2012-11-05 ENCOUNTER — Encounter (HOSPITAL_COMMUNITY): Payer: Self-pay | Admitting: Emergency Medicine

## 2012-11-05 ENCOUNTER — Emergency Department (HOSPITAL_COMMUNITY)
Admission: EM | Admit: 2012-11-05 | Discharge: 2012-11-06 | Disposition: A | Discharge status: Home / Self Care | Payer: Medicaid Other | Attending: Emergency Medicine | Admitting: Emergency Medicine

## 2012-11-05 ENCOUNTER — Other Ambulatory Visit: Payer: Self-pay

## 2012-11-05 DIAGNOSIS — Z87448 Personal history of other diseases of urinary system: Secondary | ICD-10-CM | POA: Insufficient documentation

## 2012-11-05 DIAGNOSIS — Z862 Personal history of diseases of the blood and blood-forming organs and certain disorders involving the immune mechanism: Secondary | ICD-10-CM | POA: Insufficient documentation

## 2012-11-05 DIAGNOSIS — Z87891 Personal history of nicotine dependence: Secondary | ICD-10-CM | POA: Insufficient documentation

## 2012-11-05 DIAGNOSIS — Z87440 Personal history of urinary (tract) infections: Secondary | ICD-10-CM | POA: Insufficient documentation

## 2012-11-05 DIAGNOSIS — Z8639 Personal history of other endocrine, nutritional and metabolic disease: Secondary | ICD-10-CM | POA: Insufficient documentation

## 2012-11-05 DIAGNOSIS — Z923 Personal history of irradiation: Secondary | ICD-10-CM | POA: Insufficient documentation

## 2012-11-05 DIAGNOSIS — Z853 Personal history of malignant neoplasm of breast: Secondary | ICD-10-CM | POA: Insufficient documentation

## 2012-11-05 DIAGNOSIS — Z85830 Personal history of malignant neoplasm of bone: Secondary | ICD-10-CM | POA: Insufficient documentation

## 2012-11-05 DIAGNOSIS — C799 Secondary malignant neoplasm of unspecified site: Secondary | ICD-10-CM

## 2012-11-05 DIAGNOSIS — I10 Essential (primary) hypertension: Secondary | ICD-10-CM | POA: Insufficient documentation

## 2012-11-05 DIAGNOSIS — C50919 Malignant neoplasm of unspecified site of unspecified female breast: Secondary | ICD-10-CM | POA: Insufficient documentation

## 2012-11-05 DIAGNOSIS — Z79899 Other long term (current) drug therapy: Secondary | ICD-10-CM | POA: Insufficient documentation

## 2012-11-05 DIAGNOSIS — R52 Pain, unspecified: Secondary | ICD-10-CM | POA: Insufficient documentation

## 2012-11-05 DIAGNOSIS — Z5189 Encounter for other specified aftercare: Secondary | ICD-10-CM | POA: Insufficient documentation

## 2012-11-05 DIAGNOSIS — Z8719 Personal history of other diseases of the digestive system: Secondary | ICD-10-CM | POA: Insufficient documentation

## 2012-11-05 MED ORDER — FENTANYL CITRATE 0.05 MG/ML IJ SOLN: 100.0000 ug | Freq: Once | INTRAMUSCULAR | Status: DC

## 2012-11-05 MED ORDER — FENTANYL CITRATE 0.05 MG/ML IJ SOLN
100.0000 ug | Freq: Once | INTRAMUSCULAR | Status: AC
  Administered 2012-11-05: 100 ug via INTRAMUSCULAR
  Filled 2012-11-05: qty 2

## 2012-11-05 MED ORDER — FENTANYL 75 MCG/HR TD PT72
75.0000 ug | MEDICATED_PATCH | TRANSDERMAL | Status: DC
  Administered 2012-11-05: 75 ug via TRANSDERMAL
  Filled 2012-11-05: qty 1

## 2012-11-05 MED ORDER — HYDROMORPHONE HCL PF 2 MG/ML IJ SOLN
2.0000 mg | Freq: Once | INTRAMUSCULAR | Status: AC
  Administered 2012-11-05: 2 mg via INTRAMUSCULAR
  Filled 2012-11-05: qty 1

## 2012-11-05 MED ORDER — HYDROMORPHONE HCL PF 2 MG/ML IJ SOLN: 2.0000 mg | Freq: Once | INTRAMUSCULAR | Status: DC

## 2012-11-05 NOTE — ED Provider Notes (Signed)
Medical screening examination/treatment/procedure(s) were performed by non-physician practitioner and as supervising physician I was immediately available for consultation/collaboration.   Gilda Crease, MD 11/05/12 2220
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
## 2012-11-05 NOTE — ED Notes (Signed)
Bed:WA01<BR> Expected date:<BR> Expected time:<BR> Means of arrival:<BR> Comments:<BR>
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
## 2012-11-07 ENCOUNTER — Emergency Department (HOSPITAL_COMMUNITY): Payer: Medicaid Other

## 2012-11-07 ENCOUNTER — Encounter (HOSPITAL_COMMUNITY): Payer: Self-pay | Admitting: Emergency Medicine

## 2012-11-07 ENCOUNTER — Encounter: Payer: Self-pay | Admitting: *Deleted

## 2012-11-07 ENCOUNTER — Inpatient Hospital Stay (HOSPITAL_COMMUNITY)
Admission: EM | Admit: 2012-11-07 | Discharge: 2012-11-09 | DRG: 542 | Disposition: A | Discharge status: Hospice | Payer: Medicaid Other | Attending: Internal Medicine | Admitting: Internal Medicine

## 2012-11-07 DIAGNOSIS — C787 Secondary malignant neoplasm of liver and intrahepatic bile duct: Secondary | ICD-10-CM | POA: Diagnosis present

## 2012-11-07 DIAGNOSIS — K219 Gastro-esophageal reflux disease without esophagitis: Secondary | ICD-10-CM | POA: Diagnosis present

## 2012-11-07 DIAGNOSIS — G893 Neoplasm related pain (acute) (chronic): Secondary | ICD-10-CM | POA: Diagnosis present

## 2012-11-07 DIAGNOSIS — I10 Essential (primary) hypertension: Secondary | ICD-10-CM | POA: Diagnosis present

## 2012-11-07 DIAGNOSIS — C7951 Secondary malignant neoplasm of bone: Principal | ICD-10-CM | POA: Diagnosis present

## 2012-11-07 DIAGNOSIS — Z9221 Personal history of antineoplastic chemotherapy: Secondary | ICD-10-CM

## 2012-11-07 DIAGNOSIS — G8929 Other chronic pain: Secondary | ICD-10-CM

## 2012-11-07 DIAGNOSIS — R52 Pain, unspecified: Secondary | ICD-10-CM

## 2012-11-07 DIAGNOSIS — R748 Abnormal levels of other serum enzymes: Secondary | ICD-10-CM | POA: Diagnosis present

## 2012-11-07 DIAGNOSIS — C50919 Malignant neoplasm of unspecified site of unspecified female breast: Secondary | ICD-10-CM | POA: Diagnosis present

## 2012-11-07 DIAGNOSIS — Z96649 Presence of unspecified artificial hip joint: Secondary | ICD-10-CM

## 2012-11-07 DIAGNOSIS — G609 Hereditary and idiopathic neuropathy, unspecified: Secondary | ICD-10-CM | POA: Diagnosis present

## 2012-11-07 DIAGNOSIS — Z66 Do not resuscitate: Secondary | ICD-10-CM | POA: Diagnosis present

## 2012-11-07 DIAGNOSIS — C7931 Secondary malignant neoplasm of brain: Secondary | ICD-10-CM | POA: Diagnosis present

## 2012-11-07 DIAGNOSIS — K5900 Constipation, unspecified: Secondary | ICD-10-CM | POA: Diagnosis present

## 2012-11-07 DIAGNOSIS — IMO0002 Reserved for concepts with insufficient information to code with codable children: Secondary | ICD-10-CM

## 2012-11-07 DIAGNOSIS — M549 Dorsalgia, unspecified: Secondary | ICD-10-CM

## 2012-11-07 DIAGNOSIS — D649 Anemia, unspecified: Secondary | ICD-10-CM

## 2012-11-07 DIAGNOSIS — R109 Unspecified abdominal pain: Secondary | ICD-10-CM

## 2012-11-07 DIAGNOSIS — E43 Unspecified severe protein-calorie malnutrition: Secondary | ICD-10-CM

## 2012-11-07 LAB — COMPREHENSIVE METABOLIC PANEL
ALT: 24 U/L (ref 0–35)
CO2: 24 mEq/L (ref 19–32)
Calcium: 9.7 mg/dL (ref 8.4–10.5)
Creatinine, Ser: 0.55 mg/dL (ref 0.50–1.10)
GFR calc Af Amer: >90 mL/min (ref >90)
GFR calc non Af Amer: >90 mL/min (ref >90)
Glucose, Bld: 89 mg/dL (ref 70–99)
Sodium: 137 mEq/L (ref 135–145)

## 2012-11-07 LAB — URINALYSIS, ROUTINE W REFLEX MICROSCOPIC
Glucose, UA: NEGATIVE mg/dL
Nitrite: NEGATIVE
Protein, ur: NEGATIVE mg/dL
Urobilinogen, UA: 1 mg/dL (ref 0.0–1.0)

## 2012-11-07 LAB — CBC
Hemoglobin: 9.2 g/dL — ABNORMAL LOW (ref 12.0–15.0)
MCHC: 31.8 g/dL (ref 30.0–36.0)

## 2012-11-07 LAB — URINE MICROSCOPIC-ADD ON

## 2012-11-07 MED ORDER — ONDANSETRON HCL 4 MG/2ML IJ SOLN
4.0000 mg | Freq: Once | INTRAMUSCULAR | Status: AC
  Administered 2012-11-07: 4 mg via INTRAVENOUS
  Filled 2012-11-07: qty 2

## 2012-11-07 MED ORDER — MORPHINE SULFATE 25 MG/ML IV SOLN
5.0000 mg/h | INTRAVENOUS | Status: DC
  Administered 2012-11-07: 5 mg/h via INTRAVENOUS
  Administered 2012-11-07: 2 mg/h via INTRAVENOUS
  Filled 2012-11-07: qty 10

## 2012-11-07 MED ORDER — SODIUM CHLORIDE 0.9 % IV SOLN: INTRAVENOUS | Status: AC

## 2012-11-07 MED ORDER — PANTOPRAZOLE SODIUM 40 MG PO TBEC
40.0000 mg | DELAYED_RELEASE_TABLET | Freq: Every day | ORAL | Status: DC
  Administered 2012-11-07 – 2012-11-09 (×3): 40 mg via ORAL
  Filled 2012-11-07 (×3): qty 1

## 2012-11-07 MED ORDER — ENSURE COMPLETE PO LIQD
237.0000 mL | Freq: Two times a day (BID) | ORAL | Status: DC
  Administered 2012-11-07 – 2012-11-08 (×2): 237 mL via ORAL
  Filled 2012-11-07: qty 237

## 2012-11-07 MED ORDER — FERROUS SULFATE 325 (65 FE) MG PO TABS
325.0000 mg | ORAL_TABLET | Freq: Three times a day (TID) | ORAL | Status: DC
  Filled 2012-11-07 (×3): qty 1

## 2012-11-07 MED ORDER — HYDROMORPHONE HCL PF 2 MG/ML IJ SOLN
2.0000 mg | Freq: Once | INTRAMUSCULAR | Status: AC
  Administered 2012-11-07: 2 mg via INTRAVENOUS
  Filled 2012-11-07: qty 1

## 2012-11-07 MED ORDER — ONDANSETRON HCL 4 MG/2ML IJ SOLN: 4.0000 mg | Freq: Four times a day (QID) | INTRAMUSCULAR | Status: DC | PRN

## 2012-11-07 MED ORDER — POLYETHYLENE GLYCOL 3350 17 G PO PACK
17.0000 g | PACK | Freq: Every day | ORAL | Status: DC
  Administered 2012-11-07: 17 g via ORAL
  Filled 2012-11-07 (×3): qty 1

## 2012-11-07 MED ORDER — FLEET ENEMA 7-19 GM/118ML RE ENEM
1.0000 | ENEMA | Freq: Once | RECTAL | Status: AC | PRN
  Filled 2012-11-07: qty 1

## 2012-11-07 MED ORDER — HYDROMORPHONE HCL PF 1 MG/ML IJ SOLN
1.0000 mg | Freq: Once | INTRAMUSCULAR | Status: AC
  Administered 2012-11-08: 1 mg via INTRAVENOUS
  Filled 2012-11-07: qty 1

## 2012-11-07 MED ORDER — FLUCONAZOLE 100 MG PO TABS
100.0000 mg | ORAL_TABLET | Freq: Every day | ORAL | Status: DC
  Filled 2012-11-07: qty 1

## 2012-11-07 MED ORDER — BISACODYL 10 MG RE SUPP: 10.0000 mg | Freq: Every day | RECTAL | Status: DC | PRN

## 2012-11-07 MED ORDER — HYDROMORPHONE HCL PF 2 MG/ML IJ SOLN: 2.0000 mg | INTRAMUSCULAR | Status: DC | PRN

## 2012-11-07 MED ORDER — ACETAMINOPHEN 650 MG RE SUPP: 650.0000 mg | Freq: Four times a day (QID) | RECTAL | Status: DC | PRN

## 2012-11-07 MED ORDER — ALUM & MAG HYDROXIDE-SIMETH 200-200-20 MG/5ML PO SUSP: 30.0000 mL | Freq: Four times a day (QID) | ORAL | Status: DC | PRN

## 2012-11-07 MED ORDER — FLEET ENEMA 7-19 GM/118ML RE ENEM: 1.0000 | ENEMA | Freq: Once | RECTAL | Status: DC

## 2012-11-07 MED ORDER — CYCLOBENZAPRINE HCL 10 MG PO TABS
10.0000 mg | ORAL_TABLET | Freq: Three times a day (TID) | ORAL | Status: DC | PRN
  Administered 2012-11-07: 10 mg via ORAL
  Filled 2012-11-07 (×2): qty 1

## 2012-11-07 MED ORDER — EVEROLIMUS 5 MG PO TABS
5.0000 mg | ORAL_TABLET | Freq: Every day | ORAL | Status: DC
  Filled 2012-11-07: qty 1

## 2012-11-07 MED ORDER — DOCUSATE SODIUM 100 MG PO CAPS
200.0000 mg | ORAL_CAPSULE | Freq: Two times a day (BID) | ORAL | Status: DC
  Administered 2012-11-07 – 2012-11-09 (×4): 200 mg via ORAL
  Filled 2012-11-07 (×6): qty 2

## 2012-11-07 MED ORDER — SODIUM CHLORIDE 0.9 % IV SOLN
INTRAVENOUS | Status: DC
  Administered 2012-11-07: 19:00:00 via INTRAVENOUS

## 2012-11-07 MED ORDER — EXEMESTANE 25 MG PO TABS
25.0000 mg | ORAL_TABLET | Freq: Every day | ORAL | Status: DC
  Filled 2012-11-07: qty 1

## 2012-11-07 MED ORDER — THIAMINE HCL 100 MG/ML IJ SOLN
100.0000 mg | Freq: Once | INTRAMUSCULAR | Status: AC
  Administered 2012-11-07: 100 mg via INTRAVENOUS
  Filled 2012-11-07: qty 2

## 2012-11-07 MED ORDER — ONDANSETRON HCL 4 MG PO TABS
4.0000 mg | ORAL_TABLET | Freq: Four times a day (QID) | ORAL | Status: DC | PRN
  Administered 2012-11-08: 4 mg via ORAL
  Filled 2012-11-07: qty 1

## 2012-11-07 MED ORDER — SODIUM CHLORIDE 0.9 % IV BOLUS (SEPSIS): 1000.0000 mL | Freq: Once | INTRAVENOUS | Status: DC

## 2012-11-07 MED ORDER — HYDROMORPHONE HCL PF 1 MG/ML IJ SOLN
INTRAMUSCULAR | Status: AC
  Administered 2012-11-07: 22:00:00
  Filled 2012-11-07: qty 1

## 2012-11-07 MED ORDER — DEXAMETHASONE 4 MG PO TABS
4.0000 mg | ORAL_TABLET | Freq: Every day | ORAL | Status: DC
  Administered 2012-11-08 – 2012-11-09 (×2): 4 mg via ORAL
  Filled 2012-11-07 (×5): qty 1

## 2012-11-07 MED ORDER — MORPHINE BOLUS VIA INFUSION
4.0000 mg | INTRAVENOUS | Status: DC | PRN
  Filled 2012-11-07: qty 4

## 2012-11-07 MED ORDER — ACETAMINOPHEN 325 MG PO TABS: 650.0000 mg | ORAL_TABLET | Freq: Four times a day (QID) | ORAL | Status: DC | PRN

## 2012-11-07 MED ORDER — HEPARIN SODIUM (PORCINE) 5000 UNIT/ML IJ SOLN
5000.0000 [IU] | Freq: Three times a day (TID) | INTRAMUSCULAR | Status: DC
  Administered 2012-11-07 – 2012-11-09 (×6): 5000 [IU] via SUBCUTANEOUS
  Filled 2012-11-07 (×9): qty 1

## 2012-11-07 MED ORDER — ZOLPIDEM TARTRATE 5 MG PO TABS
5.0000 mg | ORAL_TABLET | Freq: Every evening | ORAL | Status: DC | PRN
  Administered 2012-11-07 – 2012-11-08 (×2): 5 mg via ORAL
  Filled 2012-11-07 (×2): qty 1

## 2012-11-07 MED ORDER — HYDROMORPHONE HCL PF 1 MG/ML IJ SOLN
1.0000 mg | Freq: Once | INTRAMUSCULAR | Status: AC
  Administered 2012-11-07: 1 mg via INTRAVENOUS
  Filled 2012-11-07: qty 1

## 2012-11-07 NOTE — Progress Notes (Signed)
ED CM left a voice message for Kathrin Penner Oncology CM to see if there is available resources for the pt Requested a return call to CM mobile number

## 2012-11-07 NOTE — Progress Notes (Signed)
[facility] ED CM noted CM consult for difficulty with medications  CM spoke with pt who now reports she has recently had to start paying for her medication States because of the various types and amount of medications she can not afford to get them and has not taken some of her medications Cm reviewed hospice home providers and hospice facility services Pt reports she had obtained her own home staff but no longer prefers to use this staff.  Cm reviewed with pt that [facility] facilities are unable to provide funds for cost of medications Pt voiced understanding CM inquired about possible local churches she may be able to request assistance for funding from and about home support She did not answer and provide information about her support system except repeated that "I know I am not in control anymore" and " I would rather not talk about them" ED RN present to inform pt of new room assignment CM informed pt Cm would speak with [facility] Oncology SW for possible assistance Pt voiced understanding and confirmed Leotis Shames was her SW

## 2012-11-07 NOTE — ED Notes (Signed)
Patient transported to X-ray 

## 2012-11-07 NOTE — H&P (Addendum)
[facility] History and Physical  [name] WGN:[id number] DOB: 1966-02-01 DOA: 11/07/2012  Referring physician: Dr. Doug Sou PCP: Pcp Not In System  Oncologist: Dr. Darnelle Catalan  Chief Complaint: Uncontrolled pain.    History of Present Illness: [name] is an 47 y.o. female with PMH of metastatic breast cancer, chronic pain, who has been managed by Dr. Darnelle Catalan, who presents with uncontrolled pain in abdomen and legs.  The pain in her legs is burning in quality. Her abdominal pain and distention appear to be from metastatic involvement and constipation. Pain films in the emergency department did not show any evidence of obstruction or free air. The patient tells me that she had been taking Percocet, 2-3 tablets every 6 hours with no significant relief.  Her aunt became concerned about her escalating use of pain medications, and took away her Percocet so that she could administer them to her.  She reports that she had some nausea and vomiting last night.  Her last use of Percocet was around 0400.  The patient was referred for hospital inpatient admission secondary to uncontrolled pain in the setting of advanced malignancy.  Review of Systems: Constitutional: No fever, no chills;  Appetite diminished; + weight loss, no weight gain.  HEENT: No blurry vision, no diplopia, no pharyngitis, no dysphagia CV: No chest pain, no palpitations.  Resp: No SOB, no cough. GI: + nausea, +vomiting, no diarrhea, no melena, no hematochezia, +constipation and hemorrhoidal irritation.  GU: No dysuria, no hematuria.  MSK: + burning type myalgias, no arthralgias.  Neuro:  No headache, no focal neurological deficits, no history of seizures, +ST memory problems.  Psych: No depression, + anxiety.  Endo: No thyroid disease, no DM, no heat intolerance, + cold intolerance, no polyuria, no polydipsia  Skin: No rashes, no skin lesions.  Heme: No easy bruising, no history of blood diseases.  Past Medical  History Past Medical History  Diagnosis Date  . Hypertension   . GERD (gastroesophageal reflux disease)   . 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 Pyelonephritis 10/01/11  . Recurrent UTI (urinary tract infection) 10/01/11  . S/P chemotherapy, time since 4-12 weeks   . S/P radiation > 12 weeks 03/15/12- 04/07/12    right hip, clivus  . 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 Brain cancer     spread from breast ca  . Hypercalcemia 06/21/2012  . History of radiation therapy 08/06/10 -09/16/10    R supraclav fossa, R chest wall, R drain site  . radiaiton therapy 07/22/12 -08/15/12    T4-T7     Past Surgical History Past Surgical History  Procedure Laterality Date  . Cesarean section  1989; 2010  . Mastectomy  2011    right  . Back surgery  2012    "removed tumor,cancer, from my spine  . Port-a-cath removal  2012    right chest  . Portacath placement  2011    right  . Tubal ligation  2010  . Breast biopsy  2011    right  . Hip arthroplasty  05/04/2012    Procedure: ARTHROPLASTY BIPOLAR HIP;  Surgeon: Shelda Pal, MD;  Location: [facility];  Service: Orthopedics;  Laterality: Right;  RIGHT HIP HEMI ARTHROPLASTY      Social History: History   Social History  . Marital Status: Single    Spouse Name: N/A    Number of Children: 2  . Years of  Education: N/A   Occupational History  . Disabled, fast Catering manager in past.    Social History Main Topics  . Smoking status: Former Smoker -- 0.25 packs/day for 31 years    Types: Cigarettes    Quit date: 04/26/2012  . Smokeless tobacco: Never Used  . Alcohol Use: No  . Drug Use: Yes    Special: "Crack" cocaine, Marijuana     Comment: Crack use in past, last used 09/2011.  Denies active use now.  . Sexually Active: Not Currently   Other Topics Concern  . Not on file   Social History Narrative   Single.  Lives alone.  H/O substance abuse.      Family History:  Family History  Problem  Relation Age of Onset  . Cancer Brother 64    died of colon cancer at age of 31  . High blood pressure Mother     Allergies: Review of patient's allergies indicates no known allergies.  Meds: Prior to Admission medications   Medication Sig Start Date End Date Taking? Authorizing Provider  bisacodyl (DULCOLAX) 10 MG suppository Place 10 mg rectally daily as needed for constipation.   Yes Historical Provider, MD  cyclobenzaprine (FLEXERIL) 10 MG tablet Take 10 mg by mouth 3 (three) times daily as needed for muscle spasms.   Yes Historical Provider, MD  dexamethasone (DECADRON) 4 MG tablet Take 4 mg by mouth daily with breakfast.   Yes Historical Provider, MD  exemestane (AROMASIN) 25 MG tablet Take 25 mg by mouth daily after breakfast. 05/03/12  Yes Lowella Dell, MD  fluconazole (DIFLUCAN) 100 MG tablet Take 100 mg by mouth daily.   Yes Historical Provider, MD  oxyCODONE-acetaminophen (PERCOCET) 10-325 MG per tablet Take 1-2 tablets by mouth every 6 (six) hours as needed for pain.   Yes Historical Provider, MD  zolpidem (AMBIEN) 5 MG tablet Take 5 mg by mouth at bedtime as needed for sleep.   Yes Historical Provider, MD  docusate sodium 100 MG CAPS Take 200 mg by mouth 2 (two) times daily. 10/28/12   Lowella Dell, MD  emollient (BIAFINE) cream Apply 1 application topically 2 (two) times daily. Apply  Cream Biafine  To mid back down to sacrum twice daily starting today after radiation treatment, for 1-2 weeks to prevent desqaumation 10/28/12 11/28/12  [name], MD  everolimus (AFINITOR) 5 MG tablet Take 5 mg by mouth daily. 06/09/12   Amy Allegra Grana, PA-C  fentaNYL (DURAGESIC - DOSED MCG/HR) 75 MCG/HR Place 2 patches onto the skin every 3 (three) days.    Historical Provider, MD  ferrous sulfate 325 (65 FE) MG tablet Take 325 mg by mouth 3 (three) times daily after meals. 05/06/12   Genelle Gather Babish, PA-C  oxyCODONE-acetaminophen (PERCOCET) 10-325 MG per tablet Take 1-2 tablets by  mouth every 6 (six) hours as needed for pain. 11/04/12   Victorino December, MD  pantoprazole (PROTONIX) 40 MG tablet Take 40 mg by mouth daily.    Historical Provider, MD  prochlorperazine (COMPAZINE) 10 MG tablet Take 1 tablet (10 mg total) by mouth every 6 (six) hours as needed (nausea or vomiting). 10/29/12 10/29/13  Lowella Dell, MD    Physical Exam: Filed Vitals:   11/07/12 1039 11/07/12 1100 11/07/12 1130 11/07/12 1200  BP: 156/75 154/88 140/80 157/91  Pulse: 108 109 109 114  Temp:      TempSrc:      Resp: 16     SpO2: 98% 97% 99%  98%     Physical Exam: Blood pressure 157/91, pulse 114, temperature 98.2 F (36.8 C), temperature source Oral, resp. rate 16, SpO2 98.00%. Gen: No acute distress. Head: Normocephalic, atraumatic. Eyes: PERRL, EOMI, sclerae nonicteric. Mouth: Oropharynx clear. No mucositis or thrush. Neck: Supple, no thyromegaly, no lymphadenopathy, no jugular venous distention. Chest: Lungs diminished in the bases. CV: Heart sounds are tachycardic. No murmurs, rubs, or gallops. Abdomen: Distended and firm, nontender, positive bowel sounds. Extremities: Extremities are without clubbing, edema, or cyanosis.  Skin: Warm and dry. Neuro: Alert and oriented times 3; cranial nerves II through XII grossly intact. Psych: Mood and affect anxious.  Labs on Admission:  Basic Metabolic Panel:  Recent Labs Lab 10/31/12 1427 11/07/12 0905  NA 137 137  K 3.9 4.1  CL 98 97  CO2 29 24  GLUCOSE 106* 89  BUN 14.7 10  CREATININE 0.7 0.55  CALCIUM 9.4 9.7   Liver Function Tests:  Recent Labs Lab 10/31/12 1427 11/07/12 0905  AST 128* 80*  ALT 57* 24  ALKPHOS 1,005* 1043*  BILITOT 1.03 0.7  PROT 7.5 8.1  ALBUMIN 2.8* 3.0*   CBC:  Recent Labs Lab 10/31/12 1427 11/07/12 0905  WBC 8.4 5.1  NEUTROABS 7.2*  --   HGB 9.2* 9.2*  HCT 27.9* 28.9*  MCV 83.9 82.3  PLT 340 425*    Radiological Exams on Admission: Dg Abd Acute W/chest  11/07/2012   *RADIOLOGY  REPORT*  Clinical Data: Metastatic cancer.  Pain and vomiting.  ACUTE ABDOMEN SERIES (ABDOMEN 2 VIEW & CHEST 1 VIEW)  Comparison: MRI 10/28/2012.  CT 08/04/2012.  Findings: There is increased soft tissue density in the upper and mid abdomen consistent with organomegaly due to massive metastatic disease.  Intestinal gas is displaced caudally.  No sign of bowel obstruction.  No free air.  Bony metastatic disease remains evident.  One-view chest shows the heart at the upper limits of normal in size.  No free air seen under the diaphragm.  Bony metastatic disease remains evident.  Lungs are clear.  IMPRESSION: Increased soft tissue density in the upper abdomen consistent with organomegaly/massive metastatic disease.  Displacement of the bowel caudally.  No sign of obstruction or free air however.   Original Report Authenticated By: Paulina Fusi, M.D.    Assessment/Plan Principal Problem:   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, brain and bone / pain related to malignancy -Continue Decadron. Discontinue Duragesic patch and Percocet and placed on a continuous morphine drip at 2 mg per hour with 4 mg boluses q. one hour as needed for breakthrough pain. Pain management regimen discussed with the patient's oncologist who agrees with this plan of care. -Continue Flexeril as needed for muscle spasms. -Continue Afinitor and Aromasin for now. -Has refused palliative care consultation the past, but oncologist will speak with her about her prognosis and revisit the subject. Active Problems:   Severe protein calorie malnutrition -Secondary cancer related cachexia. Ensure supplements and a dietitian consultation ordered.   GERD (gastroesophageal reflux disease) -Continue PPI therapy.   Hypertension -Not on home antihypertensives. Monitor.   Hypercalcemia -Hydrate. -Corrected calcium is 10.5.   Constipation -Continue Colace 200 mg twice a day. -Continue Dulcolax suppositories as needed.   Normocytic  anemia -Continue iron supplementation.   Abdominal pain -No evidence of obstruction. Bowel regimen for constipation.   Alkaline phosphatase elevation -Secondary to liver and bone metastasis.   Code Status: DNR Family Communication: None at bedside. Disposition Plan: ? Residential hospice.  Time spent: One  hour.  Patrece Tallie [facility] Pager [phone]  If 7PM-7AM, please contact night-coverage [email or web address] Password [facility] 11/07/2012, 12:29 PM

## 2012-11-07 NOTE — Clinical Social Work Psychosocial (Unsigned)
     Clinical Social Work Department BRIEF PSYCHOSOCIAL ASSESSMENT 11/07/2012  Patient:  [name], [name]     Account Number:  [id number]     Admit date:  11/07/2012  Clinical Social Worker:  Hattie Perch  Date/Time:  11/07/2012 12:00 M  Referred by:  Physician  Date Referred:  11/07/2012 Referred for  Residential hospice placement   Other Referral:   Interview type:  Patient Other interview type:   sister at bedside    PSYCHOSOCIAL DATA Living Status:  ALONE Admitted from facility:   Level of care:   Primary support name:  [name] Primary support relationship to patient:  CHILD, ADULT Degree of support available:   good    CURRENT CONCERNS Current Concerns  Post-Acute Placement   Other Concerns:    SOCIAL WORK ASSESSMENT / PLAN CSW met with patient. patient is alert and oriented X3. patient is very distressed and upset. patient states that the morphiene drip helps control her pain. CSW discussed residential hospice with patient. patient is agreeable but sister is resistent as she wants patient to come home. patient is agreeable to meet with [facility] liason.   Assessment/plan status:   Other assessment/ plan:   Information/referral to community resources:    PATIENTS/FAMILYS RESPONSE TO PLAN OF CARE: patient is distressed about current condition but agreeable to meet with [facility] liason. CSW notified Forrestine Him of patient status.

## 2012-11-07 NOTE — ED Notes (Signed)
Pt presenting to ed with c/o pain all over with nausea and vomiting decreased appetite and generalized weakness pt states seen recently for the same medications are not helping

## 2012-11-07 NOTE — Consult Note (Signed)
Consult note:   ID: [name]   DOB: 05/08/1966  MR#: [id number]  OZH#:[id number]    PCP: Pcp Not In System   HISTORY OF PRESENT ILLNESS: The patient delivered a child on 05/17/2009, after which she felt fullness in the right breast. She eventually brought this to her doctors attention and was scheduled for mammography and ultrasound on 03/08/2009. There were no prior exams for comparison. Mammography showed the breast to be dense, with a 5 cm obscured mass in the outer midportion of the right breast. Mass was easily palpable, and there was also a mass in the outer right periareolar region. They were suspicious microcalcifications in the outer midportion of the right breast, as well as within the mass itself. Ultrasound showed an extensive heterogeneous area, corresponding to the palpable abnormality with multiple solid and cystic foci. Overall, the mass measured in excess of 5 cm, in addition to cysts in the breast.   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 The tumors were ER +100%, PR +80%.   Bilateral breast MRI was obtained 03/22/2009. There was a large area of abnormal enhancement in the lower outer quadrant of the right breast measuring up to 9.7 cm. No abnormal appearing lymph nodes and no other masses in either breast.   Patient underwent definitive right mastectomy and sentinel lymph node sampling on 04/29/2009 under the care of Dr. Derrell Lolling. 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 Repeat prognostic panel showed tumor to be ER +93% and PR +51%. HER-2/neu was negative with a ratio of 0.71, with proliferation marker of 36%.   Patient was evaluated by Dr. Darnelle Catalan in January 2011 but failed to keep appointments until she was seen here again in July 2011. In July, she began adjuvant  chemotherapy. She received 4 cycles of dose dense doxorubicin and cyclophosphamide followed by 4 weekly doses of paclitaxel which was discontinued in November 2011 secondary to peripheral neuropathy. The patient then received radiation therapy to the right chest wall which was truncated due to problems with missing appointments.   Patient was started on tamoxifen briefly in June of 2012 which was discontinued due to nausea. The nausea continued, and an abdominal CT in October 2012 confirmed a growth in liver lesions. These were biopsied on 03/10/2011 and showed metastatic adenocarcinoma, still ER positive at 100%, PR +5%. Her subsequent treatments are as detailed below   INTERVAL HISTORY: Dorethawas readmitted today with uncontrolled pain. Marland KitchenShe was also seen in the ED over the weekend. I understand her family felt she was taking too many percocet. The patient tells me no matter how many she took they weren't working. At the ED this AM a KUB was obtained c/w disease progression. I have discussed the implications with Marlaina and we are going to switch to a supportive/ comfort care mode.  REVIEW OF SYSTEMS: Aside from the pain, which involves not only the right leg but the abdomen as well, she is constipated. She is depressed. She denies unusual headaches, nausea or vomiting. She is very weak. "I can't do it any more." She asked me to call her auntie, and we had a discussion of what it means to switch to palliation and plans for [name] if a bed is open. The auntie tells me the aptient's sisters are very angry at her because of the drug issues.A detailed  review of systems was otherwise unchanged  PAST MEDICAL HISTORY: Past Medical History   Diagnosis  Date   .  Hypertension     .  GERD (gastroesophageal reflux disease)     .  Bone metastases  2012       "spread from my breast"   .  Pyelonephritis  10/01/11   .  Recurrent UTI (urinary tract infection)  10/01/11   .  S/P chemotherapy, time since 4-12  weeks     .  S/P radiation > 12 weeks  03/15/12- 04/07/12       right hip, clivus   .  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  03/21/2011       bone and brain   .  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  Hypercalcemia  06/21/2012   .  History of radiation therapy  08/06/10 -09/16/10       R supraclav fossa, R chest wall, R drain site   .  radiaiton therapy  07/22/12 -08/15/12       T4-T7        PAST SURGICAL HISTORY: Past Surgical History   Procedure  Laterality  Date   .  Cesarean section    1989; 2010   .  Mastectomy    2011       right   .  Back surgery    2012       "removed tumor,cancer, from my spine   .  Port-a-cath removal    2012       right chest   .  Portacath placement    2011       right   .  Tubal ligation    2010   .  Breast biopsy    2011       right   .  Hip arthroplasty    05/04/2012       Procedure: ARTHROPLASTY BIPOLAR HIP;  Surgeon: Shelda Pal, MD;  Location: [facility];  Service: Orthopedics;  Laterality: Right;  RIGHT HIP HEMI ARTHROPLASTY      FAMILY HISTORY The patient's father is alive at age 6.  The patient's mother is alive at age 23.  She has four sisters.  She had one brother who died from colon cancer at the age of 33.  There is no breast or ovarian cancer history in the family to her knowledge.   GYNECOLOGIC HISTORY: She is GX P2.  First pregnancy to term at age 47.     SOCIAL HISTORY: The patient is not employed.  She lives by herself (her small son whose name is [name] is currently staying with his dad in [place]).  Her first child, [name], is studying business in this area.  The patient has one grandchild.  She attends the [employer].               ADVANCED DIRECTIVES: not in place   HEALTH MAINTENANCE: History   Substance Use Topics   .  Smoking status:  Former Smoker -- 0.25 packs/day for 31 years       Types:  Cigarettes       Quit date:  04/26/2012   .  Smokeless tobacco:  Never Used   .  Alcohol Use:  No      No Known Allergies    Current Outpatient Prescriptions   Medication  Sig  Dispense  Refill   .  cyclobenzaprine (FLEXERIL) 10 MG tablet  Take 1 tablet (10 mg total) by mouth 3 (three) times daily as needed for muscle spasms.   30 tablet   1   .  dexamethasone (DECADRON) 4 MG tablet  Take 2 tablets (8 mg total) by mouth every 12 (twelve) hours.   60 tablet   0   .  everolimus (AFINITOR) 5 MG tablet  Take 5 mg by mouth daily.         [name]  exemestane (AROMASIN) 25 MG tablet  Take 25 mg by mouth daily after breakfast.         .  feeding supplement (ENSURE COMPLETE) LIQD  Take 237 mLs by mouth 2 (two) times daily between meals.   24 Bottle   11   .  ferrous sulfate 325 (65 FE) MG tablet  Take 325 mg by mouth 3 (three) times daily after meals.         .  fluconazole (DIFLUCAN) 100 MG tablet  Take 1 tablet (100 mg total) by mouth daily.   301 tablet   1   .  ondansetron (ZOFRAN) 4 MG tablet  Take 1 tablet (4 mg total) by mouth every 6 (six) hours.   30 tablet   1   .  OxyCODONE HCl ER 60 MG T12A  Take 1 tablet by mouth 2 (two) times daily.   14 each   0   .  oxyCODONE-acetaminophen (PERCOCET) 10-325 MG per tablet  Take 1-2 tablets by mouth every 6 (six) hours as needed for pain.   56 tablet   0   .  pantoprazole (PROTONIX) 40 MG tablet  Take 1 tablet (40 mg total) by mouth daily.   30 tablet   3   .  zolpidem (AMBIEN) 5 MG tablet  Take 1 tablet (5 mg total) by mouth at bedtime as needed for sleep.   30 tablet   1   .  [DISCONTINUED] Alum & Mag Hydroxide-Simeth (MAGIC MOUTHWASH W/LIDOCAINE) SOLN  Take 5 mLs by mouth 3 (three) times daily as needed (take 15 minutes prior to meals.).   480 mL   0       Scheduled Meds: . sodium chloride   Intravenous STAT  . [START ON 11/08/2012] dexamethasone  4 mg Oral Q breakfast  . docusate sodium  200 mg Oral BID  . everolimus  5 mg Oral Daily  . [START ON 11/08/2012] exemestane  25 mg Oral QPC breakfast  . feeding supplement  237 mL Oral BID BM  . ferrous  sulfate  325 mg Oral TID PC  . fluconazole  100 mg Oral Daily  . heparin  5,000 Units Subcutaneous Q8H  . pantoprazole  40 mg Oral Daily  . sodium chloride  1,000 mL Intravenous Once   Continuous Infusions: . sodium chloride    . morphine 2 mg/hr (11/07/12 1303)   PRN Meds:.acetaminophen, acetaminophen, alum & mag hydroxide-simeth, bisacodyl, cyclobenzaprine, HYDROmorphone (DILAUDID) injection, morphine, ondansetron (ZOFRAN) IV, ondansetron, sodium phosphate, zolpidem        OBJECTIVE: Middle-aged [place] American woman examined in bed   Filed Vitals:   11/07/12 1256  BP: 159/86  Pulse: 112  Temp: 98.9 F (37.2 C)  Resp:     Sclerae unicteric Oropharynx clear No cervical or supraclavicular adenopathy Lungs diminished breath sounds bilaterally, R>L Heart regular rate and rhythm Abdomen firm, distended, with massive hepatomegaly, NT to mild palpation Minimal bilateral ankle edema. No upper extremity edema Neuro: nonfocal, well  oriented, tearful affect Breasts: Deferred     LAB RESULTS: Basic Metabolic Panel:  Recent Labs Lab 10/31/12 1427 11/07/12 0905  NA 137 137  K 3.9 4.1  CL 98 97  CO2 29 24  GLUCOSE 106* 89  BUN 14.7 10  CREATININE 0.7 0.55  CALCIUM 9.4 9.7   GFR Estimated Creatinine Clearance: 65.5 ml/min (by C-G formula based on Cr of 0.55). Liver Function Tests:  Recent Labs Lab 10/31/12 1427 11/07/12 0905  AST 128* 80*  ALT 57* 24  ALKPHOS 1,005* 1043*  BILITOT 1.03 0.7  PROT 7.5 8.1  ALBUMIN 2.8* 3.0*   No results found for this basename: LIPASE, AMYLASE,  in the last 168 hours No results found for this basename: AMMONIA,  in the last 168 hours Coagulation profile No results found for this basename: INR, PROTIME,  in the last 168 hours  CBC:  Recent Labs Lab 10/31/12 1427 11/07/12 0905  WBC 8.4 5.1  NEUTROABS 7.2*  --   HGB 9.2* 9.2*  HCT 27.9* 28.9*  MCV 83.9 82.3  PLT 340 425*   Cardiac Enzymes: No results found for  this basename: CKTOTAL, CKMB, CKMBINDEX, TROPONINI,  in the last 168 hours BNP: No components found with this basename: POCBNP,  CBG: No results found for this basename: GLUCAP,  in the last 168 hours D-Dimer No results found for this basename: DDIMER,  in the last 72 hours Hgb A1c No results found for this basename: HGBA1C,  in the last 72 hours Lipid Profile No results found for this basename: CHOL, HDL, LDLCALC, TRIG, CHOLHDL, LDLDIRECT,  in the last 72 hours Thyroid function studies No results found for this basename: TSH, T4TOTAL, FREET3, T3FREE, THYROIDAB,  in the last 72 hours Anemia work up No results found for this basename: VITAMINB12, FOLATE, FERRITIN, TIBC, IRON, RETICCTPCT,  in the last 72 hours Microbiology No results found for this or any previous visit (from the past 240 hour(s)).   STUDIES: Dg Pelvis 1-2 Views  10/16/2012   *RADIOLOGY REPORT*  Clinical Data: Worsening left groin and hip pain.  PELVIS - 1-2 VIEW  Comparison: 09/27/2012  Findings: Right hip prosthesis remains in appropriate position.  No evidence of pelvic fracture or diastasis.  No other pelvic bone abnormality identified.  Small calcified fibroids again seen in the central pelvis.  IMPRESSION:  1.  No acute findings. 2.  Right hip prosthesis in appropriate position. 3.  Small calcified fibroids.   Original Report Authenticated By: Myles Rosenthal, M.D.   Mr Thoracic Spine W Wo Contrast  10/27/2012   *RADIOLOGY REPORT*  Clinical Data:  Metastatic breast cancer status post radiation therapy.  Severe low back pain with worsening left leg pain.  MRI THORACIC AND LUMBAR SPINE WITHOUT AND WITH CONTRAST  Technique:  Multiplanar and multiecho pulse sequences of the thoracic and lumbar spine were obtained without and with intravenous contrast.  Contrast: 9mL MULTIHANCE GADOBENATE DIMEGLUMINE 529 MG/ML IV SOLN  Comparison:  CTs of the chest, abdomen and pelvis 08/04/2012. Thoracic MRI 07/21/2012.  Lumbar MRI 03/13/2011.   MRI THORACIC SPINE  Findings: Diffuse metastatic disease to the cervicothoracic spine and multiple ribs is again noted.  No pathologic fractures are identified.  There are stable postsurgical changes status post T9- T11 posterior decompression.  No significant epidural disease is identified within the thoracic spine.  There is disease at the thoracolumbar junction, further described on the lumbar spine report below.  The thoracic cord is normal in signal and caliber.  There  is no abnormal intradural enhancement.  A right pleural effusion and multifocal hepatic metastatic disease are noted.  IMPRESSION:  1.  No significant change in multifocal osseous metastases. 2.  No pathologic fracture or recurrent epidural tumor is identified within the thoracic spine.  See lumbar spine findings below. 3.  Hepatic metastatic disease.  MRI LUMBAR SPINE  Findings: Multifocal metastases are demonstrated within the lumbar spine, upper sacrum and iliac bones bilaterally.  These have progressed from the 2012 MRI, but are similar to the more recent abdominal pelvic CT.  The spinal involvement is greatest at L1 and L2, although there is also significant involvement within the L2 vertebral body and its right pedicle, the right L5 pedicle, in the upper and mid sacrum. No pathologic fractures are identified.  Epidural tumor associated with the L1 vertebral body and its right pedicle is asymmetric to the right, extends into the right L1-L2 foramen and moderately compresses the thecal sac. The associated mass effect on the thecal sac has mildly worsened since the CT of 10 weeks ago.  There is significant epidural tumor bilaterally at L3 with resulting moderate compression of the thecal sac and right greater than left L3-L4 neural foraminal extension.  This appears similar to the prior CT.  A small amount of epidural tumor is noted laterally in the right L5-S1 foramen related to tumor in the right L5 pedicle.  There is also some epidural  tumor associated with an expansile metastasis within the third sacral segment.  No abnormal intradural enhancement is identified.  There are no paraspinal masses.  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  IMPRESSION:  1.  Slowly progressive widespread metastatic disease to the lumbar spine, sacrum and iliac bones as described.  No pathologic fractures identified. 2.  There is associated epidural tumor, especially on the right at L1-L2 and bilaterally at L3-L4. At both levels, there is deformity of the thecal sac and neural foraminal extension asymmetric to the right.  There is some epidural tumor on the left at L3 which could account for the patient's symptoms.  The epidural disease appears only slightly progressive compared with the abdominal CT of 10 weeks prior.   Original Report Authenticated By: Carey Bullocks, M.D.   Mr Lumbar Spine W Wo Contrast  10/27/2012   *RADIOLOGY REPORT*  Clinical Data:  Metastatic breast cancer status post radiation therapy.  Severe low back pain with worsening left leg pain.  MRI THORACIC AND LUMBAR SPINE WITHOUT AND WITH CONTRAST  Technique:  Multiplanar and multiecho pulse sequences of the thoracic and lumbar spine were obtained without and with intravenous contrast.  Contrast: 9mL MULTIHANCE GADOBENATE DIMEGLUMINE 529 MG/ML IV SOLN  Comparison:  CTs of the chest, abdomen and pelvis 08/04/2012. Thoracic MRI 07/21/2012.  Lumbar MRI 03/13/2011.  MRI THORACIC SPINE  Findings: Diffuse metastatic disease to the cervicothoracic spine and multiple ribs is again noted.  No pathologic fractures are identified.  There are stable postsurgical changes status post T9- T11 posterior decompression.  No significant epidural disease is identified within the thoracic spine.  There is disease at the thoracolumbar junction, further described on the lumbar spine report below.  The thoracic cord is normal in signal and caliber.  There is no abnormal intradural enhancement.  A right pleural  effusion and multifocal hepatic metastatic disease are noted.  IMPRESSION:  1.  No significant change in multifocal osseous metastases. 2.  No pathologic fracture or recurrent epidural tumor is identified within the thoracic spine.  See lumbar spine findings  below. 3.  Hepatic metastatic disease.  MRI LUMBAR SPINE  Findings: Multifocal metastases are demonstrated within the lumbar spine, upper sacrum and iliac bones bilaterally.  These have progressed from the 2012 MRI, but are similar to the more recent abdominal pelvic CT.  The spinal involvement is greatest at L1 and L2, although there is also significant involvement within the L2 vertebral body and its right pedicle, the right L5 pedicle, in the upper and mid sacrum. No pathologic fractures are identified.  Epidural tumor associated with the L1 vertebral body and its right pedicle is asymmetric to the right, extends into the right L1-L2 foramen and moderately compresses the thecal sac. The associated mass effect on the thecal sac has mildly worsened since the CT of 10 weeks ago.  There is significant epidural tumor bilaterally at L3 with resulting moderate compression of the thecal sac and right greater than left L3-L4 neural foraminal extension.  This appears similar to the prior CT.  A small amount of epidural tumor is noted laterally in the right L5-S1 foramen related to tumor in the right L5 pedicle.  There is also some epidural tumor associated with an expansile metastasis within the third sacral segment.  No abnormal intradural enhancement is identified.  There are no paraspinal masses.  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  IMPRESSION:  1.  Slowly progressive widespread metastatic disease to the lumbar spine, sacrum and iliac bones as described.  No pathologic fractures identified. 2.  There is associated epidural tumor, especially on the right at L1-L2 and bilaterally at L3-L4. At both levels, there is deformity of the thecal sac and neural  foraminal extension asymmetric to the right.  There is some epidural tumor on the left at L3 which could account for the patient's symptoms.  The epidural disease appears only slightly progressive compared with the abdominal CT of 10 weeks prior.   Original Report Authenticated By: Carey Bullocks, M.D.   Mr Liver W Wo Contrast  10/28/2012   *RADIOLOGY REPORT*  Clinical Data:  History breast cancer with widespread metastatic disease.  Follow-up evaluation of liver metastases.  MRI ABDOMEN WITHOUT AND WITH CONTRAST  Technique:  Multiplanar multisequence MR imaging of the abdomen was performed both before and after the administration of intravenous contrast.  Contrast: 9mL MULTIHANCE GADOBENATE DIMEGLUMINE 529 MG/ML IV SOLN  Comparison:  Multiple priors, most recently a CT of the chest abdomen and pelvis 08/04/2012.  Findings:  Again noted are innumerable hepatic nodules and masses. These are heterogeneous in signal intensity on all pulse sequences, including high signal intensity areas on pre gadolinium T1-weighted images, compatible with areas of intratumoral hemorrhage and/or highly proteinaceous content.  Post-gadolinium images demonstrates predominately peripheral enhancement which is progressive over time and slowly increasing on more delayed sequences.  The largest single mass is located in the central aspect of the liver measuring up to 12.3 x 10.7 x 11.9 cm (image 33 of series 13), slightly larger than the prior examination from 08/04/2012.  Previously noted lesion in the inferior aspect of segment 4B of the liver (image 76 of series 13) has also increased, currently measuring 7.0 x 6.3 cm (previously 6.0 x 5.9 cm).  No definite new hepatic lesions are noted.  Parenchymal thinning is again noted in the lateral aspect of the left kidney, compatible with some mild chronic scarring.  The appearance of the gallbladder, pancreas, spleen, bilateral adrenal glands and right kidney is unremarkable.  Small bilateral  pleural effusions (right greater than left) layering dependently.  Diffusely heterogeneous appearance of the visualized axial and appendicular skeleton with multiple enhancing lesions, compatible with widespread metastatic disease to the bones.  This includes a large expansile lesion in the posterior aspect of the right 12th rib with probable pathologic fracture (image 42 of series 1101). Additionally, at L1-L2 and L3-L4 there is a large amount of enhancing epidural soft tissue.  IMPRESSION: 1.  Findings, as above, compatible with progressive metastatic disease to the liver with enlarging hepatic nodules and masses, as above.  No definite new hepatic lesions are identified. 2.  Widespread metastatic disease to the bones redemonstrated. This includes a pathologic fracture of the posterior aspect of the right 12th rib, and epidural extension of tumor at the level of L1-L2 and L3-L4. 3.  Small bilateral pleural effusions (right greater than left), similar to the prior examination.   Original Report Authenticated By: Trudie Reed, M.D.   Dg Abd Acute W/chest  11/07/2012   *RADIOLOGY REPORT*  Clinical Data: Metastatic cancer.  Pain and vomiting.  ACUTE ABDOMEN SERIES (ABDOMEN 2 VIEW & CHEST 1 VIEW)  Comparison: MRI 10/28/2012.  CT 08/04/2012.  Findings: There is increased soft tissue density in the upper and mid abdomen consistent with organomegaly due to massive metastatic disease.  Intestinal gas is displaced caudally.  No sign of bowel obstruction.  No free air.  Bony metastatic disease remains evident.  One-view chest shows the heart at the upper limits of normal in size.  No free air seen under the diaphragm.  Bony metastatic disease remains evident.  Lungs are clear.  IMPRESSION: Increased soft tissue density in the upper abdomen consistent with organomegaly/massive metastatic disease.  Displacement of the bowel caudally.  No sign of obstruction or free air however.   Original Report Authenticated By: Paulina Fusi, M.D.    Dg Hip Complete Left   09/27/2012   *RADIOLOGY REPORT*  Clinical Data: History of painful left hip.  History of painful fever.  History of metastatic breast carcinoma.  LEFT HIP - COMPLETE 2+ VIEW Comparison: CT 08/06/2012.  Findings: On the CT there is some subtle blastic disease seen in the medial aspect of the left iliac bone.  There is also slight increased density and sclerosis on the plain imaging area.  No pathological fracture is evident.  No definite lytic or blastic process is seen in the left proximal femur or left hip.  Previous right hip arthroplasty has been performed without evidence of disruption of hardware.  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  IMPRESSION: Blastic metastatic disease involving the medial aspect of the left iliac bone.  No definite blastic or lytic lesion is seen within the proximal femur or hip.  No pathological fractures evident. Post right hip arthroplasty.  Calcified uterine leiomyomas.   Original Report Authenticated By: Onalee Hua Call    Dg Femur Left   09/27/2012   *RADIOLOGY REPORT*  Clinical Data: History of pain in the left femur.  No known injury. History of metastatic breast carcinoma.  LEFT FEMUR - 2 VIEW  Comparison: 08/04/2012.  Findings: No definite lytic or blastic lesion is seen within the femur.  No fracture dislocation is evident.  No soft tissue lesion is seen.  IMPRESSION: No femoral lesion is evident.   Original Report Authenticated By: Onalee Hua Call    Dg Abd 1 View   09/27/2012   *RADIOLOGY REPORT*  Clinical Data: Metastatic breast cancer.  Abdominal discomfort and distension.  ABDOMEN - 1 VIEW  Comparison: 09/01/2012  Findings: Diffuse osseous metastatic disease.  Enlarged liver displacing bowel.  Nonspecific bowel gas pattern with gas filled bowel left lower quadrant of the abdomen/pelvis possibly representing colon rather than distended small bowel loops.  IMPRESSION: Enlarged liver with displacement of bowel.  No  definitive findings of bowel obstruction.  Diffuse osseous metastatic disease.   Original Report Authenticated By: Lacy Duverney, M.D.        ASSESSMENT:    47 y.o. [place] woman     1. 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      2.Status post adjuvant chemotherapy July to November 2011, consisting of 4 cycles of dose dense doxorubicin and cyclophosphamide, followed by 4 of 12 planned weekly doses of paclitaxel given adjuvantly, discontinued secondary to peripheral neuropathy.      3. Status post radiation to the right chest wall, truncated due to problems with the patient missing appointments.  4. Tamoxifen started June of 2012, but discontinued due to nausea.   5. metastatic disease to liver and bone pathologically documented October of 2012, estrogen receptor 100% positive, progesterone receptor 7% positive, with no HER-2 amplification;  treatment in the metastatic setting has consisted of:  6.Faslodex started 03/16/2011, discontinued November 2013 with progression. 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 Status post thoracic laminectomy, T9-T11, for impending cord compression on 03/21/2011, followed by irradiation to the spine completed November 2011  8. 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 s/p palliative radiation to the Right hip clivus to 30 Gy completed 04/07/2012  10.  Progression noted by scans in November 2013, at which time Faslodex was discontinued.  Patient was started on exemestane in early December, with everolimus started in mid December at 5 mg daily. Discontinued 11/07/2012 with evidence of progression. Continues to receive Zoladex injections every 3 months, last given 09/05/2012.  11. Nondisplaced right femoral neck fracture, status post right hip arthroplasty on 05/04/2012 under the care of Dr[facility]  12. Hypercalcemia with elevated serum creatinine: resolved  13.  s/p palliative radiation therapy for cord compression completed 08/15/2012  14. Poorly controlled pain.  15. Malnutrition, protein calorie, severe  PLAN:   I discussed with Kortne that the treatments she has received are no longer working and that at this point my recommendation is to switch to a comfort care mode, specifically a focus on pain control and 24/7 support. My recommendation is transfer to [facility] and I discussed that with the patient and her "auntie." The "auntie" will be contacting th rest of the patient's family (who she says are very angry with the patient because of the drug abuse history) and arrange for a family meeting with me at some point to bring everybody up to date.  At this point I will increase the morphine (2 mg/ hr is not holdinh her), intensify the bowel prophylaxis, and place a consult for [facility]. I will be glad to take this patinet on to my service at the hospitalist's discretion. Lowella Dell, MD

## 2012-11-07 NOTE — ED Notes (Signed)
Dr. Darnelle Catalan in the room with the patient.

## 2012-11-07 NOTE — ED Notes (Signed)
Family Contact number- Rhodia Albright  (home) [phone]

## 2012-11-07 NOTE — Progress Notes (Signed)
Pt arrived to [facility] in a wheelchair.  Nurse tech asked for me to assess pt.  Pt in wheelchair, unable to sit still and moaning stating she was in really bad pain.  Family member relayed she is in so much pain and needs to be admitted to get pain under control.  Informed pt and family that Dr. Darnelle Catalan was not in at this time and she should go to the ED.  I called ED to inform them that the pt was on her way.

## 2012-11-07 NOTE — ED Provider Notes (Addendum)
History     CSN: [id number]  Arrival date & time 11/07/12  8295   First MD Initiated Contact with Patient 11/07/12 [phone]      Chief Complaint  Patient presents with  . cancer patient   . generalized weakness    (Consider location/radiation/quality/duration/timing/severity/associated sxs/prior treatment) HPI Complains of diffuse aches, headache abdominal pain and diffuse arthralgias and myalgias onset 5 days ago accompanied by multiple episodes of vomiting and generalized weakness. Last vomited possibly 4 AM today last bowel movement approximately 5 AM today patient has been constipated she disimpacted herself digitally. Patient seen here 11/05/2012 for similar complaints. Discharged home. She's been treated herself with oxycodone fentanyl patches, without adequate pain relief. No fever no other complaint no other associated symptoms pain is severe and constant and diffuse nothing makes symptoms better or worse Past Medical History  Diagnosis Date  . Hypertension   . GERD (gastroesophageal reflux disease)   . 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 Pyelonephritis 10/01/11  . Recurrent UTI (urinary tract infection) 10/01/11  . S/P chemotherapy, time since 4-12 weeks   . S/P radiation > 12 weeks 03/15/12- 04/07/12    right hip, clivus  . 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 Brain cancer     spread from breast ca  . Hypercalcemia 06/21/2012  . History of radiation therapy 08/06/10 -09/16/10    R supraclav fossa, R chest wall, R drain site  . radiaiton therapy 07/22/12 -08/15/12    T4-T7    Past Surgical History  Procedure Laterality Date  . Cesarean section  1989; 2010  . Mastectomy  2011    right  . Back surgery  2012    "removed tumor,cancer, from my spine  . Port-a-cath removal  2012    right chest  . Portacath placement  2011    right  . Tubal ligation  2010  . Breast biopsy  2011    right  . Hip arthroplasty  05/04/2012    Procedure:  ARTHROPLASTY BIPOLAR HIP;  Surgeon: Shelda Pal, MD;  Location: [facility];  Service: Orthopedics;  Laterality: Right;  RIGHT HIP HEMI ARTHROPLASTY     Family History  Problem Relation Age of Onset  . Cancer Brother 52    died of colon cancer at age of 60  . High blood pressure Mother     History  Substance Use Topics  . Smoking status: Former Smoker -- 0.25 packs/day for 31 years    Types: Cigarettes    Quit date: 04/26/2012  . Smokeless tobacco: Never Used  . Alcohol Use: No    OB History   Grav Para Term Preterm Abortions TAB SAB Ect Mult Living                  Review of Systems  Constitutional: Negative.   Respiratory: Negative.   Cardiovascular: Negative.   Gastrointestinal: Positive for nausea, vomiting, abdominal pain and constipation.  Musculoskeletal: Positive for myalgias and arthralgias.  Skin: Negative.   Allergic/Immunologic: Positive for immunocompromised state.  Neurological: Positive for headaches.  Psychiatric/Behavioral: Negative.   All other systems reviewed and are negative.    Allergies  Review of patient's allergies indicates no known allergies.  Home Medications   Current Outpatient Rx  Name  Route  Sig  Dispense  Refill  . bisacodyl (DULCOLAX) 10 MG suppository   Rectal   Place 1 suppository (10 mg total) rectally daily  as needed.   12 suppository   0   . cyclobenzaprine (FLEXERIL) 10 MG tablet   Oral   Take 10 mg by mouth 3 (three) times daily as needed for muscle spasms.         [name] dexamethasone (DECADRON) 4 MG tablet   Oral   Take 1 tablet (4 mg total) by mouth daily with breakfast.   30 tablet   4   . docusate sodium 100 MG CAPS   Oral   Take 200 mg by mouth 2 (two) times daily.   10 capsule   0   . emollient (BIAFINE) cream   Topical   Apply 1 application topically 2 (two) times daily. Apply  Cream Biafine  To mid back down to sacrum twice daily starting today after radiation treatment, for 1-2 weeks to prevent  desqaumation         . everolimus (AFINITOR) 5 MG tablet   Oral   Take 5 mg by mouth daily.         [name] exemestane (AROMASIN) 25 MG tablet   Oral   Take 25 mg by mouth daily after breakfast.         . fentaNYL (DURAGESIC - DOSED MCG/HR) 75 MCG/HR   Transdermal   Place 2 patches (150 mcg total) onto the skin every 3 (three) days.   10 patch   0   . ferrous sulfate 325 (65 FE) MG tablet   Oral   Take 325 mg by mouth 3 (three) times daily after meals.         . fluconazole (DIFLUCAN) 100 MG tablet   Oral   Take 100 mg by mouth daily.         [name] oxyCODONE-acetaminophen (PERCOCET) 10-325 MG per tablet      Take 1 or 2 tablets every 6 hours as needed for pain   20 tablet   0   . oxyCODONE-acetaminophen (PERCOCET) 10-325 MG per tablet   Oral   Take 1-2 tablets by mouth every 6 (six) hours as needed for pain.   54 tablet   0   . pantoprazole (PROTONIX) 40 MG tablet   Oral   Take 40 mg by mouth daily.         . prochlorperazine (COMPAZINE) 10 MG tablet   Oral   Take 1 tablet (10 mg total) by mouth every 6 (six) hours as needed (nausea or vomiting).   30 tablet   3   . zolpidem (AMBIEN) 5 MG tablet   Oral   Take 5 mg by mouth at bedtime as needed for sleep.           BP 164/82  Pulse 105  Temp(Src) 98.2 F (36.8 C) (Oral)  Resp 18  SpO2 100%  Physical Exam  Nursing note and vitals reviewed. Constitutional: She is oriented to person, place, and time.  Cachectic chronically ill-appearing appears uncomfortable  HENT:  Head: Normocephalic and atraumatic.  Mucous membranes dry  Eyes: Conjunctivae are normal. Pupils are equal, round, and reactive to light.  Neck: Neck supple. No tracheal deviation present. No thyromegaly present.  Cardiovascular: Normal rate and regular rhythm.   No murmur heard. Pulmonary/Chest: Effort normal and breath sounds normal.  Abdominal: Soft. Bowel sounds are normal. She exhibits no distension. There is tenderness.   Diffuse tenderness, hepatomegaly  Musculoskeletal: Normal range of motion. She exhibits no edema and no tenderness.  Neurological: She is alert and oriented to person, place, and time. Coordination  normal.  Skin: Skin is warm and dry. No rash noted.  Psychiatric: She has a normal mood and affect.    ED Course  Procedures (including critical care time)  Labs Reviewed - No data to display No results found. Results for orders placed during the hospital encounter of 11/07/12  COMPREHENSIVE METABOLIC PANEL      Result Value Range   Sodium 137  135 - 145 mEq/L   Potassium 4.1  3.5 - 5.1 mEq/L   Chloride 97  96 - 112 mEq/L   CO2 24  19 - 32 mEq/L   Glucose, Bld 89  70 - 99 mg/dL   BUN 10  6 - 23 mg/dL   Creatinine, Ser 4.54  0.50 - 1.10 mg/dL   Calcium 9.7  8.4 - 09.8 mg/dL   Total Protein 8.1  6.0 - 8.3 g/dL   Albumin 3.0 (*) 3.5 - 5.2 g/dL   AST 80 (*) 0 - 37 U/L   ALT 24  0 - 35 U/L   Alkaline Phosphatase 1043 (*) 39 - 117 U/L   Total Bilirubin 0.7  0.3 - 1.2 mg/dL   GFR calc non Af Amer >90  >90 mL/min   GFR calc Af Amer >90  >90 mL/min  CBC      Result Value Range   WBC 5.1  4.0 - 10.5 K/uL   RBC 3.51 (*) 3.87 - 5.11 MIL/uL   Hemoglobin 9.2 (*) 12.0 - 15.0 g/dL   HCT 11.9 (*) 14.7 - 82.9 %   MCV 82.3  78.0 - 100.0 fL   MCH 26.2  26.0 - 34.0 pg   MCHC 31.8  30.0 - 36.0 g/dL   RDW 56.2 (*) 13.0 - 86.5 %   Platelets 425 (*) 150 - 400 K/uL   Dg Pelvis 1-2 Views  10/16/2012   *RADIOLOGY REPORT*  Clinical Data: Worsening left groin and hip pain.  PELVIS - 1-2 VIEW  Comparison: 09/27/2012  Findings: Right hip prosthesis remains in appropriate position.  No evidence of pelvic fracture or diastasis.  No other pelvic bone abnormality identified.  Small calcified fibroids again seen in the central pelvis.  IMPRESSION:  1.  No acute findings. 2.  Right hip prosthesis in appropriate position. 3.  Small calcified fibroids.   Original Report Authenticated By: Myles Rosenthal, M.D.   Mr  Thoracic Spine W Wo Contrast  10/27/2012   *RADIOLOGY REPORT*  Clinical Data:  Metastatic breast cancer status post radiation therapy.  Severe low back pain with worsening left leg pain.  MRI THORACIC AND LUMBAR SPINE WITHOUT AND WITH CONTRAST  Technique:  Multiplanar and multiecho pulse sequences of the thoracic and lumbar spine were obtained without and with intravenous contrast.  Contrast: 9mL MULTIHANCE GADOBENATE DIMEGLUMINE 529 MG/ML IV SOLN  Comparison:  CTs of the chest, abdomen and pelvis 08/04/2012. Thoracic MRI 07/21/2012.  Lumbar MRI 03/13/2011.  MRI THORACIC SPINE  Findings: Diffuse metastatic disease to the cervicothoracic spine and multiple ribs is again noted.  No pathologic fractures are identified.  There are stable postsurgical changes status post T9- T11 posterior decompression.  No significant epidural disease is identified within the thoracic spine.  There is disease at the thoracolumbar junction, further described on the lumbar spine report below.  The thoracic cord is normal in signal and caliber.  There is no abnormal intradural enhancement.  A right pleural effusion and multifocal hepatic metastatic disease are noted.  IMPRESSION:  1.  No significant change in multifocal osseous metastases.  2.  No pathologic fracture or recurrent epidural tumor is identified within the thoracic spine.  See lumbar spine findings below. 3.  Hepatic metastatic disease.  MRI LUMBAR SPINE  Findings: Multifocal metastases are demonstrated within the lumbar spine, upper sacrum and iliac bones bilaterally.  These have progressed from the 2012 MRI, but are similar to the more recent abdominal pelvic CT.  The spinal involvement is greatest at L1 and L2, although there is also significant involvement within the L2 vertebral body and its right pedicle, the right L5 pedicle, in the upper and mid sacrum. No pathologic fractures are identified.  Epidural tumor associated with the L1 vertebral body and its right pedicle  is asymmetric to the right, extends into the right L1-L2 foramen and moderately compresses the thecal sac. The associated mass effect on the thecal sac has mildly worsened since the CT of 10 weeks ago.  There is significant epidural tumor bilaterally at L3 with resulting moderate compression of the thecal sac and right greater than left L3-L4 neural foraminal extension.  This appears similar to the prior CT.  A small amount of epidural tumor is noted laterally in the right L5-S1 foramen related to tumor in the right L5 pedicle.  There is also some epidural tumor associated with an expansile metastasis within the third sacral segment.  No abnormal intradural enhancement is identified.  There are no paraspinal masses.  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  IMPRESSION:  1.  Slowly progressive widespread metastatic disease to the lumbar spine, sacrum and iliac bones as described.  No pathologic fractures identified. 2.  There is associated epidural tumor, especially on the right at L1-L2 and bilaterally at L3-L4. At both levels, there is deformity of the thecal sac and neural foraminal extension asymmetric to the right.  There is some epidural tumor on the left at L3 which could account for the patient's symptoms.  The epidural disease appears only slightly progressive compared with the abdominal CT of 10 weeks prior.   Original Report Authenticated By: Carey Bullocks, M.D.   Mr Lumbar Spine W Wo Contrast  10/27/2012   *RADIOLOGY REPORT*  Clinical Data:  Metastatic breast cancer status post radiation therapy.  Severe low back pain with worsening left leg pain.  MRI THORACIC AND LUMBAR SPINE WITHOUT AND WITH CONTRAST  Technique:  Multiplanar and multiecho pulse sequences of the thoracic and lumbar spine were obtained without and with intravenous contrast.  Contrast: 9mL MULTIHANCE GADOBENATE DIMEGLUMINE 529 MG/ML IV SOLN  Comparison:  CTs of the chest, abdomen and pelvis 08/04/2012. Thoracic MRI 07/21/2012.   Lumbar MRI 03/13/2011.  MRI THORACIC SPINE  Findings: Diffuse metastatic disease to the cervicothoracic spine and multiple ribs is again noted.  No pathologic fractures are identified.  There are stable postsurgical changes status post T9- T11 posterior decompression.  No significant epidural disease is identified within the thoracic spine.  There is disease at the thoracolumbar junction, further described on the lumbar spine report below.  The thoracic cord is normal in signal and caliber.  There is no abnormal intradural enhancement.  A right pleural effusion and multifocal hepatic metastatic disease are noted.  IMPRESSION:  1.  No significant change in multifocal osseous metastases. 2.  No pathologic fracture or recurrent epidural tumor is identified within the thoracic spine.  See lumbar spine findings below. 3.  Hepatic metastatic disease.  MRI LUMBAR SPINE  Findings: Multifocal metastases are demonstrated within the lumbar spine, upper sacrum and iliac bones bilaterally.  These have  progressed from the 2012 MRI, but are similar to the more recent abdominal pelvic CT.  The spinal involvement is greatest at L1 and L2, although there is also significant involvement within the L2 vertebral body and its right pedicle, the right L5 pedicle, in the upper and mid sacrum. No pathologic fractures are identified.  Epidural tumor associated with the L1 vertebral body and its right pedicle is asymmetric to the right, extends into the right L1-L2 foramen and moderately compresses the thecal sac. The associated mass effect on the thecal sac has mildly worsened since the CT of 10 weeks ago.  There is significant epidural tumor bilaterally at L3 with resulting moderate compression of the thecal sac and right greater than left L3-L4 neural foraminal extension.  This appears similar to the prior CT.  A small amount of epidural tumor is noted laterally in the right L5-S1 foramen related to tumor in the right L5 pedicle.  There is  also some epidural tumor associated with an expansile metastasis within the third sacral segment.  No abnormal intradural enhancement is identified.  There are no paraspinal masses.  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  IMPRESSION:  1.  Slowly progressive widespread metastatic disease to the lumbar spine, sacrum and iliac bones as described.  No pathologic fractures identified. 2.  There is associated epidural tumor, especially on the right at L1-L2 and bilaterally at L3-L4. At both levels, there is deformity of the thecal sac and neural foraminal extension asymmetric to the right.  There is some epidural tumor on the left at L3 which could account for the patient's symptoms.  The epidural disease appears only slightly progressive compared with the abdominal CT of 10 weeks prior.   Original Report Authenticated By: Carey Bullocks, M.D.   Mr Liver W Wo Contrast  10/28/2012   *RADIOLOGY REPORT*  Clinical Data:  History breast cancer with widespread metastatic disease.  Follow-up evaluation of liver metastases.  MRI ABDOMEN WITHOUT AND WITH CONTRAST  Technique:  Multiplanar multisequence MR imaging of the abdomen was performed both before and after the administration of intravenous contrast.  Contrast: 9mL MULTIHANCE GADOBENATE DIMEGLUMINE 529 MG/ML IV SOLN  Comparison:  Multiple priors, most recently a CT of the chest abdomen and pelvis 08/04/2012.  Findings:  Again noted are innumerable hepatic nodules and masses. These are heterogeneous in signal intensity on all pulse sequences, including high signal intensity areas on pre gadolinium T1-weighted images, compatible with areas of intratumoral hemorrhage and/or highly proteinaceous content.  Post-gadolinium images demonstrates predominately peripheral enhancement which is progressive over time and slowly increasing on more delayed sequences.  The largest single mass is located in the central aspect of the liver measuring up to 12.3 x 10.7 x 11.9 cm (image 33  of series 13), slightly larger than the prior examination from 08/04/2012.  Previously noted lesion in the inferior aspect of segment 4B of the liver (image 76 of series 13) has also increased, currently measuring 7.0 x 6.3 cm (previously 6.0 x 5.9 cm).  No definite new hepatic lesions are noted.  Parenchymal thinning is again noted in the lateral aspect of the left kidney, compatible with some mild chronic scarring.  The appearance of the gallbladder, pancreas, spleen, bilateral adrenal glands and right kidney is unremarkable.  Small bilateral pleural effusions (right greater than left) layering dependently. Diffusely heterogeneous appearance of the visualized axial and appendicular skeleton with multiple enhancing lesions, compatible with widespread metastatic disease to the bones.  This includes a large expansile lesion  in the posterior aspect of the right 12th rib with probable pathologic fracture (image 42 of series 1101). Additionally, at L1-L2 and L3-L4 there is a large amount of enhancing epidural soft tissue.  IMPRESSION: 1.  Findings, as above, compatible with progressive metastatic disease to the liver with enlarging hepatic nodules and masses, as above.  No definite new hepatic lesions are identified. 2.  Widespread metastatic disease to the bones redemonstrated. This includes a pathologic fracture of the posterior aspect of the right 12th rib, and epidural extension of tumor at the level of L1-L2 and L3-L4. 3.  Small bilateral pleural effusions (right greater than left), similar to the prior examination.   Original Report Authenticated By: Trudie Reed, M.D.   Dg Abd Acute W/chest  11/07/2012   *RADIOLOGY REPORT*  Clinical Data: Metastatic cancer.  Pain and vomiting.  ACUTE ABDOMEN SERIES (ABDOMEN 2 VIEW & CHEST 1 VIEW)  Comparison: MRI 10/28/2012.  CT 08/04/2012.  Findings: There is increased soft tissue density in the upper and mid abdomen consistent with organomegaly due to massive metastatic  disease.  Intestinal gas is displaced caudally.  No sign of bowel obstruction.  No free air.  Bony metastatic disease remains evident.  One-view chest shows the heart at the upper limits of normal in size.  No free air seen under the diaphragm.  Bony metastatic disease remains evident.  Lungs are clear.  IMPRESSION: Increased soft tissue density in the upper abdomen consistent with organomegaly/massive metastatic disease.  Displacement of the bowel caudally.  No sign of obstruction or free air however.   Original Report Authenticated By: Paulina Fusi, M.D.    xrays viewed by me  No diagnosis found. X-rays reviewed by me 11:10 AM pain improved after treatment with 2 doses of IV hydromorphone. Requesting more pain medicine. Additional hydromorphone ordered.Spoke with Dr. Darnelle Catalan who will consult on case  MDM  Spoke with Dr. Darnelle Catalan plan admit medical surgical floor. Plan IV hydration, pain control, oncology consult Diagnosis intractable pain #2 metastatic cancer #3 anemia       Doug Sou, MD 11/07/12 1116  Doug Sou, MD 11/07/12 1121

## 2012-11-07 NOTE — ED Notes (Signed)
Attempted to call report to receiving nurse, but will return call when she comes out of another patient's room.

## 2012-11-08 ENCOUNTER — Telehealth: Payer: Self-pay | Admitting: Dietician

## 2012-11-08 DIAGNOSIS — K5900 Constipation, unspecified: Secondary | ICD-10-CM

## 2012-11-08 DIAGNOSIS — R109 Unspecified abdominal pain: Secondary | ICD-10-CM

## 2012-11-08 DIAGNOSIS — C50919 Malignant neoplasm of unspecified site of unspecified female breast: Secondary | ICD-10-CM

## 2012-11-08 DIAGNOSIS — M549 Dorsalgia, unspecified: Secondary | ICD-10-CM

## 2012-11-08 DIAGNOSIS — C787 Secondary malignant neoplasm of liver and intrahepatic bile duct: Secondary | ICD-10-CM

## 2012-11-08 MED ORDER — ENSURE COMPLETE PO LIQD
237.0000 mL | Freq: Four times a day (QID) | ORAL | Status: DC
  Administered 2012-11-08 – 2012-11-09 (×4): 237 mL via ORAL

## 2012-11-08 MED ORDER — HYDROMORPHONE HCL PF 1 MG/ML IJ SOLN
1.0000 mg | INTRAMUSCULAR | Status: DC | PRN
  Administered 2012-11-08 – 2012-11-09 (×7): 1 mg via INTRAVENOUS
  Filled 2012-11-08 (×8): qty 1

## 2012-11-08 MED ORDER — BISACODYL 10 MG RE SUPP
10.0000 mg | Freq: Two times a day (BID) | RECTAL | Status: DC
  Administered 2012-11-08 – 2012-11-09 (×2): 10 mg via RECTAL
  Filled 2012-11-08 (×3): qty 1

## 2012-11-08 MED ORDER — MAGNESIUM HYDROXIDE 400 MG/5ML PO SUSP
30.0000 mL | Freq: Two times a day (BID) | ORAL | Status: DC
  Administered 2012-11-08 – 2012-11-09 (×3): 30 mL via ORAL
  Filled 2012-11-08 (×3): qty 30

## 2012-11-08 MED ORDER — METHYLNALTREXONE BROMIDE 12 MG/0.6ML ~~LOC~~ SOLN
8.0000 mg | SUBCUTANEOUS | Status: DC
  Filled 2012-11-08: qty 0.6

## 2012-11-08 NOTE — Consult Note (Signed)
[facility] Liaison: Received request from CSW for patient/family interest in [facility] Met with patient and sister Greenland earlier this morning to complete transfer paperwork. Room will be available either tomorrow or Thursday. Will let Tehila and staff know as soon as we know. Risa requested I return this afternoon at 1:30 to answer sister Cameo's questions. Cameo has not arrived at this time. Left my contact information for her to call if she still has questions. Tiana very alert earlier this morning, asked many questions in presence of Greenland. Both can likely answer questions but I am happy to return when Cameo arrives if still in hospital. Dr. Darnelle Catalan to assume care at [facility]. Will need discharge summary faxed on day of transfer to [phone] and RN to call report at [phone]. Thank you. Forrestine Him LCSW [phone]

## 2012-11-08 NOTE — Progress Notes (Signed)
[name]   DOB:1966/02/24   ZO#:[id number]   JWJ#:[id number]  Subjective: [name] is much more comfortable today. "This minute" she has no pain. She is anxious about the PRN and wants to make sure those are not d/c'd--explained the plan is to continue what is working. No BM today. Not getting OOB. She is looking forward to [employer]. No family in room   Objective: middle aged African American woman examined in bed Filed Vitals:   11/08/12 0518  BP: 126/78  Pulse: 109  Temp: 99.8 F (37.7 C)  Resp: 18    Body mass index is 19.24 kg/(m^2).  Intake/Output Summary (Last 24 hours) at 11/08/12 1317 Last data filed at 11/08/12 0600  Gross per 24 hour  Intake    825 ml  Output      0 ml  Net    825 ml     Sclerae unicteric  Oropharynx clear, slightly dry  No peripheral adenopathy  Lungs clear -- no rales or rhonchi  Heart regular rate and rhythm  Abdomen distended, massive hepatomegaly, +BS  MSK minimal peripheral edema  Neuro nonfocal, well-oriented, cooperative affect  Breast exam: deferred  CBG (last 3)  No results found for this basename: GLUCAP,  in the last 72 hours   Labs:  Lab Results  Component Value Date   WBC 5.1 11/07/2012   HGB 9.2* 11/07/2012   HCT 28.9* 11/07/2012   MCV 82.3 11/07/2012   PLT 425* 11/07/2012   NEUTROABS 7.2* 10/31/2012    @LASTCHEMISTRY @  Urine Studies No results found for this basename: UACOL, UAPR, USPG, UPH, UTP, UGL, UKET, UBIL, UHGB, UNIT, UROB, ULEU, UEPI, UWBC, URBC, UBAC, CAST, CRYS, UCOM, BILUA,  in the last 72 hours  Basic Metabolic Panel:  Recent Labs Lab 11/07/12 0905  NA 137  K 4.1  CL 97  CO2 24  GLUCOSE 89  BUN 10  CREATININE 0.55  CALCIUM 9.7   GFR Estimated Creatinine Clearance: 65.5 ml/min (by C-G formula based on Cr of 0.55). Liver Function Tests:  Recent Labs Lab 11/07/12 0905  AST 80*  ALT 24  ALKPHOS 1043*  BILITOT 0.7  PROT 8.1  ALBUMIN 3.0*   No results found for this basename: LIPASE, AMYLASE,   in the last 168 hours No results found for this basename: AMMONIA,  in the last 168 hours Coagulation profile No results found for this basename: INR, PROTIME,  in the last 168 hours  CBC:  Recent Labs Lab 11/07/12 0905  WBC 5.1  HGB 9.2*  HCT 28.9*  MCV 82.3  PLT 425*   Cardiac Enzymes: No results found for this basename: CKTOTAL, CKMB, CKMBINDEX, TROPONINI,  in the last 168 hours BNP: No components found with this basename: POCBNP,  CBG: No results found for this basename: GLUCAP,  in the last 168 hours D-Dimer No results found for this basename: DDIMER,  in the last 72 hours Hgb A1c No results found for this basename: HGBA1C,  in the last 72 hours Lipid Profile No results found for this basename: CHOL, HDL, LDLCALC, TRIG, CHOLHDL, LDLDIRECT,  in the last 72 hours Thyroid function studies No results found for this basename: TSH, T4TOTAL, FREET3, T3FREE, THYROIDAB,  in the last 72 hours Anemia work up No results found for this basename: VITAMINB12, FOLATE, FERRITIN, TIBC, IRON, RETICCTPCT,  in the last 72 hours Microbiology No results found for this or any previous visit (from the past 240 hour(s)).    Studies:  Dg Abd Acute W/chest  11/07/2012   *RADIOLOGY REPORT*  Clinical Data: Metastatic cancer.  Pain and vomiting.  ACUTE ABDOMEN SERIES (ABDOMEN 2 VIEW & CHEST 1 VIEW)  Comparison: MRI 10/28/2012.  CT 08/04/2012.  Findings: There is increased soft tissue density in the upper and mid abdomen consistent with organomegaly due to massive metastatic disease.  Intestinal gas is displaced caudally.  No sign of bowel obstruction.  No free air.  Bony metastatic disease remains evident.  One-view chest shows the heart at the upper limits of normal in size.  No free air seen under the diaphragm.  Bony metastatic disease remains evident.  Lungs are clear.  IMPRESSION: Increased soft tissue density in the upper abdomen consistent with organomegaly/massive metastatic disease.   Displacement of the bowel caudally.  No sign of obstruction or free air however.   Original Report Authenticated By: Andrea Fusi, M.D.    Assessment: 47 y.o. [name] woman  1. 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  2.Status post adjuvant chemotherapy July to November 2011, consisting of 4 cycles of dose dense doxorubicin and cyclophosphamide, followed by 4 of 12 planned weekly doses of paclitaxel given adjuvantly, discontinued secondary to peripheral neuropathy.  3. Status post radiation to the right chest wall, truncated due to problems with the patient missing appointments.  4. Tamoxifen started June of 2012, but discontinued due to nausea.  5. metastatic disease to liver and bone pathologically documented October of 2012, estrogen receptor 100% positive, progesterone receptor 7% positive, with no HER-2 amplification; treatment in the metastatic setting has consisted of:  6.Faslodex started 03/16/2011, discontinued November 2013 with progression. 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 Status post thoracic laminectomy, T9-T11, for impending cord compression on 03/21/2011, followed by irradiation to the spine completed November 2011  8. 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 s/p palliative radiation to the Right hip clivus to 30 Gy completed 04/07/2012  10. Progression noted by scans in November 2013, at which time Faslodex was discontinued. Patient was started on exemestane in  early December, with everolimus started in mid December at 5 mg daily. Discontinued 11/07/2012 with evidence of progression. Also received Zoladex injections every 3 months, last given 09/05/2012.  11. Nondisplaced right femoral neck fracture, status post right hip arthroplasty on 05/04/2012 under the care of Dr.Matt [facility].  12. Hypercalcemia with elevated serum creatinine: resolved  13. s/p palliative radiation therapy for cord compression completed 08/15/2012  14. Poorly controlled pain.  15. Malnutrition, protein calorie, severe   Plan:  Karry is much more comfortable and is alert on current medications. I would continue them as written. She has a good understanding of her current situation. I believe she will do well at [facility], where her pain can be controlled and her family can be supported. She has a good bowel prophylaxis regimen in place. --  I go by [facility] every Friday, so will see her there after discharge. Please let me know if I can be of further help.  Lowella Dell, MD 11/08/2012  1:17 PM

## 2012-11-08 NOTE — Progress Notes (Signed)
[facility]                                                                                Patient Demographics  [name], is a 47 y.o. female, DOB - 1966-03-11, ZOX:[id number], WJX:[id number]  Admit date - 11/07/2012  Admitting Physician Maryruth Bun Rama, MD  Outpatient Primary MD for the patient is Pcp Not In System  LOS - 1   Chief Complaint  Patient presents with  . cancer patient   . generalized weakness        Assessment & Plan    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, brain and bone / pain related to malignancy  -Continue Decadron. Discontinue Duragesic patch and Percocet and placed on a continuous morphine drip at 2 mg per hour with 4 mg boluses q. one hour as needed for breakthrough pain. Pain management regimen discussed with the patient's oncologist who agrees with this plan of care.  -Continue Flexeril as needed for muscle spasms.  -Continue Afinitor and Aromasin for now.  -Have noted suggestions, primary oncologist and have consulted palliative care to initiate hospice arrangements. Her malignancy seems to have failed all treatments per her oncologist.       Severe protein calorie malnutrition  -Secondary cancer related cachexia. Ensure supplements and a dietitian consultation ordered.     GERD (gastroesophageal reflux disease)  -Continue PPI therapy.     Hypercalcemia  -Hydrate, Corrected calcium is 10.5.     Constipation with some generalized abdominal pain and distention   she has not noted bowel, KUB shows no obstruction, have added Crestor along with Colace, MiraLAX and milk of magnesia.     Normocytic anemia  -Continue iron supplementation.       Alkaline phosphatase elevation  -Secondary to liver and bone metastasis.     Code Status: DNR  Family Communication: None present  Disposition Plan: Likely home hospice versus residential hospice   Procedures     Consults  oncology and palliative care   DVT Prophylaxis     Heparin   Lab Results  Component Value Date   PLT 425* 11/07/2012    Medications  Scheduled Meds: . sodium chloride   Intravenous STAT  . bisacodyl  10 mg Rectal BID  . dexamethasone  4 mg Oral Q breakfast  . docusate sodium  200 mg Oral BID  . feeding supplement  237 mL Oral BID BM  . heparin  5,000 Units Subcutaneous Q8H  . magnesium hydroxide  30 mL Oral BID  . methylnaltrexone  8 mg Subcutaneous QODAY  . pantoprazole  40 mg Oral Daily  . polyethylene glycol  17 g Oral Daily  . sodium chloride  1,000 mL Intravenous Once   Continuous Infusions: . sodium chloride 75 mL/hr at 11/07/12 1900  . morphine 5 mg/hr (11/07/12 1451)   PRN Meds:.alum & mag hydroxide-simeth, morphine, ondansetron, zolpidem  Antibiotics     Anti-infectives   Start     Dose/Rate Route Frequency Ordered Stop   11/07/12 1500  fluconazole (DIFLUCAN) tablet 100 mg  Status:  Discontinued     100 mg Oral Daily 11/07/12 1314 11/07/12 1434       Time Spent in  minutes   35   SINGH,PRASHANT K M.D on 11/08/2012 at 8:39 AM  Between 7am to 7pm - Pager - [phone]  After 7pm go to [email or web address] - password [email or web address]  And look for the night coverage person covering for me after hours  [facility] Office  [phone]    Subjective:   [name] today has, No headache, No chest pain, mild generalized abdominal pain - No Nausea, No new weakness tingling or numbness, No Cough - SOB. Feels constipated and bloated.  Objective:   Filed Vitals:   11/07/12 1200 11/07/12 1256 11/07/12 2108 11/08/12 0518  BP: 157/91 159/86 159/83 126/78  Pulse: 114 112 108 109  Temp:  98.9 F (37.2 C) 99.1 F (37.3 C) 99.8 F (37.7 C)  TempSrc:  Oral Oral Oral  Resp:   18 18  Height:  5\' 2"  (1.575 m)    Weight:  47.718 kg (105 lb 3.2 oz)    SpO2: 98%  94% 98%    Wt Readings from Last 3 Encounters:  11/07/12 47.718 kg (105 lb 3.2 oz)  10/25/12 48.3 kg (106 lb 7.7 oz)  10/16/12 51.256 kg (113 lb)      Intake/Output Summary (Last 24 hours) at 11/08/12 0839 Last data filed at 11/08/12 0600  Gross per 24 hour  Intake    825 ml  Output      0 ml  Net    825 ml    Exam Middle-aged cachectic African American female, Awake Alert, Oriented X 3, No new F.N deficits, Normal affect [place].AT,PERRAL Supple Neck,No JVD, No cervical lymphadenopathy appriciated.  Symmetrical Chest wall movement, Good air movement bilaterally, CTAB RRR,No Gallops,Rubs or new Murmurs, No Parasternal Heave +ve B.Sounds, Abd Soft but distended, Non tender, No organomegaly appriciated, No rebound - guarding or rigidity. No Cyanosis, Clubbing or edema, No new Rash or bruise      Data Review   Micro Results No results found for this or any previous visit (from the past 240 hour(s)).  Radiology Reports Dg Pelvis 1-2 Views  10/16/2012   *RADIOLOGY REPORT*  Clinical Data: Worsening left groin and hip pain.  PELVIS - 1-2 VIEW  Comparison: 09/27/2012  Findings: Right hip prosthesis remains in appropriate position.  No evidence of pelvic fracture or diastasis.  No other pelvic bone abnormality identified.  Small calcified fibroids again seen in the central pelvis.  IMPRESSION:  1.  No acute findings. 2.  Right hip prosthesis in appropriate position. 3.  Small calcified fibroids.   Original Report Authenticated By: Myles Rosenthal, M.D.   Mr Thoracic Spine W Wo Contrast  10/27/2012   *RADIOLOGY REPORT*  Clinical Data:  Metastatic breast cancer status post radiation therapy.  Severe low back pain with worsening left leg pain.  MRI THORACIC AND LUMBAR SPINE WITHOUT AND WITH CONTRAST  Technique:  Multiplanar and multiecho pulse sequences of the thoracic and lumbar spine were obtained without and with intravenous contrast.  Contrast: 9mL MULTIHANCE GADOBENATE DIMEGLUMINE 529 MG/ML IV SOLN  Comparison:  CTs of the chest, abdomen and pelvis 08/04/2012. Thoracic MRI 07/21/2012.  Lumbar MRI 03/13/2011.  MRI THORACIC SPINE  Findings:  Diffuse metastatic disease to the cervicothoracic spine and multiple ribs is again noted.  No pathologic fractures are identified.  There are stable postsurgical changes status post T9- T11 posterior decompression.  No significant epidural disease is identified within the thoracic spine.  There is disease at the thoracolumbar junction, further described on the lumbar spine report below.  The  thoracic cord is normal in signal and caliber.  There is no abnormal intradural enhancement.  A right pleural effusion and multifocal hepatic metastatic disease are noted.  IMPRESSION:  1.  No significant change in multifocal osseous metastases. 2.  No pathologic fracture or recurrent epidural tumor is identified within the thoracic spine.  See lumbar spine findings below. 3.  Hepatic metastatic disease.  MRI LUMBAR SPINE  Findings: Multifocal metastases are demonstrated within the lumbar spine, upper sacrum and iliac bones bilaterally.  These have progressed from the 2012 MRI, but are similar to the more recent abdominal pelvic CT.  The spinal involvement is greatest at L1 and L2, although there is also significant involvement within the L2 vertebral body and its right pedicle, the right L5 pedicle, in the upper and mid sacrum. No pathologic fractures are identified.  Epidural tumor associated with the L1 vertebral body and its right pedicle is asymmetric to the right, extends into the right L1-L2 foramen and moderately compresses the thecal sac. The associated mass effect on the thecal sac has mildly worsened since the CT of 10 weeks ago.  There is significant epidural tumor bilaterally at L3 with resulting moderate compression of the thecal sac and right greater than left L3-L4 neural foraminal extension.  This appears similar to the prior CT.  A small amount of epidural tumor is noted laterally in the right L5-S1 foramen related to tumor in the right L5 pedicle.  There is also some epidural tumor associated with an expansile  metastasis within the third sacral segment.  No abnormal intradural enhancement is identified.  There are no paraspinal masses.  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  IMPRESSION:  1.  Slowly progressive widespread metastatic disease to the lumbar spine, sacrum and iliac bones as described.  No pathologic fractures identified. 2.  There is associated epidural tumor, especially on the right at L1-L2 and bilaterally at L3-L4. At both levels, there is deformity of the thecal sac and neural foraminal extension asymmetric to the right.  There is some epidural tumor on the left at L3 which could account for the patient's symptoms.  The epidural disease appears only slightly progressive compared with the abdominal CT of 10 weeks prior.   Original Report Authenticated By: Carey Bullocks, M.D.   Mr Lumbar Spine W Wo Contrast  10/27/2012   *RADIOLOGY REPORT*  Clinical Data:  Metastatic breast cancer status post radiation therapy.  Severe low back pain with worsening left leg pain.  MRI THORACIC AND LUMBAR SPINE WITHOUT AND WITH CONTRAST  Technique:  Multiplanar and multiecho pulse sequences of the thoracic and lumbar spine were obtained without and with intravenous contrast.  Contrast: 9mL MULTIHANCE GADOBENATE DIMEGLUMINE 529 MG/ML IV SOLN  Comparison:  CTs of the chest, abdomen and pelvis 08/04/2012. Thoracic MRI 07/21/2012.  Lumbar MRI 03/13/2011.  MRI THORACIC SPINE  Findings: Diffuse metastatic disease to the cervicothoracic spine and multiple ribs is again noted.  No pathologic fractures are identified.  There are stable postsurgical changes status post T9- T11 posterior decompression.  No significant epidural disease is identified within the thoracic spine.  There is disease at the thoracolumbar junction, further described on the lumbar spine report below.  The thoracic cord is normal in signal and caliber.  There is no abnormal intradural enhancement.  A right pleural effusion and multifocal hepatic metastatic  disease are noted.  IMPRESSION:  1.  No significant change in multifocal osseous metastases. 2.  No pathologic fracture or recurrent epidural tumor is  identified within the thoracic spine.  See lumbar spine findings below. 3.  Hepatic metastatic disease.  MRI LUMBAR SPINE  Findings: Multifocal metastases are demonstrated within the lumbar spine, upper sacrum and iliac bones bilaterally.  These have progressed from the 2012 MRI, but are similar to the more recent abdominal pelvic CT.  The spinal involvement is greatest at L1 and L2, although there is also significant involvement within the L2 vertebral body and its right pedicle, the right L5 pedicle, in the upper and mid sacrum. No pathologic fractures are identified.  Epidural tumor associated with the L1 vertebral body and its right pedicle is asymmetric to the right, extends into the right L1-L2 foramen and moderately compresses the thecal sac. The associated mass effect on the thecal sac has mildly worsened since the CT of 10 weeks ago.  There is significant epidural tumor bilaterally at L3 with resulting moderate compression of the thecal sac and right greater than left L3-L4 neural foraminal extension.  This appears similar to the prior CT.  A small amount of epidural tumor is noted laterally in the right L5-S1 foramen related to tumor in the right L5 pedicle.  There is also some epidural tumor associated with an expansile metastasis within the third sacral segment.  No abnormal intradural enhancement is identified.  There are no paraspinal masses.  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  IMPRESSION:  1.  Slowly progressive widespread metastatic disease to the lumbar spine, sacrum and iliac bones as described.  No pathologic fractures identified. 2.  There is associated epidural tumor, especially on the right at L1-L2 and bilaterally at L3-L4. At both levels, there is deformity of the thecal sac and neural foraminal extension asymmetric to the right.   There is some epidural tumor on the left at L3 which could account for the patient's symptoms.  The epidural disease appears only slightly progressive compared with the abdominal CT of 10 weeks prior.   Original Report Authenticated By: Carey Bullocks, M.D.   Mr Liver W Wo Contrast  10/28/2012   *RADIOLOGY REPORT*  Clinical Data:  History breast cancer with widespread metastatic disease.  Follow-up evaluation of liver metastases.  MRI ABDOMEN WITHOUT AND WITH CONTRAST  Technique:  Multiplanar multisequence MR imaging of the abdomen was performed both before and after the administration of intravenous contrast.  Contrast: 9mL MULTIHANCE GADOBENATE DIMEGLUMINE 529 MG/ML IV SOLN  Comparison:  Multiple priors, most recently a CT of the chest abdomen and pelvis 08/04/2012.  Findings:  Again noted are innumerable hepatic nodules and masses. These are heterogeneous in signal intensity on all pulse sequences, including high signal intensity areas on pre gadolinium T1-weighted images, compatible with areas of intratumoral hemorrhage and/or highly proteinaceous content.  Post-gadolinium images demonstrates predominately peripheral enhancement which is progressive over time and slowly increasing on more delayed sequences.  The largest single mass is located in the central aspect of the liver measuring up to 12.3 x 10.7 x 11.9 cm (image 33 of series 13), slightly larger than the prior examination from 08/04/2012.  Previously noted lesion in the inferior aspect of segment 4B of the liver (image 76 of series 13) has also increased, currently measuring 7.0 x 6.3 cm (previously 6.0 x 5.9 cm).  No definite new hepatic lesions are noted.  Parenchymal thinning is again noted in the lateral aspect of the left kidney, compatible with some mild chronic scarring.  The appearance of the gallbladder, pancreas, spleen, bilateral adrenal glands and right kidney is unremarkable.  Small  bilateral pleural effusions (right greater than left)  layering dependently. Diffusely heterogeneous appearance of the visualized axial and appendicular skeleton with multiple enhancing lesions, compatible with widespread metastatic disease to the bones.  This includes a large expansile lesion in the posterior aspect of the right 12th rib with probable pathologic fracture (image 42 of series 1101). Additionally, at L1-L2 and L3-L4 there is a large amount of enhancing epidural soft tissue.  IMPRESSION: 1.  Findings, as above, compatible with progressive metastatic disease to the liver with enlarging hepatic nodules and masses, as above.  No definite new hepatic lesions are identified. 2.  Widespread metastatic disease to the bones redemonstrated. This includes a pathologic fracture of the posterior aspect of the right 12th rib, and epidural extension of tumor at the level of L1-L2 and L3-L4. 3.  Small bilateral pleural effusions (right greater than left), similar to the prior examination.   Original Report Authenticated By: Trudie Reed, M.D.   Dg Abd Acute W/chest  11/07/2012   *RADIOLOGY REPORT*  Clinical Data: Metastatic cancer.  Pain and vomiting.  ACUTE ABDOMEN SERIES (ABDOMEN 2 VIEW & CHEST 1 VIEW)  Comparison: MRI 10/28/2012.  CT 08/04/2012.  Findings: There is increased soft tissue density in the upper and mid abdomen consistent with organomegaly due to massive metastatic disease.  Intestinal gas is displaced caudally.  No sign of bowel obstruction.  No free air.  Bony metastatic disease remains evident.  One-view chest shows the heart at the upper limits of normal in size.  No free air seen under the diaphragm.  Bony metastatic disease remains evident.  Lungs are clear.  IMPRESSION: Increased soft tissue density in the upper abdomen consistent with organomegaly/massive metastatic disease.  Displacement of the bowel caudally.  No sign of obstruction or free air however.   Original Report Authenticated By: Paulina Fusi, M.D.    CBC  Recent Labs Lab  11/07/12 0905  WBC 5.1  HGB 9.2*  HCT 28.9*  PLT 425*  MCV 82.3  MCH 26.2  MCHC 31.8  RDW 18.5*    Chemistries   Recent Labs Lab 11/07/12 0905  NA 137  K 4.1  CL 97  CO2 24  GLUCOSE 89  BUN 10  CREATININE 0.55  CALCIUM 9.7  AST 80*  ALT 24  ALKPHOS 1043*  BILITOT 0.7   ------------------------------------------------------------------------------------------------------------------ estimated creatinine clearance is 65.5 ml/min (by C-G formula based on Cr of 0.55). ------------------------------------------------------------------------------------------------------------------ No results found for this basename: HGBA1C,  in the last 72 hours ------------------------------------------------------------------------------------------------------------------ No results found for this basename: CHOL, HDL, LDLCALC, TRIG, CHOLHDL, LDLDIRECT,  in the last 72 hours ------------------------------------------------------------------------------------------------------------------ No results found for this basename: TSH, T4TOTAL, FREET3, T3FREE, THYROIDAB,  in the last 72 hours ------------------------------------------------------------------------------------------------------------------ No results found for this basename: VITAMINB12, FOLATE, FERRITIN, TIBC, IRON, RETICCTPCT,  in the last 72 hours  Coagulation profile No results found for this basename: INR, PROTIME,  in the last 168 hours  No results found for this basename: DDIMER,  in the last 72 hours  Cardiac Enzymes No results found for this basename: CK, CKMB, TROPONINI, MYOGLOBIN,  in the last 168 hours ------------------------------------------------------------------------------------------------------------------ No components found with this basename: POCBNP,

## 2012-11-08 NOTE — Progress Notes (Signed)
INITIAL NUTRITION ASSESSMENT  DOCUMENTATION CODES Per approved criteria  -Severe malnutrition in the context of chronic illness  Pt meets criteria for SEVERE MALNUTRITION in the context of Chronic illness as evidenced by 15% wt loss in less than 3 months and estimated PO intake <75% of estimated energy requirements for >3 months.   INTERVENTION: Provide Ensure Complete po QID, each supplement provides 350 kcal and 13 grams of protein. Encourage PO intake  NUTRITION DIAGNOSIS: Unintentional wt loss related to cancer/poor appetite as evidenced by 15% wt loss in less than 3 months.   Goal: Pt to meet >/= 90% of their estimated nutrition needs  Monitor:  PO intake Weight Labs  Reason for Assessment: Consult for cancer related cachexia  47 y.o. 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 female with PMH of metastatic breast cancer, chronic pain, who has been managed by Dr. Darnelle Catalan, who presents with uncontrolled pain in abdomen and legs.  Pt states she usually eats small amounts throughout the day and drinks 2 Ensure BID. Pt reports that she has had very little to eat for the past 5 days due to poor appetite but has continued drinking Ensure BID. Pt reports that he usual body weight is 125 lbs; 15% wt loss in less than 3 months. During previous admissions, pt reported poor appetite and poor PO intake for 2-3 week periods. Pt ate most of her breakfast and 1 Ensure this AM an was eating soup, peaches, and sweet tea for lunch. Pt states that she likes Ensure supplements and would like to drink 3-4 daily. Encouraged pt to continue eating 3 meals daily in addition to drinking Ensure. Discussed diet tips for constipation.  Height: Ht Readings from Last 1 Encounters:  11/07/12 5\' 2"  (1.575 m)    Weight: Wt Readings from Last 1 Encounters:  11/07/12 105 lb 3.2 oz (47.718 kg)    Ideal Body Weight: 110 lbs  % Ideal Body Weight: 95%  Wt Readings  from Last 10 Encounters:  11/07/12 105 lb 3.2 oz (47.718 kg)  10/25/12 106 lb 7.7 oz (48.3 kg)  10/16/12 113 lb (51.256 kg)  10/12/12 117 lb 8 oz (53.298 kg)  10/04/12 113 lb (51.256 kg)  09/27/12 115 lb 8 oz (52.39 kg)  09/05/12 119 lb 6.4 oz (54.159 kg)  08/22/12 124 lb 12.8 oz (56.609 kg)  08/06/12 130 lb 11.7 oz (59.3 kg)  08/09/12 129 lb 4.8 oz (58.65 kg)    Usual Body Weight: 125 lbs  % Usual Body Weight: 84%  BMI:  Body mass index is 19.24 kg/(m^2).  Estimated Nutritional Needs: Kcal: 1430-1670 Protein: 57-67 grams Fluid: 1.9 L  Skin: intact  Diet Order: General  EDUCATION NEEDS: -No education needs identified at this time   Intake/Output Summary (Last 24 hours) at 11/08/12 1325 Last data filed at 11/08/12 0600  Gross per 24 hour  Intake    825 ml  Output      0 ml  Net    825 ml    Last BM: 6/8  Labs:   Recent Labs Lab 11/07/12 0905  NA 137  K 4.1  CL 97  CO2 24  BUN 10  CREATININE 0.55  CALCIUM 9.7  GLUCOSE 89    CBG (last 3)  No results found for this basename: GLUCAP,  in the last 72 hours  Scheduled Meds: . bisacodyl  10 mg Rectal BID  . dexamethasone  4 mg Oral Q breakfast  . docusate  sodium  200 mg Oral BID  . feeding supplement  237 mL Oral BID BM  . heparin  5,000 Units Subcutaneous Q8H  . magnesium hydroxide  30 mL Oral BID  . methylnaltrexone  8 mg Subcutaneous QODAY  . pantoprazole  40 mg Oral Daily  . polyethylene glycol  17 g Oral Daily  . sodium chloride  1,000 mL Intravenous Once    Continuous Infusions: . sodium chloride 75 mL/hr at 11/07/12 1900  . morphine 5 mg/hr (11/07/12 1451)    Past Medical History  Diagnosis Date  . Hypertension   . GERD (gastroesophageal reflux disease)   . 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 Pyelonephritis 10/01/11  . Recurrent UTI (urinary tract infection) 10/01/11  . S/P chemotherapy, time since 4-12 weeks   . S/P radiation > 12 weeks 03/15/12- 04/07/12    right hip,  clivus  . 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 Brain cancer     spread from breast ca  . Hypercalcemia 06/21/2012  . History of radiation therapy 08/06/10 -09/16/10    R supraclav fossa, R chest wall, R drain site  . radiaiton therapy 07/22/12 -08/15/12    T4-T7    Past Surgical History  Procedure Laterality Date  . Cesarean section  1989; 2010  . Mastectomy  2011    right  . Back surgery  2012    "removed tumor,cancer, from my spine  . Port-a-cath removal  2012    right chest  . Portacath placement  2011    right  . Tubal ligation  2010  . Breast biopsy  2011    right  . Hip arthroplasty  05/04/2012    Procedure: ARTHROPLASTY BIPOLAR HIP;  Surgeon: Shelda Pal, MD;  Location: [facility];  Service: Orthopedics;  Laterality: Right;  RIGHT HIP HEMI ARTHROPLASTY     Ian Malkin RD, LDN Inpatient Clinical Dietitian Pager: [phone] After Hours Pager: [phone]

## 2012-11-08 NOTE — Progress Notes (Signed)
PMT consult received; spoke with Dr Thedore Mins; Dr Magrinat's note reviewed; PMT consult not necessary as goals are set, symptoms being managed by Dr Darnelle Catalan and primary team and patient being referred to residential hospice.   PMT will sign off at this time.  Please re-consult if PMT can be of assistance in the future.   Valente David, RN 11/08/2012, 10:50 AM Palliative Medicine Team RN Liaison [phone]

## 2012-11-08 NOTE — Consult Note (Signed)
[facility] Liaison: [employer] available for Ms. Zeiger tomorrow. She and sister Cameo both aware and agreeable. Sent msg to CSW [place]. Will need DC summary faxed to [phone] and RN to call report to [phone]. Thank you. Forrestine Him LCSW [phone]

## 2012-11-09 DIAGNOSIS — Z515 Encounter for palliative care: Secondary | ICD-10-CM

## 2012-11-09 DIAGNOSIS — C801 Malignant (primary) neoplasm, unspecified: Secondary | ICD-10-CM

## 2012-11-09 DIAGNOSIS — C7951 Secondary malignant neoplasm of bone: Principal | ICD-10-CM

## 2012-11-09 HISTORY — DX: Encounter for palliative care: Z51.5

## 2012-11-09 LAB — BASIC METABOLIC PANEL
CO2: 26 mEq/L (ref 19–32)
Calcium: 9.4 mg/dL (ref 8.4–10.5)
GFR calc non Af Amer: >90 mL/min (ref >90)
Glucose, Bld: 110 mg/dL — ABNORMAL HIGH (ref 70–99)
Potassium: 4.2 mEq/L (ref 3.5–5.1)
Sodium: 137 mEq/L (ref 135–145)

## 2012-11-09 MED ORDER — CYCLOBENZAPRINE HCL 10 MG PO TABS: 10.0000 mg | ORAL_TABLET | Freq: Three times a day (TID) | ORAL | Status: DC | PRN

## 2012-11-09 MED ORDER — PANTOPRAZOLE SODIUM 40 MG PO TBEC: 40.0000 mg | DELAYED_RELEASE_TABLET | Freq: Every day | ORAL | Status: DC

## 2012-11-09 MED ORDER — METHYLNALTREXONE BROMIDE 12 MG/0.6ML ~~LOC~~ SOLN: 8.0000 mg | SUBCUTANEOUS | Status: DC

## 2012-11-09 MED ORDER — ENSURE COMPLETE PO LIQD: 237.0000 mL | Freq: Four times a day (QID) | ORAL | Status: DC

## 2012-11-09 MED ORDER — EVEROLIMUS 5 MG PO TABS: 5.0000 mg | ORAL_TABLET | Freq: Every day | ORAL | Status: DC

## 2012-11-09 MED ORDER — POLYETHYLENE GLYCOL 3350 17 G PO PACK: 17.0000 g | PACK | Freq: Every day | ORAL | Status: DC

## 2012-11-09 MED ORDER — EXEMESTANE 25 MG PO TABS: 25.0000 mg | ORAL_TABLET | Freq: Every day | ORAL | Status: DC

## 2012-11-09 MED ORDER — MORPHINE SULFATE 25 MG/ML IV SOLN: 5.0000 mg/h | INTRAVENOUS | Status: DC

## 2012-11-09 NOTE — Progress Notes (Signed)
[name]   DOB:1966/01/24   ON#:[id number]   KGM#:[id number]  Subjective: [name] had a "good" BM this AM and "stomach feels much better." She ambulated only in the room, and minimally. Pain is well-controlled on current medicines. IV "keeps beeping"--likely will change to sQ at BP-- No family in room but patient requested I speak to her "little sister" and we went over the patient's prognosis and overall situation   Objective: middle aged [place] woman examined in bed Filed Vitals:   11/08/12 2158  BP: 149/89  Pulse: 100  Resp: 18    Body mass index is 19.24 kg/(m^2).  Intake/Output Summary (Last 24 hours) at 11/09/12 0750 Last data filed at 11/08/12 1850  Gross per 24 hour  Intake    915 ml  Output      0 ml  Net    915 ml     Sclerae unicteric  Oropharynx clear  No peripheral adenopathy  Lungs clear -- no rales or rhonchi  Heart regular rate and rhythm  Abdomen distended, massive hepatomegaly, +BS  MSK minimal peripheral edema  Neuro nonfocal, well-oriented, pleasant affect  Breast exam: deferred  CBG (last 3)  No results found for this basename: GLUCAP,  in the last 72 hours   Labs:  Lab Results  Component Value Date   WBC 5.1 11/07/2012   HGB 9.2* 11/07/2012   HCT 28.9* 11/07/2012   MCV 82.3 11/07/2012   PLT 425* 11/07/2012   NEUTROABS 7.2* 10/31/2012    @LASTCHEMISTRY @  Urine Studies No results found for this basename: UACOL, UAPR, USPG, UPH, UTP, UGL, UKET, UBIL, UHGB, UNIT, UROB, ULEU, UEPI, UWBC, URBC, UBAC, CAST, CRYS, UCOM, BILUA,  in the last 72 hours  Basic Metabolic Panel:  Recent Labs Lab 11/07/12 0905 11/09/12 0510  NA 137 137  K 4.1 4.2  CL 97 100  CO2 24 26  GLUCOSE 89 110*  BUN 10 9  CREATININE 0.55 0.46*  CALCIUM 9.7 9.4   GFR Estimated Creatinine Clearance: 65.5 ml/min (by C-G formula based on Cr of 0.46). Liver Function Tests:  Recent Labs Lab 11/07/12 0905  AST 80*  ALT 24  ALKPHOS 1043*  BILITOT 0.7  PROT 8.1   ALBUMIN 3.0*   No results found for this basename: LIPASE, AMYLASE,  in the last 168 hours No results found for this basename: AMMONIA,  in the last 168 hours Coagulation profile No results found for this basename: INR, PROTIME,  in the last 168 hours  CBC:  Recent Labs Lab 11/07/12 0905  WBC 5.1  HGB 9.2*  HCT 28.9*  MCV 82.3  PLT 425*   Cardiac Enzymes: No results found for this basename: CKTOTAL, CKMB, CKMBINDEX, TROPONINI,  in the last 168 hours BNP: No components found with this basename: POCBNP,  CBG: No results found for this basename: GLUCAP,  in the last 168 hours D-Dimer No results found for this basename: DDIMER,  in the last 72 hours Hgb A1c No results found for this basename: HGBA1C,  in the last 72 hours Lipid Profile No results found for this basename: CHOL, HDL, LDLCALC, TRIG, CHOLHDL, LDLDIRECT,  in the last 72 hours Thyroid function studies No results found for this basename: TSH, T4TOTAL, FREET3, T3FREE, THYROIDAB,  in the last 72 hours Anemia work up No results found for this basename: VITAMINB12, FOLATE, FERRITIN, TIBC, IRON, RETICCTPCT,  in the last 72 hours Microbiology No results found for this or any previous visit (from the past 240 hour(s)).  Studies:  Dg Abd Acute W/chest  11/07/2012   *RADIOLOGY REPORT*  Clinical Data: Metastatic cancer.  Pain and vomiting.  ACUTE ABDOMEN SERIES (ABDOMEN 2 VIEW & CHEST 1 VIEW)  Comparison: MRI 10/28/2012.  CT 08/04/2012.  Findings: There is increased soft tissue density in the upper and mid abdomen consistent with organomegaly due to massive metastatic disease.  Intestinal gas is displaced caudally.  No sign of bowel obstruction.  No free air.  Bony metastatic disease remains evident.  One-view chest shows the heart at the upper limits of normal in size.  No free air seen under the diaphragm.  Bony metastatic disease remains evident.  Lungs are clear.  IMPRESSION: Increased soft tissue density in the upper  abdomen consistent with organomegaly/massive metastatic disease.  Displacement of the bowel caudally.  No sign of obstruction or free air however.   Original Report Authenticated By: [name], M.D.    Assessment: 47 y.o. [place] woman  1. 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  2.Status post adjuvant chemotherapy July to November 2011, consisting of 4 cycles of dose dense doxorubicin and cyclophosphamide, followed by 4 of 12 planned weekly doses of paclitaxel given adjuvantly, discontinued secondary to peripheral neuropathy.  3. Status post radiation to the right chest wall, truncated due to problems with the patient missing appointments.  4. Tamoxifen started June of 2012, but discontinued due to nausea.  5. metastatic disease to liver and bone pathologically documented October of 2012, estrogen receptor 100% positive, progesterone receptor 7% positive, with no HER-2 amplification; treatment in the metastatic setting has consisted of:  6.Faslodex started 03/16/2011, discontinued November 2013 with progression. 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 Status post thoracic laminectomy, T9-T11, for impending cord compression on 03/21/2011, followed by irradiation to the spine completed November 2011  8. 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 s/p palliative radiation to the Right hip clivus to 30 Gy completed 04/07/2012  10. Progression noted by scans in November 2013, at which time  Faslodex was discontinued. Patient was started on exemestane in early December, with everolimus started in mid December at 5 mg daily. Discontinued 11/07/2012 with evidence of progression. Also received Zoladex injections every 3 months, last given 09/05/2012.  11. Nondisplaced right femoral neck fracture, status post right hip arthroplasty on 05/04/2012 under the care of Dr[facility]Matt [facility].  12. Hypercalcemia with elevated serum creatinine: resolved  13. s/p palliative radiation therapy for cord compression completed 08/15/2012  14. Poorly controlled pain.  15. Malnutrition, protein calorie, severe   Plan:  Shikira will be going to [employer] today. Family is evry anxious about it and I discussed prognosis and he meaning of palliation with patient's "little sister" (youngest of 5). I will follow up with the patient after transfer, visiting weekly. Greatly appreciate Hospitalist's help to this patient!  Lowella Dell, MD 11/09/2012  7:50 AM

## 2012-11-09 NOTE — Progress Notes (Signed)
Report given to [facility].  

## 2012-11-09 NOTE — Progress Notes (Signed)
Patient cleared for discharge to [facility]. Packet copied and placed in [place]. Patient wishes family to drive her.  Kunaal Walkins C. Magdaleno Lortie MSW, LCSW [phone]

## 2012-11-09 NOTE — Discharge Summary (Signed)
Physician Discharge Summary  EMREY THORNLEY GNF:[id number] DOB: August 22, 1965 DOA: 11/07/2012  PCP: Pcp Not In System  Admit date: 11/07/2012 Discharge date: 11/09/2012  Time spent: 35 minutes  Recommendations for Outpatient Follow-up:  1. Comfort measure.   Discharge Diagnoses:    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, brain and bone   GERD (gastroesophageal reflux disease)   Hypertension   Hypercalcemia   Constipation   Normocytic anemia   Abdominal pain   Alkaline phosphatase elevation   Protein-calorie malnutrition, severe   Discharge Condition: Guarded.  Diet recommendation: Regular diet  Filed Weights   11/07/12 1256  Weight: 47.718 kg (105 lb 3.2 oz)    History of present illness:  [name] is an 47 y.o. female with PMH of metastatic breast cancer, chronic pain, who has been managed by Dr. Darnelle Catalan, who presents with uncontrolled pain in abdomen and legs. The pain in her legs is burning in quality. Her abdominal pain and distention appear to be from metastatic involvement and constipation. Pain films in the emergency department did not show any evidence of obstruction or free air. The patient tells me that she had been taking Percocet, 2-3 tablets every 6 hours with no significant relief. Her aunt became concerned about her escalating use of pain medications, and took away her Percocet so that she could administer them to her. She reports that she had some nausea and vomiting last night. Her last use of Percocet was around 0400. The patient was referred for hospital inpatient admission secondary to uncontrolled pain in the setting of advanced malignancy.   Hospital Course:  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, brain and bone / pain related to malignancy  -Continue Decadron. Discontinue Duragesic patch and Percocet and placed on a continuous morphine drip at 5 mg per hour. Consider also 4 mg  IV morphine for breakthrough pain.  -Continue Flexeril as needed for muscle  spasms.  -Dr Darnelle Catalan , patient 's oncologist recommend palliative care.  -Patient to be transfer to [facility].  -Patient wants to continue taking Aromasin and afinitor.   Severe protein calorie malnutrition  -Secondary cancer related cachexia. Ensure supplements and a dietitian consultation ordered.  GERD (gastroesophageal reflux disease)  -Continue PPI therapy.  Hypercalcemia  -Hydrate, Corrected calcium is 10.5.  Constipation with some generalized abdominal pain and distention  she has not noted bowel, KUB shows no obstruction, have added Crestor along with Colace, MiraLAX and milk of magnesia. Patient had a BM today.  Normocytic anemia  -Continue iron supplementation.  Alkaline phosphatase elevation  -Secondary to liver and bone metastasis.   Procedures:  none  Consultations:  Dr Darnelle Catalan.   Discharge Exam: Filed Vitals:   11/08/12 0518 11/08/12 1347 11/08/12 2158 11/09/12 0838  BP: 126/78 129/76 149/89 128/96  Pulse: 109 103 100 102  Temp: 99.8 F (37.7 C) 98.2 F (36.8 C) 98.1 F (36.7 C) 98.4 F (36.9 C)  TempSrc: Oral Oral Oral Oral  Resp: 18 18 18 18   Height:      Weight:      SpO2: 98% 100% 99% 99%    General: No distress, awake.  Cardiovascular: S 1, S 2 RRR Respiratory: CTA  Discharge Instructions  Discharge Orders   Future Orders Complete By Expires     Increase activity slowly  As directed         Medication List    STOP taking these medications       everolimus 5 MG tablet  Commonly known as:  Cleon Gustin  exemestane 25 MG tablet  Commonly known as:  AROMASIN     ferrous sulfate 325 (65 FE) MG tablet     oxyCODONE-acetaminophen 10-325 MG per tablet  Commonly known as:  PERCOCET      TAKE these medications       cyclobenzaprine 10 MG tablet  Commonly known as:  FLEXERIL  Take 1 tablet (10 mg total) by mouth 3 (three) times daily as needed for muscle spasms.     dexamethasone 4 MG tablet  Commonly known as:  DECADRON   Take 4 mg by mouth daily with breakfast.     dextrose 5 % SOLN 250 mL with morphine 25 MG/ML SOLN 250 mg  Inject 5 mg/hr into the vein continuous.     DSS 100 MG Caps  Take 200 mg by mouth 2 (two) times daily.     DULCOLAX 10 MG suppository  Generic drug:  bisacodyl  Place 10 mg rectally daily as needed for constipation.     emollient cream  Commonly known as:  BIAFINE  Apply 1 application topically 2 (two) times daily. Apply  Cream Biafine  To mid back down to sacrum twice daily starting today after radiation treatment, for 1-2 weeks to prevent desqaumation     feeding supplement Liqd  Take 237 mLs by mouth 4 (four) times daily.     fentaNYL 75 MCG/HR  Commonly known as:  DURAGESIC - dosed mcg/hr  Place 2 patches onto the skin every 3 (three) days.     fluconazole 100 MG tablet  Commonly known as:  DIFLUCAN  Take 100 mg by mouth daily.     Aromasin 25 mg po daily. Afinitor 5 mg po daily.          ondansetron 4 MG tablet  Commonly known as:  ZOFRAN  Take 4 mg by mouth every 6 (six) hours.     pantoprazole 40 MG tablet  Commonly known as:  PROTONIX  Take 1 tablet (40 mg total) by mouth daily.     polyethylene glycol packet  Commonly known as:  MIRALAX / GLYCOLAX  Take 17 g by mouth daily.     prochlorperazine 10 MG tablet  Commonly known as:  COMPAZINE  Take 1 tablet (10 mg total) by mouth every 6 (six) hours as needed (nausea or vomiting).     zolpidem 5 MG tablet  Commonly known as:  AMBIEN  Take 5 mg by mouth at bedtime as needed for sleep.       No Known Allergies    The results of significant diagnostics from this hospitalization (including imaging, microbiology, ancillary and laboratory) are listed below for reference.    Significant Diagnostic Studies: Dg Pelvis 1-2 Views  10/16/2012   *RADIOLOGY REPORT*  Clinical Data: Worsening left groin and hip pain.  PELVIS - 1-2 VIEW  Comparison: 09/27/2012  Findings: Right hip prosthesis remains in  appropriate position.  No evidence of pelvic fracture or diastasis.  No other pelvic bone abnormality identified.  Small calcified fibroids again seen in the central pelvis.  IMPRESSION:  1.  No acute findings. 2.  Right hip prosthesis in appropriate position. 3.  Small calcified fibroids.   Original Report Authenticated By: Myles Rosenthal, M.D.   Mr Thoracic Spine W Wo Contrast  10/27/2012   *RADIOLOGY REPORT*  Clinical Data:  Metastatic breast cancer status post radiation therapy.  Severe low back pain with worsening left leg pain.  MRI THORACIC AND LUMBAR SPINE WITHOUT AND WITH CONTRAST  Technique:  Multiplanar and multiecho pulse sequences of the thoracic and lumbar spine were obtained without and with intravenous contrast.  Contrast: 9mL MULTIHANCE GADOBENATE DIMEGLUMINE 529 MG/ML IV SOLN  Comparison:  CTs of the chest, abdomen and pelvis 08/04/2012. Thoracic MRI 07/21/2012.  Lumbar MRI 03/13/2011.  MRI THORACIC SPINE  Findings: Diffuse metastatic disease to the cervicothoracic spine and multiple ribs is again noted.  No pathologic fractures are identified.  There are stable postsurgical changes status post T9- T11 posterior decompression.  No significant epidural disease is identified within the thoracic spine.  There is disease at the thoracolumbar junction, further described on the lumbar spine report below.  The thoracic cord is normal in signal and caliber.  There is no abnormal intradural enhancement.  A right pleural effusion and multifocal hepatic metastatic disease are noted.  IMPRESSION:  1.  No significant change in multifocal osseous metastases. 2.  No pathologic fracture or recurrent epidural tumor is identified within the thoracic spine.  See lumbar spine findings below. 3.  Hepatic metastatic disease.  MRI LUMBAR SPINE  Findings: Multifocal metastases are demonstrated within the lumbar spine, upper sacrum and iliac bones bilaterally.  These have progressed from the 2012 MRI, but are similar to the  more recent abdominal pelvic CT.  The spinal involvement is greatest at L1 and L2, although there is also significant involvement within the L2 vertebral body and its right pedicle, the right L5 pedicle, in the upper and mid sacrum. No pathologic fractures are identified.  Epidural tumor associated with the L1 vertebral body and its right pedicle is asymmetric to the right, extends into the right L1-L2 foramen and moderately compresses the thecal sac. The associated mass effect on the thecal sac has mildly worsened since the CT of 10 weeks ago.  There is significant epidural tumor bilaterally at L3 with resulting moderate compression of the thecal sac and right greater than left L3-L4 neural foraminal extension.  This appears similar to the prior CT.  A small amount of epidural tumor is noted laterally in the right L5-S1 foramen related to tumor in the right L5 pedicle.  There is also some epidural tumor associated with an expansile metastasis within the third sacral segment.  No abnormal intradural enhancement is identified.  There are no paraspinal masses.  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  IMPRESSION:  1.  Slowly progressive widespread metastatic disease to the lumbar spine, sacrum and iliac bones as described.  No pathologic fractures identified. 2.  There is associated epidural tumor, especially on the right at L1-L2 and bilaterally at L3-L4. At both levels, there is deformity of the thecal sac and neural foraminal extension asymmetric to the right.  There is some epidural tumor on the left at L3 which could account for the patient's symptoms.  The epidural disease appears only slightly progressive compared with the abdominal CT of 10 weeks prior.   Original Report Authenticated By: Carey Bullocks, M.D.   Mr Lumbar Spine W Wo Contrast  10/27/2012   *RADIOLOGY REPORT*  Clinical Data:  Metastatic breast cancer status post radiation therapy.  Severe low back pain with worsening left leg pain.  MRI  THORACIC AND LUMBAR SPINE WITHOUT AND WITH CONTRAST  Technique:  Multiplanar and multiecho pulse sequences of the thoracic and lumbar spine were obtained without and with intravenous contrast.  Contrast: 9mL MULTIHANCE GADOBENATE DIMEGLUMINE 529 MG/ML IV SOLN  Comparison:  CTs of the chest, abdomen and pelvis 08/04/2012. Thoracic MRI 07/21/2012.  Lumbar MRI 03/13/2011.  MRI THORACIC SPINE  Findings: Diffuse metastatic disease to the cervicothoracic spine and multiple ribs is again noted.  No pathologic fractures are identified.  There are stable postsurgical changes status post T9- T11 posterior decompression.  No significant epidural disease is identified within the thoracic spine.  There is disease at the thoracolumbar junction, further described on the lumbar spine report below.  The thoracic cord is normal in signal and caliber.  There is no abnormal intradural enhancement.  A right pleural effusion and multifocal hepatic metastatic disease are noted.  IMPRESSION:  1.  No significant change in multifocal osseous metastases. 2.  No pathologic fracture or recurrent epidural tumor is identified within the thoracic spine.  See lumbar spine findings below. 3.  Hepatic metastatic disease.  MRI LUMBAR SPINE  Findings: Multifocal metastases are demonstrated within the lumbar spine, upper sacrum and iliac bones bilaterally.  These have progressed from the 2012 MRI, but are similar to the more recent abdominal pelvic CT.  The spinal involvement is greatest at L1 and L2, although there is also significant involvement within the L2 vertebral body and its right pedicle, the right L5 pedicle, in the upper and mid sacrum. No pathologic fractures are identified.  Epidural tumor associated with the L1 vertebral body and its right pedicle is asymmetric to the right, extends into the right L1-L2 foramen and moderately compresses the thecal sac. The associated mass effect on the thecal sac has mildly worsened since the CT of 10  weeks ago.  There is significant epidural tumor bilaterally at L3 with resulting moderate compression of the thecal sac and right greater than left L3-L4 neural foraminal extension.  This appears similar to the prior CT.  A small amount of epidural tumor is noted laterally in the right L5-S1 foramen related to tumor in the right L5 pedicle.  There is also some epidural tumor associated with an expansile metastasis within the third sacral segment.  No abnormal intradural enhancement is identified.  There are no paraspinal masses.  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  IMPRESSION:  1.  Slowly progressive widespread metastatic disease to the lumbar spine, sacrum and iliac bones as described.  No pathologic fractures identified. 2.  There is associated epidural tumor, especially on the right at L1-L2 and bilaterally at L3-L4. At both levels, there is deformity of the thecal sac and neural foraminal extension asymmetric to the right.  There is some epidural tumor on the left at L3 which could account for the patient's symptoms.  The epidural disease appears only slightly progressive compared with the abdominal CT of 10 weeks prior.   Original Report Authenticated By: Carey Bullocks, M.D.   Mr Liver W Wo Contrast  10/28/2012   *RADIOLOGY REPORT*  Clinical Data:  History breast cancer with widespread metastatic disease.  Follow-up evaluation of liver metastases.  MRI ABDOMEN WITHOUT AND WITH CONTRAST  Technique:  Multiplanar multisequence MR imaging of the abdomen was performed both before and after the administration of intravenous contrast.  Contrast: 9mL MULTIHANCE GADOBENATE DIMEGLUMINE 529 MG/ML IV SOLN  Comparison:  Multiple priors, most recently a CT of the chest abdomen and pelvis 08/04/2012.  Findings:  Again noted are innumerable hepatic nodules and masses. These are heterogeneous in signal intensity on all pulse sequences, including high signal intensity areas on pre gadolinium T1-weighted images,  compatible with areas of intratumoral hemorrhage and/or highly proteinaceous content.  Post-gadolinium images demonstrates predominately peripheral enhancement which is progressive over time and slowly increasing on more delayed sequences.  The largest single mass is located in the central aspect of the liver measuring up to 12.3 x 10.7 x 11.9 cm (image 33 of series 13), slightly larger than the prior examination from 08/04/2012.  Previously noted lesion in the inferior aspect of segment 4B of the liver (image 76 of series 13) has also increased, currently measuring 7.0 x 6.3 cm (previously 6.0 x 5.9 cm).  No definite new hepatic lesions are noted.  Parenchymal thinning is again noted in the lateral aspect of the left kidney, compatible with some mild chronic scarring.  The appearance of the gallbladder, pancreas, spleen, bilateral adrenal glands and right kidney is unremarkable.  Small bilateral pleural effusions (right greater than left) layering dependently. Diffusely heterogeneous appearance of the visualized axial and appendicular skeleton with multiple enhancing lesions, compatible with widespread metastatic disease to the bones.  This includes a large expansile lesion in the posterior aspect of the right 12th rib with probable pathologic fracture (image 42 of series 1101). Additionally, at L1-L2 and L3-L4 there is a large amount of enhancing epidural soft tissue.  IMPRESSION: 1.  Findings, as above, compatible with progressive metastatic disease to the liver with enlarging hepatic nodules and masses, as above.  No definite new hepatic lesions are identified. 2.  Widespread metastatic disease to the bones redemonstrated. This includes a pathologic fracture of the posterior aspect of the right 12th rib, and epidural extension of tumor at the level of L1-L2 and L3-L4. 3.  Small bilateral pleural effusions (right greater than left), similar to the prior examination.   Original Report Authenticated By: Trudie Reed, M.D.   Dg Abd Acute W/chest  11/07/2012   *RADIOLOGY REPORT*  Clinical Data: Metastatic cancer.  Pain and vomiting.  ACUTE ABDOMEN SERIES (ABDOMEN 2 VIEW & CHEST 1 VIEW)  Comparison: MRI 10/28/2012.  CT 08/04/2012.  Findings: There is increased soft tissue density in the upper and mid abdomen consistent with organomegaly due to massive metastatic disease.  Intestinal gas is displaced caudally.  No sign of bowel obstruction.  No free air.  Bony metastatic disease remains evident.  One-view chest shows the heart at the upper limits of normal in size.  No free air seen under the diaphragm.  Bony metastatic disease remains evident.  Lungs are clear.  IMPRESSION: Increased soft tissue density in the upper abdomen consistent with organomegaly/massive metastatic disease.  Displacement of the bowel caudally.  No sign of obstruction or free air however.   Original Report Authenticated By: Paulina Fusi, M.D.    Microbiology: No results found for this or any previous visit (from the past 240 hour(s)).   Labs: Basic Metabolic Panel:  Recent Labs Lab 11/07/12 0905 11/09/12 0510  NA 137 137  K 4.1 4.2  CL 97 100  CO2 24 26  GLUCOSE 89 110*  BUN 10 9  CREATININE 0.55 0.46*  CALCIUM 9.7 9.4   Liver Function Tests:  Recent Labs Lab 11/07/12 0905  AST 80*  ALT 24  ALKPHOS 1043*  BILITOT 0.7  PROT 8.1  ALBUMIN 3.0*   No results found for this basename: LIPASE, AMYLASE,  in the last 168 hours No results found for this basename: AMMONIA,  in the last 168 hours CBC:  Recent Labs Lab 11/07/12 0905  WBC 5.1  HGB 9.2*  HCT 28.9*  MCV 82.3  PLT 425*   Cardiac Enzymes: No results found for this basename: CKTOTAL, CKMB, CKMBINDEX, TROPONINI,  in the last 168 hours BNP: BNP (last 3 results) No  results found for this basename: PROBNP,  in the last 8760 hours CBG: No results found for this basename: GLUCAP,  in the last 168 hours     Signed:  Nashaun Hillmer  [facility] 11/09/2012, 9:11 AM

## 2012-11-11 ENCOUNTER — Other Ambulatory Visit: Payer: Self-pay | Admitting: *Deleted

## 2012-11-14 ENCOUNTER — Other Ambulatory Visit: Payer: Self-pay | Admitting: Oncology

## 2012-11-14 ENCOUNTER — Ambulatory Visit (HOSPITAL_COMMUNITY)
Admission: RE | Admit: 2012-11-14 | Discharge: 2012-11-14 | Disposition: A | Discharge status: Home / Self Care | Payer: Medicaid Other | Source: Ambulatory Visit | Attending: Oncology | Admitting: Oncology

## 2012-11-14 ENCOUNTER — Telehealth: Payer: Self-pay | Admitting: *Deleted

## 2012-11-14 DIAGNOSIS — C7951 Secondary malignant neoplasm of bone: Secondary | ICD-10-CM | POA: Insufficient documentation

## 2012-11-14 DIAGNOSIS — C7952 Secondary malignant neoplasm of bone marrow: Secondary | ICD-10-CM | POA: Insufficient documentation

## 2012-11-14 DIAGNOSIS — C50919 Malignant neoplasm of unspecified site of unspecified female breast: Secondary | ICD-10-CM | POA: Insufficient documentation

## 2012-11-14 DIAGNOSIS — G893 Neoplasm related pain (acute) (chronic): Secondary | ICD-10-CM | POA: Insufficient documentation

## 2012-11-14 MED ORDER — LIDOCAINE HCL 1 % IJ SOLN
INTRAMUSCULAR | Status: AC
  Filled 2012-11-14: qty 20

## 2012-11-14 NOTE — Telephone Encounter (Signed)
Herbert Seta called reporting patient has pulled the subcutaneous morphine gtt out.  Nurses have had to replace this several times and she is not tolerating this well.  Asked for an order for P.I.C.C. placement.    Verbal order received and read back from Dr. Darnelle Catalan for P.I.C.C. placement.  Order given to [facility] at this time.

## 2012-11-14 NOTE — Procedures (Signed)
Successful placement of dual lumen PICC line to left brachial vein. Length 47cm Tip at lower SVC/RA No complications Ready for use.  Brayton El PA-C Interventional Radiology 11/14/2012 4:26 PM

## 2012-11-18 ENCOUNTER — Telehealth: Payer: Self-pay | Admitting: Dietician

## 2012-11-22 ENCOUNTER — Encounter: Payer: Self-pay | Admitting: Radiation Oncology

## 2012-11-22 NOTE — Progress Notes (Signed)
  Radiation Oncology         [phone] ________________________________  Name: [name] MRN: [id number]  Date: 10/18/12  DOB: 1965-06-11  Simulation Verification Note  Status: inpatient  NARRATIVE: The patient was brought to the treatment unit and placed in the planned treatment position. The clinical setup was verified. Then port films were obtained and uploaded to the radiation oncology medical record software.  The treatment beams were carefully compared against the planned radiation fields. The position location and shape of the radiation fields was reviewed. The targeted volume of tissue appears appropriately covered by the radiation beams. Organs at risk appear to be excluded as planned.  Based on my personal review, I approved the simulation verification. She then received her single fraction treatment and was transferred back to the floor.   ------------------------------------------------  [name], MD

## 2012-11-22 NOTE — Progress Notes (Signed)
  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   She was educated on the possible skin toxicity given the single fraction treatment. This had to be balanced against her poor prognosis as well as her pain.  Plan: The patient has completed radiation treatment. The patient will return to radiation oncology clinic for routine followup in one month. I advised them to call or return sooner if they have any questions or concerns related to their recovery or treatment. She also has regularly scheduled followup with medical oncology.  ------------------------------------------------  [name], MD

## 2012-11-22 NOTE — Progress Notes (Signed)
Name: [name]   MRN: [id number]  Date:  11/22/2012  DOB: [birth year]  Status:Inpatient    DIAGNOSIS: Metastatic breast cancer to spine.  CONSENT VERIFIED: yes   SET UP: Patient is setup supine   IMMOBILIZATION:  The following immobilization was used: Alpha cradle  NARRATIVE:  [name] unfortunately has developed progressive disease throughout the lumbar and sacral spine. Dr. Darnelle Catalan asked me to evaluate her for palliative radiation to the rest of her spine. She remains unwilling to enroll in hospice services. She has a limited life expectancy given the progression of her liver disease. [name] was brought to the CT Simulation planning suite.  Identity was confirmed.  All relevant records and images related to the planned course of therapy were reviewed.  Then, the patient was positioned in a stable reproducible clinical set-up for radiation therapy.  CT images were obtained.  An isocenter was placed. Skin markings were placed.  The CT images were loaded into the planning software where the target and avoidance structures were contoured.  The radiation prescription was entered and confirmed. The patient was discharged in stable condition and tolerated simulation well.    TREATMENT PLANNING NOTE:  Treatment planning then occurred. I have requested : MLC's, isodose plan, basic dose calculation  I have requested 3 dimensional simulation with DVH of cord, kidneys, bowel and GTV  A total of 2 complex treatment devices were constructed under my direct supervision.  She will be treated using a single PA field abutting her previously radiated area.

## 2012-12-06 ENCOUNTER — Other Ambulatory Visit: Payer: Self-pay | Admitting: *Deleted

## 2012-12-06 ENCOUNTER — Telehealth: Payer: Self-pay | Admitting: *Deleted

## 2012-12-06 ENCOUNTER — Other Ambulatory Visit: Payer: Self-pay | Admitting: Oncology

## 2012-12-06 DIAGNOSIS — C50919 Malignant neoplasm of unspecified site of unspecified female breast: Secondary | ICD-10-CM

## 2012-12-06 MED ORDER — PROMETHAZINE HCL 25 MG PO TABS: 25.0000 mg | ORAL_TABLET | Freq: Four times a day (QID) | ORAL | Status: DC | PRN

## 2012-12-06 MED ORDER — DEXAMETHASONE 4 MG PO TABS: 8.0000 mg | ORAL_TABLET | Freq: Two times a day (BID) | ORAL | Status: DC

## 2012-12-06 MED ORDER — EXEMESTANE 25 MG PO TABS: 25.0000 mg | ORAL_TABLET | Freq: Every day | ORAL | Status: DC

## 2012-12-06 MED ORDER — OMEPRAZOLE 40 MG PO CPDR: 40.0000 mg | DELAYED_RELEASE_CAPSULE | Freq: Every day | ORAL | Status: AC

## 2012-12-06 NOTE — Telephone Encounter (Signed)
This RN spoke with Pam RN with [facility] with her update with medications.  Per her review with pt, pt states pill that " makes her hallucinate is the prochloperizine " pt has a blister pak of phenergan she obtained from [facility] and would prefer to use it.  Pam inquired if aromasin and affintor is to be continued with pt telling her she is not to continue the affinitor.  Lyana also states complaints of thrush occurring.  This RN informed Pam above medication questions will be reviewed with MD. Also informed her of need to increase decadron to 8mg  bid due to probable progressive brain mets with MRI being scheduled.  Per MD review recommended to stop the afinitor, continue drugs above including diflucan and phenergan while pt is on the decadron.  Medication refills sent via E scribe to [facility] pharmacy with notation that pt is now under Hospice Services.

## 2012-12-06 NOTE — Telephone Encounter (Signed)
Pt left message this AM stating " I need to know what you all are going to do about my eye ". No return call number left but noted per caller ID as [phone].  This RN returned call to pt and inquired about her current status post leaving [facility] before MD saw her on Thursday 7/2 and abruptness of her discharge.  Danna states " I didn't just leave there and never came back ". This RN inquired about pump and if still attached, with Genella stating " Yes, but I have home health coming out and taking care of it ".  Presently Alberta's main concern is her eye and visual changes. This RN explained to her above probably related to known areas in brain and increased swelling, her concerns will be given to MD for review for appropriate recommendation.  Per MD - pt needs to increase decadron to mg BID but also requested communication with [facility] for understanding of discharge situation due to his last understanding is pt went " missing " at [facility] on Tuesday.  This RN called to [employer] and spoke with [employer]. She states pt was " in and out all weekend- without telling us she was leaving ". " she was last seen on Monday 6/30 and then Dennie Bible called her on Tuesday to see where she was due to need for medications"- Louanna told her " I'm on my way ". " Tinsley did not come back until Wednesday to get her stuff urgently- she stayed all of 28 minutes of which I had to pretty much run after her to provide a dressing change to her PICC and give her a list for her discharge medications"  Per Herbert Seta pt is now under [facility] with noted assigned nurse as Elijah Birk. " there is a note from RN Earma Reading stating she saw patient and changed her MSO4 bag "  This RN returned call to [facility] and discussed MD recommendation to increase the dexamethasone. Lanesha states " is that the pill that makes me hallucinate " . This RN inquired what color is pill that causes this side effect with [place]  unsure if it is " the small green one or the white one for anxiety ". Delcie is unable to read labels due to visual issues.  This RN informed pt of need for home health to come to her home and assess her situation and call us for appropriate orders to help Asani. This RN verified pt's new address as [address] , [place], with [employer] correcting me with " it is Road not [employer]  This RN then contacted [facility] and spoke with nurse manager Aggie Cosier per above. Discussed need for RN to go to home for assessment of eye and medication review for intervention. This RN gave Aggie Cosier RN direct line as well as contact number for MD for direct communication with nurse and patient while in the home.  Aggie Cosier will have a nurse go out today.  This RN then returned call to [facility] and informed her of above and need for RN to see her for best care. Sharnette verbalized understanding.  MD made aware of above.

## 2012-12-08 ENCOUNTER — Ambulatory Visit (HOSPITAL_COMMUNITY)
Admission: RE | Admit: 2012-12-08 | Discharge: 2012-12-08 | Disposition: A | Discharge status: Home / Self Care | Payer: Medicaid Other | Source: Ambulatory Visit | Attending: Oncology | Admitting: Oncology

## 2012-12-08 DIAGNOSIS — C7931 Secondary malignant neoplasm of brain: Secondary | ICD-10-CM | POA: Insufficient documentation

## 2012-12-08 DIAGNOSIS — C50919 Malignant neoplasm of unspecified site of unspecified female breast: Secondary | ICD-10-CM | POA: Insufficient documentation

## 2012-12-08 DIAGNOSIS — C7951 Secondary malignant neoplasm of bone: Secondary | ICD-10-CM | POA: Insufficient documentation

## 2012-12-08 DIAGNOSIS — C787 Secondary malignant neoplasm of liver and intrahepatic bile duct: Secondary | ICD-10-CM

## 2012-12-08 DIAGNOSIS — C7952 Secondary malignant neoplasm of bone marrow: Secondary | ICD-10-CM | POA: Insufficient documentation

## 2012-12-08 MED ORDER — GADOBENATE DIMEGLUMINE 529 MG/ML IV SOLN
10.0000 mL | Freq: Once | INTRAVENOUS | Status: AC | PRN
  Administered 2012-12-08: 10 mL via INTRAVENOUS

## 2012-12-09 ENCOUNTER — Other Ambulatory Visit: Payer: Self-pay | Admitting: *Deleted

## 2012-12-09 MED ORDER — FLUCONAZOLE 100 MG PO TABS: 100.0000 mg | ORAL_TABLET | Freq: Every day | ORAL | Status: DC

## 2012-12-09 MED ORDER — EXEMESTANE 25 MG PO TABS: 25.0000 mg | ORAL_TABLET | Freq: Every day | ORAL | Status: DC

## 2012-12-09 NOTE — Telephone Encounter (Signed)
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  This RN spoke with Elijah Birk RN with [facility] per above.  E-pof sent to scheduling with above request as well.

## 2012-12-10 ENCOUNTER — Encounter (HOSPITAL_COMMUNITY): Payer: Self-pay | Admitting: Emergency Medicine

## 2012-12-10 ENCOUNTER — Emergency Department (HOSPITAL_COMMUNITY): Payer: Medicaid Other

## 2012-12-10 ENCOUNTER — Emergency Department (HOSPITAL_COMMUNITY)
Admission: EM | Admit: 2012-12-10 | Discharge: 2012-12-10 | Disposition: A | Discharge status: Home / Self Care | Payer: Medicaid Other | Attending: Emergency Medicine | Admitting: Emergency Medicine

## 2012-12-10 DIAGNOSIS — I10 Essential (primary) hypertension: Secondary | ICD-10-CM | POA: Insufficient documentation

## 2012-12-10 DIAGNOSIS — Z862 Personal history of diseases of the blood and blood-forming organs and certain disorders involving the immune mechanism: Secondary | ICD-10-CM | POA: Insufficient documentation

## 2012-12-10 DIAGNOSIS — R188 Other ascites: Secondary | ICD-10-CM | POA: Insufficient documentation

## 2012-12-10 DIAGNOSIS — Z8639 Personal history of other endocrine, nutritional and metabolic disease: Secondary | ICD-10-CM | POA: Insufficient documentation

## 2012-12-10 DIAGNOSIS — Z853 Personal history of malignant neoplasm of breast: Secondary | ICD-10-CM | POA: Insufficient documentation

## 2012-12-10 DIAGNOSIS — Z9221 Personal history of antineoplastic chemotherapy: Secondary | ICD-10-CM | POA: Insufficient documentation

## 2012-12-10 DIAGNOSIS — R630 Anorexia: Secondary | ICD-10-CM | POA: Insufficient documentation

## 2012-12-10 DIAGNOSIS — R197 Diarrhea, unspecified: Secondary | ICD-10-CM | POA: Insufficient documentation

## 2012-12-10 DIAGNOSIS — Z9010 Acquired absence of unspecified breast and nipple: Secondary | ICD-10-CM | POA: Insufficient documentation

## 2012-12-10 DIAGNOSIS — Z8505 Personal history of malignant neoplasm of liver: Secondary | ICD-10-CM | POA: Insufficient documentation

## 2012-12-10 DIAGNOSIS — R160 Hepatomegaly, not elsewhere classified: Secondary | ICD-10-CM | POA: Insufficient documentation

## 2012-12-10 DIAGNOSIS — R000 Tachycardia, unspecified: Secondary | ICD-10-CM | POA: Insufficient documentation

## 2012-12-10 DIAGNOSIS — Z87891 Personal history of nicotine dependence: Secondary | ICD-10-CM | POA: Insufficient documentation

## 2012-12-10 DIAGNOSIS — R112 Nausea with vomiting, unspecified: Secondary | ICD-10-CM | POA: Insufficient documentation

## 2012-12-10 DIAGNOSIS — Z923 Personal history of irradiation: Secondary | ICD-10-CM | POA: Insufficient documentation

## 2012-12-10 DIAGNOSIS — Z85830 Personal history of malignant neoplasm of bone: Secondary | ICD-10-CM | POA: Insufficient documentation

## 2012-12-10 DIAGNOSIS — Z85841 Personal history of malignant neoplasm of brain: Secondary | ICD-10-CM | POA: Insufficient documentation

## 2012-12-10 DIAGNOSIS — R5381 Other malaise: Secondary | ICD-10-CM | POA: Insufficient documentation

## 2012-12-10 DIAGNOSIS — Z87440 Personal history of urinary (tract) infections: Secondary | ICD-10-CM | POA: Insufficient documentation

## 2012-12-10 DIAGNOSIS — Z87448 Personal history of other diseases of urinary system: Secondary | ICD-10-CM | POA: Insufficient documentation

## 2012-12-10 DIAGNOSIS — K219 Gastro-esophageal reflux disease without esophagitis: Secondary | ICD-10-CM | POA: Insufficient documentation

## 2012-12-10 DIAGNOSIS — R109 Unspecified abdominal pain: Secondary | ICD-10-CM

## 2012-12-10 LAB — CBC
Hemoglobin: 8.7 g/dL — ABNORMAL LOW (ref 12.0–15.0)
MCH: 26.2 pg (ref 26.0–34.0)
Platelets: 343 10*3/uL (ref 150–400)
RBC: 3.32 MIL/uL — ABNORMAL LOW (ref 3.87–5.11)
WBC: 5.9 10*3/uL (ref 4.0–10.5)

## 2012-12-10 LAB — BASIC METABOLIC PANEL
CO2: 23 mEq/L (ref 19–32)
Calcium: 9.3 mg/dL (ref 8.4–10.5)
Chloride: 102 mEq/L (ref 96–112)
Glucose, Bld: 106 mg/dL — ABNORMAL HIGH (ref 70–99)
Sodium: 139 mEq/L (ref 135–145)

## 2012-12-10 MED ORDER — ONDANSETRON HCL 4 MG/2ML IJ SOLN
4.0000 mg | Freq: Once | INTRAMUSCULAR | Status: AC
  Administered 2012-12-10: 4 mg via INTRAVENOUS
  Filled 2012-12-10: qty 2

## 2012-12-10 MED ORDER — OXYCODONE HCL 5 MG PO TABS: 5.0000 mg | ORAL_TABLET | ORAL | Status: DC | PRN

## 2012-12-10 MED ORDER — ONDANSETRON 4 MG PO TBDP: ORAL_TABLET | ORAL | Status: DC

## 2012-12-10 MED ORDER — HYDROMORPHONE HCL PF 1 MG/ML IJ SOLN
1.0000 mg | Freq: Once | INTRAMUSCULAR | Status: AC
  Administered 2012-12-10: 1 mg via INTRAVENOUS
  Filled 2012-12-10: qty 1

## 2012-12-10 MED ORDER — ONDANSETRON 8 MG PO TBDP
8.0000 mg | ORAL_TABLET | Freq: Once | ORAL | Status: AC
  Administered 2012-12-10: 8 mg via ORAL
  Filled 2012-12-10: qty 1

## 2012-12-10 MED ORDER — POTASSIUM CHLORIDE CRYS ER 20 MEQ PO TBCR
40.0000 meq | EXTENDED_RELEASE_TABLET | Freq: Once | ORAL | Status: AC
  Administered 2012-12-10: 40 meq via ORAL
  Filled 2012-12-10: qty 2

## 2012-12-10 MED ORDER — SODIUM CHLORIDE 0.9 % IV BOLUS (SEPSIS)
1000.0000 mL | Freq: Once | INTRAVENOUS | Status: AC
  Administered 2012-12-10: 1000 mL via INTRAVENOUS

## 2012-12-10 MED ORDER — OXYCODONE-ACETAMINOPHEN 5-325 MG PO TABS: 1.0000 | ORAL_TABLET | ORAL | Status: DC | PRN

## 2012-12-10 NOTE — ED Notes (Signed)
Pt unable to void at this time. Will attempt later.

## 2012-12-10 NOTE — ED Notes (Signed)
Pt aware of the need for a urine sample. Female urinal at bedside.

## 2012-12-10 NOTE — ED Provider Notes (Signed)
History    CSN: [id number] Arrival date & time 12/10/12  1445  First MD Initiated Contact with Patient 12/10/12 1514     Chief Complaint  Patient presents with  . Abdominal Pain   (Consider location/radiation/quality/duration/timing/severity/associated sxs/prior Treatment) Patient is a 47 y.o. female presenting with abdominal pain. The history is provided by the patient and medical records. No language interpreter was used.  Abdominal Pain This is a new problem. The current episode started yesterday. The problem occurs intermittently. The problem has been unchanged. Associated symptoms include abdominal pain, anorexia, fatigue, nausea, vomiting and weakness. Pertinent negatives include no arthralgias, chest pain, chills, congestion, coughing, diaphoresis, fever, headaches, joint swelling, neck pain, numbness, rash, sore throat, swollen glands, urinary symptoms, vertigo or visual change. The symptoms are aggravated by eating and drinking. She has tried nothing for the symptoms.    [name] is a 47 y.o. 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 Nothing makes it better or worse and pt did not attempt any therapies at home. Emesis is NBNB. Per EMS, pt was c/o LUQ abd pain which initiated the EMS call by the hospice RN.  EMS also reports that the PICC line might be dislodged.  Pt denies fever, chills, headache, chest pain SOB, dysuria, hematuria.     Past Medical History  Diagnosis Date  . Hypertension   . GERD (gastroesophageal reflux disease)   . 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 Pyelonephritis 10/01/11  . Recurrent UTI (urinary tract infection) 10/01/11  . S/P chemotherapy, time since 4-12 weeks   . S/P radiation > 12 weeks 03/15/12- 04/07/12    right hip, clivus  . 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 Brain cancer     spread from breast ca  . Hypercalcemia 06/21/2012  . History of radiation therapy 08/06/10 -09/16/10    R supraclav fossa, R chest wall, R drain site  . radiaiton therapy 07/22/12 -08/15/12    T4-T7   Past Surgical History  Procedure Laterality Date  . Cesarean section  1989; 2010  . Mastectomy  2011    right  . Back surgery  2012    "removed tumor,cancer, from my spine  . Port-a-cath removal  2012    right chest  . Portacath placement  2011    right  . Tubal ligation  2010  . Breast biopsy  2011    right  . Hip arthroplasty  05/04/2012    Procedure: ARTHROPLASTY BIPOLAR HIP;  Surgeon: Shelda Pal, MD;  Location: [facility];  Service: Orthopedics;  Laterality: Right;  RIGHT HIP HEMI ARTHROPLASTY    Family History  Problem Relation Age of Onset  . Cancer Brother 56    died of colon cancer at age of 68  . High blood pressure Mother    History  Substance Use Topics  . Smoking status: Former Smoker -- 0.25 packs/day for 31 years    Types: Cigarettes    Quit date: 04/26/2012  . Smokeless tobacco: Never Used  . Alcohol Use: No   OB History   Grav Para Term Preterm Abortions TAB SAB Ect Mult Living                 Review of Systems  Constitutional: Positive for fatigue. Negative for fever, chills, diaphoresis, appetite change and unexpected weight change.  HENT: Negative for congestion, sore throat, mouth sores, trouble swallowing, neck pain and neck stiffness.   Respiratory: Negative for cough, chest tightness, shortness of breath, wheezing and stridor.   Cardiovascular: Negative for chest pain and palpitations.  Gastrointestinal: Positive for nausea, vomiting, abdominal pain, diarrhea and anorexia. Negative for constipation, blood in stool, abdominal distention and rectal pain.  Genitourinary: Negative for dysuria, urgency, frequency, hematuria, flank pain and difficulty  urinating.  Musculoskeletal: Negative for back pain, joint swelling and arthralgias.  Skin: Negative for rash.  Neurological: Positive for weakness. Negative for vertigo, numbness and headaches.  Hematological: Negative for adenopathy.  Psychiatric/Behavioral: Negative for confusion.  All other systems reviewed and are negative.    Allergies  Review of patient's allergies indicates no known allergies.  Home Medications   Current Outpatient Rx  Name  Route  Sig  Dispense  Refill  . cyclobenzaprine (FLEXERIL) 10 MG tablet   Oral   Take 1 tablet (10 mg total) by mouth 3 (three) times daily as needed for muscle spasms.   30 tablet   0   . dexamethasone (DECADRON) 4 MG tablet   Oral   Take 2 tablets (8 mg total) by mouth 2 (two) times daily with a meal. [facility]   120 tablet   1   . dextrose 5 % SOLN 250 mL with morphine 25 MG/ML SOLN 250 mg   Intravenous   Inject 5 mg/hr into the vein continuous.   100 mg   0   . docusate sodium 100 MG CAPS   Oral   Take 200 mg by mouth 2 (two) times daily.   10 capsule   0   . exemestane (AROMASIN) 25 MG tablet   Oral   Take 1 tablet (25 mg total) by mouth daily after breakfast.   30 tablet   3     [facility]   . feeding supplement (ENSURE COMPLETE) LIQD   Oral   Take 237 mLs by mouth 4 (four) times daily.   30 Bottle   0   . omeprazole (PRILOSEC) 40 MG capsule   Oral   Take 1 capsule (40 mg total) by mouth daily.   30 capsule   3     [facility]   . ondansetron (ZOFRAN) 4 MG tablet   Oral   Take 4 mg by mouth every 6 (six) hours.         . polyethylene glycol (MIRALAX / GLYCOLAX) packet   Oral   Take 17 g by mouth daily.   14 each   0   . promethazine (PHENERGAN) 25 MG tablet   Oral   Take 1 tablet (25 mg total) by mouth every 6 (six) hours as needed for nausea. [facility]   30 tablet   1   . zolpidem (AMBIEN) 5 MG tablet   Oral   Take 5 mg  by mouth at bedtime as needed for sleep.         [name] ondansetron (ZOFRAN ODT) 4 MG disintegrating tablet      4mg  ODT q4 hours prn nausea/vomit   12 tablet   0   . oxyCODONE (ROXICODONE) 5 MG immediate release tablet   Oral   Take 1 tablet (5 mg total) by mouth every 4 (four) hours as needed for pain.   30 tablet  0    BP 167/98  Pulse 101  Temp(Src) 98.5 F (36.9 C) (Oral)  Resp 16  SpO2 97% Physical Exam  Nursing note and vitals reviewed. Constitutional: She is oriented to person, place, and time. She appears well-developed. No distress.  Cachectic appearing  HENT:  Head: Normocephalic and atraumatic.  Mouth/Throat: Oropharynx is clear and moist.  Eyes: Conjunctivae are normal. Pupils are equal, round, and reactive to light. No scleral icterus.  Neck: Normal range of motion.  Cardiovascular: Regular rhythm, S1 normal, S2 normal, normal heart sounds and intact distal pulses.  Tachycardia present.   Pulses:      Radial pulses are 2+ on the right side, and 2+ on the left side.       Dorsalis pedis pulses are 2+ on the right side, and 2+ on the left side.       Posterior tibial pulses are 2+ on the right side, and 2+ on the left side.  Pulmonary/Chest: Effort normal and breath sounds normal. No accessory muscle usage. Not tachypneic. No respiratory distress. She has no decreased breath sounds. She has no wheezes. She has no rhonchi. She has no rales.  Abdominal: Soft. Bowel sounds are normal. She exhibits distension and ascites (mild). She exhibits no mass. There is hepatomegaly. There is generalized tenderness ( worst in the LUQ and RUQ). There is guarding. There is no rebound.    Musculoskeletal: Normal range of motion. She exhibits no tenderness.  Lymphadenopathy:    She has no cervical adenopathy.  Neurological: She is alert and oriented to person, place, and time. She exhibits normal muscle tone. Coordination normal.  Skin: Skin is warm and dry. No rash noted. No  erythema.  Psychiatric: She has a normal mood and affect.    ED Course  Procedures (including critical care time) Labs Reviewed  CBC - Abnormal; Notable for the following:    RBC 3.32 (*)    Hemoglobin 8.7 (*)    HCT 26.6 (*)    RDW 19.4 (*)    All other components within normal limits  BASIC METABOLIC PANEL - Abnormal; Notable for the following:    Potassium 3.3 (*)    Glucose, Bld 106 (*)    All other components within normal limits  URINALYSIS, ROUTINE W REFLEX MICROSCOPIC   Dg Abd Acute W/chest  12/10/2012   *RADIOLOGY REPORT*  Clinical Data: Abdominal pain.  Metastatic breast carcinoma.  ACUTE ABDOMEN SERIES (ABDOMEN 2 VIEW & CHEST 1 VIEW)  Comparison: 11/07/2012  Findings: No evidence of dilated bowel loops.  No evidence of free air.  Diffuse hepatomegaly seen displacing bowel loops inferiorly.  Left arm PICC line is seen in appropriate position.  Both lungs are clear.  Heart size within normal limits.  IMPRESSION:  1.  Unremarkable bowel gas pattern. 2.  Hepatomegaly. 3.  No active lung disease.   Original Report Authenticated By: Myles Rosenthal, M.D.   1. Nausea vomiting and diarrhea   2. Abdominal pain     MDM  [name] presents with acute worsening of chronic abd pain with associated N/V/D.  Pt with hx of metastatic breast CA with liver mets and hepatomegaly on exam.  Pt appears mildly dehydrated.  Will insert peripheral IV and check PICC line placement via CXR and have IV team assess the line.  Pt given pain and nausea control.    5:14 PM Pt with decreased pain and nausea. No emesis in the department and tolerating PO fluids without difficulty.  Acute  abd without bowel obstruction or free air; PICC line at appropriate position.  I personally reviewed the imaging tests through PACS system.  I reviewed available ER/hospitalization records through the EMR.   Potassium replaced orally in the department and pt tolerated without difficulty.    5:51 PM Patient is nontoxic,  nonseptic appearing, in no apparent distress, sleeping throughout time in the department.  Patient's pain and other symptoms adequately managed in emergency department.  Fluid bolus given.  Labs, imaging and vitals reviewed.  Patient does not meet the SIRS or Sepsis criteria.  On repeat exam patient does not have a surgical abdomin and there are no peritoneal signs.  No concern for appendicitis, cholecystitis, diverticulitis, PID.  No indication of bowel obstruction or bowel perforation.  Patient discharged home with symptomatic treatment and given strict instructions for follow-up with their oncologist.  I have also discussed reasons to return immediately to the ER.  Patient expresses understanding and agrees with plan.  Dr. Estell Harpin was consulted and agrees with the plan.      Dahlia Client Takeru Bose, PA-C 12/10/12 1754

## 2012-12-10 NOTE — ED Provider Notes (Signed)
Medical screening examination/treatment/procedure(s) were performed by non-physician practitioner and as supervising physician I was immediately available for consultation/collaboration.   Abagayle Klutts L Litsy Epting, MD 12/10/12 2327 

## 2012-12-10 NOTE — ED Notes (Signed)
PER EMS - pt picked up from home with c/o LUQ abd pain, n/v/d x2 days.  Pt has hx of breat ca.  Hospice Rn called EMS.  Reports the picc line might possibly be dislodged.

## 2012-12-10 NOTE — ED Notes (Signed)
[id number]<GM> Expected date:<BR> Expected time:<BR> Means of arrival:<BR> Comments:<BR> EMS- abdominal pain and possible dislodged PICC line

## 2012-12-10 NOTE — ED Notes (Signed)
Patient transported to X-ray 

## 2012-12-10 NOTE — ED Notes (Signed)
Pa Bedside

## 2012-12-12 ENCOUNTER — Other Ambulatory Visit: Payer: Self-pay | Admitting: *Deleted

## 2012-12-12 ENCOUNTER — Other Ambulatory Visit: Payer: Self-pay | Admitting: Oncology

## 2012-12-12 ENCOUNTER — Telehealth: Payer: Self-pay | Admitting: Oncology

## 2012-12-12 NOTE — Telephone Encounter (Signed)
REFILL WILL BE DONE AFTER SPEAKING TO PT.'S HOSPICE NURSE, PAM PETERSON,RN.

## 2012-12-12 NOTE — Telephone Encounter (Signed)
S/w pt today re appt for 7/17 w/Dr. Michell Heinrich and 7/25 lb/AB. Per pt she did not have and I called back and left d/t's on vm - pt aware. S/w Clydie Braun in radonc re appt w/Dr. Michell Heinrich.

## 2012-12-13 ENCOUNTER — Inpatient Hospital Stay (HOSPITAL_COMMUNITY)
Admission: AD | Admit: 2012-12-13 | Discharge: 2012-12-15 | DRG: 947 | Disposition: A | Discharge status: Hospice | Source: Ambulatory Visit | Attending: Oncology | Admitting: Oncology

## 2012-12-13 ENCOUNTER — Other Ambulatory Visit: Payer: Self-pay | Admitting: *Deleted

## 2012-12-13 ENCOUNTER — Other Ambulatory Visit: Payer: Self-pay | Admitting: Oncology

## 2012-12-13 ENCOUNTER — Encounter (HOSPITAL_COMMUNITY): Payer: Self-pay | Admitting: *Deleted

## 2012-12-13 DIAGNOSIS — C7951 Secondary malignant neoplasm of bone: Secondary | ICD-10-CM | POA: Diagnosis present

## 2012-12-13 DIAGNOSIS — C50919 Malignant neoplasm of unspecified site of unspecified female breast: Secondary | ICD-10-CM

## 2012-12-13 DIAGNOSIS — G893 Neoplasm related pain (acute) (chronic): Principal | ICD-10-CM | POA: Diagnosis present

## 2012-12-13 DIAGNOSIS — D649 Anemia, unspecified: Secondary | ICD-10-CM

## 2012-12-13 DIAGNOSIS — R109 Unspecified abdominal pain: Secondary | ICD-10-CM | POA: Diagnosis present

## 2012-12-13 DIAGNOSIS — K219 Gastro-esophageal reflux disease without esophagitis: Secondary | ICD-10-CM

## 2012-12-13 DIAGNOSIS — I10 Essential (primary) hypertension: Secondary | ICD-10-CM | POA: Diagnosis present

## 2012-12-13 DIAGNOSIS — R188 Other ascites: Secondary | ICD-10-CM | POA: Diagnosis present

## 2012-12-13 DIAGNOSIS — C7931 Secondary malignant neoplasm of brain: Secondary | ICD-10-CM | POA: Diagnosis present

## 2012-12-13 DIAGNOSIS — E43 Unspecified severe protein-calorie malnutrition: Secondary | ICD-10-CM | POA: Diagnosis present

## 2012-12-13 DIAGNOSIS — Z9010 Acquired absence of unspecified breast and nipple: Secondary | ICD-10-CM

## 2012-12-13 DIAGNOSIS — Z87891 Personal history of nicotine dependence: Secondary | ICD-10-CM

## 2012-12-13 DIAGNOSIS — H02409 Unspecified ptosis of unspecified eyelid: Secondary | ICD-10-CM | POA: Diagnosis present

## 2012-12-13 DIAGNOSIS — Z515 Encounter for palliative care: Secondary | ICD-10-CM

## 2012-12-13 DIAGNOSIS — C787 Secondary malignant neoplasm of liver and intrahepatic bile duct: Secondary | ICD-10-CM | POA: Diagnosis present

## 2012-12-13 DIAGNOSIS — K5900 Constipation, unspecified: Secondary | ICD-10-CM | POA: Diagnosis present

## 2012-12-13 DIAGNOSIS — Z9221 Personal history of antineoplastic chemotherapy: Secondary | ICD-10-CM

## 2012-12-13 DIAGNOSIS — E860 Dehydration: Secondary | ICD-10-CM

## 2012-12-13 DIAGNOSIS — M549 Dorsalgia, unspecified: Secondary | ICD-10-CM | POA: Diagnosis present

## 2012-12-13 DIAGNOSIS — Z800 Family history of malignant neoplasm of digestive organs: Secondary | ICD-10-CM

## 2012-12-13 DIAGNOSIS — Z66 Do not resuscitate: Secondary | ICD-10-CM | POA: Diagnosis present

## 2012-12-13 DIAGNOSIS — C719 Malignant neoplasm of brain, unspecified: Secondary | ICD-10-CM

## 2012-12-13 DIAGNOSIS — Z923 Personal history of irradiation: Secondary | ICD-10-CM

## 2012-12-13 LAB — COMPREHENSIVE METABOLIC PANEL
Alkaline Phosphatase: 841 U/L — ABNORMAL HIGH (ref 39–117)
BUN: 18 mg/dL (ref 6–23)
CO2: 22 mEq/L (ref 19–32)
Chloride: 102 mEq/L (ref 96–112)
GFR calc Af Amer: >90 mL/min (ref >90)
GFR calc non Af Amer: >90 mL/min (ref >90)
Glucose, Bld: 114 mg/dL — ABNORMAL HIGH (ref 70–99)
Potassium: 3.7 mEq/L (ref 3.5–5.1)
Total Bilirubin: 0.4 mg/dL (ref 0.3–1.2)

## 2012-12-13 LAB — CBC
MCH: 26.1 pg (ref 26.0–34.0)
MCHC: 32.3 g/dL (ref 30.0–36.0)
Platelets: 366 10*3/uL (ref 150–400)
RDW: 19.7 % — ABNORMAL HIGH (ref 11.5–15.5)

## 2012-12-13 MED ORDER — CYCLOBENZAPRINE HCL 10 MG PO TABS
10.0000 mg | ORAL_TABLET | Freq: Three times a day (TID) | ORAL | Status: DC | PRN
  Administered 2012-12-15: 10 mg via ORAL
  Filled 2012-12-13: qty 1

## 2012-12-13 MED ORDER — EXEMESTANE 25 MG PO TABS
25.0000 mg | ORAL_TABLET | Freq: Every day | ORAL | Status: DC
  Administered 2012-12-14 – 2012-12-15 (×2): 25 mg via ORAL
  Filled 2012-12-13 (×3): qty 1

## 2012-12-13 MED ORDER — MORPHINE BOLUS VIA INFUSION
2.0000 mg | INTRAVENOUS | Status: DC | PRN
  Administered 2012-12-13 – 2012-12-14 (×6): 2 mg via INTRAVENOUS
  Filled 2012-12-13: qty 4

## 2012-12-13 MED ORDER — POLYETHYLENE GLYCOL 3350 17 G PO PACK
17.0000 g | PACK | Freq: Every day | ORAL | Status: DC
  Administered 2012-12-15: 17 g via ORAL
  Filled 2012-12-13 (×3): qty 1

## 2012-12-13 MED ORDER — ZOLPIDEM TARTRATE 5 MG PO TABS
5.0000 mg | ORAL_TABLET | Freq: Every evening | ORAL | Status: DC | PRN
  Administered 2012-12-13 – 2012-12-14 (×2): 5 mg via ORAL
  Filled 2012-12-13 (×2): qty 1

## 2012-12-13 MED ORDER — DOCUSATE SODIUM 100 MG PO CAPS
200.0000 mg | ORAL_CAPSULE | Freq: Two times a day (BID) | ORAL | Status: DC
  Administered 2012-12-14 – 2012-12-15 (×2): 200 mg via ORAL
  Filled 2012-12-13 (×5): qty 2

## 2012-12-13 MED ORDER — PROMETHAZINE HCL 25 MG PO TABS
25.0000 mg | ORAL_TABLET | Freq: Four times a day (QID) | ORAL | Status: DC | PRN
  Administered 2012-12-13 – 2012-12-14 (×2): 25 mg via ORAL
  Filled 2012-12-13 (×2): qty 1

## 2012-12-13 MED ORDER — MORPHINE SULFATE 25 MG/ML IV SOLN
5.0000 mg/h | INTRAVENOUS | Status: DC
  Administered 2012-12-13 – 2012-12-14 (×2): 5 mg/h via INTRAVENOUS
  Filled 2012-12-13 (×3): qty 10

## 2012-12-13 MED ORDER — LORAZEPAM 1 MG PO TABS: 1.0000 mg | ORAL_TABLET | Freq: Four times a day (QID) | ORAL | Status: DC | PRN

## 2012-12-13 MED ORDER — ENSURE COMPLETE PO LIQD
237.0000 mL | Freq: Four times a day (QID) | ORAL | Status: DC
  Administered 2012-12-13 – 2012-12-15 (×7): 237 mL via ORAL

## 2012-12-13 MED ORDER — ONDANSETRON 4 MG PO TBDP
4.0000 mg | ORAL_TABLET | Freq: Three times a day (TID) | ORAL | Status: DC | PRN
  Filled 2012-12-13: qty 1

## 2012-12-13 MED ORDER — MORPHINE SULFATE 2 MG/ML IJ SOLN: 2.0000 mg | INTRAMUSCULAR | Status: DC | PRN

## 2012-12-13 MED ORDER — SODIUM CHLORIDE 0.9 % IV SOLN
INTRAVENOUS | Status: DC
  Administered 2012-12-13 – 2012-12-15 (×3): via INTRAVENOUS

## 2012-12-13 MED ORDER — ENOXAPARIN SODIUM 40 MG/0.4ML ~~LOC~~ SOLN
40.0000 mg | SUBCUTANEOUS | Status: DC
  Administered 2012-12-13 – 2012-12-14 (×2): 40 mg via SUBCUTANEOUS
  Filled 2012-12-13 (×3): qty 0.4

## 2012-12-13 MED ORDER — DEXAMETHASONE 4 MG PO TABS
8.0000 mg | ORAL_TABLET | Freq: Two times a day (BID) | ORAL | Status: DC
  Administered 2012-12-14 – 2012-12-15 (×3): 8 mg via ORAL
  Filled 2012-12-13 (×5): qty 2

## 2012-12-13 MED ORDER — PANTOPRAZOLE SODIUM 40 MG PO TBEC
40.0000 mg | DELAYED_RELEASE_TABLET | Freq: Every day | ORAL | Status: DC
  Administered 2012-12-13 – 2012-12-15 (×3): 40 mg via ORAL
  Filled 2012-12-13 (×3): qty 1

## 2012-12-13 NOTE — H&P (Signed)
ID: [name]   DOB: [birth year]  MR#: [id number]  NFA#[id number]    PCP:.Pcp Not In System  HISTORY OF PRESENT ILLNESS: The patient delivered a child on 05/17/2009, after which she felt fullness in the right breast. She eventually brought this to her doctors attention and was scheduled for mammography and ultrasound on 03/08/2009. There were no prior exams for comparison. Mammography showed the breast to be dense, with a 5 cm obscured mass in the outer midportion of the right breast. Mass was easily palpable, and there was also a mass in the outer right periareolar region. They were suspicious microcalcifications in the outer midportion of the right breast, as well as within the mass itself. Ultrasound showed an extensive heterogeneous area, corresponding to the palpable abnormality with multiple solid and cystic foci. Overall, the mass measured in excess of 5 cm, in addition to cysts in the breast.   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 The tumors were ER +100%, PR +80%.   Bilateral breast MRI was obtained 03/22/2009. There was a large area of abnormal enhancement in the lower outer quadrant of the right breast measuring up to 9.7 cm. No abnormal appearing lymph nodes and no other masses in either breast.   Patient underwent definitive right mastectomy and sentinel lymph node sampling on 04/29/2009 under the care of Dr. Derrell Lolling. 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 Repeat prognostic panel showed tumor to be ER +93% and PR +51%. HER-2/neu was negative with a ratio of 0.71, with proliferation marker of 36%.   Patient was evaluated by Dr. Darnelle Catalan in January 2011 but failed to keep appointments until she was seen here again in July 2011. In July, she began adjuvant chemotherapy. She  received 4 cycles of dose dense doxorubicin and cyclophosphamide followed by 4 weekly doses of paclitaxel which was discontinued in November 2011 secondary to peripheral neuropathy. The patient then received radiation therapy to the right chest wall which was truncated due to problems with missing appointments.   Patient was started on tamoxifen briefly in June of 2012 which was discontinued due to nausea. The nausea continued, and an abdominal CT in October 2012 confirmed a growth in liver lesions. These were biopsied on 03/10/2011 and showed metastatic adenocarcinoma, still ER positive at 100%, PR +5%. Her subsequent treatments are as detailed below   INTERVAL HISTORY: [name] was admitted to [facility] June of 2014, where her pain was stabilized, she was hydrated, and clinically she made a wonderful improvement. She opted to move out to her own apartment approximately 2 weeks prior to this admission. We have been monitoring the situation at home with the help of hospice area at over the last 10 days her pain has become more difficult to manage, or ascites is increased, and she has become increasingly unable to care for herself. She is being admitted now with consideration of possible return to [facility].   REVIEW OF SYSTEMS: Solace is unable to give me a detailed review of systems tonight--she is able to tell me her pain is controlled on her current morphine dose so long as the bed is flat. She is having significant nausea. She doesn't answer when she had her last BM. No family in room   PAST MEDICAL HISTORY: Past Medical History   Diagnosis  Date   .  Hypertension     .  GERD (gastroesophageal reflux disease)     .  Bone metastases  2012       "spread from my breast"   .  Pyelonephritis  10/01/11   .  Recurrent UTI (urinary tract infection)  10/01/11   .  S/P chemotherapy, time since 4-12 weeks     .  S/P radiation > 12 weeks  03/15/12- 04/07/12       right hip, clivus   .  Breast cancer  metastasized to liver  03/21/2011       bone and brain   .  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  Hypercalcemia  06/21/2012   .  History of radiation therapy  08/06/10 -09/16/10       R supraclav fossa, R chest wall, R drain site   .  radiaiton therapy  07/22/12 -08/15/12       T4-T7     PAST SURGICAL HISTORY: Past Surgical History   Procedure  Laterality  Date   .  Cesarean section    1989; 2010   .  Mastectomy    2011       right   .  Back surgery    2012       "removed tumor,cancer, from my spine   .  Port-a-cath removal    2012       right chest   .  Portacath placement    2011       right   .  Tubal ligation    2010   .  Breast biopsy    2011       right   .  Hip arthroplasty    05/04/2012       Procedure: ARTHROPLASTY BIPOLAR HIP;  Surgeon: Shelda Pal, MD;  Location: [facility];  Service: Orthopedics;  Laterality: Right;  RIGHT HIP HEMI ARTHROPLASTY      FAMILY HISTORY The patient's father is alive in his late 69s.  The patient's mother is alive also in her 38s.  The patient has four sisters.  She had one brother who died from colon cancer at the age of 57.  There is no breast or ovarian cancer history in the family to her knowledge.   GYNECOLOGIC HISTORY: She is GX P2.  First pregnancy to term at age 34.     SOCIAL HISTORY: The patient is not employed.  She lives by herself (her small son whose name is [name] lives with his dad in [place]).  Her first child, [name], is studying business in this area.  The patient has one grandchild.  She attends the [employer].               ADVANCED DIRECTIVES: Per prior discussion a DO NOT RESUSCITATE order is in place   HEALTH MAINTENANCE: History   Substance Use Topics   .  Smoking status:  Former Smoker -- 0.25 packs/day for 31 years       Types:  Cigarettes       Quit date:  04/26/2012   .  Smokeless tobacco:  Never Used   .  Alcohol Use:  No    No Known Allergies    Current Outpatient Prescriptions    Medication  Sig  Dispense  Refill   .  cyclobenzaprine (FLEXERIL) 10 MG tablet  Take 1 tablet (10 mg total) by mouth 3 (three) times  daily as needed for muscle spasms.   30 tablet   1   .  dexamethasone (DECADRON) 4 MG tablet  Take 2 tablets (8 mg total) by mouth every 12 (twelve) hours.   60 tablet   0   .  everolimus (AFINITOR) 5 MG tablet  Take 5 mg by mouth daily.         [name]  exemestane (AROMASIN) 25 MG tablet  Take 25 mg by mouth daily after breakfast.         .  feeding supplement (ENSURE COMPLETE) LIQD  Take 237 mLs by mouth 2 (two) times daily between meals.   24 Bottle   11   .  ferrous sulfate 325 (65 FE) MG tablet  Take 325 mg by mouth 3 (three) times daily after meals.         .  fluconazole (DIFLUCAN) 100 MG tablet  Take 1 tablet (100 mg total) by mouth daily.   301 tablet   1   .  ondansetron (ZOFRAN) 4 MG tablet  Take 1 tablet (4 mg total) by mouth every 6 (six) hours.   30 tablet   1   .  OxyCODONE HCl ER 60 MG T12A  Take 1 tablet by mouth 2 (two) times daily.   14 each   0   .  oxyCODONE-acetaminophen (PERCOCET) 10-325 MG per tablet  Take 1-2 tablets by mouth every 6 (six) hours as needed for pain.   56 tablet   0   .  pantoprazole (PROTONIX) 40 MG tablet  Take 1 tablet (40 mg total) by mouth daily.   30 tablet   3   .  zolpidem (AMBIEN) 5 MG tablet  Take 1 tablet (5 mg total) by mouth at bedtime as needed for sleep.   30 tablet   1   .  [DISCONTINUED] Alum & Mag Hydroxide-Simeth (MAGIC MOUTHWASH W/LIDOCAINE) SOLN  Take 5 mLs by mouth 3 (three) times daily as needed (take 15 minutes prior to meals.).   480 mL   0      Current facility-administered medications:0.9 %  sodium chloride infusion, , Intravenous, Continuous, Lowella Dell, MD, Last Rate: 100 mL/hr at 12/13/12 1739;  cyclobenzaprine (FLEXERIL) tablet 10 mg, 10 mg, Oral, TID PRN, Lowella Dell, MD;  Melene Muller ON 12/14/2012] dexamethasone (DECADRON) tablet 8 mg, 8 mg, Oral, BID WC, Lowella Dell, MD;  docusate sodium  (COLACE) capsule 200 mg, 200 mg, Oral, BID, Lowella Dell, MD enoxaparin (LOVENOX) injection 40 mg, 40 mg, Subcutaneous, Q24H, Lowella Dell, MD;  Melene Muller ON 12/14/2012] exemestane (AROMASIN) tablet 25 mg, 25 mg, Oral, QPC breakfast, Lowella Dell, MD;  feeding supplement (ENSURE COMPLETE) liquid 237 mL, 237 mL, Oral, QID, Lowella Dell, MD, 237 mL at 12/13/12 1830;  LORazepam (ATIVAN) tablet 1 mg, 1 mg, Oral, Q6H PRN, Lowella Dell, MD morphine 1 mg/mL in dextrose 5 % 250 mL infusion, 5 mg/hr, Intravenous, Continuous, Lowella Dell, MD, Last Rate: 5 mL/hr at 12/13/12 1739, 5 mg/hr at 12/13/12 1739;  morphine bolus via infusion 2-4 mg, 2-4 mg, Intravenous, Q1H PRN, Lowella Dell, MD;  ondansetron (ZOFRAN-ODT) disintegrating tablet 4 mg, 4 mg, Oral, Q8H PRN, Lowella Dell, MD;  pantoprazole (PROTONIX) EC tablet 40 mg, 40 mg, Oral, Daily, Lowella Dell, MD polyethylene glycol (MIRALAX / GLYCOLAX) packet 17 g, 17 g, Oral, Daily, Lowella Dell, MD;  promethazine (PHENERGAN) tablet 25 mg, 25 mg, Oral, Q6H PRN,  Lowella Dell, MD, 25 mg at 12/13/12 1737;  zolpidem (AMBIEN) tablet 5 mg, 5 mg, Oral, QHS PRN, Lowella Dell, MD    OBJECTIVE: middle aged African American woman examined in bed Filed Vitals:   12/13/12 1724  BP: 165/85  Pulse: 84  Temp: 98.2 F (36.8 C)  Resp: 16   Oropharynx clear, dry No cervical or supraclavicular adenopathy Lungs shows no rales or rhonchi, auscultated anterolaterally Heart regular rate and rhythm Abdomen is distended, firm, not tender to mild palpation, positive BS No lower extremity edema noted Neuro: motor is nonfocal; has a L ptosis but full EOMs; knows place and person Breasts: Deferred   LAB RESULTS: Pending  STUDIES: Mr [name] Wo Contrast  2012/12/09   *RADIOLOGY REPORT*  Clinical Data: Metastatic breast cancer.  Visual changes.  MRI HEAD WITHOUT AND WITH CONTRAST  Technique:  Multiplanar, multiecho pulse  sequences of the brain and surrounding structures were obtained according to standard protocol without and with intravenous contrast  Contrast: 10mL MULTIHANCE GADOBENATE DIMEGLUMINE 529 MG/ML IV SOLN  Comparison: CT 06/15/2012.  MRI 02/20/2012  Findings: There is suspicion of metastatic disease in the C3 vertebral body, with progression from the prior MRI.  Some of the images are degraded by motion especially the axial T1 weighted sequence prior to contrast and the postcontrast images.  The mass lesion in the central skull base has improved.  This mass filled the clivus on the prior study.  There currently is abnormal low signal on T1 in the clivus which shows enhancement.  It is difficult to  determine if this is  residual tumor or treated tumor.  Given the enhancement, continued follow-up is suggested. There was extension of tumor into the left cavernous sinus on the prior study which appears to have resolved.  The pituitary is uplifted by tumor in the floor of the sella on the left.  There is no compression of the optic chiasm.  No other bony lesions are identified.  No enhancing lesions are present in the brain.  Ventricle size is normal.  No acute infarct.  Small white matter hyperintensities are unchanged and appear chronic.  No hemorrhage is identified.  IMPRESSION: The mass in the clivus has improved following treatment compared with the MRI 02/20/2012.  There remains abnormal enhancement within the clivus which could be residual tumor or treated tumor. Improvement in tumor extension into the sella and left cavernous sinus.  No intracranial metastatic deposits.  Probable progression of metastatic disease in the C3 vertebral body.   Original Report Authenticated By: Janeece Riggers, M.D.   Ir Fluoro Guide Cv Line Left  11/14/2012   *RADIOLOGY REPORT*  Clinical Data: The patient metastatic breast cancer.  On IV morphine pump for chronic pain control request for PICC line placement.  PICC LINE PLACEMENT WITH  ULTRASOUND AND FLUOROSCOPIC  GUIDANCE  Fluoroscopy Time: 0 minutes, 12 seconds  The left arm was prepped with chlorhexidine, draped in the usual sterile fashion using maximum barrier technique (cap and mask, sterile gown, sterile gloves, large sterile sheet, hand hygiene and cutaneous antisepsis) and infiltrated locally with 1% Lidocaine.  Ultrasound demonstrated patency of the left brachial vein, and this was documented with an image.  Under real-time ultrasound guidance, this vein was accessed with a 21 gauge micropuncture needle and image documentation was performed.  The needle was exchanged over a guidewire for a peel-away sheath through which a five [place] dual lumen PICC trimmed to 47 cm was advanced, positioned with its  tip at the lower SVC/right atrial junction.  Fluoroscopy during the procedure and fluoro spot radiograph confirms appropriate catheter position.  The catheter was flushed, secured to the skin with Prolene sutures, and covered with a sterile dressing.  Complications:  None  IMPRESSION: Successful left arm PICC line placement with ultrasound and fluoroscopic guidance.  The catheter is ready for use.  Read by Brayton El PA-C   Original Report Authenticated By: Irish Lack, M.D.   Ir US Guide Vasc Access Left  11/14/2012   *RADIOLOGY REPORT*  Clinical Data: The patient metastatic breast cancer.  On IV morphine pump for chronic pain control request for PICC line placement.  PICC LINE PLACEMENT WITH ULTRASOUND AND FLUOROSCOPIC  GUIDANCE  Fluoroscopy Time: 0 minutes, 12 seconds  The left arm was prepped with chlorhexidine, draped in the usual sterile fashion using maximum barrier technique (cap and mask, sterile gown, sterile gloves, large sterile sheet, hand hygiene and cutaneous antisepsis) and infiltrated locally with 1% Lidocaine.  Ultrasound demonstrated patency of the left brachial vein, and this was documented with an image.  Under real-time ultrasound guidance, this vein was  accessed with a 21 gauge micropuncture needle and image documentation was performed.  The needle was exchanged over a guidewire for a peel-away sheath through which a five [place] dual lumen PICC trimmed to 47 cm was advanced, positioned with its tip at the lower SVC/right atrial junction.  Fluoroscopy during the procedure and fluoro spot radiograph confirms appropriate catheter position.  The catheter was flushed, secured to the skin with Prolene sutures, and covered with a sterile dressing.  Complications:  None  IMPRESSION: Successful left arm PICC line placement with ultrasound and fluoroscopic guidance.  The catheter is ready for use.  Read by Brayton El PA-C   Original Report Authenticated By: Irish Lack, M.D.   Dg Abd Acute W/chest  12/10/2012   *RADIOLOGY REPORT*  Clinical Data: Abdominal pain.  Metastatic breast carcinoma.  ACUTE ABDOMEN SERIES (ABDOMEN 2 VIEW & CHEST 1 VIEW)  Comparison: 11/07/2012  Findings: No evidence of dilated bowel loops.  No evidence of free air.  Diffuse hepatomegaly seen displacing bowel loops inferiorly.  Left arm PICC line is seen in appropriate position.  Both lungs are clear.  Heart size within normal limits.  IMPRESSION:  1.  Unremarkable bowel gas pattern. 2.  Hepatomegaly. 3.  No active lung disease.   Original Report Authenticated By: Myles Rosenthal, M.D.     ASSESSMENT:    47 y.o.  [place] woman     1. 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   2.Status post adjuvant chemotherapy July to November 2011, consisting of 4 cycles of dose dense doxorubicin and cyclophosphamide, followed by 4 of 12 planned weekly doses of paclitaxel given adjuvantly, discontinued secondary to peripheral neuropathy.   3. Status post radiation to the right chest wall, truncated due to problems  with the patient missing appointments.  4. Tamoxifen started June of 2012, but discontinued due to nausea.   5. metastatic disease to liver and bone pathologically documented October of 2012, estrogen receptor 100% positive, progesterone receptor 7% positive, with no HER-2 amplification;  treatment in the metastatic setting has consisted of:  6.Faslodex started 03/16/2011, discontinued November 2013 with progression.   7. Zoladex every 3 months started on 03/16/2011, and monthly zoledronic acid started November 2011,  the Zometa afterwards held due to pending dental procedures and possible extractions.  8. Status post thoracic laminectomy, T9-T11, for impending cord compression on 03/21/2011, followed by irradiation to the spine completed November 2011   9. 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  Progression noted by scans in November 2013, at which time Faslodex was discontinued. Patient was started on exemestane in early December, with everolimus started in mid December at 5 mg daily.    11. Nondisplaced right femoral neck fracture, status post right hip arthroplasty on 05/04/2012 [facility].  12. Hypercalcemia with elevated serum creatinine: resolved  13.  s/p palliative radiation therapy to T4-T7 for cord compression completed 08/15/2012  14. Poorly controlled pain, recurrent ascites, severe malnutrition, nausea, vomiting, constipation   PLAN:   We are admitting [place] to bring her pain under control, consider paracentesis, hydrate her, and reassess whether she can be made stable enough to Mullins home or whether she will need to Mullins back to [facility]. As per prior discussions, the patient is not to be resuscitated in case of a terminal event.

## 2012-12-13 NOTE — Progress Notes (Signed)
Patient is too sedated to be able to sign up for My Chart. Briscoe Burns BSN, RN-BC Admissions RN  12/13/2012 5:25 PM

## 2012-12-13 NOTE — Progress Notes (Signed)
Patient disconnected from CAD pump and hooked up to NS infusion and continuous morphine gtt per MD order. 250 mL morphine wasted with second RN, Francie Massing. CAD pump placed at patient beside. Angelena Form, RN

## 2012-12-14 ENCOUNTER — Other Ambulatory Visit: Payer: Self-pay | Admitting: Radiation Oncology

## 2012-12-14 DIAGNOSIS — C7951 Secondary malignant neoplasm of bone: Secondary | ICD-10-CM

## 2012-12-14 LAB — URINALYSIS, ROUTINE W REFLEX MICROSCOPIC
Ketones, ur: NEGATIVE mg/dL
Leukocytes, UA: NEGATIVE
Nitrite: NEGATIVE
Protein, ur: NEGATIVE mg/dL

## 2012-12-14 NOTE — Progress Notes (Signed)
Referral received that pt from home with hospice and will likely d/c back home with hospice on 7/17.  CSW notified RNCM.  CSW to follow for likely ambulance transportation for pt to home at discharge.  Jacklynn Lewis, MSW, LCSWA  Clinical Social Work [phone]

## 2012-12-14 NOTE — Progress Notes (Signed)
 morphine wasted in sink and in pyxis with second RN due to 24 hour expiration time. Angelena Form, RN

## 2012-12-14 NOTE — Progress Notes (Signed)
Eye patch applied to patient's L eye per MD order. Extra patches placed at patient's bedside. Angelena Form, RN

## 2012-12-14 NOTE — Progress Notes (Signed)
Rm: [facility] [phone]  Patient (pt):[facility]  Hospice Home Care Chaplain  Hospice home care chaplain visited to assess for spiritual needs.  Pt was talking on the phone with her small son on chaplain's entry, with no family at bedside.  Pt told chaplain about her children and grandchildren, speaking to the important place that family holds in her life.  She indicated she was feeling better on this date and was looking forward to going home tomorrow.  Pt indicated she receives good support from her pastor and church.  Chaplain provided pastoral presence and assurance of prayer.  Chaplain will continue to offer ongoing spiritual support.  Beverly Isley-Landreth ThM, BCCC [facility] Clinical Chaplain

## 2012-12-14 NOTE — Progress Notes (Addendum)
Room [facility] 1309 - [name] - [facility] RN Visit-R.Xaiver Roskelley RN  Related admission to [facility] diagnosis of metastatic breast cancer.  Pt is DNR code with OOF DNR on shadow chart.  Pt alert & oriented, lying in bed, with complaints of pain @ 7/10 - MS continuous @ 5mg /hr - pt given 4 bolus doses MS since 7pm 7/15 through 7am today.  No family present.   [facility] SW present at visit.  Patient's home medication list is on shadow chart.   Pt shares she was having N&V, diarrhea x a couple days prior to admission.  States the diarrhea is better, soft stool today, N&V resolved but pain is still an issue - pt states pain in upper epigastric area - upper mid abdomen.  Abd tight, no edema in BLE.  Per notes in chart and per pt, may be discharged back home tomorrow 7/17.    Pt entered hospital with CADD pump from [facility].  This RN has possession of CADD pump and will return  to re-attach at discharge.  [facility] will need new prescription for continuous pain meds at discharge - usually 4 hr turnaround for med to be delivered and attached prior to discharge.   Discussed with CM RN Tameeka.  Please call [facility] @ [phone]- ask for RN Liaison or after hours,ask for on-call RN with any hospice needs.   Thank you.  Joneen Boers, RN  [facility]  Hospice Liaison

## 2012-12-14 NOTE — Progress Notes (Signed)
INITIAL NUTRITION ASSESSMENT  Pt meets criteria for severe MALNUTRITION in the context of chronic illness as evidenced by <75% estimated energy intake for the past few months in addition to pt with at least 8.4% weight loss in the past 4 months. Pt with mild/moderate muscle wasting and subcutaneous fat loss in clavicles, temporal, and orbital region.  DOCUMENTATION CODES Per approved criteria  -Severe malnutrition in the context of chronic illness   INTERVENTION: - Ensure Complete TID. Provided pt with coupons for Ensure per her request.  - Will continue to monitor   NUTRITION DIAGNOSIS: Increased nutrient needs related to metastatic breast CA as evidenced by MD notes.   Goal: Nutrition per pt wishes/comfort feeds as pt under hospice care  Monitor:  Weights, labs, intake  Reason for Assessment: Nutrition risk   47 y.o. female  Admitting Dx: Poor controlled pain with metastatic breast CA  ASSESSMENT: Pt with metastatic breast CA, invasive ductal CA s/p chemoradiation. Pt followed by hospice. Met with pt who reports eating whenever she wanted to PTA. Pt's weight up 14 pounds since last month likely from ascites. Pt reports she has been drinking a lot of Ensure at home. Pt with elevated Alk phos, AST, ALT. Noted pt with mild/moderate muscle wasting and subcutaneous fat loss in clavicles, temporal, and orbital region. Pt has been followed by inpatient RDs in past admissions - noted pt with <75% estimated energy intake for the past few months.    Height: Ht Readings from Last 1 Encounters:  12/13/12 5\' 2"  (1.575 m)    Weight: 7/16 119 lb per nurse tech  Ideal Body Weight: 110 lb  % Ideal Body Weight: 108%  Wt Readings from Last 10 Encounters:  11/07/12 105 lb 3.2 oz (47.718 kg)  10/25/12 106 lb 7.7 oz (48.3 kg)  10/16/12 113 lb (51.256 kg)  10/12/12 117 lb 8 oz (53.298 kg)  10/04/12 113 lb (51.256 kg)  09/27/12 115 lb 8 oz (52.39 kg)  09/05/12 119 lb 6.4 oz (54.159 kg)   08/22/12 124 lb 12.8 oz (56.609 kg)  08/06/12 130 lb 11.7 oz (59.3 kg)  08/09/12 129 lb 4.8 oz (58.65 kg)    Usual Body Weight: 130 lb March 2014  % Usual Body Weight: 91%  BMI:  21.8 kg/(m^2)  Estimated Nutritional Needs: Kcal: 1350-1650 Protein: 55-65g Fluid: 1.3-1.6L/day  Skin: Intact  Diet Order: General  EDUCATION NEEDS: -No education needs identified at this time   Intake/Output Summary (Last 24 hours) at 12/14/12 1459 Last data filed at 12/14/12 1300  Gross per 24 hour  Intake    960 ml  Output    300 ml  Net    660 ml    Last BM: 7/16   Labs:   Recent Labs Lab 12/10/12 1523 12/13/12 1857  NA 139 138  K 3.3* 3.7  CL 102 102  CO2 23 22  BUN 19 18  CREATININE 0.66 0.64  CALCIUM 9.3 9.2  GLUCOSE 106* 114*    CBG (last 3)  No results found for this basename: GLUCAP,  in the last 72 hours  Scheduled Meds: . dexamethasone  8 mg Oral BID WC  . docusate sodium  200 mg Oral BID  . enoxaparin (LOVENOX) injection  40 mg Subcutaneous Q24H  . exemestane  25 mg Oral QPC breakfast  . feeding supplement  237 mL Oral QID  . pantoprazole  40 mg Oral Daily  . polyethylene glycol  17 g Oral Daily    Continuous Infusions: .  sodium chloride 100 mL/hr at 12/14/12 1203  . morphine 1 mg/ml infusion 5 mg/hr (12/13/12 1739)    Past Medical History  Diagnosis Date  . Hypertension   . GERD (gastroesophageal reflux disease)   . 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 Pyelonephritis 10/01/11  . Recurrent UTI (urinary tract infection) 10/01/11  . S/P chemotherapy, time since 4-12 weeks   . S/P radiation > 12 weeks 03/15/12- 04/07/12    right hip, clivus  . 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 Brain cancer     spread from breast ca  . Hypercalcemia 06/21/2012  . History of radiation therapy 08/06/10 -09/16/10    R supraclav fossa, R chest wall, R drain site  . radiaiton therapy 07/22/12 -08/15/12    T4-T7    Past  Surgical History  Procedure Laterality Date  . Cesarean section  1989; 2010  . Mastectomy  2011    right  . Back surgery  2012    "removed tumor,cancer, from my spine  . Port-a-cath removal  2012    right chest  . Portacath placement  2011    right  . Tubal ligation  2010  . Breast biopsy  2011    right  . Hip arthroplasty  05/04/2012    Procedure: ARTHROPLASTY BIPOLAR HIP;  Surgeon: Shelda Pal, MD;  Location: [facility];  Service: Orthopedics;  Laterality: Right;  RIGHT HIP HEMI ARTHROPLASTY     Levon Hedger MS, RD, LDN [phone] Pager [phone] After Hours Pager

## 2012-12-14 NOTE — Care Management Note (Signed)
CARE MANAGEMENT NOTE 12/14/2012  Patient:  [name], [name]   Account Number:  [id number]  Date Initiated:  12/14/2012  Documentation initiated by:  Daesean Lazarz  Subjective/Objective Assessment:   47 yo female admitted with abd pain & diarrhea. Hx of breast Ca with mets. Pt from home with [facility].     Action/Plan:   Home with Hospice   Anticipated DC Date:  12/15/2012   Anticipated DC Plan:  HOME W HOSPICE CARE  In-house referral  Clinical Social Worker      DC Planning Services  CM consult      PAC Choice  HOSPICE   Choice offered to / List presented to:  NA   DME arranged  NA      DME agency  NA     HH arranged  NA      Status of service:  In process, will continue to follow Medicare Important Message given?   (If response is "NO", the following Medicare IM given date fields will be blank) Date Medicare IM given:   Date Additional Medicare IM given:    Discharge Disposition:    Per UR Regulation:  Reviewed for med. necessity/level of care/duration of stay  If discussed at Long Length of Stay Meetings, dates discussed:    Comments:  12/14/12 1416 Doristine Shehan,RN,BSN [phone] Pt disposition home with hospice services. Pt currently followed by [facility]. CM spoke with [facility] liason Rose who request MD rxr for morphine gtt prior to pt's discharge. Cm informed to notifiy [facility] when rx signed by MD. [facility] liason Rose has pt's pump for continous pain med in her posession. Pt will require [facility] RN to resume continous medication pump prior to discharge. CSW informed pt will require tx to residence upon discharge.

## 2012-12-14 NOTE — Progress Notes (Signed)
[facility] SW note Patient was lying in bed watching tv upon arrival with liaison RN, Rose. Patient reports feeling much better with pain and diarrhea. She voices confusion with why she feels bad one day and good the next. She shared a hope to feel as good as she can every day. Patient has moved into her new apartment and has multiple tasks that she is involved in with getting it furnished, caring for her 48 year old son. She has support from her cousin who is staying with her. She shared a plan to return home tomorrow yet hopes to have sx resolved and some understanding of plan, if any regarding her eye. Support, encouragement offered and f/u upon return home. Orson Gear, [place] [phone]

## 2012-12-14 NOTE — Progress Notes (Signed)
[name]   DOB:1965/12/17   ZO#:[id number]   JWJ#:[id number]  Subjective: [name] is feeling better this AM; she tells me she did go to her new apt after leaving [employer], that her daughter is not living with her (as originally planned), but that a cousin in his mid-30's is living there now with her, coming from [place], not employed, and he is "helping as best he can." She does not want to go back to [employer] "until I am bed bound." -- Had BM this AM. Pain is controlled on current meds. No family in room   Objective: middle aged [place] American woman examined in bed Filed Vitals:   12/14/12 0602  BP: 152/65  Pulse: 80  Temp: 98.2 F (36.8 C)  Resp: 20    There is no weight on file to calculate BMI.  Intake/Output Summary (Last 24 hours) at 12/14/12 0845 Last data filed at 12/14/12 0703  Gross per 24 hour  Intake    720 ml  Output      0 ml  Net    720 ml     Sclerae unicteric, stable Left lid lag and slight Left exophthamus but no nystagmus and good EOMs  Oropharynx clear  No peripheral adenopathy  Lungs clear -- no rales or rhonchi, auscultated anteriorly  Heart regular rate and rhythm  Abdomen firm, distended, nontender to mild palpation, +BS  MSK  no peripheral edema  Neuro nonfocal  Breast exam: deferred  CBG (last 3)  No results found for this basename: GLUCAP,  in the last 72 hours   Labs:  Lab Results  Component Value Date   WBC 8.4 12/13/2012   HGB 9.4* 12/13/2012   HCT 29.1* 12/13/2012   MCV 80.8 12/13/2012   PLT 366 12/13/2012   NEUTROABS 7.2* 10/31/2012    @LASTCHEMISTRY @  Urine Studies No results found for this basename: UACOL, UAPR, USPG, UPH, UTP, UGL, UKET, UBIL, UHGB, UNIT, UROB, ULEU, UEPI, UWBC, URBC, UBAC, CAST, CRYS, UCOM, BILUA,  in the last 72 hours  Basic Metabolic Panel:  Recent Labs Lab 12/10/12 1523 12/13/12 1857  NA 139 138  K 3.3* 3.7  CL 102 102  CO2 23 22  GLUCOSE 106* 114*  BUN 19 18  CREATININE 0.66 0.64  CALCIUM  9.3 9.2   GFR The CrCl is unknown because both a height and weight (above a minimum accepted value) are required for this calculation. Liver Function Tests:  Recent Labs Lab 12/13/12 1857  AST 64*  ALT 47*  ALKPHOS 841*  BILITOT 0.4  PROT 6.3  ALBUMIN 2.7*   No results found for this basename: LIPASE, AMYLASE,  in the last 168 hours No results found for this basename: AMMONIA,  in the last 168 hours Coagulation profile No results found for this basename: INR, PROTIME,  in the last 168 hours  CBC:  Recent Labs Lab 12/10/12 1523 12/13/12 1857  WBC 5.9 8.4  HGB 8.7* 9.4*  HCT 26.6* 29.1*  MCV 80.1 80.8  PLT 343 366   Cardiac Enzymes: No results found for this basename: CKTOTAL, CKMB, CKMBINDEX, TROPONINI,  in the last 168 hours BNP: No components found with this basename: POCBNP,  CBG: No results found for this basename: GLUCAP,  in the last 168 hours D-Dimer No results found for this basename: DDIMER,  in the last 72 hours Hgb A1c No results found for this basename: HGBA1C,  in the last 72 hours Lipid Profile No results found for this basename:  CHOL, HDL, LDLCALC, TRIG, CHOLHDL, LDLDIRECT,  in the last 72 hours Thyroid function studies No results found for this basename: TSH, T4TOTAL, FREET3, T3FREE, THYROIDAB,  in the last 72 hours Anemia work up No results found for this basename: VITAMINB12, FOLATE, FERRITIN, TIBC, IRON, RETICCTPCT,  in the last 72 hours Microbiology No results found for this or any previous visit (from the past 240 hour(s)).    Studies:  No results found.  Assessment: 47 y.o. [place] woman  1. 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  2.Status post adjuvant chemotherapy July to November 2011, consisting of 4 cycles of dose dense doxorubicin and  cyclophosphamide, followed by 4 of 12 planned weekly doses of paclitaxel given adjuvantly, discontinued secondary to peripheral neuropathy.  3. Status post radiation to the right chest wall, truncated due to problems with the patient missing appointments.  4. Tamoxifen started June of 2012, but discontinued due to nausea.  5. metastatic disease to liver and bone pathologically documented October of 2012, estrogen receptor 100% positive, progesterone receptor 7% positive, with no HER-2 amplification; treatment in the metastatic setting has consisted of:  6.Faslodex started 03/16/2011, discontinued November 2013 with progression.  7. Zoladex every 3 months started on 03/16/2011, and monthly zoledronic acid started November 2011, the Zometa afterwards held due to pending dental procedures and possible extractions.  8. Status post thoracic laminectomy, T9-T11, for impending cord compression on 03/21/2011, followed by irradiation to the spine completed November 2011  9. 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 Progression noted by scans in November 2013, at which time Faslodex was discontinued. Patient was started on exemestane in early December, with everolimus started in mid December at 5 mg daily.  11. Nondisplaced right femoral neck fracture, status post right hip arthroplasty on 05/04/2012 under the care of Dr[facility].  12. Hypercalcemia with elevated serum creatinine: resolved  13. s/p palliative radiation therapy to T4-T7 for cord compression completed 08/15/2012  14. Poorly controlled pain, recurrent ascites, severe malnutrition, nausea, vomiting, constipation, Left ptosis  PLAN:  [name] is "back to baseline" and the plan is for her to return to her apartment. I will ask Dr Michell Heinrich to review  [name]'s recent brain MRI and see if she would like to obtain a base-of-skull film today to evaluate the left ptosis. Otherwise the pan will be for [name] to return home tomorrow AM, to her apartment, under Hospice care. In future we will copnsider outpatient IVF (at home) in an effort to avoid ED visits if possible.        Lowella Dell, MD 12/14/2012  8:45 AM

## 2012-12-15 ENCOUNTER — Ambulatory Visit: Payer: Medicaid Other

## 2012-12-15 ENCOUNTER — Ambulatory Visit: Admission: RE | Admit: 2012-12-15 | Payer: Medicaid Other | Source: Ambulatory Visit | Admitting: Radiation Oncology

## 2012-12-15 MED ORDER — MORPHINE BOLUS VIA INFUSION: INTRAVENOUS | Status: DC

## 2012-12-15 NOTE — Discharge Summary (Signed)
Physician Discharge Summary  Patient ID: [name] MRN: [id number] DOB/AGE: [birth year] 47 y.o.  Admit date: 12/13/2012 Discharge date: 12/15/2012  Primary Care Physician:  Pcp Not In System   Discharge Diagnoses:    . Abdominal pain . Ascites . Back pain . Bone metastases . 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 Constipation . Malignant neoplasm of breast (female), unspecified site . Protein-calorie malnutrition, severe  Discharge Medications:    Medication List    STOP taking these medications       ondansetron 4 MG tablet  Commonly known as:  ZOFRAN      TAKE these medications       cyclobenzaprine 10 MG tablet  Commonly known as:  FLEXERIL  Take 1 tablet (10 mg total) by mouth 3 (three) times daily as needed for muscle spasms.     dexamethasone 4 MG tablet  Commonly known as:  DECADRON  Take 2 tablets (8 mg total) by mouth 2 (two) times daily with a meal. [facility]     dextrose 5 % SOLN 250 mL with morphine 25 MG/ML SOLN 250 mg  Inject 5 mg/hr into the vein continuous.     DSS 100 MG Caps  Take 200 mg by mouth 2 (two) times daily.     exemestane 25 MG tablet  Commonly known as:  AROMASIN  Take 1 tablet (25 mg total) by mouth daily after breakfast.     feeding supplement Liqd  Take 237 mLs by mouth 4 (four) times daily.     LORazepam 1 MG tablet  Commonly known as:  ATIVAN  Take 1 tablet (1 mg total) by mouth every 6 (six) hours as needed for anxiety.     morphine 5 mg/mL Soln  Morphine by CI at 5 mg/ hour with boluses of 2-4 mg every 30 minutes as needed for pain     omeprazole 40 MG capsule  Commonly known as:  PRILOSEC  Take 1 capsule (40 mg total) by mouth daily.     ondansetron 4 MG disintegrating tablet  Commonly known as:  ZOFRAN ODT  4mg  ODT q4 hours prn nausea/vomit     polyethylene glycol packet  Commonly known as:  MIRALAX / GLYCOLAX  Take 17 g by mouth daily.     promethazine 25 MG  tablet  Commonly known as:  PHENERGAN  Take 1 tablet (25 mg total) by mouth every 6 (six) hours as needed for nausea. [facility]     zolpidem 5 MG tablet  Commonly known as:  AMBIEN  Take 5 mg by mouth at bedtime as needed for sleep.         Disposition and Follow-up: to see Zollie Scale at [facility]-- call for appointment  Significant Diagnostic Studies:  Mr [name] NW Contrast  12/08/2012   *RADIOLOGY REPORT*  Clinical Data: Metastatic breast cancer.  Visual changes.  MRI HEAD WITHOUT AND WITH CONTRAST  Technique:  Multiplanar, multiecho pulse sequences of the brain and surrounding structures were obtained according to standard protocol without and with intravenous contrast  Contrast: 10mL MULTIHANCE GADOBENATE DIMEGLUMINE 529 MG/ML IV SOLN  Comparison: CT 06/15/2012.  MRI 02/20/2012  Findings: There is suspicion of metastatic disease in the C3 vertebral body, with progression from the prior MRI.  Some of the images are degraded by motion especially the axial T1 weighted sequence prior to contrast and the postcontrast images.  The mass lesion in the central skull base  has improved.  This mass filled the clivus on the prior study.  There currently is abnormal low signal on T1 in the clivus which shows enhancement.  It is difficult to  determine if this is  residual tumor or treated tumor.  Given the enhancement, continued follow-up is suggested. There was extension of tumor into the left cavernous sinus on the prior study which appears to have resolved.  The pituitary is uplifted by tumor in the floor of the sella on the left.  There is no compression of the optic chiasm.  No other bony lesions are identified.  No enhancing lesions are present in the brain.  Ventricle size is normal.  No acute infarct.  Small white matter hyperintensities are unchanged and appear chronic.  No hemorrhage is identified.  IMPRESSION: The mass in the clivus has improved following treatment compared with  the MRI 02/20/2012.  There remains abnormal enhancement within the clivus which could be residual tumor or treated tumor. Improvement in tumor extension into the sella and left cavernous sinus.  No intracranial metastatic deposits.  Probable progression of metastatic disease in the C3 vertebral body.   Original Report Authenticated By: Janeece Riggers, M.D.   Dg Abd Acute W/chest  12/10/2012   *RADIOLOGY REPORT*  Clinical Data: Abdominal pain.  Metastatic breast carcinoma.  ACUTE ABDOMEN SERIES (ABDOMEN 2 VIEW & CHEST 1 VIEW)  Comparison: 11/07/2012  Findings: No evidence of dilated bowel loops.  No evidence of free air.  Diffuse hepatomegaly seen displacing bowel loops inferiorly.  Left arm PICC line is seen in appropriate position.  Both lungs are clear.  Heart size within normal limits.  IMPRESSION:  1.  Unremarkable bowel gas pattern. 2.  Hepatomegaly. 3.  No active lung disease.   Original Report Authenticated By: Myles Rosenthal, M.D.    Discharge Laboratory Values: Basic Metabolic Panel:  Recent Labs Lab 12/10/12 1523 12/13/12 1857  NA 139 138  K 3.3* 3.7  CL 102 102  CO2 23 22  GLUCOSE 106* 114*  BUN 19 18  CREATININE 0.66 0.64  CALCIUM 9.3 9.2   GFR Estimated Creatinine Clearance: 68.8 ml/min (by C-G formula based on Cr of 0.64). Liver Function Tests:  Recent Labs Lab 12/13/12 1857  AST 64*  ALT 47*  ALKPHOS 841*  BILITOT 0.4  PROT 6.3  ALBUMIN 2.7*   No results found for this basename: LIPASE, AMYLASE,  in the last 168 hours No results found for this basename: AMMONIA,  in the last 168 hours Coagulation profile No results found for this basename: INR, PROTIME,  in the last 168 hours  CBC:  Recent Labs Lab 12/10/12 1523 12/13/12 1857  WBC 5.9 8.4  HGB 8.7* 9.4*  HCT 26.6* 29.1*  MCV 80.1 80.8  PLT 343 366   Cardiac Enzymes: No results found for this basename: CKTOTAL, CKMB, CKMBINDEX, TROPONINI,  in the last 168 hours BNP: No components found with this  basename: POCBNP,  CBG: No results found for this basename: GLUCAP,  in the last 168 hours D-Dimer No results found for this basename: DDIMER,  in the last 72 hours Hgb A1c No results found for this basename: HGBA1C,  in the last 72 hours Lipid Profile No results found for this basename: CHOL, HDL, LDLCALC, TRIG, CHOLHDL, LDLDIRECT,  in the last 72 hours Thyroid function studies No results found for this basename: TSH, T4TOTAL, FREET3, T3FREE, THYROIDAB,  in the last 72 hours Anemia work up No results found for this basename: VITAMINB12, FOLATE,  FERRITIN, TIBC, IRON, RETICCTPCT,  in the last 72 hours Microbiology No results found for this or any previous visit (from the past 240 hour(s)).   Brief H and P: For complete details please refer to admission H and P, but in brief, the patient was admitted with uncontrolled pain, nausea and vomiting, in the setting of metastatic breast cancer. See hospital course for details  Physical Exam at Discharge: BP 152/77  Pulse 73  Temp(Src) 98.4 F (36.9 C) (Oral)  Resp 20  Ht 5\' 2"  (1.575 m)  Wt 119 lb 14.4 oz (54.386 kg)  BMI 21.92 kg/m2  SpO2 100% ZOX:[id number] African American woman examined in bed Cardiovascular:RRR Respiratory:clear bilaterally, auscultated anterolaterally Gastrointestinal:abd distended, positive BS, mildly tender to palpation over RUQ Extremities:no edema    Hospital Course:  Active Problems:   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, brain and bone   Bone metastases   Malignant neoplasm of breast (female), unspecified site   Constipation   Back pain   Abdominal pain   Ascites   Protein-calorie malnutrition, severe  47 y.o. [place] woman  1. 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  2.Status post adjuvant chemotherapy  July to November 2011, consisting of 4 cycles of dose dense doxorubicin and cyclophosphamide, followed by 4 of 12 planned weekly doses of paclitaxel given adjuvantly, discontinued secondary to peripheral neuropathy.  3. Status post radiation to the right chest wall, truncated due to problems with the patient missing appointments.  4. Tamoxifen started June of 2012, but discontinued due to nausea.  5. metastatic disease to liver and bone pathologically documented October of 2012, estrogen receptor 100% positive, progesterone receptor 7% positive, with no HER-2 amplification; treatment in the metastatic setting has consisted of:  6.Faslodex started 03/16/2011, discontinued November 2013 with progression.  7. Zoladex every 3 months started on 03/16/2011, and monthly zoledronic acid started November 2011, the Zometa afterwards held due to pending dental procedures and possible extractions.  8. Status post thoracic laminectomy, T9-T11, for impending cord compression on 03/21/2011, followed by irradiation to the spine completed November 2011  9. 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 Progression noted by scans in November 2013, at which time Faslodex was discontinued. Patient was started on exemestane in early December, with everolimus started in mid December at 5 mg daily.  11. Nondisplaced right femoral neck fracture, status post right hip arthroplasty on 05/04/2012 under the care of Dr.[facility]  12. Hypercalcemia with elevated serum creatinine: resolved  13. s/p palliative radiation therapy to T4-T7 for cord compression completed 08/15/2012  14. Poorly controlled pain, recurrent ascites, severe malnutrition, nausea, vomiting, constipation, Left ptosis  [name] was hydrated aggressively, her pain was  treated with intravenous morphine, and she was placed on a bowel prolhylaxis regimen. Clinically she improved sufficiently that she can be discharged home able to ambulate within the house and keep herself hydrated and fed, with assistance (her cousin will be staying there and she has Hospice support). I discussed her left ptosis with Dr Michell Heinrich and we will be repeating a brain MRI in about a month--at this point there is no evidence that more radiation would be helpful. We have placed an  eyepatch over the left eye. Patient received significant support thought the chaplaincy and Hospice teams. She was offered a return to [facility] but "not at this time."  Diet:  regular  Activity:  As tolerated  Condition at Discharge:   improved  Signed: Dr. Ruthann Cancer [phone]  12/15/2012, 7:56 AM

## 2012-12-15 NOTE — Progress Notes (Signed)
CSW received notification from [facility] that pt did not need ambulance transportation to home.   No further social work needs identified at this time.  CSW signing off.   Please re-consult if social work needs arise.   Jacklynn Lewis, MSW, LCSWA  Clinical Social Work [phone]

## 2012-12-15 NOTE — Progress Notes (Addendum)
Room [facility] 1309 - [name] - [facility] RN Visit-R.Tatyanna Cronk RN  Related admission to [facility] diagnosis of metastatic breast cancer.  Pt is DNR code with OOF DNR on shadow chart.  Pt alert & oriented, lying in bed, with no complaints of pain or discomfort today.  No family present.  Pt to be discharged today.  Prescription for MS continuous via CADD pump being verified with Dr. Darnelle Catalan by [facility] rep Baxter Hire.  Pt anxious to be discharged due to transportation issues. Pt needs eye pads - [facility] DME rep aware of need.  12:40- follow up- pt hooked up to CADD pump - [facility] RN Dian Situ verified settings with this RN. Pt discharged via private vehicle @ 12:40pm.    Please call [facility] @ [phone]- ask for RN Liaison or after hours,ask for on-call RN with any hospice needs.   Thank you.  Joneen Boers, RN  [facility]  Hospice Liaison

## 2012-12-23 ENCOUNTER — Encounter: Payer: Self-pay | Admitting: Physician Assistant

## 2012-12-23 ENCOUNTER — Ambulatory Visit: Payer: Medicaid Other | Admitting: Physician Assistant

## 2012-12-23 ENCOUNTER — Other Ambulatory Visit: Payer: Medicaid Other | Admitting: Lab

## 2012-12-23 NOTE — Progress Notes (Signed)
FTKA today.   Desk Nurse, Mena Pauls, RN, spoke with the patient's Hospice nurse and informed her of missed appointment.  Hospice nurse is also trying to get in touch with the patient.  Zollie Scale, PA-C  12/23/2012

## 2012-12-28 ENCOUNTER — Other Ambulatory Visit: Payer: Self-pay | Admitting: *Deleted

## 2012-12-28 ENCOUNTER — Telehealth: Payer: Self-pay | Admitting: Oncology

## 2012-12-28 ENCOUNTER — Telehealth: Payer: Self-pay | Admitting: *Deleted

## 2012-12-28 NOTE — Telephone Encounter (Signed)
Pt called to rs her FTKA. gv appt for 12/29/12 labs@ 11:15am and ov @ 11:45am. Pt is aware...td

## 2012-12-28 NOTE — Progress Notes (Signed)
Patient FTKA and needs to be rescheduled.

## 2012-12-28 NOTE — Telephone Encounter (Signed)
Per staff message and POF I have scheduled appts.  JMW  

## 2012-12-29 ENCOUNTER — Ambulatory Visit

## 2012-12-29 ENCOUNTER — Ambulatory Visit: Admitting: Oncology

## 2012-12-29 ENCOUNTER — Ambulatory Visit: Admitting: Physician Assistant

## 2012-12-29 ENCOUNTER — Other Ambulatory Visit: Admitting: Lab

## 2012-12-30 ENCOUNTER — Telehealth: Payer: Self-pay | Admitting: *Deleted

## 2012-12-30 ENCOUNTER — Telehealth: Payer: Self-pay | Admitting: Oncology

## 2012-12-30 NOTE — Telephone Encounter (Signed)
This RN spoke with hospice per concern by Angelica Chessman RN covering for regular nurse per visit today.  Pt called hospice stating she needed another morphine bag- when RN Angelica Chessman ) went to the home, line was disconnected - per inquiry pt informs Mandy " I don't like to wear it all the time because I don't want to carry it around "  Pt states " I am not having pain " but per medication history pt is maxing out on boluses with up to " 90 attempts ".  Note pt does NOT cut pump off - therefore line is continuing to run " pretty much right onto the floor ".  Pt does clamp PICC line. New bag was attached with RN discussing with pt concerns related to disconnecting and infection and safety concerns.  Hospice wanted MD made aware and inquire about use of other methods for pain control with fentanyl primary consideration.  Above discussed with RN including pt's known history of inconsistant use of pain medications - with pt using more then prescribed and then having severe pain that is comes in to the office crying.  Plan at present is to have MD review on Monday above concerns.  Note this RN also received a message from pt at approximately noon with pt tearfully requesting a return call.  This RN returned call and obtained VM for pt. Message left to call again or call hospice RN.

## 2012-12-30 NOTE — Telephone Encounter (Signed)
Sent letter to patient from Dr. Magrinat. °

## 2013-01-02 ENCOUNTER — Telehealth: Payer: Self-pay | Admitting: *Deleted

## 2013-01-02 NOTE — Telephone Encounter (Signed)
This RN spoke with Elijah Birk RN, with [facility] per concerns noted last week.  Per MD review appropriate to proceed with change to fentanyl patch.  Pam will follow up with pt's data ( note she was off last week ) to verify current dose of MSO4 q hour for change over.

## 2013-01-03 ENCOUNTER — Encounter: Payer: Self-pay | Admitting: *Deleted

## 2013-01-03 ENCOUNTER — Telehealth: Payer: Self-pay | Admitting: *Deleted

## 2013-01-03 NOTE — Progress Notes (Signed)
Clinical Social Work received call from patient requesting help obtaining "disability green card" from [employer].  CSW left [employer] letter at [facility] front desk, patient plans to pick up letter this afternoon.  Kathrin Penner, MSW, LCSW Clinical Social Worker [facility] [phone]

## 2013-01-03 NOTE — Telephone Encounter (Signed)
Pt called for appt. gv appt d/t for 01/05/13 @ 1:45pm...td

## 2013-01-05 ENCOUNTER — Encounter: Admitting: Physician Assistant

## 2013-01-05 ENCOUNTER — Ambulatory Visit (HOSPITAL_BASED_OUTPATIENT_CLINIC_OR_DEPARTMENT_OTHER): Admitting: Oncology

## 2013-01-05 ENCOUNTER — Other Ambulatory Visit: Payer: Self-pay | Admitting: *Deleted

## 2013-01-05 VITALS — BP 157/95 | HR 96 | Temp 98.7°F | Resp 20 | Ht 62.0 in | Wt 131.2 lb

## 2013-01-05 DIAGNOSIS — C50919 Malignant neoplasm of unspecified site of unspecified female breast: Secondary | ICD-10-CM

## 2013-01-05 DIAGNOSIS — C787 Secondary malignant neoplasm of liver and intrahepatic bile duct: Secondary | ICD-10-CM

## 2013-01-05 MED ORDER — FENTANYL 50 MCG/HR TD PT72: 1.0000 | MEDICATED_PATCH | TRANSDERMAL | Status: DC

## 2013-01-05 MED ORDER — MORPHINE SULFATE (CONCENTRATE) 20 MG/ML PO SOLN: 10.0000 mg | ORAL | Status: DC | PRN

## 2013-01-05 MED ORDER — TRIAMTERENE-HCTZ 37.5-25 MG PO CAPS: 1.0000 | ORAL_CAPSULE | ORAL | Status: DC

## 2013-01-05 MED ORDER — MORPHINE SULFATE 15 MG PO TABS: 15.0000 mg | ORAL_TABLET | ORAL | Status: DC | PRN

## 2013-01-05 NOTE — Progress Notes (Signed)
This patient was originally on my schedule, but was seen by Dr. Darnelle Catalan today.  Zollie Scale, PA-C 01/05/2013

## 2013-01-05 NOTE — Progress Notes (Signed)
ID: [name]   DOB: 08/13/1965  MR#: [id number]  CSN#:[id number]  PCP: Pcp Not In System   HISTORY OF PRESENT ILLNESS: The patient delivered a child on 05/17/2009, after which she felt fullness in the right breast. She eventually brought this to her doctors attention and was scheduled for mammography and ultrasound on 03/08/2009. There were no prior exams for comparison. Mammography showed the breast to be dense, with a 5 cm obscured mass in the outer midportion of the right breast. Mass was easily palpable, and there was also a mass in the outer right periareolar region. They were suspicious microcalcifications in the outer midportion of the right breast, as well as within the mass itself. Ultrasound showed an extensive heterogeneous area, corresponding to the palpable abnormality with multiple solid and cystic foci. Overall, the mass measured in excess of 5 cm, in addition to cysts in the breast.  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 The tumors were ER +100%, PR +80%.  Bilateral breast MRI was obtained 03/22/2009. There was a large area of abnormal enhancement in the lower outer quadrant of the right breast measuring up to 9.7 cm. No abnormal appearing lymph nodes and no other masses in either breast.  Patient underwent definitive right mastectomy and sentinel lymph node sampling on 04/29/2009 under the care of Dr. Derrell Lolling. 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 Repeat prognostic panel showed tumor to be ER +93% and PR +51%. HER-2/neu was negative with a ratio of 0.71, with proliferation marker of 36%.  Patient was evaluated by Dr. Darnelle Catalan in January 2011 but failed to keep appointments until she was seen here again in July 2011. In July, she began adjuvant chemotherapy. She received 4  cycles of dose dense doxorubicin and cyclophosphamide followed by 4 weekly doses of paclitaxel which was discontinued in November 2011 secondary to peripheral neuropathy. The patient then received radiation therapy to the right chest wall which was truncated due to problems with missing appointments.  Patient was started on tamoxifen briefly in June of 2012 which was discontinued due to nausea. The nausea continued, and an abdominal CT in October 2012 confirmed a growth in liver lesions. These were biopsied on 03/10/2011 and showed metastatic adenocarcinoma, still ER positive at 100%, PR +5%. Her subsequent treatments are as detailed below  INTERVAL HISTORY: [name] returns today for followup of her metastatic breast carcinoma. She is currently followed closely by hospice. She was begun place for a time but moved to a new apartment that she is very pleased with colon and S2 bedrooms, its all on one level, and a neighbors are quiet.  REVIEW OF SYSTEMS: [name] pain is well-controlled on her current regimen of 5 mg per our by PICC, with boluses every 15 minutes as needed. She tells me she almost never uses a bolus. Her pain control on this is excellent, currently 0-1. She is constipated. She does not have a bowel movement every day. The bowel movements are heart when they start but then will day, along". When asked what she is doing about this she is a bit vague. When asked what she would do if she had no bowel movement for 3 days, she wasn't sure. This was reviewed with her today. She has minimal nausea, no vomiting, no taste alteration, no chest pain or pressure, no cough, phlegm production, or  pleurisy. She has not had a paracentesis for some time. Her abdomen is prominent but not uncomfortable. She is getting ankle swelling, which bothers her. She has had no headaches. Her left eye is covered by a patch but she says it "coming along". A detailed review of systems was otherwise noncontributory  PAST  MEDICAL HISTORY: Past Medical History  Diagnosis Date  . Hypertension   . GERD (gastroesophageal reflux disease)   . 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 Pyelonephritis 10/01/11  . Recurrent UTI (urinary tract infection) 10/01/11  . S/P chemotherapy, time since 4-12 weeks   . S/P radiation > 12 weeks 03/15/12- 04/07/12    right hip, clivus  . 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 Brain cancer     spread from breast ca  . Hypercalcemia 06/21/2012  . History of radiation therapy 08/06/10 -09/16/10    R supraclav fossa, R chest wall, R drain site  . radiaiton therapy 07/22/12 -08/15/12    T4-T7    PAST SURGICAL HISTORY: Past Surgical History  Procedure Laterality Date  . Cesarean section  1989; 2010  . Mastectomy  2011    right  . Back surgery  2012    "removed tumor,cancer, from my spine  . Port-a-cath removal  2012    right chest  . Portacath placement  2011    right  . Tubal ligation  2010  . Breast biopsy  2011    right  . Hip arthroplasty  05/04/2012    Procedure: ARTHROPLASTY BIPOLAR HIP;  Surgeon: Andrea Pal, MD;  Location: [facility];  Service: Orthopedics;  Laterality: Right;  RIGHT HIP HEMI ARTHROPLASTY     FAMILY HISTORY The patient's father is alive at age 87.  The patient's mother is alive at age 82.  She has four sisters.  She had one brother who died from colon cancer at the age of 27.  There is no breast or ovarian cancer history in the family to her knowledge.  GYNECOLOGIC HISTORY: She is GX P2.  First pregnancy to term at age 89.    SOCIAL HISTORY: The patient is not employed.  She lives by herself (her small son whose name is [name] lives with his dad in [place]).  Her first child, [name], lives in this area. She is not in close touch with the patient, but ""we do talk". The patient has one grandchild.  She attends the [employer].    ADVANCED DIRECTIVES: At our facility DO NOT RESUSCITATE is in  place  HEALTH MAINTENANCE: History  Substance Use Topics  . Smoking status: Former Smoker -- 0.25 packs/day for 31 years    Types: Cigarettes    Quit date: 04/26/2012  . Smokeless tobacco: Never Used  . Alcohol Use: No     Colonoscopy:  PAP:  Bone density:  Lipid panel:  No Known Allergies  Current Outpatient Prescriptions  Medication Sig Dispense Refill  . cyclobenzaprine (FLEXERIL) 10 MG tablet Take 1 tablet (10 mg total) by mouth 3 (three) times daily as needed for muscle spasms.  30 tablet  0  . dexamethasone (DECADRON) 4 MG tablet Take 2 tablets (8 mg total) by mouth 2 (two) times daily with a meal. [facility]  120 tablet  1  . dextrose 5 % SOLN 250 mL with morphine 25 MG/ML SOLN 250 mg Inject 5 mg/hr into the vein continuous.  100 mg  0  . docusate sodium 100 MG CAPS Take 200 mg by mouth 2 (two) times daily.  10 capsule  0  . exemestane (AROMASIN) 25 MG tablet Take 1 tablet (25 mg total) by mouth daily after breakfast.  30 tablet  3  . feeding supplement (ENSURE COMPLETE) LIQD Take 237 mLs by mouth 4 (four) times daily.  30 Bottle  0  . fentaNYL (DURAGESIC - DOSED MCG/HR) 50 MCG/HR Place 1 patch (50 mcg total) onto the skin every 3 (three) days.  5 patch  0  . LORazepam (ATIVAN) 1 MG tablet Take 1 tablet (1 mg total) by mouth every 6 (six) hours as needed for anxiety.  30 tablet  0  . morphine (ROXANOL) 20 MG/ML concentrated solution Take 0.5 mLs (10 mg total) by mouth every 2 (two) hours as needed for pain.  30 mL  0  . omeprazole (PRILOSEC) 40 MG capsule Take 1 capsule (40 mg total) by mouth daily.  30 capsule  3  . ondansetron (ZOFRAN ODT) 4 MG disintegrating tablet 4mg  ODT q4 hours prn nausea/vomit  12 tablet  0  . polyethylene glycol (MIRALAX / GLYCOLAX) packet Take 17 g by mouth daily.  14 each  0  . promethazine (PHENERGAN) 25 MG tablet Take 1 tablet (25 mg total) by mouth every 6 (six) hours as needed for nausea. [facility]  30 tablet  1  .  triamterene-hydrochlorothiazide (DYAZIDE) 37.5-25 MG per capsule Take 1 each (1 capsule total) by mouth every morning.  30 capsule  12  . zolpidem (AMBIEN) 5 MG tablet Take 5 mg by mouth at bedtime as needed for sleep.      . [DISCONTINUED] Alum & Mag Hydroxide-Simeth (MAGIC MOUTHWASH W/LIDOCAINE) SOLN Take 5 mLs by mouth 3 (three) times daily as needed (take 15 minutes prior to meals.).  480 mL  0   No current facility-administered medications for this visit.    OBJECTIVE: Middle-aged [place] American woman who walks with a cane   Filed Vitals:   01/05/13 1500  BP: 157/95  Pulse: 96  Temp: 98.7 F (37.1 C)  Resp: 20     Body mass index is 23.99 kg/(m^2).    ECOG: 2 Filed Weights   01/05/13 1500  Weight: 131 lb 3.2 oz (59.512 kg)   Sclerae mildly icteric Oropharynx clear No cervical or supraclavicular adenopathy Lungs no crackles or wheezes, fair excursion bilaterally Heart regular rate and rhythm Abdomen is distended, non tender 1+ ankle edema bilaterally. No upper extremity edema  PICC intact L upper arm Neuro: nonfocal, well oriented, pleasant affect Breasts: right breast status post mastectomy; no evidence of local recurrence. The right axilla is benign. Left breast unremarkable   LAB RESULTS: Lab Results  Component Value Date   WBC 8.4 12/13/2012   NEUTROABS 7.2* 10/31/2012   HGB 9.4* 12/13/2012   HCT 29.1* 12/13/2012   MCV 80.8 12/13/2012   PLT 366 12/13/2012      Chemistry      Component Value Date/Time   NA 138 12/13/2012 1857   NA 137 10/31/2012 1427   K 3.7 12/13/2012 1857   K 3.9 10/31/2012 1427   CL 102 12/13/2012 1857   CL 98 10/31/2012 1427   CO2 22 12/13/2012 1857   CO2 29 10/31/2012 1427   BUN 18 12/13/2012 1857   BUN 14.7 10/31/2012 1427   CREATININE 0.64 12/13/2012 1857   CREATININE 0.7 10/31/2012 1427      Component Value Date/Time  CALCIUM 9.2 12/13/2012 1857   CALCIUM 9.4 10/31/2012 1427   ALKPHOS 841* 12/13/2012 1857   ALKPHOS 1,005* 10/31/2012 1427   AST 64*  12/13/2012 1857   AST 128* 10/31/2012 1427   ALT 47* 12/13/2012 1857   ALT 57* 10/31/2012 1427   BILITOT 0.4 12/13/2012 1857   BILITOT 1.03 10/31/2012 1427       Lab Results  Component Value Date   LABCA2 89* 06/09/2012    STUDIES: Mr [name] YN Contrast  12/12/12   *RADIOLOGY REPORT*  Clinical Data: Metastatic breast cancer.  Visual changes.  MRI HEAD WITHOUT AND WITH CONTRAST  Technique:  Multiplanar, multiecho pulse sequences of the brain and surrounding structures were obtained according to standard protocol without and with intravenous contrast  Contrast: 10mL MULTIHANCE GADOBENATE DIMEGLUMINE 529 MG/ML IV SOLN  Comparison: CT 06/15/2012.  MRI 02/20/2012  Findings: There is suspicion of metastatic disease in the C3 vertebral body, with progression from the prior MRI.  Some of the images are degraded by motion especially the axial T1 weighted sequence prior to contrast and the postcontrast images.  The mass lesion in the central skull base has improved.  This mass filled the clivus on the prior study.  There currently is abnormal low signal on T1 in the clivus which shows enhancement.  It is difficult to  determine if this is  residual tumor or treated tumor.  Given the enhancement, continued follow-up is suggested. There was extension of tumor into the left cavernous sinus on the prior study which appears to have resolved.  The pituitary is uplifted by tumor in the floor of the sella on the left.  There is no compression of the optic chiasm.  No other bony lesions are identified.  No enhancing lesions are present in the brain.  Ventricle size is normal.  No acute infarct.  Small white matter hyperintensities are unchanged and appear chronic.  No hemorrhage is identified.  IMPRESSION: The mass in the clivus has improved following treatment compared with the MRI 02/20/2012.  There remains abnormal enhancement within the clivus which could be residual tumor or treated tumor. Improvement in tumor extension  into the sella and left cavernous sinus.  No intracranial metastatic deposits.  Probable progression of metastatic disease in the C3 vertebral body.   Original Report Authenticated By: Janeece Riggers, M.D.   Dg Abd Acute W/chest  12/10/2012   *RADIOLOGY REPORT*  Clinical Data: Abdominal pain.  Metastatic breast carcinoma.  ACUTE ABDOMEN SERIES (ABDOMEN 2 VIEW & CHEST 1 VIEW)  Comparison: 11/07/2012  Findings: No evidence of dilated bowel loops.  No evidence of free air.  Diffuse hepatomegaly seen displacing bowel loops inferiorly.  Left arm PICC line is seen in appropriate position.  Both lungs are clear.  Heart size within normal limits.  IMPRESSION:  1.  Unremarkable bowel gas pattern. 2.  Hepatomegaly. 3.  No active lung disease.   Original Report Authenticated By: Myles Rosenthal, M.D.   ASSESSMENT:   47 y.o.  [place] woman    1. 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    2.Status post adjuvant chemotherapy December 13, 2022 to November 2011, consisting of 4 cycles of dose dense doxorubicin and cyclophosphamide, followed by 4 of 12 planned weekly doses of paclitaxel given adjuvantly, discontinued secondary to peripheral neuropathy.    3. Status post radiation  to the right chest wall, truncated due to problems with the patient missing appointments.   4. Tamoxifen started June of 2012, but discontinued due to nausea.    5. metastatic disease to liver and bone pathologically documented October of 2012, estrogen receptor 100% positive, progesterone receptor 7% positive, with no HER-2 amplification;  treatment in the metastatic setting has consisted of:   6.Faslodex started 03/16/2011, discontinued November 2013 with progression. Zoladex every 3 months also started on 03/16/2011, and monthly zoledronic acid started November  2011, the Zometa I currently held due to pending dental procedures and possible extractions.             7. Status post thoracic laminectomy, T9-T11, for impending cord compression on 03/21/2011, followed by irradiation to the spine completed November 2011  8. 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 s/p palliative radiation to the Right hip clivus to 30 Gy completed 04/07/2012  10.  Progression noted by scans in November 2013, at which time Faslodex was discontinued. Patient was started on exemestane in early December, with everolimus started in mid December at 5 mg daily.  Continues to receive Zoladex injections every 3 months, last given 09/05/2012.  11. Nondisplaced right femoral neck fracture, status post right hip arthroplasty on 05/04/2012 under [facility].  12. Hypercalcemia with elevated serum creatinine: resolved  13.  s/p palliative radiation therapy for cord compression completed 08/15/2012  14. Poorly controlled pain  15. Under hospice care, out of facility DO NOT RESUSCITATE in place      PLAN:  Hinata tells me her pain is well controlled on the current pump, but she hates having to carry it everywhere and she finds the left upper extremity PICC uncomfortable. "It looks like it's wanting to come out". There have been some irregularities regarding the morphine as described to [place] by the hospice nurses. For all these reasons we are going to switch her to fentanyl, and we are going to start with 50 mcg per hour patches, to be changed every 3 days. She may need something for breakthrough pain, so I also wrote her for Roxanol 10 mg per 0.5 mL, to use every 30 minutes to 2 hours as needed.  We discussed bowel prophylaxis and she understands she needs to take 2 stool softeners twice daily, MiraLAX daily, and use a suppository or enema if she has not had a  bowel movement the third day.  Once the patient is stable on a fentanyl patch with occasional Roxanol, we can remove the PICC from the left upper extremity.  I have made her a return appointment here in 2 weeks, but if everything is fine she can cancel and I have already scheduled her a further appointment with me in October of this year. She knows to call for any problems that may develop before her next visit.  Amara Justen C    01/05/2013

## 2013-01-06 ENCOUNTER — Telehealth: Payer: Self-pay | Admitting: *Deleted

## 2013-01-06 NOTE — Telephone Encounter (Signed)
sw pt gv appt for 01/20/13 to have labs @ 12:15pm and ov@ 1:15pm, i also gv appt for 03/22/13 labs@3 :30pm and ov@ 4pm..pt request that i mail a cal as well....td

## 2013-01-09 ENCOUNTER — Telehealth: Payer: Self-pay | Admitting: *Deleted

## 2013-01-09 NOTE — Telephone Encounter (Signed)
This RN received call from [facility] RN with [facility] stating pt called them in tears stating she was in [place] and is now on her way home - but her pain is not controlled on the patches and she wants to " go back on the box " ( meaning IV morphine ). Per Pam pt states the father of her son and the son are coming back with her.  Pam also stated she had attempted to reach pt earlier today but was unsuccessful- per Pam's phone discussion with [name]- Kristalyn states " my phone has been cut off ".  Pt is requesting a nurse to come out tonight and restart the IV morphine -   Per discussion including review with AB/PA recommendation is to increase fentanyl to - if pt does not want to do that she would need to proceed to the ER for IV pain medication.

## 2013-01-10 NOTE — Progress Notes (Signed)
Histology and Location of Primary Cancer: metastatic brerat cancer to spine with cord compression Sites of Visceral and Bony Metastatic Disease:Location(s) of Symptomatic Metastases:   Past/Anticipated chemotherapy by medical oncology, if any: pt called medical oncology 12/30/12 requesting to go back on I V  Morphine pump, per Mena Pauls note recommendation to increase fentanyl to 100 mcg,if patient not wanting to do this would need to proceed to th ER  Pain on a scale of 0-10 is: 2 on lower right abdomen side,  Has 100 mcg fentanyl patch on left arm,   If Spine Met(s), symptoms, if any, include:  Bowel/Bladder retention or incontinence (please describe): constipation   Numbness or weakness in extremities (please describe):   Current Decadron regimen, if applicable:4mg  take 2 tabs(8mg  total) 2x day with a meal/[facility] pt  Ambulatory status? Walker? Wheelchair?: walks with a cane SAFETY ISSUES:  Prior radiation? Yes last  Palliative for cord compresion, T4-T7/27 Gy 58fxs, missed some txs   Pacemaker/ICD?No  Possible current pregnancy? No  Is the patient on methotrexate? No  Current Complaints / other details: wearing patch left eye, phone back on [phone]

## 2013-01-11 ENCOUNTER — Ambulatory Visit
Admission: RE | Admit: 2013-01-11 | Discharge: 2013-01-11 | Disposition: A | Discharge status: Home / Self Care | Source: Ambulatory Visit | Attending: Radiation Oncology | Admitting: Radiation Oncology

## 2013-01-11 ENCOUNTER — Encounter: Payer: Self-pay | Admitting: Radiation Oncology

## 2013-01-11 VITALS — BP 146/90 | HR 106 | Temp 98.9°F | Resp 20 | Wt 129.2 lb

## 2013-01-11 DIAGNOSIS — H532 Diplopia: Secondary | ICD-10-CM | POA: Insufficient documentation

## 2013-01-11 DIAGNOSIS — C719 Malignant neoplasm of brain, unspecified: Secondary | ICD-10-CM

## 2013-01-11 DIAGNOSIS — R29818 Other symptoms and signs involving the nervous system: Secondary | ICD-10-CM | POA: Insufficient documentation

## 2013-01-11 DIAGNOSIS — C50919 Malignant neoplasm of unspecified site of unspecified female breast: Secondary | ICD-10-CM | POA: Insufficient documentation

## 2013-01-11 DIAGNOSIS — Z923 Personal history of irradiation: Secondary | ICD-10-CM | POA: Insufficient documentation

## 2013-01-11 DIAGNOSIS — IMO0002 Reserved for concepts with insufficient information to code with codable children: Secondary | ICD-10-CM | POA: Insufficient documentation

## 2013-01-11 DIAGNOSIS — Z79899 Other long term (current) drug therapy: Secondary | ICD-10-CM | POA: Insufficient documentation

## 2013-01-11 DIAGNOSIS — H0520 Unspecified exophthalmos: Secondary | ICD-10-CM | POA: Insufficient documentation

## 2013-01-11 DIAGNOSIS — C801 Malignant (primary) neoplasm, unspecified: Secondary | ICD-10-CM | POA: Insufficient documentation

## 2013-01-11 NOTE — Progress Notes (Signed)
Please see the Nurse Progress Note in the MD Initial Consult Encounter for this patient. 

## 2013-01-12 ENCOUNTER — Other Ambulatory Visit: Payer: Self-pay | Admitting: *Deleted

## 2013-01-12 ENCOUNTER — Telehealth: Payer: Self-pay | Admitting: Radiation Oncology

## 2013-01-12 ENCOUNTER — Telehealth: Payer: Self-pay | Admitting: *Deleted

## 2013-01-12 DIAGNOSIS — C50919 Malignant neoplasm of unspecified site of unspecified female breast: Secondary | ICD-10-CM

## 2013-01-12 MED ORDER — FENTANYL 50 MCG/HR TD PT72: 2.0000 | MEDICATED_PATCH | TRANSDERMAL | Status: DC

## 2013-01-12 MED ORDER — FENTANYL 50 MCG/HR TD PT72: 1.0000 | MEDICATED_PATCH | TRANSDERMAL | Status: DC

## 2013-01-12 NOTE — Telephone Encounter (Signed)
Per SW, faxed 01/12/13 FUP to Hospice at [facility].  Received confirmation.

## 2013-01-12 NOTE — Progress Notes (Signed)
Department of Radiation Oncology  Phone:  [phone] Fax:        [phone]   Name: [name] MRN: [id number]  DOB: 10-Sep-1965  Date: 01/12/2013  Follow Up Visit Note  Diagnosis: Metastatic breast cancer  Summary of radiation treatments: Right chest wall, SCLV and drain sites (32.4 Gy)  discontinued early due to noncompliance on 09/16/10 Thoracic spine (30 Gy) completed 05/01/11 Right hip (30 Gy) completed 04/07/12 Base of skull/clivus (30 Gy) completed 04/07/12 L1-Sacrum (8 Gy) completed 10/28/12 T4-T7 (30 Gy) completed 08/05/12  Interval History: [name] presents today for routine followup.  She is followed by hospice. She has been on and off the PCA pump and has recently increased her Duragesic patch 200 mcg. She has complained of increasing diplopia and inability to move her left thigh for the past 2 weeks. She states she is having a difficult time reading or watching television at this point. She is wearing a patch. She denies any headaches. An MRI a month ago showed a possible progression in her cervical vertebrae. She is unaccompanied today. She has no difficulties with ambulation. She is remarkably mentally intact.  Allergies: No Known Allergies  Medications:  Current Outpatient Prescriptions  Medication Sig Dispense Refill  . cyclobenzaprine (FLEXERIL) 10 MG tablet Take 1 tablet (10 mg total) by mouth 3 (three) times daily as needed for muscle spasms.  30 tablet  0  . dexamethasone (DECADRON) 4 MG tablet Take 2 tablets (8 mg total) by mouth 2 (two) times daily with a meal. [name]  120 tablet  1  . docusate sodium 100 MG CAPS Take 200 mg by mouth 2 (two) times daily.  10 capsule  0  . exemestane (AROMASIN) 25 MG tablet Take 1 tablet (25 mg total) by mouth daily after breakfast.  30 tablet  3  . feeding supplement (ENSURE COMPLETE) LIQD Take 237 mLs by mouth 4 (four) times daily.  30 Bottle  0  . fentaNYL (DURAGESIC - DOSED MCG/HR) 50 MCG/HR Place 1  patch (50 mcg total) onto the skin every 3 (three) days.  5 patch  0  . LORazepam (ATIVAN) 1 MG tablet Take 1 tablet (1 mg total) by mouth every 6 (six) hours as needed for anxiety.  30 tablet  0  . morphine (MSIR) 15 MG tablet Take 1 tablet (15 mg total) by mouth every 2 (two) hours as needed for pain.  60 tablet  0  . omeprazole (PRILOSEC) 40 MG capsule Take 1 capsule (40 mg total) by mouth daily.  30 capsule  3  . ondansetron (ZOFRAN ODT) 4 MG disintegrating tablet 4mg  ODT q4 hours prn nausea/vomit  12 tablet  0  . zolpidem (AMBIEN) 5 MG tablet Take 5 mg by mouth at bedtime as needed for sleep.      [name] dextrose 5 % SOLN 250 mL with morphine 25 MG/ML SOLN 250 mg Inject 5 mg/hr into the vein continuous.  100 mg  0  . polyethylene glycol (MIRALAX / GLYCOLAX) packet Take 17 g by mouth daily.  14 each  0  . promethazine (PHENERGAN) 25 MG tablet Take 1 tablet (25 mg total) by mouth every 6 (six) hours as needed for nausea. HOSPICE OF  PT  30 tablet  1  . triamterene-hydrochlorothiazide (DYAZIDE) 37.5-25 MG per capsule Take 1 each (1 capsule total) by mouth every morning.  30 capsule  12  . [DISCONTINUED] Alum & Mag Hydroxide-Simeth (MAGIC MOUTHWASH W/LIDOCAINE) SOLN Take 5 mLs by  mouth 3 (three) times daily as needed (take 15 minutes prior to meals.).  480 mL  0   No current facility-administered medications for this encounter.    Physical Exam:  Filed Vitals:   01/11/13 1415  BP: 146/90  Pulse: 106  Temp: 98.9 F (37.2 C)  Resp: 20   she is pleasant female in no distress sitting comfortably examining room chair. She is alert and oriented x3. She has decreased abduction of her left eye. Her left eye has some proptosis as well as abduction.  She is not apple to fully adduct her left eye to midline. Her other cranial nerves are intact.  IMPRESSION: [name] is a 47 y.o. 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 I schedule that for next Friday. She is on Decadron which is appropriate. If she indeed has progression in her clivus we could perhaps give her another 20-24 gray in this area to help alleviate some of her visual symptoms.    [name], MD

## 2013-01-12 NOTE — Telephone Encounter (Signed)
Message left by Andrea Birk RN [facility] stating pt is now wearing 2- fentanyl patches with good benefit. [name] will need refill per last 2 patches were applied today. Pt has good supply of  morphine tablets for breakthrough pain.  No other needs at this time.

## 2013-01-18 ENCOUNTER — Other Ambulatory Visit: Payer: Self-pay | Admitting: Emergency Medicine

## 2013-01-18 MED ORDER — MORPHINE SULFATE 15 MG PO TABS: 15.0000 mg | ORAL_TABLET | ORAL | Status: DC | PRN

## 2013-01-20 ENCOUNTER — Other Ambulatory Visit: Payer: Self-pay | Admitting: Radiation Oncology

## 2013-01-20 ENCOUNTER — Ambulatory Visit (HOSPITAL_COMMUNITY)
Admission: RE | Admit: 2013-01-20 | Discharge: 2013-01-20 | Disposition: A | Discharge status: Home / Self Care | Payer: Medicaid Other | Source: Ambulatory Visit | Attending: Radiation Oncology | Admitting: Radiation Oncology

## 2013-01-20 ENCOUNTER — Other Ambulatory Visit: Admitting: Lab

## 2013-01-20 ENCOUNTER — Ambulatory Visit: Admitting: Physician Assistant

## 2013-01-20 DIAGNOSIS — H532 Diplopia: Secondary | ICD-10-CM | POA: Insufficient documentation

## 2013-01-20 DIAGNOSIS — C7951 Secondary malignant neoplasm of bone: Secondary | ICD-10-CM | POA: Insufficient documentation

## 2013-01-20 DIAGNOSIS — C50919 Malignant neoplasm of unspecified site of unspecified female breast: Secondary | ICD-10-CM | POA: Insufficient documentation

## 2013-01-20 MED ORDER — GADOBENATE DIMEGLUMINE 529 MG/ML IV SOLN
11.0000 mL | Freq: Once | INTRAVENOUS | Status: AC | PRN
  Administered 2013-01-20: 11 mL via INTRAVENOUS

## 2013-01-22 ENCOUNTER — Encounter (HOSPITAL_COMMUNITY): Payer: Self-pay | Admitting: Emergency Medicine

## 2013-01-22 ENCOUNTER — Emergency Department (HOSPITAL_COMMUNITY)
Admission: EM | Admit: 2013-01-22 | Discharge: 2013-01-22 | Disposition: A | Discharge status: Home / Self Care | Payer: Medicaid Other | Attending: Emergency Medicine | Admitting: Emergency Medicine

## 2013-01-22 DIAGNOSIS — Z79899 Other long term (current) drug therapy: Secondary | ICD-10-CM | POA: Insufficient documentation

## 2013-01-22 DIAGNOSIS — I10 Essential (primary) hypertension: Secondary | ICD-10-CM | POA: Insufficient documentation

## 2013-01-22 DIAGNOSIS — Z853 Personal history of malignant neoplasm of breast: Secondary | ICD-10-CM | POA: Insufficient documentation

## 2013-01-22 DIAGNOSIS — Z452 Encounter for adjustment and management of vascular access device: Secondary | ICD-10-CM

## 2013-01-22 DIAGNOSIS — Z85841 Personal history of malignant neoplasm of brain: Secondary | ICD-10-CM | POA: Insufficient documentation

## 2013-01-22 DIAGNOSIS — Z923 Personal history of irradiation: Secondary | ICD-10-CM | POA: Insufficient documentation

## 2013-01-22 DIAGNOSIS — K219 Gastro-esophageal reflux disease without esophagitis: Secondary | ICD-10-CM | POA: Insufficient documentation

## 2013-01-22 DIAGNOSIS — Z8742 Personal history of other diseases of the female genital tract: Secondary | ICD-10-CM | POA: Insufficient documentation

## 2013-01-22 DIAGNOSIS — Z862 Personal history of diseases of the blood and blood-forming organs and certain disorders involving the immune mechanism: Secondary | ICD-10-CM | POA: Insufficient documentation

## 2013-01-22 DIAGNOSIS — Z9221 Personal history of antineoplastic chemotherapy: Secondary | ICD-10-CM | POA: Insufficient documentation

## 2013-01-22 DIAGNOSIS — Z87891 Personal history of nicotine dependence: Secondary | ICD-10-CM | POA: Insufficient documentation

## 2013-01-22 DIAGNOSIS — Y832 Surgical operation with anastomosis, bypass or graft as the cause of abnormal reaction of the patient, or of later complication, without mention of misadventure at the time of the procedure: Secondary | ICD-10-CM | POA: Insufficient documentation

## 2013-01-22 DIAGNOSIS — Z9889 Other specified postprocedural states: Secondary | ICD-10-CM | POA: Insufficient documentation

## 2013-01-22 DIAGNOSIS — Z85830 Personal history of malignant neoplasm of bone: Secondary | ICD-10-CM | POA: Insufficient documentation

## 2013-01-22 DIAGNOSIS — IMO0002 Reserved for concepts with insufficient information to code with codable children: Secondary | ICD-10-CM | POA: Insufficient documentation

## 2013-01-22 DIAGNOSIS — Z87448 Personal history of other diseases of urinary system: Secondary | ICD-10-CM | POA: Insufficient documentation

## 2013-01-22 DIAGNOSIS — T82598A Other mechanical complication of other cardiac and vascular devices and implants, initial encounter: Secondary | ICD-10-CM | POA: Insufficient documentation

## 2013-01-22 DIAGNOSIS — Z8639 Personal history of other endocrine, nutritional and metabolic disease: Secondary | ICD-10-CM | POA: Insufficient documentation

## 2013-01-22 NOTE — ED Notes (Signed)
Patient called x1-no answer 

## 2013-01-22 NOTE — ED Provider Notes (Signed)
CSN: [id number]     Arrival date & time 01/22/13  1743 History     First MD Initiated Contact with Patient 01/22/13 2013     Chief Complaint  Patient presents with  . Vascular Access Problem   (Consider location/radiation/quality/duration/timing/severity/associated sxs/prior Treatment) The history is provided by the patient and medical records. No language interpreter was used.    [name] is a 47 y.o. female  with a hx of metastatic breast cancer, hypertension, GERD presents to the Emergency Department complaining of acute, persistent dislodgment of her PICC line. Patient states she awoke this morning to find approximately 6 inches of the PICC line dislodged from her left upper arm. She states the stitch is out of place as well. She denies fever, chills, redness at the site, drainage from the site. Patient states she has used the PICC line for morphine drip in the past but was taken off of this recently. She states she now uses final patches for pain control. She is no longer receiving chemotherapy.  Patient wishes to have the line removed.  Past Medical History  Diagnosis Date  . Hypertension   . GERD (gastroesophageal reflux disease)   . 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 Pyelonephritis 10/01/11  . Recurrent UTI (urinary tract infection) 10/01/11  . S/P chemotherapy, time since 4-12 weeks   . S/P radiation > 12 weeks 03/15/12- 04/07/12    right hip, clivus  . 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 Brain cancer     spread from breast ca  . Hypercalcemia 06/21/2012  . History of radiation therapy 08/06/10 -09/16/10    R supraclav fossa, R chest wall, R drain site  . radiaiton therapy 07/22/12 -08/15/12    T4-T7   Past Surgical History  Procedure Laterality Date  . Cesarean section  1989; 2010  . Mastectomy  2011    right  . Back surgery  2012    "removed tumor,cancer, from my spine  . Port-a-cath removal  2012    right chest  .  Portacath placement  2011    right  . Tubal ligation  2010  . Breast biopsy  2011    right  . Hip arthroplasty  05/04/2012    Procedure: ARTHROPLASTY BIPOLAR HIP;  Surgeon: Shelda Pal, MD;  Location: [facility];  Service: Orthopedics;  Laterality: Right;  RIGHT HIP HEMI ARTHROPLASTY    Family History  Problem Relation Age of Onset  . Cancer Brother 33    died of colon cancer at age of 54  . High blood pressure Mother    History  Substance Use Topics  . Smoking status: Former Smoker -- 0.25 packs/day for 31 years    Types: Cigarettes    Quit date: 04/26/2012  . Smokeless tobacco: Never Used  . Alcohol Use: No   OB History   Grav Para Term Preterm Abortions TAB SAB Ect Mult Living                 Review of Systems  Constitutional: Negative for fever, diaphoresis, appetite change, fatigue and unexpected weight change.  HENT: Negative for mouth sores and neck stiffness.   Eyes: Negative for visual disturbance.  Respiratory: Negative for cough, chest tightness, shortness of breath and wheezing.   Cardiovascular: Negative for chest pain.  Gastrointestinal: Negative for nausea, vomiting, abdominal pain, diarrhea and constipation.  Endocrine: Negative for polydipsia, polyphagia and polyuria.  Genitourinary: Negative for dysuria, urgency, frequency and hematuria.  Musculoskeletal: Negative for back pain.  Skin: Negative for rash.  Allergic/Immunologic: Negative for immunocompromised state.  Neurological: Negative for syncope, light-headedness and headaches.  Hematological: Does not bruise/bleed easily.  Psychiatric/Behavioral: Negative for sleep disturbance. The patient is not nervous/anxious.     Allergies  Review of patient's allergies indicates no known allergies.  Home Medications   Current Outpatient Rx  Name  Route  Sig  Dispense  Refill  . cyclobenzaprine (FLEXERIL) 10 MG tablet   Oral   Take 1 tablet (10 mg total) by mouth 3 (three) times daily as needed for muscle  spasms.   30 tablet   0   . dexamethasone (DECADRON) 4 MG tablet   Oral   Take 2 tablets (8 mg total) by mouth 2 (two) times daily with a meal. [facility]   120 tablet   1   . dextrose 5 % SOLN 250 mL with morphine 25 MG/ML SOLN 250 mg   Intravenous   Inject 5 mg/hr into the vein continuous.   100 mg   0   . docusate sodium 100 MG CAPS   Oral   Take 200 mg by mouth 2 (two) times daily.   10 capsule   0   . exemestane (AROMASIN) 25 MG tablet   Oral   Take 1 tablet (25 mg total) by mouth daily after breakfast.   30 tablet   3     [facility]   . feeding supplement (ENSURE COMPLETE) LIQD   Oral   Take 237 mLs by mouth 4 (four) times daily.   30 Bottle   0   . fentaNYL (DURAGESIC - DOSED MCG/HR) 50 MCG/HR   Transdermal   Place 2 patches (100 mcg total) onto the skin every 3 (three) days.   10 patch   0   . LORazepam (ATIVAN) 1 MG tablet   Oral   Take 1 tablet (1 mg total) by mouth every 6 (six) hours as needed for anxiety.   30 tablet   0     SPOKE TO PHARMACIST.   [name] morphine (MSIR) 15 MG tablet   Oral   Take 1 tablet (15 mg total) by mouth every 2 (two) hours as needed for pain.   30 tablet   0     Hospice Patient   . omeprazole (PRILOSEC) 40 MG capsule   Oral   Take 1 capsule (40 mg total) by mouth daily.   30 capsule   3     [facility]   . ondansetron (ZOFRAN ODT) 4 MG disintegrating tablet      4mg  ODT q4 hours prn nausea/vomit   12 tablet   0   . polyethylene glycol (MIRALAX / GLYCOLAX) packet   Oral   Take 17 g by mouth daily.   14 each   0   . promethazine (PHENERGAN) 25 MG tablet   Oral   Take 1 tablet (25 mg total) by mouth every 6 (six) hours as needed for nausea. [facility]   30 tablet   1   . triamterene-hydrochlorothiazide (DYAZIDE) 37.5-25 MG per capsule   Oral   Take 1 each (1 capsule total) by mouth every morning.   30 capsule   12   . zolpidem (AMBIEN) 5  MG tablet   Oral   Take 5 mg by mouth at bedtime as needed for sleep.  BP 165/89  Pulse 94  Temp(Src) 99 F (37.2 C) (Oral)  Resp 20  Wt 124 lb (56.246 kg)  BMI 22.67 kg/m2  SpO2 100% Physical Exam  Nursing note and vitals reviewed. Constitutional: She appears well-developed and well-nourished. No distress.  Awake, alert, nontoxic appearance  HENT:  Head: Normocephalic and atraumatic.  Mouth/Throat: Oropharynx is clear and moist. No oropharyngeal exudate.  Eyes: Conjunctivae are normal. No scleral icterus.  Neck: Normal range of motion. Neck supple.  Cardiovascular: Normal rate, regular rhythm and intact distal pulses.   Pulmonary/Chest: Effort normal and breath sounds normal. No respiratory distress. She has no wheezes.  Abdominal: Soft. Bowel sounds are normal. She exhibits no distension. There is no tenderness.  Musculoskeletal: Normal range of motion. She exhibits no edema.  Neurological: She is alert.  Speech is clear and goal oriented Moves extremities without ataxia  Skin: Skin is warm and dry. She is not diaphoretic. No erythema.  PICC line dislodged approximately 6 inches No erythema, induration or warmth to the site. Clean dry dressing in place without evidence of drainage.  Psychiatric: She has a normal mood and affect.    ED Course   Procedures (including critical care time)  Labs Reviewed - No data to display No results found. 1. PICC (peripherally inserted central catheter) removal     MDM  [name] presents with PICC dysfunction.  Pt no longer receiving morphine drip through PICC and it is "bothering her." Will d/c PICC line as is has pulled out 6+ inches.    PICC line removal without complication.  Pt to be d/c home with oncology f/u.  Pt alert, oriented, nontoxic, nonseptic appearing in NAD.  I have also discussed reasons to return immediately to the ER.  Patient expresses understanding and agrees with plan.  Dr. Azalia Bilis was  consulted, evaluated this patient with me and agrees with the plan.      Dahlia Client Annet Manukyan, PA-C 01/23/13 [phone]

## 2013-01-22 NOTE — ED Notes (Signed)
Per EMS: Pt is a cancer pt. And phoned EMS r/t her P.I.C.C. Line is simply beginning to come out.  Dry drsng. In place at present with no visible bleeding.  She is in no distress.

## 2013-01-22 NOTE — ED Notes (Signed)
MD at bedside. 

## 2013-01-22 NOTE — ED Notes (Signed)
Pt called for triage x2. Pt on street smoking

## 2013-01-24 ENCOUNTER — Telehealth: Payer: Self-pay | Admitting: *Deleted

## 2013-01-24 MED ORDER — MORPHINE SULFATE 15 MG PO TABS: 15.0000 mg | ORAL_TABLET | ORAL | Status: DC | PRN

## 2013-01-24 NOTE — Telephone Encounter (Signed)
This RN spoke with pt per her call - Gloriann handed the phone to the hospice RN who was in her home at the moment.  Per discussion with Pam RN- pt is inquiring about MRI result and need for radiation- ( Quanisha states she received call but wanted to clarify about the plan of care ).  This RN reviewed note per RN with Dr Michell Heinrich verifying radiation not needed and referral for neuro for possible benefit.  Otherwise Pam states Kloi needs a refill on MSIR ( only 30 dispensed last week ). Noted enlargement of abdomen with concern that she may need a paracentesis.  Pam also inquired per appt in this office per follow up- noted prior appt cancelled due to work up with radiation oncology - appt made for this Thursday with [facility]/NP for follow up for potential need to schedule a paracentesis.  Refill for MSIR will be obtained with greater dispense amount.

## 2013-01-24 NOTE — Telephone Encounter (Signed)
Called Ms Epstein per Dr.Wentworth, her MRI looks great and there isn't anything to explain why her eye is action that way, MD will be speaking with a Neuosurgeon  And will call her as soon as she knows something, patient said okay,thank Dr.Wentworth for the news and for the call  10:03 AM

## 2013-01-24 NOTE — ED Provider Notes (Signed)
Medical screening examination/treatment/procedure(s) were conducted as a shared visit with non-physician practitioner(s) and myself.  I personally evaluated the patient during the encounter  No longer requires PICC. No signs of infection. Removed for patient safety. Removed in its entirety without difficulty  Lyanne Co, MD 01/24/13 2253

## 2013-01-26 ENCOUNTER — Encounter: Payer: Self-pay | Admitting: Family

## 2013-01-26 ENCOUNTER — Ambulatory Visit (HOSPITAL_BASED_OUTPATIENT_CLINIC_OR_DEPARTMENT_OTHER): Payer: Medicaid Other | Admitting: Family

## 2013-01-26 ENCOUNTER — Telehealth: Payer: Self-pay | Admitting: *Deleted

## 2013-01-26 DIAGNOSIS — C7951 Secondary malignant neoplasm of bone: Secondary | ICD-10-CM

## 2013-01-26 DIAGNOSIS — C50919 Malignant neoplasm of unspecified site of unspecified female breast: Secondary | ICD-10-CM

## 2013-01-26 DIAGNOSIS — K5900 Constipation, unspecified: Secondary | ICD-10-CM

## 2013-01-26 DIAGNOSIS — G893 Neoplasm related pain (acute) (chronic): Secondary | ICD-10-CM

## 2013-01-26 MED ORDER — FENTANYL 50 MCG/HR TD PT72: 2.0000 | MEDICATED_PATCH | TRANSDERMAL | Status: DC

## 2013-01-26 NOTE — Telephone Encounter (Signed)
appts made and printed. Pt is aware that cs will call her w/ her [place] appt. Pt is aware that i lm w./Chris @ the neuro office and waiting for them to return my call...td

## 2013-01-26 NOTE — Progress Notes (Signed)
[facility]  [phone] [phone]  OFFICE PROGRESS NOTE   ID: MIDA CORY   DOB: 1966/01/18  MR#: [id number]  GEX#:[id number]  PCP: Pcp Not In System   HISTORY OF PRESENT ILLNESS: The patient delivered a child on 05/17/2009, after which she felt fullness in the right breast. She eventually brought this to her doctors attention and was scheduled for mammography and ultrasound on 03/08/2009. There were no prior exams for comparison. Mammography showed the breast to be dense, with a 5 cm obscured mass in the outer midportion of the right breast. Mass was easily palpable, and there was also a mass in the outer right periareolar region. They were suspicious microcalcifications in the outer midportion of the right breast, as well as within the mass itself. Ultrasound showed an extensive heterogeneous area, corresponding to the palpable abnormality with multiple solid and cystic foci. Overall, the mass measured in excess of 5 cm, in addition to cysts in the breast.   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 The tumors were ER +100%, PR +80%. Bilateral breast MRI was obtained 03/22/2009. There was a large area of abnormal enhancement in the lower outer quadrant of the right breast measuring up to 9.7 cm. No abnormal appearing lymph nodes and no other masses in either breast.  Patient underwent definitive right mastectomy and sentinel lymph node sampling on 04/29/2009 under the care of Dr. Derrell Lolling. 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 Repeat prognostic panel showed tumor to be ER +93% and PR +51%. HER-2/neu was negative with a ratio of 0.71, with proliferation marker of 36%.   Patient was evaluated by Dr. Darnelle Catalan in January 2011 but failed to keep appointments  until she was seen here again in July 2011. In July, she began adjuvant chemotherapy. She received 4 cycles of dose dense doxorubicin and cyclophosphamide followed by 4 weekly doses of paclitaxel which was discontinued in November 2011 secondary to peripheral neuropathy. The patient then received radiation therapy to the right chest wall which was truncated due to problems with missing appointments.   Patient was started on Tamoxifen briefly in June of 2012 which was discontinued due to nausea. The nausea continued, and an abdominal CT in October 2012 confirmed a growth in liver lesions. These were biopsied on 03/10/2011 and showed metastatic adenocarcinoma, still ER positive at 100%, PR +5%. Her subsequent treatments are as detailed below  INTERVAL HISTORY: Ms. Wesch returns today for followup of her metastatic breast carcinoma. She is currently followed by hospice and states her nurse visits him at home twice weekly.  She was last seen by [place] on 01/05/2013. Her interval history is significant for developing eye problems which she states began in 11/2012.   Her interval history is also significant for going to the emergency room on 01/22/2013 for PICC line dislodgment and subsequent removal.   REVIEW OF SYSTEMS: Ms. Bresnan's  pain is well-controlled on her current regimen of 2 Fentanyl 50 mcg patches (100 mcg total) Q72 hours and MSIR 15 mg by mouth every Q2, PRN for pain.  She continues to be constipated. She does not have a bowel movement every day.  She is taking MiraLAX daily for constipation.  She is complaining of pain in her right mid and lower back area, right hands and right flank area.  Her abdomen is  quite distended and therapeutic paracentesis will be ordered.  She has minimal nausea, no vomiting, no taste alteration, no chest pain or pressure, no cough, phlegm production, or pleurisy. Her left eye is covered by a patch.  She has numerous scabs on her arms and legs in various stages of healing  which she says is as a result of renting bedroom suite from rental center that was infested with bedbugs.  A detailed review of systems was otherwise noncontributory.  PAST MEDICAL HISTORY: Past Medical History  Diagnosis Date  . Hypertension   . GERD (gastroesophageal reflux disease)   . 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 Pyelonephritis 10/01/11  . Recurrent UTI (urinary tract infection) 10/01/11  . S/P chemotherapy, time since 4-12 weeks   . S/P radiation > 12 weeks 03/15/12- 04/07/12    right hip, clivus  . 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 Brain cancer     spread from breast ca  . Hypercalcemia 06/21/2012  . History of radiation therapy 08/06/10 -09/16/10    R supraclav fossa, R chest wall, R drain site  . radiaiton therapy 07/22/12 -08/15/12    T4-T7    PAST SURGICAL HISTORY: Past Surgical History  Procedure Laterality Date  . Cesarean section  1989; 2010  . Mastectomy  2011    right  . Back surgery  2012    "removed tumor,cancer, from my spine  . Port-a-cath removal  2012    right chest  . Portacath placement  2011    right  . Tubal ligation  2010  . Breast biopsy  2011    right  . Hip arthroplasty  05/04/2012    Procedure: ARTHROPLASTY BIPOLAR HIP;  Surgeon: Shelda Pal, MD;  Location: [facility];  Service: Orthopedics;  Laterality: Right;  RIGHT HIP HEMI ARTHROPLASTY     FAMILY HISTORY Family History  Problem Relation Age of Onset  . Cancer Brother 55    died of colon cancer at age of 16  . High blood pressure Mother   The patient's father is alive at age 77.  The patient's mother is alive at age 97.  She has four sisters.  She had one brother who died from colon cancer at the age of 28.  There is no breast or ovarian cancer history in the family to her knowledge.   GYNECOLOGIC HISTORY: She is GX P2.  First pregnancy to term at age 28.    SOCIAL HISTORY: The patient is not employed.  She lives by herself (her small  son whose name is [name] lives with his dad in [place]).  Her first child, [name], lives in this area. She and her daughter are not in frequent contact, but the patient states "we do talk". The patient has one grandchild.  She attends the [employer].    ADVANCED DIRECTIVES: At our facility DO NOT RESUSCITATE is in place  HEALTH MAINTENANCE: History  Substance Use Topics  . Smoking status: Former Smoker -- 0.25 packs/day for 31 years    Types: Cigarettes    Quit date: 04/26/2012  . Smokeless tobacco: Never Used  . Alcohol Use: No     Colonoscopy: Not on file  PAP: Not on file  Bone density: Not on file  Lipid panel: Not on file  No Known Allergies  Current Outpatient Prescriptions  Medication Sig Dispense Refill  . cyclobenzaprine (FLEXERIL) 10 MG tablet Take 1 tablet (  10 mg total) by mouth 3 (three) times daily as needed for muscle spasms.  30 tablet  0  . dexamethasone (DECADRON) 4 MG tablet Take 2 tablets (8 mg total) by mouth 2 (two) times daily with a meal. [facility]  120 tablet  1  . docusate sodium 100 MG CAPS Take 200 mg by mouth 2 (two) times daily.  10 capsule  0  . exemestane (AROMASIN) 25 MG tablet Take 1 tablet (25 mg total) by mouth daily after breakfast.  30 tablet  3  . feeding supplement (ENSURE COMPLETE) LIQD Take 237 mLs by mouth 4 (four) times daily.  30 Bottle  0  . fentaNYL (DURAGESIC - DOSED MCG/HR) 50 MCG/HR Place 2 patches (100 mcg total) onto the skin every 3 (three) days.  10 patch  0  . LORazepam (ATIVAN) 1 MG tablet Take 1 tablet (1 mg total) by mouth every 6 (six) hours as needed for anxiety.  30 tablet  0  . morphine (MSIR) 15 MG tablet Take 1 tablet (15 mg total) by mouth every 2 (two) hours as needed for pain.  60 tablet  0  . omeprazole (PRILOSEC) 40 MG capsule Take 1 capsule (40 mg total) by mouth daily.  30 capsule  3  . ondansetron (ZOFRAN ODT) 4 MG disintegrating tablet 4mg  ODT q4 hours prn nausea/vomit  12 tablet  0  .  polyethylene glycol (MIRALAX / GLYCOLAX) packet Take 17 g by mouth daily.  14 each  0  . promethazine (PHENERGAN) 25 MG tablet Take 1 tablet (25 mg total) by mouth every 6 (six) hours as needed for nausea. HOSPICE OF  PT  30 tablet  1  . triamterene-hydrochlorothiazide (DYAZIDE) 37.5-25 MG per capsule Take 1 each (1 capsule total) by mouth every morning.  30 capsule  12  . zolpidem (AMBIEN) 5 MG tablet Take 5 mg by mouth at bedtime as needed for sleep.      . [DISCONTINUED] Alum & Mag Hydroxide-Simeth (MAGIC MOUTHWASH W/LIDOCAINE) SOLN Take 5 mLs by mouth 3 (three) times daily as needed (take 15 minutes prior to meals.).  480 mL  0   No current facility-administered medications for this visit.    OBJECTIVE: Middle-aged [place] American woman who ambulates with a cane: Filed Vitals:   01/26/13 1411  BP: 135/77  Pulse: 101  Temp: 98.5 F (36.9 C)  Resp: 20     Body mass index is 22.2 kg/(m^2).    ECOG: 2 Filed Weights   01/26/13 1411  Weight: 121 lb 6.4 oz (55.067 kg)   General appearance: Alert, cooperative, thin frame, mild distress at times Head: Normocephalic, without obvious abnormality, atraumatic Eyes: Icteric bilaterally, left eye amblyopia and strabismus, left eye sluggish pupillary response Nose: Nares, septum and mucosa are normal, no drainage or sinus tenderness Neck: No adenopathy, supple, symmetrical, trachea midline, thyroid not enlarged, no tenderness Resp: Clear to auscultation bilaterally Cardio: Regular rate and rhythm, S1, S2 normal, no murmur, click, rub or gallop Breasts:  Deferred GI: Firm, distended, diffuse tenderness, hypoactive bowel sounds, unable to fully assess organomegaly due to distention and tenderness Skin: Bilateral upper and lower extremity scabs/lesions in various stages of healing Extremities: Extremities normal, atraumatic, no cyanosis or edema Lymph nodes: Cervical, supraclavicular, and axillary nodes normal Neurologic: Grossly  normal, anxious and tearful at times   LAB RESULTS: Lab Results  Component Value Date   WBC 8.4 12/13/2012   NEUTROABS 7.2* 10/31/2012   HGB 9.4* 12/13/2012  HCT 29.1* 12/13/2012   MCV 80.8 12/13/2012   PLT 366 12/13/2012      Chemistry      Component Value Date/Time   NA 138 12/13/2012 1857   NA 137 10/31/2012 1427   K 3.7 12/13/2012 1857   K 3.9 10/31/2012 1427   CL 102 12/13/2012 1857   CL 98 10/31/2012 1427   CO2 22 12/13/2012 1857   CO2 29 10/31/2012 1427   BUN 18 12/13/2012 1857   BUN 14.7 10/31/2012 1427   CREATININE 0.64 12/13/2012 1857   CREATININE 0.7 10/31/2012 1427      Component Value Date/Time   CALCIUM 9.2 12/13/2012 1857   CALCIUM 9.4 10/31/2012 1427   ALKPHOS 841* 12/13/2012 1857   ALKPHOS 1,005* 10/31/2012 1427   AST 64* 12/13/2012 1857   AST 128* 10/31/2012 1427   ALT 47* 12/13/2012 1857   ALT 57* 10/31/2012 1427   BILITOT 0.4 12/13/2012 1857   BILITOT 1.03 10/31/2012 1427       Lab Results  Component Value Date   LABCA2 89* 06/09/2012    STUDIES: No results found.   ASSESSMENT:  47 y.o.  [place] woman   1.  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   2. Status post adjuvant chemotherapy July to November 2011, consisting of 4 cycles of dose dense Doxorubicin and Cyclophosphamide, followed by 4 of 12 planned weekly doses of Paclitaxel given adjuvantly, discontinued secondary to peripheral neuropathy.   3. Status post radiation to the right chest wall, truncated due to problems with the patient missing appointments.  4. Tamoxifen started June of 2012, but discontinued due to nausea.   5. Metastatic disease to liver and bone pathologically documented October of 2012, estrogen receptor 100% positive, progesterone receptor 7% positive, with no HER-2 amplification;  treatment in the metastatic  setting has consisted of:  6.  Faslodex started 03/16/2011, discontinued November 2013 with progression. Zoladex every 3 months also started on 03/16/2011, and monthly Zoledronic acid started November 2011, the Zometa I currently held due to pending dental procedures and possible extractions.            7. Status post thoracic laminectomy, T9-T11, for impending cord compression on 03/21/2011, followed by irradiation to the spine completed November 2011  8. 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 s/p palliative radiation to the Right hip clivus to 30 Gy completed 04/07/2012  10.  Progression noted by scans in November 2013, at which time Faslodex was discontinued. Patient was started on Exemestane in early December, with Everolimus started in mid December at 5 mg daily.  Continues to receive Zoladex injections every 3 months, last given 09/05/2012.  11. Nondisplaced right femoral neck fracture, status post right hip arthroplasty on 05/04/2012 under the care of Dr.[facility]  12. Hypercalcemia with elevated serum creatinine: resolved  13.  S/p palliative radiation therapy for cord compression completed 08/15/2012  14. Poorly controlled pain  15. Under hospice care, out of facility DO NOT RESUSCITATE in place     PLAN:  The patient was provided with a written prescription for Fentanyl patch 50 mcg, place 2 patches on the skin every 3 days #10 patches with 0 refills.   Bowel prophylaxis was again discussed and she  understands she needs to take 2 stool softeners twice daily, MiraLAX daily, and use a suppository or enema if she has not had a bowel movement the third day.  She requested and we will schedule a nutritional consult with dietitian Zenovia Jarred to discuss supplemental nutrition including Ensure.  We will schedule her for a therapeutic paracentesis for abdominal  discomfort.  A neurology consult has also been requested for the patient for evaluation of her left eye.  We plan to see Ms. Duchesneau again in 6 weeks at which time we will check laboratories of CBC and CMP.  All questions answered.  Ms. Jepsen was encouraged to contact us in the interim with any concerns, problems, or questions.    Larina Bras, NP-C 01/28/2013 12:42 PM

## 2013-01-26 NOTE — Patient Instructions (Addendum)
Please contact us at [phone] if you have any questions or concerns.  Please continue to do well and enjoy life!!!  Get plenty of rest, drink plenty of water, exercise daily (walking), eat a balanced diet.

## 2013-01-27 ENCOUNTER — Telehealth: Payer: Self-pay | Admitting: *Deleted

## 2013-01-27 NOTE — Telephone Encounter (Signed)
i have faxed all the requested info to [facility] so the pt can get an appt....td

## 2013-01-31 ENCOUNTER — Other Ambulatory Visit: Payer: Self-pay | Admitting: *Deleted

## 2013-01-31 ENCOUNTER — Telehealth: Payer: Self-pay | Admitting: *Deleted

## 2013-01-31 NOTE — Telephone Encounter (Signed)
This RN spoke with Elijah Birk- RN with [facility] informing her of time for theraputic paracentesis for this Thursday at 11am at [facility].

## 2013-02-02 ENCOUNTER — Ambulatory Visit (HOSPITAL_COMMUNITY): Admission: RE | Admit: 2013-02-02 | Payer: Medicaid Other | Source: Ambulatory Visit

## 2013-02-03 ENCOUNTER — Telehealth: Payer: Self-pay | Admitting: *Deleted

## 2013-02-03 NOTE — Telephone Encounter (Signed)
Message left by Elijah Birk RN with [facility] stating pt has had increased episodes " of just crying a lot " - and Pam is inquiring if MD feels pt would benefit from an antidepressant.  Return call given for Pam is [phone].

## 2013-02-06 ENCOUNTER — Other Ambulatory Visit: Payer: Self-pay | Admitting: Family

## 2013-02-06 ENCOUNTER — Ambulatory Visit (HOSPITAL_COMMUNITY)
Admission: RE | Admit: 2013-02-06 | Discharge: 2013-02-06 | Disposition: A | Discharge status: Home / Self Care | Payer: Medicaid Other | Source: Ambulatory Visit | Attending: Family | Admitting: Family

## 2013-02-06 DIAGNOSIS — C7951 Secondary malignant neoplasm of bone: Secondary | ICD-10-CM | POA: Insufficient documentation

## 2013-02-06 DIAGNOSIS — C50919 Malignant neoplasm of unspecified site of unspecified female breast: Secondary | ICD-10-CM | POA: Insufficient documentation

## 2013-02-06 NOTE — Progress Notes (Signed)
Patient ID: [name], female   DOB: 1965/07/31, 47 y.o.   MRN: [id number] Pt presented to [place] dept today for paracentesis. Limited US abd in all four quadrants revealed no significant ascites. Procedure was cancelled and pt notified.

## 2013-02-08 ENCOUNTER — Telehealth: Payer: Self-pay | Admitting: *Deleted

## 2013-02-08 ENCOUNTER — Other Ambulatory Visit: Payer: Self-pay | Admitting: *Deleted

## 2013-02-08 ENCOUNTER — Encounter: Payer: Medicaid Other | Admitting: Nutrition

## 2013-02-08 DIAGNOSIS — C50919 Malignant neoplasm of unspecified site of unspecified female breast: Secondary | ICD-10-CM

## 2013-02-08 MED ORDER — MORPHINE SULFATE 15 MG PO TABS: 15.0000 mg | ORAL_TABLET | ORAL | Status: DC | PRN

## 2013-02-08 MED ORDER — FENTANYL 50 MCG/HR TD PT72: 2.0000 | MEDICATED_PATCH | TRANSDERMAL | Status: DC

## 2013-02-08 NOTE — Telephone Encounter (Signed)
Patient is out of MSIR and has placed the last two pain patches today.  Will notify MD

## 2013-02-21 ENCOUNTER — Other Ambulatory Visit: Payer: Self-pay | Admitting: *Deleted

## 2013-02-21 MED ORDER — MORPHINE SULFATE 15 MG PO TABS: ORAL_TABLET | ORAL | Status: DC

## 2013-02-27 ENCOUNTER — Other Ambulatory Visit: Payer: Self-pay | Admitting: *Deleted

## 2013-02-27 DIAGNOSIS — C50919 Malignant neoplasm of unspecified site of unspecified female breast: Secondary | ICD-10-CM

## 2013-02-27 MED ORDER — FENTANYL 50 MCG/HR TD PT72: 2.0000 | MEDICATED_PATCH | TRANSDERMAL | Status: DC

## 2013-02-28 ENCOUNTER — Other Ambulatory Visit: Payer: Self-pay | Admitting: *Deleted

## 2013-02-28 DIAGNOSIS — C50919 Malignant neoplasm of unspecified site of unspecified female breast: Secondary | ICD-10-CM

## 2013-02-28 MED ORDER — ZOLPIDEM TARTRATE 5 MG PO TABS: 5.0000 mg | ORAL_TABLET | Freq: Every evening | ORAL | Status: DC | PRN

## 2013-03-09 ENCOUNTER — Other Ambulatory Visit: Payer: Self-pay | Admitting: *Deleted

## 2013-03-09 DIAGNOSIS — C50919 Malignant neoplasm of unspecified site of unspecified female breast: Secondary | ICD-10-CM

## 2013-03-09 MED ORDER — MORPHINE SULFATE 15 MG PO TABS: ORAL_TABLET | ORAL | Status: DC

## 2013-03-09 MED ORDER — FENTANYL 50 MCG/HR TD PT72: 2.0000 | MEDICATED_PATCH | TRANSDERMAL | Status: DC

## 2013-03-14 ENCOUNTER — Other Ambulatory Visit: Payer: Self-pay | Admitting: *Deleted

## 2013-03-14 DIAGNOSIS — C7951 Secondary malignant neoplasm of bone: Secondary | ICD-10-CM

## 2013-03-14 MED ORDER — CYCLOBENZAPRINE HCL 10 MG PO TABS: 10.0000 mg | ORAL_TABLET | Freq: Three times a day (TID) | ORAL | Status: DC | PRN

## 2013-03-14 MED ORDER — POLYETHYLENE GLYCOL 3350 17 G PO PACK: 17.0000 g | PACK | Freq: Every day | ORAL | Status: DC

## 2013-03-16 ENCOUNTER — Telehealth: Payer: Self-pay | Admitting: *Deleted

## 2013-03-16 NOTE — Telephone Encounter (Signed)
This Rn was contacted by Pam RN with [facility]- she wanted MD to know of concerns she assessed with pt.  Pt is having increased episodes of " not remembering" related to activities of daily living including eating and drinking or where she puts her medication. Today pt multiple times dropped medication that Pam was giving her and then not knowing she needed to pick them up.  She is alert and oriented,not confused. She is staying awake at night and sleeping in the day.  Her breakthrough pain medication bottle opened up in her purse - which Pam was able to assess with noted " dirty pills " being pulled out of the bottom or her purse.  Ambriella states she has fallen twice that she states she doesn't remember how she fell she was just aware that she had fallen.  She has misplaced her medication box due to above as well as pt continually " reorganizes stuff " but really is just moving junk around ( like a nervous habit ).  She is having mood swings from being labile to crying to anxiety.  Pam is unsure if above is an emotional mental issue, or due to missed doses of medication which is assessed due to meds still in med box, or progression of disease.  This RN discussed with Pam- concern with above and need for pt to be in a structured enviroment. Carigan does not have a constant caregiver. Her niece is living in her home but not invested in the care of [place].  Plan per discussion is above will be discussed with MD. Elita Quick will address structured enviroment with pt who with previous admittance to [facility] was very noncompliant in her stay and communication. To resume residence at [facility] may be difficult unless pt is ready to " buy" into care provided by their facility.  Return call number for Pam given as [phone].

## 2013-03-17 ENCOUNTER — Encounter (HOSPITAL_COMMUNITY): Payer: Self-pay | Admitting: Emergency Medicine

## 2013-03-17 ENCOUNTER — Other Ambulatory Visit: Payer: Self-pay | Admitting: *Deleted

## 2013-03-17 ENCOUNTER — Emergency Department (HOSPITAL_COMMUNITY)
Admission: EM | Admit: 2013-03-17 | Discharge: 2013-03-17 | Disposition: A | Discharge status: Home / Self Care | Payer: Medicaid Other | Attending: Emergency Medicine | Admitting: Emergency Medicine

## 2013-03-17 ENCOUNTER — Telehealth: Payer: Self-pay | Admitting: *Deleted

## 2013-03-17 DIAGNOSIS — I10 Essential (primary) hypertension: Secondary | ICD-10-CM | POA: Insufficient documentation

## 2013-03-17 DIAGNOSIS — C787 Secondary malignant neoplasm of liver and intrahepatic bile duct: Secondary | ICD-10-CM | POA: Insufficient documentation

## 2013-03-17 DIAGNOSIS — Z853 Personal history of malignant neoplasm of breast: Secondary | ICD-10-CM | POA: Insufficient documentation

## 2013-03-17 DIAGNOSIS — C7951 Secondary malignant neoplasm of bone: Secondary | ICD-10-CM | POA: Insufficient documentation

## 2013-03-17 DIAGNOSIS — Z87440 Personal history of urinary (tract) infections: Secondary | ICD-10-CM | POA: Insufficient documentation

## 2013-03-17 DIAGNOSIS — C50919 Malignant neoplasm of unspecified site of unspecified female breast: Secondary | ICD-10-CM

## 2013-03-17 DIAGNOSIS — Z79899 Other long term (current) drug therapy: Secondary | ICD-10-CM | POA: Insufficient documentation

## 2013-03-17 DIAGNOSIS — Z87891 Personal history of nicotine dependence: Secondary | ICD-10-CM | POA: Insufficient documentation

## 2013-03-17 DIAGNOSIS — C7931 Secondary malignant neoplasm of brain: Secondary | ICD-10-CM | POA: Insufficient documentation

## 2013-03-17 DIAGNOSIS — Z9889 Other specified postprocedural states: Secondary | ICD-10-CM | POA: Insufficient documentation

## 2013-03-17 DIAGNOSIS — Z923 Personal history of irradiation: Secondary | ICD-10-CM | POA: Insufficient documentation

## 2013-03-17 DIAGNOSIS — Z862 Personal history of diseases of the blood and blood-forming organs and certain disorders involving the immune mechanism: Secondary | ICD-10-CM | POA: Insufficient documentation

## 2013-03-17 DIAGNOSIS — N898 Other specified noninflammatory disorders of vagina: Secondary | ICD-10-CM | POA: Insufficient documentation

## 2013-03-17 DIAGNOSIS — G8929 Other chronic pain: Secondary | ICD-10-CM | POA: Insufficient documentation

## 2013-03-17 DIAGNOSIS — Z8639 Personal history of other endocrine, nutritional and metabolic disease: Secondary | ICD-10-CM | POA: Insufficient documentation

## 2013-03-17 DIAGNOSIS — Z9221 Personal history of antineoplastic chemotherapy: Secondary | ICD-10-CM | POA: Insufficient documentation

## 2013-03-17 LAB — GC/CHLAMYDIA PROBE AMP
CT Probe RNA: NEGATIVE
GC Probe RNA: NEGATIVE

## 2013-03-17 LAB — WET PREP, GENITAL

## 2013-03-17 MED ORDER — FENTANYL 50 MCG/HR TD PT72: 2.0000 | MEDICATED_PATCH | TRANSDERMAL | Status: DC

## 2013-03-17 MED ORDER — MORPHINE SULFATE 15 MG PO TABS: ORAL_TABLET | ORAL | Status: DC

## 2013-03-17 MED ORDER — FLUCONAZOLE 150 MG PO TABS: 150.0000 mg | ORAL_TABLET | Freq: Once | ORAL | Status: DC

## 2013-03-17 MED ORDER — METRONIDAZOLE 500 MG PO TABS: 500.0000 mg | ORAL_TABLET | Freq: Two times a day (BID) | ORAL | Status: DC

## 2013-03-17 MED ORDER — HYDROMORPHONE HCL PF 2 MG/ML IJ SOLN
2.0000 mg | Freq: Once | INTRAMUSCULAR | Status: AC
  Administered 2013-03-17: 2 mg via INTRAMUSCULAR
  Filled 2013-03-17: qty 1

## 2013-03-17 NOTE — ED Notes (Signed)
Bed: WA09 Expected date:  Expected time:  Means of arrival:  Comments: EMS-weak-med refill

## 2013-03-17 NOTE — Progress Notes (Addendum)
Room [facility] ED [name] - [facility] RN Visit-[name] RN  Related admission to [facility] diagnosis of metastatic breast cancer (mets to brain, spine, bone, liver).  Pt is DNR code.    Pt alert. lying on ED stretcher, with complaints of needing her pain patches and pain meds.  Checked with pharmacy-pt picked up 10 Fentanyl patches 10/9 (2 q/72 hrs) and MS #60 (at max a 5 day supply).    No family present.  Patient's home medication list is on shadow chart.   Please see telephone note of 10/16 to [facility]-re: SAFETY & confusion.   Pt to be seen today by [facility] RN in the home when pt discharged.   Please call [facility] @ [phone]- ask for RN Liaison or after hours,ask for on-call RN with any hospice needs.   Thank you.  Joneen Boers, RN  [facility]  Hospice Liaison  [phone])

## 2013-03-17 NOTE — ED Provider Notes (Signed)
Medical screening examination/treatment/procedure(s) were performed by non-physician practitioner and as supervising physician I was immediately available for consultation/collaboration. Devoria Albe, MD, Armando Gang   Ward Givens, MD 03/17/13 1556

## 2013-03-17 NOTE — ED Notes (Signed)
Pt complains of generalized body pain. Pt has liver ca, and took last dose of po pain med yesterday, thought she had more for today, but ran out. Pt also complains of thick vaginal discharge. Pt has 2 fentanyl patches on R deltoid. Pt denies any chemo or radiation at this time.

## 2013-03-17 NOTE — Telephone Encounter (Signed)
Per review with MD - plan is to obtain an MRI then follow up for discussion and plan of care.  This RN called to MRI and obtained appointment for 03/22/2013 at 1030am pt to be there at 10am. Note this time is for an " inpt" slot and pt may be delayed if patient in house needs time.  This RN noted pt is already scheduled for MD on 10/22 at 4pm.  Called to [facility] and discussed the above. She will communicate above with [facility] SW - melanie, to arrange transportation for appointments. Someone from hospice may attend meeting with pt and MD for plan of care.  Per discussion pt will proceed to the ER if symptoms worsen.  No other needs at this time.

## 2013-03-17 NOTE — ED Provider Notes (Signed)
CSN: [id number]     Arrival date & time 03/17/13  0846 History   First MD Initiated Contact with Patient 03/17/13 [phone]     Chief Complaint  Patient presents with  . Generalized Body Aches  . Vaginal Discharge   (Consider location/radiation/quality/duration/timing/severity/associated sxs/prior Treatment) HPI Comments: Patient with metastatic cancer, presents emergency department with chief complaint of generalized pain. Patient states that she ran out of her last pain pill last night. She states that she is uncertain when her next followup appointment as, but states that she has hospice appointment today, and that they will be to sort out the patient's pain medicine. Patient is requesting her dose pain medicine here, but is not requesting any prescriptions. Additionally, she states that she has had some new vaginal discharge, which should like to have evaluated. She states that she has chronic abdominal pain, but this is not new or different for her. She is no longer receiving any chemotherapy or radiation therapies. She states that she takes morphine and has fentanyl patches for pain. She denies fevers or chills.  The history is provided by the patient. No language interpreter was used.    Past Medical History  Diagnosis Date  . Hypertension   . GERD (gastroesophageal reflux disease)   . 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 Pyelonephritis 10/01/11  . Recurrent UTI (urinary tract infection) 10/01/11  . S/P chemotherapy, time since 4-12 weeks   . S/P radiation > 12 weeks 03/15/12- 04/07/12    right hip, clivus  . 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 Brain cancer     spread from breast ca  . Hypercalcemia 06/21/2012  . History of radiation therapy 08/06/10 -09/16/10    R supraclav fossa, R chest wall, R drain site  . radiaiton therapy 07/22/12 -08/15/12    T4-T7   Past Surgical History  Procedure Laterality Date  . Cesarean section  1989; 2010  .  Mastectomy  2011    right  . Back surgery  2012    "removed tumor,cancer, from my spine  . Port-a-cath removal  2012    right chest  . Portacath placement  2011    right  . Tubal ligation  2010  . Breast biopsy  2011    right  . Hip arthroplasty  05/04/2012    Procedure: ARTHROPLASTY BIPOLAR HIP;  Surgeon: Shelda Pal, MD;  Location: [facility];  Service: Orthopedics;  Laterality: Right;  RIGHT HIP HEMI ARTHROPLASTY    Family History  Problem Relation Age of Onset  . Cancer Brother 63    died of colon cancer at age of 24  . High blood pressure Mother    History  Substance Use Topics  . Smoking status: Former Smoker -- 0.25 packs/day for 31 years    Types: Cigarettes    Quit date: 04/26/2012  . Smokeless tobacco: Never Used  . Alcohol Use: No   OB History   Grav Para Term Preterm Abortions TAB SAB Ect Mult Living                 Review of Systems  All other systems reviewed and are negative.    Allergies  Review of patient's allergies indicates no known allergies.  Home Medications   Current Outpatient Rx  Name  Route  Sig  Dispense  Refill  . cyclobenzaprine (FLEXERIL) 10 MG tablet   Oral   Take 1 tablet (  10 mg total) by mouth 3 (three) times daily as needed for muscle spasms.   60 tablet   2   . dexamethasone (DECADRON) 4 MG tablet   Oral   Take 4 mg by mouth 2 (two) times daily with a meal. [facility]         . docusate sodium 100 MG CAPS   Oral   Take 200 mg by mouth 2 (two) times daily.   10 capsule   0   . exemestane (AROMASIN) 25 MG tablet   Oral   Take 1 tablet (25 mg total) by mouth daily after breakfast.   30 tablet   3     [facility]   . feeding supplement (ENSURE COMPLETE) LIQD   Oral   Take 237 mLs by mouth 4 (four) times daily.   30 Bottle   0   . fentaNYL (DURAGESIC - DOSED MCG/HR) 50 MCG/HR   Transdermal   Place 2 patches (100 mcg total) onto the skin every 3 (three) days.   10 patch   0      Hospice patient   . LORazepam (ATIVAN) 1 MG tablet   Oral   Take 1 tablet (1 mg total) by mouth every 6 (six) hours as needed for anxiety.   30 tablet   0     SPOKE TO PHARMACIST.   [name] morphine (MSIR) 15 MG tablet      May take 1-2 tabs po q 4 hours as needed   60 tablet   0     Hospice Patient   . omeprazole (PRILOSEC) 40 MG capsule   Oral   Take 1 capsule (40 mg total) by mouth daily.   30 capsule   3     [facility] [place] [facility]   . ondansetron (ZOFRAN ODT) 4 MG disintegrating tablet      4mg  ODT q4 hours prn nausea/vomit   12 tablet   0   . polyethylene glycol (MIRALAX / GLYCOLAX) packet   Oral   Take 17 g by mouth daily.   14 each   2   . promethazine (PHENERGAN) 25 MG tablet   Oral   Take 1 tablet (25 mg total) by mouth every 6 (six) hours as needed for nausea. [facility]   30 tablet   1   . triamterene-hydrochlorothiazide (DYAZIDE) 37.5-25 MG per capsule   Oral   Take 1 each (1 capsule total) by mouth every morning.   30 capsule   12   . zolpidem (AMBIEN) 5 MG tablet   Oral   Take 1 tablet (5 mg total) by mouth at bedtime as needed for sleep.   30 tablet   0     CALLED TO PHARMACIST.    BP 172/98  Pulse 88  Temp(Src) 97.7 F (36.5 C) (Oral)  Resp 20  SpO2 100% Physical Exam  Nursing note and vitals reviewed. Constitutional: She is oriented to person, place, and time. She appears well-developed and well-nourished.  HENT:  Head: Normocephalic and atraumatic.  Eyes: Conjunctivae and EOM are normal. Pupils are equal, round, and reactive to light.  Neck: Normal range of motion. Neck supple.  Cardiovascular: Normal rate and regular rhythm.  Exam reveals no gallop and no friction rub.   No murmur heard. Pulmonary/Chest: Effort normal and breath sounds normal. No respiratory distress. She has no wheezes. She has no rales. She exhibits no tenderness.  Abdominal: Soft. Bowel sounds are  normal. She exhibits no distension and no  mass. There is no tenderness. There is no rebound and no guarding.  Genitourinary:  Chaperone present during exam, normal external genitalia, and vagina Large amount of thick white discharge No adnexal or uterine tenderness No cervical motion tenderness  Musculoskeletal: Normal range of motion. She exhibits no edema and no tenderness.  Neurological: She is alert and oriented to person, place, and time.  Skin: Skin is warm and dry.  Psychiatric: She has a normal mood and affect. Her behavior is normal. Judgment and thought content normal.    ED Course  Procedures (including critical care time) Labs Review Labs Reviewed  WET PREP, GENITAL  GC/CHLAMYDIA PROBE AMP   Results for orders placed during the hospital encounter of 03/17/13  WET PREP, GENITAL      Result Value Range   Yeast Wet Prep HPF POC FEW (*) NONE SEEN   Trich, Wet Prep NONE SEEN  NONE SEEN   Clue Cells Wet Prep HPF POC FEW (*) NONE SEEN   WBC, Wet Prep HPF POC MANY (*) NONE SEEN       EKG Interpretation   None       MDM   1. Chronic pain   2. Vaginal discharge     Patient with metastatic cancer. She states that her pain is normally well controlled, but that she is out of her pain medicines. She is requesting a dose of pain medicine here. She does not want refill, but she states that she can followup with her hospice nurse later today. She states that she has had some vaginal discharge, which should like to have evaluated today.  Patient's pain is improved with 2 mg of Dilaudid IM. Wet prep is pending. Anticipate discharge to home with treatment for likely BV.  Will treat with metronidazole and diflucan.  PCP follow-up recommended.  Pain controlled in the ED.    Roxy Horseman, PA-C 03/17/13 1129

## 2013-03-21 ENCOUNTER — Ambulatory Visit (HOSPITAL_COMMUNITY)
Admit: 2013-03-21 | Discharge: 2013-03-21 | Disposition: A | Discharge status: Home / Self Care | Payer: Medicaid Other | Attending: Oncology | Admitting: Oncology

## 2013-03-21 DIAGNOSIS — C7951 Secondary malignant neoplasm of bone: Secondary | ICD-10-CM | POA: Insufficient documentation

## 2013-03-21 DIAGNOSIS — R4182 Altered mental status, unspecified: Secondary | ICD-10-CM | POA: Insufficient documentation

## 2013-03-21 DIAGNOSIS — C50919 Malignant neoplasm of unspecified site of unspecified female breast: Secondary | ICD-10-CM

## 2013-03-21 LAB — POCT I-STAT CREATININE: Creatinine, Ser: 0.8 mg/dL (ref 0.50–1.10)

## 2013-03-21 MED ORDER — GADOBENATE DIMEGLUMINE 529 MG/ML IV SOLN
10.0000 mL | Freq: Once | INTRAVENOUS | Status: AC | PRN
  Administered 2013-03-21: 10 mL via INTRAVENOUS

## 2013-03-22 ENCOUNTER — Other Ambulatory Visit: Admitting: Lab

## 2013-03-22 ENCOUNTER — Ambulatory Visit: Admitting: Oncology

## 2013-03-24 NOTE — Telephone Encounter (Signed)
Pt called requesting an appt. gv appt for 03/29/13 w/ labs @ 10:45am and ov @ 11:15am. Pt is aware...td

## 2013-03-28 ENCOUNTER — Other Ambulatory Visit: Payer: Self-pay | Admitting: *Deleted

## 2013-03-28 DIAGNOSIS — C50919 Malignant neoplasm of unspecified site of unspecified female breast: Secondary | ICD-10-CM

## 2013-03-28 MED ORDER — FENTANYL 50 MCG/HR TD PT72: 2.0000 | MEDICATED_PATCH | TRANSDERMAL | Status: DC

## 2013-03-28 MED ORDER — MORPHINE SULFATE 15 MG PO TABS: ORAL_TABLET | ORAL | Status: DC

## 2013-03-29 ENCOUNTER — Other Ambulatory Visit: Payer: Medicaid Other | Admitting: Lab

## 2013-03-29 ENCOUNTER — Ambulatory Visit: Payer: Medicaid Other | Admitting: Physician Assistant

## 2013-03-29 NOTE — Telephone Encounter (Signed)
Pt called stating that she can not make her appt today due to transportation and wanted to r/s asap. gv appt for 03/31/13 w/ labs@ 11:15am and ov@ 11:45am..the patient is aware...td

## 2013-03-31 ENCOUNTER — Other Ambulatory Visit: Payer: Medicaid Other | Admitting: Lab

## 2013-03-31 ENCOUNTER — Encounter: Payer: Self-pay | Admitting: Physician Assistant

## 2013-03-31 ENCOUNTER — Ambulatory Visit: Payer: Medicaid Other | Admitting: Physician Assistant

## 2013-03-31 NOTE — Progress Notes (Signed)
[facility] today.  Letter mailed to patient.  Zollie Scale, PA-C 03/31/2013

## 2013-04-03 ENCOUNTER — Telehealth: Payer: Self-pay | Admitting: Physician Assistant

## 2013-04-03 NOTE — Telephone Encounter (Signed)
Sent letter to patient from Amy [name] °

## 2013-04-04 ENCOUNTER — Telehealth: Payer: Self-pay | Admitting: *Deleted

## 2013-04-04 ENCOUNTER — Other Ambulatory Visit: Payer: Self-pay | Admitting: *Deleted

## 2013-04-04 DIAGNOSIS — C50919 Malignant neoplasm of unspecified site of unspecified female breast: Secondary | ICD-10-CM

## 2013-04-04 MED ORDER — MORPHINE SULFATE 15 MG PO TABS: ORAL_TABLET | ORAL | Status: DC

## 2013-04-04 MED ORDER — FENTANYL 50 MCG/HR TD PT72: 100.0000 ug | MEDICATED_PATCH | TRANSDERMAL | Status: DC

## 2013-04-04 NOTE — Telephone Encounter (Signed)
Elijah Birk RN with [facility] called stating she was able to visit with pt - with noted concern that pt's niece who was staying with pt has left and taken majority of pt's MSO4 and 2 of pt's fentanyl patches.  Presently [name] has 2 fentanyl patches with most recent change pm last night. She has 5 of the MSO4.  Per discussion Pam and above situation- for pt's best safety with narcotics for her terminal illness is to discuss with pt need to obtain weekly dispense amounts which would mean changing to a pharmacy that can deliver- routine refills can be arranged weekly so pt does not have break in availability.  Otherwise pt was sleeping soundly when Pam arrived which took Pam multiple attempts to get [name] to come to the door. Otherwise she is stable and not as confused as in the past. Cristin states to [facility] that she feels she gets confused when she takes too much of the Mso4- too close together for pain excerbations.  No other needs at this time.  Refills will be obtained for current fill and Pam will follow up with above plan.  Note pt is scheduled for follow up with this office tomorrow and above can be addressed as well.

## 2013-04-04 NOTE — Telephone Encounter (Signed)
This RN spoke with Elijah Birk RN with [facility] per concern with call from pt this am with scheduling - with pt stating some confusion as well as noted conversation with someone in the home that she was frustrated with.  At present [facility] spoke with pt earlier today and is planning to visit this afternoon. Pt states she was unavailable this am for visit from Pam due to " I am going out to vote and run some errands ".  Pam will call if noted concerns with pt's social environment is compromised and or other concerns.

## 2013-04-04 NOTE — Telephone Encounter (Signed)
Pt called this morning thinking she had an appt here today. i informed her that her appt was 03/31/13. Pt admits to continuously to forget her appts. She requested an other appt. gv appt for 04/05/13 w/ labs@ 12:45pm and ov@ 1:15pm. Pt is aware....td

## 2013-04-05 ENCOUNTER — Other Ambulatory Visit: Payer: Medicaid Other | Admitting: Lab

## 2013-04-05 ENCOUNTER — Ambulatory Visit: Payer: Medicaid Other | Admitting: Physician Assistant

## 2013-04-05 ENCOUNTER — Other Ambulatory Visit: Payer: Self-pay | Admitting: *Deleted

## 2013-04-05 DIAGNOSIS — C50919 Malignant neoplasm of unspecified site of unspecified female breast: Secondary | ICD-10-CM

## 2013-04-05 MED ORDER — FENTANYL 50 MCG/HR TD PT72: 100.0000 ug | MEDICATED_PATCH | TRANSDERMAL | Status: DC

## 2013-04-05 MED ORDER — MORPHINE SULFATE 15 MG PO TABS: ORAL_TABLET | ORAL | Status: DC

## 2013-04-18 ENCOUNTER — Other Ambulatory Visit: Payer: Self-pay | Admitting: *Deleted

## 2013-04-18 DIAGNOSIS — C50919 Malignant neoplasm of unspecified site of unspecified female breast: Secondary | ICD-10-CM

## 2013-04-18 MED ORDER — SENNOSIDES-DOCUSATE SODIUM 8.6-50 MG PO TABS: ORAL_TABLET | ORAL | Status: DC

## 2013-04-18 MED ORDER — DEXAMETHASONE 4 MG PO TABS: ORAL_TABLET | ORAL | Status: DC

## 2013-04-18 MED ORDER — ZOLPIDEM TARTRATE 5 MG PO TABS: 5.0000 mg | ORAL_TABLET | Freq: Every evening | ORAL | Status: DC | PRN

## 2013-04-21 ENCOUNTER — Other Ambulatory Visit: Payer: Self-pay | Admitting: *Deleted

## 2013-04-21 DIAGNOSIS — C50919 Malignant neoplasm of unspecified site of unspecified female breast: Secondary | ICD-10-CM

## 2013-04-21 DIAGNOSIS — C7951 Secondary malignant neoplasm of bone: Secondary | ICD-10-CM

## 2013-04-21 MED ORDER — MORPHINE SULFATE 15 MG PO TABS: ORAL_TABLET | ORAL | Status: DC

## 2013-04-21 MED ORDER — LORAZEPAM 1 MG PO TABS: 1.0000 mg | ORAL_TABLET | Freq: Four times a day (QID) | ORAL | Status: DC | PRN

## 2013-04-21 MED ORDER — FENTANYL 100 MCG/HR TD PT72: 100.0000 ug | MEDICATED_PATCH | TRANSDERMAL | Status: DC

## 2013-04-21 NOTE — Telephone Encounter (Signed)
This RN received call from [facility] RN with [facility] stating needs for pt.  Pt started methadone and states several side effects including increased nausea with vomiting and increase in breakthrough pain- therefore pt has had to take more MSO4 ( up to 7 tablets a day ) and is in need of refill.  Per discussion with Pam- plan at present will be to keep fentanly at 100mg  dose - but Pam is requesting patch vs using 2 of the ( cost concern ).  Pt also states refill needed for ativan.  Above prescriptions obtained and Pam will follow up with pt.

## 2013-05-04 ENCOUNTER — Telehealth: Payer: Self-pay | Admitting: *Deleted

## 2013-05-04 ENCOUNTER — Other Ambulatory Visit: Payer: Self-pay | Admitting: *Deleted

## 2013-05-04 DIAGNOSIS — C7951 Secondary malignant neoplasm of bone: Secondary | ICD-10-CM

## 2013-05-04 DIAGNOSIS — C50919 Malignant neoplasm of unspecified site of unspecified female breast: Secondary | ICD-10-CM

## 2013-05-04 MED ORDER — LORAZEPAM 1 MG PO TABS: 1.0000 mg | ORAL_TABLET | Freq: Four times a day (QID) | ORAL | Status: DC | PRN

## 2013-05-04 MED ORDER — MORPHINE SULFATE 15 MG PO TABS: ORAL_TABLET | ORAL | Status: DC

## 2013-05-04 MED ORDER — FENTANYL 100 MCG/HR TD PT72: 100.0000 ug | MEDICATED_PATCH | TRANSDERMAL | Status: DC

## 2013-05-04 MED ORDER — CYCLOBENZAPRINE HCL 10 MG PO TABS: 10.0000 mg | ORAL_TABLET | Freq: Three times a day (TID) | ORAL | Status: DC | PRN

## 2013-05-04 NOTE — Telephone Encounter (Signed)
This RN was contacted by Hospice RN, [name], stating situation with patient per visit today. [name] states she is out of all her medications due to leaving her bag with medications in it in her father's trunk. Her father came down over the holidays from [place]. By the time she realized she did not have her bag her father was too far to return and stated he would mail her purse to her.  [name] states there are some incongruencies in [name]'s timeline,but pt has not been able to give good history with other situations either.  [name] does not see medications in the home.  Plan per her discussion with Hospice is to obtain a 7 day emergency fill of medications and follow up next week with location of purse.  Note 4 medications are due to for refill regardless and these meds will be dispensed as usual : Morphine, fentanyl, cyclobenzaprine and ativan.  Scripts obtained for above medications and request faxed for 1 week supply on other meds sent to [facility] pharmacy.

## 2013-05-04 NOTE — Telephone Encounter (Signed)
Returned patient's call to let her know Rx will be ready before 5pm. Pt verbalized understanding. Had no further concerns.

## 2013-05-11 ENCOUNTER — Other Ambulatory Visit: Payer: Self-pay | Admitting: *Deleted

## 2013-05-11 MED ORDER — MORPHINE SULFATE 15 MG PO TABS: ORAL_TABLET | ORAL | Status: DC

## 2013-05-16 ENCOUNTER — Other Ambulatory Visit: Payer: Self-pay | Admitting: *Deleted

## 2013-05-16 MED ORDER — FENTANYL 100 MCG/HR TD PT72: 100.0000 ug | MEDICATED_PATCH | TRANSDERMAL | Status: DC

## 2013-05-16 NOTE — Telephone Encounter (Signed)
[name] calling in for patient to refill Fentanyl patches, dispensing one box every 2 weeks at this point so patient can keep up with smaller amounts in shorter time spam, less meds to loose. Patient did recover some of her meds from her dad when he left town and pocketbook was in his car headed to [place].

## 2013-05-19 ENCOUNTER — Other Ambulatory Visit: Payer: Self-pay | Admitting: *Deleted

## 2013-05-19 ENCOUNTER — Telehealth: Payer: Self-pay | Admitting: *Deleted

## 2013-05-19 DIAGNOSIS — C50919 Malignant neoplasm of unspecified site of unspecified female breast: Secondary | ICD-10-CM

## 2013-05-19 MED ORDER — MORPHINE SULFATE 15 MG PO TABS: ORAL_TABLET | ORAL | Status: DC

## 2013-05-19 MED ORDER — DEXAMETHASONE 4 MG PO TABS: ORAL_TABLET | ORAL | Status: DC

## 2013-05-19 NOTE — Telephone Encounter (Signed)
Hospice nurse Okey Regal calling for refills on patient for MSIR and Decadron.

## 2013-05-19 NOTE — Telephone Encounter (Signed)
gv appt for 05/30/13 @ 3:15pm...td

## 2013-05-22 ENCOUNTER — Other Ambulatory Visit: Payer: Self-pay | Admitting: *Deleted

## 2013-05-22 MED ORDER — FENTANYL 100 MCG/HR TD PT72: 100.0000 ug | MEDICATED_PATCH | TRANSDERMAL | Status: DC

## 2013-05-29 ENCOUNTER — Other Ambulatory Visit: Payer: Self-pay | Admitting: *Deleted

## 2013-05-29 ENCOUNTER — Telehealth: Payer: Self-pay | Admitting: *Deleted

## 2013-05-29 MED ORDER — FENTANYL 100 MCG/HR TD PT72: 100.0000 ug | MEDICATED_PATCH | TRANSDERMAL | Status: DC

## 2013-05-29 NOTE — Telephone Encounter (Signed)
Noted appointment on 12/30 with mid level on day MD not in office.  This RN reschedule appointment for day pt can see MD due to extensive care issues.  Attempted to call pt at cell number with automated answer stating pt is not taking calls and mailbox is full.  This RN called pt's hospice RN, Okey Regal, at [phone] and left detailed message per above appointment.

## 2013-05-30 ENCOUNTER — Ambulatory Visit: Payer: Medicaid Other | Admitting: Physician Assistant

## 2013-05-31 ENCOUNTER — Ambulatory Visit: Payer: Medicaid Other | Admitting: Oncology

## 2013-06-05 ENCOUNTER — Other Ambulatory Visit: Payer: Medicaid Other

## 2013-06-05 ENCOUNTER — Encounter: Payer: Self-pay | Admitting: Oncology

## 2013-06-05 ENCOUNTER — Ambulatory Visit: Payer: Medicaid Other | Admitting: Oncology

## 2013-06-05 NOTE — Patient Instructions (Signed)
Smoking Cessation Quitting smoking is important to your health and has many advantages. However, it is not always easy to quit since nicotine is a very addictive drug. Often times, people try 3 times or more before being able to quit. This document explains the best ways for you to prepare to quit smoking. Quitting takes hard work and a lot of effort, but you can do it. ADVANTAGES OF QUITTING SMOKING  You will live longer, feel better, and live better.  Your body will feel the impact of quitting smoking almost immediately.  Within 20 minutes, blood pressure decreases. Your pulse returns to its normal level.  After 8 hours, carbon monoxide levels in the blood return to normal. Your oxygen level increases.  After 24 hours, the chance of having a heart attack starts to decrease. Your breath, hair, and body stop smelling like smoke.  After 48 hours, damaged nerve endings begin to recover. Your sense of taste and smell improve.  After 72 hours, the body is virtually free of nicotine. Your bronchial tubes relax and breathing becomes easier.  After 2 to 12 weeks, lungs can hold more air. Exercise becomes easier and circulation improves.  The risk of having a heart attack, stroke, cancer, or lung disease is greatly reduced.  After 1 year, the risk of coronary heart disease is cut in half.  After 5 years, the risk of stroke falls to the same as a nonsmoker.  After 10 years, the risk of lung cancer is cut in half and the risk of other cancers decreases significantly.  After 15 years, the risk of coronary heart disease drops, usually to the level of a nonsmoker.  If you are pregnant, quitting smoking will improve your chances of having a healthy baby.  The people you live with, especially any children, will be healthier.  You will have extra money to spend on things other than cigarettes. QUESTIONS TO THINK ABOUT BEFORE ATTEMPTING TO QUIT You may want to talk about your answers with your  caregiver.  Why do you want to quit?  If you tried to quit in the past, what helped and what did not?  What will be the most difficult situations for you after you quit? How will you plan to handle them?  Who can help you through the tough times? Your family? Friends? A caregiver?  What pleasures do you get from smoking? What ways can you still get pleasure if you quit? Here are some questions to ask your caregiver:  How can you help me to be successful at quitting?  What medicine do you think would be best for me and how should I take it?  What should I do if I need more help?  What is smoking withdrawal like? How can I get information on withdrawal? GET READY  Set a quit date.  Change your environment by getting rid of all cigarettes, ashtrays, matches, and lighters in your home, car, or work. Do not let people smoke in your home.  Review your past attempts to quit. Think about what worked and what did not. GET SUPPORT AND ENCOURAGEMENT You have a better chance of being successful if you have help. You can get support in many ways.  Tell your family, friends, and co-workers that you are going to quit and need their support. Ask them not to smoke around you.  Get individual, group, or telephone counseling and support. Programs are available at local hospitals and health centers. Call your local health department for   information about programs in your area.  Spiritual beliefs and practices may help some smokers quit.  Download a "quit meter" on your computer to keep track of quit statistics, such as how long you have gone without smoking, cigarettes not smoked, and money saved.  Get a self-help book about quitting smoking and staying off of tobacco. LEARN NEW SKILLS AND BEHAVIORS  Distract yourself from urges to smoke. Talk to someone, go for a walk, or occupy your time with a task.  Change your normal routine. Take a different route to work. Drink tea instead of coffee.  Eat breakfast in a different place.  Reduce your stress. Take a hot bath, exercise, or read a book.  Plan something enjoyable to do every day. Reward yourself for not smoking.  Explore interactive web-based programs that specialize in helping you quit. GET MEDICINE AND USE IT CORRECTLY Medicines can help you stop smoking and decrease the urge to smoke. Combining medicine with the above behavioral methods and support can greatly increase your chances of successfully quitting smoking.  Nicotine replacement therapy helps deliver nicotine to your body without the negative effects and risks of smoking. Nicotine replacement therapy includes nicotine gum, lozenges, inhalers, nasal sprays, and skin patches. Some may be available over-the-counter and others require a prescription.  Antidepressant medicine helps people abstain from smoking, but how this works is unknown. This medicine is available by prescription.  Nicotinic receptor partial agonist medicine simulates the effect of nicotine in your brain. This medicine is available by prescription. Ask your caregiver for advice about which medicines to use and how to use them based on your health history. Your caregiver will tell you what side effects to look out for if you choose to be on a medicine or therapy. Carefully read the information on the package. Do not use any other product containing nicotine while using a nicotine replacement product.  RELAPSE OR DIFFICULT SITUATIONS Most relapses occur within the first 3 months after quitting. Do not be discouraged if you start smoking again. Remember, most people try several times before finally quitting. You may have symptoms of withdrawal because your body is used to nicotine. You may crave cigarettes, be irritable, feel very hungry, cough often, get headaches, or have difficulty concentrating. The withdrawal symptoms are only temporary. They are strongest when you first quit, but they will go away within  10 14 days. To reduce the chances of relapse, try to:  Avoid drinking alcohol. Drinking lowers your chances of successfully quitting.  Reduce the amount of caffeine you consume. Once you quit smoking, the amount of caffeine in your body increases and can give you symptoms, such as a rapid heartbeat, sweating, and anxiety.  Avoid smokers because they can make you want to smoke.  Do not let weight gain distract you. Many smokers will gain weight when they quit, usually less than 10 pounds. Eat a healthy diet and stay active. You can always lose the weight gained after you quit.  Find ways to improve your mood other than smoking. FOR MORE INFORMATION  www.smokefree.gov  Document Released: 05/12/2001 Document Revised: 11/17/2011 Document Reviewed: 08/27/2011 [employer] Patient Information 2014 [employer].  

## 2013-06-07 NOTE — Progress Notes (Signed)
No show

## 2013-06-08 ENCOUNTER — Other Ambulatory Visit: Payer: Self-pay | Admitting: *Deleted

## 2013-06-08 MED ORDER — FENTANYL 100 MCG/HR TD PT72: 100.0000 ug | MEDICATED_PATCH | TRANSDERMAL | Status: DC

## 2013-06-08 MED ORDER — MORPHINE SULFATE 15 MG PO TABS: 15.0000 mg | ORAL_TABLET | Freq: Four times a day (QID) | ORAL | Status: DC | PRN

## 2013-06-08 NOTE — Telephone Encounter (Signed)
Call received from Meredith Leeds RN stating need for refill on fentanyl as well as per visit yesterday,pt cannot locate her bottle of Mso4 tablets and is requesting a refill in lesser quantity.  This RN discussed above with [name], stating concern due to ongoing medications being reported as missing or lost with this patient.  Above is concerning due to possible diversion of medication that is nonbenficial for patient.  Plan per discussion is MSO4 will be written for a weekly amount. Pt needs to obtain a locked box that she keeps in her possession. If medication is lost refill will not be given until appropriate time.  [name] will discuss the above with pt per MD concerns.

## 2013-06-16 ENCOUNTER — Emergency Department (HOSPITAL_COMMUNITY): Payer: Medicaid Other

## 2013-06-16 ENCOUNTER — Emergency Department (HOSPITAL_COMMUNITY)
Admission: EM | Admit: 2013-06-16 | Discharge: 2013-06-16 | Disposition: A | Discharge status: Home / Self Care | Payer: Medicaid Other | Attending: Emergency Medicine | Admitting: Emergency Medicine

## 2013-06-16 ENCOUNTER — Encounter (HOSPITAL_COMMUNITY): Payer: Self-pay | Admitting: Emergency Medicine

## 2013-06-16 DIAGNOSIS — R5381 Other malaise: Secondary | ICD-10-CM | POA: Insufficient documentation

## 2013-06-16 DIAGNOSIS — R142 Eructation: Secondary | ICD-10-CM | POA: Insufficient documentation

## 2013-06-16 DIAGNOSIS — C7931 Secondary malignant neoplasm of brain: Secondary | ICD-10-CM | POA: Insufficient documentation

## 2013-06-16 DIAGNOSIS — K219 Gastro-esophageal reflux disease without esophagitis: Secondary | ICD-10-CM | POA: Insufficient documentation

## 2013-06-16 DIAGNOSIS — C7952 Secondary malignant neoplasm of bone marrow: Secondary | ICD-10-CM

## 2013-06-16 DIAGNOSIS — Z87440 Personal history of urinary (tract) infections: Secondary | ICD-10-CM | POA: Insufficient documentation

## 2013-06-16 DIAGNOSIS — Z79899 Other long term (current) drug therapy: Secondary | ICD-10-CM | POA: Insufficient documentation

## 2013-06-16 DIAGNOSIS — IMO0002 Reserved for concepts with insufficient information to code with codable children: Secondary | ICD-10-CM | POA: Insufficient documentation

## 2013-06-16 DIAGNOSIS — I10 Essential (primary) hypertension: Secondary | ICD-10-CM | POA: Insufficient documentation

## 2013-06-16 DIAGNOSIS — R141 Gas pain: Secondary | ICD-10-CM | POA: Insufficient documentation

## 2013-06-16 DIAGNOSIS — R748 Abnormal levels of other serum enzymes: Secondary | ICD-10-CM

## 2013-06-16 DIAGNOSIS — R000 Tachycardia, unspecified: Secondary | ICD-10-CM | POA: Insufficient documentation

## 2013-06-16 DIAGNOSIS — C7951 Secondary malignant neoplasm of bone: Secondary | ICD-10-CM | POA: Insufficient documentation

## 2013-06-16 DIAGNOSIS — Z87891 Personal history of nicotine dependence: Secondary | ICD-10-CM | POA: Insufficient documentation

## 2013-06-16 DIAGNOSIS — C787 Secondary malignant neoplasm of liver and intrahepatic bile duct: Secondary | ICD-10-CM | POA: Insufficient documentation

## 2013-06-16 DIAGNOSIS — C7949 Secondary malignant neoplasm of other parts of nervous system: Secondary | ICD-10-CM

## 2013-06-16 DIAGNOSIS — R143 Flatulence: Secondary | ICD-10-CM

## 2013-06-16 DIAGNOSIS — E43 Unspecified severe protein-calorie malnutrition: Secondary | ICD-10-CM | POA: Insufficient documentation

## 2013-06-16 DIAGNOSIS — R4182 Altered mental status, unspecified: Secondary | ICD-10-CM | POA: Insufficient documentation

## 2013-06-16 DIAGNOSIS — C50919 Malignant neoplasm of unspecified site of unspecified female breast: Secondary | ICD-10-CM | POA: Insufficient documentation

## 2013-06-16 DIAGNOSIS — R5383 Other fatigue: Secondary | ICD-10-CM

## 2013-06-16 LAB — CBC
HEMATOCRIT: 26.8 % — AB (ref 36.0–46.0)
HEMOGLOBIN: 8.7 g/dL — AB (ref 12.0–15.0)
MCH: 28.8 pg (ref 26.0–34.0)
MCHC: 32.5 g/dL (ref 30.0–36.0)
MCV: 88.7 fL (ref 78.0–100.0)
Platelets: 303 10*3/uL (ref 150–400)
RBC: 3.02 MIL/uL — ABNORMAL LOW (ref 3.87–5.11)
RDW: 17.6 % — ABNORMAL HIGH (ref 11.5–15.5)
WBC: 6.5 10*3/uL (ref 4.0–10.5)

## 2013-06-16 LAB — COMPREHENSIVE METABOLIC PANEL
ALK PHOS: 1356 U/L — AB (ref 39–117)
ALT: 85 U/L — AB (ref 0–35)
AST: 193 U/L — ABNORMAL HIGH (ref 0–37)
Albumin: 2.7 g/dL — ABNORMAL LOW (ref 3.5–5.2)
BUN: 12 mg/dL (ref 6–23)
CALCIUM: 10.5 mg/dL (ref 8.4–10.5)
CO2: 25 meq/L (ref 19–32)
Chloride: 95 mEq/L — ABNORMAL LOW (ref 96–112)
Creatinine, Ser: 0.67 mg/dL (ref 0.50–1.10)
GFR calc Af Amer: >90 mL/min (ref >90)
Glucose, Bld: 83 mg/dL (ref 70–99)
POTASSIUM: 4.1 meq/L (ref 3.7–5.3)
SODIUM: 136 meq/L — AB (ref 137–147)
Total Bilirubin: 1.2 mg/dL (ref 0.3–1.2)
Total Protein: 6.8 g/dL (ref 6.0–8.3)

## 2013-06-16 LAB — AMMONIA: Ammonia: 43 umol/L (ref 11–60)

## 2013-06-16 LAB — GLUCOSE, CAPILLARY: Glucose-Capillary: 58 mg/dL — ABNORMAL LOW (ref 70–99)

## 2013-06-16 MED ORDER — SODIUM CHLORIDE 0.9 % IV BOLUS (SEPSIS)
1000.0000 mL | Freq: Once | INTRAVENOUS | Status: AC
  Administered 2013-06-16: 1000 mL via INTRAVENOUS

## 2013-06-16 MED ORDER — NALOXONE HCL 0.4 MG/ML IJ SOLN: 0.4000 mg | Freq: Once | INTRAMUSCULAR | Status: DC

## 2013-06-16 MED ORDER — DEXTROSE 50 % IV SOLN
25.0000 mL | Freq: Once | INTRAVENOUS | Status: AC
  Administered 2013-06-16: 25 mL via INTRAVENOUS
  Filled 2013-06-16: qty 50

## 2013-06-16 NOTE — ED Notes (Addendum)
Upon insertion of urethral catheter patient became alert and adamantly and violently refused advancement of catheter. Patient became oriented to self and situation and refused insertion of catheter. Patient placed on bedpan and told to urinate. PA made aware.

## 2013-06-16 NOTE — ED Notes (Signed)
Bed: QL73 Expected date:  Expected time:  Means of arrival:  Comments: ems- hospice pt, possible substance abuse

## 2013-06-16 NOTE — Progress Notes (Addendum)
[place] [facility]) RN Visit M. Milana Obey  Currently followed by [facility]; QD:IYMEBRAXEN Breast Cancer; Code status is DNR; GOLD OOF DNR form with paperwork; pt sent for evaluation by [facility] for AMS, increased lethargy, jaundice Pt seen by writer in [place] [address] lying on stretcher; at this visit, pt has no response to voice; responding to painful stimuli only then quickly becomes somulent; no family at bedside; Spoke with Jarrett Soho Muthersbaugh PA-C, work-up in process and at this time, feels pt very likely to be admitted  If admitted, [facility] will follow; Pt's home medication list and transfer summary is with ED chart/paperwork Please call [facility] @ [phone]- ask for on-call RN with any hospice needs.   Thank you ,Danton Sewer, [facility]

## 2013-06-16 NOTE — ED Provider Notes (Signed)
Medical screening examination/treatment/procedure(s) were conducted as a shared visit with non-physician practitioner(s) and myself.  I personally evaluated the patient during the encounter.  EKG Interpretation   None      47yF with AMS. Improved back to baseline by the time I evaluated her. Confirmed by daughter at bedside. Suspect over medicated. Pt would like to go home. I think this is reasonable. She has no further complaints. My suspicion for more sinister process is low.   Virgel Manifold, MD 06/16/13 2203

## 2013-06-16 NOTE — Discharge Instructions (Signed)
1. Medications: usual home medications 2. Treatment: rest, drink plenty of fluids,  3. Follow Up: Please followup with your primary doctor for discussion of your diagnoses and Dr. Jana Hakim

## 2013-06-16 NOTE — ED Notes (Signed)
Patient removed from bedpan, no urine at this time. Patient continues to refuse catheterization. Patient informed to let staff know when she feels able to urinate.

## 2013-06-16 NOTE — ED Provider Notes (Signed)
CSN: [id number]     Arrival date & time 06/16/13  1431 History   First MD Initiated Contact with Patient 06/16/13 1504     Chief Complaint  Patient presents with  . Altered Mental Status   (Consider location/radiation/quality/duration/timing/severity/associated sxs/prior Treatment) Patient is a 48 y.o. female presenting with altered mental status. The history is provided by the patient and medical records. No language interpreter was used.  Altered Mental Status   [name] is a 48 y.o. female  with a hx of metastatic breast cancer, polysubstance abuse presents to the Emergency Department complaining of gradual, persistent, progressively worsening AMS of unknown onset. Pt is arousable to voice, but answers no questions. Per EMS, pt with AMS from baseline according to the hospice nurse with associated increased weakness and lethargy.  EMS also reports a hx of CA, substance abuse and reports finding a crack pipe in her purse.  Pt is a DNR.  Level 5 caveat for AMS.    Past Medical History  Diagnosis Date  . Hypertension   . GERD (gastroesophageal reflux disease)   . Pyelonephritis 10/01/11  . Recurrent UTI (urinary tract infection) 10/01/11  . S/P chemotherapy, time since 4-12 weeks   . S/P radiation > 12 weeks 03/15/12- 04/07/12    right hip, clivus  . Hypercalcemia 06/21/2012  . History of radiation therapy 08/06/10 -09/16/10    R supraclav fossa, R chest wall, R drain site  . radiaiton therapy 07/22/12 -08/15/12    T4-T7  . 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 Brain cancer     spread from breast ca   Past Surgical History  Procedure Laterality Date  . Cesarean section  1989; 2010  . Mastectomy  2011    right  . Back surgery  2012    "removed tumor,cancer, from my spine  . Port-a-cath removal  2012    right chest  . Portacath placement  2011    right  . Tubal ligation  2010  . Breast biopsy   2011    right  . Hip arthroplasty  05/04/2012    Procedure: ARTHROPLASTY BIPOLAR HIP;  Surgeon: Mauri Pole, MD;  Location: [facility];  Service: Orthopedics;  Laterality: Right;  RIGHT HIP HEMI ARTHROPLASTY    Family History  Problem Relation Age of Onset  . Cancer Brother 69    died of colon cancer at age of 1  . High blood pressure Mother    History  Substance Use Topics  . Smoking status: Former Smoker -- 0.25 packs/day for 31 years    Types: Cigarettes    Quit date: 04/26/2012  . Smokeless tobacco: Never Used  . Alcohol Use: No   OB History   Grav Para Term Preterm Abortions TAB SAB Ect Mult Living                 Review of Systems  Unable to perform ROS: Mental status change    Allergies  Review of patient's allergies indicates no known allergies.  Home Medications   Current Outpatient Rx  Name  Route  Sig  Dispense  Refill  . cyclobenzaprine (FLEXERIL) 10 MG tablet   Oral   Take 1 tablet (10 mg total) by mouth 3 (three) times daily as needed for muscle spasms.   60 tablet   2   . dexamethasone (DECADRON) 4 MG tablet  TAKE TWO TABLETS BY MOUTH TWO TIMES DAILY WITH A MEAL.   120 tablet   0     Hospice Patient   . exemestane (AROMASIN) 25 MG tablet   Oral   Take 1 tablet (25 mg total) by mouth daily after breakfast.   30 tablet   3     [facility]   . fentaNYL (DURAGESIC - DOSED MCG/HR) 100 MCG/HR   Transdermal   Place 1 patch (100 mcg total) onto the skin every 3 (three) days.   5 patch   0     HOSPICE PATIENT//2 weeks supply   . fluconazole (DIFLUCAN) 150 MG tablet   Oral   Take 1 tablet (150 mg total) by mouth once.   1 tablet   0   . LORazepam (ATIVAN) 1 MG tablet   Oral   Take 1 tablet (1 mg total) by mouth every 6 (six) hours as needed for anxiety.   30 tablet   0     SPOKE TO PHARMACIST.   [name] morphine (MSIR) 15 MG tablet   Oral   Take 1 tablet (15 mg total) by mouth every 6 (six) hours as needed for severe  pain. May take 1-2 tabs po q 4 hours as needed   30 tablet   0     HOSPICE PATIENT Fax to [facility]   . omeprazole (PRILOSEC) 40 MG capsule   Oral   Take 1 capsule (40 mg total) by mouth daily.   30 capsule   3     [facility]   . ondansetron (ZOFRAN ODT) 4 MG disintegrating tablet      4mg  ODT q4 hours prn nausea/vomit   12 tablet   0   . polyethylene glycol (MIRALAX / GLYCOLAX) packet   Oral   Take 17 g by mouth daily.   14 each   2   . promethazine (PHENERGAN) 25 MG tablet   Oral   Take 1 tablet (25 mg total) by mouth every 6 (six) hours as needed for nausea. [facility]   30 tablet   1   . senna-docusate (SENNA S) 8.6-50 MG per tablet      TAKE TWO TO FOUR TABLETS BY MOUTH TWO TIMES A DAY.   60 tablet   1   . triamterene-hydrochlorothiazide (DYAZIDE) 37.5-25 MG per capsule   Oral   Take 1 each (1 capsule total) by mouth every morning.   30 capsule   12   . zolpidem (AMBIEN) 5 MG tablet   Oral   Take 1 tablet (5 mg total) by mouth at bedtime as needed for sleep.   30 tablet   0     CALLED TO PHARMACIST.    BP 120/70  Pulse 101  Temp(Src) 98.2 F (36.8 C) (Rectal)  Resp 18  SpO2 98% Physical Exam  Nursing note and vitals reviewed. Constitutional: She appears well-developed and well-nourished. No distress.  HENT:  Head: Normocephalic and atraumatic.  Mouth/Throat: Oropharynx is clear and moist. No oropharyngeal exudate.  Eyes: Conjunctivae are normal. No scleral icterus.  Pupils are equal, round and slow but reactive No pinpoint pupils on exam;  Neck: Normal range of motion. Neck supple.  Cardiovascular: Regular rhythm, normal heart sounds and intact distal pulses.   No murmur heard. Tachycardia  Pulmonary/Chest: Effort normal and breath sounds normal. No respiratory distress. She has no wheezes.  Shallow breaths Clear and equal breath sounds SPO2 99% on  RA  Abdominal: Soft. Bowel sounds are normal.  She exhibits distension. She exhibits no mass. There is no tenderness. There is no rebound and no guarding.  Palpable hepatomegaly, belly is otherwise soft  Musculoskeletal: Normal range of motion. She exhibits no edema.  Neurological: GCS eye subscore is 3. GCS verbal subscore is 1. GCS motor subscore is 5.  GCS 9 - pt gives no verbal response at all  Skin: Skin is warm and dry. She is not diaphoretic. No erythema.  Psychiatric: She has a normal mood and affect.    ED Course  Procedures (including critical care time) Labs Review Labs Reviewed  CBC - Abnormal; Notable for the following:    RBC 3.02 (*)    Hemoglobin 8.7 (*)    HCT 26.8 (*)    RDW 17.6 (*)    All other components within normal limits  COMPREHENSIVE METABOLIC PANEL - Abnormal; Notable for the following:    Sodium 136 (*)    Chloride 95 (*)    Albumin 2.7 (*)    AST 193 (*)    ALT 85 (*)    Alkaline Phosphatase 1356 (*)    All other components within normal limits  GLUCOSE, CAPILLARY - Abnormal; Notable for the following:    Glucose-Capillary 58 (*)    All other components within normal limits  AMMONIA  URINALYSIS, ROUTINE W REFLEX MICROSCOPIC  URINE RAPID DRUG SCREEN (HOSP PERFORMED)   Imaging Review Ct Head Wo Contrast  06/16/2013   CLINICAL DATA:  Altered mental status. History of metastatic breast cancer.  EXAM: CT HEAD WITHOUT CONTRAST  TECHNIQUE: Contiguous axial images were obtained from the base of the skull through the vertex without intravenous contrast.  COMPARISON:  Brain MRI 12/08/2012 and head CT scan 06/15/2012.  FINDINGS: The metastatic deposit in the clivus seen on MRI is not visible on CT. No metastasis is identified. The brain appears normal without infarct, hemorrhage, mass lesion, mass effect, midline shift or abnormal extra-axial fluid collection there is no hydrocephalus or pneumocephalus.  IMPRESSION: No acute abnormality is identified. Metastatic deposit in the clivus seen on brain MRI is  visible on this study. No evidence of metastatic disease is seen. If there is continued concern for metastatic disease, MRI of the brain with and without contrast is recommended.   Electronically Signed   By: Inge Rise M.D.   On: 06/16/2013 16:55    EKG Interpretation   None       MDM   1. Altered mental status   2. Alkaline phosphatase elevation   3. Protein-calorie malnutrition, severe   4. Tachycardia   5. 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, brain and bone      [name] presents with substance abuse Hx, metastatic breast cancer (followed by hospice) with lethargy and increased jaundice today.  Level 5 caveat for AMS.     4:11 PM Discussion with [name], hospice RN who saw her this AM with Dr. Karie Georges.  Pt lives alone without support in the home and was found to be very lethargic during the assessment. She has been off her steroid for 1 week by her own choice; hx of brain mets.  Increased jaundice per RN.  ? Drug abuse crack vs morphine abuse this morning as there were crack pipes around and she was unable to produce her Rx of morphine tablets.  Pt also uses fentanyl patches.  Pt only given 1 week supply of narcotics at a time due to abuse hx.  Pt with baseline tachycardia.     6:13 PM Pt some more alert now and denies drug use.  She is much more alert with noxious stimuli.  Mild Asterixis noted, but she is largely uncooperative with the exam and unwilling to answer questions.  Pt still very sleepy.  CBG 58, will give 25g of D50 as she is still too sleepy to eat.    CBC, ammonia, WNL.  Pt with elevated alkaline phosphatase however she has a history of metastasis to her liver.  Head CT without evidence of intracranial hemorrhage.  Pt is afebrile on rectal exam.    6:35 PM Patient daughter now at bedside. Patient's daughter reports that patient is always a sleepy as she was this morning. She reports that her mother took her morning medications which included pain  control, muscle relaxers and anxiety medication which always makes her sleep. Patient is more alert at this time and able to answer questions.  7:03 PM Discussed with hospice who will provide 24 hour follow-up at home.  [place] 15 at discharge.  Patient alert and oriented, nontoxic nonseptic appearing.  It has been determined that no acute conditions requiring further emergency intervention are present at this time. The patient/guardian have been advised of the diagnosis and plan. We have discussed signs and symptoms that warrant return to the ED, such as changes or worsening in symptoms.   Vital signs are stable at discharge.   BP 120/70  Pulse 101  Temp(Src) 98.2 F (36.8 C) (Rectal)  Resp 18  SpO2 98%  Patient/guardian has voiced understanding and agreed to follow-up with the PCP or specialist.   The patient was discussed with and seen by Dr. Wilson Singer who agrees with the treatment plan.    BP 120/70  Pulse 101  Temp(Src) 98.2 F (36.8 C) (Rectal)  Resp 18  SpO2 98%   Abigail Butts, PA-C 06/16/13 [place], PA-C 06/16/13 [place], PA-C 06/16/13 1912

## 2013-06-16 NOTE — ED Notes (Addendum)
Per EMS, altered mental status per hospice nurse, extensive h/o CA, substance abuse, crack pipe found in purse, states she smoked crack earlier this morning, hospice nurse states pt more lethargic and weak than normal, VSS, pt is DNR  Pt opens eyes when name called, but falls back to sleep before can answer questions, unable to get pt to answer

## 2013-06-22 ENCOUNTER — Other Ambulatory Visit: Payer: Self-pay | Admitting: *Deleted

## 2013-06-22 MED ORDER — MORPHINE SULFATE 15 MG PO TABS: 15.0000 mg | ORAL_TABLET | Freq: Four times a day (QID) | ORAL | Status: DC | PRN

## 2013-06-22 MED ORDER — FENTANYL 100 MCG/HR TD PT72: 100.0000 ug | MEDICATED_PATCH | TRANSDERMAL | Status: DC

## 2013-06-22 NOTE — Telephone Encounter (Signed)
[name] RN with [facility] called stating pt is in need of refills on pain medication.  Note per ED visit last week, pt has not been available for follow up visit for [facility]. She called them this am due to being out of pain medications and they have scheduled pt for visit tomorrow at [place] states the social worker is going to visit as well.  [name] states pt also informed her " I don't know where any of my medications are " - [name] states [facility] will follow up this concern per visit tomorrow.  This RN discussed with [name] noted [place] with MD by pt and need for appointment for discussion of care and plan as well as understanding relating to medications that are dispensed by this office and ongoing need for refills due to " missing or lost ".  This RN informed [name] appointment will be made and [facility] contacted so they can arrange with pt a representative from their office- for best communication and understanding.  [name] agreed with above plan.

## 2013-06-23 ENCOUNTER — Other Ambulatory Visit: Payer: Self-pay | Admitting: *Deleted

## 2013-06-23 DIAGNOSIS — C50919 Malignant neoplasm of unspecified site of unspecified female breast: Secondary | ICD-10-CM

## 2013-06-23 MED ORDER — FLUCONAZOLE 100 MG PO TABS: 100.0000 mg | ORAL_TABLET | Freq: Every day | ORAL | Status: DC

## 2013-06-25 ENCOUNTER — Emergency Department (HOSPITAL_COMMUNITY): Payer: Medicaid Other

## 2013-06-25 ENCOUNTER — Encounter (HOSPITAL_COMMUNITY): Payer: Self-pay | Admitting: Emergency Medicine

## 2013-06-25 ENCOUNTER — Emergency Department (HOSPITAL_COMMUNITY)
Admission: EM | Admit: 2013-06-25 | Discharge: 2013-06-25 | Disposition: A | Discharge status: Home / Self Care | Payer: Medicaid Other | Attending: Emergency Medicine | Admitting: Emergency Medicine

## 2013-06-25 DIAGNOSIS — Z923 Personal history of irradiation: Secondary | ICD-10-CM | POA: Insufficient documentation

## 2013-06-25 DIAGNOSIS — R142 Eructation: Secondary | ICD-10-CM | POA: Insufficient documentation

## 2013-06-25 DIAGNOSIS — G893 Neoplasm related pain (acute) (chronic): Secondary | ICD-10-CM

## 2013-06-25 DIAGNOSIS — E119 Type 2 diabetes mellitus without complications: Secondary | ICD-10-CM | POA: Insufficient documentation

## 2013-06-25 DIAGNOSIS — Z87448 Personal history of other diseases of urinary system: Secondary | ICD-10-CM | POA: Insufficient documentation

## 2013-06-25 DIAGNOSIS — R000 Tachycardia, unspecified: Secondary | ICD-10-CM | POA: Insufficient documentation

## 2013-06-25 DIAGNOSIS — K7689 Other specified diseases of liver: Secondary | ICD-10-CM | POA: Insufficient documentation

## 2013-06-25 DIAGNOSIS — Z85841 Personal history of malignant neoplasm of brain: Secondary | ICD-10-CM | POA: Insufficient documentation

## 2013-06-25 DIAGNOSIS — Z87891 Personal history of nicotine dependence: Secondary | ICD-10-CM | POA: Insufficient documentation

## 2013-06-25 DIAGNOSIS — M25519 Pain in unspecified shoulder: Secondary | ICD-10-CM | POA: Insufficient documentation

## 2013-06-25 DIAGNOSIS — I10 Essential (primary) hypertension: Secondary | ICD-10-CM | POA: Insufficient documentation

## 2013-06-25 DIAGNOSIS — Z85830 Personal history of malignant neoplasm of bone: Secondary | ICD-10-CM | POA: Insufficient documentation

## 2013-06-25 DIAGNOSIS — F1410 Cocaine abuse, uncomplicated: Secondary | ICD-10-CM | POA: Insufficient documentation

## 2013-06-25 DIAGNOSIS — Z9851 Tubal ligation status: Secondary | ICD-10-CM | POA: Insufficient documentation

## 2013-06-25 DIAGNOSIS — Z853 Personal history of malignant neoplasm of breast: Secondary | ICD-10-CM | POA: Insufficient documentation

## 2013-06-25 DIAGNOSIS — R141 Gas pain: Secondary | ICD-10-CM | POA: Insufficient documentation

## 2013-06-25 DIAGNOSIS — Z96659 Presence of unspecified artificial knee joint: Secondary | ICD-10-CM | POA: Insufficient documentation

## 2013-06-25 DIAGNOSIS — R143 Flatulence: Secondary | ICD-10-CM

## 2013-06-25 DIAGNOSIS — Z79899 Other long term (current) drug therapy: Secondary | ICD-10-CM | POA: Insufficient documentation

## 2013-06-25 DIAGNOSIS — Z87440 Personal history of urinary (tract) infections: Secondary | ICD-10-CM | POA: Insufficient documentation

## 2013-06-25 DIAGNOSIS — IMO0002 Reserved for concepts with insufficient information to code with codable children: Secondary | ICD-10-CM | POA: Insufficient documentation

## 2013-06-25 LAB — CBC WITH DIFFERENTIAL/PLATELET
Basophils Absolute: 0 10*3/uL (ref 0.0–0.1)
Basophils Relative: 1 % (ref 0–1)
EOS ABS: 0.2 10*3/uL (ref 0.0–0.7)
EOS PCT: 2 % (ref 0–5)
HCT: 24.9 % — ABNORMAL LOW (ref 36.0–46.0)
Hemoglobin: 8.2 g/dL — ABNORMAL LOW (ref 12.0–15.0)
Lymphocytes Relative: 11 % — ABNORMAL LOW (ref 12–46)
Lymphs Abs: 0.7 10*3/uL (ref 0.7–4.0)
MCH: 28.7 pg (ref 26.0–34.0)
MCHC: 32.9 g/dL (ref 30.0–36.0)
MCV: 87.1 fL (ref 78.0–100.0)
MONOS PCT: 6 % (ref 3–12)
Monocytes Absolute: 0.4 10*3/uL (ref 0.1–1.0)
Neutro Abs: 5.3 10*3/uL (ref 1.7–7.7)
Neutrophils Relative %: 80 % — ABNORMAL HIGH (ref 43–77)
PLATELETS: 310 10*3/uL (ref 150–400)
RBC: 2.86 MIL/uL — ABNORMAL LOW (ref 3.87–5.11)
RDW: 17.5 % — ABNORMAL HIGH (ref 11.5–15.5)
WBC: 6.6 10*3/uL (ref 4.0–10.5)

## 2013-06-25 LAB — COMPREHENSIVE METABOLIC PANEL
ALT: 71 U/L — ABNORMAL HIGH (ref 0–35)
AST: 178 U/L — ABNORMAL HIGH (ref 0–37)
Albumin: 2.5 g/dL — ABNORMAL LOW (ref 3.5–5.2)
Alkaline Phosphatase: 1175 U/L — ABNORMAL HIGH (ref 39–117)
BUN: 10 mg/dL (ref 6–23)
CALCIUM: 10.5 mg/dL (ref 8.4–10.5)
CO2: 23 mEq/L (ref 19–32)
Chloride: 99 mEq/L (ref 96–112)
Creatinine, Ser: 0.62 mg/dL (ref 0.50–1.10)
GFR calc Af Amer: >90 mL/min (ref >90)
GFR calc non Af Amer: >90 mL/min (ref >90)
GLUCOSE: 113 mg/dL — AB (ref 70–99)
Potassium: 3.6 mEq/L — ABNORMAL LOW (ref 3.7–5.3)
Sodium: 137 mEq/L (ref 137–147)
TOTAL PROTEIN: 6.7 g/dL (ref 6.0–8.3)
Total Bilirubin: 0.9 mg/dL (ref 0.3–1.2)

## 2013-06-25 LAB — AMMONIA: Ammonia: 62 umol/L — ABNORMAL HIGH (ref 11–60)

## 2013-06-25 MED ORDER — SODIUM CHLORIDE 0.9 % IV SOLN
INTRAVENOUS | Status: DC
  Administered 2013-06-25: 13:00:00 via INTRAVENOUS

## 2013-06-25 MED ORDER — LACTULOSE 10 G PO PACK: 10.0000 g | PACK | Freq: Three times a day (TID) | ORAL | Status: DC

## 2013-06-25 MED ORDER — SODIUM CHLORIDE 0.9 % IV BOLUS (SEPSIS)
1000.0000 mL | Freq: Once | INTRAVENOUS | Status: AC
  Administered 2013-06-25: 1000 mL via INTRAVENOUS

## 2013-06-25 NOTE — ED Provider Notes (Addendum)
CSN: [id number]     Arrival date & time 06/25/13  1113 History   First MD Initiated Contact with Patient 06/25/13 1120     Chief Complaint  Patient presents with  . Abdominal Pain   (Consider location/radiation/quality/duration/timing/severity/associated sxs/prior Treatment) Patient is a 48 y.o. female presenting with abdominal pain. The history is provided by the patient.  Abdominal Pain  patient here complaining of pain to her left shoulder abdomen and spine. She has a history of metastatic breast cancer and has not had any vomiting or fever recently. A review of the patient's medical record, it is noted that she does have a history of polysubstance abuse. EMS noted that she had a polyp morphine tablets prescribed yesterday and that more than 90% of the tablets are missing. Patient admits to me that she used cocaine this week. Her pain has been persistent and worse with movement and is similar to her prior pain. Notes some increased abdominal distention without bloody diarrhea. She is currently in hospice treatment.  Past Medical History  Diagnosis Date  . Hypertension   . GERD (gastroesophageal reflux disease)   . Pyelonephritis 10/01/11  . Recurrent UTI (urinary tract infection) 10/01/11  . S/P chemotherapy, time since 4-12 weeks   . S/P radiation > 12 weeks 03/15/12- 04/07/12    right hip, clivus  . Hypercalcemia 06/21/2012  . History of radiation therapy 08/06/10 -09/16/10    R supraclav fossa, R chest wall, R drain site  . radiaiton therapy 07/22/12 -08/15/12    T4-T7  . 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 Brain cancer     spread from breast ca   Past Surgical History  Procedure Laterality Date  . Cesarean section  1989; 2010  . Mastectomy  2011    right  . Back surgery  2012    "removed tumor,cancer, from my spine  . Port-a-cath removal  2012    right chest  . Portacath placement  2011    right    . Tubal ligation  2010  . Breast biopsy  2011    right  . Hip arthroplasty  05/04/2012    Procedure: ARTHROPLASTY BIPOLAR HIP;  Surgeon: Mauri Pole, MD;  Location: [facility];  Service: Orthopedics;  Laterality: Right;  RIGHT HIP HEMI ARTHROPLASTY    Family History  Problem Relation Age of Onset  . Cancer Brother 60    died of colon cancer at age of 63  . High blood pressure Mother    History  Substance Use Topics  . Smoking status: Former Smoker -- 0.25 packs/day for 31 years    Types: Cigarettes    Quit date: 04/26/2012  . Smokeless tobacco: Never Used  . Alcohol Use: No   OB History   Grav Para Term Preterm Abortions TAB SAB Ect Mult Living                 Review of Systems  Gastrointestinal: Positive for abdominal pain.  All other systems reviewed and are negative.    Allergies  Review of patient's allergies indicates no known allergies.  Home Medications   Current Outpatient Rx  Name  Route  Sig  Dispense  Refill  . cyclobenzaprine (FLEXERIL) 10 MG tablet   Oral   Take 1 tablet (10 mg total) by mouth 3 (three) times daily as needed for muscle spasms.   60 tablet  2   . dexamethasone (DECADRON) 4 MG tablet      TAKE TWO TABLETS BY MOUTH TWO TIMES DAILY WITH A MEAL.   120 tablet   0     Hospice Patient   . exemestane (AROMASIN) 25 MG tablet   Oral   Take 1 tablet (25 mg total) by mouth daily after breakfast.   30 tablet   3     [facility]   . fentaNYL (DURAGESIC - DOSED MCG/HR) 100 MCG/HR   Transdermal   Place 1 patch (100 mcg total) onto the skin every 3 (three) days.   5 patch   0     HOSPICE PATIENT//2 weeks supply   . fluconazole (DIFLUCAN) 100 MG tablet   Oral   Take 1 tablet (100 mg total) by mouth daily.   30 tablet   0   . LORazepam (ATIVAN) 1 MG tablet   Oral   Take 1 tablet (1 mg total) by mouth every 6 (six) hours as needed for anxiety.   30 tablet   0     SPOKE TO PHARMACIST.   [name] morphine (MSIR) 15 MG  tablet   Oral   Take 1 tablet (15 mg total) by mouth every 6 (six) hours as needed for severe pain. May take 1-2 tabs po q 4 hours as needed   30 tablet   0     HOSPICE PATIENT Fax to [facility]   . omeprazole (PRILOSEC) 40 MG capsule   Oral   Take 1 capsule (40 mg total) by mouth daily.   30 capsule   3     [facility]   . ondansetron (ZOFRAN ODT) 4 MG disintegrating tablet      4mg  ODT q4 hours prn nausea/vomit   12 tablet   0   . polyethylene glycol (MIRALAX / GLYCOLAX) packet   Oral   Take 17 g by mouth daily.   14 each   2   . promethazine (PHENERGAN) 25 MG tablet   Oral   Take 1 tablet (25 mg total) by mouth every 6 (six) hours as needed for nausea. [facility]   30 tablet   1   . senna-docusate (SENNA S) 8.6-50 MG per tablet      TAKE TWO TO FOUR TABLETS BY MOUTH TWO TIMES A DAY.   60 tablet   1   . triamterene-hydrochlorothiazide (DYAZIDE) 37.5-25 MG per capsule   Oral   Take 1 each (1 capsule total) by mouth every morning.   30 capsule   12   . zolpidem (AMBIEN) 5 MG tablet   Oral   Take 1 tablet (5 mg total) by mouth at bedtime as needed for sleep.   30 tablet   0     CALLED TO PHARMACIST.    BP 138/78  Pulse 119  Temp(Src) 98.3 F (36.8 C) (Oral)  Resp 16  SpO2 98% Physical Exam  Nursing note and vitals reviewed. Constitutional: She is oriented to person, place, and time. She appears cachectic.  Non-toxic appearance. She has a sickly appearance. No distress.  HENT:  Head: Normocephalic and atraumatic.  Eyes: Conjunctivae, EOM and lids are normal. Pupils are equal, round, and reactive to light.  Neck: Normal range of motion. Neck supple. No tracheal deviation present. No mass present.  Cardiovascular: Regular rhythm and normal heart sounds.  Tachycardia present.  Exam reveals no gallop.   No murmur heard. Pulmonary/Chest: Effort normal  and breath sounds normal. No stridor. No respiratory distress.  She has no decreased breath sounds. She has no wheezes. She has no rhonchi. She has no rales.  Abdominal: Soft. Normal appearance and bowel sounds are normal. She exhibits distension. There is hepatosplenomegaly. There is tenderness in the right upper quadrant. There is no rebound and no CVA tenderness.    Musculoskeletal: Normal range of motion. She exhibits no edema and no tenderness.       Arms: Neurological: She is alert and oriented to person, place, and time. She has normal strength. No cranial nerve deficit or sensory deficit. GCS eye subscore is 4. GCS verbal subscore is 5. GCS motor subscore is 6.  Skin: Skin is warm and dry. No abrasion and no rash noted.  Psychiatric: She has a normal mood and affect. Her speech is normal and behavior is normal.    ED Course  Procedures (including critical care time) Labs Review Labs Reviewed  CBC WITH DIFFERENTIAL  COMPREHENSIVE METABOLIC PANEL  AMMONIA   Imaging Review No results found.  EKG Interpretation   None       MDM  No diagnosis found. Attempted to contact the hospice nurse without success. Patient admits to cocaine use currently. Have instructed the patient that she needs a followup at the cancer center tomorrow if she needs better pain control. Patient was given IV fluids here. Heart rate has improved. She is comfortable here and is in no distress. On repeat evaluation multiple times patient had to be aroused to speak with her. Her ammonia level is mildly elevated but I do not believe that she has hepatic encephalopathy. I will prescribe the patient lactulose. Given the fact that the patient has used 27 morphine tablets in the past 24 hours and concern for medication diversion I will not be prescribing patient more pain meds at this time  2:05 PM Spoke with Dr. Lovena Le from pallative care and patient to have a hospice nurse visit her this evening  Leota Jacobsen, MD 06/25/13 [place], MD 06/25/13  1339  Leota Jacobsen, MD 06/25/13 [place], MD 06/25/13 1406

## 2013-06-25 NOTE — ED Notes (Signed)
She c/o recalcitrant abd. Pain.  She states she has "metastatic cancer".  She is c/o generalized pain; even though she is drowsy.  Paramedics found a bottle of #30 morphine tablets prescribed yesterday; and they noted that 4 tablets remain in the bottle this morning.  She is in no distress; and is oriented x 4.

## 2013-06-25 NOTE — ED Notes (Signed)
She continues to rest quietly; repositioning herself from time-to-time.  Her skin is normal, warm and dry and she is breathing normally.

## 2013-06-25 NOTE — ED Notes (Signed)
Spoke with sister at bedside regarding patient current condition and plan fr discharge. Sister states she will take her home at this time and call hospice upon arrival home.

## 2013-06-25 NOTE — Progress Notes (Signed)
Patient [name]      DOB: 17-Jun-1965      PRX:[id number]  Called by Dr. Zenia Resides regard hospice patient's evaluation.  I have spoken with [name] and updated them on her current issue.   Stephenie Navejas L. Lovena Le, MD MBA The Palliative Medicine Team at [facility] Phone: [phone] Pager: [phone]

## 2013-06-25 NOTE — ED Notes (Signed)
Bed: IZ12 Expected date: 06/25/13 Expected time: 11:10 AM Means of arrival: Ambulance Comments: Generalized Pain

## 2013-06-25 NOTE — Discharge Instructions (Signed)
Followup at the cancer center tomorrow.

## 2013-06-29 ENCOUNTER — Telehealth: Payer: Self-pay | Admitting: *Deleted

## 2013-06-29 ENCOUNTER — Encounter (HOSPITAL_COMMUNITY): Payer: Self-pay | Admitting: Emergency Medicine

## 2013-06-29 ENCOUNTER — Emergency Department (HOSPITAL_COMMUNITY): Payer: Medicaid Other

## 2013-06-29 ENCOUNTER — Ambulatory Visit: Payer: Medicaid Other | Admitting: Oncology

## 2013-06-29 ENCOUNTER — Emergency Department (HOSPITAL_COMMUNITY)
Admission: EM | Admit: 2013-06-29 | Discharge: 2013-06-29 | Disposition: A | Discharge status: Home / Self Care | Payer: Medicaid Other | Attending: Emergency Medicine | Admitting: Emergency Medicine

## 2013-06-29 DIAGNOSIS — Z87440 Personal history of urinary (tract) infections: Secondary | ICD-10-CM | POA: Insufficient documentation

## 2013-06-29 DIAGNOSIS — J40 Bronchitis, not specified as acute or chronic: Secondary | ICD-10-CM

## 2013-06-29 DIAGNOSIS — Z9851 Tubal ligation status: Secondary | ICD-10-CM | POA: Insufficient documentation

## 2013-06-29 DIAGNOSIS — C50919 Malignant neoplasm of unspecified site of unspecified female breast: Secondary | ICD-10-CM | POA: Insufficient documentation

## 2013-06-29 DIAGNOSIS — Z9889 Other specified postprocedural states: Secondary | ICD-10-CM | POA: Insufficient documentation

## 2013-06-29 DIAGNOSIS — Z9221 Personal history of antineoplastic chemotherapy: Secondary | ICD-10-CM | POA: Insufficient documentation

## 2013-06-29 DIAGNOSIS — R1084 Generalized abdominal pain: Secondary | ICD-10-CM | POA: Insufficient documentation

## 2013-06-29 DIAGNOSIS — M546 Pain in thoracic spine: Secondary | ICD-10-CM | POA: Insufficient documentation

## 2013-06-29 DIAGNOSIS — R142 Eructation: Secondary | ICD-10-CM | POA: Insufficient documentation

## 2013-06-29 DIAGNOSIS — Z85830 Personal history of malignant neoplasm of bone: Secondary | ICD-10-CM | POA: Insufficient documentation

## 2013-06-29 DIAGNOSIS — IMO0002 Reserved for concepts with insufficient information to code with codable children: Secondary | ICD-10-CM | POA: Insufficient documentation

## 2013-06-29 DIAGNOSIS — K219 Gastro-esophageal reflux disease without esophagitis: Secondary | ICD-10-CM | POA: Insufficient documentation

## 2013-06-29 DIAGNOSIS — R141 Gas pain: Secondary | ICD-10-CM | POA: Insufficient documentation

## 2013-06-29 DIAGNOSIS — R112 Nausea with vomiting, unspecified: Secondary | ICD-10-CM | POA: Insufficient documentation

## 2013-06-29 DIAGNOSIS — G8929 Other chronic pain: Secondary | ICD-10-CM | POA: Insufficient documentation

## 2013-06-29 DIAGNOSIS — M545 Low back pain, unspecified: Secondary | ICD-10-CM | POA: Insufficient documentation

## 2013-06-29 DIAGNOSIS — R143 Flatulence: Secondary | ICD-10-CM

## 2013-06-29 DIAGNOSIS — J209 Acute bronchitis, unspecified: Secondary | ICD-10-CM | POA: Insufficient documentation

## 2013-06-29 DIAGNOSIS — Z85841 Personal history of malignant neoplasm of brain: Secondary | ICD-10-CM | POA: Insufficient documentation

## 2013-06-29 DIAGNOSIS — M7989 Other specified soft tissue disorders: Secondary | ICD-10-CM | POA: Insufficient documentation

## 2013-06-29 DIAGNOSIS — R000 Tachycardia, unspecified: Secondary | ICD-10-CM | POA: Insufficient documentation

## 2013-06-29 DIAGNOSIS — R630 Anorexia: Secondary | ICD-10-CM | POA: Insufficient documentation

## 2013-06-29 DIAGNOSIS — I10 Essential (primary) hypertension: Secondary | ICD-10-CM | POA: Insufficient documentation

## 2013-06-29 DIAGNOSIS — M542 Cervicalgia: Secondary | ICD-10-CM | POA: Insufficient documentation

## 2013-06-29 DIAGNOSIS — Z87891 Personal history of nicotine dependence: Secondary | ICD-10-CM | POA: Insufficient documentation

## 2013-06-29 DIAGNOSIS — Z79899 Other long term (current) drug therapy: Secondary | ICD-10-CM | POA: Insufficient documentation

## 2013-06-29 LAB — CBC WITH DIFFERENTIAL/PLATELET
BASOS PCT: 0 % (ref 0–1)
Basophils Absolute: 0 10*3/uL (ref 0.0–0.1)
Eosinophils Absolute: 0.2 10*3/uL (ref 0.0–0.7)
Eosinophils Relative: 3 % (ref 0–5)
HCT: 26.6 % — ABNORMAL LOW (ref 36.0–46.0)
Hemoglobin: 8.8 g/dL — ABNORMAL LOW (ref 12.0–15.0)
LYMPHS ABS: 1.3 10*3/uL (ref 0.7–4.0)
Lymphocytes Relative: 19 % (ref 12–46)
MCH: 28.6 pg (ref 26.0–34.0)
MCHC: 33.1 g/dL (ref 30.0–36.0)
MCV: 86.4 fL (ref 78.0–100.0)
MONOS PCT: 7 % (ref 3–12)
Monocytes Absolute: 0.5 10*3/uL (ref 0.1–1.0)
NEUTROS ABS: 4.9 10*3/uL (ref 1.7–7.7)
NEUTROS PCT: 71 % (ref 43–77)
Platelets: 343 10*3/uL (ref 150–400)
RBC: 3.08 MIL/uL — AB (ref 3.87–5.11)
RDW: 17.6 % — ABNORMAL HIGH (ref 11.5–15.5)
WBC: 6.8 10*3/uL (ref 4.0–10.5)

## 2013-06-29 LAB — BASIC METABOLIC PANEL
BUN: 13 mg/dL (ref 6–23)
CHLORIDE: 93 meq/L — AB (ref 96–112)
CO2: 23 mEq/L (ref 19–32)
Calcium: 10.8 mg/dL — ABNORMAL HIGH (ref 8.4–10.5)
Creatinine, Ser: 0.81 mg/dL (ref 0.50–1.10)
GFR calc non Af Amer: 85 mL/min — ABNORMAL LOW (ref >90)
GLUCOSE: 119 mg/dL — AB (ref 70–99)
POTASSIUM: 3.7 meq/L (ref 3.7–5.3)
Sodium: 133 mEq/L — ABNORMAL LOW (ref 137–147)

## 2013-06-29 LAB — URINALYSIS, ROUTINE W REFLEX MICROSCOPIC
BILIRUBIN URINE: NEGATIVE
Glucose, UA: NEGATIVE mg/dL
Hgb urine dipstick: NEGATIVE
Ketones, ur: NEGATIVE mg/dL
NITRITE: NEGATIVE
PH: 7 (ref 5.0–8.0)
Protein, ur: NEGATIVE mg/dL
SPECIFIC GRAVITY, URINE: 1.011 (ref 1.005–1.030)
Urobilinogen, UA: 2 mg/dL — ABNORMAL HIGH (ref 0.0–1.0)

## 2013-06-29 LAB — URINE MICROSCOPIC-ADD ON

## 2013-06-29 MED ORDER — SODIUM CHLORIDE 0.9 % IV BOLUS (SEPSIS)
1000.0000 mL | Freq: Once | INTRAVENOUS | Status: AC
  Administered 2013-06-29: 1000 mL via INTRAVENOUS

## 2013-06-29 MED ORDER — GUAIFENESIN 100 MG/5ML PO SOLN: 5.0000 mL | ORAL | Status: DC | PRN

## 2013-06-29 MED ORDER — HYDROMORPHONE HCL PF 2 MG/ML IJ SOLN
2.0000 mg | Freq: Once | INTRAMUSCULAR | Status: AC
  Administered 2013-06-29: 2 mg via INTRAVENOUS
  Filled 2013-06-29: qty 1

## 2013-06-29 MED ORDER — ONDANSETRON HCL 4 MG/2ML IJ SOLN
4.0000 mg | Freq: Once | INTRAMUSCULAR | Status: AC
  Administered 2013-06-29: 4 mg via INTRAVENOUS
  Filled 2013-06-29: qty 2

## 2013-06-29 MED ORDER — LEVOFLOXACIN 500 MG PO TABS: 500.0000 mg | ORAL_TABLET | Freq: Every day | ORAL | Status: DC

## 2013-06-29 MED ORDER — BENZONATATE 100 MG PO CAPS: 100.0000 mg | ORAL_CAPSULE | Freq: Three times a day (TID) | ORAL | Status: DC

## 2013-06-29 MED ORDER — ALBUTEROL SULFATE (2.5 MG/3ML) 0.083% IN NEBU
5.0000 mg | INHALATION_SOLUTION | Freq: Once | RESPIRATORY_TRACT | Status: AC
  Administered 2013-06-29: 5 mg via RESPIRATORY_TRACT
  Filled 2013-06-29: qty 6

## 2013-06-29 NOTE — ED Notes (Signed)
GA given to patient for PO challenge

## 2013-06-29 NOTE — ED Notes (Signed)
Pt states she has cancer and is aching all over  Pt states she is having pain on her right side and her back  Pt states she has not been feeling well for the past two weeks  Pt states she is coughing up yellow/green phlegm  Pt states she has had vomiting as well

## 2013-06-29 NOTE — Telephone Encounter (Signed)
Received call today from Efrain Sella, Director of patient care, called to inform us that patient has been issued a letter of possible discharge due to staff safety, inability to contact patient, lack of patient follow up, scheduled broken appointments and verbal abuse to staff. Notified Dr Jana Hakim for his visit with patient today of this information.

## 2013-06-29 NOTE — Discharge Instructions (Signed)
Please read and follow all provided instructions.  Your diagnoses today include:  1. Bronchitis   2. Chronic pain     Tests performed today include:  Chest x-ray - does not show any pneumonia  Blood counts and electrolytes  Vital signs. See below for your results today.   Medications prescribed:   Levaquin - antibiotic  You have been prescribed an antibiotic medicine: take the entire course of medicine even if you are feeling better. Stopping early can cause the antibiotic not to work.   Tessalon Perles - cough suppressant medication  Take any prescribed medications only as directed.  Home care instructions:  Follow any educational materials contained in this packet.  Follow-up instructions: Please follow-up with your primary care provider in the next 3 days for further evaluation of your symptoms and a recheck if you are not feeling better. If you do not have a primary care doctor -- see below for referral information.   Return instructions:   Please return to the Emergency Department if you experience worsening symptoms.  Please return with worsening wheezing, shortness of breath, or difficulty breathing.  Return with persistent fever above 101F.   Please return if you have any other emergent concerns.  Additional Information:  Your vital signs today were: BP 118/68   Pulse 110   Temp(Src) 98.1 F (36.7 C) (Oral)   Resp 18   Ht 5\' 2"  (1.575 m)   Wt 101 lb (45.813 kg)   BMI 18.47 kg/m2   SpO2 100% If your blood pressure (BP) was elevated above 135/85 this visit, please have this repeated by your doctor within one month. --------------

## 2013-06-29 NOTE — ED Provider Notes (Signed)
CSN: [id number]     Arrival date & time 06/29/13  1920 History   First MD Initiated Contact with Patient 06/29/13 1941     Chief Complaint  Patient presents with  . Emesis  . Generalized Body Aches   (Consider location/radiation/quality/duration/timing/severity/associated sxs/prior Treatment) HPI Comments: Patient with metastatic breast CA, on hospice, h/o polysubstance abuse -- presents with flu-like symptoms c/o pain all over, cough x 1 week, vomiting today. She states that she has been weak, not eating, and not getting out of bed. No SOB. No dysuria, hematuria, flank pain, frequency/urgency. She has chest tightness worse with coughing. She is taking chronic meds. The onset of this condition was incidious. The course is constant. Aggravating factors: none. Alleviating factors: none.     Patient is a 48 y.o. female presenting with vomiting. The history is provided by the patient and medical records.  Emesis Associated symptoms: abdominal pain and diarrhea   Associated symptoms: no chills, no headaches, no myalgias and no sore throat     Past Medical History  Diagnosis Date  . Hypertension   . GERD (gastroesophageal reflux disease)   . Pyelonephritis 10/01/11  . Recurrent UTI (urinary tract infection) 10/01/11  . S/P chemotherapy, time since 4-12 weeks   . S/P radiation > 12 weeks 03/15/12- 04/07/12    right hip, clivus  . Hypercalcemia 06/21/2012  . History of radiation therapy 08/06/10 -09/16/10    R supraclav fossa, R chest wall, R drain site  . radiaiton therapy 07/22/12 -08/15/12    T4-T7  . 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 Brain cancer     spread from breast ca   Past Surgical History  Procedure Laterality Date  . Cesarean section  1989; 2010  . Mastectomy  2011    right  . Back surgery  2012    "removed tumor,cancer, from my spine  . Port-a-cath removal  2012    right chest  . Portacath  placement  2011    right  . Tubal ligation  2010  . Breast biopsy  2011    right  . Hip arthroplasty  05/04/2012    Procedure: ARTHROPLASTY BIPOLAR HIP;  Surgeon: Mauri Pole, MD;  Location: [facility];  Service: Orthopedics;  Laterality: Right;  RIGHT HIP HEMI ARTHROPLASTY    Family History  Problem Relation Age of Onset  . Cancer Brother 73    died of colon cancer at age of 78  . High blood pressure Mother    History  Substance Use Topics  . Smoking status: Former Smoker -- 0.25 packs/day for 31 years    Types: Cigarettes    Quit date: 04/26/2012  . Smokeless tobacco: Never Used  . Alcohol Use: No   OB History   Grav Para Term Preterm Abortions TAB SAB Ect Mult Living                 Review of Systems  Constitutional: Positive for appetite change. Negative for fever, chills and activity change.  HENT: Positive for congestion and rhinorrhea. Negative for sore throat.   Eyes: Negative for redness.  Respiratory: Positive for cough. Negative for shortness of breath.   Cardiovascular: Positive for chest pain. Negative for leg swelling.  Gastrointestinal: Positive for nausea, vomiting, abdominal pain and diarrhea.  Genitourinary: Negative for dysuria.  Musculoskeletal: Negative for myalgias.  Skin: Negative for rash.  Neurological:  Negative for headaches.    Allergies  Review of patient's allergies indicates no known allergies.  Home Medications   Current Outpatient Rx  Name  Route  Sig  Dispense  Refill  . senna-docusate (SENOKOT-S) 8.6-50 MG per tablet   Oral   Take 2-4 tablets by mouth 2 (two) times daily.         . cyclobenzaprine (FLEXERIL) 10 MG tablet   Oral   Take 1 tablet (10 mg total) by mouth 3 (three) times daily as needed for muscle spasms.   60 tablet   2   . dexamethasone (DECADRON) 4 MG tablet      TAKE TWO TABLETS BY MOUTH TWO TIMES DAILY WITH A MEAL.   120 tablet   0     Hospice Patient   . exemestane (AROMASIN) 25 MG tablet   Oral    Take 1 tablet (25 mg total) by mouth daily after breakfast.   30 tablet   3     [facility]   . fentaNYL (DURAGESIC - DOSED MCG/HR) 100 MCG/HR   Transdermal   Place 1 patch (100 mcg total) onto the skin every 3 (three) days.   5 patch   0     HOSPICE PATIENT//2 weeks supply   . fluconazole (DIFLUCAN) 100 MG tablet   Oral   Take 1 tablet (100 mg total) by mouth daily.   30 tablet   0   . lactulose (CEPHULAC) 10 G packet   Oral   Take 1 packet (10 g total) by mouth 3 (three) times daily.   30 each   0   . LORazepam (ATIVAN) 1 MG tablet   Oral   Take 1 tablet (1 mg total) by mouth every 6 (six) hours as needed for anxiety.   30 tablet   0     SPOKE TO PHARMACIST.   [name] morphine (MSIR) 15 MG tablet   Oral   Take 1 tablet (15 mg total) by mouth every 6 (six) hours as needed for severe pain. May take 1-2 tabs po q 4 hours as needed   30 tablet   0     HOSPICE PATIENT Fax to [facility]   . omeprazole (PRILOSEC) 40 MG capsule   Oral   Take 1 capsule (40 mg total) by mouth daily.   30 capsule   3     [facility]   . ondansetron (ZOFRAN ODT) 4 MG disintegrating tablet      4mg  ODT q4 hours prn nausea/vomit   12 tablet   0   . polyethylene glycol (MIRALAX / GLYCOLAX) packet   Oral   Take 17 g by mouth daily.   14 each   2   . promethazine (PHENERGAN) 25 MG tablet   Oral   Take 1 tablet (25 mg total) by mouth every 6 (six) hours as needed for nausea. [facility]   30 tablet   1   . triamterene-hydrochlorothiazide (DYAZIDE) 37.5-25 MG per capsule   Oral   Take 1 each (1 capsule total) by mouth every morning.   30 capsule   12   . zolpidem (AMBIEN) 5 MG tablet   Oral   Take 1 tablet (5 mg total) by mouth at bedtime as needed for sleep.   30 tablet   0     CALLED TO PHARMACIST.    BP 118/68  Pulse 110  Temp(Src) 98.1 F (36.7 C) (Oral)  Resp 18  Ht 5\' 2"  (1.575 m)  Wt 101 lb (45.813 kg)  BMI  18.47 kg/m2  SpO2 100% Physical Exam  Nursing note and vitals reviewed. Constitutional: She appears well-developed. She appears cachectic.  HENT:  Head: Normocephalic and atraumatic.  Mouth/Throat: Oropharynx is clear and moist. No oropharyngeal exudate.  Eyes: Conjunctivae are normal. Right eye exhibits no discharge. Left eye exhibits no discharge.  Neck: Normal range of motion. Neck supple.  Cardiovascular: Regular rhythm and normal heart sounds.  Tachycardia present.   No murmur heard. Pulmonary/Chest: Effort normal and breath sounds normal. No respiratory distress. She has no wheezes. She has no rales.  Abdominal: Soft. She exhibits distension. There is hepatomegaly. There is generalized tenderness (generalized, mild). There is no rebound, no guarding and no CVA tenderness.  Musculoskeletal:       Cervical back: She exhibits tenderness. She exhibits no bony tenderness.       Thoracic back: She exhibits tenderness. She exhibits no bony tenderness.       Lumbar back: She exhibits tenderness. She exhibits no bony tenderness.  Neurological: She is alert.  Skin: Skin is warm and dry.  Psychiatric: She has a normal mood and affect.    ED Course  Procedures (including critical care time) Labs Review Labs Reviewed  CBC WITH DIFFERENTIAL - Abnormal; Notable for the following:    RBC 3.08 (*)    Hemoglobin 8.8 (*)    HCT 26.6 (*)    RDW 17.6 (*)    All other components within normal limits  BASIC METABOLIC PANEL - Abnormal; Notable for the following:    Sodium 133 (*)    Chloride 93 (*)    Glucose, Bld 119 (*)    Calcium 10.8 (*)    GFR calc non Af Amer 85 (*)    All other components within normal limits  URINALYSIS, ROUTINE W REFLEX MICROSCOPIC - Abnormal; Notable for the following:    Color, Urine AMBER (*)    APPearance CLOUDY (*)    Urobilinogen, UA 2.0 (*)    Leukocytes, UA LARGE (*)    All other components within normal limits  URINE MICROSCOPIC-ADD ON - Abnormal;  Notable for the following:    Bacteria, UA FEW (*)    All other components within normal limits  URINE CULTURE   Imaging Review No results found.  EKG Interpretation   None      Patient seen and examined. Work-up initiated. Medications ordered.   Vital signs reviewed and are as follows: Filed Vitals:   06/29/13 2304  BP: 112/62  Pulse: 86  Temp: 98.2 F (36.8 C)  Resp: 20   Pain medication redosed x1.   Patient discussed with and seen by Dr. Betsey Holiday. Patient okay fir d/c to home.   Patient given antibiotic for cough, cough suppressant. Urged heme/onc f/u and to use pain medication at home.   Patient urged to return with worsening symptoms, changing symptoms or other concerns. Patient verbalized understanding and agrees with plan.     MDM   1. Bronchitis   2. Chronic pain    Patient with known h/o metastatic cancer, on hospice. She has cough with sputum production. No concern for PNA given normal lung exam/x-ray. Will treat with abx, cough suppressant. Urine cx pending, no exam findings of UTI/pyelo.   No acutely dangerous or life-threatening conditions suspected or identified by history, physical exam, and by work-up. No indications for hospitalization identified. Patient is on hospice.     Vonna Kotyk  Rondel Oh, PA-C 06/30/13 0040

## 2013-06-29 NOTE — ED Notes (Signed)
Patient transported to X-ray 

## 2013-06-30 ENCOUNTER — Telehealth: Payer: Self-pay | Admitting: *Deleted

## 2013-06-30 LAB — URINE CULTURE
COLONY COUNT: NO GROWTH
Culture: NO GROWTH

## 2013-06-30 NOTE — ED Provider Notes (Signed)
Medical screening examination/treatment/procedure(s) were conducted as a shared visit with non-physician practitioner(s) and myself.  I personally evaluated the patient during the encounter.  EKG Interpretation   None      Patient seen and evaluated for cough and generalized weakness. She does have a history of metastatic cancer. There is no concern for PE in this patient. Her chest x-ray did not show any evidence of pneumonia and the remainder of her workup was unremarkable. The patient will be discharged with treatment for bronchitis.   Orpah Greek, MD 06/30/13 [phone]

## 2013-06-30 NOTE — Telephone Encounter (Signed)
[name] calling from Hospice to get Rx faxed in for patient refills. However, it was explained to patient last week and patient was reminded of appt on Thursday with Dr Jana Hakim. Patient did not come to our office for this appt, and Dr Jana Hakim is out of office on fridays. This nurse unable to get Rx filled for patient due to guidelines in place for this patient. Patient must call us to reschedule and go to ER for pain assessment if she is in need of medication. [name] states she will call patient with suggestions.

## 2013-07-03 ENCOUNTER — Telehealth: Payer: Self-pay | Admitting: *Deleted

## 2013-07-03 NOTE — Telephone Encounter (Signed)
This RN received message from [name] stating pt needs refill on pain medication. Return call number given as [phone].  This RN returned call to [name] and inquired per update regarding if hospice still caring for pt due to note from 1/29.  Per [name] she states - they made home visit on Friday and gave pt an agreement of care that she needs to comply or she will be discharged from their services.  This RN informed [name] of scheduled appointment tomorrow and need for MD to see pt to obtain refill on medication.  [name] was unaware of appointment and she states she will call pt in am to remind her of appointment and need to maintain for assessment and refill of medications.

## 2013-07-03 NOTE — Telephone Encounter (Signed)
Pt called for an appt. gv her an appt for 07/04/13 w/ labs@ 11:30am and ov@ 12 noon...td

## 2013-07-04 ENCOUNTER — Encounter (HOSPITAL_COMMUNITY): Payer: Self-pay | Admitting: *Deleted

## 2013-07-04 ENCOUNTER — Inpatient Hospital Stay (HOSPITAL_COMMUNITY)
Admission: AD | Admit: 2013-07-04 | Discharge: 2013-07-06 | DRG: 947 | Disposition: A | Discharge status: Hospice | Source: Ambulatory Visit | Attending: Oncology | Admitting: Oncology

## 2013-07-04 ENCOUNTER — Ambulatory Visit (HOSPITAL_BASED_OUTPATIENT_CLINIC_OR_DEPARTMENT_OTHER): Payer: Medicaid Other

## 2013-07-04 ENCOUNTER — Other Ambulatory Visit: Payer: Self-pay | Admitting: *Deleted

## 2013-07-04 ENCOUNTER — Ambulatory Visit: Payer: Medicaid Other | Admitting: Oncology

## 2013-07-04 ENCOUNTER — Other Ambulatory Visit (HOSPITAL_BASED_OUTPATIENT_CLINIC_OR_DEPARTMENT_OTHER): Payer: Medicaid Other

## 2013-07-04 VITALS — BP 130/79 | HR 128 | Temp 96.2°F | Resp 18 | Ht 62.0 in | Wt 96.2 lb

## 2013-07-04 DIAGNOSIS — Z515 Encounter for palliative care: Secondary | ICD-10-CM

## 2013-07-04 DIAGNOSIS — Z96649 Presence of unspecified artificial hip joint: Secondary | ICD-10-CM

## 2013-07-04 DIAGNOSIS — R748 Abnormal levels of other serum enzymes: Secondary | ICD-10-CM

## 2013-07-04 DIAGNOSIS — R109 Unspecified abdominal pain: Secondary | ICD-10-CM

## 2013-07-04 DIAGNOSIS — C7951 Secondary malignant neoplasm of bone: Secondary | ICD-10-CM

## 2013-07-04 DIAGNOSIS — C50519 Malignant neoplasm of lower-outer quadrant of unspecified female breast: Secondary | ICD-10-CM

## 2013-07-04 DIAGNOSIS — Z853 Personal history of malignant neoplasm of breast: Secondary | ICD-10-CM

## 2013-07-04 DIAGNOSIS — C787 Secondary malignant neoplasm of liver and intrahepatic bile duct: Principal | ICD-10-CM

## 2013-07-04 DIAGNOSIS — Z170 Estrogen receptor positive status [ER+]: Secondary | ICD-10-CM

## 2013-07-04 DIAGNOSIS — C50919 Malignant neoplasm of unspecified site of unspecified female breast: Secondary | ICD-10-CM

## 2013-07-04 DIAGNOSIS — T4275XA Adverse effect of unspecified antiepileptic and sedative-hypnotic drugs, initial encounter: Secondary | ICD-10-CM | POA: Diagnosis present

## 2013-07-04 DIAGNOSIS — Z8505 Personal history of malignant neoplasm of liver: Secondary | ICD-10-CM

## 2013-07-04 DIAGNOSIS — E860 Dehydration: Secondary | ICD-10-CM

## 2013-07-04 DIAGNOSIS — H532 Diplopia: Secondary | ICD-10-CM | POA: Diagnosis present

## 2013-07-04 DIAGNOSIS — Z9221 Personal history of antineoplastic chemotherapy: Secondary | ICD-10-CM

## 2013-07-04 DIAGNOSIS — K219 Gastro-esophageal reflux disease without esophagitis: Secondary | ICD-10-CM | POA: Diagnosis present

## 2013-07-04 DIAGNOSIS — Z66 Do not resuscitate: Secondary | ICD-10-CM | POA: Diagnosis present

## 2013-07-04 DIAGNOSIS — R188 Other ascites: Secondary | ICD-10-CM

## 2013-07-04 DIAGNOSIS — R1011 Right upper quadrant pain: Secondary | ICD-10-CM

## 2013-07-04 DIAGNOSIS — G893 Neoplasm related pain (acute) (chronic): Principal | ICD-10-CM | POA: Diagnosis present

## 2013-07-04 DIAGNOSIS — K5909 Other constipation: Secondary | ICD-10-CM | POA: Diagnosis present

## 2013-07-04 DIAGNOSIS — C719 Malignant neoplasm of brain, unspecified: Secondary | ICD-10-CM

## 2013-07-04 DIAGNOSIS — Z9010 Acquired absence of unspecified breast and nipple: Secondary | ICD-10-CM

## 2013-07-04 DIAGNOSIS — B370 Candidal stomatitis: Secondary | ICD-10-CM | POA: Diagnosis present

## 2013-07-04 DIAGNOSIS — C7952 Secondary malignant neoplasm of bone marrow: Secondary | ICD-10-CM

## 2013-07-04 DIAGNOSIS — Z87440 Personal history of urinary (tract) infections: Secondary | ICD-10-CM

## 2013-07-04 DIAGNOSIS — D649 Anemia, unspecified: Secondary | ICD-10-CM

## 2013-07-04 DIAGNOSIS — Z681 Body mass index (BMI) 19 or less, adult: Secondary | ICD-10-CM

## 2013-07-04 DIAGNOSIS — Z923 Personal history of irradiation: Secondary | ICD-10-CM

## 2013-07-04 DIAGNOSIS — M549 Dorsalgia, unspecified: Secondary | ICD-10-CM

## 2013-07-04 DIAGNOSIS — R5383 Other fatigue: Secondary | ICD-10-CM

## 2013-07-04 DIAGNOSIS — R000 Tachycardia, unspecified: Secondary | ICD-10-CM

## 2013-07-04 DIAGNOSIS — K5900 Constipation, unspecified: Secondary | ICD-10-CM

## 2013-07-04 DIAGNOSIS — E43 Unspecified severe protein-calorie malnutrition: Secondary | ICD-10-CM | POA: Diagnosis present

## 2013-07-04 DIAGNOSIS — R5381 Other malaise: Secondary | ICD-10-CM

## 2013-07-04 DIAGNOSIS — I10 Essential (primary) hypertension: Secondary | ICD-10-CM | POA: Diagnosis present

## 2013-07-04 HISTORY — DX: Encounter for palliative care: Z51.5

## 2013-07-04 LAB — CBC WITH DIFFERENTIAL/PLATELET
BASO%: 0.6 % (ref 0.0–2.0)
BASOS ABS: 0 10*3/uL (ref 0.0–0.1)
EOS ABS: 0.1 10*3/uL (ref 0.0–0.5)
EOS%: 1.7 % (ref 0.0–7.0)
HEMATOCRIT: 25.5 % — AB (ref 34.8–46.6)
HEMOGLOBIN: 8.7 g/dL — AB (ref 11.6–15.9)
LYMPH#: 1 10*3/uL (ref 0.9–3.3)
LYMPH%: 13.1 % — ABNORMAL LOW (ref 14.0–49.7)
MCH: 29.2 pg (ref 25.1–34.0)
MCHC: 34 g/dL (ref 31.5–36.0)
MCV: 86 fL (ref 79.5–101.0)
MONO#: 0.5 10*3/uL (ref 0.1–0.9)
MONO%: 6.4 % (ref 0.0–14.0)
NEUT#: 5.7 10*3/uL (ref 1.5–6.5)
NEUT%: 78.2 % — ABNORMAL HIGH (ref 38.4–76.8)
Platelets: 388 10*3/uL (ref 145–400)
RBC: 2.97 10*6/uL — ABNORMAL LOW (ref 3.70–5.45)
RDW: 18.6 % — AB (ref 11.2–14.5)
WBC: 7.3 10*3/uL (ref 3.9–10.3)

## 2013-07-04 LAB — CREATININE, SERUM
Creatinine, Ser: 0.88 mg/dL (ref 0.50–1.10)
GFR calc Af Amer: 89 mL/min — ABNORMAL LOW (ref >90)
GFR calc non Af Amer: 76 mL/min — ABNORMAL LOW (ref >90)

## 2013-07-04 LAB — CBC
HEMATOCRIT: 23.5 % — AB (ref 36.0–46.0)
Hemoglobin: 7.9 g/dL — ABNORMAL LOW (ref 12.0–15.0)
MCH: 28.5 pg (ref 26.0–34.0)
MCHC: 33.6 g/dL (ref 30.0–36.0)
MCV: 84.8 fL (ref 78.0–100.0)
Platelets: 355 10*3/uL (ref 150–400)
RBC: 2.77 MIL/uL — ABNORMAL LOW (ref 3.87–5.11)
RDW: 17.6 % — ABNORMAL HIGH (ref 11.5–15.5)
WBC: 6.4 10*3/uL (ref 4.0–10.5)

## 2013-07-04 LAB — COMPREHENSIVE METABOLIC PANEL (CC13)
ALBUMIN: 2.7 g/dL — AB (ref 3.5–5.0)
ALT: 44 U/L (ref 0–55)
ANION GAP: 15 meq/L — AB (ref 3–11)
AST: 88 U/L — AB (ref 5–34)
Alkaline Phosphatase: 1083 U/L — ABNORMAL HIGH (ref 40–150)
BUN: 13.9 mg/dL (ref 7.0–26.0)
CO2: 20 meq/L — AB (ref 22–29)
CREATININE: 0.8 mg/dL (ref 0.6–1.1)
Calcium: 11.2 mg/dL — ABNORMAL HIGH (ref 8.4–10.4)
Chloride: 102 mEq/L (ref 98–109)
Glucose: 89 mg/dl (ref 70–140)
POTASSIUM: 3.7 meq/L (ref 3.5–5.1)
Sodium: 136 mEq/L (ref 136–145)
Total Bilirubin: 1.51 mg/dL — ABNORMAL HIGH (ref 0.20–1.20)
Total Protein: 6.8 g/dL (ref 6.4–8.3)

## 2013-07-04 MED ORDER — SENNOSIDES-DOCUSATE SODIUM 8.6-50 MG PO TABS
2.0000 | ORAL_TABLET | Freq: Two times a day (BID) | ORAL | Status: DC
  Administered 2013-07-04 – 2013-07-06 (×4): 2 via ORAL
  Filled 2013-07-04 (×4): qty 4
  Filled 2013-07-04: qty 2

## 2013-07-04 MED ORDER — POLYETHYLENE GLYCOL 3350 17 G PO PACK
17.0000 g | PACK | Freq: Every day | ORAL | Status: DC
  Filled 2013-07-04 (×3): qty 1

## 2013-07-04 MED ORDER — DEXAMETHASONE 4 MG PO TABS
8.0000 mg | ORAL_TABLET | Freq: Two times a day (BID) | ORAL | Status: DC
  Administered 2013-07-06: 8 mg via ORAL
  Filled 2013-07-04 (×5): qty 2

## 2013-07-04 MED ORDER — MORPHINE SULFATE 15 MG PO TABS
15.0000 mg | ORAL_TABLET | Freq: Four times a day (QID) | ORAL | Status: DC | PRN
  Administered 2013-07-05 – 2013-07-06 (×3): 15 mg via ORAL
  Filled 2013-07-04 (×3): qty 1

## 2013-07-04 MED ORDER — MORPHINE SULFATE 4 MG/ML IJ SOLN
2.0000 mg | Freq: Once | INTRAMUSCULAR | Status: AC
  Administered 2013-07-04: 2 mg via INTRAVENOUS

## 2013-07-04 MED ORDER — ZOLPIDEM TARTRATE 5 MG PO TABS
5.0000 mg | ORAL_TABLET | Freq: Every evening | ORAL | Status: DC | PRN
Start: 2013-07-04 — End: 2013-07-06

## 2013-07-04 MED ORDER — MORPHINE SULFATE 2 MG/ML IJ SOLN: 2.0000 mg | INTRAMUSCULAR | Status: DC | PRN

## 2013-07-04 MED ORDER — ONDANSETRON 8 MG PO TBDP
4.0000 mg | ORAL_TABLET | ORAL | Status: DC | PRN
Start: 2013-07-04 — End: 2013-07-06
  Administered 2013-07-05: 4 mg via ORAL
  Filled 2013-07-04: qty 1

## 2013-07-04 MED ORDER — SODIUM CHLORIDE 0.9 % IV SOLN
INTRAVENOUS | Status: DC
  Administered 2013-07-05 – 2013-07-06 (×3): via INTRAVENOUS

## 2013-07-04 MED ORDER — ENOXAPARIN SODIUM 40 MG/0.4ML ~~LOC~~ SOLN
40.0000 mg | SUBCUTANEOUS | Status: DC
  Administered 2013-07-04 – 2013-07-05 (×2): 40 mg via SUBCUTANEOUS
  Filled 2013-07-04 (×3): qty 0.4

## 2013-07-04 MED ORDER — FLUCONAZOLE 100 MG PO TABS
100.0000 mg | ORAL_TABLET | Freq: Every day | ORAL | Status: DC
  Administered 2013-07-05 – 2013-07-06 (×2): 100 mg via ORAL
  Filled 2013-07-04 (×3): qty 1

## 2013-07-04 MED ORDER — PROMETHAZINE HCL 25 MG PO TABS: 25.0000 mg | ORAL_TABLET | Freq: Four times a day (QID) | ORAL | Status: DC | PRN

## 2013-07-04 MED ORDER — PANTOPRAZOLE SODIUM 40 MG PO TBEC
80.0000 mg | DELAYED_RELEASE_TABLET | Freq: Every day | ORAL | Status: DC
  Administered 2013-07-05 – 2013-07-06 (×2): 80 mg via ORAL
  Filled 2013-07-04 (×2): qty 2

## 2013-07-04 MED ORDER — MORPHINE SULFATE 4 MG/ML IJ SOLN
INTRAMUSCULAR | Status: AC
  Filled 2013-07-04: qty 1

## 2013-07-04 MED ORDER — LORAZEPAM 1 MG PO TABS
1.0000 mg | ORAL_TABLET | Freq: Four times a day (QID) | ORAL | Status: DC | PRN
  Administered 2013-07-05: 1 mg via ORAL
  Filled 2013-07-04: qty 1

## 2013-07-04 MED ORDER — CYCLOBENZAPRINE HCL 10 MG PO TABS
10.0000 mg | ORAL_TABLET | Freq: Three times a day (TID) | ORAL | Status: DC | PRN
  Filled 2013-07-04: qty 1

## 2013-07-04 MED ORDER — SODIUM CHLORIDE 0.9 % IV SOLN
INTRAVENOUS | Status: DC
  Administered 2013-07-04: 16:00:00 via INTRAVENOUS

## 2013-07-04 MED ORDER — MORPHINE SULFATE 10 MG/ML IJ SOLN
1.0000 mg/h | INTRAVENOUS | Status: DC
  Administered 2013-07-04: 1 mg/h via INTRAVENOUS
  Filled 2013-07-04 (×2): qty 10

## 2013-07-04 MED ORDER — FENTANYL 100 MCG/HR TD PT72
100.0000 ug | MEDICATED_PATCH | TRANSDERMAL | Status: DC
  Administered 2013-07-04: 100 ug via TRANSDERMAL
  Filled 2013-07-04: qty 1

## 2013-07-04 MED ORDER — EXEMESTANE 25 MG PO TABS
25.0000 mg | ORAL_TABLET | Freq: Every day | ORAL | Status: DC
  Administered 2013-07-05 – 2013-07-06 (×2): 25 mg via ORAL
  Filled 2013-07-04 (×3): qty 1

## 2013-07-04 NOTE — Progress Notes (Signed)
Patient transferred to room 1301.

## 2013-07-04 NOTE — Progress Notes (Signed)
Agree with above. This RN helped waste 100 mL of morphine iv

## 2013-07-04 NOTE — Progress Notes (Signed)
Primed patient's morphine drip. Began patient's primary fluids. Realized IV was leaking. Called IV team- they attempted 3 times without being able to start a new one. Called anesthesia they will attempt to start new IV. Discussed with pharmacy current morphine bag and agreed to waste it. I wasted it with Patricia Nettle. 100 mL - all of morphine bag was wasted.

## 2013-07-04 NOTE — H&P (Signed)
[facility]  [phone] [phone]     ID: [name] OB: 12-04-1965  MR#: [id number]  UUV#:[id number]  PCP: Pcp Not In System GYN:   SU:  OTHER MD:  CHIEF COMPLAINT: "I'm hurting all the time"  HISTORY OF PRESENT ILLNESS: The patient delivered a child on 05/17/2009, after which she felt fullness in the right breast. She eventually brought this to her doctors attention and was scheduled for mammography and ultrasound on 03/08/2009. There were no prior exams for comparison. Mammography showed the breast to be dense, with a 5 cm obscured mass in the outer midportion of the right breast. Mass was easily palpable, and there was also a mass in the outer right periareolar region. They were suspicious microcalcifications in the outer midportion of the right breast, as well as within the mass itself. Ultrasound showed an extensive heterogeneous area, corresponding to the palpable abnormality with multiple solid and cystic foci. Overall, the mass measured in excess of 5 cm, in addition to cysts in the breast.  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 The tumors were ER +100%, PR +80%. Bilateral breast MRI was obtained 03/22/2009. There was a large area of abnormal enhancement in the lower outer quadrant of the right breast measuring up to 9.7 cm. No abnormal appearing lymph nodes and no other masses in either breast.  Patient underwent definitive right mastectomy and sentinel lymph node sampling on 04/29/2009 under the care of Dr. Dalbert Batman. 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 Repeat prognostic panel showed tumor to be ER +93% and PR +51%. HER-2/neu was negative with a ratio of 0.71, with proliferation marker of 36%.  Patient was evaluated by Dr. Jana Hakim in  January 2011 but failed to keep appointments until she was seen here again in July 2011. In July, she began adjuvant chemotherapy. She received 4 cycles of dose dense doxorubicin and cyclophosphamide followed by 4 weekly doses of paclitaxel which was discontinued in November 2011 secondary to peripheral neuropathy. The patient then received radiation therapy to the right chest wall which was truncated due to problems with missing appointments.  Patient was started on Tamoxifen briefly in June of 2012 which was discontinued due to nausea. The nausea continued, and an abdominal CT in October 2012 confirmed a growth in liver lesions. These were biopsied on 03/10/2011 and showed metastatic adenocarcinoma, still ER positive at 100%, PR +5%. Her subsequent treatments are as detailed below  INTERVAL HISTORY: [name] has missed multiple appointments in our office. She is currently under hospice but lives alone at home. There have been issues relating to possible drug I version and the legal drug use in her apartment. She has received a letter from hospice letting her know that unless these problems are taking care of they will have to discontinue their support. Today the patient came for a scheduled appointment, since her medications had run out. She was in obvious pain, very dehydrated and weak, and we are admitting her for hydration and stabilization. The question is whether she should go back call or return to be can place where she previously had stated for a few weeks  REVIEW OF SYSTEMS: The patient has pain chiefly in her lower abdomen and in her right upper quadrant. She tells me she is not constipated. She doesn't think the pain medicines " are  helping anymore". She denies unusual headaches, visual changes, nausea, or vomiting. She tells me her urine is clear. A detailed review of systems today otherwise was curtailed because of the patient's poorly controlled pain at the time  PAST MEDICAL HISTORY: Past  Medical History  Diagnosis Date  . Hypertension   . GERD (gastroesophageal reflux disease)   . Pyelonephritis 10/01/11  . Recurrent UTI (urinary tract infection) 10/01/11  . S/P chemotherapy, time since 4-12 weeks   . S/P radiation > 12 weeks 03/15/12- 04/07/12    right hip, clivus  . Hypercalcemia 06/21/2012  . History of radiation therapy 08/06/10 -09/16/10    R supraclav fossa, R chest wall, R drain site  . radiaiton therapy 07/22/12 -08/15/12    T4-T7  . 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 Brain cancer     spread from breast ca    PAST SURGICAL HISTORY: Past Surgical History  Procedure Laterality Date  . Cesarean section  1989; 2010  . Mastectomy  2011    right  . Back surgery  2012    "removed tumor,cancer, from my spine  . Port-a-cath removal  2012    right chest  . Portacath placement  2011    right  . Tubal ligation  2010  . Breast biopsy  2011    right  . Hip arthroplasty  05/04/2012    Procedure: ARTHROPLASTY BIPOLAR HIP;  Surgeon: Mauri Pole, MD;  Location: [facility];  Service: Orthopedics;  Laterality: Right;  RIGHT HIP HEMI ARTHROPLASTY     FAMILY HISTORY Family History  Problem Relation Age of Onset  . Cancer Brother 57    died of colon cancer at age of 35  . High blood pressure Mother     GYNECOLOGIC HISTORY:  She is GX P2. First pregnancy to term at age 34.   SOCIAL HISTORY:  The patientlives by herself (her small son whose name is [name] lives with his dad in [place]). Her first child, [name], lives in this area. She and her daughter are not in frequent contact, but the patient states "we do talk". The patient has one grandchild. She attends the [employer].      ADVANCED DIRECTIVES: DNR in place   HEALTH MAINTENANCE: History  Substance Use Topics  . Smoking status: Former Smoker -- 0.25 packs/day for 31 years    Types: Cigarettes    Quit date: 04/26/2012    . Smokeless tobacco: Never Used  . Alcohol Use: No     Colonoscopy:  PAP:  Bone density:  Lipid panel:  No Known Allergies  Current Facility-Administered Medications  Medication Dose Route Frequency Provider Last Rate Last Dose  . 0.9 %  sodium chloride infusion   Intravenous Continuous Chauncey Cruel, MD      . cyclobenzaprine (FLEXERIL) tablet 10 mg  10 mg Oral TID PRN Chauncey Cruel, MD      . Derrill Memo ON 07/05/2013] dexamethasone (DECADRON) tablet 8 mg  8 mg Oral BID WC Chauncey Cruel, MD      . enoxaparin (LOVENOX) injection 40 mg  40 mg Subcutaneous Q24H Chauncey Cruel, MD      . Derrill Memo ON 07/05/2013] exemestane (AROMASIN) tablet 25 mg  25 mg Oral QPC breakfast Chauncey Cruel, MD      . fentaNYL ([place] - dosed mcg/hr) 100 mcg  100 mcg Transdermal  Q72H Chauncey Cruel, MD      . fluconazole (DIFLUCAN) tablet 100 mg  100 mg Oral Daily Chauncey Cruel, MD      . LORazepam (ATIVAN) tablet 1 mg  1 mg Oral Q6H PRN Chauncey Cruel, MD      . morphine (MSIR) tablet 15 mg  15 mg Oral Q6H PRN Chauncey Cruel, MD      . morphine 1 mg/mL in dextrose 5 % 100 mL infusion  1 mg/hr Intravenous Continuous Chauncey Cruel, MD      . morphine 2 MG/ML injection 2 mg  2 mg Intravenous Q1H PRN Chauncey Cruel, MD      . ondansetron (ZOFRAN-ODT) disintegrating tablet 4 mg  4 mg Oral Q4H PRN Chauncey Cruel, MD      . pantoprazole (PROTONIX) EC tablet 80 mg  80 mg Oral Daily Chauncey Cruel, MD      . polyethylene glycol (MIRALAX / GLYCOLAX) packet 17 g  17 g Oral Daily Chauncey Cruel, MD      . promethazine (PHENERGAN) tablet 25 mg  25 mg Oral Q6H PRN Chauncey Cruel, MD      . senna-docusate (Senokot-S) tablet 2-4 tablet  2-4 tablet Oral BID Chauncey Cruel, MD      . zolpidem (AMBIEN) tablet 5 mg  5 mg Oral QHS PRN Chauncey Cruel, MD        OBJECTIVE: Middle-aged [place] American woman in obvious pain There were no vitals filed for this visit.   There is no  weight on file to calculate BMI.    ECOG FS:3 - Symptomatic, >50% confined to bed    96.2 -  96.2  96.2 (35.7) 02/03 1531 Pulse          128 -  128  128 02/03 1531 Resp          18 -  18  18 02/03 1531 BP          130/    Ocular: Sclerae icteric Ear-nose-throat: Mild thrush Lungs no rales or rhonchi Heart regular rate and rhythm, no murmur appreciated Abd distended, terse, with pain in the right upper quadrant and lower areas, but no erythema and no shifting dullness Neuro: non-focal, well-oriented, tearful  affect Breasts: Deferred   LAB RESULTS:  CMP     Component Value Date/Time   NA 136 07/04/2013 1428   NA 133* 06/29/2013 2000   K 3.7 07/04/2013 1428   K 3.7 06/29/2013 2000   CL 93* 06/29/2013 2000   CL 98 10/31/2012 1427   CO2 20* 07/04/2013 1428   CO2 23 06/29/2013 2000   GLUCOSE 89 07/04/2013 1428   GLUCOSE 119* 06/29/2013 2000   GLUCOSE 106* 10/31/2012 1427   BUN 13.9 07/04/2013 1428   BUN 13 06/29/2013 2000   CREATININE 0.8 07/04/2013 1428   CREATININE 0.81 06/29/2013 2000   CALCIUM 11.2* 07/04/2013 1428   CALCIUM 10.8* 06/29/2013 2000   PROT 6.8 07/04/2013 1428   PROT 6.7 06/25/2013 1210   ALBUMIN 2.7* 07/04/2013 1428   ALBUMIN 2.5* 06/25/2013 1210   AST 88* 07/04/2013 1428   AST 178* 06/25/2013 1210   ALT 44 07/04/2013 1428   ALT 71* 06/25/2013 1210   ALKPHOS 1,083* 07/04/2013 1428   ALKPHOS 1175* 06/25/2013 1210   BILITOT 1.51* 07/04/2013 1428   BILITOT 0.9 06/25/2013 1210   GFRNONAA 85* 06/29/2013 2000   GFRAA >90 06/29/2013 2000    I  No results found for this basename: SPEP,  UPEP,   kappa and lambda light chains    Lab Results  Component Value Date   WBC 7.3 07/04/2013   NEUTROABS 5.7 07/04/2013   HGB 8.7* 07/04/2013   HCT 25.5* 07/04/2013   MCV 86.0 07/04/2013   PLT 388 07/04/2013    @LASTCHEMISTRY @  Lab Results  Component Value Date   LABCA2 89* 06/09/2012    No components found with this basename: OEVOJ500    No results found for this basename: INR,  in the last 168  hours  Urinalysis    Component Value Date/Time   COLORURINE AMBER* 06/29/2013 2122   APPEARANCEUR CLOUDY* 06/29/2013 2122   LABSPEC 1.011 06/29/2013 2122   LABSPEC 1.020 03/30/2011 0858   PHURINE 7.0 06/29/2013 2122   GLUCOSEU NEGATIVE 06/29/2013 2122   HGBUR NEGATIVE 06/29/2013 2122   BILIRUBINUR NEGATIVE 06/29/2013 2122   KETONESUR NEGATIVE 06/29/2013 2122   PROTEINUR NEGATIVE 06/29/2013 2122   UROBILINOGEN 2.0* 06/29/2013 2122   NITRITE NEGATIVE 06/29/2013 2122   LEUKOCYTESUR LARGE* 06/29/2013 2122    STUDIES: Dg Chest 2 View  06/29/2013   CLINICAL DATA:  Cough, weakness, history of breast cancer  EXAM: CHEST  2 VIEW  COMPARISON:  06/25/2013  FINDINGS: Prior right mastectomy and axillary lymph node dissection. Normal heart size and vascularity. Minor streaky basilar atelectasis. No edema, pneumonia, effusion pneumothorax. Trachea midline.  IMPRESSION: Postop changes.  Basilar atelectasis.   Electronically Signed   By: Daryll Brod M.D.   On: 06/29/2013 20:54   Ct Head Wo Contrast  06/16/2013   CLINICAL DATA:  Altered mental status. History of metastatic breast cancer.  EXAM: CT HEAD WITHOUT CONTRAST  TECHNIQUE: Contiguous axial images were obtained from the base of the skull through the vertex without intravenous contrast.  COMPARISON:  Brain MRI 12/08/2012 and head CT scan 06/15/2012.  FINDINGS: The metastatic deposit in the clivus seen on MRI is not visible on CT. No metastasis is identified. The brain appears normal without infarct, hemorrhage, mass lesion, mass effect, midline shift or abnormal extra-axial fluid collection there is no hydrocephalus or pneumocephalus.  IMPRESSION: No acute abnormality is identified. Metastatic deposit in the clivus seen on brain MRI is visible on this study. No evidence of metastatic disease is seen. If there is continued concern for metastatic disease, MRI of the brain with and without contrast is recommended.   Electronically Signed   By: Inge Rise M.D.    On: 06/16/2013 16:55   Dg Shoulder Left  06/25/2013   CLINICAL DATA:  Abdominal in shoulder pain  EXAM: LEFT SHOULDER - 2+ VIEW  COMPARISON:  02/02/2012  FINDINGS: No acute fracture.  No dislocation.  No destructive bone lesion.  IMPRESSION: No acute bony pathology.   Electronically Signed   By: Maryclare Bean M.D.   On: 06/25/2013 12:44   Dg Abd Acute W/chest  06/25/2013   CLINICAL DATA:  Abdominal and shoulder pain, no trauma  EXAM: ACUTE ABDOMEN SERIES (ABDOMEN 2 VIEW & CHEST 1 VIEW)  COMPARISON:  CT abdomen/ pelvis 08/04/2012.  FINDINGS: There is no evidence of dilated bowel loops or free intraperitoneal air. No radiopaque calculi or other significant radiographic abnormality is seen. Heart size and mediastinal contours are within normal limits. Both lungs are clear. Evidence of right mastectomy and right axillary dissection. Right total hip arthroplasty partly visualized. On the supine projection, there is relative gaslessness of the upper abdomen with possible inferior displacement of the colon likely related  to hepatomegaly as seen on prior exam.  IMPRESSION: No acute intra-abdominal or cardiopulmonary process.  Inferior displacement of loops of bowel compatible with previously seen hepatomegaly/masses.   Electronically Signed   By: Conchita Paris M.D.   On: 06/25/2013 12:49    ASSESSMENT: 48 y.o. [place] woman  1. 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  2. Status post adjuvant chemotherapy July to November 2011, consisting of 4 cycles of dose dense Doxorubicin and Cyclophosphamide, followed by 4 of 12 planned weekly doses of Paclitaxel given adjuvantly, discontinued secondary to peripheral neuropathy.  3. Status post radiation to the right chest wall, truncated due to problems with the patient missing  appointments.  4. Tamoxifen started June of 2012, but discontinued due to nausea.  5. Metastatic disease to liver and bone pathologically documented October of 2012, estrogen receptor 100% positive, progesterone receptor 7% positive, with no HER-2 amplification; treatment in the metastatic setting has consisted of:  6. Faslodex started 03/16/2011, discontinued November 2013 with progression. Zoladex every 3 months also started on 03/16/2011, and monthly Zoledronic acid started November 2011, the Zometa I currently held due to pending dental procedures and possible extractions.  7. Status post thoracic laminectomy, T9-T11, for impending cord compression on 03/21/2011, followed by irradiation to the spine completed November 2011  8. 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 s/p palliative radiation to the Right hip clivus to 30 Gy completed 04/07/2012  10. Progression noted by scans in November 2013, at which time Faslodex was discontinued. Patient was started on Exemestane in early December, with Everolimus started in mid December, since discontinued. Also received Zoladex injections every 3 months, last given 09/05/2012.  11. Nondisplaced right femoral neck fracture, status post right hip arthroplasty on 05/04/2012 under the care of [facility].  12. Hypercalcemia with elevated serum creatinine: resolved  13. S/p palliative radiation therapy for cord compression completed 08/15/2012  14. Poorly controlled pain  15. Under hospice care, out of facility DO NOT RESUSCITATE in place    PLAN: [name]'s situation is unfortunate. She appears to remain estranged from family. There is a question of drug diversion and and illegal drug use in her apartment. Overall she has continued to decline. I am admitting her for disease stabilization and for consideration of whether she should be back to  [facility] or back home. The plan is for hydration, pain control, and reassessment with hospices help.     Chauncey Cruel, MD   07/04/2013 5:40 PM

## 2013-07-05 ENCOUNTER — Encounter (HOSPITAL_COMMUNITY): Payer: Self-pay

## 2013-07-05 MED ORDER — ENSURE PUDDING PO PUDG
1.0000 | Freq: Three times a day (TID) | ORAL | Status: DC
  Administered 2013-07-05 – 2013-07-06 (×3): 1 via ORAL
  Filled 2013-07-05 (×6): qty 1

## 2013-07-05 MED ORDER — FENTANYL 100 MCG/HR TD PT72
100.0000 ug | MEDICATED_PATCH | TRANSDERMAL | Status: DC
  Administered 2013-07-05: 100 ug via TRANSDERMAL
  Filled 2013-07-05: qty 1

## 2013-07-05 MED ORDER — SODIUM CHLORIDE 0.9 % IV SOLN
90.0000 mg | Freq: Once | INTRAVENOUS | Status: AC
  Administered 2013-07-05: 90 mg via INTRAVENOUS
  Filled 2013-07-05: qty 10

## 2013-07-05 MED ORDER — BENZONATATE 100 MG PO CAPS
100.0000 mg | ORAL_CAPSULE | Freq: Two times a day (BID) | ORAL | Status: DC | PRN
  Administered 2013-07-06: 100 mg via ORAL
  Filled 2013-07-05 (×2): qty 1

## 2013-07-05 MED ORDER — DIPHENHYDRAMINE HCL 25 MG PO CAPS
25.0000 mg | ORAL_CAPSULE | Freq: Four times a day (QID) | ORAL | Status: DC | PRN
  Administered 2013-07-05 – 2013-07-06 (×2): 25 mg via ORAL
  Filled 2013-07-05 (×2): qty 1

## 2013-07-05 NOTE — Progress Notes (Signed)
Inpatient [facility] Rm 1301 [facility] RN Visit- M. Wynetta Emery, RN  Related admission to [facility] diagnosis of Breast Cancer;  Pt is DNR code status   Pt seen at bedside no family alert & oriented, lying in bed, stated "I wasn't planning on being in the hospital but I feel much better than when I came in' when asked about level of pain pt replied 'what pain'    Currently pt on 42m/hr continuous Morphine drip; has Fentanyl patch 100 mcg in place- will await direction from Dr MJana Hakimas to transition for home medications Pt stated she spoke with Dr MJana Hakimthis morning and she may discharge back to home tomorrow; [facility] and is aware [facility] will co-ordinate with Dr MJana Hakimregarding her care needs;  Pt became tearful during visit and shared that she just wants to go home and be comfortable;  -pt also shared concerns that she may have lost her ATM card and is anxious that someone may be using this and she could be facing financial issues-she talked about needing to get home to look through her things to sort this question out; wProbation officeroffered listening presence and encouraged pt to focus on current issues/feeling better and getting to the point where she would be able to get home safely and address these concerns; she talked about assistance of [facility] team at home to help with scheduling things and 'helping me to keep things straight'; pt stated 'I want to do what I can'  Patient's home medication list, OOF DNR and transfer summary  is on shadow chart.   Please call [facility] @ [phone] ask for RN Liaison or after hours,ask for on-call RN with any hospice needs.   Thank you.  MDanton Sewer RN  [facility]  ([phone]

## 2013-07-05 NOTE — Progress Notes (Addendum)
Told over telephone to d/c morphine drip. Wasted it with [employer]. 72.60mL was wasted.

## 2013-07-05 NOTE — Progress Notes (Addendum)
INITIAL NUTRITION ASSESSMENT  DOCUMENTATION CODES Per approved criteria  -Severe malnutrition in the context of chronic illness -Underweight  Pt meets criteria for severe MALNUTRITION in the context of chronic illness as evidenced by severe wasting of muscle mass and subcutaneous fat loss, 21% weight loss in 6 month period, and PO intake <75% est nutrition needs for > one month .   INTERVENTION: -Recommend Ensure Pudding po TID, each supplement provides 170 kcal and 4 grams of protein -Consider addition of appetite stimulant for quality of life -Provided examples high calorie/protein snacks for weight gain -Continue with liberalized diet to encourage PO intake  NUTRITION DIAGNOSIS: Inadequate oral intake related to no appetite/taste changes as evidenced by PO intake <75%, unintentional wt loss.   Goal: Pt to meet >/= 90% of their estimated nutrition needs    Monitor:  Total protein/energy intake, labs, weights, medication  Reason for Assessment: MST/Underweight BMI  48 y.o. female  Admitting Dx: <principal problem not specified>  ASSESSMENT: [name] has missed multiple appointments in our office. She is currently under hospice but lives alone at home. There have been issues relating to possible drug I version and the legal drug use in her apartment. She has received a letter from hospice letting her know that unless these problems are taking care of they will have to discontinue their support. Today the patient came for a scheduled appointment, since her medications had run out. She was in obvious pain, very dehydrated and weak, and we are admitting her for hydration and stabilization. The question is whether she should go back call or return to be can place where she previously had stated for a few weeks   -Pt with overall decreased appetite, reported that she has lost all desire to eat and food no longer has any taste -Diet recall indicates one meal/day, usually consisting of  cereal -Was on Ensure supplements, but stopped consuming them as she grew tired of the taste -Was willing to try other supplements, however pt disliked taste of MagicCup, Boost, and Carnation Instant Breakfast after given samples of each to try -Was willing to try Ensure pudding as alternative -Reviewed potential high calorie/protein snack options, pt declined during admit but appreciated information -Was interested in trying medication to stimulate appetite. Has tried different products she has found on the Internet, but has shown no positive effect -Pt unable to maintain weight. Has dropped 25 lbs in 6 months. Usual weight around 120 lbs. Was unable to determine exactly when weight loss/decreased appetite began  Nutrition Focused Physical Exam:  Subcutaneous Fat:  Orbital Region: severe Upper Arm Region: severe Thoracic and Lumbar Region: severe  Muscle:  Temple Region: severe Clavicle Bone Region: severe Clavicle and Acromion Bone Region: severe Scapular Bone Region: severe Dorsal Hand: severe Patellar Region: severe Anterior Thigh Region: severe Posterior Calf Region: severe  Edema: N/A     Height: Ht Readings from Last 1 Encounters:  07/04/13 5\' 2"  (1.575 m)    Weight: Wt Readings from Last 1 Encounters:  07/04/13 96 lb (43.545 kg)    Ideal Body Weight: 110 lbs  % Ideal Body Weight: 87%  Wt Readings from Last 10 Encounters:  07/04/13 96 lb (43.545 kg)  07/04/13 96 lb 3.2 oz (43.636 kg)  06/29/13 101 lb (45.813 kg)  01/26/13 121 lb 6.4 oz (55.067 kg)  01/22/13 124 lb (56.246 kg)  01/11/13 129 lb 3.2 oz (58.605 kg)  01/05/13 131 lb 3.2 oz (59.512 kg)  01/05/13 131 lb 3.2 oz (59.512 kg)  12/14/12 119 lb 14.4 oz (54.386 kg)  11/07/12 105 lb 3.2 oz (47.718 kg)    Usual Body Weight: 120 lbs  % Usual Body Weight:  80%  BMI:  Body mass index is 17.55 kg/(m^2). Underweight  Estimated Nutritional Needs: Kcal: 1300-1500  Protein: 70-80 gram Fluid: 1500  ml/daily  Skin: WDL  Diet Order: General  EDUCATION NEEDS: -Education needs addressed   Intake/Output Summary (Last 24 hours) at 07/05/13 1211 Last data filed at 07/05/13 1036  Gross per 24 hour  Intake    326 ml  Output    600 ml  Net   -274 ml    Last BM: 2/02   Labs:   Recent Labs Lab 06/29/13 2000 07/04/13 1428 07/04/13 1815  NA 133* 136  --   K 3.7 3.7  --   CL 93*  --   --   CO2 23 20*  --   BUN 13 13.9  --   CREATININE 0.81 0.8 0.88  CALCIUM 10.8* 11.2*  --   GLUCOSE 119* 89  --     CBG (last 3)  No results found for this basename: GLUCAP,  in the last 72 hours  Scheduled Meds: . dexamethasone  8 mg Oral BID WC  . enoxaparin (LOVENOX) injection  40 mg Subcutaneous Q24H  . exemestane  25 mg Oral QPC breakfast  . feeding supplement (ENSURE)  1 Container Oral TID WC  . fentaNYL  100 mcg Transdermal Q72H  . fluconazole  100 mg Oral Daily  . pamidronate  90 mg Intravenous Once  . pantoprazole  80 mg Oral Daily  . polyethylene glycol  17 g Oral Daily  . senna-docusate  2-4 tablet Oral BID    Continuous Infusions: . sodium chloride 100 mL/hr at 07/05/13 0537  . morphine 1 mg/hr (07/04/13 2113)    Past Medical History  Diagnosis Date  . Hypertension   . GERD (gastroesophageal reflux disease)   . Pyelonephritis 10/01/11  . Recurrent UTI (urinary tract infection) 10/01/11  . S/P chemotherapy, time since 4-12 weeks   . S/P radiation > 12 weeks 03/15/12- 04/07/12    right hip, clivus  . Hypercalcemia 06/21/2012  . History of radiation therapy 08/06/10 -09/16/10    R supraclav fossa, R chest wall, R drain site  . radiaiton therapy 07/22/12 -08/15/12    T4-T7  . 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 Brain cancer     spread from breast ca  . Hospice care patient 11/09/2012    [facility] [place]-call [phone]    Past Surgical History  Procedure Laterality  Date  . Cesarean section  1989; 2010  . Mastectomy  2011    right  . Back surgery  2012    "removed tumor,cancer, from my spine  . Port-a-cath removal  2012    right chest  . Portacath placement  2011    right  . Tubal ligation  2010  . Breast biopsy  2011    right  . Hip arthroplasty  05/04/2012    Procedure: ARTHROPLASTY BIPOLAR HIP;  Surgeon: Mauri Pole, MD;  Location: [facility];  Service: Orthopedics;  Laterality: Right;  RIGHT HIP HEMI ARTHROPLASTY      Atlee Abide [place] RD LDN Clinical Dietitian [phone]

## 2013-07-05 NOTE — Progress Notes (Signed)
Admitted from clinic

## 2013-07-05 NOTE — Progress Notes (Signed)
[facility] note LCSW made supportive  Visit to assess needs and coping. Patient was asleep upon arrival, yet awakened and we talked at length about her recent struggles and role of [facility]. Patient is to return home and her daughter, SO live there as well. Patient shared a desire to manage her affairs better and do things which are positive. She has no ID, ATM card and fears that they are lost, she and her daughter will manage. LCSW did give her names of dentist, PCP, and [facility] to contact for follow-up as she voiced desire to make her appointments.  Patient remains scattered with thoughts yet was more clear today and hopeful about her future. LCSW has concerns about care needs, ongoing compliance and safety yet patient has no desires to change her living arrangements. Hospice to continue to support and follow patient as long as she abides by agreement, which she remains aware is in place.  Katherina Right, [place]

## 2013-07-05 NOTE — Progress Notes (Signed)
CSW received referral that pt from home with hospice and MD wanted hospice to be notified that plan is for pt to return home on 2/5 and resume home hospice care.  CSW notified RNCM who will communicate with home hospice regarding disposition planning.  Inappropriate CSW referral. No social work needs identified.  Please re-consult if social work needs arise.  CSW signing off.   Alison Murray, MSW, [place] Work [phone]

## 2013-07-05 NOTE — Progress Notes (Signed)
[name]   DOB:1966/01/23   VH#:[id number]   WUX#:[id number]  Subjective: feeling a little stronger today; discussed home situation w da, da's boyfriend, and 2 children in house; discussed relations with hospice; no BM past 24 h; pain better controlled; no N/V, not getting OOB yet. No family in room   Objective: middle aged AA woman examined in bed Filed Vitals:   07/05/13 0600  BP: 118/69  Pulse: 99  Temp: 98.1 F (36.7 C)  Resp: 20    Body mass index is 17.55 kg/(m^2).  Intake/Output Summary (Last 24 hours) at 07/05/13 0749 Last data filed at 07/05/13 0537  Gross per 24 hour  Intake    326 ml  Output    400 ml  Net    -74 ml     Oropharynx slightly dry  No peripheral adenopathy  Lungs clear -- no rales or rhonchi  Heart regular rate and rhythm  Abdomen tender RUQ, lower abd  MSK no focal spinal tenderness, no peripheral edema  Neuro nonfocal   CBG (last 3)  No results found for this basename: GLUCAP,  in the last 72 hours   Labs:  Lab Results  Component Value Date   WBC 6.4 07/04/2013   HGB 7.9* 07/04/2013   HCT 23.5* 07/04/2013   MCV 84.8 07/04/2013   PLT 355 07/04/2013   NEUTROABS 5.7 07/04/2013    @LASTCHEMISTRY @  Urine Studies No results found for this basename: UACOL, UAPR, USPG, UPH, UTP, UGL, UKET, UBIL, UHGB, UNIT, UROB, ULEU, UEPI, UWBC, URBC, UBAC, CAST, CRYS, UCOM, BILUA,  in the last 72 hours  Basic Metabolic Panel:  Recent Labs Lab 06/29/13 2000 07/04/13 1428 07/04/13 1815  NA 133* 136  --   K 3.7 3.7  --   CL 93*  --   --   CO2 23 20*  --   GLUCOSE 119* 89  --   BUN 13 13.9  --   CREATININE 0.81 0.8 0.88  CALCIUM 10.8* 11.2*  --    GFR Estimated Creatinine Clearance: 53.7 ml/min (by C-G formula based on Cr of 0.88). Liver Function Tests:  Recent Labs Lab 07/04/13 1428  AST 88*  ALT 44  ALKPHOS 1,083*  BILITOT 1.51*  PROT 6.8  ALBUMIN 2.7*   No results found for this basename: LIPASE, AMYLASE,  in the last 168 hours No results  found for this basename: AMMONIA,  in the last 168 hours Coagulation profile No results found for this basename: INR, PROTIME,  in the last 168 hours  CBC:  Recent Labs Lab 06/29/13 2000 07/04/13 1424 07/04/13 1815  WBC 6.8 7.3 6.4  NEUTROABS 4.9 5.7  --   HGB 8.8* 8.7* 7.9*  HCT 26.6* 25.5* 23.5*  MCV 86.4 86.0 84.8  PLT 343 388 355   Cardiac Enzymes: No results found for this basename: CKTOTAL, CKMB, CKMBINDEX, TROPONINI,  in the last 168 hours BNP: No components found with this basename: POCBNP,  CBG: No results found for this basename: GLUCAP,  in the last 168 hours D-Dimer No results found for this basename: DDIMER,  in the last 72 hours Hgb A1c No results found for this basename: HGBA1C,  in the last 72 hours Lipid Profile No results found for this basename: CHOL, HDL, LDLCALC, TRIG, CHOLHDL, LDLDIRECT,  in the last 72 hours Thyroid function studies No results found for this basename: TSH, T4TOTAL, FREET3, T3FREE, THYROIDAB,  in the last 72 hours Anemia work up No results found for this basename: VITAMINB12,  FOLATE, FERRITIN, TIBC, IRON, RETICCTPCT,  in the last 72 hours Microbiology Recent Results (from the past 240 hour(s))  URINE CULTURE     Status: None   Collection Time    06/29/13  9:22 PM      Result Value Range Status   Specimen Description URINE, CLEAN CATCH   Final   Special Requests NONE   Final   Culture  Setup Time     Final   Value: 06/30/2013 01:15     Performed at [place]     Final   Value: NO GROWTH     Performed at [facility]   Culture     Final   Value: NO GROWTH     Performed at [facility]   Report Status 06/30/2013 FINAL   Final      Studies:  No results found.    Assessment: 48 y.o. [place] woman  1. 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  2. Status post adjuvant chemotherapy July to November 2011, consisting of 4 cycles of dose dense Doxorubicin and Cyclophosphamide, followed by 4 of 12 planned weekly doses of Paclitaxel given adjuvantly, discontinued secondary to peripheral neuropathy.  3. Status post radiation to the right chest wall, truncated due to problems with the patient missing appointments.  4. Tamoxifen started June of 2012, but discontinued due to nausea.  5. Metastatic disease to liver and bone pathologically documented October of 2012, estrogen receptor 100% positive, progesterone receptor 7% positive, with no HER-2 amplification; treatment in the metastatic setting has consisted of:  6. Faslodex started 03/16/2011, discontinued November 2013 with progression. Zoladex every 3 months also started on 03/16/2011, and monthly Zoledronic acid started November 2011, the Zometa I currently held due to pending dental procedures and possible extractions.  7. Status post thoracic laminectomy, T9-T11, for impending cord compression on 03/21/2011, followed by irradiation to the spine completed November 2011  8. 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 s/p palliative radiation to the Right hip clivus to 30 Gy completed 04/07/2012  10. Progression noted by scans in November 2013, at which time Faslodex was discontinued. Patient was started on Exemestane in early December, with Everolimus started in mid December, since discontinued. Also received Zoladex injections every 3 months, last given 09/05/2012.  11. Nondisplaced right femoral neck fracture, status post right hip arthroplasty on 05/04/2012 under the care of [facility].  12. Hypercalcemia with elevated serum creatinine: resolved  13. S/p palliative radiation therapy for cord compression completed 08/15/2012  14.  Poorly controlled pain  15. Under hospice care, out of facility DO NOT RESUSCITATE in place    Plan: [name] is looking better today. I have discussed issues relating to Hospice follow up and explained they cannot replace "lost" narcotics. She agrees verbally. I am going to give her pamidronate today for her hypercalcemia. Anticipate d/c to home tomorrow AM-- will coordinate with hospice   Chauncey Cruel, MD 07/05/2013  7:49 AM

## 2013-07-05 NOTE — Care Management Note (Signed)
Cm spoke with patient at the bedside concerning CM consult to arrange pt discharge with Home Hospice on 07/06/13. Per pt active with [facility]. Cm notified [place] home hospice liaison Danton Sewer of anticipated discharge. Pt states adult daughter to provide transportation home and assist in home care. No DME needs identified.     Venita Lick Yoneko Talerico,MSN,RN [phone]

## 2013-07-06 ENCOUNTER — Other Ambulatory Visit: Payer: Self-pay | Admitting: *Deleted

## 2013-07-06 ENCOUNTER — Other Ambulatory Visit: Payer: Self-pay | Admitting: Oncology

## 2013-07-06 DIAGNOSIS — G893 Neoplasm related pain (acute) (chronic): Principal | ICD-10-CM

## 2013-07-06 DIAGNOSIS — K5900 Constipation, unspecified: Secondary | ICD-10-CM

## 2013-07-06 MED ORDER — DIPHENHYDRAMINE HCL 25 MG PO CAPS: 25.0000 mg | ORAL_CAPSULE | Freq: Four times a day (QID) | ORAL | Status: AC | PRN

## 2013-07-06 MED ORDER — MORPHINE SULFATE 15 MG PO TABS: 15.0000 mg | ORAL_TABLET | Freq: Four times a day (QID) | ORAL | Status: DC | PRN

## 2013-07-06 MED ORDER — CAMPHOR-MENTHOL 0.5-0.5 % EX LOTN
TOPICAL_LOTION | CUTANEOUS | Status: DC | PRN
  Filled 2013-07-06: qty 222

## 2013-07-06 NOTE — Discharge Summary (Signed)
ADDENDUM:  Add "severe malnutrition" to problem list: Pt meets criteria for severe MALNUTRITION in the context of chronic illness as evidenced by severe wasting of muscle mass and subcutaneous fat loss, 21% weight loss in 6 month period, and PO intake <75% est nutrition needs for > one month .

## 2013-07-06 NOTE — Discharge Summary (Signed)
Physician Discharge Summary  Patient ID: [name] MRN: [id number] DOB/AGE: 49-Oct-1967 49 y.o.  Admit date: 07/04/2013 Discharge date: 07/06/2013  Primary Care Physician:  Pcp Not In System   Discharge Diagnoses:  Stage 4 breast cancer, dehydration, poorly controlled pain, constipation due to narcotics  Present on Admission:  . Breast cancer  Discharge Medications:    Medication List    ASK your doctor about these medications       benzonatate 100 MG capsule  Commonly known as:  TESSALON  Take 1 capsule (100 mg total) by mouth every 8 (eight) hours.     cyclobenzaprine 10 MG tablet  Commonly known as:  FLEXERIL  Take 1 tablet (10 mg total) by mouth 3 (three) times daily as needed for muscle spasms.     dexamethasone 4 MG tablet  Commonly known as:  DECADRON  Take 8 mg by mouth 2 (two) times daily with a meal.     exemestane 25 MG tablet  Commonly known as:  AROMASIN  Take 1 tablet (25 mg total) by mouth daily after breakfast.     fentaNYL 100 MCG/HR  Commonly known as:  DURAGESIC - dosed mcg/hr  Place 1 patch (100 mcg total) onto the skin every 3 (three) days.     fluconazole 100 MG tablet  Commonly known as:  DIFLUCAN  Take 100 mg by mouth daily.     guaiFENesin 100 MG/5ML Soln  Commonly known as:  ROBITUSSIN  Take 5 mLs (100 mg total) by mouth every 4 (four) hours as needed for cough or to loosen phlegm.     lactulose 10 G packet  Commonly known as:  CEPHULAC  Take 1 packet (10 g total) by mouth 3 (three) times daily.     levofloxacin 500 MG tablet  Commonly known as:  LEVAQUIN  Take 1 tablet (500 mg total) by mouth daily.     LORazepam 1 MG tablet  Commonly known as:  ATIVAN  Take 1 tablet (1 mg total) by mouth every 6 (six) hours as needed for anxiety.     morphine 15 MG tablet  Commonly known as:  MSIR  Take 1 tablet (15 mg total) by mouth every 6 (six) hours as needed for severe pain. May take 1-2 tabs po q 4 hours as needed      omeprazole 40 MG capsule  Commonly known as:  PRILOSEC  Take 1 capsule (40 mg total) by mouth daily.     ondansetron 4 MG disintegrating tablet  Commonly known as:  ZOFRAN-ODT  Take 4 mg by mouth every 4 (four) hours as needed for nausea or vomiting.     polyethylene glycol packet  Commonly known as:  MIRALAX / GLYCOLAX  Take 17 g by mouth daily.     promethazine 25 MG tablet  Commonly known as:  PHENERGAN  Take 1 tablet (25 mg total) by mouth every 6 (six) hours as needed for nausea. [facility] [place] [facility]     senna-docusate 8.6-50 MG per tablet  Commonly known as:  Senokot-S  Take 2-4 tablets by mouth 2 (two) times daily.     triamterene-hydrochlorothiazide 37.5-25 MG per capsule  Commonly known as:  DYAZIDE  Take 1 each (1 capsule total) by mouth every morning.     zolpidem 5 MG tablet  Commonly known as:  AMBIEN  Take 1 tablet (5 mg total) by mouth at bedtime as needed for sleep.         Disposition  and Follow-up: to home under Hospice supervision; to office weekly for IVF   Significant Diagnostic Studies:  Dg Chest 2 View  06/29/2013   CLINICAL DATA:  Cough, weakness, history of breast cancer  EXAM: CHEST  2 VIEW  COMPARISON:  06/25/2013  FINDINGS: Prior right mastectomy and axillary lymph node dissection. Normal heart size and vascularity. Minor streaky basilar atelectasis. No edema, pneumonia, effusion pneumothorax. Trachea midline.  IMPRESSION: Postop changes.  Basilar atelectasis.   Electronically Signed   By: Daryll Brod M.D.   On: 06/29/2013 20:54   Ct Head Wo Contrast  06/16/2013   CLINICAL DATA:  Altered mental status. History of metastatic breast cancer.  EXAM: CT HEAD WITHOUT CONTRAST  TECHNIQUE: Contiguous axial images were obtained from the base of the skull through the vertex without intravenous contrast.  COMPARISON:  Brain MRI 12/08/2012 and head CT scan 06/15/2012.  FINDINGS: The metastatic deposit in the clivus seen on MRI is not visible on CT. No  metastasis is identified. The brain appears normal without infarct, hemorrhage, mass lesion, mass effect, midline shift or abnormal extra-axial fluid collection there is no hydrocephalus or pneumocephalus.  IMPRESSION: No acute abnormality is identified. Metastatic deposit in the clivus seen on brain MRI is visible on this study. No evidence of metastatic disease is seen. If there is continued concern for metastatic disease, MRI of the brain with and without contrast is recommended.   Electronically Signed   By: Inge Rise M.D.   On: 06/16/2013 16:55   Dg Shoulder Left  06/25/2013   CLINICAL DATA:  Abdominal in shoulder pain  EXAM: LEFT SHOULDER - 2+ VIEW  COMPARISON:  02/02/2012  FINDINGS: No acute fracture.  No dislocation.  No destructive bone lesion.  IMPRESSION: No acute bony pathology.   Electronically Signed   By: Maryclare Bean M.D.   On: 06/25/2013 12:44   Dg Abd Acute W/chest  06/25/2013   CLINICAL DATA:  Abdominal and shoulder pain, no trauma  EXAM: ACUTE ABDOMEN SERIES (ABDOMEN 2 VIEW & CHEST 1 VIEW)  COMPARISON:  CT abdomen/ pelvis 08/04/2012.  FINDINGS: There is no evidence of dilated bowel loops or free intraperitoneal air. No radiopaque calculi or other significant radiographic abnormality is seen. Heart size and mediastinal contours are within normal limits. Both lungs are clear. Evidence of right mastectomy and right axillary dissection. Right total hip arthroplasty partly visualized. On the supine projection, there is relative gaslessness of the upper abdomen with possible inferior displacement of the colon likely related to hepatomegaly as seen on prior exam.  IMPRESSION: No acute intra-abdominal or cardiopulmonary process.  Inferior displacement of loops of bowel compatible with previously seen hepatomegaly/masses.   Electronically Signed   By: Conchita Paris M.D.   On: 06/25/2013 12:49    Discharge Laboratory Values: Basic Metabolic Panel:  Recent Labs Lab 06/29/13 2000  07/04/13 1428 07/04/13 1815  NA 133* 136  --   K 3.7 3.7  --   CL 93*  --   --   CO2 23 20*  --   GLUCOSE 119* 89  --   BUN 13 13.9  --   CREATININE 0.81 0.8 0.88  CALCIUM 10.8* 11.2*  --    GFR Estimated Creatinine Clearance: 53.7 ml/min (by C-G formula based on Cr of 0.88). Liver Function Tests:  Recent Labs Lab 07/04/13 1428  AST 88*  ALT 44  ALKPHOS 1,083*  BILITOT 1.51*  PROT 6.8  ALBUMIN 2.7*   No results found for this basename:  LIPASE, AMYLASE,  in the last 168 hours No results found for this basename: AMMONIA,  in the last 168 hours Coagulation profile No results found for this basename: INR, PROTIME,  in the last 168 hours  CBC:  Recent Labs Lab 06/29/13 2000 07/04/13 1424 07/04/13 1815  WBC 6.8 7.3 6.4  NEUTROABS 4.9 5.7  --   HGB 8.8* 8.7* 7.9*  HCT 26.6* 25.5* 23.5*  MCV 86.4 86.0 84.8  PLT 343 388 355   Cardiac Enzymes: No results found for this basename: CKTOTAL, CKMB, CKMBINDEX, TROPONINI,  in the last 168 hours BNP: No components found with this basename: POCBNP,  CBG: No results found for this basename: GLUCAP,  in the last 168 hours D-Dimer No results found for this basename: DDIMER,  in the last 72 hours Hgb A1c No results found for this basename: HGBA1C,  in the last 72 hours Lipid Profile No results found for this basename: CHOL, HDL, LDLCALC, TRIG, CHOLHDL, LDLDIRECT,  in the last 72 hours Thyroid function studies No results found for this basename: TSH, T4TOTAL, FREET3, T3FREE, THYROIDAB,  in the last 72 hours Anemia work up No results found for this basename: VITAMINB12, FOLATE, FERRITIN, TIBC, IRON, RETICCTPCT,  in the last 72 hours Microbiology Recent Results (from the past 240 hour(s))  URINE CULTURE     Status: None   Collection Time    06/29/13  9:22 PM      Result Value Range Status   Specimen Description URINE, CLEAN CATCH   Final   Special Requests NONE   Final   Culture  Setup Time     Final   Value: 06/30/2013  01:15     Performed at [facility]     Final   Value: NO GROWTH     Performed at [facility]   Culture     Final   Value: NO GROWTH     Performed at [facility]   Report Status 06/30/2013 FINAL   Final     Brief H and P: For complete details please refer to admission H and P, but in brief, the patient was admitted from the office where she presented with poorly controlled pain, dehydration, and constipation. See hospital course for further details  Physical Exam at Discharge:  BP 125/74  Pulse 112  Temp(Src) 97.9 F (36.6 C) (Oral)  Resp 16  Ht $R'5\' 2"'JC$  (1.575 m)  Wt 96 lb (43.545 kg)  BMI 17.55 kg/m2  SpO2 100% Gen: middle aged [place] American woman examined in bed Cardiovascular:*RRR, tachycardia resolved Respiratory:no rales or rhonchi Gastrointestinal:tenderness RUQ and lower abdomen with distension, unchanged Extremities:no edema    Hospital Course:  Active Problems:   Breast cancer 48 y.o. [place] woman  1. 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  2. Status post adjuvant chemotherapy July to November 2011, consisting of 4 cycles of dose dense Doxorubicin and Cyclophosphamide, followed by 4 of 12 planned weekly doses of Paclitaxel given adjuvantly, discontinued secondary to peripheral neuropathy.  3. Status post radiation to the right chest wall, truncated due to problems with the patient missing appointments.  4. Tamoxifen started June of 2012, but discontinued due to nausea.  5. Metastatic disease to liver and bone pathologically documented October of 2012, estrogen receptor 100% positive, progesterone receptor 7% positive,  with no HER-2 amplification; treatment in the metastatic setting has consisted of:  6. Faslodex started 03/16/2011,  discontinued November 2013 with progression. Zoladex every 3 months also started on 03/16/2011, and monthly Zoledronic acid started November 2011, the Zometa I currently held due to pending dental procedures and possible extractions.  7. Status post thoracic laminectomy, T9-T11, for impending cord compression on 03/21/2011, followed by irradiation to the spine completed November 2011  8. 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 s/p palliative radiation to the Right hip clivus to 30 Gy completed 04/07/2012  10. Progression noted by scans in November 2013, at which time Faslodex was discontinued. Patient was started on Exemestane in early December, with Everolimus started in mid December, since discontinued. Also received Zoladex injections every 3 months, last given 09/05/2012.  11. Nondisplaced right femoral neck fracture, status post right hip arthroplasty on 05/04/2012 under the care of [facility].  12. Hypercalcemia with elevated serum creatinine: resolved  13. S/p palliative radiation therapy for cord compression completed 08/15/2012  14. Poorly controlled pain  15. Under hospice care, out of facility DO NOT RESUSCITATE in place   The patient was admitted with dehydration and poorly controlled pain. She was hydrated aggressively and gradually improved. She was started on a morphine drip at 1 mg/ hr with significant improvement in pain control. Bowel prophylaxis was in place. After 24+ hours the drip was discontinued and patient 's pain controlled on oral and TD agents as at home. Pain was reasonably well controlled on this regimen. Bowel movements daily, initially hard, today normal consistency.   -- Overall, our feeling is that if patient can hydrate herself she will feel and do much better. We are accordingly setting her up for IVF weekly at our office. She was strongly encouraged  to take her painmedicine axactly as prescribed, and advised that lost or misplaced drugs will not be replaced by Hospice or by [place] until the time when they were due to be renewed. -- patient's home situation appears somewhat chaotic by her report. She is benefitting greatly from Hospice visits and oversight.  Diet:  regular  Activity:  As tolerated  Condition at Discharge:   improved  Signed: Dr. Lurline Del [phone]  07/06/2013, 7:39 AM

## 2013-07-06 NOTE — Progress Notes (Signed)
Inpatient [facility] Rm 1301 [facility] [place] RN Visit- M. Wynetta Emery, RN  Related admission to [facility] diagnosis of Breast Cancer; Pt is DNR code status  Pt seen at bedside no family, alert & oriented,dressed with coat on, states she feels fine, denies any discomfort at this time; pt stated she saw Dr Jana Hakim this morning she is planning on going home, her daughter will pick her up;  -staff RN Josph Macho at bedside to review discharge instructions, pt has questions about script to be sent to [facility] Discharge paperwork Prescription for Morphine 15 mg tablet was to be faxed to [facility]- Staff RN Josph Macho is confirming with Dr Delight Stare office Patient's home medication list, [facility] DNR and transfer summary is on shadow chart. Pt aware [facility] will follow up after discharge and she has [place] on-call patient number for any concerns Please call [facility] @ [phone]- ask for RN Liaison or after hours,ask for on-call RN with any hospice needs.  Thank you Danton Sewer, RN

## 2013-07-06 NOTE — Clinical Documentation Improvement (Signed)
THIS DOCUMENT IS NOT A PERMANENT PART OF THE MEDICAL RECORD  Please update your documentation with the medical record to reflect your response to this query. If you need help knowing how to do this please call [phone].  07/06/13   Dear Dr. Jana Hakim / Associates,  In a better effort to capture your patient's severity of illness, reflect appropriate length of stay and utilization of resources, a review of the patient medical record has revealed the following indicators.    Based on your clinical judgment, please clarify and document in a progress note and/or discharge summary the clinical condition associated with the following supporting information:  In responding to this query please exercise your independent judgment.  The fact that a query is asked, does not imply that any particular answer is desired or expected.  INITIAL NUTRITION ASSESSMENT 07/05/2013   Possible Clinical Conditions? _______Severe Malnutrition   _______Protein Calorie Malnutrition _______Severe Protein Calorie Malnutrition _______Other Condition________________ _______Cannot clinically determine     Supporting Information:Pt meets criteria for severe MALNUTRITION in the context of chronic illness as evidenced by severe wasting of muscle mass and subcutaneous fat loss, 21% weight loss in 6 month period, and PO intake <75% est nutrition needs for > one month .  Risk Factors:Metastatic disease to liver and bone, Status post right mastectomy and axillary lymph node dissection  Signs & Symptoms: Ht: 5\' 2"    Wt:96 lb (43.545 kg)  BMI: 17.55 kg 07/04/13 LAB: PROT 6.8   ALBUMIN 2.7*    Weight  Loss :Inadequate oral intake related to no appetite/taste changes as evidenced by PO intake <75%, unintentional wt loss. Subcutaneous Fat:  Orbital Region: severe  Upper Arm Region: severe  Thoracic and Lumbar Region: severe  Muscle:  Temple Region: severe  Clavicle Bone Region: severe  Clavicle and Acromion Bone  Region: severe  Scapular Bone Region: severe  Dorsal Hand: severe  Patellar Region: severe  Anterior Thigh Region: severe  Posterior Calf Region: severe  Edema: N/A   INTERVENTION:  -Recommend Ensure Pudding po TID, each supplement provides 170 kcal and 4 grams of protein  -Consider addition of appetite stimulant for quality of life  -Provided examples high calorie/protein snacks for weight gain  -Continue with liberalized diet to encourage PO intake     Medications: Protonix EC tab 80mg  daily    You may use possible, probable, or suspect with inpatient documentation. possible, probable, suspected diagnoses MUST be documented at the time of discharge  Reviewed: additional documentation in the medical record  Thank You,  [place] Documentation Specialist: [place]

## 2013-07-06 NOTE — Discharge Planning (Signed)
Pt discharged with needs for meds ordered and MD appointment to follow up. MD aware and will address these issues

## 2013-07-07 ENCOUNTER — Other Ambulatory Visit: Payer: Self-pay | Admitting: *Deleted

## 2013-07-07 MED ORDER — FENTANYL 100 MCG/HR TD PT72: 100.0000 ug | MEDICATED_PATCH | TRANSDERMAL | Status: DC

## 2013-07-07 NOTE — Telephone Encounter (Signed)
Lm gv appt for 09/06/13 @ 1:15pm. Made teh pt aware that i gv the orders for her IVF to [facility] and she will schedule those appts. Also made the pt aware that i will mail a letter/avs...td

## 2013-07-13 ENCOUNTER — Other Ambulatory Visit: Payer: Self-pay | Admitting: *Deleted

## 2013-07-13 ENCOUNTER — Ambulatory Visit

## 2013-07-17 ENCOUNTER — Telehealth: Payer: Self-pay | Admitting: *Deleted

## 2013-07-17 DIAGNOSIS — C50919 Malignant neoplasm of unspecified site of unspecified female breast: Secondary | ICD-10-CM

## 2013-07-17 DIAGNOSIS — C7952 Secondary malignant neoplasm of bone marrow: Principal | ICD-10-CM

## 2013-07-17 DIAGNOSIS — C7951 Secondary malignant neoplasm of bone: Secondary | ICD-10-CM

## 2013-07-17 MED ORDER — MORPHINE SULFATE 15 MG PO TABS: ORAL_TABLET | ORAL | Status: DC

## 2013-07-17 MED ORDER — PROMETHAZINE HCL 25 MG PO TABS: 25.0000 mg | ORAL_TABLET | Freq: Four times a day (QID) | ORAL | Status: DC | PRN

## 2013-07-17 MED ORDER — CYCLOBENZAPRINE HCL 10 MG PO TABS: 10.0000 mg | ORAL_TABLET | Freq: Three times a day (TID) | ORAL | Status: DC | PRN

## 2013-07-17 MED ORDER — LORAZEPAM 1 MG PO TABS
1.0000 mg | ORAL_TABLET | Freq: Four times a day (QID) | ORAL | Status: AC | PRN
Start: 2013-07-17 — End: ?

## 2013-07-17 MED ORDER — FENTANYL 100 MCG/HR TD PT72: 100.0000 ug | MEDICATED_PATCH | TRANSDERMAL | Status: DC

## 2013-07-17 NOTE — Telephone Encounter (Signed)
Threasa Beards called reporting patient is out of medicines and needs refills.  Asked what medicines and melanie says "everything refilled".  Lengthy medication list.  Threasa Beards will have [name] call with names of what she needs today.  Report patient missed last weeks appointments and needs to be scheduled for f/u this week as she is to be seen weekly per provider and collaborative nurse.  Noted appointment on 07-13-2013 for IVF only; no provider appointment.  Will notify providers.

## 2013-07-17 NOTE — Addendum Note (Signed)
Addended by: Cherylynn Ridges on: 07/17/2013 02:33 PM   Modules accepted: Orders

## 2013-07-17 NOTE — Telephone Encounter (Addendum)
[name] RN with Hospice called reporting patient needs Flexeril, Fentanyl, lorazepam, MSIR and phenergan.  Informed her the fentanyl should have one more patch last through 07-21-2013.  Confirmed sig for MSIR as 1-2 tabs every 4 hours as needed.  [name] says patient cannot find her pill bottles and does not know if she has any refills.  Asked that a limited two week supply be given for medicines.  Hospice is filling a pill box.  Using [facility].

## 2013-07-18 ENCOUNTER — Encounter (HOSPITAL_COMMUNITY): Payer: Self-pay | Admitting: Emergency Medicine

## 2013-07-18 ENCOUNTER — Emergency Department (HOSPITAL_COMMUNITY)
Admission: EM | Admit: 2013-07-18 | Discharge: 2013-07-18 | Disposition: A | Discharge status: Home / Self Care | Payer: Medicaid Other | Attending: Emergency Medicine | Admitting: Emergency Medicine

## 2013-07-18 DIAGNOSIS — Z87891 Personal history of nicotine dependence: Secondary | ICD-10-CM | POA: Insufficient documentation

## 2013-07-18 DIAGNOSIS — I10 Essential (primary) hypertension: Secondary | ICD-10-CM | POA: Insufficient documentation

## 2013-07-18 DIAGNOSIS — Z87440 Personal history of urinary (tract) infections: Secondary | ICD-10-CM | POA: Insufficient documentation

## 2013-07-18 DIAGNOSIS — Z862 Personal history of diseases of the blood and blood-forming organs and certain disorders involving the immune mechanism: Secondary | ICD-10-CM | POA: Insufficient documentation

## 2013-07-18 DIAGNOSIS — R142 Eructation: Secondary | ICD-10-CM | POA: Insufficient documentation

## 2013-07-18 DIAGNOSIS — R143 Flatulence: Secondary | ICD-10-CM

## 2013-07-18 DIAGNOSIS — C50919 Malignant neoplasm of unspecified site of unspecified female breast: Secondary | ICD-10-CM | POA: Insufficient documentation

## 2013-07-18 DIAGNOSIS — C787 Secondary malignant neoplasm of liver and intrahepatic bile duct: Secondary | ICD-10-CM | POA: Insufficient documentation

## 2013-07-18 DIAGNOSIS — C7951 Secondary malignant neoplasm of bone: Secondary | ICD-10-CM | POA: Insufficient documentation

## 2013-07-18 DIAGNOSIS — Z79899 Other long term (current) drug therapy: Secondary | ICD-10-CM | POA: Insufficient documentation

## 2013-07-18 DIAGNOSIS — K219 Gastro-esophageal reflux disease without esophagitis: Secondary | ICD-10-CM | POA: Insufficient documentation

## 2013-07-18 DIAGNOSIS — C7949 Secondary malignant neoplasm of other parts of nervous system: Secondary | ICD-10-CM

## 2013-07-18 DIAGNOSIS — Z8639 Personal history of other endocrine, nutritional and metabolic disease: Secondary | ICD-10-CM | POA: Insufficient documentation

## 2013-07-18 DIAGNOSIS — G8929 Other chronic pain: Secondary | ICD-10-CM | POA: Insufficient documentation

## 2013-07-18 DIAGNOSIS — C7931 Secondary malignant neoplasm of brain: Secondary | ICD-10-CM | POA: Insufficient documentation

## 2013-07-18 DIAGNOSIS — R112 Nausea with vomiting, unspecified: Secondary | ICD-10-CM | POA: Insufficient documentation

## 2013-07-18 DIAGNOSIS — C7952 Secondary malignant neoplasm of bone marrow: Secondary | ICD-10-CM

## 2013-07-18 DIAGNOSIS — Z87448 Personal history of other diseases of urinary system: Secondary | ICD-10-CM | POA: Insufficient documentation

## 2013-07-18 DIAGNOSIS — R141 Gas pain: Secondary | ICD-10-CM | POA: Insufficient documentation

## 2013-07-18 LAB — CBC WITH DIFFERENTIAL/PLATELET
Basophils Absolute: 0 10*3/uL (ref 0.0–0.1)
Basophils Relative: 0 % (ref 0–1)
EOS ABS: 0.1 10*3/uL (ref 0.0–0.7)
Eosinophils Relative: 1 % (ref 0–5)
HCT: 26.5 % — ABNORMAL LOW (ref 36.0–46.0)
HEMOGLOBIN: 8.8 g/dL — AB (ref 12.0–15.0)
LYMPHS ABS: 0.8 10*3/uL (ref 0.7–4.0)
Lymphocytes Relative: 17 % (ref 12–46)
MCH: 28.2 pg (ref 26.0–34.0)
MCHC: 33.2 g/dL (ref 30.0–36.0)
MCV: 84.9 fL (ref 78.0–100.0)
MONOS PCT: 5 % (ref 3–12)
Monocytes Absolute: 0.3 10*3/uL (ref 0.1–1.0)
Neutro Abs: 3.7 10*3/uL (ref 1.7–7.7)
Neutrophils Relative %: 76 % (ref 43–77)
Platelets: 353 10*3/uL (ref 150–400)
RBC: 3.12 MIL/uL — ABNORMAL LOW (ref 3.87–5.11)
RDW: 18.2 % — ABNORMAL HIGH (ref 11.5–15.5)
WBC: 4.8 10*3/uL (ref 4.0–10.5)

## 2013-07-18 LAB — BASIC METABOLIC PANEL
BUN: 10 mg/dL (ref 6–23)
CO2: 26 mEq/L (ref 19–32)
Calcium: 9.6 mg/dL (ref 8.4–10.5)
Chloride: 97 mEq/L (ref 96–112)
Creatinine, Ser: 0.55 mg/dL (ref 0.50–1.10)
GFR calc Af Amer: >90 mL/min (ref >90)
GLUCOSE: 104 mg/dL — AB (ref 70–99)
POTASSIUM: 4.9 meq/L (ref 3.7–5.3)
Sodium: 136 mEq/L — ABNORMAL LOW (ref 137–147)

## 2013-07-18 MED ORDER — SODIUM CHLORIDE 0.9 % IV BOLUS (SEPSIS)
1000.0000 mL | Freq: Once | INTRAVENOUS | Status: AC
  Administered 2013-07-18: 1000 mL via INTRAVENOUS

## 2013-07-18 MED ORDER — HYDROMORPHONE HCL PF 1 MG/ML IJ SOLN
1.0000 mg | INTRAMUSCULAR | Status: AC | PRN
  Administered 2013-07-18 (×3): 1 mg via INTRAVENOUS
  Filled 2013-07-18 (×3): qty 1

## 2013-07-18 MED ORDER — MORPHINE SULFATE 15 MG PO TABS: ORAL_TABLET | ORAL | Status: DC

## 2013-07-18 MED ORDER — ONDANSETRON HCL 4 MG/2ML IJ SOLN
4.0000 mg | Freq: Once | INTRAMUSCULAR | Status: AC | PRN
  Administered 2013-07-18: 4 mg via INTRAVENOUS
  Filled 2013-07-18: qty 2
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
## 2013-07-18 NOTE — ED Notes (Signed)
Patient is alert and oriented x3.  She is having generalized body aches that she has been dealing with all  Week due to being out of pain medications.  Patient is a breast cancer patient here at [facility].

## 2013-07-18 NOTE — Discharge Instructions (Signed)
Bone Metastases °Cancerous growths can begin in any part of the body. The original site of cancer is called the primary tumor or primary cancer (for example, breast cancer). After cancer has developed in one area of the body, cancerous cells from that area can break away and travel through the body's bloodstream. If these cancerous cells begin growing in another place in the body, they are called metastases. Bone metastases are cancer cells that have spread to the bone (which is different from a cancer that starts in the bone). °These secondary growths are like the original tumor. For example, if a prostate cancer spreads to bone it is called metastatic prostate cancer, or prostate cancer metastatic to bone, but not bone cancer. Cancers can spread to almost any bone; the spine and pelvis are often involved.  °Any type of cancer can spread to the bone, but the most common are breast, lung, kidney, thyroid and prostate cancers. Sometimes the primary tumor is not discovered until there are bone problems. If the primary cancer location cannot be discovered, the cancer is called cancer of unknown primary location. °SYMPTOMS  °Pain in the bones is the main symptom of bone metastases. Some other problems may occur first including: °· Decreased appetite. °· Nausea. °· Muscle weakness. °· Confusion. °· Unusual sleep patterns due to discomfort. °· Overly tired (fatigue). °· Restlessness. °Frail or brittle bones may lead to broken bones (fractures) that lead to learning what is wrong (diagnosis). A tumor often weakens the bones.  °DIAGNOSIS  °Metastatic cancers may be found months or years after or at the same time as the primary tumor. When a second tumor is found in a patient who has been treated for cancer, it is more often a metastasis than another primary tumor.  °The patient's symptoms, physical examination, X-rays and blood tests may suggest a bone metastases. In addition, an examination of tissue or a cell sample  (biopsy) is usually done to find the cancer. This sample is removed with a needle. This tissue sample must be looked at under a microscope to confirm a diagnosis. °TREATMENT  °Options generally include treatments that give relief from symptoms (palliative) or curative. Those with advanced, metastasized cancer may receive treatment focused on pain relief and prolonging life. These treatments depend on the type of cancer and its location.  °Treatment for cancer depends on its type and location. Some of these treatments are: °· Surgery to remove the original tumor and/or to remove parts of the body that produce hormones and other chemicals that make cancer worse. °· Treatment with drugs (chemotherapy). °· Bone marrow transplantations on rare occasions. °· Radiation therapy (radiotherapy). °· Hormonal therapy. °· Pain relieving medications. °Your caregiver will help you understand the likelihood that any particular treatment will be helpful for you. While some treatments aim to cure or control the cancer, others give relief from symptoms only. If you have bone metastases, radiation therapy may be recommended to treat pain (if it is in one main location). Pain medications are available. These include strong medicines like morphine. You may be instructed to take a long-acting pain medication (to control most of your pain) and a short-acting medication to control occasional flares of pain. Pain medication is sometimes also given continuously through a pump. °HOME CARE INSTRUCTIONS  °· Take medications exactly as prescribed. °· Keep any follow-up appointments. °· Pain medications can make you sleepy or confused. Do not drive, climb ladders, or do other dangerous activities while on pain medication. °· Pain medications often   cause constipation. Ask your caregiver for information on stool softeners. °· Do not share your pain medication with others. °SEEK MEDICAL CARE IF:  °· Your bone pain is not controlled. °· You are having  problems or side effects from your medication. °· You have excessive sleepiness or confusion. °SEEK IMMEDIATE MEDICAL CARE IF:  °· You fall and have any injury or pain from the fall. °· You have trouble walking. °· You have numbness or tingling in your legs. °· You develop a sudden significant worsening of your pain. °Document Released: 05/08/2002 Document Revised: 08/10/2011 Document Reviewed: 12/30/2007 °[employer]® Patient Information ©2014 [employer]. ° °

## 2013-07-18 NOTE — ED Notes (Signed)
A call placed to Hospice-[phone], but no answer.

## 2013-07-22 ENCOUNTER — Encounter (HOSPITAL_COMMUNITY): Payer: Self-pay | Admitting: Emergency Medicine

## 2013-07-22 ENCOUNTER — Emergency Department (HOSPITAL_COMMUNITY)

## 2013-07-22 ENCOUNTER — Inpatient Hospital Stay (HOSPITAL_COMMUNITY)

## 2013-07-22 ENCOUNTER — Other Ambulatory Visit (HOSPITAL_COMMUNITY)

## 2013-07-22 ENCOUNTER — Inpatient Hospital Stay (HOSPITAL_COMMUNITY)
Admission: EM | Admit: 2013-07-22 | Discharge: 2013-07-26 | DRG: 597 | Disposition: A | Discharge status: Hospice | Attending: Oncology | Admitting: Oncology

## 2013-07-22 DIAGNOSIS — R112 Nausea with vomiting, unspecified: Secondary | ICD-10-CM | POA: Diagnosis present

## 2013-07-22 DIAGNOSIS — R262 Difficulty in walking, not elsewhere classified: Secondary | ICD-10-CM | POA: Diagnosis present

## 2013-07-22 DIAGNOSIS — Z800 Family history of malignant neoplasm of digestive organs: Secondary | ICD-10-CM

## 2013-07-22 DIAGNOSIS — R1310 Dysphagia, unspecified: Secondary | ICD-10-CM

## 2013-07-22 DIAGNOSIS — Z9221 Personal history of antineoplastic chemotherapy: Secondary | ICD-10-CM

## 2013-07-22 DIAGNOSIS — C7949 Secondary malignant neoplasm of other parts of nervous system: Secondary | ICD-10-CM

## 2013-07-22 DIAGNOSIS — R109 Unspecified abdominal pain: Secondary | ICD-10-CM

## 2013-07-22 DIAGNOSIS — K5900 Constipation, unspecified: Secondary | ICD-10-CM

## 2013-07-22 DIAGNOSIS — G992 Myelopathy in diseases classified elsewhere: Secondary | ICD-10-CM | POA: Diagnosis present

## 2013-07-22 DIAGNOSIS — R748 Abnormal levels of other serum enzymes: Secondary | ICD-10-CM

## 2013-07-22 DIAGNOSIS — R29898 Other symptoms and signs involving the musculoskeletal system: Secondary | ICD-10-CM | POA: Diagnosis present

## 2013-07-22 DIAGNOSIS — C7931 Secondary malignant neoplasm of brain: Secondary | ICD-10-CM | POA: Diagnosis present

## 2013-07-22 DIAGNOSIS — M6281 Muscle weakness (generalized): Secondary | ICD-10-CM | POA: Diagnosis present

## 2013-07-22 DIAGNOSIS — Z66 Do not resuscitate: Secondary | ICD-10-CM | POA: Diagnosis present

## 2013-07-22 DIAGNOSIS — Z87891 Personal history of nicotine dependence: Secondary | ICD-10-CM

## 2013-07-22 DIAGNOSIS — G8220 Paraplegia, unspecified: Secondary | ICD-10-CM | POA: Diagnosis present

## 2013-07-22 DIAGNOSIS — Z923 Personal history of irradiation: Secondary | ICD-10-CM

## 2013-07-22 DIAGNOSIS — M549 Dorsalgia, unspecified: Secondary | ICD-10-CM

## 2013-07-22 DIAGNOSIS — T4275XA Adverse effect of unspecified antiepileptic and sedative-hypnotic drugs, initial encounter: Secondary | ICD-10-CM | POA: Diagnosis present

## 2013-07-22 DIAGNOSIS — C787 Secondary malignant neoplasm of liver and intrahepatic bile duct: Secondary | ICD-10-CM | POA: Diagnosis present

## 2013-07-22 DIAGNOSIS — C50919 Malignant neoplasm of unspecified site of unspecified female breast: Principal | ICD-10-CM | POA: Diagnosis present

## 2013-07-22 DIAGNOSIS — G959 Disease of spinal cord, unspecified: Secondary | ICD-10-CM | POA: Diagnosis present

## 2013-07-22 DIAGNOSIS — R339 Retention of urine, unspecified: Secondary | ICD-10-CM | POA: Diagnosis present

## 2013-07-22 DIAGNOSIS — R634 Abnormal weight loss: Secondary | ICD-10-CM

## 2013-07-22 DIAGNOSIS — Z681 Body mass index (BMI) 19 or less, adult: Secondary | ICD-10-CM

## 2013-07-22 DIAGNOSIS — C7951 Secondary malignant neoplasm of bone: Secondary | ICD-10-CM | POA: Diagnosis present

## 2013-07-22 DIAGNOSIS — E43 Unspecified severe protein-calorie malnutrition: Secondary | ICD-10-CM | POA: Diagnosis present

## 2013-07-22 DIAGNOSIS — I10 Essential (primary) hypertension: Secondary | ICD-10-CM | POA: Diagnosis present

## 2013-07-22 DIAGNOSIS — C7952 Secondary malignant neoplasm of bone marrow: Secondary | ICD-10-CM

## 2013-07-22 DIAGNOSIS — Z87440 Personal history of urinary (tract) infections: Secondary | ICD-10-CM

## 2013-07-22 DIAGNOSIS — K5909 Other constipation: Secondary | ICD-10-CM | POA: Diagnosis present

## 2013-07-22 DIAGNOSIS — D649 Anemia, unspecified: Secondary | ICD-10-CM | POA: Diagnosis present

## 2013-07-22 DIAGNOSIS — Z515 Encounter for palliative care: Secondary | ICD-10-CM

## 2013-07-22 DIAGNOSIS — Z9010 Acquired absence of unspecified breast and nipple: Secondary | ICD-10-CM

## 2013-07-22 DIAGNOSIS — K219 Gastro-esophageal reflux disease without esophagitis: Secondary | ICD-10-CM | POA: Diagnosis present

## 2013-07-22 LAB — CBC
HCT: 26.4 % — ABNORMAL LOW (ref 36.0–46.0)
Hemoglobin: 8.5 g/dL — ABNORMAL LOW (ref 12.0–15.0)
MCH: 28 pg (ref 26.0–34.0)
MCHC: 32.2 g/dL (ref 30.0–36.0)
MCV: 86.8 fL (ref 78.0–100.0)
PLATELETS: 314 10*3/uL (ref 150–400)
RBC: 3.04 MIL/uL — ABNORMAL LOW (ref 3.87–5.11)
RDW: 18.1 % — AB (ref 11.5–15.5)
WBC: 5.8 10*3/uL (ref 4.0–10.5)

## 2013-07-22 LAB — URINALYSIS, ROUTINE W REFLEX MICROSCOPIC
BILIRUBIN URINE: NEGATIVE
GLUCOSE, UA: NEGATIVE mg/dL
HGB URINE DIPSTICK: NEGATIVE
Ketones, ur: NEGATIVE mg/dL
LEUKOCYTES UA: NEGATIVE
Nitrite: NEGATIVE
Protein, ur: NEGATIVE mg/dL
SPECIFIC GRAVITY, URINE: 1.012 (ref 1.005–1.030)
Urobilinogen, UA: 1 mg/dL (ref 0.0–1.0)
pH: 7 (ref 5.0–8.0)

## 2013-07-22 LAB — COMPREHENSIVE METABOLIC PANEL
ALBUMIN: 2.8 g/dL — AB (ref 3.5–5.2)
ALK PHOS: 958 U/L — AB (ref 39–117)
ALT: 28 U/L (ref 0–35)
AST: 84 U/L — AB (ref 0–37)
BUN: 14 mg/dL (ref 6–23)
CHLORIDE: 95 meq/L — AB (ref 96–112)
CO2: 24 mEq/L (ref 19–32)
Calcium: 9.9 mg/dL (ref 8.4–10.5)
Creatinine, Ser: 0.67 mg/dL (ref 0.50–1.10)
GFR calc Af Amer: >90 mL/min (ref >90)
GFR calc non Af Amer: >90 mL/min (ref >90)
Glucose, Bld: 137 mg/dL — ABNORMAL HIGH (ref 70–99)
POTASSIUM: 4.4 meq/L (ref 3.7–5.3)
SODIUM: 135 meq/L — AB (ref 137–147)
TOTAL PROTEIN: 6.9 g/dL (ref 6.0–8.3)
Total Bilirubin: 0.8 mg/dL (ref 0.3–1.2)

## 2013-07-22 LAB — PROTIME-INR
INR: 1.15 (ref 0.00–1.49)
PROTHROMBIN TIME: 14.5 s (ref 11.6–15.2)

## 2013-07-22 LAB — CK: CK TOTAL: 88 U/L (ref 7–177)

## 2013-07-22 LAB — PREALBUMIN: Prealbumin: 10.5 mg/dL — ABNORMAL LOW (ref 17.0–34.0)

## 2013-07-22 MED ORDER — HYDROMORPHONE HCL PF 2 MG/ML IJ SOLN
2.0000 mg | INTRAMUSCULAR | Status: DC | PRN
  Administered 2013-07-22: 4 mg via INTRAVENOUS
  Administered 2013-07-22 (×4): 2 mg via INTRAVENOUS
  Administered 2013-07-23 (×3): 4 mg via INTRAVENOUS
  Filled 2013-07-22 (×2): qty 2
  Filled 2013-07-22: qty 1
  Filled 2013-07-22: qty 2
  Filled 2013-07-22: qty 1
  Filled 2013-07-22: qty 2
  Filled 2013-07-22 (×2): qty 1

## 2013-07-22 MED ORDER — DEXAMETHASONE SODIUM PHOSPHATE 10 MG/ML IJ SOLN
20.0000 mg | Freq: Once | INTRAMUSCULAR | Status: AC
  Administered 2013-07-22: 20 mg via INTRAVENOUS
  Filled 2013-07-22: qty 2

## 2013-07-22 MED ORDER — ACETAMINOPHEN 650 MG RE SUPP: 650.0000 mg | Freq: Four times a day (QID) | RECTAL | Status: DC | PRN

## 2013-07-22 MED ORDER — PANTOPRAZOLE SODIUM 40 MG PO TBEC
40.0000 mg | DELAYED_RELEASE_TABLET | Freq: Every day | ORAL | Status: DC
  Administered 2013-07-22 – 2013-07-26 (×5): 40 mg via ORAL
  Filled 2013-07-22 (×5): qty 1

## 2013-07-22 MED ORDER — ACETAMINOPHEN 325 MG PO TABS: 650.0000 mg | ORAL_TABLET | Freq: Four times a day (QID) | ORAL | Status: DC | PRN

## 2013-07-22 MED ORDER — ONDANSETRON HCL 4 MG PO TABS: 4.0000 mg | ORAL_TABLET | Freq: Four times a day (QID) | ORAL | Status: DC | PRN

## 2013-07-22 MED ORDER — LACTULOSE 10 GM/15ML PO SOLN
10.0000 g | Freq: Three times a day (TID) | ORAL | Status: DC
  Administered 2013-07-22 – 2013-07-25 (×8): 10 g via ORAL
  Filled 2013-07-22 (×15): qty 15

## 2013-07-22 MED ORDER — CYCLOBENZAPRINE HCL 10 MG PO TABS
10.0000 mg | ORAL_TABLET | Freq: Three times a day (TID) | ORAL | Status: DC | PRN
  Administered 2013-07-23 – 2013-07-24 (×3): 10 mg via ORAL
  Filled 2013-07-22 (×3): qty 1

## 2013-07-22 MED ORDER — DEXAMETHASONE SODIUM PHOSPHATE 10 MG/ML IJ SOLN
30.0000 mg | INTRAMUSCULAR | Status: DC
  Filled 2013-07-22 (×2): qty 3

## 2013-07-22 MED ORDER — HYDROMORPHONE HCL PF 1 MG/ML IJ SOLN: 1.0000 mg | INTRAMUSCULAR | Status: DC | PRN

## 2013-07-22 MED ORDER — MORPHINE SULFATE 2 MG/ML IJ SOLN: 2.0000 mg | INTRAMUSCULAR | Status: DC | PRN

## 2013-07-22 MED ORDER — SODIUM CHLORIDE 0.9 % IJ SOLN
3.0000 mL | Freq: Two times a day (BID) | INTRAMUSCULAR | Status: DC
  Administered 2013-07-22: 3 mL via INTRAVENOUS

## 2013-07-22 MED ORDER — FLUCONAZOLE 100 MG PO TABS
100.0000 mg | ORAL_TABLET | Freq: Every day | ORAL | Status: DC
  Administered 2013-07-22 – 2013-07-24 (×3): 100 mg via ORAL
  Filled 2013-07-22 (×4): qty 1

## 2013-07-22 MED ORDER — SODIUM CHLORIDE 0.9 % IV SOLN
INTRAVENOUS | Status: DC
  Administered 2013-07-22: 07:00:00 via INTRAVENOUS

## 2013-07-22 MED ORDER — GADOBENATE DIMEGLUMINE 529 MG/ML IV SOLN
8.0000 mL | Freq: Once | INTRAVENOUS | Status: AC | PRN
  Administered 2013-07-22: 8 mL via INTRAVENOUS

## 2013-07-22 MED ORDER — ONDANSETRON HCL 4 MG/2ML IJ SOLN
4.0000 mg | Freq: Four times a day (QID) | INTRAMUSCULAR | Status: DC | PRN
  Administered 2013-07-23: 4 mg via INTRAVENOUS
  Filled 2013-07-22: qty 2

## 2013-07-22 MED ORDER — LORAZEPAM 1 MG PO TABS: 1.0000 mg | ORAL_TABLET | Freq: Four times a day (QID) | ORAL | Status: DC | PRN

## 2013-07-22 MED ORDER — SODIUM CHLORIDE 0.9 % IV SOLN
INTRAVENOUS | Status: DC
  Administered 2013-07-22 (×3): via INTRAVENOUS

## 2013-07-22 MED ORDER — DEXAMETHASONE 4 MG PO TABS
4.0000 mg | ORAL_TABLET | Freq: Four times a day (QID) | ORAL | Status: DC
  Administered 2013-07-22 (×2): 4 mg via ORAL
  Filled 2013-07-22 (×4): qty 1

## 2013-07-22 MED ORDER — POLYETHYLENE GLYCOL 3350 17 G PO PACK
17.0000 g | PACK | Freq: Every day | ORAL | Status: DC
  Filled 2013-07-22: qty 1

## 2013-07-22 MED ORDER — FENTANYL 100 MCG/HR TD PT72
100.0000 ug | MEDICATED_PATCH | TRANSDERMAL | Status: DC
  Administered 2013-07-22: 100 ug via TRANSDERMAL
  Filled 2013-07-22: qty 1

## 2013-07-22 NOTE — ED Provider Notes (Signed)
CSN: [id number]     Arrival date & time 07/22/13  0043 History   First MD Initiated Contact with Patient 07/22/13 [phone]     Chief Complaint  Patient presents with  . Urinary Retention  . Constipation     (Consider location/radiation/quality/duration/timing/severity/associated sxs/prior Treatment) HPI History provided by family bedside. History of metastatic breast cancer, lives at home with her daughter, is on hospice. Family found her down at home this evening, unable to urinate and too weak to walk. She has had progressive lower extremity weakness over the last week requiring a wheelchair. On arrival to emergency department complaining of need to urinate. for suprapubic fullness and suspected retention, RN provided in the cath which reported 1500 cc urine output. Patient states she was unable to urinate all day long. She is very limited historian, reportedly has metastatic disease to the brain. 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 Patient currently unable to state her wishes regarding further surgery or aggressive measures should she require them.  She denies any pain at this time. She does not provide any significant history and level V caveat applies  Past Medical History  Diagnosis Date  . Hypertension   . GERD (gastroesophageal reflux disease)   . Pyelonephritis 10/01/11  . Recurrent UTI (urinary tract infection) 10/01/11  . S/P chemotherapy, time since 4-12 weeks   . S/P radiation > 12 weeks 03/15/12- 04/07/12    right hip, clivus  . Hypercalcemia 06/21/2012  . History of radiation therapy 08/06/10 -09/16/10    R supraclav fossa, R chest wall, R drain site  . radiaiton therapy 07/22/12 -08/15/12    T4-T7  . 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 Brain cancer     spread from breast ca  . Hospice care patient 11/09/2012    [facility] [place]-call [phone]   Past Surgical History  Procedure Laterality Date  . Cesarean section  1989; 2010  . Mastectomy  2011    right  . Back surgery  2012    "removed tumor,cancer, from my spine  . Port-a-cath removal  2012    right chest  . Portacath placement  2011    right  . Tubal ligation  2010  . Breast biopsy  2011    right  . Hip arthroplasty  05/04/2012    Procedure: ARTHROPLASTY BIPOLAR HIP;  Surgeon: Mauri Pole, MD;  Location: [facility];  Service: Orthopedics;  Laterality: Right;  RIGHT HIP HEMI ARTHROPLASTY    Family History  Problem Relation Age of Onset  . Cancer Brother 61    died of colon cancer at age of 1  . High blood pressure Mother    History  Substance Use Topics  . Smoking status: Former Smoker -- 0.25 packs/day for 31 years    Types: Cigarettes    Quit date: 04/26/2012  . Smokeless tobacco: Never Used  . Alcohol Use: No   OB History   Grav Para Term Preterm Abortions TAB SAB Ect Mult Living                 Review of Systems  Unable to perform ROS  level V caveat as above    Allergies  Review of patient's allergies indicates no known allergies.  Home Medications   Current Outpatient Rx  Name  Route  Sig  Dispense  Refill  . cyclobenzaprine (FLEXERIL) 10 MG tablet   Oral   Take 1 tablet (10 mg total) by mouth 3 (three) times daily as needed for muscle spasms.   60 tablet   2     Hospice Patient   . dexamethasone (DECADRON) 4 MG tablet   Oral   Take 8 mg by mouth 2 (two) times daily with a meal.         . diphenhydrAMINE (BENADRYL) 25 mg capsule   Oral   Take 1 capsule (25 mg total) by mouth every 6 (six) hours as needed for itching.   30 capsule   0   . exemestane (AROMASIN) 25 MG tablet   Oral   Take 1 tablet (25 mg total) by mouth daily after breakfast.   30 tablet   3     [facility]   . fentaNYL (DURAGESIC - DOSED MCG/HR) 100 MCG/HR   Transdermal   Place 100 mcg onto the  skin every 3 (three) days.         . fluconazole (DIFLUCAN) 100 MG tablet   Oral   Take 100 mg by mouth daily.         [name] guaiFENesin (ROBITUSSIN) 100 MG/5ML SOLN   Oral   Take 5 mLs (100 mg total) by mouth every 4 (four) hours as needed for cough or to loosen phlegm.   1200 mL   0   . lactulose (CEPHULAC) 10 G packet   Oral   Take 1 packet (10 g total) by mouth 3 (three) times daily.   30 each   0   . LORazepam (ATIVAN) 1 MG tablet   Oral   Take 1 tablet (1 mg total) by mouth every 6 (six) hours as needed for anxiety.   30 tablet   0     Hospice Patient   . omeprazole (PRILOSEC) 40 MG capsule   Oral   Take 1 capsule (40 mg total) by mouth daily.   30 capsule   3     [facility] [place] [facility]   . ondansetron (ZOFRAN-ODT) 4 MG disintegrating tablet   Oral   Take 4 mg by mouth every 4 (four) hours as needed for nausea or vomiting.         . polyethylene glycol (MIRALAX / GLYCOLAX) packet   Oral   Take 17 g by mouth daily.   14 each   2   . promethazine (PHENERGAN) 25 MG tablet   Oral   Take 1 tablet (25 mg total) by mouth every 6 (six) hours as needed for nausea.   30 tablet   1     [facility] [place]   . senna-docusate (SENOKOT-S) 8.6-50 MG per tablet   Oral   Take 2-4 tablets by mouth 2 (two) times daily.         [name] triamterene-hydrochlorothiazide (DYAZIDE) 37.5-25 MG per capsule   Oral   Take 1 each (1 capsule total) by mouth every morning.   30 capsule   12    BP 131/82  Pulse 111  Temp(Src) 98.6 F (37 C) (Oral)  Resp 20  SpO2 97% Physical Exam  Constitutional: She appears well-developed and well-nourished.  HENT:  Head: Normocephalic and atraumatic.  Eyes: EOM are normal. Pupils are equal, round, and reactive to light.  Neck: Neck supple.  Cardiovascular: Regular rhythm and intact distal pulses.   Tachycardic  Pulmonary/Chest: Effort normal.  No respiratory distress.  Abdominal: Soft.  Suprapubic fullness and  tenderness  Musculoskeletal:  Normal Range of motion upper extremities with equal pulses. Patient is Unable to lift lower extremities off the bed, gross sensorium to light touch intact throughout lower extremities with equal dorsalis pedis pulses.   Neurological: She is alert.  Skin: Skin is warm and dry.    ED Course  Procedures (including critical care time) Labs Review Labs Reviewed  CBC - Abnormal; Notable for the following:    RBC 3.04 (*)    Hemoglobin 8.5 (*)    HCT 26.4 (*)    RDW 18.1 (*)    All other components within normal limits  COMPREHENSIVE METABOLIC PANEL - Abnormal; Notable for the following:    Sodium 135 (*)    Chloride 95 (*)    Glucose, Bld 137 (*)    Albumin 2.8 (*)    AST 84 (*)    Alkaline Phosphatase 958 (*)    All other components within normal limits  URINALYSIS, ROUTINE W REFLEX MICROSCOPIC  CK   Imaging Review Dg Lumbar Spine Complete  07/22/2013   CLINICAL DATA:  Back pain.  Metastatic breast cancer.  EXAM: LUMBAR SPINE - COMPLETE 4+ VIEW  COMPARISON:  DG ABD ACUTE W/CHEST dated 06/25/2013; MR ABDOMEN WO/W CM dated 10/28/2012  FINDINGS: Innumerable calcific densities noted over the liver consistent with known metastatic disease. Lytic lesion is noted of the right eleventh rib consistent with metastatic disease. Sclerosis noted in the right tenth rib and mild multifocal sclerosis noted over the lumbar spine, sacrum, and iliac crests suggesting metastatic disease. No acute lumbar spine fracture. Calcification within the pelvis most consistent with a fibroid.  IMPRESSION: 1. Multiple calcific densities projected over the liver consistent with known hepatic metastatic disease. 2. Lytic lesion right eleventh rib consistent wake metastatic disease. Sclerotic lesion right tenth rib consistent with metastatic disease. Subtle sclerosis noted lumbar spine and in the pelvis suggesting metastatic disease to these regions. No evidence of lumbar spine fracture.    Electronically Signed   By: Marcello Moores  Register   On: 07/22/2013 02:40   IV fluids provided  Medicine consult - DR Roel Cluck evaluated bedside, patient remains having difficulty making medical decisions. Plan admit medicine MRI pending.   MDM   Diagnosis: Lower extremity weakness, urinary retention, history of metastatic breast cancer, history of spinal cord compression  Workup as above with urinalysis, labs and imaging reviewed. No significant changes in the emergency department. MRI ordered/ pending. MED admit.    Teressa Lower, MD 07/22/13 [phone]

## 2013-07-22 NOTE — Progress Notes (Signed)
DNR.  Related admission.  Patient's hospice dx is Breast CA with mets to bone and liver.  Patient awake and verbal.  States she is feeling better now that she is in the hospital.  Foley cath just placed.  No concerns voiced by patient at this time.  Please contact [facility] with any questions or concerns.  Vance Gather, RN [facility]

## 2013-07-22 NOTE — ED Notes (Signed)
Per EMS - pt from home, pt w/ urinary retention that began yesterday - pt's home health nurse was unable to successfully cath pt. Pt moaning on arrival repeating that she needs to urinate.

## 2013-07-22 NOTE — ED Notes (Signed)
Dr. Doutova at bedside.  

## 2013-07-22 NOTE — H&P (Signed)
PCP: none [facility]  Chief Complaint:  Difficult walking  HPI: [name] is a 48 y.o. female   has a past medical history of Hypertension; GERD (gastroesophageal reflux disease); Pyelonephritis (10/01/11); Recurrent UTI (urinary tract infection) (10/01/11); S/P chemotherapy, time since 4-12 weeks; S/P radiation > 12 weeks (03/15/12- 04/07/12); Hypercalcemia (06/21/2012); History of radiation therapy (08/06/10 -09/16/10); radiaiton therapy (07/22/12 -08/15/12); Bone metastases (2012); 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 (03/21/2011); Brain cancer; and Hospice care patient (11/09/2012).   Presented with  Patient states she has trouble with right leg weakness for past few days and urinary retention for the past 24-48 hours. Ex boyfriend states the last time he saw her walk   was Wednesday   3 days ago. Patient states she has self discontinued her decadron due to hallucinations and unsure what medications she is currently taking. Per history she may have a recent fall but she is unable to provide details.  Patient has hx of Metastatic breast cancer with mets to brain, liver and bone. On exam she is densely paraplegic with preserved sensation over lower extremities.  Patient states she does not feel when she needs to urinate on presentation to ER she was found to have distended bladder he needed to be catheterized. She is currently on Hospice care but unsure how aggressive she would like [place] to be. Patient had hx in the impending spinal cord compression in the past treated with laminectomy and radiation therapy but at that time patient was not actively on hospice. At this point patient is not sure whether she wants any intervention so not. She smoked some sort about being n.p.o. for any studies. She appears to lack insight of the severity of her illness. Patient currently resides with her daughter and her daughter's boyfriend and young children. She states she does not wish to leave home except for being  hospitalized.   Review of Systems:    Pertinent positives include: fatigue, weight loss leg weakness  Constitutional:  No weight loss, night sweats, Fevers, chills,   HEENT:  No headaches, Difficulty swallowing,Tooth/dental problems,Sore throat,  No sneezing, itching, ear ache, nasal congestion, post nasal drip,  Cardio-vascular:  No chest pain, Orthopnea, PND, anasarca, dizziness, palpitations.no Bilateral lower extremity swelling  GI:  No heartburn, indigestion, abdominal pain, nausea, vomiting, diarrhea, change in bowel habits, loss of appetite, melena, blood in stool, hematemesis Resp:  no shortness of breath at rest. No dyspnea on exertion, No excess mucus, no productive cough, No non-productive cough, No coughing up of blood.No change in color of mucus.No wheezing. Skin:  no rash or lesions. No jaundice GU:  no dysuria, change in color of urine, no urgency or frequency. No straining to urinate.  No flank pain.  Musculoskeletal:  No joint pain or no joint swelling. No decreased range of motion. No back pain.  Psych:  No change in mood or affect. No depression or anxiety. No memory loss.  Neuro: no localizing neurological complaints, no tingling, no weakness, no double vision, no gait abnormality, no slurred speech, no confusion  Otherwise ROS are negative except for above, 10 systems were reviewed  Past Medical History: Past Medical History  Diagnosis Date  . Hypertension   . GERD (gastroesophageal reflux disease)   . Pyelonephritis 10/01/11  . Recurrent UTI (urinary tract infection) 10/01/11  . S/P chemotherapy, time since 4-12 weeks   . S/P radiation > 12 weeks 03/15/12- 04/07/12    right hip, clivus  . Hypercalcemia 06/21/2012  . History of  radiation therapy 08/06/10 -09/16/10    R supraclav fossa, R chest wall, R drain site  . radiaiton therapy 07/22/12 -08/15/12    T4-T7  . 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 03/21/2011     bone and brain  . Brain cancer     spread from breast ca  . Hospice care patient 11/09/2012    [facility] [place]-call [phone]   Past Surgical History  Procedure Laterality Date  . Cesarean section  1989; 2010  . Mastectomy  2011    right  . Back surgery  2012    "removed tumor,cancer, from my spine  . Port-a-cath removal  2012    right chest  . Portacath placement  2011    right  . Tubal ligation  2010  . Breast biopsy  2011    right  . Hip arthroplasty  05/04/2012    Procedure: ARTHROPLASTY BIPOLAR HIP;  Surgeon: Mauri Pole, MD;  Location: [facility];  Service: Orthopedics;  Laterality: Right;  RIGHT HIP HEMI ARTHROPLASTY      Medications: Prior to Admission medications   Medication Sig Start Date End Date Taking? Authorizing Provider  cyclobenzaprine (FLEXERIL) 10 MG tablet Take 1 tablet (10 mg total) by mouth 3 (three) times daily as needed for muscle spasms. 07/17/13  Yes Amy Milda Smart, PA-C  dexamethasone (DECADRON) 4 MG tablet Take 8 mg by mouth 2 (two) times daily with a meal.   Yes Historical Provider, MD  diphenhydrAMINE (BENADRYL) 25 mg capsule Take 1 capsule (25 mg total) by mouth every 6 (six) hours as needed for itching. 07/06/13  Yes Chauncey Cruel, MD  exemestane (AROMASIN) 25 MG tablet Take 1 tablet (25 mg total) by mouth daily after breakfast. 12/09/12  Yes Chauncey Cruel, MD  fentaNYL (DURAGESIC - DOSED MCG/HR) 100 MCG/HR Place 100 mcg onto the skin every 3 (three) days. 07/17/13  Yes Amy Milda Smart, PA-C  fluconazole (DIFLUCAN) 100 MG tablet Take 100 mg by mouth daily. 06/23/13  Yes Chauncey Cruel, MD  guaiFENesin (ROBITUSSIN) 100 MG/5ML SOLN Take 5 mLs (100 mg total) by mouth every 4 (four) hours as needed for cough or to loosen phlegm. 06/29/13  Yes Carlisle Cater, PA-C  lactulose (CEPHULAC) 10 G packet Take 1 packet (10 g total) by mouth 3 (three) times daily. 06/25/13  Yes Leota Jacobsen, MD  LORazepam (ATIVAN) 1 MG tablet Take 1 tablet (1  mg total) by mouth every 6 (six) hours as needed for anxiety. 07/17/13  Yes Amy Milda Smart, PA-C  omeprazole (PRILOSEC) 40 MG capsule Take 1 capsule (40 mg total) by mouth daily. 12/06/12  Yes Chauncey Cruel, MD  ondansetron (ZOFRAN-ODT) 4 MG disintegrating tablet Take 4 mg by mouth every 4 (four) hours as needed for nausea or vomiting. 12/10/12  Yes Hannah Muthersbaugh, PA-C  polyethylene glycol (MIRALAX / GLYCOLAX) packet Take 17 g by mouth daily. 03/14/13  Yes Chauncey Cruel, MD  promethazine (PHENERGAN) 25 MG tablet Take 1 tablet (25 mg total) by mouth every 6 (six) hours as needed for nausea. 07/17/13  Yes Amy Milda Smart, PA-C  senna-docusate (SENOKOT-S) 8.6-50 MG per tablet Take 2-4 tablets by mouth 2 (two) times daily.   Yes Historical Provider, MD  triamterene-hydrochlorothiazide (DYAZIDE) 37.5-25 MG per capsule Take 1 each (1 capsule total) by mouth every morning. 01/05/13  Yes Chauncey Cruel, MD    Allergies:  No Known Allergies  Social History:  Ambulatory  independently prior to incident Lives at  home family Hospice [phone] Daughter [phone]   reports that she quit smoking about 14 months ago. Her smoking use included Cigarettes. She has a 7.75 pack-year smoking history. She has never used smokeless tobacco. She reports that she uses illicit drugs ("Crack" cocaine and Marijuana). She reports that she does not drink alcohol.   Family History: family history includes Cancer (age of onset: 59) in her brother; High blood pressure in her mother.    Physical Exam: Patient Vitals for the past 24 hrs:  BP Temp Temp src Pulse Resp SpO2  07/22/13 0157 - - - 111 - -  07/22/13 0052 131/82 mmHg 98.6 F (37 C) Oral 125 20 97 %    1. General:  in No Acute distress 2. Psychological: Alert and Oriented 3. Head/ENT:     Dry Mucous Membranes                          Head Non traumatic, neck supple                           Poor Dentition  dysconjugate gaze unsure of duration  per family 4. SKIN:   decreased Skin turgor,  Skin clean Dry and intact no rash 5. Heart: Regular rate and rhythm no Murmur, Rub or gallop 6. Lungs: Clear to auscultation bilaterally, no wheezes or crackles   7. Abdomen: firm, non-tender,  Distended large palpable mass present 8. Lower extremities: no clubbing, cyanosis, or edema 9. Neurologically dense paraplegia but retained sensation 10. MSK: Normal range of motion  body mass index is unknown because there is no weight on file.   Labs on Admission:   Recent Labs  07/22/13 0215  NA 135*  K 4.4  CL 95*  CO2 24  GLUCOSE 137*  BUN 14  CREATININE 0.67  CALCIUM 9.9    Recent Labs  07/22/13 0215  AST 84*  ALT 28  ALKPHOS 958*  BILITOT 0.8  PROT 6.9  ALBUMIN 2.8*   No results found for this basename: LIPASE, AMYLASE,  in the last 72 hours  Recent Labs  07/22/13 0215  WBC 5.8  HGB 8.5*  HCT 26.4*  MCV 86.8  PLT 314    Recent Labs  07/22/13 0215  CKTOTAL 88   No results found for this basename: TSH, T4TOTAL, FREET3, T3FREE, THYROIDAB,  in the last 72 hours No results found for this basename: VITAMINB12, FOLATE, FERRITIN, TIBC, IRON, RETICCTPCT,  in the last 72 hours Lab Results  Component Value Date   HGBA1C 6.3* 10/03/2011    The CrCl is unknown because both a height and weight (above a minimum accepted value) are required for this calculation. ABG    Component Value Date/Time   TCO2 27 10/16/2012 2305     No results found for this basename: DDIMER     Other results:    UA no evidence of UTI   Cultures:    Component Value Date/Time   SDES URINE, CLEAN CATCH 06/29/2013 2122   [place] NONE 06/29/2013 2122   CULT  Value: NO GROWTH Performed at [facility] 06/29/2013 2122   REPTSTATUS 06/30/2013 FINAL 06/29/2013 2122       Radiological Exams on Admission: Dg Lumbar Spine Complete  07/22/2013   CLINICAL DATA:  Back pain.  Metastatic breast cancer.  EXAM: LUMBAR SPINE - COMPLETE 4+  VIEW  COMPARISON:  DG ABD ACUTE W/CHEST dated 06/25/2013; MR ABDOMEN WO/W CM dated 10/28/2012  FINDINGS: Innumerable calcific densities noted over the liver consistent with known metastatic disease. Lytic lesion is noted of the right eleventh rib consistent with metastatic disease. Sclerosis noted in the right tenth rib and mild multifocal sclerosis noted over the lumbar spine, sacrum, and iliac crests suggesting metastatic disease. No acute lumbar spine fracture. Calcification within the pelvis most consistent with a fibroid.  IMPRESSION: 1. Multiple calcific densities projected over the liver consistent with known hepatic metastatic disease. 2. Lytic lesion right eleventh rib consistent wake metastatic disease. Sclerotic lesion right tenth rib consistent with metastatic disease. Subtle sclerosis noted lumbar spine and in the pelvis suggesting metastatic disease to these regions. No evidence of lumbar spine fracture.   Electronically Signed   By: Marcello Moores  Register   On: 07/22/2013 02:40    Chart has been reviewed  Assessment/Plan  48 yo F with hx of diffusely metastatic breast cancer on hospice care who is unsure of her current goals of treatment here with dense bilateral paraplegia most likely due to cord compression occuring over past few days but seems to be worsened by the fall.  Present on Admission:  . Paraplegia - suspicious for subacute cord compression but duration of symptoms unclear likely days, patient is interested in studies being done despite being on hospice at this time. Agreed to obtain lumbar MRI ASAP and after that had a discussion regarding options Possible paliative radiation. Decadron IV 20 mg followed by 4 mg PO q 6 hours. . Protein-calorie malnutrition, severe - nutrition consult . 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, brain and bone will discuss with oncology on call to clarify plan. Dr Marin Olp to see in AM . Urinary retention - foley placement    Prophylaxis: SCD ,  Protonix  CODE STATUS: DNR/DNI as per patient wishes  Other plan as per orders.  I have spent a total of 70 min on this admission time taken to discuss care with radiology, oncology.   Hargis Vandyne 07/22/2013, 4:47 AM

## 2013-07-22 NOTE — Progress Notes (Addendum)
Please see earlier admission note by Dr. Roel Cluck. Pt seen and examined at bedside and clinically stable, A&O x 3, denies pain, no shortness of breath, no abdominal or urinary concerns. Monitor urine output. MRI lumbar spine with no neural impingement to explain the patient's urinary retention and right leg weakness. I called hospice at [phone] to make them aware that pt has been hospitalized.  Continue analgesia and antiemetics as needed, muscle relaxants as needed as well. Check CBC and BMP in AM.  Faye Ramsay, MD  [facility] Pager [phone]  If 7PM-7AM, please contact night-coverage [email or web address] Password [email or web address]

## 2013-07-22 NOTE — Consult Note (Signed)
[name], [name] NO.:  [id number]  MEDICAL RECORD NO.:  [id number]  LOCATION:  81                         FACILITY:  [facility]  PHYSICIAN:  Volanda Napoleon, M.D.  DATE OF BIRTH:  09-04-1965  DATE OF CONSULTATION: DATE OF DISCHARGE:                                CONSULTATION   REFERRING PHYSICIAN:  Dr. Angeline Slim. Doyle Askew.  REASON FOR CONSULTATION: 1. Metastatic breast cancer. 2. 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 3. Urinary retention likely secondary to cord compression.  HISTORY OF PRESENT ILLNESS:  [name] is a nice 48 year old African American female.  She has metastatic breast cancer.  She initially presented back in November of 2010.  She had stage IIIA disease.  She had adjuvant chemotherapy.  She had radiation therapy.  She was then put on tamoxifen.  She was then found to have metastatic disease to the liver in October, 2012.  She was placed on hormonal therapy.  She was on Faslodex and Zoladex.  She also received Zometa.  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  This was in October of 2012.  She had a surgery followed by radiation therapy.  She also had brain mets.  She had radiation to the brain.  She continued to progress.  She had right hip fracture in December of 2013.  She had a right hip arthroplasty for this.  She is followed by [name].  She has been hospitalized frequently.  She is on hospice.  She basically was admitted because of inability to walk.  This happened quickly.  She could not walk the day prior to admission.  She had urinary retention.  The last time that she walked was on Wednesday.  She was subsequently admitted.  She came in on the 21st.  Her calcium was 9.9 with an albumin of 2.8.  Her liver function tests are elevated. She had mild anemia.  So far, tests have not been done.  She is on steroids.  When I saw her, she really cannot move her legs.  She had urinary retention.  A Foley  catheter has been placed.  She is cachectic.  She clearly is severely protein calorie malnourished. I will check a pre-albumin level on her.  She does have a Duragesic patch for pain.  She is not complaining too much in the way of pain right now.  She has lost quite a bit of weight.  It is hard to say how much weight she has lost.  Currently, her performance status is ECOG 3-4.  PAST MEDICAL HISTORY:  Remarkable for: 1. Hypertension. 2. GERD. 3. Pyelonephritis.  ALLERGIES:  None.  ADMISSION MEDICATIONS:  Flexeril 10 mg p.o. t.i.d. p.r.n., Decadron 8 mg p.o. b.i.d., Aromasin 25 mg p.o. daily, fentanyl patch 100 mcg to the skin q.3 days, Diflucan 100 mg p.o. daily, lactulose 10 g t.i.d., Ativan 1 mg p.o. q.6 hours p.r.n. for anxiety, Prilosec 40 mg p.o. daily, Zofran 4 mg q.4 hours p.r.n., MiraLax 17 g p.o. daily, Maxzide (37.5/25) 1 p.o. daily, and Senokot 2 and 4 pills p.o. b.i.d.  SOCIAL HISTORY:  Remarkable for past tobacco use.  She does have a past history of using recreational  drugs.  There is no alcohol use.  FAMILY HISTORY:  Noncontributory.  REVIEW OF SYSTEMS:  As in the history of present illness.  PHYSICAL EXAMINATION:  GENERAL:  This is a cachectic black female, in no obvious distress.  She is alert and oriented x3. VITAL SIGNS:  Temperature of 98, pulse 112, respiratory rate 14, and blood pressure 97/59. HEAD AND NECK:  Shows temporal muscle wasting.  She has no oral lesions. There is some questionable scleral icterus.  No adenopathy noted. LUNGS:  Clear bilaterally. CARDIAC:  Slight tachycardia, but regular.  She has no murmurs, rubs or bruits. ABDOMEN:  Distended.  Her liver is massive.  Liver extends about 10 cm below the right costal margin, extends across the midline.  No obvious ascites is noted. EXTREMITIES:  Show no movement in her lower legs.  She has muscle atrophy in upper and lower extremities.  She has good strength in her upper  extremities. SKIN:  Slightly dry. NEUROLOGICAL:  Shows the absence of movement of her lower extremities.  LABORATORY STUDIES:  White cell count 5.8, hemoglobin 8.5, hematocrit 26.4, and platelet count 314.  IMPRESSION:  [name] is a 48 year old African American female.  She has metastatic breast cancer.  It is clearly apparent that this is progressing.  She was on Aromasin.  There is absolutely no indication for continuing this.  Her performance status is poor.  I do not believe that she is a candidate for any systemic chemotherapy.  It looks like she has cord compression.  This can be evaluated with MRI. Unfortunately, nothing can be done to help this in my opinion.  She is not a surgical candidate.  She is not a candidate for any radiation therapy.  I just think that pursuing interventions as these would be futile.  I think that comfort care is appropriate for her.  She is being followed by hospice.  I spoke to her mother as husband and her father.  I explained to them what I thought was going on.  Her mother wants to take her home with her when she is ready to go home. I believe this is appropriate.  [name], in my mind, will not walk again.  I do not think she will be able to urinate.  She will need a chronic indwelling Foley catheter.  I feel bad for [name].  She seems very nice.  She just has a very aggressive cancer now.  She, in my mind, probably has less than 4-6 weeks.  We will see what her prealbumin is.  Continue with comfort measures.  This I think will be a priority.  We will certainly follow along.     Volanda Napoleon, M.D.     PRE/MEDQ  D:  07/22/2013  T:  07/22/2013  Job:  [id number]

## 2013-07-22 NOTE — Consult Note (Signed)
#    [id number] is consult note.  Pete E.  Hebrews 12:12

## 2013-07-23 DIAGNOSIS — C50919 Malignant neoplasm of unspecified site of unspecified female breast: Principal | ICD-10-CM

## 2013-07-23 DIAGNOSIS — R29898 Other symptoms and signs involving the musculoskeletal system: Secondary | ICD-10-CM

## 2013-07-23 DIAGNOSIS — C787 Secondary malignant neoplasm of liver and intrahepatic bile duct: Secondary | ICD-10-CM

## 2013-07-23 DIAGNOSIS — G8220 Paraplegia, unspecified: Secondary | ICD-10-CM

## 2013-07-23 DIAGNOSIS — R160 Hepatomegaly, not elsewhere classified: Secondary | ICD-10-CM

## 2013-07-23 LAB — CBC
HEMATOCRIT: 22.6 % — AB (ref 36.0–46.0)
Hemoglobin: 7.4 g/dL — ABNORMAL LOW (ref 12.0–15.0)
MCH: 28.4 pg (ref 26.0–34.0)
MCHC: 32.7 g/dL (ref 30.0–36.0)
MCV: 86.6 fL (ref 78.0–100.0)
Platelets: 295 10*3/uL (ref 150–400)
RBC: 2.61 MIL/uL — ABNORMAL LOW (ref 3.87–5.11)
RDW: 18.1 % — AB (ref 11.5–15.5)
WBC: 6 10*3/uL (ref 4.0–10.5)

## 2013-07-23 LAB — COMPREHENSIVE METABOLIC PANEL
ALBUMIN: 2.6 g/dL — AB (ref 3.5–5.2)
ALT: 37 U/L — ABNORMAL HIGH (ref 0–35)
AST: 102 U/L — ABNORMAL HIGH (ref 0–37)
Alkaline Phosphatase: 941 U/L — ABNORMAL HIGH (ref 39–117)
BILIRUBIN TOTAL: 0.7 mg/dL (ref 0.3–1.2)
BUN: 16 mg/dL (ref 6–23)
CO2: 26 mEq/L (ref 19–32)
CREATININE: 0.62 mg/dL (ref 0.50–1.10)
Calcium: 9 mg/dL (ref 8.4–10.5)
Chloride: 97 mEq/L (ref 96–112)
GFR calc Af Amer: >90 mL/min (ref >90)
GFR calc non Af Amer: >90 mL/min (ref >90)
Glucose, Bld: 140 mg/dL — ABNORMAL HIGH (ref 70–99)
Potassium: 4.4 mEq/L (ref 3.7–5.3)
Sodium: 135 mEq/L — ABNORMAL LOW (ref 137–147)
TOTAL PROTEIN: 6.6 g/dL (ref 6.0–8.3)

## 2013-07-23 LAB — PHOSPHORUS: Phosphorus: 2.3 mg/dL (ref 2.3–4.6)

## 2013-07-23 LAB — MAGNESIUM: Magnesium: 1.9 mg/dL (ref 1.5–2.5)

## 2013-07-23 MED ORDER — HYDROMORPHONE HCL PF 2 MG/ML IJ SOLN
4.0000 mg | INTRAMUSCULAR | Status: DC | PRN
  Administered 2013-07-23 (×2): 4 mg via INTRAVENOUS
  Filled 2013-07-23 (×2): qty 2

## 2013-07-23 MED ORDER — ENSURE COMPLETE PO LIQD
237.0000 mL | Freq: Three times a day (TID) | ORAL | Status: DC
  Administered 2013-07-23 – 2013-07-26 (×9): 237 mL via ORAL

## 2013-07-23 MED ORDER — LORAZEPAM 1 MG PO TABS
1.0000 mg | ORAL_TABLET | ORAL | Status: DC | PRN
  Administered 2013-07-24: 1 mg via ORAL
  Filled 2013-07-23: qty 1

## 2013-07-23 MED ORDER — MORPHINE SULFATE 30 MG PO TABS
30.0000 mg | ORAL_TABLET | ORAL | Status: DC | PRN
  Administered 2013-07-23 – 2013-07-25 (×6): 30 mg via ORAL
  Filled 2013-07-23 (×7): qty 1

## 2013-07-23 MED ORDER — HYDROMORPHONE HCL 4 MG PO TABS
8.0000 mg | ORAL_TABLET | ORAL | Status: DC | PRN
  Administered 2013-07-23: 8 mg via ORAL
  Filled 2013-07-23: qty 2

## 2013-07-23 MED ORDER — DEXAMETHASONE 4 MG PO TABS
8.0000 mg | ORAL_TABLET | Freq: Two times a day (BID) | ORAL | Status: DC
  Administered 2013-07-23 – 2013-07-24 (×3): 8 mg via ORAL
  Filled 2013-07-23 (×4): qty 2

## 2013-07-23 MED ORDER — DIPHENHYDRAMINE HCL 25 MG PO CAPS
25.0000 mg | ORAL_CAPSULE | Freq: Four times a day (QID) | ORAL | Status: DC | PRN
  Administered 2013-07-23: 25 mg via ORAL
  Filled 2013-07-23: qty 1

## 2013-07-23 NOTE — Progress Notes (Signed)
DNR.  Related admission.  Patient sitting up in bed eating lunch.  No issues with pain noted at this time.  Questioned patient about discharge plans home and she states she will be returning to her home with her daughter.  No orders for discharge at this time.  Please contact [facility] at time of discharge.  Vance Gather, RN [facility]

## 2013-07-23 NOTE — Progress Notes (Signed)
[facility] PROGRESS NOTE  [name] OHY:[id number] DOB: 1965-11-05 DOA: 07/22/2013 PCP: Pcp Not In System  Assessment/Plan  Paraplegia - suspicious for subacute cord compression but duration of symptoms unclear likely days, patient is interested in studies being done despite being on hospice at this time.  -  MRI thoracic spine pending -  Possible paliative radiation -  Decadron 8mg  BID  -  Hospice following.  -  I am concerned that the patient wants to be discharged to the care of her daughter who goes to work for at least 8 hours per day, and the patient is unable to get up without assistance. Social work and hospice to please continue to address.   . Protein-calorie malnutrition, severe  - nutrition assistance appreciated  -  Continue dietary supplements   . 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, brain and bone will discuss with oncology on call to clarify plan.  - Appreciate  Dr Marin Olp assistance   . Urinary retention -anticipate chronic foley  Normocytic anemia, may have been hemodilution -  Repeat CBC in a.m.  -  Patient would like blood transfusion if indicated   Diet:  Regular  Access:  Peripheral IV  IVF:  Off  Proph:  SCDs  Code Status: DO NOT RESUSCITATE  Family Communication: Patient alone  Disposition Plan: Pending completion of thoracic MRI, likely home tomorrow with hospice   Consultants:  Hospice  Oncology  Procedures:  X-ray spine  MRI brain  MRI lumbar spine  MRI thoracic spine pending  Antibiotics:  None   HPI/Subjective:  Patient states she has had no improvement in her lower extremity strength, she asked to lift up her legs and her arms to adjust them.    Objective: Filed Vitals:   07/22/13 0630 07/22/13 1347 07/22/13 2210 07/23/13 0552  BP: 97/59 104/55 111/62 113/63  Pulse: 112 107 99 102  Temp: 98 F (36.7 C) 97.2 F (36.2 C) 98.1 F (36.7 C) 98.2 F (36.8 C)  TempSrc: Oral Oral Oral Oral  Resp: 14 18 18 20    Height: 5\' 2"  (1.575 m)     Weight: 44.3 kg (97 lb 10.6 oz)   45.677 kg (100 lb 11.2 oz)  SpO2: 100% 97% 99% 100%    Intake/Output Summary (Last 24 hours) at 07/23/13 1418 Last data filed at 07/22/13 2326  Gross per 24 hour  Intake    680 ml  Output    600 ml  Net     80 ml   Filed Weights   07/22/13 0630 07/23/13 0552  Weight: 44.3 kg (97 lb 10.6 oz) 45.677 kg (100 lb 11.2 oz)    Exam:   General:  Cachectic BF, No acute distress  HEENT:  NCAT, MMM  Cardiovascular:  RRR, nl S1, S2 no mrg, 2+ pulses, warm extremities  Respiratory:  CTAB, no increased WOB  Abdomen:   NABS, soft, NT/ND  MSK:    decreased tone and bulk in bilateral lower extremities, no LEE  Neuro:   disconjugate gaze, no facial asymmetry, upper extremities 5 out of 5 with sensation intact, bilateral lower extremities 2/5, sensation intact, diminished or absent patellar reflexes  Data Reviewed: Basic Metabolic Panel:  Recent Labs Lab 07/18/13 0720 07/22/13 0215 07/23/13 0637  NA 136* 135* 135*  K 4.9 4.4 4.4  CL 97 95* 97  CO2 26 24 26   GLUCOSE 104* 137* 140*  BUN 10 14 16   CREATININE 0.55 0.67 0.62  CALCIUM 9.6 9.9 9.0  MG  --   --  1.9  PHOS  --   --  2.3   Liver Function Tests:  Recent Labs Lab 07/22/13 0215 07/23/13 0637  AST 84* 102*  ALT 28 37*  ALKPHOS 958* 941*  BILITOT 0.8 0.7  PROT 6.9 6.6  ALBUMIN 2.8* 2.6*   No results found for this basename: LIPASE, AMYLASE,  in the last 168 hours No results found for this basename: AMMONIA,  in the last 168 hours CBC:  Recent Labs Lab 07/18/13 0720 07/22/13 0215 07/23/13 0637  WBC 4.8 5.8 6.0  NEUTROABS 3.7  --   --   HGB 8.8* 8.5* 7.4*  HCT 26.5* 26.4* 22.6*  MCV 84.9 86.8 86.6  PLT 353 314 295   Cardiac Enzymes:  Recent Labs Lab 07/22/13 0215  CKTOTAL 88   BNP (last 3 results) No results found for this basename: PROBNP,  in the last 8760 hours CBG: No results found for this basename: GLUCAP,  in the last 168  hours  No results found for this or any previous visit (from the past 240 hour(s)).   Studies: Dg Lumbar Spine Complete  07/22/2013   CLINICAL DATA:  Back pain.  Metastatic breast cancer.  EXAM: LUMBAR SPINE - COMPLETE 4+ VIEW  COMPARISON:  DG ABD ACUTE W/CHEST dated 06/25/2013; MR ABDOMEN WO/W CM dated 10/28/2012  FINDINGS: Innumerable calcific densities noted over the liver consistent with known metastatic disease. Lytic lesion is noted of the right eleventh rib consistent with metastatic disease. Sclerosis noted in the right tenth rib and mild multifocal sclerosis noted over the lumbar spine, sacrum, and iliac crests suggesting metastatic disease. No acute lumbar spine fracture. Calcification within the pelvis most consistent with a fibroid.  IMPRESSION: 1. Multiple calcific densities projected over the liver consistent with known hepatic metastatic disease. 2. Lytic lesion right eleventh rib consistent wake metastatic disease. Sclerotic lesion right tenth rib consistent with metastatic disease. Subtle sclerosis noted lumbar spine and in the pelvis suggesting metastatic disease to these regions. No evidence of lumbar spine fracture.   Electronically Signed   By: Marcello Moores  Register   On: 07/22/2013 02:40   Mr Jeri Cos Wo Contrast  07/22/2013   CLINICAL DATA:  Metastatic breast cancer. Bilateral leg weakness. Rule out metastatic disease.  EXAM: MRI HEAD WITHOUT AND WITH CONTRAST  TECHNIQUE: Multiplanar, multiecho pulse sequences of the brain and surrounding structures were obtained without and with intravenous contrast.  CONTRAST:  21mL MULTIHANCE GADOBENATE DIMEGLUMINE 529 MG/ML IV SOLN  COMPARISON:  MRI 03/21/2013  FINDINGS: Expansile mass in the clivus has progressed and is compatible with metastatic disease. New area of metastatic disease in the C2 vertebral body on the right. No epidural tumor in the spinal canal. C4 and C5 not imaged on today's study. Calvarium remains heterogeneous which could represent  bony metastatic disease.  Negative for acute infarct. Hyperintensity in the pons has progressed and may be due to chemotherapy or radiation. Small white matter hyperintensities in the frontal lobes are present which are unchanged.  Negative for mass or edema. Postcontrast imaging reveals normal enhancement. The patient did not hold still for the postcontrast images which could obscure small enhancing lesions.  IMPRESSION: Metastatic disease to the clivus has progressed. New area of metastatic disease in the C2 vertebral body.  Negative for brain metastasis.   Electronically Signed   By: Franchot Gallo M.D.   On: 07/22/2013 10:49   Mr Lumbar Spine W Wo Contrast  07/22/2013   CLINICAL DATA:  New onset of bilateral leg  weakness and numbness. Known metastatic breast cancer. Paraplegia.  EXAM: MRI LUMBAR SPINE WITHOUT AND WITH CONTRAST  TECHNIQUE: Multiplanar and multiecho pulse sequences of the lumbar spine were obtained without and with intravenous contrast.  CONTRAST:  8 cc MultiHance  COMPARISON:  Radiographs dated 07/22/2013 and MRI dated 10/26/2012  FINDINGS: Tip of the conus is at L1. The patient has extensive metastatic disease in the liver, markedly progressed since the prior study with inferior displacement of the right kidney.  There is increased tumor in the posterior elements of T12 which compresses the posterior aspect of the thecal sac but does not compress the spinal cord. Tumor extends into the right neural foramen at L1-2 but this is unchanged. The tumor surrounding the thecal sac at L1 seen on the prior study has diminished.  There are numerous metastatic lesions throughout the lumbar spine but there is no other extension of tumor into the spinal canal. There are no pathologic fractures. There are numerous metastatic lesions in the pelvic bones and sacrum. Those lesions have progressed.  IMPRESSION: 1. Decreased tumor surrounding the thecal sac at L1 since the prior study. 2. Increased tumor in the  posterior elements of T12 slightly compressing the posterior aspect of the thecal sac but without focal neural impingement. 3. Marked progression of metastatic disease in the liver with increased metastases in the spine and sacrum and pelvic bones. 4. No neural impingement to explain the patient's urinary retention and right leg weakness.   Electronically Signed   By: Rozetta Nunnery M.D.   On: 07/22/2013 10:46    Scheduled Meds: . dexamethasone  8 mg Oral BID  . feeding supplement (ENSURE COMPLETE)  237 mL Oral TID BM  . fentaNYL  100 mcg Transdermal Q72H  . fluconazole  100 mg Oral Daily  . lactulose  10 g Oral TID  . pantoprazole  40 mg Oral Daily  . sodium chloride  3 mL Intravenous Q12H   Continuous Infusions: . sodium chloride 50 mL/hr at 07/22/13 2326    Active Problems:   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, brain and bone   Protein-calorie malnutrition, severe   Urinary retention   Paraplegia    Time spent: 30 min    Sorin Frimpong, [facility] Pager [phone]. If 7PM-7AM, please contact night-coverage at [email or web address], password [email or web address] 07/23/2013, 2:18 PM  LOS: 1 day

## 2013-07-23 NOTE — Progress Notes (Signed)
INITIAL NUTRITION ASSESSMENT  DOCUMENTATION CODES Per approved criteria  -Severe malnutrition in the context of chronic illness  Pt meets criteria for severe MALNUTRITION in the context of chronic illness as evidenced by 17% wt loss in <6 months and reported intake meeting <75% of estimated needs for >1 month.  INTERVENTION: Ensure Complete po TID, each supplement provides 350 kcal and 13 grams of protein  NUTRITION DIAGNOSIS: Inadequate oral intake related to breast cancer as evidenced by wt loss.   Goal: Pt to meet >/= 90% of their estimated nutrition needs   Monitor:  Wt, po intake, acceptance of supplements, bowel movements  Reason for Assessment: Consult  48 y.o. female  Admitting Dx: <principal problem not specified>  ASSESSMENT: 48 y.o. female has a past medical history of Hypertension; GERD ; Pyelonephritis; Recurrent UTI; S/P chemotherapy; S/P radiation; Hypercalcemia; Bone metastases; 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; Brain cancer; and Hospice care patient. Admitted for trouble with right leg weakness for past few days and urinary retention for the past 24-48 hours.   Pt is a hospice pt. She reported that she has had a better appetite since being in the hospital. She says that she had been eating very little for several months prior to admission. RD provided pt with a chocolate Ensure Complete drink.   Height: Ht Readings from Last 1 Encounters:  07/22/13 5\' 2"  (1.575 m)    Weight: Wt Readings from Last 1 Encounters:  07/23/13 100 lb 11.2 oz (45.677 kg)    Ideal Body Weight: 50.1 kg  % Ideal Body Weight: 91%  Wt Readings from Last 10 Encounters:  07/23/13 100 lb 11.2 oz (45.677 kg)  07/04/13 96 lb (43.545 kg)  07/04/13 96 lb 3.2 oz (43.636 kg)  06/29/13 101 lb (45.813 kg)  01/26/13 121 lb 6.4 oz (55.067 kg)  01/22/13 124 lb (56.246 kg)  01/11/13 129 lb 3.2 oz (58.605 kg)  01/05/13 131 lb 3.2 oz (59.512 kg)  01/05/13 131 lb 3.2 oz (59.512 kg)   12/14/12 119 lb 14.4 oz (54.386 kg)    Usual Body Weight: 120 lbs  % Usual Body Weight: 83%  BMI:  Body mass index is 18.41 kg/(m^2).  Estimated Nutritional Needs: Kcal: 1400-1600 Protein: 70-80 g Fluid: >1.6 L  Skin: WNL  Diet Order: General  EDUCATION NEEDS: -No education needs identified at this time   Intake/Output Summary (Last 24 hours) at 07/23/13 1001 Last data filed at 07/22/13 2326  Gross per 24 hour  Intake 976.25 ml  Output   1150 ml  Net -173.75 ml    Last BM: none recorded   Labs:   Recent Labs Lab 07/18/13 0720 07/22/13 0215 07/23/13 0637  NA 136* 135* 135*  K 4.9 4.4 4.4  CL 97 95* 97  CO2 26 24 26   BUN 10 14 16   CREATININE 0.55 0.67 0.62  CALCIUM 9.6 9.9 9.0  MG  --   --  1.9  PHOS  --   --  2.3  GLUCOSE 104* 137* 140*    CBG (last 3)  No results found for this basename: GLUCAP,  in the last 72 hours  Scheduled Meds: . dexamethasone  8 mg Oral BID  . fentaNYL  100 mcg Transdermal Q72H  . fluconazole  100 mg Oral Daily  . lactulose  10 g Oral TID  . pantoprazole  40 mg Oral Daily  . sodium chloride  3 mL Intravenous Q12H    Continuous Infusions: . sodium chloride 50 mL/hr at 07/22/13  2326    Past Medical History  Diagnosis Date  . Hypertension   . GERD (gastroesophageal reflux disease)   . Pyelonephritis 10/01/11  . Recurrent UTI (urinary tract infection) 10/01/11  . S/P chemotherapy, time since 4-12 weeks   . S/P radiation > 12 weeks 03/15/12- 04/07/12    right hip, clivus  . Hypercalcemia 06/21/2012  . History of radiation therapy 08/06/10 -09/16/10    R supraclav fossa, R chest wall, R drain site  . radiaiton therapy 07/22/12 -08/15/12    T4-T7  . 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 Brain cancer     spread from breast ca  . Hospice care patient 11/09/2012    [facility] [place]-call [phone]    Past Surgical History   Procedure Laterality Date  . Cesarean section  1989; 2010  . Mastectomy  2011    right  . Back surgery  2012    "removed tumor,cancer, from my spine  . Port-a-cath removal  2012    right chest  . Portacath placement  2011    right  . Tubal ligation  2010  . Breast biopsy  2011    right  . Hip arthroplasty  05/04/2012    Procedure: ARTHROPLASTY BIPOLAR HIP;  Surgeon: Mauri Pole, MD;  Location: [facility];  Service: Orthopedics;  Laterality: Right;  RIGHT HIP HEMI ARTHROPLASTY     Terrace Arabia RD, LDN

## 2013-07-23 NOTE — Progress Notes (Signed)
  Ms. Benfer seems more comfortable. She had MRI. It shows increased tumor burden that T. 12. There is no obvious cord compression. I will get a MRI of the thoracic spine. I think that whatever is found, therapy really cannot help her. She's not a candidate for chemotherapy. She has already had radiation therapy. She has a lot of tumor progression in her liver.  Her prealbumin is only 10. This is an ominous factor.  She probably needs an air mattress as she really won't be able to get out of bed.  She is eating well. This makes her very happy.  Is hard to assess her pain issues. She is on Duragesic patch. She's getting Dilaudid when necessary. She is on Decadron.  Her vital signs look okay. Blood pressure 113/63. Temperature 98.2.  Her hemoglobin 7.4. Am not sure why this is low. One has to suspect possible marrow involvement by breast cancer. I do see where she is at having any bleeding. Platelet count is 295.  Her lungs are clear. Cardiac exam regular in rhythm. Abdomen distended secondary to extensive hepatomegaly. Extremities shows minimal movement of her left foot.  She would go into her mother's house to stay when she is ready for discharge. I spoke to her family yesterday. They feel that it would be best for her to be at her mom's house. He  Hospice continue to follow.  The hemoglobin we'll half to be followed closely.  Pete E.  Psalm 27:1

## 2013-07-23 NOTE — Progress Notes (Signed)
07/23/2013 0945 Scheduled dc home with [facility]. Will notify Hospice at time of dc and fax any needed orders. Jonnie Finner RN [facility]CM Case Mgmt phone [phone]

## 2013-07-24 ENCOUNTER — Inpatient Hospital Stay (HOSPITAL_COMMUNITY)

## 2013-07-24 DIAGNOSIS — R748 Abnormal levels of other serum enzymes: Secondary | ICD-10-CM

## 2013-07-24 LAB — BASIC METABOLIC PANEL
BUN: 18 mg/dL (ref 6–23)
CHLORIDE: 98 meq/L (ref 96–112)
CO2: 24 meq/L (ref 19–32)
CREATININE: 0.62 mg/dL (ref 0.50–1.10)
Calcium: 9.4 mg/dL (ref 8.4–10.5)
GFR calc Af Amer: >90 mL/min (ref >90)
GFR calc non Af Amer: >90 mL/min (ref >90)
GLUCOSE: 135 mg/dL — AB (ref 70–99)
Potassium: 4.6 mEq/L (ref 3.7–5.3)
Sodium: 135 mEq/L — ABNORMAL LOW (ref 137–147)

## 2013-07-24 LAB — CBC WITH DIFFERENTIAL/PLATELET
Basophils Absolute: 0 10*3/uL (ref 0.0–0.1)
Basophils Relative: 0 % (ref 0–1)
Eosinophils Absolute: 0 10*3/uL (ref 0.0–0.7)
Eosinophils Relative: 0 % (ref 0–5)
HEMATOCRIT: 24.9 % — AB (ref 36.0–46.0)
HEMOGLOBIN: 8 g/dL — AB (ref 12.0–15.0)
LYMPHS PCT: 10 % — AB (ref 12–46)
Lymphs Abs: 0.7 10*3/uL (ref 0.7–4.0)
MCH: 28.3 pg (ref 26.0–34.0)
MCHC: 32.1 g/dL (ref 30.0–36.0)
MCV: 88 fL (ref 78.0–100.0)
MONO ABS: 0.2 10*3/uL (ref 0.1–1.0)
MONOS PCT: 3 % (ref 3–12)
Neutro Abs: 5.6 10*3/uL (ref 1.7–7.7)
Neutrophils Relative %: 87 % — ABNORMAL HIGH (ref 43–77)
Platelets: 341 10*3/uL (ref 150–400)
RBC: 2.83 MIL/uL — ABNORMAL LOW (ref 3.87–5.11)
RDW: 18.2 % — ABNORMAL HIGH (ref 11.5–15.5)
WBC: 6.5 10*3/uL (ref 4.0–10.5)

## 2013-07-24 MED ORDER — MORPHINE BOLUS VIA INFUSION
2.0000 mg | INTRAVENOUS | Status: DC | PRN
  Administered 2013-07-25: 2 mg via INTRAVENOUS
  Administered 2013-07-25: 4 mg via INTRAVENOUS
  Administered 2013-07-25: 2 mg via INTRAVENOUS
  Filled 2013-07-24: qty 4

## 2013-07-24 MED ORDER — VITAMINS A & D EX OINT
TOPICAL_OINTMENT | CUTANEOUS | Status: AC
  Administered 2013-07-24: 5
  Filled 2013-07-24: qty 5

## 2013-07-24 MED ORDER — GADOBENATE DIMEGLUMINE 529 MG/ML IV SOLN
10.0000 mL | Freq: Once | INTRAVENOUS | Status: AC | PRN
Start: 2013-07-24 — End: 2013-07-24
  Administered 2013-07-24: 10 mL via INTRAVENOUS

## 2013-07-24 MED ORDER — FENTANYL 100 MCG/HR TD PT72
100.0000 ug | MEDICATED_PATCH | TRANSDERMAL | Status: DC
  Administered 2013-07-24: 100 ug via TRANSDERMAL
  Filled 2013-07-24: qty 1

## 2013-07-24 MED ORDER — DEXAMETHASONE 6 MG PO TABS
6.0000 mg | ORAL_TABLET | Freq: Four times a day (QID) | ORAL | Status: DC
  Filled 2013-07-24 (×7): qty 1

## 2013-07-24 MED ORDER — MORPHINE SULFATE 4 MG/ML IJ SOLN: 2.0000 mg | INTRAMUSCULAR | Status: DC | PRN

## 2013-07-24 MED ORDER — MORPHINE SULFATE 10 MG/ML IJ SOLN
2.0000 mg/h | INTRAVENOUS | Status: DC
  Administered 2013-07-24: 2 mg/h via INTRAVENOUS
  Filled 2013-07-24: qty 10

## 2013-07-24 MED ORDER — MORPHINE SULFATE 10 MG/ML IJ SOLN
2.0000 mg/h | INTRAMUSCULAR | Status: DC
  Filled 2013-07-24: qty 10

## 2013-07-24 NOTE — Progress Notes (Addendum)
Patient ID: [name], female   DOB: 1966/03/14, 48 y.o.   MRN: [id number]  [facility] PROGRESS NOTE  KARENE BRACKEN INO:[id number] DOB: January 02, 1966 DOA: 07/22/2013 PCP: Pcp Not In System  Brief narrative: 48 y.o. 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 This was associated with poor oral intake, malaise, no urine output over the past 24 hours PTA. Pt explains she was on decadron but has stopped taking it as she was having hallucinations. Of note, pt has hx of spinal cord compression treated with laminectomy and radiation therapy. In ED, it was noted that bladder scan with > 800 cc of urine PVR, foley placed and [facility] asked to admit for further evaluation.   Assessment/Plan  Paraplegia  - relatively unclear etiology, no specific cord compression in the lumbar spine but noted at T4-T5 level  - radiation oncology consulted, Dr. Isidore Moos will plan on seeing in the AM - in the meantime I will ask Neurosurgery if they have any additional recommendations  - continue Decadron but will increase the frequency from BID to QID and will lower the dose from 8 mg to 6 mg Protein-calorie malnutrition, severe  - nutrition assistance appreciated  - Continue dietary supplements  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, brain and bone - Appreciate Dr Marin Olp assistance  Urinary retention  - anticipate need for chronic foley  Normocytic anemia - Hg overall stable, no signs of active bleeding   Diet: Regular  Access: Peripheral IV  IVF: Off  Proph: SCDs   Code Status: DO NOT RESUSCITATE  Family Communication: Patient alone  Disposition Plan:  likely home with hospice when medically ready   Consultants:  Hospice  Oncology Procedures:  MRI lumbar spine  07/22/13 - Decreased tumor surrounding the thecal sac at L1 since the prior study.  Increased tumor in the posterior elements of T12 slightly compressing the posterior aspect of the thecal sac but without focal neural impingement. Marked progression of metastatic disease in the liver with increased metastases in the spine and sacrum and pelvic bones. No neural impingement to explain the patient's urinary retention and right leg weakness.  Mr Thoracic Spine  07/24/13  - Progressive epidural tumor and osseous metastatic disease since the prior MRI. There is significant compression of the thoracic cord at the T4 and T5 levels with edema like cord signal abnormality just above the tumor. Antibiotics:  None   HPI/Subjective: No events overnight.   Objective: Filed Vitals:   07/22/13 2210 07/23/13 0552 07/23/13 2231 07/24/13 0500  BP:  113/63 113/70 134/74  Pulse: 99 102 98 117  Temp: 98.1 F (36.7 C) 98.2 F (36.8 C) 98.4 F (36.9 C) 98.5 F (36.9 C)  TempSrc: Oral Oral Oral Oral  Resp: 18 20 20 20   Height:      Weight:  45.677 kg (100 lb 11.2 oz)  45.6 kg (100 lb 8.5 oz)  SpO2: 99% 100% 96% 100%    Intake/Output Summary (Last 24 hours) at 07/24/13 1626 Last data filed at 07/24/13 1200  Gross per 24 hour  Intake    240 ml  Output   2800 ml  Net  -2560 ml   Exam:   General:  Pt is alert, follows commands appropriately, not in acute distress  Cardiovascular: Regular rhythm, tachycardic, S1/S2, no murmurs, no rubs, no gallops  Respiratory: Clear to auscultation  bilaterally, no wheezing, no crackles, no rhonchi  Abdomen: Soft, non tender, non distended, bowel sounds present, no guarding  Extremities: No edema, pulses DP and PT palpable bilaterally, unable to mover LE   Data Reviewed: Basic Metabolic Panel:  Recent Labs Lab 07/18/13 0720 07/22/13 0215 07/23/13 0637 07/24/13 0348  NA 136* 135* 135* 135*  K 4.9 4.4 4.4 4.6  CL 97 95* 97 98  CO2 26 24 26 24   GLUCOSE 104* 137* 140* 135*  BUN 10 14 16 18   CREATININE 0.55 0.67 0.62 0.62  CALCIUM 9.6 9.9  9.0 9.4  MG  --   --  1.9  --   PHOS  --   --  2.3  --    Liver Function Tests:  Recent Labs Lab 07/22/13 0215 07/23/13 0637  AST 84* 102*  ALT 28 37*  ALKPHOS 958* 941*  BILITOT 0.8 0.7  PROT 6.9 6.6  ALBUMIN 2.8* 2.6*   CBC:  Recent Labs Lab 07/18/13 0720 07/22/13 0215 07/23/13 0637 07/24/13 0348  WBC 4.8 5.8 6.0 6.5  NEUTROABS 3.7  --   --  5.6  HGB 8.8* 8.5* 7.4* 8.0*  HCT 26.5* 26.4* 22.6* 24.9*  MCV 84.9 86.8 86.6 88.0  PLT 353 314 295 341   Cardiac Enzymes:  Recent Labs Lab 07/22/13 0215  CKTOTAL 88   Scheduled Meds: . dexamethasone  8 mg Oral BID  . feeding supplement (ENSURE COMPLETE)  237 mL Oral TID BM  . fentaNYL  100 mcg Transdermal Q72H  . fluconazole  100 mg Oral Daily  . lactulose  10 g Oral TID  . pantoprazole  40 mg Oral Daily  . sodium chloride  3 mL Intravenous Q12H   Continuous Infusions: . sodium chloride 10 mL/hr at 07/23/13 1458   Faye Ramsay, MD  [facility] Pager [phone]  If 7PM-7AM, please contact night-coverage [email or web address] Password [facility] 07/24/2013, 4:26 PM   LOS: 2 days

## 2013-07-24 NOTE — Consult Note (Signed)
Reason for Consult: Spinal metastasis, paraplegia Referring Physician: Dr. Tanna Savoy is an 48 y.o. female.  HPI:  The patient is a 48 year old black female with a history of end-stage widely metastatic breast cancer. She's had known spinal metastasis. Infectious had spine surgery in 2012. The patient can't remember who did that surgery. The patient has become progressively paraplegic. Spinal MRIs have demonstrated significant upper thoracic metastasis and spinal cord compression. A neurosurgical consultation was requested.  Pleasantly the patient is alert and pleasant. She understands her situation. She is not interested in surgery.  Past Medical History  Diagnosis Date  . Hypertension   . GERD (gastroesophageal reflux disease)   . Pyelonephritis 10/01/11  . Recurrent UTI (urinary tract infection) 10/01/11  . S/P chemotherapy, time since 4-12 weeks   . S/P radiation > 12 weeks 03/15/12- 04/07/12    right hip, clivus  . Hypercalcemia 06/21/2012  . History of radiation therapy 08/06/10 -09/16/10    R supraclav fossa, R chest wall, R drain site  . radiaiton therapy 07/22/12 -08/15/12    T4-T7  . 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 Brain cancer     spread from breast ca  . Hospice care patient 11/09/2012    [name] [place]-call [phone]    Past Surgical History  Procedure Laterality Date  . Cesarean section  1989; 2010  . Mastectomy  2011    right  . Back surgery  2012    "removed tumor,cancer, from my spine  . Port-a-cath removal  2012    right chest  . Portacath placement  2011    right  . Tubal ligation  2010  . Breast biopsy  2011    right  . Hip arthroplasty  05/04/2012    Procedure: ARTHROPLASTY BIPOLAR HIP;  Surgeon: Mauri Pole, MD;  Location: [facility];  Service: Orthopedics;  Laterality: Right;  RIGHT HIP HEMI ARTHROPLASTY     Family History  Problem  Relation Age of Onset  . Cancer Brother 47    died of colon cancer at age of 50  . High blood pressure Mother     Social History:  reports that she quit smoking about 14 months ago. Her smoking use included Cigarettes. She has a 7.75 pack-year smoking history. She has never used smokeless tobacco. She reports that she uses illicit drugs ("Crack" cocaine and Marijuana). She reports that she does not drink alcohol.  Allergies: No Known Allergies  Medications:  I have reviewed the patient's current medications. Prior to Admission:  Prescriptions prior to admission  Medication Sig Dispense Refill  . cyclobenzaprine (FLEXERIL) 10 MG tablet Take 1 tablet (10 mg total) by mouth 3 (three) times daily as needed for muscle spasms.  60 tablet  2  . dexamethasone (DECADRON) 4 MG tablet Take 8 mg by mouth 2 (two) times daily with a meal.      . diphenhydrAMINE (BENADRYL) 25 mg capsule Take 1 capsule (25 mg total) by mouth every 6 (six) hours as needed for itching.  30 capsule  0  . exemestane (AROMASIN) 25 MG tablet Take 1 tablet (25 mg total) by mouth daily after breakfast.  30 tablet  3  . fentaNYL (DURAGESIC - DOSED MCG/HR) 100 MCG/HR Place 100 mcg onto the skin every 3 (three) days.      . fluconazole (DIFLUCAN) 100 MG tablet  Take 100 mg by mouth daily.      [name] guaiFENesin (ROBITUSSIN) 100 MG/5ML SOLN Take 5 mLs (100 mg total) by mouth every 4 (four) hours as needed for cough or to loosen phlegm.  1200 mL  0  . lactulose (CEPHULAC) 10 G packet Take 1 packet (10 g total) by mouth 3 (three) times daily.  30 each  0  . LORazepam (ATIVAN) 1 MG tablet Take 1 tablet (1 mg total) by mouth every 6 (six) hours as needed for anxiety.  30 tablet  0  . omeprazole (PRILOSEC) 40 MG capsule Take 1 capsule (40 mg total) by mouth daily.  30 capsule  3  . ondansetron (ZOFRAN-ODT) 4 MG disintegrating tablet Take 4 mg by mouth every 4 (four) hours as needed for nausea or vomiting.      . polyethylene glycol (MIRALAX /  GLYCOLAX) packet Take 17 g by mouth daily.  14 each  2  . promethazine (PHENERGAN) 25 MG tablet Take 1 tablet (25 mg total) by mouth every 6 (six) hours as needed for nausea.  30 tablet  1  . senna-docusate (SENOKOT-S) 8.6-50 MG per tablet Take 2-4 tablets by mouth 2 (two) times daily.      [name] triamterene-hydrochlorothiazide (DYAZIDE) 37.5-25 MG per capsule Take 1 each (1 capsule total) by mouth every morning.  30 capsule  12   Scheduled: . dexamethasone  6 mg Oral 4 times per day  . feeding supplement (ENSURE COMPLETE)  237 mL Oral TID BM  . fentaNYL  100 mcg Transdermal Q72H  . fluconazole  100 mg Oral Daily  . lactulose  10 g Oral TID  . pantoprazole  40 mg Oral Daily  . sodium chloride  3 mL Intravenous Q12H   Continuous: . sodium chloride 10 mL/hr at 07/23/13 1458  . morphine 2 mg/hr (07/24/13 1859)   PPI:RJJOACZYSAYTK, acetaminophen, cyclobenzaprine, diphenhydrAMINE, LORazepam, morphine, morphine, ondansetron (ZOFRAN) IV, ondansetron Anti-infectives   Start     Dose/Rate Route Frequency Ordered Stop   07/22/13 1000  fluconazole (DIFLUCAN) tablet 100 mg     100 mg Oral Daily 07/22/13 0624         Results for orders placed during the hospital encounter of 07/22/13 (from the past 48 hour(s))  MAGNESIUM     Status: None   Collection Time    07/23/13  6:37 AM      Result Value Ref Range   Magnesium 1.9  1.5 - 2.5 mg/dL  PHOSPHORUS     Status: None   Collection Time    07/23/13  6:37 AM      Result Value Ref Range   Phosphorus 2.3  2.3 - 4.6 mg/dL  CBC     Status: Abnormal   Collection Time    07/23/13  6:37 AM      Result Value Ref Range   WBC 6.0  4.0 - 10.5 K/uL   RBC 2.61 (*) 3.87 - 5.11 MIL/uL   Hemoglobin 7.4 (*) 12.0 - 15.0 g/dL   HCT 22.6 (*) 36.0 - 46.0 %   MCV 86.6  78.0 - 100.0 fL   MCH 28.4  26.0 - 34.0 pg   MCHC 32.7  30.0 - 36.0 g/dL   RDW 18.1 (*) 11.5 - 15.5 %   Platelets 295  150 - 400 K/uL  COMPREHENSIVE METABOLIC PANEL     Status: Abnormal    Collection Time    07/23/13  6:37 AM      Result Value Ref  Range   Sodium 135 (*) 137 - 147 mEq/L   Potassium 4.4  3.7 - 5.3 mEq/L   Chloride 97  96 - 112 mEq/L   CO2 26  19 - 32 mEq/L   Glucose, Bld 140 (*) 70 - 99 mg/dL   BUN 16  6 - 23 mg/dL   Creatinine, Ser 0.62  0.50 - 1.10 mg/dL   Calcium 9.0  8.4 - 10.5 mg/dL   Total Protein 6.6  6.0 - 8.3 g/dL   Albumin 2.6 (*) 3.5 - 5.2 g/dL   AST 102 (*) 0 - 37 U/L   ALT 37 (*) 0 - 35 U/L   Alkaline Phosphatase 941 (*) 39 - 117 U/L   Total Bilirubin 0.7  0.3 - 1.2 mg/dL   GFR calc non Af Amer >90  >90 mL/min   GFR calc Af Amer >90  >90 mL/min   Comment: (NOTE)     The eGFR has been calculated using the CKD EPI equation.     This calculation has not been validated in all clinical situations.     eGFR's persistently <90 mL/min signify possible Chronic Kidney     Disease.  CBC WITH DIFFERENTIAL     Status: Abnormal   Collection Time    07/24/13  3:48 AM      Result Value Ref Range   WBC 6.5  4.0 - 10.5 K/uL   RBC 2.83 (*) 3.87 - 5.11 MIL/uL   Hemoglobin 8.0 (*) 12.0 - 15.0 g/dL   HCT 24.9 (*) 36.0 - 46.0 %   MCV 88.0  78.0 - 100.0 fL   MCH 28.3  26.0 - 34.0 pg   MCHC 32.1  30.0 - 36.0 g/dL   RDW 18.2 (*) 11.5 - 15.5 %   Platelets 341  150 - 400 K/uL   Neutrophils Relative % 87 (*) 43 - 77 %   Neutro Abs 5.6  1.7 - 7.7 K/uL   Lymphocytes Relative 10 (*) 12 - 46 %   Lymphs Abs 0.7  0.7 - 4.0 K/uL   Monocytes Relative 3  3 - 12 %   Monocytes Absolute 0.2  0.1 - 1.0 K/uL   Eosinophils Relative 0  0 - 5 %   Eosinophils Absolute 0.0  0.0 - 0.7 K/uL   Basophils Relative 0  0 - 1 %   Basophils Absolute 0.0  0.0 - 0.1 K/uL  BASIC METABOLIC PANEL     Status: Abnormal   Collection Time    07/24/13  3:48 AM      Result Value Ref Range   Sodium 135 (*) 137 - 147 mEq/L   Potassium 4.6  3.7 - 5.3 mEq/L   Chloride 98  96 - 112 mEq/L   CO2 24  19 - 32 mEq/L   Glucose, Bld 135 (*) 70 - 99 mg/dL   BUN 18  6 - 23 mg/dL   Creatinine, Ser  0.62  0.50 - 1.10 mg/dL   Calcium 9.4  8.4 - 10.5 mg/dL   GFR calc non Af Amer >90  >90 mL/min   GFR calc Af Amer >90  >90 mL/min   Comment: (NOTE)     The eGFR has been calculated using the CKD EPI equation.     This calculation has not been validated in all clinical situations.     eGFR's persistently <90 mL/min signify possible Chronic Kidney     Disease.    Mr Thoracic Spine W Wo Contrast  07/24/2013  CLINICAL DATA:  Metastatic breast cancer.  EXAM: MRI THORACIC SPINE WITHOUT AND WITH CONTRAST  TECHNIQUE: Multiplanar and multiecho pulse sequences of the thoracic spine were obtained without and with intravenous contrast.  CONTRAST:  39m MULTIHANCE GADOBENATE DIMEGLUMINE 529 MG/ML IV SOLN  COMPARISON:  10/26/2012  FINDINGS: Extensive osseous metastatic disease is again demonstrated throughout the thoracic and lumbar spines. There is also extensive rib disease and extensive intra-abdominal tumor/metastatic liver disease.  There is new epidural tumor on the left at T2-3 also involving the left nerve foramen.  There is extensive epidural tumor beginning at the T4-5 level with extensive tumor in the left T5 pedicle extending into the spinal canal and displacing the thoracic spinal cord to the right. This is significantly progressive when compared to the prior study. I do not see any obvious cord tumor or abnormal enhancement. However, there is cord edema at the T4 level. This epidural tumor continues down to the bottom of T6.  There is tumor in the right side of the canal at T8 without significant mass effect on the spinal cord. This is progressive since the prior MRI.  There is also tumor in the right T9-10 neural foramen and extensive involvement of the posterior elements at T9 and T10. This is progressive.  Posterior tumor in the spinal canal at T12 and L1 is overall stable.  IMPRESSION: Progressive epidural tumor and osseous metastatic disease since the prior MRI. There is significant compression of  the thoracic cord at the T4 and T5 levels with edema like cord signal abnormality just above the tumor.   Electronically Signed   By: MKalman JewelsM.D.   On: 07/24/2013 14:21    ROS: As above Blood pressure 134/74, pulse 117, temperature 98.5 F (36.9 C), temperature source Oral, resp. rate 20, height 5' 2"  (1.575 m), weight 45.6 kg (100 lb 8.5 oz), last menstrual period 03/02/2011, SpO2 100.00%. Physical Exam  General: A cachectic pleasant 48year old paraplegic black female in no apparent distress  Neurologic exam: The patient is alert and oriented. Her strength is grossly normal in her upper extremities. The only motor function she has in her lower extremities is slight flickering of her left quadricep, otherwise she is paraplegic. The patient has a sensory level at approximately T4.  I [facility] the [facility] It demonstrates multiple spinal metastasis with significant spinal cord compression in the upper thoracic region.  Assessment/Plan: Widely metastatic breast cancer, thoracic metastasis, paraplegia: The patient is not an appropriate surgical candidate. I recommend comfort care measures only. Please call if I can be of further assistance.  Gelila Well D 07/24/2013, 7:25 PM

## 2013-07-24 NOTE — Progress Notes (Signed)
[facility] Work note LCSW met with patient to offer support, assess needs. Patient is much brighter today, yet still having some memory difficulties. It is unclear whether she understands fully the situation and shared plan to return to her home. She reports that DME has been ordered and she wants electric wheelchair. We discussed that hospice does not cover electric wheelchair or have staff to manage care needs. Patient daughter and young grandson live in the home, yet daughter works during the day. Hospice has finally met with extended family members to attempt to meet needs, resolve some past issues. Patient shared no plans to move to another relative home. LCSW is concerned with discharge plan home as patient may not have care in place to meet her needs if she indeed will no longer walk. Patient is grieving the losses in her life and the adjustments thus far. Support and active, reflective listening offered.  LCSW will continue to offer support as able. Katherina Right, [place]

## 2013-07-24 NOTE — Progress Notes (Signed)
COURTESY NOTE: appreciate learning of this patient's admission. Tonight [name] c/o pain. We discussed several options and I am starting her on a morphine drip at 2 mg/hr with ample PRN support. I am not cancelling the oral morphine PRN because there may be problems with access. I am continuing the duragesic for now but will likely transition the patient to an all-morphine regimen which can be continued at home or at [facility]. Anticipate transferring the patient to my service in AM. Gretly appreciate your help to [name]!

## 2013-07-24 NOTE — Progress Notes (Signed)
Inpatient Rm [place] [facility] RN Visit- M. Wynetta Emery, RN  Related admission to [facility] diagnosis of Breast cancer; is a DNR code status  Pt seen lying in bed had eaten some of breakfast try, alert & oriented, informed writer she was having intermittent back spasms; denied pain, stated they had just given her some pain medication. Pt reports she has feeling in her legs but can not move; she informed of plan for MRI later today. Pt voiced she would like to be able to go home and hopes to be able to make arrangements for any increased needs; though stated she would "not want a hospital bed because that would be giving up" - she was able to voice that it is unclear at this time just what her level of function will be and is waiting on further tests and discussion with doctors. Patient discussed with staff RN Alyse Low; chart reviewed, notes indicate discharge to her mother's home may be an option, however pt did not discuss this with writer on this visit. No family present at time of visit. Patient's home medication list and transfer summary is on shadow chart.  [facility] continues to follow; please call [facility] with any hospice needs.   Thank you.  Danton Sewer, RN  [facility]  Hospice Liaison  [phone])

## 2013-07-25 ENCOUNTER — Ambulatory Visit
Admit: 2013-07-25 | Discharge: 2013-07-25 | Disposition: A | Discharge status: Home / Self Care | Attending: Radiation Oncology | Admitting: Radiation Oncology

## 2013-07-25 DIAGNOSIS — R339 Retention of urine, unspecified: Secondary | ICD-10-CM

## 2013-07-25 DIAGNOSIS — R109 Unspecified abdominal pain: Secondary | ICD-10-CM

## 2013-07-25 DIAGNOSIS — C7931 Secondary malignant neoplasm of brain: Secondary | ICD-10-CM

## 2013-07-25 DIAGNOSIS — C7951 Secondary malignant neoplasm of bone: Secondary | ICD-10-CM

## 2013-07-25 DIAGNOSIS — C7952 Secondary malignant neoplasm of bone marrow: Secondary | ICD-10-CM

## 2013-07-25 DIAGNOSIS — C50519 Malignant neoplasm of lower-outer quadrant of unspecified female breast: Secondary | ICD-10-CM

## 2013-07-25 DIAGNOSIS — E43 Unspecified severe protein-calorie malnutrition: Secondary | ICD-10-CM

## 2013-07-25 DIAGNOSIS — C7949 Secondary malignant neoplasm of other parts of nervous system: Secondary | ICD-10-CM

## 2013-07-25 LAB — BASIC METABOLIC PANEL
BUN: 21 mg/dL (ref 6–23)
CHLORIDE: 97 meq/L (ref 96–112)
CO2: 24 mEq/L (ref 19–32)
Calcium: 9.8 mg/dL (ref 8.4–10.5)
Creatinine, Ser: 0.59 mg/dL (ref 0.50–1.10)
GFR calc non Af Amer: >90 mL/min (ref >90)
Glucose, Bld: 89 mg/dL (ref 70–99)
Potassium: 4.1 mEq/L (ref 3.7–5.3)
Sodium: 135 mEq/L — ABNORMAL LOW (ref 137–147)

## 2013-07-25 LAB — CBC
HCT: 25.4 % — ABNORMAL LOW (ref 36.0–46.0)
Hemoglobin: 8.3 g/dL — ABNORMAL LOW (ref 12.0–15.0)
MCH: 28.4 pg (ref 26.0–34.0)
MCHC: 32.7 g/dL (ref 30.0–36.0)
MCV: 87 fL (ref 78.0–100.0)
Platelets: 383 10*3/uL (ref 150–400)
RBC: 2.92 MIL/uL — ABNORMAL LOW (ref 3.87–5.11)
RDW: 18.4 % — AB (ref 11.5–15.5)
WBC: 6.6 10*3/uL (ref 4.0–10.5)

## 2013-07-25 MED ORDER — DOCUSATE SODIUM 100 MG PO CAPS
200.0000 mg | ORAL_CAPSULE | Freq: Two times a day (BID) | ORAL | Status: DC
  Administered 2013-07-25: 200 mg via ORAL
  Filled 2013-07-25 (×4): qty 2

## 2013-07-25 MED ORDER — FENTANYL 75 MCG/HR TD PT72
150.0000 ug | MEDICATED_PATCH | TRANSDERMAL | Status: DC
  Administered 2013-07-25: 150 ug via TRANSDERMAL
  Filled 2013-07-25: qty 2

## 2013-07-25 MED ORDER — MORPHINE SULFATE 30 MG PO TABS
30.0000 mg | ORAL_TABLET | ORAL | Status: DC | PRN
  Administered 2013-07-25 – 2013-07-26 (×6): 30 mg via ORAL
  Filled 2013-07-25 (×6): qty 1

## 2013-07-25 MED ORDER — BISACODYL 10 MG RE SUPP
10.0000 mg | Freq: Once | RECTAL | Status: AC
  Administered 2013-07-25: 10 mg via RECTAL
  Filled 2013-07-25: qty 1

## 2013-07-25 MED ORDER — LORAZEPAM 0.5 MG PO TABS
0.5000 mg | ORAL_TABLET | Freq: Four times a day (QID) | ORAL | Status: DC | PRN
  Administered 2013-07-25 – 2013-07-26 (×2): 0.5 mg via ORAL
  Filled 2013-07-25 (×2): qty 1

## 2013-07-25 MED ORDER — POLYETHYLENE GLYCOL 3350 17 G PO PACK
17.0000 g | PACK | Freq: Every day | ORAL | Status: DC
  Administered 2013-07-25: 17 g via ORAL
  Filled 2013-07-25 (×2): qty 1

## 2013-07-25 MED ORDER — MORPHINE SULFATE 30 MG PO TABS
30.0000 mg | ORAL_TABLET | ORAL | Status: DC | PRN
  Administered 2013-07-25 (×3): 30 mg via ORAL
  Filled 2013-07-25 (×2): qty 1

## 2013-07-25 NOTE — Progress Notes (Signed)
PT Cancellation Note  Patient Details Name: [name] MRN: [id number] DOB: 1966-04-15   Cancelled Treatment:    Reason Eval/Treat Not Completed: Patient declined, no reason specified, Attempted  X 2, pt  Reports she is not ready fro PT today. Will check abck in AM.   Claretha Cooper 07/25/2013, 4:02 PM Tresa Endo PT [phone]

## 2013-07-25 NOTE — Progress Notes (Signed)
[name]   DOB:10-Jul-1965   WP#:[id number]   VZS#:[id number]  Subjective: [name] tells me she woke up every 2 hours last night crying from pain. At present the pain is more like a band of pressure around her abdomen, R>L. No BM in 48 hours. Denies, h/a, N/V, dizzyness or visual changes. Refuses steroids because "they make me hallucinate." Was evaluated by [facility] yesterday-- not a surgical candidate.   Objective: middle aged [place] American woman examined in bed Filed Vitals:   07/25/13 0615  BP: 120/79  Pulse: 119  Temp: 98.2 F (36.8 C)  Resp: 18    Body mass index is 18.38 kg/(m^2).  Intake/Output Summary (Last 24 hours) at 07/25/13 0845 Last data filed at 07/25/13 0615  Gross per 24 hour  Intake    240 ml  Output   1600 ml  Net  -1360 ml     Sclerae unicteric  Oropharynx no thrush  No peripheral adenopathy  Lungs clear -- auscultated anterolaterally  Heart regular rate and rhythm  Abdomen distended, +BS, tender RUQ, unchanged  MSK no upper extremity lymphedema  Neuro B LE paresis; still has sensation  Breast exam: deferred  CBG (last 3)  No results found for this basename: GLUCAP,  in the last 72 hours   Labs:  Lab Results  Component Value Date   WBC 6.6 07/25/2013   HGB 8.3* 07/25/2013   HCT 25.4* 07/25/2013   MCV 87.0 07/25/2013   PLT 383 07/25/2013   NEUTROABS 5.6 07/24/2013    @LASTCHEMISTRY @  Urine Studies No results found for this basename: UACOL, UAPR, USPG, UPH, UTP, UGL, UKET, UBIL, UHGB, UNIT, UROB, ULEU, UEPI, UWBC, URBC, UBAC, CAST, CRYS, UCOM, BILUA,  in the last 72 hours  Basic Metabolic Panel:  Recent Labs Lab 07/22/13 0215 07/23/13 0637 07/24/13 0348 07/25/13 0340  NA 135* 135* 135* 135*  K 4.4 4.4 4.6 4.1  CL 95* 97 98 97  CO2 24 26 24 24   GLUCOSE 137* 140* 135* 89  BUN 14 16 18 21   CREATININE 0.67 0.62 0.62 0.59  CALCIUM 9.9 9.0 9.4 9.8  MG  --  1.9  --   --   PHOS  --  2.3  --   --    GFR Estimated Creatinine Clearance: 61.9  ml/min (by C-G formula based on Cr of 0.59). Liver Function Tests:  Recent Labs Lab 07/22/13 0215 07/23/13 0637  AST 84* 102*  ALT 28 37*  ALKPHOS 958* 941*  BILITOT 0.8 0.7  PROT 6.9 6.6  ALBUMIN 2.8* 2.6*   No results found for this basename: LIPASE, AMYLASE,  in the last 168 hours No results found for this basename: AMMONIA,  in the last 168 hours Coagulation profile  Recent Labs Lab 07/22/13 0700  INR 1.15    CBC:  Recent Labs Lab 07/22/13 0215 07/23/13 0637 07/24/13 0348 07/25/13 0340  WBC 5.8 6.0 6.5 6.6  NEUTROABS  --   --  5.6  --   HGB 8.5* 7.4* 8.0* 8.3*  HCT 26.4* 22.6* 24.9* 25.4*  MCV 86.8 86.6 88.0 87.0  PLT 314 295 341 383   Cardiac Enzymes:  Recent Labs Lab 07/22/13 0215  CKTOTAL 88   BNP: No components found with this basename: POCBNP,  CBG: No results found for this basename: GLUCAP,  in the last 168 hours D-Dimer No results found for this basename: DDIMER,  in the last 72 hours Hgb A1c No results found for this basename: HGBA1C,  in the  last 72 hours Lipid Profile No results found for this basename: CHOL, HDL, LDLCALC, TRIG, CHOLHDL, LDLDIRECT,  in the last 72 hours Thyroid function studies No results found for this basename: TSH, T4TOTAL, FREET3, T3FREE, THYROIDAB,  in the last 72 hours Anemia work up No results found for this basename: VITAMINB12, FOLATE, FERRITIN, TIBC, IRON, RETICCTPCT,  in the last 72 hours Microbiology No results found for this or any previous visit (from the past 240 hour(s)).    Studies:  Mr Thoracic Spine W Wo Contrast  07/24/2013   CLINICAL DATA:  Metastatic breast cancer.  EXAM: MRI THORACIC SPINE WITHOUT AND WITH CONTRAST  TECHNIQUE: Multiplanar and multiecho pulse sequences of the thoracic spine were obtained without and with intravenous contrast.  CONTRAST:  41m MULTIHANCE GADOBENATE DIMEGLUMINE 529 MG/ML IV SOLN  COMPARISON:  10/26/2012  FINDINGS: Extensive osseous metastatic disease is again  demonstrated throughout the thoracic and lumbar spines. There is also extensive rib disease and extensive intra-abdominal tumor/metastatic liver disease.  There is new epidural tumor on the left at T2-3 also involving the left nerve foramen.  There is extensive epidural tumor beginning at the T4-5 level with extensive tumor in the left T5 pedicle extending into the spinal canal and displacing the thoracic spinal cord to the right. This is significantly progressive when compared to the prior study. I do not see any obvious cord tumor or abnormal enhancement. However, there is cord edema at the T4 level. This epidural tumor continues down to the bottom of T6.  There is tumor in the right side of the canal at T8 without significant mass effect on the spinal cord. This is progressive since the prior MRI.  There is also tumor in the right T9-10 neural foramen and extensive involvement of the posterior elements at T9 and T10. This is progressive.  Posterior tumor in the spinal canal at T12 and L1 is overall stable.  IMPRESSION: Progressive epidural tumor and osseous metastatic disease since the prior MRI. There is significant compression of the thoracic cord at the T4 and T5 levels with edema like cord signal abnormality just above the tumor.   Electronically Signed   By: MKalman JewelsM.D.   On: 07/24/2013 14:21    Assessment: 48y.o. [name] woman  1. 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  2. Status post adjuvant chemotherapy July to November 2011, consisting of 4 cycles of dose dense Doxorubicin and Cyclophosphamide, followed by 4 of 12 planned weekly doses of Paclitaxel given adjuvantly, discontinued secondary to peripheral neuropathy.  3. Status post radiation to the right chest wall, truncated due to problems with the  patient missing appointments.  4. Tamoxifen started June of 2012, but discontinued due to nausea.  5. Metastatic disease to liver and bone pathologically documented October of 2012, estrogen receptor 100% positive, progesterone receptor 7% positive, with no HER-2 amplification; treatment in the metastatic setting has consisted of:  6. Faslodex started 03/16/2011, discontinued November 2013 with progression. Zoladex every 3 months also started on 03/16/2011, and monthly Zoledronic acid started November 2011, the Zometa I currently held due to pending dental procedures and possible extractions.  7. Status post thoracic laminectomy, T9-T11, for impending cord compression on 03/21/2011, followed by irradiation to the spine completed November 2011  8. 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 s/p palliative radiation to the Right hip clivus to 30 Gy completed 04/07/2012  10. Progression noted by scans in November 2013, at which time Faslodex was discontinued. Patient was started on Exemestane in early December, with Everolimus started in mid December, since discontinued. Also received Zoladex injections every 3 months, last given 09/05/2012. Exemestane discontinued February 2015 with progression 11. Nondisplaced right femoral neck fracture, status post right hip arthroplasty on 05/04/2012 under the care of [place].  12. Hypercalcemia with elevated serum creatinine: resolved  13. S/p palliative radiation therapy for cord compression completed 08/15/2012  14. Poorly controlled pain  15. Under hospice care, out of facility DO NOT RESUSCITATE in place  16. Severe malnutrition 17. 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 Constipation secondary to narcotics   Plan: I have explained to [facility] that she is now permanently  paralyzed. We will ask PT/OT to eval and see how much help patient will need with transfers. I have offered the patient [facility] but she has been there before and she prefers to NOT return there. We also discussed her staying at her mother's who has offered to take her in. Patient tells me she does not get along with her mother and will only go there if there is no other choice.   Patient tells me her daughter works 6-11 PM-- she is there most of the day and night. When she is not there, her boyfriend is. I do not think this arrangement is realistic but it is what the patient adamantly insists on.  Accordingly I am changing her medications to simplify her pain management. I am maximizing her bowel prophylaxis. I am asking SW and care management to help [place] with discharge plans. Once we have adequate support at home, if that can be arranged for, patient can be discharged.   Chauncey Cruel, MD 07/25/2013  8:45 AM

## 2013-07-25 NOTE — Progress Notes (Signed)
Ms. Limb is well known to me. Appreciate neurosurgery input. I have reviewed her MRI. She has progression at a site of previous radiation unfortunately. Given her progressive liver disease, previous radiation to this area, poor nutritional status and the length of time she has been plegic, I do not believe re-treatment of this area will be productive.  Her pain is well controlled at this time.  She has refused steroids.  I concur with continued comfort care and disposition with hospice.   SW [phone]

## 2013-07-25 NOTE — Care Management Note (Signed)
Cm spoke with patient's daughter,who presented to the bedside. Daughter aware RN and PT to provide some education concerning home care of mother prior to dc. Daughter aware [facility] will contact her concerning delivery of DME. No other needs identified at this time.    Venita Lick Yusra Ravert,MSN,RN [phone]

## 2013-07-25 NOTE — Progress Notes (Signed)
Inpatient Rm [place] [facility] RN Visit- M. Wynetta Emery, RN  Related admission to [facility] diagnosis of Breast cancer; is a DNR code status  Pt seen lying in bed, she informed writer of her discussion with Dr Jana Hakim this morning and was aware that she will not have the use of her legs; when asked what her family knew she stated they knew she was having trouble but she was not sure they understand the extent of her issues; on further discussion, Dorine talked about needing to assure that her daughter would be able to assume the lease on her apartment- she does not want to go to her mother's home from the hospital but would consider going to her mother's once she got things settled at her home; Denim did talk with her mother on the phone while writer was in the room; Brittannie became tearful, stating she feels rushed and wants to try to arrange things at her house and continue to work with her family -her daughter and her sisters and mother to help with her care at her home; - this Probation officer returned to room later in the day joined by [employer] and [employer]; discussed at length plans for discharge -Skylah is aware of her increased physical care needs -that she will go home with a Foley catheter and need a bowel regimen and this education will need to be reviewed with her family caregivers by the [facility] team; Garland stated she feels her daughter, sisters and mother will all be able to be involved helping with her needs at home; she agreed with having a hospice aide to help with her personal care and for the need for PTAR transport home -Loma is hopeful to have her daughter visit today after work and she wants to talk with her about these arrangements- dtr, Marijean Niemann [phone])   plans for equipment delivery to her home (Package D: electric hospital bed, AP&P mattress, overbed table and add a [employer] fast streamer); to be arranged with Loanne Drilling   * Note: Loanne Drilling is only  available after 2 pm because she works    Will continue to follow to assist with hospice needs   Patient's home medication list and transfer summary is on shadow chart.  Thank You Danton Sewer, RN [phone])

## 2013-07-25 NOTE — Care Management Note (Signed)
Cm spoke with patient at the bedside with [place] and [place] concerning discharge planning. Pt states dc disposition is definitely home with daughter. Cm explained to pt DME requires delivery to her home prior to discharge. Cm informed pt of MD wanting family to receive education concerning pt transfers, dme, higher intensity of care prior to discharge. Pt telephoned adult daughter and sister to arrange bedside teaching with family with Physical Therapy. Pt's family were unavailable to arrange teaching at this time. Will continue to follow.    Venita Lick Dennice Tindol,MSN,RN [phone]

## 2013-07-25 NOTE — Progress Notes (Signed)
[facility] work note LCSW, RN liaison RN, Sheron Nightingale and patient talked about discharge plan home. Patient shared desire to return home and no desire to go to her mother's home for care at times of discharge. Patient voiced several personal reasons in her desire to return home- primarily to ensure that her daughter has a home. Patient is not against transitioning to her mother's home at some point, yet not now. Patient is agreeable to having DME- hospital bed, over the bed table and hoyer lift be sent to the home. She voiced need to talk to her daughter, Loanne Drilling and confirm the plans as patient told her at an earlier time that she did not want DME. Patient called daughter and sister, Cameo during visit to attempt to request visit to hospital for teaching and instruction regarding new care needs.  Patient reports that she has a good support network and that extended family will offer care and be able to come to her home to provide this care.  Patient is aware of her debility, yet reports that she has put it in God's hands and if she walks again it will be God's will. LCSW offered support and education about f/u from [place].  Katherina Right, [place]

## 2013-07-25 NOTE — Care Management Note (Signed)
Cm spoke with patient at the bedside concerning discharge planning. Cm informed by MD that pt has declined residential hospice or option to reside with mother for assistance with home care. Per MD pt informed if returns to hospital disposition is to residential hospice or mother's residence. Pt informed of MD recommendation of hoyer lift and hospital bed. Pt agreeable to have DME delivered although she states she may not utilize equipment. Cm attempted to contact pt's daughter to arrange delivery. Cm left contact information on VM.    Venita Lick Marlane Hirschmann,MSN,RN [phone]

## 2013-07-25 NOTE — Progress Notes (Signed)
[facility]  [facility] is providing the following services: Bed and wheelchair  If patient discharges after hours, please call [phone].   Linward Headland 07/25/2013, 12:52 PM

## 2013-07-25 NOTE — Progress Notes (Signed)
55 ml of 1mg /ml Morphine gtt wasted in sink. Sandie Ano, RN witnessed.

## 2013-07-26 DIAGNOSIS — R110 Nausea: Secondary | ICD-10-CM

## 2013-07-26 DIAGNOSIS — G959 Disease of spinal cord, unspecified: Secondary | ICD-10-CM

## 2013-07-26 MED ORDER — FENTANYL 50 MCG/HR TD PT72: MEDICATED_PATCH | TRANSDERMAL | Status: DC

## 2013-07-26 MED ORDER — ENSURE COMPLETE PO LIQD: 237.0000 mL | Freq: Three times a day (TID) | ORAL | Status: AC

## 2013-07-26 MED ORDER — MORPHINE SULFATE 30 MG PO TABS: 30.0000 mg | ORAL_TABLET | ORAL | Status: DC | PRN

## 2013-07-26 MED ORDER — FENTANYL 100 MCG/HR TD PT72: 100.0000 ug | MEDICATED_PATCH | TRANSDERMAL | Status: DC

## 2013-07-26 NOTE — Progress Notes (Signed)
[name]   DOB:02-10-1966   WV#:[id number]   WNI#:[id number]  Subjective: [name] had multiple BMs yesterday and one this AM-- we need to "back off a little" she tells me on the bowel prophylaxis. She has had some nausea, no vomiting. Pain is moderately controlled. Her legs are now more numb and she was tearful abut shi development. Tolerating foley. Did not participate in PT  Objective: middle aged African American woman examined in bed Filed Vitals:   07/26/13 0408  BP: 107/67  Pulse: 106  Temp: 98.5 F (36.9 C)  Resp: 15    Body mass index is 18.38 kg/(m^2).  Intake/Output Summary (Last 24 hours) at 07/26/13 1022 Last data filed at 07/26/13 3818  Gross per 24 hour  Intake    240 ml  Output   1100 ml  Net   -860 ml     Sclerae unicteric, mod L ptosis as before  Oropharynx no thrush  No peripheral adenopathy  Lungs clear -- auscultated anterolaterally  Heart regular rate and rhythm  Abdomen distended, +BS, tender RUQ, unchanged  MSK no upper extremity lymphedema  Neuro B LE paresis; legs a bit more numb  Breast exam: deferred  CBG (last 3)  No results found for this basename: GLUCAP,  in the last 72 hours   Labs:  Lab Results  Component Value Date   WBC 6.6 07/25/2013   HGB 8.3* 07/25/2013   HCT 25.4* 07/25/2013   MCV 87.0 07/25/2013   PLT 383 07/25/2013   NEUTROABS 5.6 07/24/2013    @LASTCHEMISTRY @  Urine Studies No results found for this basename: UACOL, UAPR, USPG, UPH, UTP, UGL, UKET, UBIL, UHGB, UNIT, UROB, ULEU, UEPI, UWBC, URBC, UBAC, CAST, CRYS, UCOM, BILUA,  in the last 72 hours  Basic Metabolic Panel:  Recent Labs Lab 07/22/13 0215 07/23/13 0637 07/24/13 0348 07/25/13 0340  NA 135* 135* 135* 135*  K 4.4 4.4 4.6 4.1  CL 95* 97 98 97  CO2 24 26 24 24   GLUCOSE 137* 140* 135* 89  BUN 14 16 18 21   CREATININE 0.67 0.62 0.62 0.59  CALCIUM 9.9 9.0 9.4 9.8  MG  --  1.9  --   --   PHOS  --  2.3  --   --    GFR Estimated Creatinine Clearance: 61.9  ml/min (by C-G formula based on Cr of 0.59). Liver Function Tests:  Recent Labs Lab 07/22/13 0215 07/23/13 0637  AST 84* 102*  ALT 28 37*  ALKPHOS 958* 941*  BILITOT 0.8 0.7  PROT 6.9 6.6  ALBUMIN 2.8* 2.6*   No results found for this basename: LIPASE, AMYLASE,  in the last 168 hours No results found for this basename: AMMONIA,  in the last 168 hours Coagulation profile  Recent Labs Lab 07/22/13 0700  INR 1.15    CBC:  Recent Labs Lab 07/22/13 0215 07/23/13 0637 07/24/13 0348 07/25/13 0340  WBC 5.8 6.0 6.5 6.6  NEUTROABS  --   --  5.6  --   HGB 8.5* 7.4* 8.0* 8.3*  HCT 26.4* 22.6* 24.9* 25.4*  MCV 86.8 86.6 88.0 87.0  PLT 314 295 341 383   Cardiac Enzymes:  Recent Labs Lab 07/22/13 0215  CKTOTAL 88   BNP: No components found with this basename: POCBNP,  CBG: No results found for this basename: GLUCAP,  in the last 168 hours D-Dimer No results found for this basename: DDIMER,  in the last 72 hours Hgb A1c No results found for this basename:  HGBA1C,  in the last 72 hours Lipid Profile No results found for this basename: CHOL, HDL, LDLCALC, TRIG, CHOLHDL, LDLDIRECT,  in the last 72 hours Thyroid function studies No results found for this basename: TSH, T4TOTAL, FREET3, T3FREE, THYROIDAB,  in the last 72 hours Anemia work up No results found for this basename: VITAMINB12, FOLATE, FERRITIN, TIBC, IRON, RETICCTPCT,  in the last 72 hours Microbiology No results found for this or any previous visit (from the past 240 hour(s)).    Studies:  Mr Thoracic Spine W Wo Contrast  07/24/2013   CLINICAL DATA:  Metastatic breast cancer.  EXAM: MRI THORACIC SPINE WITHOUT AND WITH CONTRAST  TECHNIQUE: Multiplanar and multiecho pulse sequences of the thoracic spine were obtained without and with intravenous contrast.  CONTRAST:  41m MULTIHANCE GADOBENATE DIMEGLUMINE 529 MG/ML IV SOLN  COMPARISON:  10/26/2012  FINDINGS: Extensive osseous metastatic disease is again  demonstrated throughout the thoracic and lumbar spines. There is also extensive rib disease and extensive intra-abdominal tumor/metastatic liver disease.  There is new epidural tumor on the left at T2-3 also involving the left nerve foramen.  There is extensive epidural tumor beginning at the T4-5 level with extensive tumor in the left T5 pedicle extending into the spinal canal and displacing the thoracic spinal cord to the right. This is significantly progressive when compared to the prior study. I do not see any obvious cord tumor or abnormal enhancement. However, there is cord edema at the T4 level. This epidural tumor continues down to the bottom of T6.  There is tumor in the right side of the canal at T8 without significant mass effect on the spinal cord. This is progressive since the prior MRI.  There is also tumor in the right T9-10 neural foramen and extensive involvement of the posterior elements at T9 and T10. This is progressive.  Posterior tumor in the spinal canal at T12 and L1 is overall stable.  IMPRESSION: Progressive epidural tumor and osseous metastatic disease since the prior MRI. There is significant compression of the thoracic cord at the T4 and T5 levels with edema like cord signal abnormality just above the tumor.   Electronically Signed   By: MKalman JewelsM.D.   On: 07/24/2013 14:21    Assessment: 48y.o. [place] woman  1. 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  2. Status post adjuvant chemotherapy July to November 2011, consisting of 4 cycles of dose dense Doxorubicin and Cyclophosphamide, followed by 4 of 12 planned weekly doses of Paclitaxel given adjuvantly, discontinued secondary to peripheral neuropathy.  3. Status post radiation to the right chest wall, truncated due to problems with the  patient missing appointments.  4. Tamoxifen started June of 2012, but discontinued due to nausea.  5. Metastatic disease to liver and bone pathologically documented October of 2012, estrogen receptor 100% positive, progesterone receptor 7% positive, with no HER-2 amplification; treatment in the metastatic setting has consisted of:  6. Faslodex started 03/16/2011, discontinued November 2013 with progression. Zoladex every 3 months also started on 03/16/2011, and monthly Zoledronic acid started November 2011, the Zometa I currently held due to pending dental procedures and possible extractions.  7. Status post thoracic laminectomy, T9-T11, for impending cord compression on 03/21/2011, followed by irradiation to the spine completed November 2011  8. 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 s/p palliative radiation to the Right hip clivus to 30 Gy completed 04/07/2012  10. Progression noted by scans in November 2013, at which time Faslodex was discontinued. Patient was started on Exemestane in early December, with Everolimus started in mid December, since discontinued. Also received Zoladex injections every 3 months, last given 09/05/2012. Exemestane discontinued February 2015 with progression 11. Nondisplaced right femoral neck fracture, status post right hip arthroplasty on 05/04/2012 under the care of [place].  12. Hypercalcemia with elevated serum creatinine: resolved  13. S/p palliative radiation therapy for cord compression completed 08/15/2012  14. Poorly controlled pain  15. Under hospice care, out of facility DO NOT RESUSCITATE in place  16. Severe malnutrition 17. 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 Constipation secondary to narcotics   Plan: we continue to work at trying to  get all the arrangements made to get [facility] home. [facility] d/c date 2/27. I will simplify her bowel prophylaxis today. Encouraged her to partcipate in PT.   Chauncey Cruel, MD 07/26/2013  10:22 AM

## 2013-07-26 NOTE — Progress Notes (Signed)
Inpatient Rm [place] [facility] RN Visit- M. Wynetta Emery, RN  Related admission to [facility] diagnosis of Breast cancer; is a DNR code status  Pt seen lying in bed, alert, appears in good spirits- introduced her sister,Cameo, who was at bedside; Aaima stated she was not in any pain at this time; however reports increased numbness from the waist down "I really can't feel anything.abdominal tenderness least yesterday I felt a little.. I don't even know when I am moving my bowels and that is very upsetting" emotional support offered; began discussion on the goal of the bowel regimen - and that the [facility] team would also be working with her and her family at home on this as well; sister Cameo affirmed the willingness of family to try to help [place] once she is at home [place] shared discussing with Dr Jana Hakim plan for discharge possibly Friday 2/27; she stated the equipment was supposed to be delivered to her home today, her daughter is there to receive it and asks about the [facility] team teaching family use of Harrel Lemon lift once she is at home Chart reviewed and spoke with staff RN Maudie Mercury- Fentanyl patch increased to 150 mcg; MSIR 30 mg q1 hr PRN BTP  Will continue to follow to assist with hospice needs. Please call [phone] with any hospice concerns Danton Sewer, RN [phone])

## 2013-07-26 NOTE — Evaluation (Signed)
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  Clinical Impression  Pt requires 2 person assist for sliding board transfers. Pt has no sitting balance at this time without external support. Pt will benefit from use of Harrel Lemon for family to transfer pt at this time. Also a safety belt will be needed  Around trunk if pt sits in WC. Will discuss with daughter re:  Therapy recommendations for safety.    PT Assessment  Patient needs continued PT services    Follow Up Recommendations  Home health PT    Does the patient have the potential to tolerate intense rehabilitation      Barriers to Discharge        Equipment Recommendations   (pt has Hoyer lift, WC, BSC. )    Recommendations for Other Services     Frequency Min 3X/week    Precautions / Restrictions Precautions Precautions: Fall Precaution Comments: poor balance sitting.   Pertinent Vitals/Pain  C/O R chest wall at breast level, RN notified.      Mobility  Bed Mobility Overal bed mobility: Needs Assistance;+2 for physical assistance;+ 2 for safety/equipment Bed Mobility: Rolling;Sidelying to Sit;Sit to Sidelying Rolling: Total assist Sidelying to sit: Total assist;+2 for physical assistance;+2 for safety/equipment Sit to sidelying: +2 for physical assistance;+2 for safety/equipment;Total assist General bed mobility comments: after  legs  placed in hooklyiong, slow turns to  side, use of rail. pt reports increased discomfort  in L breast area/chest wall. Transfers Overall transfer level: Needs assistance Transfers: Lateral/Scoot Transfers  Lateral/Scoot Transfers: With slide board General transfer comment: sliding board placed by therapist,  pt has no control of balance to weight shift. Requires 2 person assist for  safety in sitting and transfers.    Exercises     PT Diagnosis: Other (comment);Acute pain (paraplegia)  PT Problem List: Decreased strength;Decreased range of motion;Decreased balance;Decreased mobility;Impaired sensation;Decreased safety awareness;Decreased knowledge of precautions;Decreased knowledge of use of DME;Pain PT Treatment Interventions: DME instruction;Functional mobility training;Therapeutic activities;Therapeutic exercise;Patient/family education;Wheelchair mobility training;Balance training     PT Goals(Current goals can be found in the care plan section) Acute Rehab PT Goals Patient Stated Goal: i want to go home PT Goal Formulation: With patient Time For Goal Achievement: 08/09/13 Potential to Achieve Goals: Fair  Visit Information  Last PT Received On: 07/26/13 Assistance Needed: +2 PT/OT/SLP Co-Evaluation/Treatment: Yes Reason for Co-Treatment: Complexity of the patient's impairments (multi-system involvement);For patient/therapist safety PT goals addressed during session: Mobility/safety with mobility;Balance;Proper use of DME History of Present Illness:  Pt  admitted 07/22/13 with Leg wekness, has progressed to paraplegia due to spinal mets.       Prior [place] expects to be discharged to:: Private residence Living Arrangements: Other relatives Available Help at Discharge: Family Type of Home: House Home Access: Level entry Home Layout: One level Additional Comments: Hospice is providing hospital bed, Hoyer lift, WC, has BSc. Prior Function Level of Independence: Independent with assistive device(s) Communication Communication: No difficulties    Cognition       Extremity/Trunk Assessment Upper Extremity Assessment Upper Extremity Assessment: Defer to OT evaluation Lower Extremity Assessment Lower Extremity Assessment: RLE deficits/detail;LLE  deficits/detail RLE Deficits / Details: absent  motor, no sensory LLE Deficits / Details:  pt does appreciate deep pressure on leg, no motor  Cervical / Trunk Assessment Cervical / Trunk Assessment: Other exceptions Cervical / Trunk Exceptions: pt lacks trunk control in sitting, diminished sensory to breast area.   Balance Balance Overall balance assessment: Needs assistance Sitting-balance support: Feet supported;Bilateral upper extremity supported Sitting balance-Leahy Scale: Zero Sitting balance - Comments: pt  props with bil. UE's but is unable to maintain balance for > few seconds, especially when pt moves head.  End of Session PT - End of Session Activity Tolerance: Patient limited by pain;Patient limited by fatigue Patient left: in bed;with call bell/phone within reach Nurse Communication: Mobility status;Need for lift equipment  GP     Claretha Cooper 07/26/2013, 2:41 PM Tresa Endo PT [phone]

## 2013-07-26 NOTE — Evaluation (Signed)
Occupational Therapy Evaluation Patient Details Name: [name] MRN: [id number] DOB: 16-Jul-1965 Today's Date: 07/26/2013 Time: 3151-7616 OT Time Calculation (min): 37 min  OT Assessment / Plan / Recommendation History of present illness  Pt  admitted 07/22/13 with Leg weakness, has progressed to paraplegia due to spinal mets.  She has a h/o breast CA   Clinical Impression   Pt was admitted for the above condition.  Will follow in acute to educate family in assisting patient with adls while minimizing pain.      OT Assessment  Patient needs continued OT Services    Follow Up Recommendations  No OT follow up (HHPT can continue education)    Barriers to Discharge      Equipment Recommendations   (possibly drop arm commode but pt needs A x 2 with sliding board and constant assistance for balance.  Pt may be safer using bedpan)    Recommendations for Other Services    Frequency  Min 2X/week    Precautions / Restrictions Precautions Precautions: Fall Precaution Comments: poor balance sitting. Restrictions Weight Bearing Restrictions: No   Pertinent Vitals/Pain C/o back pain.  Repositioned and requested pain medication    ADL  Grooming: Minimal assistance (for hair; fatiques easily) Where Assessed - Grooming: Supine, head of bed up Upper Body Bathing: Minimal assistance Where Assessed - Upper Body Bathing: Supine, head of bed up Upper Body Dressing: Minimal assistance Where Assessed - Upper Body Dressing: Supine, head of bed up Toilet Transfer: Simulated;+2 Total assistance Toilet Transfer: Patient Percentage: 0% Toilet Transfer Method: Transfer board Transfers/Ambulation Related to ADLs: performed sliding board transfer to w/c and back to bed.  Needs assist for unsupported sitting ADL Comments: pt needs total A x 2 for LB adls and rolling for toilet hygiene.  Pt requested reacher which was provided. This may provide increased control over environment   OT Diagnosis:  Generalized weakness  OT Problem List: Decreased strength;Decreased activity tolerance;Impaired balance (sitting and/or standing);Impaired UE functional use;Pain OT Treatment Interventions: Patient/family education   OT Goals(Current goals can be found in the care plan section) Acute Rehab OT Goals Patient Stated Goal: i want to go home OT Goal Formulation: With patient Time For Goal Achievement: 08/09/13 Potential to Achieve Goals: Good ADL Goals Additional ADL Goal #1: family will demonstrate ability to assist pt rolling for adls, maintaining neutral spine for comfort Additional ADL Goal #2: family will demonstrate vs. verbalize use of sliding board with A x 2 for transfer to drop arm commode  Visit Information  Last OT Received On: 07/26/13 Assistance Needed: +2 PT/OT/SLP Co-Evaluation/Treatment: Yes Reason for Co-Treatment: For patient/therapist safety PT goals addressed during session: Mobility/safety with mobility;Balance;Proper use of DME OT goals addressed during session: Strengthening/ROM;ADL's and self-care History of Present Illness:  Pt  admitted 07/22/13 with Leg wekness, has progressed to paraplegia due to spinal mets.       Prior [place] expects to be discharged to:: Private residence Living Arrangements: Other relatives Available Help at Discharge: Family Type of Home: House Home Access: Level entry Home Layout: One level [place]: Bedside commode Additional Comments: Hospice is providing hospital bed, [employer], WC, has Pharmacist, community. Prior Function Level of Independence: Independent with assistive device(s) Communication Communication: No difficulties         Vision/Perception     Cognition  Cognition Arousal/Alertness: Awake/alert Behavior During Therapy: WFL for tasks assessed/performed Overall Cognitive Status: Within Functional Limits for tasks assessed    Extremity/Trunk  Assessment Upper Extremity  Assessment Upper Extremity Assessment: Generalized weakness (grossly 3/5) Lower Extremity Assessment per PT: Lower Extremity Assessment: RLE deficits/detail;LLE deficits/detail RLE Deficits / Details: absent  motor, no sensory LLE Deficits / Details: pt does appreciate deep pressure on leg, no motor  Cervical / Trunk Assessment Cervical / Trunk Assessment: Other exceptions Cervical / Trunk Exceptions: pt lacks trunk control in sitting, diminished sensory to breast area.     Mobility Bed Mobility Overal bed mobility: Needs Assistance;+2 for physical assistance;+ 2 for safety/equipment Bed Mobility: Rolling;Sidelying to Sit;Sit to Sidelying Rolling: Total assist Sidelying to sit: Total assist;+2 for physical assistance;+2 for safety/equipment Sit to sidelying: +2 for physical assistance;+2 for safety/equipment;Total assist General bed mobility comments: after  legs  placed in hooklyiong, slow turns to  side, use of rail. pt reports increased discomfort  in L breast area/chest wall. Transfers Overall transfer level: Needs assistance Transfers: Lateral/Scoot Transfers  Lateral/Scoot Transfers: With slide board General transfer comment: sliding board placed by therapist, pt has no control of balance to weight shift. Requires 2 person assist for  safety in sitting and transfers.     Exercise     Balance Balance Overall balance assessment: Needs assistance Sitting-balance support: Feet supported Sitting balance-Leahy Scale: Zero Sitting balance - Comments: pt  props with bil. UE's but is unable to maintain balance for > few seconds, especially when pt moves head.   End of Session OT - End of Session Activity Tolerance: Patient limited by fatigue Patient left: in bed;with call bell/phone within reach  [place] 07/26/2013, 4:24 PM Lesle Chris, OTR/L [phone] 07/26/2013

## 2013-07-26 NOTE — Care Management Note (Signed)
Per MD pt can dc home today as pt request. Cm informed bedside RN to contact [facility] upon dc to arrange transportation.Family can utilize [facility] if discharged prior to 6pm, if after 6pm family will require utilization of another outpatient pharmacy.Rn to instruct patient to contact [facility] upon arrival home.     Venita Lick Zita Ozimek,MSN,RN [phone]

## 2013-07-26 NOTE — Discharge Summary (Signed)
Physician Discharge Summary  Patient ID: [name] MRN: [id number] DOB/AGE: 12-05-1965 48 y.o.  Admit date: 07/22/2013 Discharge date: 07/26/2013  Primary Care Physician:  Pcp Not In System   Discharge Diagnoses:  Paraplegia, spinal cord compression, poorly controlled pain, nausea, urinary retention, metastatic breast cancer, severe malnutrition  Present on Admission:  . Protein-calorie malnutrition, severe . 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 Urinary retention . Paraplegia  Discharge Medications:    Medication List    STOP taking these medications       cyclobenzaprine 10 MG tablet  Commonly known as:  FLEXERIL     dexamethasone 4 MG tablet  Commonly known as:  DECADRON     exemestane 25 MG tablet  Commonly known as:  AROMASIN     fentaNYL 100 MCG/HR  Commonly known as:  DURAGESIC - dosed mcg/hr  Replaced by:  fentaNYL 50 MCG/HR     fluconazole 100 MG tablet  Commonly known as:  DIFLUCAN     triamterene-hydrochlorothiazide 37.5-25 MG per capsule  Commonly known as:  DYAZIDE      TAKE these medications       diphenhydrAMINE 25 mg capsule  Commonly known as:  BENADRYL  Take 1 capsule (25 mg total) by mouth every 6 (six) hours as needed for itching.     feeding supplement (ENSURE COMPLETE) Liqd  Take 237 mLs by mouth 3 (three) times daily with meals.     fentaNYL 100 MCG/HR  Commonly known as:  DURAGESIC - dosed mcg/hr  Place 1 patch (100 mcg total) onto the skin every 3 (three) days.     fentaNYL 50 MCG/HR  Commonly known as:  DURAGESIC - dosed mcg/hr  Add to 100 mcg/h patch to total 150 mch/h TD q 72h     guaiFENesin 100 MG/5ML Soln  Commonly known as:  ROBITUSSIN  Take 5 mLs (100 mg total) by mouth every 4 (four) hours as needed for cough or to loosen phlegm.     lactulose 10 G packet  Commonly known as:  CEPHULAC  Take 1 packet (10 g total) by mouth 3 (three) times daily.     LORazepam 1 MG tablet  Commonly  known as:  ATIVAN  Take 1 tablet (1 mg total) by mouth every 6 (six) hours as needed for anxiety.     morphine 30 MG tablet  Commonly known as:  MSIR  Take 1 tablet (30 mg total) by mouth every hour as needed for moderate pain or severe pain.     omeprazole 40 MG capsule  Commonly known as:  PRILOSEC  Take 1 capsule (40 mg total) by mouth daily.     ondansetron 4 MG disintegrating tablet  Commonly known as:  ZOFRAN-ODT  Take 4 mg by mouth every 4 (four) hours as needed for nausea or vomiting.     polyethylene glycol packet  Commonly known as:  MIRALAX / GLYCOLAX  Take 17 g by mouth daily.     promethazine 25 MG tablet  Commonly known as:  PHENERGAN  Take 1 tablet (25 mg total) by mouth every 6 (six) hours as needed for nausea.     senna-docusate 8.6-50 MG per tablet  Commonly known as:  Senokot-S  Take 2-4 tablets by mouth 2 (two) times daily.         Disposition and Follow-up: discharged to home at patient's request (she was offered [employer] and also her mother offered to take her in her own  home)  Significant Diagnostic Studies:  Dg Chest 2 View  06/29/2013   CLINICAL DATA:  Cough, weakness, history of breast cancer  EXAM: CHEST  2 VIEW  COMPARISON:  06/25/2013  FINDINGS: Prior right mastectomy and axillary lymph node dissection. Normal heart size and vascularity. Minor streaky basilar atelectasis. No edema, pneumonia, effusion pneumothorax. Trachea midline.  IMPRESSION: Postop changes.  Basilar atelectasis.   Electronically Signed   By: Daryll Brod M.D.   On: 06/29/2013 20:54   Dg Lumbar Spine Complete  07/22/2013   CLINICAL DATA:  Back pain.  Metastatic breast cancer.  EXAM: LUMBAR SPINE - COMPLETE 4+ VIEW  COMPARISON:  DG ABD ACUTE W/CHEST dated 06/25/2013; MR ABDOMEN WO/W CM dated 10/28/2012  FINDINGS: Innumerable calcific densities noted over the liver consistent with known metastatic disease. Lytic lesion is noted of the right eleventh rib consistent with metastatic  disease. Sclerosis noted in the right tenth rib and mild multifocal sclerosis noted over the lumbar spine, sacrum, and iliac crests suggesting metastatic disease. No acute lumbar spine fracture. Calcification within the pelvis most consistent with a fibroid.  IMPRESSION: 1. Multiple calcific densities projected over the liver consistent with known hepatic metastatic disease. 2. Lytic lesion right eleventh rib consistent wake metastatic disease. Sclerotic lesion right tenth rib consistent with metastatic disease. Subtle sclerosis noted lumbar spine and in the pelvis suggesting metastatic disease to these regions. No evidence of lumbar spine fracture.   Electronically Signed   By: Marcello Moores  Register   On: 07/22/2013 02:40   Mr Jeri Cos Wo Contrast  07/22/2013   CLINICAL DATA:  Metastatic breast cancer. Bilateral leg weakness. Rule out metastatic disease.  EXAM: MRI HEAD WITHOUT AND WITH CONTRAST  TECHNIQUE: Multiplanar, multiecho pulse sequences of the brain and surrounding structures were obtained without and with intravenous contrast.  CONTRAST:  71m MULTIHANCE GADOBENATE DIMEGLUMINE 529 MG/ML IV SOLN  COMPARISON:  MRI 03/21/2013  FINDINGS: Expansile mass in the clivus has progressed and is compatible with metastatic disease. New area of metastatic disease in the C2 vertebral body on the right. No epidural tumor in the spinal canal. C4 and C5 not imaged on today's study. Calvarium remains heterogeneous which could represent bony metastatic disease.  Negative for acute infarct. Hyperintensity in the pons has progressed and may be due to chemotherapy or radiation. Small white matter hyperintensities in the frontal lobes are present which are unchanged.  Negative for mass or edema. Postcontrast imaging reveals normal enhancement. The patient did not hold still for the postcontrast images which could obscure small enhancing lesions.  IMPRESSION: Metastatic disease to the clivus has progressed. New area of metastatic  disease in the C2 vertebral body.  Negative for brain metastasis.   Electronically Signed   By: CFranchot GalloM.D.   On: 07/22/2013 10:49   Mr Thoracic Spine W Wo Contrast  07/24/2013   CLINICAL DATA:  Metastatic breast cancer.  EXAM: MRI THORACIC SPINE WITHOUT AND WITH CONTRAST  TECHNIQUE: Multiplanar and multiecho pulse sequences of the thoracic spine were obtained without and with intravenous contrast.  CONTRAST:  150mMULTIHANCE GADOBENATE DIMEGLUMINE 529 MG/ML IV SOLN  COMPARISON:  10/26/2012  FINDINGS: Extensive osseous metastatic disease is again demonstrated throughout the thoracic and lumbar spines. There is also extensive rib disease and extensive intra-abdominal tumor/metastatic liver disease.  There is new epidural tumor on the left at T2-3 also involving the left nerve foramen.  There is extensive epidural tumor beginning at the T4-5 level with extensive tumor in the left  T5 pedicle extending into the spinal canal and displacing the thoracic spinal cord to the right. This is significantly progressive when compared to the prior study. I do not see any obvious cord tumor or abnormal enhancement. However, there is cord edema at the T4 level. This epidural tumor continues down to the bottom of T6.  There is tumor in the right side of the canal at T8 without significant mass effect on the spinal cord. This is progressive since the prior MRI.  There is also tumor in the right T9-10 neural foramen and extensive involvement of the posterior elements at T9 and T10. This is progressive.  Posterior tumor in the spinal canal at T12 and L1 is overall stable.  IMPRESSION: Progressive epidural tumor and osseous metastatic disease since the prior MRI. There is significant compression of the thoracic cord at the T4 and T5 levels with edema like cord signal abnormality just above the tumor.   Electronically Signed   By: Kalman Jewels M.D.   On: 07/24/2013 14:21   Mr Lumbar Spine W Wo Contrast  07/22/2013    CLINICAL DATA:  New onset of bilateral leg weakness and numbness. Known metastatic breast cancer. Paraplegia.  EXAM: MRI LUMBAR SPINE WITHOUT AND WITH CONTRAST  TECHNIQUE: Multiplanar and multiecho pulse sequences of the lumbar spine were obtained without and with intravenous contrast.  CONTRAST:  8 cc MultiHance  COMPARISON:  Radiographs dated 07/22/2013 and MRI dated 10/26/2012  FINDINGS: Tip of the conus is at L1. The patient has extensive metastatic disease in the liver, markedly progressed since the prior study with inferior displacement of the right kidney.  There is increased tumor in the posterior elements of T12 which compresses the posterior aspect of the thecal sac but does not compress the spinal cord. Tumor extends into the right neural foramen at L1-2 but this is unchanged. The tumor surrounding the thecal sac at L1 seen on the prior study has diminished.  There are numerous metastatic lesions throughout the lumbar spine but there is no other extension of tumor into the spinal canal. There are no pathologic fractures. There are numerous metastatic lesions in the pelvic bones and sacrum. Those lesions have progressed.  IMPRESSION: 1. Decreased tumor surrounding the thecal sac at L1 since the prior study. 2. Increased tumor in the posterior elements of T12 slightly compressing the posterior aspect of the thecal sac but without focal neural impingement. 3. Marked progression of metastatic disease in the liver with increased metastases in the spine and sacrum and pelvic bones. 4. No neural impingement to explain the patient's urinary retention and right leg weakness.   Electronically Signed   By: Rozetta Nunnery M.D.   On: 07/22/2013 10:46    Discharge Laboratory Values: Basic Metabolic Panel:  Recent Labs Lab 07/22/13 0215 07/23/13 0637 07/24/13 0348 07/25/13 0340  NA 135* 135* 135* 135*  K 4.4 4.4 4.6 4.1  CL 95* 97 98 97  CO2 _0 GLUCOSE 137* 140* 135* 89  BUN _1 CREATININE 0.67 0.62 0.62 0.59  CALCIUM 9.9 9.0 9.4 9.8  MG  --  1.9  --   --   PHOS  --  2.3  --   --    GFR Estimated Creatinine Clearance: 61.9 ml/min (by C-G formula based on Cr of 0.59). Liver Function Tests:  Recent Labs Lab 07/22/13 0215 07/23/13 0637  AST 84* 102*  ALT 28 37*  ALKPHOS 958* 941*  BILITOT 0.8  0.7  PROT 6.9 6.6  ALBUMIN 2.8* 2.6*   No results found for this basename: LIPASE, AMYLASE,  in the last 168 hours No results found for this basename: AMMONIA,  in the last 168 hours Coagulation profile  Recent Labs Lab 07/22/13 0700  INR 1.15    CBC:  Recent Labs Lab 07/22/13 0215 07/23/13 0637 07/24/13 0348 07/25/13 0340  WBC 5.8 6.0 6.5 6.6  NEUTROABS  --   --  5.6  --   HGB 8.5* 7.4* 8.0* 8.3*  HCT 26.4* 22.6* 24.9* 25.4*  MCV 86.8 86.6 88.0 87.0  PLT 314 295 341 383   Cardiac Enzymes:  Recent Labs Lab 07/22/13 0215  CKTOTAL 88   BNP: No components found with this basename: POCBNP,  CBG: No results found for this basename: GLUCAP,  in the last 168 hours D-Dimer No results found for this basename: DDIMER,  in the last 72 hours Hgb A1c No results found for this basename: HGBA1C,  in the last 72 hours Lipid Profile No results found for this basename: CHOL, HDL, LDLCALC, TRIG, CHOLHDL, LDLDIRECT,  in the last 72 hours Thyroid function studies No results found for this basename: TSH, T4TOTAL, FREET3, T3FREE, THYROIDAB,  in the last 72 hours Anemia work up No results found for this basename: VITAMINB12, FOLATE, FERRITIN, TIBC, IRON, RETICCTPCT,  in the last 72 hours Microbiology No results found for this or any previous visit (from the past 240 hour(s)).   Brief H and P: For complete details please refer to admission H and P, but in brief, the patient presented with new onset paraplegia with gradual onset for several days (last time on her feet was 3 d PTA). For further details see hospital course.  Physical Exam at Discharge: BP  106/57  Pulse 104  Temp(Src) 98.2 F (36.8 C) (Oral)  Resp 16  Ht _0  (1.575 m)  Wt 100 lb 8.5 oz (45.6 kg)  BMI 18.38 kg/m2  SpO2 100%  LMP 03/02/2011 TIW:PYKDXI aged AA woman examined in bed Cardiovascular:RRR Respiratory: clear, auscultated anteriolaterally Gastrointestinal:abd distended, diminished BS, mildly but diffusely tender GU: foley in place Extremities:no peripheral edema Neuro: dense bilateral lower extremity paralysis     Hospital Course:  The patient was admitted with full paresis of both lower extremities. She had discontinued her steroids because they caused to "hallucinate." Steroids were initiall resumed but patient refused them for the stated reason. She was evaluated by [facility] who felt there was no indication for surgical intervention as no improvement would be anticipated. Her case was reviewed by rad Onc as well. The area of cord compression is in the prior radiation field, so no further radiationis feasible.  According the patient is expected to remain paralyzed. She has urinary retention and an indwelling foley has been placed, which will be permanent. Family is being instructed in the use of a hoyer lift and the patient will have a hospital bed and w/c at home in addition to [facility]. Pain is being controlled with duragesic now at 150 mcg/h TD Q72 h and morphine 30 mg po PRN. She responds well to bowel prophylaxis and will be on daily miralax and stool softeners as well as lactulose as needed. Hospice will pick up on the case tomorrow. Patient has an OOF DNR in place.  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 [place] woman  1. Status post  right mastectomy and axillary lymph node dissection November 2010 for a T3 N1(mic) Stage IIIA invasive ductal carcinoma, grade 3, estrogen and progesterone receptor positive, HER-2/neu negative, with an MIB-1 of 36%.   2. Status post adjuvant chemotherapy July to November 2011, consisting of 4 cycles of dose dense Doxorubicin and Cyclophosphamide, followed by 4 of 12 planned weekly doses of Paclitaxel given adjuvantly, discontinued secondary to peripheral neuropathy.  3. Status post radiation to the right chest wall, truncated due to problems with the patient missing appointments.  4. Tamoxifen started June of 2012, but discontinued due to nausea.  5. Metastatic disease to liver and bone pathologically documented October of 2012, estrogen receptor 100% positive, progesterone receptor 7% positive, with no HER-2 amplification; treatment in the metastatic setting has consisted of:  6. Faslodex started 03/16/2011, discontinued November 2013 with progression. Zoladex every 3 months also started on 03/16/2011, and monthly Zoledronic acid started November 2011, the Zometa I currently held due to pending dental procedures and possible extractions.  7. Status post thoracic laminectomy, T9-T11, for impending cord compression on 03/21/2011, followed by irradiation to the spine completed November 2011  8. 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 s/p palliative radiation to the Right hip clivus to 30 Gy completed 04/07/2012  10. Progression noted by scans in November 2013, at which time Faslodex was discontinued. Patient was started on Exemestane in early December, with Everolimus started in mid December, since discontinued. Also received Zoladex injections every 3 months, last given 09/05/2012. Exemestane discontinued February 2015 with progression  11. Nondisplaced right femoral neck fracture, status post right hip arthroplasty on 05/04/2012 under the care of [place].  12. Hypercalcemia with elevated serum creatinine: resolved  13. S/p palliative radiation therapy for cord compression completed 08/15/2012   14. Poorly controlled pain  15. Under hospice care, out of facility DO NOT RESUSCITATE in place  16. Severe malnutrition  17. 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 Constipation secondary to narcotics      Diet:  regular  Activity:  Bed rest; OOB with assistance; family may use Hoyer lift to help patient to w/c once family trained  Condition at Discharge:   stable  Signed: Dr. Lurline Del [phone]  07/26/2013, 5:25 PM

## 2013-07-26 NOTE — Care Management Note (Signed)
Cm spoke with patient's daughter Marijean Niemann who reports hospital bed and hoyer lift were delivered to pt's residence. Per daughter, education was provided on how to utilize equipment. Pt's daughter in route to hospital from further pt transfer education from PT. Cm telephoned MD concerning potential patient discharge today. Awating return call.    Mechele Claude.MSN,RN [phone]

## 2013-07-28 ENCOUNTER — Other Ambulatory Visit: Payer: Self-pay | Admitting: *Deleted

## 2013-07-28 MED ORDER — LACTULOSE 10 GM/15ML PO SOLN: 10.0000 g | Freq: Two times a day (BID) | ORAL | Status: DC | PRN

## 2013-07-28 MED ORDER — ONDANSETRON HCL 8 MG PO TABS: 4.0000 mg | ORAL_TABLET | Freq: Two times a day (BID) | ORAL | Status: AC | PRN

## 2013-07-28 NOTE — Telephone Encounter (Signed)
[name] RN with [facility] called wanting to clarify d/c order and medications per visit yesterday with pt.  D/c order states " bedrest " but noted pt has w/c and hoyer lift in home- need to clarify.  Flexeril was d/ced per med list but pt is requesting use PRN  D/c med list has Zofran and lactulose prn but no prescriptions in home for these medications.  Per review this RN informed [name] pt may be up with assistance only- and reiterated need for family/caregivers to understand [place] cannot be left alone per medical concern for safety.  Flexeril may be re instituted per pt's request as well as scripts for Zofran and lactulose will be sent to pharmacy.

## 2013-07-31 ENCOUNTER — Other Ambulatory Visit: Payer: Self-pay | Admitting: *Deleted

## 2013-07-31 DIAGNOSIS — C50919 Malignant neoplasm of unspecified site of unspecified female breast: Secondary | ICD-10-CM

## 2013-07-31 DIAGNOSIS — K219 Gastro-esophageal reflux disease without esophagitis: Secondary | ICD-10-CM

## 2013-07-31 DIAGNOSIS — C787 Secondary malignant neoplasm of liver and intrahepatic bile duct: Secondary | ICD-10-CM

## 2013-07-31 DIAGNOSIS — G8220 Paraplegia, unspecified: Secondary | ICD-10-CM

## 2013-07-31 DIAGNOSIS — E43 Unspecified severe protein-calorie malnutrition: Secondary | ICD-10-CM

## 2013-07-31 MED ORDER — MORPHINE SULFATE 15 MG PO TABS: 30.0000 mg | ORAL_TABLET | ORAL | Status: DC | PRN

## 2013-08-03 ENCOUNTER — Other Ambulatory Visit: Payer: Self-pay | Admitting: *Deleted

## 2013-08-03 DIAGNOSIS — G8220 Paraplegia, unspecified: Secondary | ICD-10-CM

## 2013-08-03 DIAGNOSIS — K219 Gastro-esophageal reflux disease without esophagitis: Secondary | ICD-10-CM

## 2013-08-03 DIAGNOSIS — E43 Unspecified severe protein-calorie malnutrition: Secondary | ICD-10-CM

## 2013-08-03 DIAGNOSIS — C787 Secondary malignant neoplasm of liver and intrahepatic bile duct: Secondary | ICD-10-CM

## 2013-08-03 DIAGNOSIS — C50919 Malignant neoplasm of unspecified site of unspecified female breast: Secondary | ICD-10-CM

## 2013-08-03 MED ORDER — MORPHINE SULFATE 30 MG PO TABS: 30.0000 mg | ORAL_TABLET | ORAL | Status: DC | PRN

## 2013-08-04 ENCOUNTER — Other Ambulatory Visit: Payer: Self-pay | Admitting: *Deleted

## 2013-08-04 NOTE — Telephone Encounter (Signed)
Message from hospice RN - [name]- stating [name] could only fill the morphine IR with 15mg  tablets.  [name] is requesting new script for 15mg  to be sent to [facility].  This RN called and discussed the above with Tammy PHD at [facility].  Tammy states new script not needed for above.

## 2013-08-08 ENCOUNTER — Other Ambulatory Visit: Payer: Self-pay | Admitting: *Deleted

## 2013-08-08 DIAGNOSIS — C50919 Malignant neoplasm of unspecified site of unspecified female breast: Secondary | ICD-10-CM

## 2013-08-08 DIAGNOSIS — C7951 Secondary malignant neoplasm of bone: Secondary | ICD-10-CM

## 2013-08-08 DIAGNOSIS — C7952 Secondary malignant neoplasm of bone marrow: Principal | ICD-10-CM

## 2013-08-08 DIAGNOSIS — C787 Secondary malignant neoplasm of liver and intrahepatic bile duct: Secondary | ICD-10-CM

## 2013-08-08 DIAGNOSIS — E43 Unspecified severe protein-calorie malnutrition: Secondary | ICD-10-CM

## 2013-08-08 DIAGNOSIS — G8220 Paraplegia, unspecified: Secondary | ICD-10-CM

## 2013-08-08 DIAGNOSIS — K219 Gastro-esophageal reflux disease without esophagitis: Secondary | ICD-10-CM

## 2013-08-08 MED ORDER — FENTANYL 50 MCG/HR TD PT72: MEDICATED_PATCH | TRANSDERMAL | Status: DC

## 2013-08-08 MED ORDER — FENTANYL 100 MCG/HR TD PT72: MEDICATED_PATCH | TRANSDERMAL | Status: DC

## 2013-08-08 MED ORDER — MORPHINE SULFATE 30 MG PO TABS: 30.0000 mg | ORAL_TABLET | ORAL | Status: DC | PRN

## 2013-08-08 NOTE — Telephone Encounter (Signed)
[name] with Hospice calling for refills on Duragesic 100 & 89mcg patches, and patient is also taking 2 tabs hourly for breakthrough. Called and left message to see if [name] should be re-evaluated by their staff, would it be necessary to increase to 267mcg/hr. Will wait to hear back before dispensing new Rx.

## 2013-08-08 NOTE — Telephone Encounter (Signed)
Faxed Rx to [facility].

## 2013-08-10 ENCOUNTER — Other Ambulatory Visit: Payer: Self-pay

## 2013-08-10 DIAGNOSIS — C787 Secondary malignant neoplasm of liver and intrahepatic bile duct: Principal | ICD-10-CM

## 2013-08-10 DIAGNOSIS — C50919 Malignant neoplasm of unspecified site of unspecified female breast: Secondary | ICD-10-CM

## 2013-08-10 MED ORDER — CYCLOBENZAPRINE HCL 10 MG PO TABS
10.0000 mg | ORAL_TABLET | Freq: Three times a day (TID) | ORAL | Status: DC | PRN
Start: 2013-08-10 — End: 2013-08-17

## 2013-08-10 NOTE — Telephone Encounter (Signed)
[name] will tell [name] that she will only be receiving a weeks worth of flexeril at a time.  Pt.'s last refill was 08-03-13 for 60 tabs and is out of them. [name] RN with [facility] [phone]  to call [facility] and ok to fill prescription for a weeks supply today.

## 2013-08-14 ENCOUNTER — Other Ambulatory Visit: Payer: Self-pay | Admitting: *Deleted

## 2013-08-14 DIAGNOSIS — C7951 Secondary malignant neoplasm of bone: Secondary | ICD-10-CM

## 2013-08-14 DIAGNOSIS — C50919 Malignant neoplasm of unspecified site of unspecified female breast: Secondary | ICD-10-CM

## 2013-08-14 DIAGNOSIS — C7952 Secondary malignant neoplasm of bone marrow: Secondary | ICD-10-CM

## 2013-08-14 MED ORDER — MORPHINE SULFATE 30 MG PO TABS: 30.0000 mg | ORAL_TABLET | ORAL | Status: DC | PRN

## 2013-08-14 NOTE — Telephone Encounter (Signed)
Message left by [name], RN with [facility], stating need for refill on MSIR. Pt has also taken additional flexeril and is now out of medication. Hospice assisted patient last week and obtained early fill until due date of 3/19 but has now used them up as well.  Of note pt is taking 2 tablets of flexeril at a time ( 10 mg tablets ).  This RN will obtain prescription for MSIR. Due to MD out of office - unable to refill flexeril until 3/19.

## 2013-08-17 ENCOUNTER — Other Ambulatory Visit: Payer: Self-pay | Admitting: *Deleted

## 2013-08-17 DIAGNOSIS — C7952 Secondary malignant neoplasm of bone marrow: Secondary | ICD-10-CM

## 2013-08-17 DIAGNOSIS — C50919 Malignant neoplasm of unspecified site of unspecified female breast: Secondary | ICD-10-CM

## 2013-08-17 DIAGNOSIS — C7951 Secondary malignant neoplasm of bone: Secondary | ICD-10-CM

## 2013-08-17 DIAGNOSIS — C787 Secondary malignant neoplasm of liver and intrahepatic bile duct: Principal | ICD-10-CM

## 2013-08-17 MED ORDER — CYCLOBENZAPRINE HCL 10 MG PO TABS: 10.0000 mg | ORAL_TABLET | Freq: Three times a day (TID) | ORAL | Status: AC | PRN

## 2013-08-17 MED ORDER — FENTANYL 100 MCG/HR TD PT72: MEDICATED_PATCH | TRANSDERMAL | Status: DC

## 2013-08-17 MED ORDER — FENTANYL 50 MCG/HR TD PT72: MEDICATED_PATCH | TRANSDERMAL | Status: DC

## 2013-08-21 ENCOUNTER — Other Ambulatory Visit: Payer: Self-pay | Admitting: *Deleted

## 2013-08-21 DIAGNOSIS — C7951 Secondary malignant neoplasm of bone: Secondary | ICD-10-CM

## 2013-08-21 DIAGNOSIS — C50919 Malignant neoplasm of unspecified site of unspecified female breast: Secondary | ICD-10-CM

## 2013-08-21 DIAGNOSIS — C7952 Secondary malignant neoplasm of bone marrow: Secondary | ICD-10-CM

## 2013-08-21 MED ORDER — MORPHINE SULFATE 30 MG PO TABS: 30.0000 mg | ORAL_TABLET | ORAL | Status: DC | PRN

## 2013-08-24 ENCOUNTER — Other Ambulatory Visit: Payer: Self-pay | Admitting: *Deleted

## 2013-08-24 DIAGNOSIS — C50919 Malignant neoplasm of unspecified site of unspecified female breast: Secondary | ICD-10-CM

## 2013-08-24 DIAGNOSIS — C7952 Secondary malignant neoplasm of bone marrow: Secondary | ICD-10-CM

## 2013-08-24 DIAGNOSIS — C7951 Secondary malignant neoplasm of bone: Secondary | ICD-10-CM

## 2013-08-24 MED ORDER — CAMPHOR-MENTHOL 0.5-0.5 % EX LOTN: 1.0000 "application " | TOPICAL_LOTION | CUTANEOUS | Status: AC | PRN

## 2013-08-24 MED ORDER — MORPHINE SULFATE 30 MG PO TABS: 30.0000 mg | ORAL_TABLET | ORAL | Status: DC | PRN

## 2013-08-31 ENCOUNTER — Telehealth: Payer: Self-pay | Admitting: *Deleted

## 2013-08-31 ENCOUNTER — Other Ambulatory Visit: Payer: Self-pay | Admitting: *Deleted

## 2013-08-31 ENCOUNTER — Other Ambulatory Visit: Payer: Self-pay | Admitting: Physician Assistant

## 2013-08-31 DIAGNOSIS — C50919 Malignant neoplasm of unspecified site of unspecified female breast: Secondary | ICD-10-CM

## 2013-08-31 DIAGNOSIS — C7951 Secondary malignant neoplasm of bone: Secondary | ICD-10-CM

## 2013-08-31 DIAGNOSIS — C7952 Secondary malignant neoplasm of bone marrow: Principal | ICD-10-CM

## 2013-08-31 MED ORDER — FENTANYL 100 MCG/HR TD PT72: MEDICATED_PATCH | TRANSDERMAL | Status: AC

## 2013-08-31 MED ORDER — FENTANYL 50 MCG/HR TD PT72: MEDICATED_PATCH | TRANSDERMAL | Status: AC

## 2013-08-31 MED ORDER — MORPHINE SULFATE 30 MG PO TABS: 30.0000 mg | ORAL_TABLET | ORAL | Status: AC | PRN

## 2013-08-31 NOTE — Telephone Encounter (Signed)
Received call from [facility] requesting refills of medications for pt : 1.  Fentanyl  150 mcg.   2.  Morphine  30mg   Every 1 hour as needed - pt requires almost every hour around the clock per hospice nurse. 3.  Lorazepam  1mg   Every 6 hours as needed. 4.  Flexeril  10 mg  Every  8 hours as needed. Pt uses  [facility]. [facility]  Phone     [phone].

## 2013-08-31 NOTE — Telephone Encounter (Signed)
Called Carol @ [facility] back and informed her re:  Prescriptions for  Fentanyl 153mcg and 69mcg , and  Morphine 30mg  had been faxed to [facility].  Informed [name] that pt still has 2 refills left on Flexeril ( from 3/19 ) and Ativan script available ( from 2/16 ) given by Micah Flesher, PA.    [name] voiced understanding.

## 2013-08-31 NOTE — Telephone Encounter (Signed)
Already done

## 2013-09-01 ENCOUNTER — Telehealth: Payer: Self-pay | Admitting: Oncology

## 2013-09-01 NOTE — Telephone Encounter (Signed)
S/w the pt's daughter Marijean Niemann and she is aware of the appt d/t on Monday.

## 2013-09-02 ENCOUNTER — Emergency Department (HOSPITAL_COMMUNITY)
Admission: EM | Admit: 2013-09-02 | Discharge: 2013-09-02 | Disposition: A | Discharge status: Home / Self Care | Payer: Medicaid Other | Attending: Emergency Medicine | Admitting: Emergency Medicine

## 2013-09-02 ENCOUNTER — Encounter (HOSPITAL_COMMUNITY): Payer: Self-pay | Admitting: Emergency Medicine

## 2013-09-02 DIAGNOSIS — Z87440 Personal history of urinary (tract) infections: Secondary | ICD-10-CM | POA: Insufficient documentation

## 2013-09-02 DIAGNOSIS — Z923 Personal history of irradiation: Secondary | ICD-10-CM | POA: Insufficient documentation

## 2013-09-02 DIAGNOSIS — R609 Edema, unspecified: Secondary | ICD-10-CM | POA: Insufficient documentation

## 2013-09-02 DIAGNOSIS — Z9010 Acquired absence of unspecified breast and nipple: Secondary | ICD-10-CM | POA: Insufficient documentation

## 2013-09-02 DIAGNOSIS — R160 Hepatomegaly, not elsewhere classified: Secondary | ICD-10-CM | POA: Insufficient documentation

## 2013-09-02 DIAGNOSIS — C7949 Secondary malignant neoplasm of other parts of nervous system: Secondary | ICD-10-CM

## 2013-09-02 DIAGNOSIS — Z87448 Personal history of other diseases of urinary system: Secondary | ICD-10-CM | POA: Insufficient documentation

## 2013-09-02 DIAGNOSIS — G8929 Other chronic pain: Secondary | ICD-10-CM

## 2013-09-02 DIAGNOSIS — I10 Essential (primary) hypertension: Secondary | ICD-10-CM | POA: Insufficient documentation

## 2013-09-02 DIAGNOSIS — Z515 Encounter for palliative care: Secondary | ICD-10-CM | POA: Insufficient documentation

## 2013-09-02 DIAGNOSIS — C50919 Malignant neoplasm of unspecified site of unspecified female breast: Secondary | ICD-10-CM

## 2013-09-02 DIAGNOSIS — C7931 Secondary malignant neoplasm of brain: Secondary | ICD-10-CM | POA: Insufficient documentation

## 2013-09-02 DIAGNOSIS — C787 Secondary malignant neoplasm of liver and intrahepatic bile duct: Secondary | ICD-10-CM | POA: Insufficient documentation

## 2013-09-02 DIAGNOSIS — Z853 Personal history of malignant neoplasm of breast: Secondary | ICD-10-CM | POA: Insufficient documentation

## 2013-09-02 DIAGNOSIS — Z87891 Personal history of nicotine dependence: Secondary | ICD-10-CM | POA: Insufficient documentation

## 2013-09-02 DIAGNOSIS — K219 Gastro-esophageal reflux disease without esophagitis: Secondary | ICD-10-CM | POA: Insufficient documentation

## 2013-09-02 DIAGNOSIS — C7952 Secondary malignant neoplasm of bone marrow: Secondary | ICD-10-CM

## 2013-09-02 DIAGNOSIS — Z862 Personal history of diseases of the blood and blood-forming organs and certain disorders involving the immune mechanism: Secondary | ICD-10-CM | POA: Insufficient documentation

## 2013-09-02 DIAGNOSIS — C7951 Secondary malignant neoplasm of bone: Secondary | ICD-10-CM | POA: Insufficient documentation

## 2013-09-02 DIAGNOSIS — M79609 Pain in unspecified limb: Secondary | ICD-10-CM | POA: Insufficient documentation

## 2013-09-02 DIAGNOSIS — Z8639 Personal history of other endocrine, nutritional and metabolic disease: Secondary | ICD-10-CM | POA: Insufficient documentation

## 2013-09-02 MED ORDER — MORPHINE SULFATE 30 MG PO TABS: 30.0000 mg | ORAL_TABLET | ORAL | Status: DC | PRN

## 2013-09-02 MED ORDER — MORPHINE SULFATE 30 MG PO TABS: 30.0000 mg | ORAL_TABLET | ORAL | Status: AC | PRN

## 2013-09-02 MED ORDER — FENTANYL 75 MCG/HR TD PT72
150.0000 ug | MEDICATED_PATCH | TRANSDERMAL | Status: DC
  Administered 2013-09-02: 150 ug via TRANSDERMAL
  Filled 2013-09-02: qty 2

## 2013-09-02 NOTE — ED Notes (Signed)
Pt w/ breast cancer.  C/o lt arm pain and gen body aches x 2 wks.

## 2013-09-02 NOTE — ED Provider Notes (Signed)
CSN: [id number]     Arrival date & time 09/02/13  1723 History   First MD Initiated Contact with Patient 09/02/13 1736     Chief Complaint  Patient presents with  . Generalized Body Aches  . 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 Patient reports that her pharmacy is closed due to the holiday weekend, and that she has run out of her medications. She denies new symptoms, and his only and she didn't assistance in managing her chronic pain until she can see her primary doctor on Monday.  Patient is a 48 y.o. female presenting with extremity pain.  Extremity Pain This is a chronic problem. Episode onset: worse today. The problem occurs constantly. The problem has been gradually worsening. Associated symptoms include chest pain and abdominal pain. Pertinent negatives include no shortness of breath. Nothing aggravates the symptoms. Relieved by: morphine.    Past Medical History  Diagnosis Date  . Hypertension   . GERD (gastroesophageal reflux disease)   . Pyelonephritis 10/01/11  . Recurrent UTI (urinary tract infection) 10/01/11  . S/P chemotherapy, time since 4-12 weeks   . S/P radiation > 12 weeks 03/15/12- 04/07/12    right hip, clivus  . Hypercalcemia 06/21/2012  . History of radiation therapy 08/06/10 -09/16/10    R supraclav fossa, R chest wall, R drain site  . radiaiton therapy 07/22/12 -08/15/12    T4-T7  . 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 Brain cancer     spread from breast ca  . Hospice care patient 11/09/2012    [facility] [place]-call [phone]   Past Surgical History  Procedure Laterality Date  . Cesarean  section  1989; 2010  . Mastectomy  2011    right  . Back surgery  2012    "removed tumor,cancer, from my spine  . Port-a-cath removal  2012    right chest  . Portacath placement  2011    right  . Tubal ligation  2010  . Breast biopsy  2011    right  . Hip arthroplasty  05/04/2012    Procedure: ARTHROPLASTY BIPOLAR HIP;  Surgeon: Mauri Pole, MD;  Location: [facility];  Service: Orthopedics;  Laterality: Right;  RIGHT HIP HEMI ARTHROPLASTY    Family History  Problem Relation Age of Onset  . Cancer Brother 74    died of colon cancer at age of 32  . High blood pressure Mother    History  Substance Use Topics  . Smoking status: Former Smoker -- 0.25 packs/day for 31 years    Types: Cigarettes    Quit date: 04/26/2012  . Smokeless tobacco: Never Used  . Alcohol Use: No   OB History   Grav Para Term Preterm Abortions TAB SAB Ect Mult Living                 Review of Systems  Constitutional: Negative for fever.  HENT: Negative for congestion.   Respiratory: Negative for cough and shortness of breath.   Cardiovascular: Positive for chest pain.  Gastrointestinal: Positive for nausea and abdominal pain. Negative for vomiting and diarrhea.  All other systems reviewed and  are negative.      Allergies  Review of patient's allergies indicates no known allergies.  Home Medications   Current Outpatient Rx  Name  Route  Sig  Dispense  Refill  . camphor-menthol (SARNA) lotion   Topical   Apply 1 application topically as needed for itching. HOSPICE PATIENT   222 mL   2   . cyclobenzaprine (FLEXERIL) 10 MG tablet   Oral   Take 1 tablet (10 mg total) by mouth 3 (three) times daily as needed for muscle spasms.   21 tablet   4     Patient prescribed a  weeks worth of the Flexeril  ...   . diphenhydrAMINE (BENADRYL) 25 mg capsule   Oral   Take 1 capsule (25 mg total) by mouth every 6 (six) hours as needed for itching.   30 capsule   0   . feeding supplement, ENSURE  COMPLETE, (ENSURE COMPLETE) LIQD   Oral   Take 237 mLs by mouth 3 (three) times daily with meals.   30 Bottle   12   . fentaNYL (DURAGESIC - DOSED MCG/HR) 100 MCG/HR      Apply 1(one) patch to skin q72hours, In addition to 1(one) 67mcg patch for total of 136mcg/hr x72hours   5 patch   0     HOSPICE PATIENT//2 weeks supply   . fentaNYL (DURAGESIC - DOSED MCG/HR) 50 MCG/HR      Apply 1(one) patch to skin q72hours, In addition to 1(one) 150mcg patch for total of 141mcg/hr x72hours   5 patch   0     HOSPICE PATIENT//2WEEKS SUPPLY   . guaiFENesin (ROBITUSSIN) 100 MG/5ML SOLN   Oral   Take 5 mLs (100 mg total) by mouth every 4 (four) hours as needed for cough or to loosen phlegm.   1200 mL   0   . lactulose (CEPHULAC) 10 G packet   Oral   Take 1 packet (10 g total) by mouth 3 (three) times daily.   30 each   0   . lactulose (CHRONULAC) 10 GM/15ML solution   Oral   Take 15 mLs (10 g total) by mouth 2 (two) times daily as needed for mild constipation.   240 mL   3   . LORazepam (ATIVAN) 1 MG tablet   Oral   Take 1 tablet (1 mg total) by mouth every 6 (six) hours as needed for anxiety.   30 tablet   0     Hospice Patient   . morphine (MSIR) 30 MG tablet   Oral   Take 1 tablet (30 mg total) by mouth every hour as needed for moderate pain or severe pain. PLEASE NOTE DOSAGE OF 30MG    60 tablet   0     HOSPICE PATIENT//2WEEKS SUPPLY   . omeprazole (PRILOSEC) 40 MG capsule   Oral   Take 1 capsule (40 mg total) by mouth daily.   30 capsule   3     [facility] [place] PATIENT   . ondansetron (ZOFRAN) 8 MG tablet   Oral   Take 0.5 tablets (4 mg total) by mouth 2 (two) times daily as needed for nausea or vomiting.   20 tablet   3   . ondansetron (ZOFRAN-ODT) 4 MG disintegrating tablet   Oral   Take 4 mg by mouth every 4 (four) hours as needed for nausea or vomiting.         . polyethylene glycol (MIRALAX / GLYCOLAX) packet   Oral  Take 17 g by mouth  daily.   14 each   2   . promethazine (PHENERGAN) 25 MG tablet   Oral   Take 1 tablet (25 mg total) by mouth every 6 (six) hours as needed for nausea.   30 tablet   1     [facility] [place]   . senna-docusate (SENOKOT-S) 8.6-50 MG per tablet   Oral   Take 2-4 tablets by mouth 2 (two) times daily.          BP 103/60  Pulse 115  Temp(Src) 98.6 F (37 C) (Oral)  Resp 16  SpO2 98%  LMP 03/02/2011 Physical Exam  Nursing note and vitals reviewed. Constitutional: She is oriented to person, place, and time. No distress.  Cachectic  HENT:  Head: Normocephalic and atraumatic.  Mouth/Throat: Oropharynx is clear and moist.  Eyes: Conjunctivae are normal. Pupils are equal, round, and reactive to light. No scleral icterus.  Neck: Neck supple.  Cardiovascular: Normal rate, regular rhythm, normal heart sounds and intact distal pulses.   No murmur heard. Pulmonary/Chest: Effort normal and breath sounds normal. No stridor. No respiratory distress. She has no rales.  Abdominal: Soft. Bowel sounds are normal. She exhibits no distension. There is hepatomegaly (large firm mass). There is no tenderness. There is no rigidity, no rebound and no guarding.  Musculoskeletal: Normal range of motion.  Atrophic bilateral lower extremities with trace edema.  Neurological: She is alert and oriented to person, place, and time.  Skin: Skin is warm and dry. No rash noted.  Psychiatric: She has a normal mood and affect. Her behavior is normal.    ED Course  Procedures (including critical care time) Labs Review Labs Reviewed - No data to display Imaging Review No results found.   EKG Interpretation None      MDM   Final diagnoses:  Chronic pain  Breast cancer    48 year old female with a history of metastatic cancer on hospice presenting due to a worsening of her chronic pain in the setting of running out of her pain medications.  She declined workup for alternative or acute  causes of her pain.  Her HR was consistent with prior values.  Given her usual dose of fentanyl patches (she did not have any on prior) and will give her 10 MSIR tablets until her pharmacy opens on Monday.    Houston Siren III, MD 09/02/13 2111

## 2013-09-03 ENCOUNTER — Emergency Department (HOSPITAL_COMMUNITY)
Admission: EM | Admit: 2013-09-03 | Discharge: 2013-09-03 | Disposition: A | Discharge status: Home / Self Care | Payer: Medicaid Other | Attending: Emergency Medicine | Admitting: Emergency Medicine

## 2013-09-03 ENCOUNTER — Encounter (HOSPITAL_COMMUNITY): Payer: Self-pay | Admitting: Emergency Medicine

## 2013-09-03 DIAGNOSIS — Z862 Personal history of diseases of the blood and blood-forming organs and certain disorders involving the immune mechanism: Secondary | ICD-10-CM | POA: Insufficient documentation

## 2013-09-03 DIAGNOSIS — Z9010 Acquired absence of unspecified breast and nipple: Secondary | ICD-10-CM | POA: Insufficient documentation

## 2013-09-03 DIAGNOSIS — Z87440 Personal history of urinary (tract) infections: Secondary | ICD-10-CM | POA: Insufficient documentation

## 2013-09-03 DIAGNOSIS — I10 Essential (primary) hypertension: Secondary | ICD-10-CM | POA: Insufficient documentation

## 2013-09-03 DIAGNOSIS — C7981 Secondary malignant neoplasm of breast: Secondary | ICD-10-CM | POA: Insufficient documentation

## 2013-09-03 DIAGNOSIS — R141 Gas pain: Secondary | ICD-10-CM | POA: Insufficient documentation

## 2013-09-03 DIAGNOSIS — Z87448 Personal history of other diseases of urinary system: Secondary | ICD-10-CM | POA: Insufficient documentation

## 2013-09-03 DIAGNOSIS — G893 Neoplasm related pain (acute) (chronic): Secondary | ICD-10-CM | POA: Insufficient documentation

## 2013-09-03 DIAGNOSIS — M949 Disorder of cartilage, unspecified: Principal | ICD-10-CM

## 2013-09-03 DIAGNOSIS — Z79899 Other long term (current) drug therapy: Secondary | ICD-10-CM | POA: Insufficient documentation

## 2013-09-03 DIAGNOSIS — M899 Disorder of bone, unspecified: Secondary | ICD-10-CM | POA: Insufficient documentation

## 2013-09-03 DIAGNOSIS — R142 Eructation: Secondary | ICD-10-CM | POA: Insufficient documentation

## 2013-09-03 DIAGNOSIS — Z96649 Presence of unspecified artificial hip joint: Secondary | ICD-10-CM | POA: Insufficient documentation

## 2013-09-03 DIAGNOSIS — R143 Flatulence: Secondary | ICD-10-CM

## 2013-09-03 DIAGNOSIS — R112 Nausea with vomiting, unspecified: Secondary | ICD-10-CM | POA: Insufficient documentation

## 2013-09-03 DIAGNOSIS — Z85841 Personal history of malignant neoplasm of brain: Secondary | ICD-10-CM | POA: Insufficient documentation

## 2013-09-03 DIAGNOSIS — Z8639 Personal history of other endocrine, nutritional and metabolic disease: Secondary | ICD-10-CM | POA: Insufficient documentation

## 2013-09-03 DIAGNOSIS — R000 Tachycardia, unspecified: Secondary | ICD-10-CM | POA: Insufficient documentation

## 2013-09-03 DIAGNOSIS — K219 Gastro-esophageal reflux disease without esophagitis: Secondary | ICD-10-CM | POA: Insufficient documentation

## 2013-09-03 DIAGNOSIS — R52 Pain, unspecified: Secondary | ICD-10-CM | POA: Insufficient documentation

## 2013-09-03 DIAGNOSIS — Z87891 Personal history of nicotine dependence: Secondary | ICD-10-CM | POA: Insufficient documentation

## 2013-09-03 DIAGNOSIS — Z923 Personal history of irradiation: Secondary | ICD-10-CM | POA: Insufficient documentation

## 2013-09-03 DIAGNOSIS — M898X9 Other specified disorders of bone, unspecified site: Secondary | ICD-10-CM

## 2013-09-03 MED ORDER — HYDROMORPHONE HCL PF 1 MG/ML IJ SOLN
1.0000 mg | Freq: Once | INTRAMUSCULAR | Status: AC
  Administered 2013-09-03: 1 mg via INTRAVENOUS
  Filled 2013-09-03: qty 1

## 2013-09-03 MED ORDER — MORPHINE SULFATE 30 MG PO TABS
30.0000 mg | ORAL_TABLET | ORAL | Status: AC
  Administered 2013-09-03: 30 mg via ORAL
  Filled 2013-09-03: qty 1

## 2013-09-03 MED ORDER — HYDROMORPHONE HCL PF 1 MG/ML IJ SOLN
1.0000 mg | Freq: Once | INTRAMUSCULAR | Status: DC
Start: 2013-09-03 — End: 2013-09-03

## 2013-09-03 MED ORDER — SODIUM CHLORIDE 0.9 % IV BOLUS (SEPSIS)
500.0000 mL | Freq: Once | INTRAVENOUS | Status: AC
  Administered 2013-09-03: 500 mL via INTRAVENOUS

## 2013-09-03 MED ORDER — SODIUM CHLORIDE 0.9 % IV BOLUS (SEPSIS): 500.0000 mL | Freq: Once | INTRAVENOUS | Status: DC

## 2013-09-03 MED ORDER — HYDROMORPHONE HCL PF 1 MG/ML IJ SOLN
1.0000 mg | Freq: Once | INTRAMUSCULAR | Status: AC
Start: 2013-09-03 — End: 2013-09-03
  Administered 2013-09-03: 1 mg via INTRAVENOUS
  Filled 2013-09-03: qty 1

## 2013-09-03 MED ORDER — ONDANSETRON HCL 4 MG/2ML IJ SOLN
4.0000 mg | Freq: Once | INTRAMUSCULAR | Status: AC
  Administered 2013-09-03: 4 mg via INTRAVENOUS
  Filled 2013-09-03: qty 2

## 2013-09-03 NOTE — ED Notes (Signed)
Bed: IH03 Expected date:  Expected time:  Means of arrival:  Comments: EMS Cancer/pain

## 2013-09-03 NOTE — ED Notes (Signed)
She states she has bone cancer pain; chiefly in left arm.  She states she is out of her oral morphine "and I can't get it until tomorrow from my doctor and I just can't stand the pain, so I came here".

## 2013-09-03 NOTE — ED Provider Notes (Signed)
CSN: [id number]     Arrival date & time 09/03/13  1853 History   First MD Initiated Contact with Patient 09/03/13 1912     Chief Complaint  Patient presents with  . Extremity Pain  . Cancer     (Consider location/radiation/quality/duration/timing/severity/associated sxs/prior Treatment) Patient is a 48 y.o. female presenting with extremity pain. The history is provided by the patient.  Extremity Pain This is a chronic problem. The current episode started more than 1 week ago. The problem occurs constantly. The problem has not changed since onset.Pertinent negatives include no chest pain, no headaches and no shortness of breath. Nothing aggravates the symptoms. Relieved by: morphine po. She has tried nothing for the symptoms. The treatment provided no relief.    Past Medical History  Diagnosis Date  . Hypertension   . GERD (gastroesophageal reflux disease)   . Pyelonephritis 10/01/11  . Recurrent UTI (urinary tract infection) 10/01/11  . S/P chemotherapy, time since 4-12 weeks   . S/P radiation > 12 weeks 03/15/12- 04/07/12    right hip, clivus  . Hypercalcemia 06/21/2012  . History of radiation therapy 08/06/10 -09/16/10    R supraclav fossa, R chest wall, R drain site  . radiaiton therapy 07/22/12 -08/15/12    T4-T7  . 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 Brain cancer     spread from breast ca  . Hospice care patient 11/09/2012    [facility] [place]-call [phone]   Past Surgical History  Procedure Laterality Date  . Cesarean section  1989; 2010  . Mastectomy  2011    right  . Back surgery  2012    "removed tumor,cancer, from my spine  . Port-a-cath removal  2012    right chest  . Portacath placement  2011    right  . Tubal ligation  2010  . Breast biopsy  2011    right  . Hip arthroplasty  05/04/2012    Procedure: ARTHROPLASTY BIPOLAR HIP;  Surgeon: Mauri Pole, MD;   Location: [facility];  Service: Orthopedics;  Laterality: Right;  RIGHT HIP HEMI ARTHROPLASTY    Family History  Problem Relation Age of Onset  . Cancer Brother 29    died of colon cancer at age of 89  . High blood pressure Mother    History  Substance Use Topics  . Smoking status: Former Smoker -- 0.25 packs/day for 31 years    Types: Cigarettes    Quit date: 04/26/2012  . Smokeless tobacco: Never Used  . Alcohol Use: No   OB History   Grav Para Term Preterm Abortions TAB SAB Ect Mult Living                 Review of Systems  Constitutional: Negative for fever and fatigue.  HENT: Negative for congestion and drooling.   Eyes: Negative for pain.  Respiratory: Negative for cough and shortness of breath.   Cardiovascular: Negative for chest pain.  Gastrointestinal: Positive for nausea and vomiting. Negative for diarrhea.  Genitourinary: Negative for dysuria and hematuria.  Musculoskeletal: Negative for back pain, gait problem and neck pain.  Skin: Negative for color change.  Neurological: Negative for dizziness and headaches.  Hematological: Negative for adenopathy.  Psychiatric/Behavioral: Negative for behavioral problems.  All other systems reviewed and are negative.      Allergies  Review of patient's allergies indicates no  known allergies.  Home Medications   Current Outpatient Rx  Name  Route  Sig  Dispense  Refill  . camphor-menthol (SARNA) lotion   Topical   Apply 1 application topically as needed for itching. HOSPICE PATIENT   222 mL   2   . cyclobenzaprine (FLEXERIL) 10 MG tablet   Oral   Take 1 tablet (10 mg total) by mouth 3 (three) times daily as needed for muscle spasms.   21 tablet   4     Patient prescribed a  weeks worth of the Flexeril  ...   . diphenhydrAMINE (BENADRYL) 25 mg capsule   Oral   Take 1 capsule (25 mg total) by mouth every 6 (six) hours as needed for itching.   30 capsule   0   . feeding supplement, ENSURE COMPLETE, (ENSURE  COMPLETE) LIQD   Oral   Take 237 mLs by mouth 3 (three) times daily with meals.   30 Bottle   12   . fentaNYL (DURAGESIC - DOSED MCG/HR) 100 MCG/HR      Apply 1(one) patch to skin q72hours, In addition to 1(one) 43mcg patch for total of 124mcg/hr x72hours   5 patch   0     HOSPICE PATIENT//2 weeks supply   . fentaNYL (DURAGESIC - DOSED MCG/HR) 50 MCG/HR      Apply 1(one) patch to skin q72hours, In addition to 1(one) 153mcg patch for total of 158mcg/hr x72hours   5 patch   0     HOSPICE PATIENT//2WEEKS SUPPLY   . LORazepam (ATIVAN) 1 MG tablet   Oral   Take 1 tablet (1 mg total) by mouth every 6 (six) hours as needed for anxiety.   30 tablet   0     Hospice Patient   . morphine (MSIR) 30 MG tablet   Oral   Take 1 tablet (30 mg total) by mouth every hour as needed for moderate pain or severe pain. PLEASE NOTE DOSAGE OF 30MG    60 tablet   0     HOSPICE PATIENT//2WEEKS SUPPLY   . morphine (MSIR) 30 MG tablet   Oral   Take 1 tablet (30 mg total) by mouth every 4 (four) hours as needed for severe pain.   10 tablet   0   . omeprazole (PRILOSEC) 40 MG capsule   Oral   Take 1 capsule (40 mg total) by mouth daily.   30 capsule   3     [facility] [place] PATIENT   . ondansetron (ZOFRAN) 8 MG tablet   Oral   Take 0.5 tablets (4 mg total) by mouth 2 (two) times daily as needed for nausea or vomiting.   20 tablet   3   . senna-docusate (SENOKOT-S) 8.6-50 MG per tablet   Oral   Take 2-4 tablets by mouth 2 (two) times daily.          BP 134/76  Pulse 109  Temp(Src) 98.6 F (37 C) (Oral)  Resp 20  SpO2 96%  LMP 03/02/2011 Physical Exam  Nursing note and vitals reviewed. Constitutional: She is oriented to person, place, and time.  cachectic  HENT:  Head: Normocephalic.  Mouth/Throat: No oropharyngeal exudate.  Eyes: Conjunctivae and EOM are normal. Pupils are equal, round, and reactive to light.  Neck: Normal range of motion. Neck supple.   Cardiovascular: Regular rhythm, normal heart sounds and intact distal pulses.  Exam reveals no gallop and no friction rub.   No murmur heard. HR 109  Pulmonary/Chest: Effort normal and breath sounds normal. No respiratory distress. She has no wheezes.  Abdominal: Bowel sounds are normal. She exhibits distension (moderate). There is no tenderness. There is no rebound and no guarding.  Musculoskeletal: Normal range of motion. She exhibits no edema and no tenderness.  Neurological: She is alert and oriented to person, place, and time.  Skin: Skin is warm and dry.  Psychiatric: She has a normal mood and affect. Her behavior is normal.    ED Course  Procedures (including critical care time) Labs Review Labs Reviewed - No data to display Imaging Review No results found.   EKG Interpretation None      MDM   Final diagnoses:  Bone pain    7:42 PM 48 y.o. female with history of metastatic breast cancer who presents with diffuse pain. The patient was seen yesterday and given a prescription for morphine tablets. She states that she was unable to get her normal pain prescriptions due to it being a holiday weekend and the pharmacy was closed. She states that she was unable to fill the new prescription for the morphine tablets she was given yesterday as her other prescription was pending and the pharmacy would not fill the new Rx. She notes nausea and some retching today. She denies any fevers. She is afebrile and mildly tachycardic here. She states that she is with hospice but has been unable to get a hold of them. She would like IV fluid and pain control. Will try to contact hospice.   I spoke w/ nurse on call for hospice Janett Billow). She states that their notes show that they are aware she was having difficulty getting her pain meds, but the pt was not answering when they were calling. Pt's pain controlled here. She is happy w/ d/c home and plans to get her pain meds when the pharmacy opens  tomorrow.   10:03 PM:  I have discussed the diagnosis/risks/treatment options with the patient and believe the pt to be eligible for discharge home to follow-up with pcp as needed. We also discussed returning to the ED immediately if new or worsening sx occur. We discussed the sx which are most concerning (e.g., worsening pain, fever) that necessitate immediate return. Medications administered to the patient during their visit and any new prescriptions provided to the patient are listed below.  Medications given during this visit Medications  morphine (MSIR) tablet 30 mg (not administered)  sodium chloride 0.9 % bolus 500 mL (0 mLs Intravenous Stopped 09/03/13 2102)  HYDROmorphone (DILAUDID) injection 1 mg (1 mg Intravenous Given 09/03/13 2000)  ondansetron (ZOFRAN) injection 4 mg (4 mg Intravenous Given 09/03/13 2000)  HYDROmorphone (DILAUDID) injection 1 mg (1 mg Intravenous Given 09/03/13 2103)  morphine (MSIR) tablet 30 mg (30 mg Oral Given 09/03/13 2123)    Discharge Medication List as of 09/03/2013 10:02 PM       Shea Evans Ruthe Mannan, MD 09/04/13 1109

## 2013-09-04 ENCOUNTER — Other Ambulatory Visit: Payer: Self-pay | Admitting: *Deleted

## 2013-09-04 ENCOUNTER — Telehealth: Payer: Self-pay | Admitting: Oncology

## 2013-09-04 ENCOUNTER — Ambulatory Visit (HOSPITAL_BASED_OUTPATIENT_CLINIC_OR_DEPARTMENT_OTHER): Payer: Medicaid Other | Admitting: Oncology

## 2013-09-04 VITALS — BP 111/71 | HR 112 | Temp 98.2°F | Resp 20 | Ht 62.0 in | Wt 110.2 lb

## 2013-09-04 DIAGNOSIS — C50919 Malignant neoplasm of unspecified site of unspecified female breast: Secondary | ICD-10-CM

## 2013-09-04 DIAGNOSIS — E876 Hypokalemia: Secondary | ICD-10-CM

## 2013-09-04 DIAGNOSIS — M549 Dorsalgia, unspecified: Secondary | ICD-10-CM

## 2013-09-04 DIAGNOSIS — C787 Secondary malignant neoplasm of liver and intrahepatic bile duct: Principal | ICD-10-CM

## 2013-09-04 DIAGNOSIS — C50519 Malignant neoplasm of lower-outer quadrant of unspecified female breast: Secondary | ICD-10-CM

## 2013-09-04 DIAGNOSIS — K5900 Constipation, unspecified: Secondary | ICD-10-CM

## 2013-09-04 DIAGNOSIS — G8220 Paraplegia, unspecified: Secondary | ICD-10-CM

## 2013-09-04 DIAGNOSIS — G893 Neoplasm related pain (acute) (chronic): Secondary | ICD-10-CM

## 2013-09-04 DIAGNOSIS — I10 Essential (primary) hypertension: Secondary | ICD-10-CM

## 2013-09-04 DIAGNOSIS — R748 Abnormal levels of other serum enzymes: Secondary | ICD-10-CM

## 2013-09-04 DIAGNOSIS — R109 Unspecified abdominal pain: Secondary | ICD-10-CM

## 2013-09-04 DIAGNOSIS — R188 Other ascites: Secondary | ICD-10-CM

## 2013-09-04 DIAGNOSIS — C7951 Secondary malignant neoplasm of bone: Secondary | ICD-10-CM

## 2013-09-04 DIAGNOSIS — Z170 Estrogen receptor positive status [ER+]: Secondary | ICD-10-CM

## 2013-09-04 DIAGNOSIS — C7952 Secondary malignant neoplasm of bone marrow: Secondary | ICD-10-CM

## 2013-09-04 DIAGNOSIS — Z923 Personal history of irradiation: Secondary | ICD-10-CM

## 2013-09-04 NOTE — Progress Notes (Signed)
[place]  Telephone:(336) [phone] Fax:(336) [phone]  OFFICE PROGRESS NOTE   ID: [name]   DOB: April 07, 1966  MR#: [id number]  PRX#:[id number]  PCP: Pcp Not In System   HISTORY OF PRESENT ILLNESS: The patient delivered a child on 05/17/2009, after which she felt fullness in the right breast. She eventually brought this to her doctors attention and was scheduled for mammography and ultrasound on 03/08/2009. There were no prior exams for comparison. Mammography showed the breast to be dense, with a 5 cm obscured mass in the outer midportion of the right breast. Mass was easily palpable, and there was also a mass in the outer right periareolar region. They were suspicious microcalcifications in the outer midportion of the right breast, as well as within the mass itself. Ultrasound showed an extensive heterogeneous area, corresponding to the palpable abnormality with multiple solid and cystic foci. Overall, the mass measured in excess of 5 cm, in addition to cysts in the breast.   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 The tumors were ER +100%, PR +80%. Bilateral breast MRI was obtained 03/22/2009. There was a large area of abnormal enhancement in the lower outer quadrant of the right breast measuring up to 9.7 cm. No abnormal appearing lymph nodes and no other masses in either breast.  Patient underwent definitive right mastectomy and sentinel lymph node sampling on 04/29/2009 under the care of Dr. Dalbert Batman. 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 Repeat prognostic panel showed tumor to be ER +93% and PR +51%. HER-2/neu was negative with a ratio of 0.71, with proliferation marker of 36%.   Patient was evaluated by Dr. Jana Hakim in January 2011 but failed to keep appointments  until she was seen here again in July 2011. In July, she began adjuvant chemotherapy. She received 4 cycles of dose dense doxorubicin and cyclophosphamide followed by 4 weekly doses of paclitaxel which was discontinued in November 2011 secondary to peripheral neuropathy. The patient then received radiation therapy to the right chest wall which was truncated due to problems with missing appointments.   Patient was started on Tamoxifen briefly in June of 2012 which was discontinued due to nausea. The nausea continued, and an abdominal CT in October 2012 confirmed a growth in liver lesions. These were biopsied on 03/10/2011 and showed metastatic adenocarcinoma, still ER positive at 100%, PR +5%. Her subsequent treatments are as detailed below  INTERVAL HISTORY: [name] returns today accompanied by her daughter [name]. [name] is under hospice care in her own home. Her daughter works from 6-11 every morning at [employer]. The daughter's boyfriend is there during that time. The patient's 54-year-old grandson is there as well.   REVIEW OF SYSTEMS: [name] continues to decline. She cannot stand. She is developing bedsores. She has been to the emergency room a number of times, and also has missed several appointments here. There have been some irregularities with the pain medicine, but in general this has been well regulated with hospices help. The patient denies headaches. She had recent cataract surgery on the left. She tells me she sees well with her right eye. She denies nausea or vomiting. She denies cough or phlegm production. His chief site of pain is the left upper extremity. She denies constipation but also tells me "they have deployed out of it" every couple of days. A detailed  review of systems was otherwise stable   PAST MEDICAL HISTORY: Past Medical History  Diagnosis Date  . Hypertension   . GERD (gastroesophageal reflux disease)   . Pyelonephritis 10/01/11  . Recurrent UTI (urinary tract  infection) 10/01/11  . S/P chemotherapy, time since 4-12 weeks   . S/P radiation > 12 weeks 03/15/12- 04/07/12    right hip, clivus  . Hypercalcemia 06/21/2012  . History of radiation therapy 08/06/10 -09/16/10    R supraclav fossa, R chest wall, R drain site  . radiaiton therapy 07/22/12 -08/15/12    T4-T7  . 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 Brain cancer     spread from breast ca  . Hospice care patient 11/09/2012    [facility] [place]-call [phone]    PAST SURGICAL HISTORY: Past Surgical History  Procedure Laterality Date  . Cesarean section  1989; 2010  . Mastectomy  2011    right  . Back surgery  2012    "removed tumor,cancer, from my spine  . Port-a-cath removal  2012    right chest  . Portacath placement  2011    right  . Tubal ligation  2010  . Breast biopsy  2011    right  . Hip arthroplasty  05/04/2012    Procedure: ARTHROPLASTY BIPOLAR HIP;  Surgeon: Mauri Pole, MD;  Location: [facility];  Service: Orthopedics;  Laterality: Right;  RIGHT HIP HEMI ARTHROPLASTY     FAMILY HISTORY Family History  Problem Relation Age of Onset  . Cancer Brother 57    died of colon cancer at age of 36  . High blood pressure Mother   The patient's father is alive at age 54.  The patient's mother is alive at age 59.  She has four sisters.  She had one brother who died from colon cancer at the age of 67.  There is no breast or ovarian cancer history in the family to her knowledge.   GYNECOLOGIC HISTORY: She is GX P2.  First pregnancy to term at age 9.    SOCIAL HISTORY: The patient is not employed.  She lives by herself (her small son whose name is [name] lives with his dad in [place]).  Her first child, [name], lives in this area. She and her daughter are not in frequent contact, but the patient states "we do talk". The patient has one grandchild.  She attends the [employer].     ADVANCED DIRECTIVES: At our facility DO NOT RESUSCITATE is in place  HEALTH MAINTENANCE: History  Substance Use Topics  . Smoking status: Former Smoker -- 0.25 packs/day for 31 years    Types: Cigarettes    Quit date: 04/26/2012  . Smokeless tobacco: Never Used  . Alcohol Use: No     Colonoscopy: Not on file  PAP: Not on file  Bone density: Not on file  Lipid panel: Not on file  No Known Allergies  Current Outpatient Prescriptions  Medication Sig Dispense Refill  . camphor-menthol (SARNA) lotion Apply 1 application topically as needed for itching. HOSPICE PATIENT  222 mL  2  . cyclobenzaprine (FLEXERIL) 10 MG tablet Take 1 tablet (10 mg total) by mouth 3 (three) times daily as needed for muscle spasms.  21 tablet  4  . diphenhydrAMINE (BENADRYL) 25 mg capsule Take 1 capsule (25 mg total) by mouth every 6 (six)  hours as needed for itching.  30 capsule  0  . feeding supplement, ENSURE COMPLETE, (ENSURE COMPLETE) LIQD Take 237 mLs by mouth 3 (three) times daily with meals.  30 Bottle  12  . fentaNYL (DURAGESIC - DOSED MCG/HR) 100 MCG/HR Apply 1(one) patch to skin q72hours, In addition to 1(one) 4mg patch for total of 1564m/hr x72hours  5 patch  0  . fentaNYL (DURAGESIC - DOSED MCG/HR) 50 MCG/HR Apply 1(one) patch to skin q72hours, In addition to 1(one) 10035mpatch for total of 150m57mr x72hours  5 patch  0  . LORazepam (ATIVAN) 1 MG tablet Take 1 tablet (1 mg total) by mouth every 6 (six) hours as needed for anxiety.  30 tablet  0  . morphine (MSIR) 30 MG tablet Take 1 tablet (30 mg total) by mouth every hour as needed for moderate pain or severe pain. PLEASE NOTE DOSAGE OF 30MG  60 tablet  0  . morphine (MSIR) 30 MG tablet Take 1 tablet (30 mg total) by mouth every 4 (four) hours as needed for severe pain.  10 tablet  0  . omeprazole (PRILOSEC) 40 MG capsule Take 1 capsule (40 mg total) by mouth daily.  30 capsule  3  . ondansetron (ZOFRAN) 8 MG tablet Take 0.5 tablets (4 mg  total) by mouth 2 (two) times daily as needed for nausea or vomiting.  20 tablet  3  . senna-docusate (SENOKOT-S) 8.6-50 MG per tablet Take 2-4 tablets by mouth 2 (two) times daily.      . [DISCONTINUED] Alum & Mag Hydroxide-Simeth (MAGIC MOUTHWASH W/LIDOCAINE) SOLN Take 5 mLs by mouth 3 (three) times daily as needed (take 15 minutes prior to meals.).  480 mL  0   No current facility-administered medications for this visit.    OBJECTIVE: Middle-aged [place] woman w examined in a wheelchair; she was weighed on a higher lift today  Filed Vitals:   09/04/13 1221  BP: 111/71  Pulse: 112  Temp: 98.2 F (36.8 C)  Resp: 20     Body mass index is 20.15 kg/(m^2).    ECOG: 2 Filed Weights   09/04/13 1221  Weight: 110 lb 3.2 oz (49.986 kg)   Left ptosis. The right pupil is round and reactive.  Oropharynx is dry otherwise clear  Lungs no crackles or wheezes Heart regular rate and rhythm The abdomen is distended, with massive splenomegaly. There are diminished bowel sounds.  Musculoskeletal exam shows no focal spinal tenderness. There is a Foley in place with cloudy urine in the bag. There is some leakage and a Foley is being changed today. The patient is alert and oriented x3. Affect was tearful today    LAB RESULTS: Lab Results  Component Value Date   WBC 6.6 07/25/2013   NEUTROABS 5.6 07/24/2013   HGB 8.3* 07/25/2013   HCT 25.4* 07/25/2013   MCV 87.0 07/25/2013   PLT 383 07/25/2013      Chemistry      Component Value Date/Time   NA 135* 07/25/2013 0340   NA 136 07/04/2013 1428   K 4.1 07/25/2013 0340   K 3.7 07/04/2013 1428   CL 97 07/25/2013 0340   CL 98 10/31/2012 1427   CO2 24 07/25/2013 0340   CO2 20* 07/04/2013 1428   BUN 21 07/25/2013 0340   BUN 13.9 07/04/2013 1428   CREATININE 0.59 07/25/2013 0340   CREATININE 0.8 07/04/2013 1428      Component Value Date/Time   CALCIUM 9.8 07/25/2013 0340  CALCIUM 11.2* 07/04/2013 1428   ALKPHOS 941* 07/23/2013 0637   ALKPHOS 1,083*  07/04/2013 1428   AST 102* 07/23/2013 0637   AST 88* 07/04/2013 1428   ALT 37* 07/23/2013 0637   ALT 44 07/04/2013 1428   BILITOT 0.7 07/23/2013 0637   BILITOT 1.51* 07/04/2013 1428       Lab Results  Component Value Date   LABCA2 89* 06/09/2012    STUDIES: MRI THORACIC SPINE WITHOUT AND WITH CONTRAST  TECHNIQUE:  Multiplanar and multiecho pulse sequences of the thoracic spine were  obtained without and with intravenous contrast.  CONTRAST: 62m MULTIHANCE GADOBENATE DIMEGLUMINE 529 MG/ML IV SOLN  COMPARISON: 10/26/2012  FINDINGS:  Extensive osseous metastatic disease is again demonstrated  throughout the thoracic and lumbar spines. There is also extensive  rib disease and extensive intra-abdominal tumor/metastatic liver  disease.  There is new epidural tumor on the left at T2-3 also involving the  left nerve foramen.  There is extensive epidural tumor beginning at the T4-5 level with  extensive tumor in the left T5 pedicle extending into the spinal  canal and displacing the thoracic spinal cord to the right. This is  significantly progressive when compared to the prior study. I do not  see any obvious cord tumor or abnormal enhancement. However, there  is cord edema at the T4 level. This epidural tumor continues down to  the bottom of T6.  There is tumor in the right side of the canal at T8 without  significant mass effect on the spinal cord. This is progressive  since the prior MRI.  There is also tumor in the right T9-10 neural foramen and extensive  involvement of the posterior elements at T9 and T10. This is  progressive.  Posterior tumor in the spinal canal at T12 and L1 is overall stable.  IMPRESSION:  Progressive epidural tumor and osseous metastatic disease since the  prior MRI. There is significant compression of the thoracic cord at  the T4 and T5 levels with edema like cord signal abnormality just  above the tumor.  Electronically Signed  By: MKalman JewelsM.D.  On:  07/24/2013 14:21   CLINICAL DATA: New onset of bilateral leg weakness and numbness.  Known metastatic breast cancer. Paraplegia.  EXAM:  MRI LUMBAR SPINE WITHOUT AND WITH CONTRAST  TECHNIQUE:  Multiplanar and multiecho pulse sequences of the lumbar spine were  obtained without and with intravenous contrast.  CONTRAST: 8 cc MultiHance  COMPARISON: Radiographs dated 07/22/2013 and MRI dated 10/26/2012  FINDINGS:  Tip of the conus is at L1. The patient has extensive metastatic  disease in the liver, markedly progressed since the prior study with  inferior displacement of the right kidney.  There is increased tumor in the posterior elements of T12 which  compresses the posterior aspect of the thecal sac but does not  compress the spinal cord. Tumor extends into the right neural  foramen at L1-2 but this is unchanged. The tumor surrounding the  thecal sac at L1 seen on the prior study has diminished.  There are numerous metastatic lesions throughout the lumbar spine  but there is no other extension of tumor into the spinal canal.  There are no pathologic fractures. There are numerous metastatic  lesions in the pelvic bones and sacrum. Those lesions have  progressed.  IMPRESSION:  1. Decreased tumor surrounding the thecal sac at L1 since the prior  study.  2. Increased tumor in the posterior elements of T12 slightly  compressing the posterior  aspect of the thecal sac but without focal  neural impingement.  3. Marked progression of metastatic disease in the liver with  increased metastases in the spine and sacrum and pelvic bones.  4. No neural impingement to explain the patient's urinary retention  and right leg weakness.  Electronically Signed  By: Rozetta Nunnery M.D.  On: 07/22/2013 10:46             External Result Report   MRI HEAD WITHOUT AND WITH CONTRAST  TECHNIQUE:  Multiplanar, multiecho pulse sequences of the brain and surrounding  structures were obtained without  and with intravenous contrast.  CONTRAST: 63m MULTIHANCE GADOBENATE DIMEGLUMINE 529 MG/ML IV SOLN  COMPARISON: MRI 03/21/2013  FINDINGS:  Expansile mass in the clivus has progressed and is compatible with  metastatic disease. New area of metastatic disease in the C2  vertebral body on the right. No epidural tumor in the spinal canal.  C4 and C5 not imaged on today's study. Calvarium remains  heterogeneous which could represent bony metastatic disease.  Negative for acute infarct. Hyperintensity in the pons has  progressed and may be due to chemotherapy or radiation. Small white  matter hyperintensities in the frontal lobes are present which are  unchanged.  Negative for mass or edema. Postcontrast imaging reveals normal  enhancement. The patient did not hold still for the postcontrast  images which could obscure small enhancing lesions.  IMPRESSION:  Metastatic disease to the clivus has progressed. New area of  metastatic disease in the C2 vertebral body.  Negative for brain metastasis.  Electronically Signed  By: CFranchot GalloM.D.  On: 07/22/2013 10:49   ASSESSMENT:  48y.o.  [place] woman   1.  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   2. Status post adjuvant chemotherapy July to November 2011, consisting of 4 cycles of dose dense Doxorubicin and Cyclophosphamide, followed by 4 of 12 planned weekly doses of Paclitaxel given adjuvantly, discontinued secondary to peripheral neuropathy.   3. Status post radiation to the right chest wall, truncated due to problems with the patient missing appointments.  4. Tamoxifen started June of 2012, but discontinued due to nausea.   5. Metastatic disease to liver and bone pathologically documented October of 2012, estrogen receptor 100% positive,  progesterone receptor 7% positive, with no HER-2 amplification;  treatment in the metastatic setting has consisted of:  6.  Faslodex started 03/16/2011, discontinued November 2013 with progression. Zoladex every 3 months also started on 03/16/2011, and monthly Zoledronic acid started November 2011, the Zometa I currently held due to pending dental procedures and possible extractions.            7. Status post thoracic laminectomy, T9-T11, for impending cord compression on 03/21/2011, followed by irradiation to the spine completed November 2011  8. 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 s/p palliative radiation to the Right hip clivus to 30 Gy completed 04/07/2012  10.  Progression noted by scans in November 2013, at which time Faslodex was discontinued. Patient was started on Exemestane in early December, with Everolimus started in mid December at 5 mg daily.  Continues to receive Zoladex injections every 3 months, last given 09/05/2012.  11. Nondisplaced right femoral neck fracture, status post right hip arthroplasty  on 05/04/2012 under the care of Fontanet.  12. Hypercalcemia with elevated serum creatinine: resolved  13.  S/p palliative radiation therapy for cord compression completed 08/15/2012  14. Poorly controlled pain  15. Under hospice care, out of facility DO NOT RESUSCITATE in place     PLAN:  [name] continues to decline. It is hard for me to understand how she has been able to stay home this long, but she has a very strong will and her daughter has made extraordinary efforts within a very complex and difficult situation.  her Foley is leaking and it will be changed today in our office. She is asking to be transferred to become place.  I think it would be a good move on all counts. I have discussed it with [facility] and they will offer her a bad as soon as  one becomes available. She can be transferred directly from her home. They well alert me when that occurs so that I can follow her there.  Otherwise I have tentatively made her a return appointment here for 6 weeks. We are continuing her pain medicines as before. [name] should not be resuscitated in case of a terminal event.   Chauncey Cruel, MD  09/04/2013 12:59 PM

## 2013-09-04 NOTE — Telephone Encounter (Signed)
S/w the pt and she is aware of her 10/17/2013 appts at 1:15pm.

## 2013-09-04 NOTE — Progress Notes (Signed)
Patient in office today for MD visit. Small amount of urine noted in urine bag, current leakage noted on floor. Patient placed on exam table, using sterile technique, new 16FR BARD Foley cath inserted without difficulty. Small amount of urine noted in bag after insertion.

## 2013-09-06 ENCOUNTER — Ambulatory Visit: Admitting: Physician Assistant

## 2013-09-22 ENCOUNTER — Telehealth: Payer: Self-pay | Admitting: *Deleted

## 2013-09-22 NOTE — Telephone Encounter (Signed)
Rec'd notice that patient expired at [facility] 2013-10-03 @ 1125pm.

## 2013-09-29 DEATH — deceased

## 2013-10-17 ENCOUNTER — Ambulatory Visit: Admitting: Physician Assistant

## 2013-10-17 ENCOUNTER — Other Ambulatory Visit

## 2014-01-23 ENCOUNTER — Other Ambulatory Visit: Payer: Self-pay | Admitting: Pharmacist

## 2014-06-01 IMAGING — CR DG PORTABLE PELVIS
1 series · 2 of 2 positions shown · non-contrast
Comparison: None.

CLINICAL DATA: Status post right hip arthroplasty.

PORTABLE PELVIS

[Series 1: lateral · U · 2 of 2 slices shown]
[im 1/2]
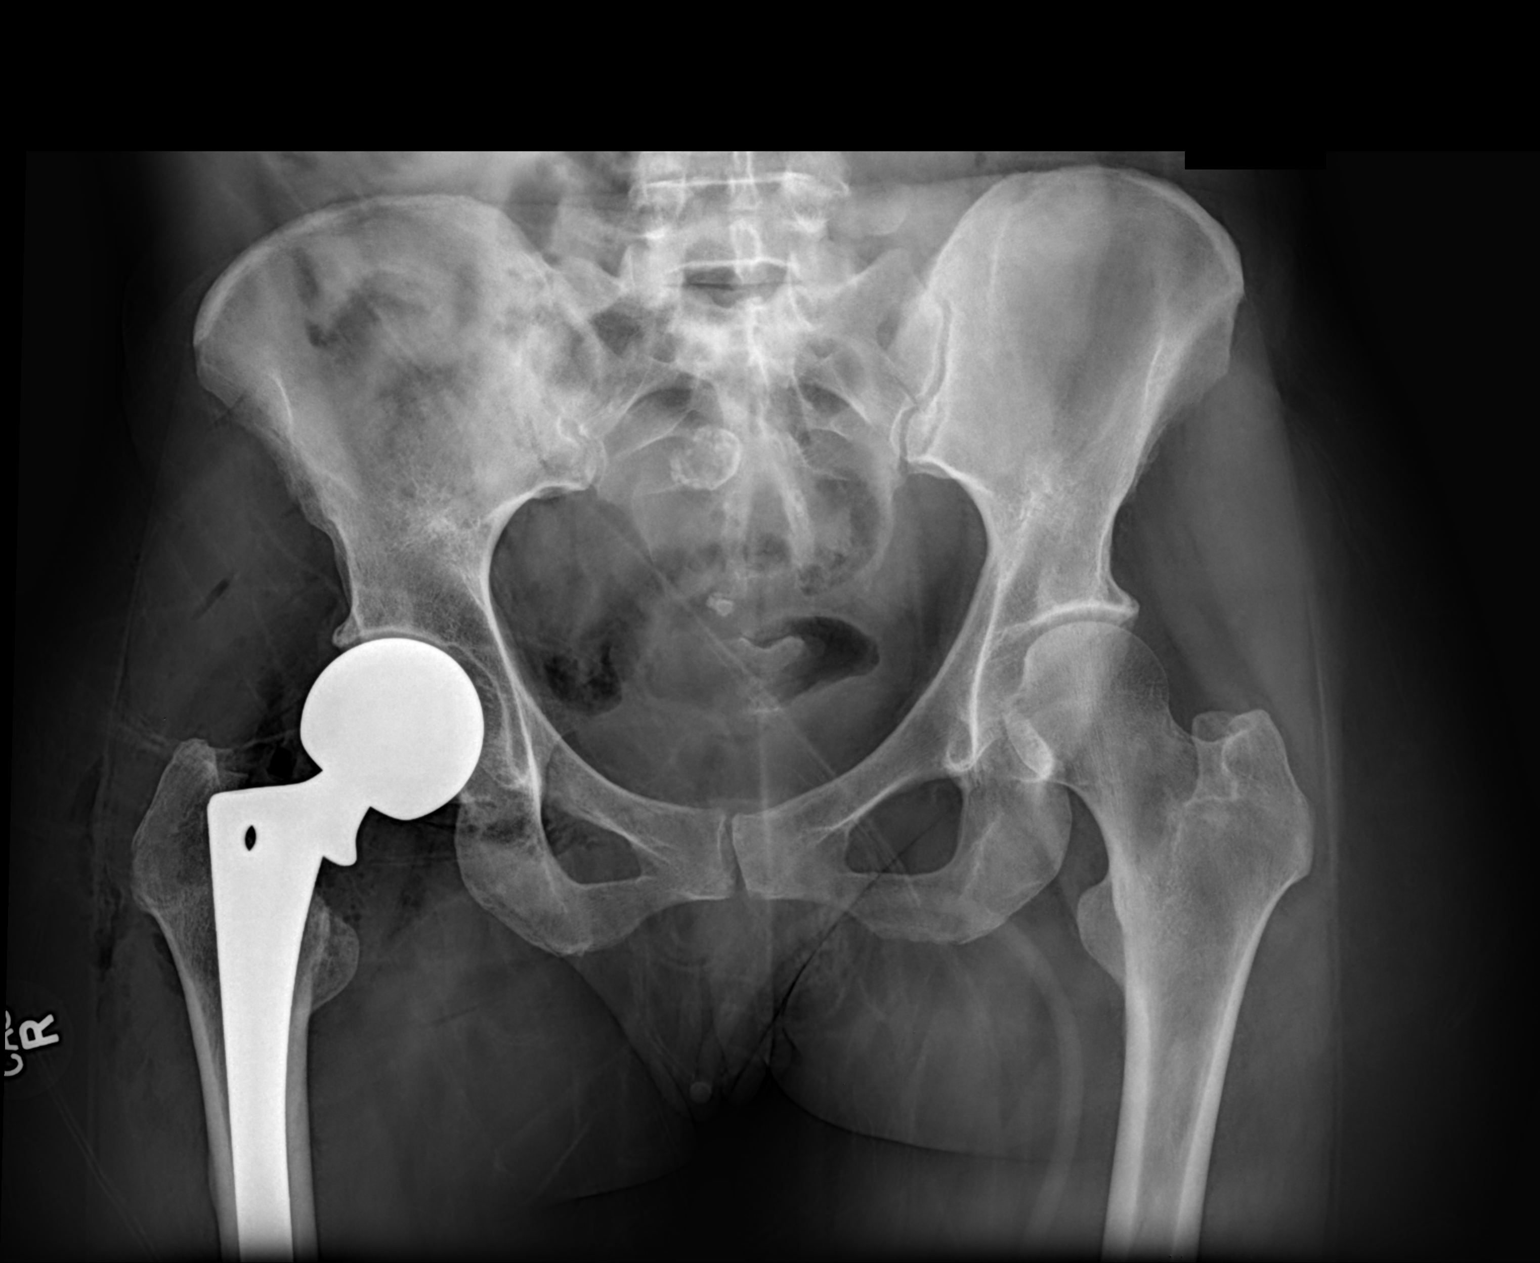
[im 2/2]
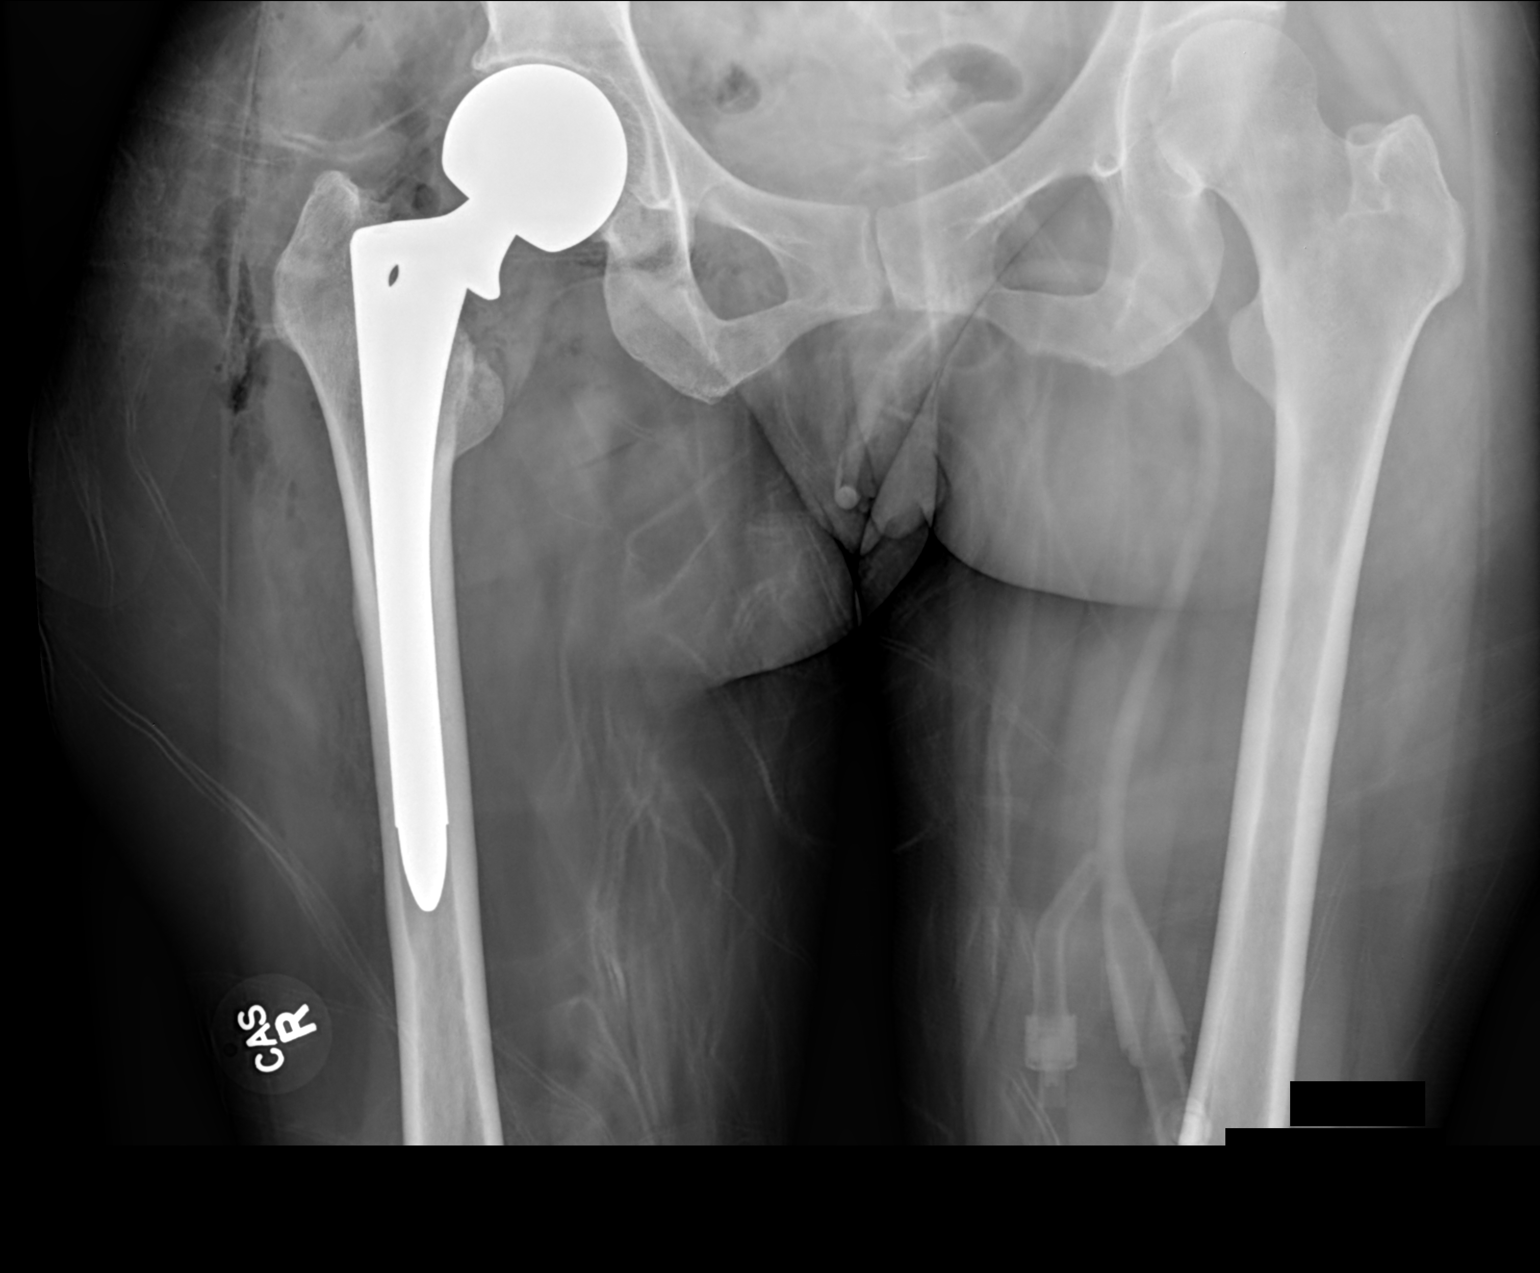

[2 of 2 positions shown; findings below may reference images not displayed]

FINDINGS: Right hip bipolar hemiarthroplasty shows normal
alignment.  No postoperative complications are evident.
IMPRESSION: Normal alignment of right hip arthroplasty.

## 2014-09-30 ENCOUNTER — Encounter: Payer: Self-pay | Admitting: *Deleted

## 2015-07-23 IMAGING — CR DG SHOULDER 2+V*L*
2 series · 2 of 2 positions shown · non-contrast
Comparison: 02/02/2012

CLINICAL DATA: Abdominal in shoulder pain

EXAM:
LEFT SHOULDER - 2+ VIEW

[x shoulder ap left]
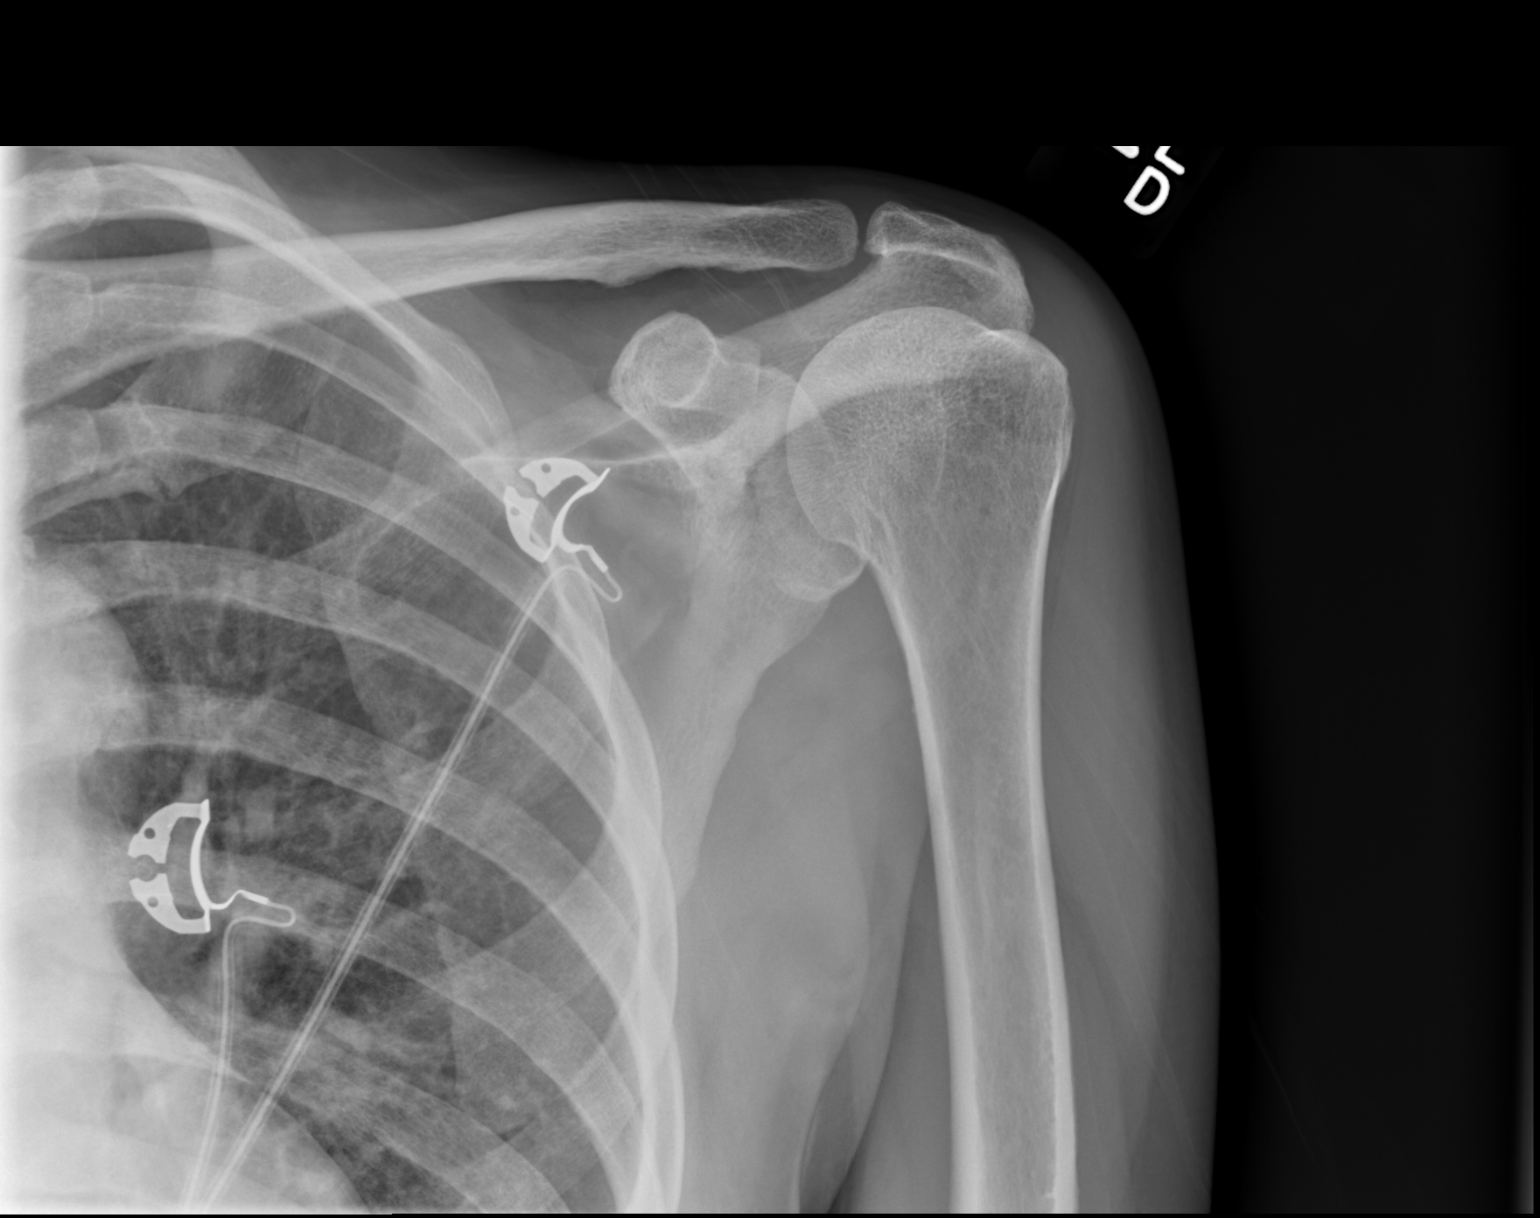

[x shoulder axillary left]
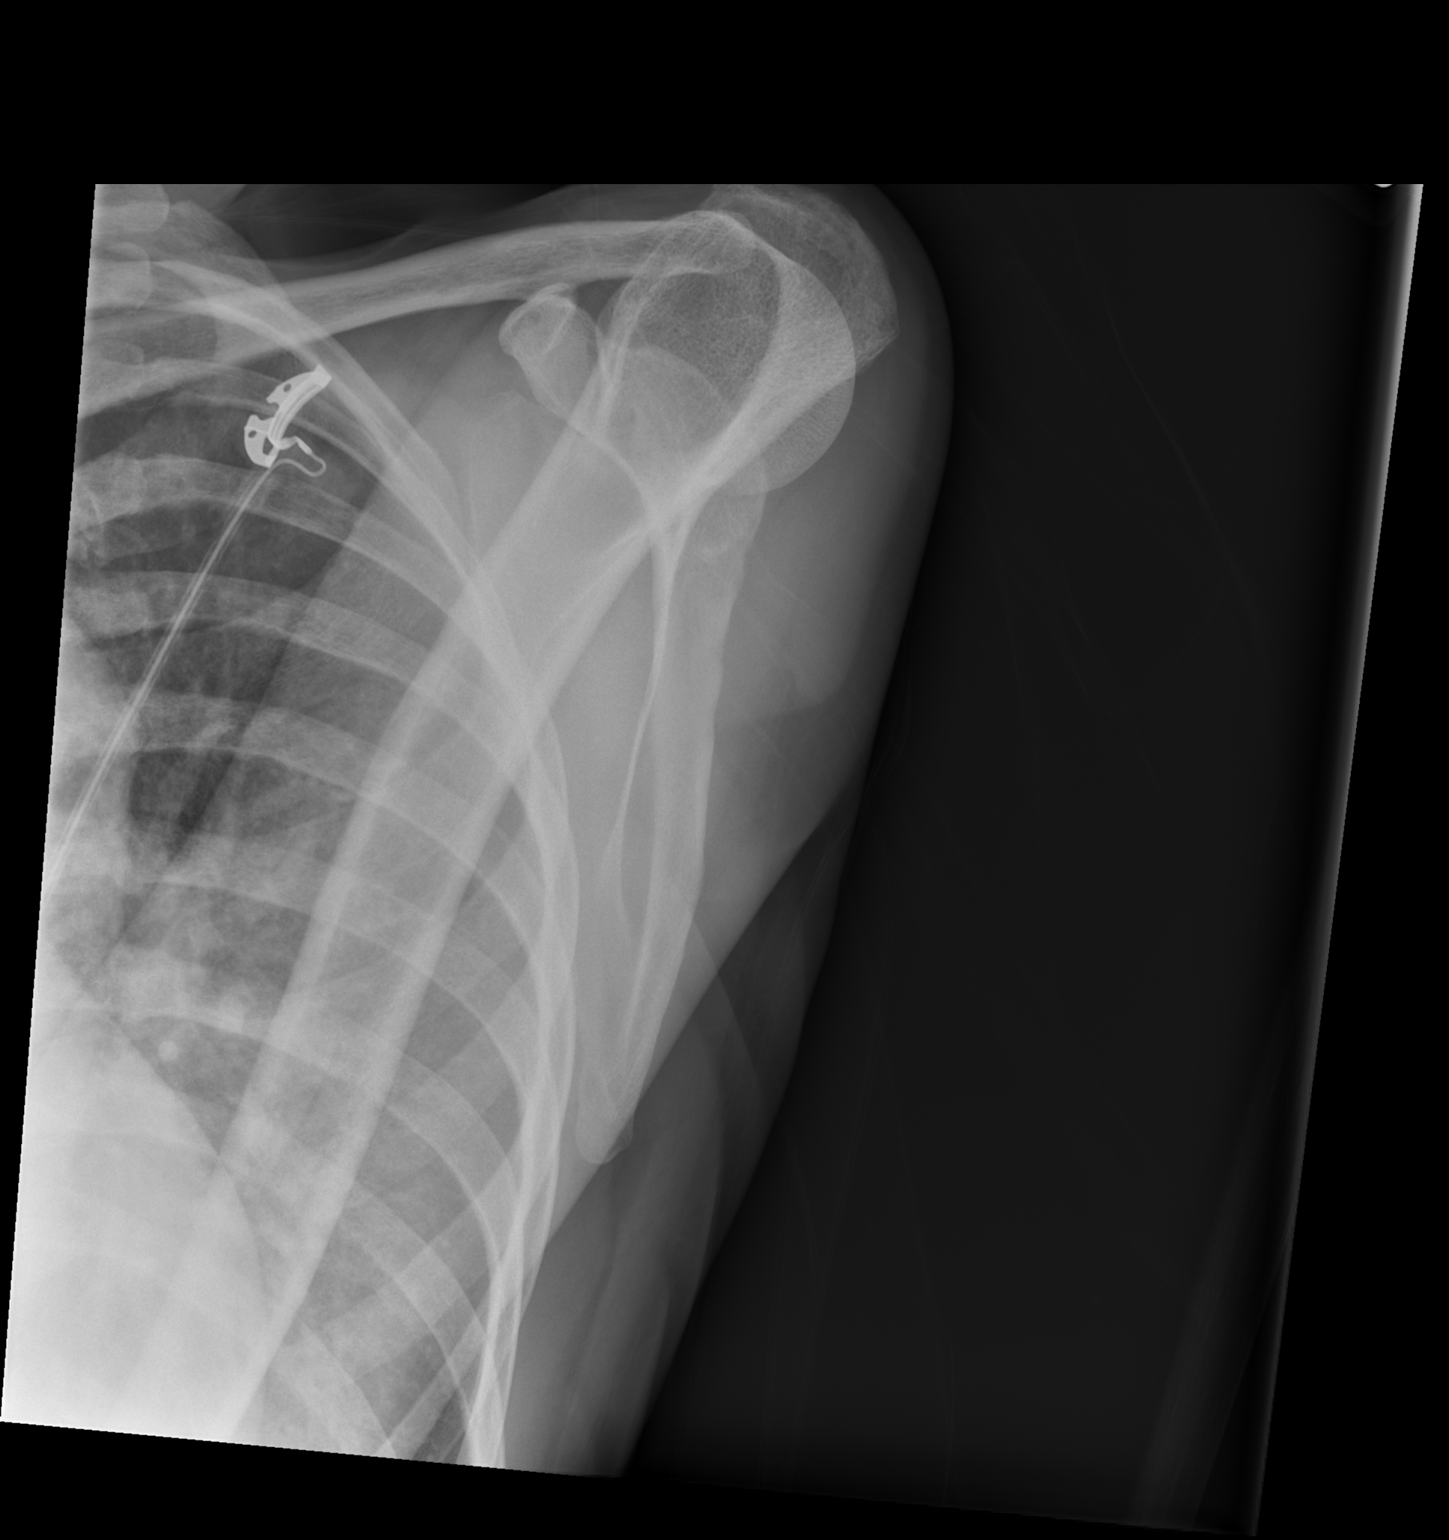

[2 of 2 positions shown; findings below may reference images not displayed]

FINDINGS: No acute fracture.  No dislocation.  No destructive bone lesion.
IMPRESSION: No acute bony pathology.

## 2015-07-27 IMAGING — CR DG CHEST 2V
2 series · 2 of 2 positions shown · non-contrast
Comparison: 06/25/2013

CLINICAL DATA: Cough, weakness, history of breast cancer

EXAM:
CHEST  2 VIEW

[w chest pa]
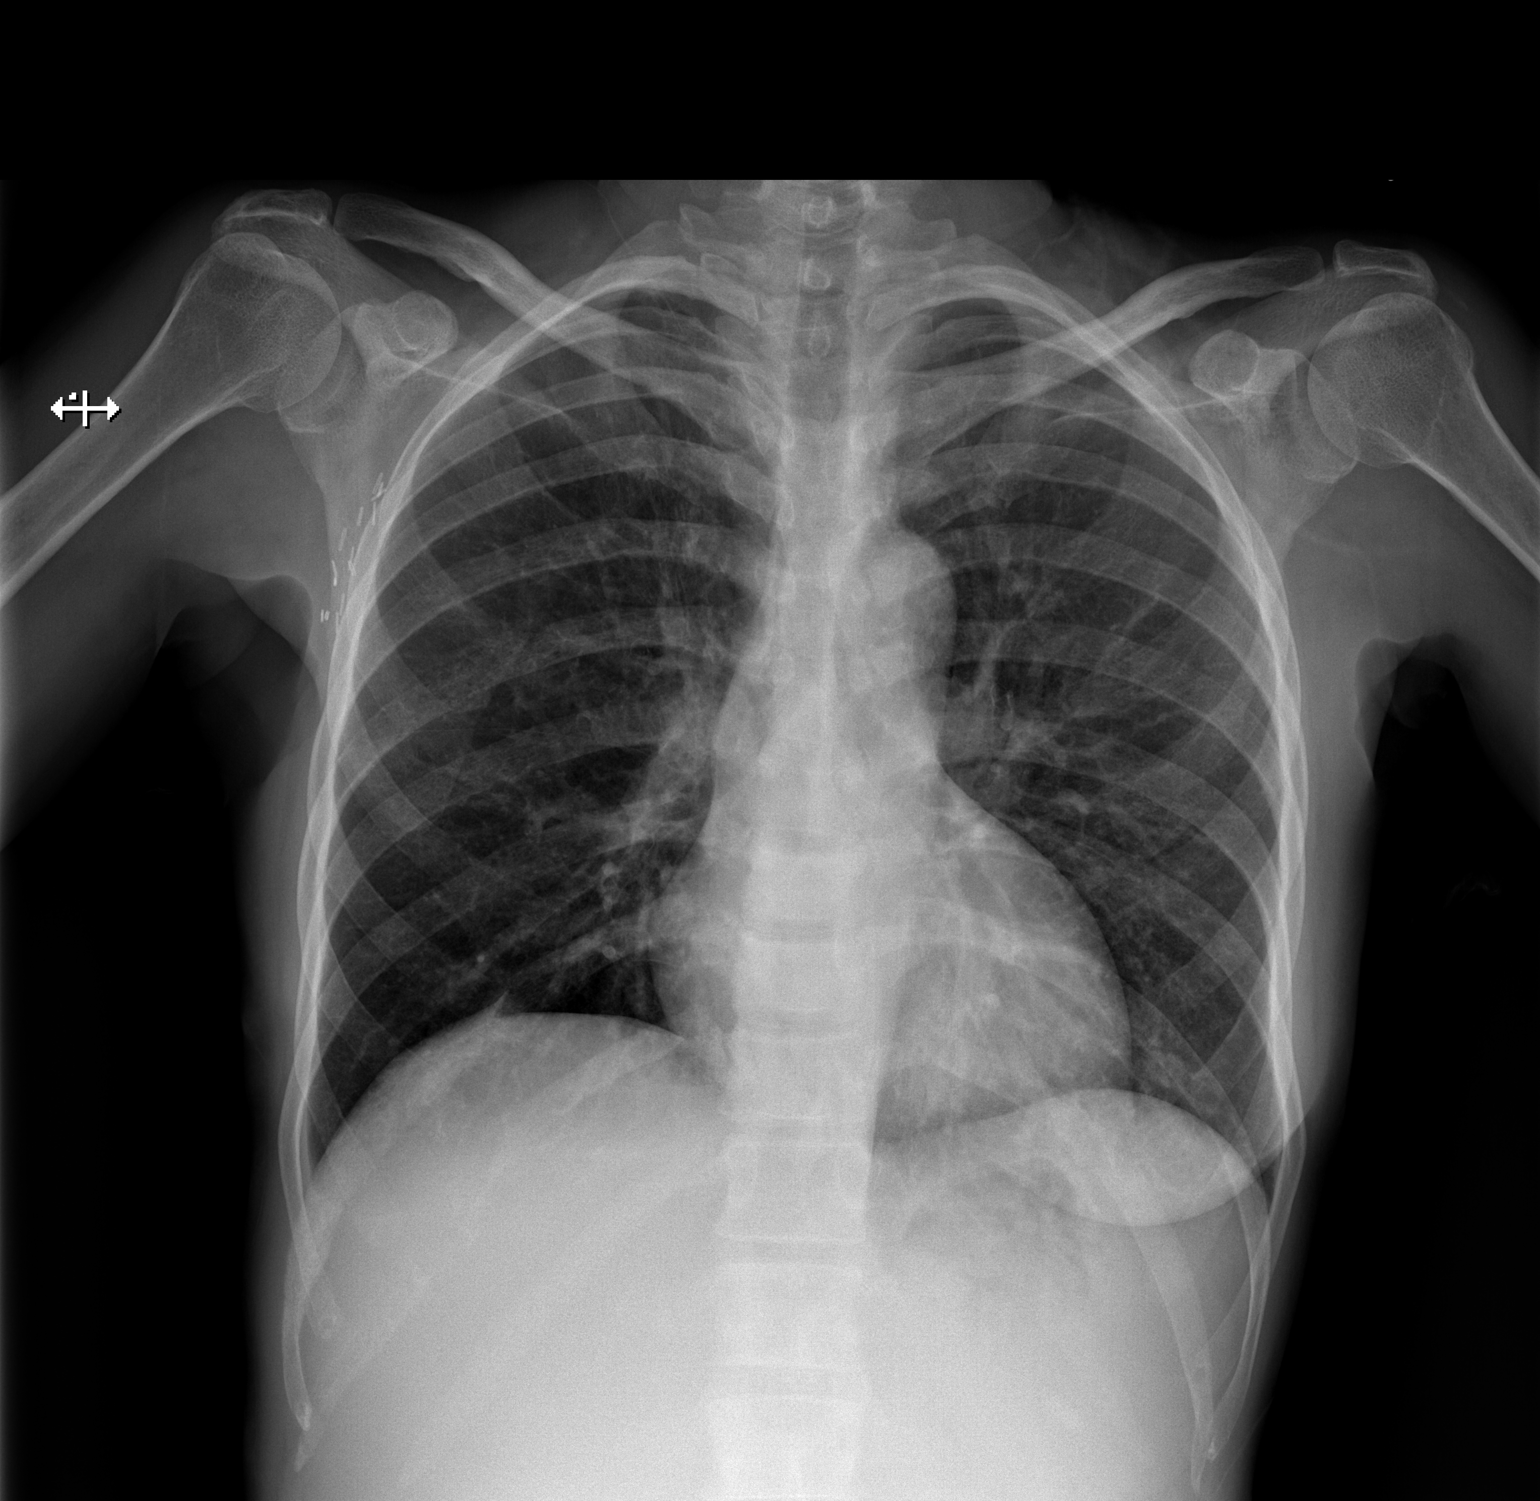

[w chest lat]
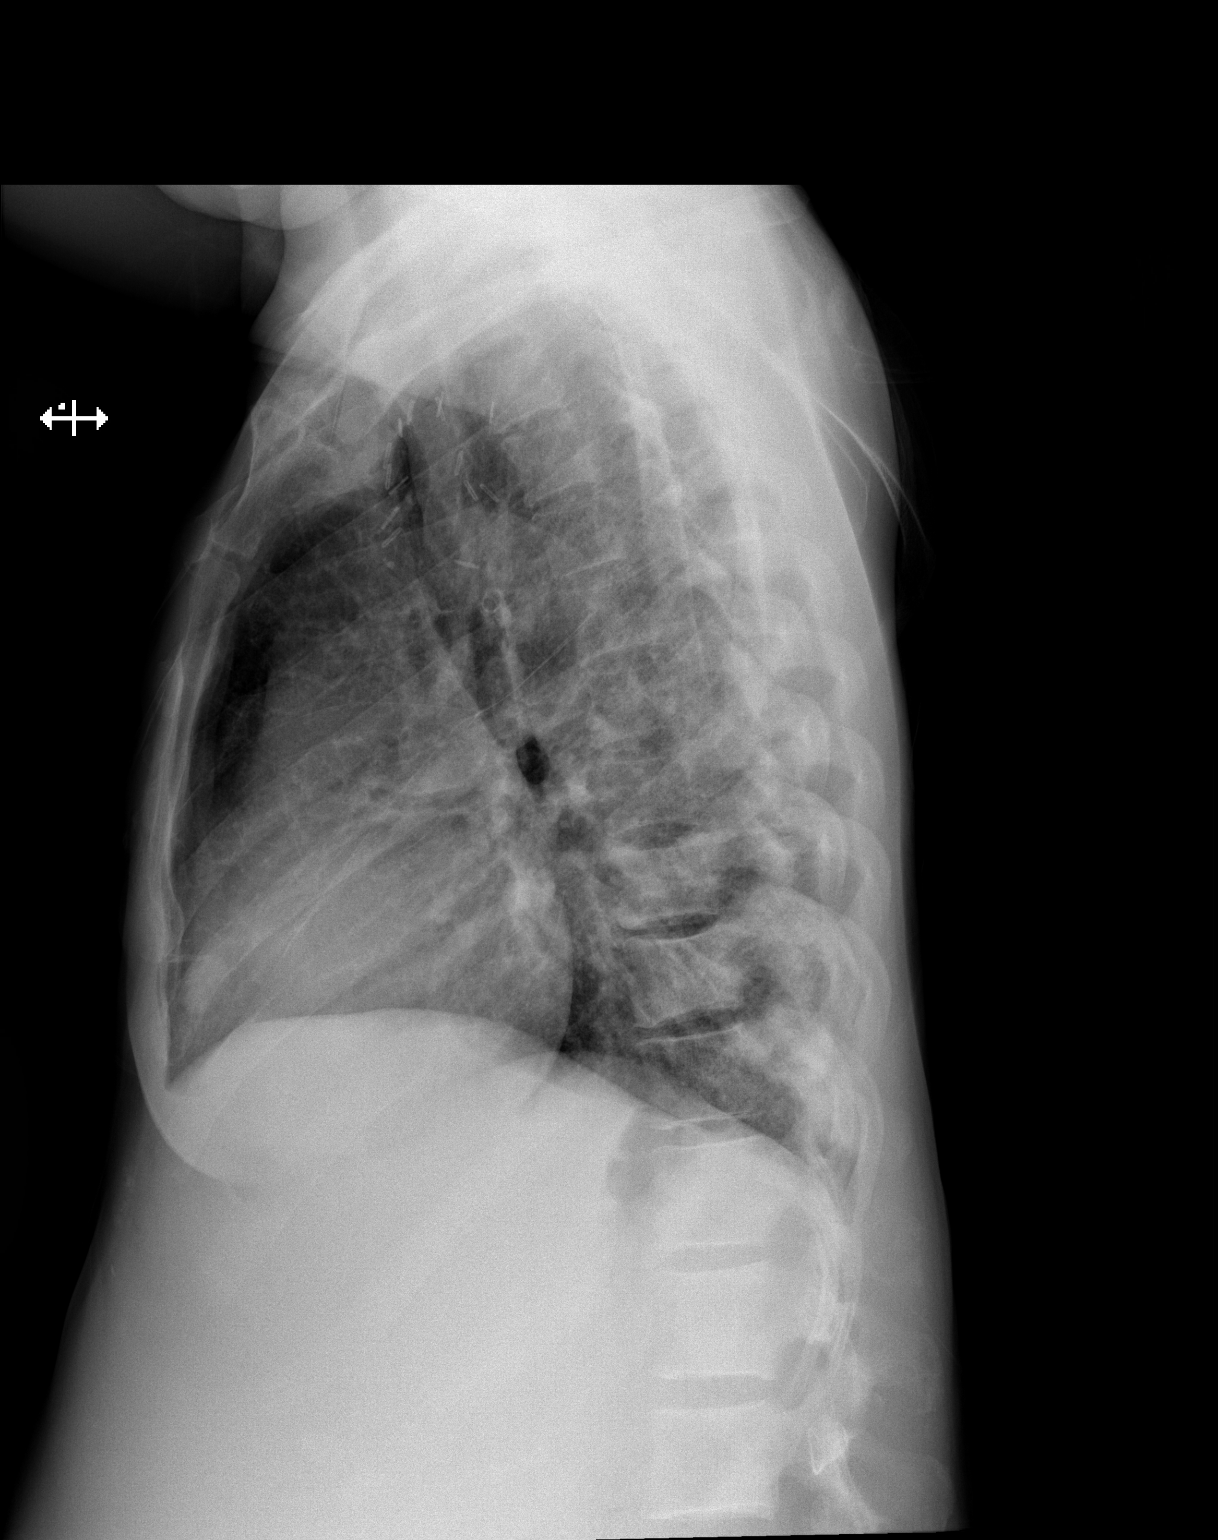

[2 of 2 positions shown; findings below may reference images not displayed]

FINDINGS: Prior right mastectomy and axillary lymph node dissection. Normal
heart size and vascularity. Minor streaky basilar atelectasis. No
edema, pneumonia, effusion pneumothorax. Trachea midline.
IMPRESSION: Postop changes.  Basilar atelectasis.
